# Patient Record
Sex: Male | Born: 1965
Health system: Southern US, Community
[De-identification: ages and names within clinical notes are randomized; demographics above are authoritative.]

## PROBLEM LIST (undated history)

## (undated) ENCOUNTER — Inpatient Hospital Stay: Payer: Self-pay | Admitting: *Deleted

## (undated) DIAGNOSIS — G4730 Sleep apnea, unspecified: Secondary | ICD-10-CM

## (undated) DIAGNOSIS — E2740 Unspecified adrenocortical insufficiency: Secondary | ICD-10-CM

## (undated) DIAGNOSIS — I5032 Chronic diastolic (congestive) heart failure: Secondary | ICD-10-CM

## (undated) DIAGNOSIS — Z923 Personal history of irradiation: Secondary | ICD-10-CM

## (undated) DIAGNOSIS — Z7189 Other specified counseling: Secondary | ICD-10-CM

## (undated) DIAGNOSIS — C4371 Malignant melanoma of right lower limb, including hip: Secondary | ICD-10-CM

## (undated) DIAGNOSIS — E785 Hyperlipidemia, unspecified: Secondary | ICD-10-CM

## (undated) DIAGNOSIS — M069 Rheumatoid arthritis, unspecified: Secondary | ICD-10-CM

## (undated) DIAGNOSIS — M109 Gout, unspecified: Secondary | ICD-10-CM

## (undated) DIAGNOSIS — I471 Supraventricular tachycardia: Secondary | ICD-10-CM

## (undated) DIAGNOSIS — F419 Anxiety disorder, unspecified: Secondary | ICD-10-CM

## (undated) DIAGNOSIS — T8859XA Other complications of anesthesia, initial encounter: Secondary | ICD-10-CM

## (undated) DIAGNOSIS — Z87442 Personal history of urinary calculi: Secondary | ICD-10-CM

## (undated) DIAGNOSIS — T4145XA Adverse effect of unspecified anesthetic, initial encounter: Secondary | ICD-10-CM

## (undated) HISTORY — PX: ABLATION: SHX5711

## (undated) HISTORY — DX: Supraventricular tachycardia: I47.1

## (undated) HISTORY — DX: Malignant melanoma of right lower limb, including hip: C43.71

## (undated) HISTORY — DX: Anxiety disorder, unspecified: F41.9

## (undated) HISTORY — PX: OTHER SURGICAL HISTORY: SHX169

## (undated) HISTORY — PX: EXTRACORPOREAL SHOCK WAVE LITHOTRIPSY: SHX1557

## (undated) HISTORY — PX: CHOLECYSTECTOMY: SHX55

## (undated) HISTORY — DX: Rheumatoid arthritis, unspecified: M06.9

---
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
## 2008-06-20 HISTORY — PX: ROTATOR CUFF REPAIR: SHX139

## 2012-03-16 ENCOUNTER — Encounter: Payer: Self-pay | Admitting: Internal Medicine

## 2012-05-24 ENCOUNTER — Encounter: Payer: Self-pay | Admitting: Internal Medicine

## 2012-05-24 ENCOUNTER — Telehealth: Payer: Self-pay | Admitting: *Deleted

## 2012-05-24 ENCOUNTER — Ambulatory Visit (INDEPENDENT_AMBULATORY_CARE_PROVIDER_SITE_OTHER): Payer: BC Managed Care – PPO | Admitting: Internal Medicine

## 2012-05-24 VITALS — BP 116/78 | HR 69 | Resp 18 | Ht 72.0 in | Wt 265.0 lb

## 2012-05-24 DIAGNOSIS — R0789 Other chest pain: Secondary | ICD-10-CM

## 2012-05-24 DIAGNOSIS — F419 Anxiety disorder, unspecified: Secondary | ICD-10-CM

## 2012-05-24 DIAGNOSIS — I471 Supraventricular tachycardia, unspecified: Secondary | ICD-10-CM

## 2012-05-24 DIAGNOSIS — R002 Palpitations: Secondary | ICD-10-CM

## 2012-05-24 DIAGNOSIS — F411 Generalized anxiety disorder: Secondary | ICD-10-CM

## 2012-05-24 HISTORY — DX: Supraventricular tachycardia: I47.1

## 2012-05-24 HISTORY — DX: Anxiety disorder, unspecified: F41.9

## 2012-05-24 HISTORY — DX: Supraventricular tachycardia, unspecified: I47.10

## 2012-05-24 NOTE — Progress Notes (Signed)
ELECTROPHYSIOLOGY CONSULT NOTE  Patient ID: James Byrd, MRN: [id number], DOB/AGE: 1966/06/13 46 y.o. Admit date: (Not on file) Date of Consult: 05/24/2012  Primary [address] Primary Cardiologist: none  Chief Complaint:  Chest pressure   HPI James Byrd is a 46 y.o. male  sees the request of Dr. Lorin Mercy because of ongoing problems with palpitations and pressure.  He has a 10 year history of episodes of chest discomfort which is limited to the middle of his chest associated with a balloon-like sensation lasting minutes and is unrelated to exertion. He has had a series of stress tests, treadmills, which has been unenlightening until September when he had an episode where his heart rate persisted at 190 beats per minute and then resolved.  Initially the patient to be that the sensation that he had at the treadmill, i.e. tachypalpitations, there is a reversible accompaniment of chest pressure. They were sitting in the room, he notes that he has recurrent chest pressure and I take his pulse and his heart is not racing.  There is no associated lightheadedness or diaphoresis with these spells. He has no significant exercise intolerance. He denies peripheral edema. He's had no syncope.  His electrocardiogram demonstrates Q waves V1-V3; no echocardiogram has been done. No past medical history on file.    Surgical History:  Past Surgical History  Procedure Date  . Rotator cuff repair 2010    3 yrs ago     Home Meds: Prior to Admission medications   Medication Sig Start Date End Date Taking? Authorizing Provider  Cholecalciferol (VITAMIN D) 2000 UNITS CAPS Take 2 capsules by mouth 2 (two) times daily.   Yes Historical Provider, MD  Coenzyme Q10 (CO Q 10) 10 MG CAPS Take by mouth.   Yes Historical Provider, MD  Cyanocobalamin (VITAMIN B 12 PO) Take by mouth.   Yes Historical Provider, MD  fish oil-omega-3 fatty acids 1000 MG capsule Take 2 g by mouth daily.   Yes  Historical Provider, MD  metoprolol succinate (TOPROL-XL) 50 MG 24 hr tablet Take 25 mg by mouth daily. Take with or immediately following a meal.   Yes Historical Provider, MD  Potassium (POTASSIMIN) 75 MG TABS Take 1 tablet by mouth daily.   Yes Historical Provider, MD      Allergies: No Known Allergies  History   Social History  . Marital Status: Married    Spouse Name: N/A    Number of Children: N/A  . Years of Education: N/A   Occupational History  . Not on file.   Social History Main Topics  . Smoking status: Former Research scientist (life sciences)  . Smokeless tobacco: Not on file  . Alcohol Use: Not on file  . Drug Use: Not on file  . Sexually Active: Not on file   Other Topics Concern  . Not on file   Social History Narrative  . No narrative on file     No family history on file.   ROS:  Please see the history of present illness.   Negative except stress  All other systems reviewed and negative.    Physical Exam:  Blood pressure 116/78, pulse 69, resp. rate 18, height 6' (1.829 m), weight 265 lb (120.203 kg). General: Well developed, well nourished male in no acute distress. Head: Normocephalic, atraumatic, sclera non-icteric, no xanthomas, nares are without discharge. Lymph Nodes:  none Back: without scoliosis/kyphosis , no CVA tendersness Neck: Negative for carotid bruits. JVD not elevated. Lungs: Clear bilaterally to auscultation without wheezes,  rales, or rhonchi. Breathing is unlabored. Heart: RRR with S1 S2. No murmur , rubs, or gallops appreciated. Abdomen: Soft, non-tender, non-distended with normoactive bowel sounds. No hepatomegaly. No rebound/guarding. No obvious abdominal masses. Msk:  Strength and tone appear normal for age. Extremities: No clubbing or cyanosis. No edema.  Distal pedal pulses are 2+ and equal bilaterally. Skin: Warm and Dry Neuro: Alert and oriented X 3. CN III-XII intact Grossly normal sensory and motor function . Psych:  Responds to questions  appropriately with an emotionally labile affect      L   EKG: Sinus rhythm at 68 Intervals 21/10/39 Axis 70 Q waves V1-V3   Assessment and Plan:   James Byrd

## 2012-05-24 NOTE — Assessment & Plan Note (Signed)
As above.

## 2012-05-24 NOTE — Patient Instructions (Signed)
Your physician has requested that you have an echocardiogram. Echocardiography is a painless test that uses sound waves to create images of your heart. It provides your doctor with information about the size and shape of your heart and how well your heart's chambers and valves are working. This procedure takes approximately one hour. There are no restrictions for this procedure.  Your physician has recommended that you wear a 30 day single event patient activated monitor. Event monitors are medical devices that record the heart's electrical activity. Doctors most often [place] these monitors to diagnose arrhythmias. Arrhythmias are problems with the speed or rhythm of the heartbeat. The monitor is a small, portable device. You can wear one while you do your normal daily activities. This is usually used to diagnose what is causing palpitations/syncope (passing out).  Your physician recommends that you schedule a follow-up appointment in: 6-8 weeks with Dr. Caryl Comes.

## 2012-05-24 NOTE — Telephone Encounter (Signed)
S/W patient, prefers monitor to be mailed to home address.  Pt enrolled 05/24/12.

## 2012-05-24 NOTE — Assessment & Plan Note (Signed)
The patient has stress issues related to work future and his son. I'm not sure how this plays into his symptom complex

## 2012-05-24 NOTE — Assessment & Plan Note (Signed)
This is described in the treadmill report. Initially his history suggested that t symptoms associated with that specific episode were universally associated with this chest pressure. It clearly is not the case. I'm not sure how frequently the SVT occurs or when this mechanism is. We will start by having him get the monitor from [facility] we'll also use a 30 day event recorder to try to clarify the rhythms associated with chest pressure and fluttering

## 2012-05-24 NOTE — Assessment & Plan Note (Signed)
He has an abnormal ECG suggestive of anteroseptal MI. Apparently none of this stress testing has been done with imaging. We'll undertake an echo. It may also be a component of this is GI in origin.

## 2012-06-01 ENCOUNTER — Ambulatory Visit (HOSPITAL_COMMUNITY): Payer: BC Managed Care – PPO | Attending: Internal Medicine | Admitting: Radiology

## 2012-06-01 DIAGNOSIS — I369 Nonrheumatic tricuspid valve disorder, unspecified: Secondary | ICD-10-CM | POA: Insufficient documentation

## 2012-06-01 DIAGNOSIS — I059 Rheumatic mitral valve disease, unspecified: Secondary | ICD-10-CM | POA: Insufficient documentation

## 2012-06-01 DIAGNOSIS — R0609 Other forms of dyspnea: Secondary | ICD-10-CM | POA: Insufficient documentation

## 2012-06-01 DIAGNOSIS — I471 Supraventricular tachycardia, unspecified: Secondary | ICD-10-CM | POA: Insufficient documentation

## 2012-06-01 DIAGNOSIS — R0989 Other specified symptoms and signs involving the circulatory and respiratory systems: Secondary | ICD-10-CM | POA: Insufficient documentation

## 2012-06-01 DIAGNOSIS — R002 Palpitations: Secondary | ICD-10-CM

## 2012-06-01 NOTE — Progress Notes (Signed)
Echocardiogram performed.  

## 2012-06-15 NOTE — Addendum Note (Signed)
Addended by: Lubertha Basque A on: 06/15/2012 04:52 PM   Modules accepted: Orders

## 2012-06-19 ENCOUNTER — Telehealth: Payer: Self-pay | Admitting: *Deleted

## 2012-06-19 NOTE — Telephone Encounter (Signed)
Patient called back. Notified of echo results.  Pt concerned because since taking Metoprolol he has been having problems with increased shortness of breath and his temper has been much shorter than usual.  He was also having problems of fatigue but was able to deal with this by taking 1/2 tablet of Metoprolol twice daily as opposed to 1 tablet daily.  Pt states he has called in strips also via his event monitor.  Advised patient to try to cut Metoprolol back to 1/2 tablet in the evening only to see if that helps with mood and shortness of breath.  Patient is aware that Dr Caryl Comes will be in Thursday to review charts.  Will discuss further with him at that time.  Pt aware and agrees with plan.

## 2012-06-19 NOTE — Telephone Encounter (Signed)
Left message for patient to call [id number] results.  According to [facility], event monitor was ordered 05-24-2012.  Want to verify that pt is wearing that.  Also need to verify pt is aware of follow up appt with Dr Caryl Comes in January.

## 2012-06-19 NOTE — Telephone Encounter (Signed)
Message copied by Patsey Berthold on Tue Jun 19, 2012  3:24 PM ------      Message from: Deboraha Sprang      Created: Fri Jun 08, 2012 10:47 AM       Please Inform Patient Normal echo except mild atral dilitation will follow that

## 2012-06-21 NOTE — Telephone Encounter (Signed)
Left message for patient to call.  D/W Dr Caryl Comes- event monitor shows SR, occasional PVC's.  If pt still not tolerating decreased dose of Metoprolol, okay to change to a different beta blocker.

## 2012-06-21 NOTE — Telephone Encounter (Signed)
Follow-up:    Patient called returning your call.  Please call back.

## 2012-06-22 ENCOUNTER — Telehealth: Payer: Self-pay | Admitting: Internal Medicine

## 2012-06-22 NOTE — Telephone Encounter (Signed)
New Problem:    Called in stating that they need a written request for the entire patient's stress test.  Please call back.

## 2012-06-22 NOTE — Telephone Encounter (Signed)
Spoke with patient.  He is feeling a little better with decreased dose of Metoprolol.  Advised about event results to date.  Advised Dr Caryl Comes is requesting full ECG report from stress test at [facility].  Pt will bring to appt 07-19-12.  Pt will call back with further questions.

## 2012-06-25 NOTE — Telephone Encounter (Signed)
Spoke with medical records.  They will assist patient in requesting records from [facility].

## 2012-06-25 NOTE — Telephone Encounter (Signed)
F/U   Returning call back to nurse [employer]

## 2012-06-27 ENCOUNTER — Telehealth: Payer: Self-pay | Admitting: Internal Medicine

## 2012-06-27 DIAGNOSIS — R002 Palpitations: Secondary | ICD-10-CM

## 2012-06-27 MED ORDER — ATENOLOL 25 MG PO TABS: ORAL_TABLET | ORAL | Status: DC

## 2012-06-27 NOTE — Telephone Encounter (Signed)
Pt not  doing well on decreased dose of metoprolol has no energy, still having constant pvc's, does not want to increase the dose of metoprolol does not like it, pls advise @ [phone]

## 2012-06-27 NOTE — Telephone Encounter (Signed)
Will review with  Dr. Caryl Comes prior to calling the patient to see if he recommends a different beta blocker to try. Records pulled for MD review.

## 2012-06-27 NOTE — Telephone Encounter (Signed)
Per Dr. Caryl Comes, GXT strips reviewed. Ok to switch to atenolol 25 mg once daily. I have explained this to the patient and he is aware and agreeable. I have asked that he give it at least 2 weeks on the atenolol as a trial.

## 2012-07-19 ENCOUNTER — Ambulatory Visit (INDEPENDENT_AMBULATORY_CARE_PROVIDER_SITE_OTHER): Payer: BC Managed Care – PPO | Admitting: Internal Medicine

## 2012-07-19 ENCOUNTER — Encounter: Payer: Self-pay | Admitting: Internal Medicine

## 2012-07-19 VITALS — BP 131/80 | HR 71 | Ht 72.0 in | Wt 269.4 lb

## 2012-07-19 DIAGNOSIS — R002 Palpitations: Secondary | ICD-10-CM

## 2012-07-19 DIAGNOSIS — F411 Generalized anxiety disorder: Secondary | ICD-10-CM

## 2012-07-19 DIAGNOSIS — G4733 Obstructive sleep apnea (adult) (pediatric): Secondary | ICD-10-CM

## 2012-07-19 DIAGNOSIS — F419 Anxiety disorder, unspecified: Secondary | ICD-10-CM

## 2012-07-19 DIAGNOSIS — I471 Supraventricular tachycardia: Secondary | ICD-10-CM

## 2012-07-19 NOTE — Patient Instructions (Signed)
Your physician has recommended you make the following change in your medication:  1) Stop atenolol.  Call Dr. Olin Pia nurse in 2 weeks and let us know how you are doing off atenolol.  Your physician wants you to follow-up in: 6 months with Dr. Caryl Comes. You will receive a reminder letter in the mail two months in advance. If you don't receive a letter, please call our office to schedule the follow-up appointment.

## 2012-07-19 NOTE — Progress Notes (Signed)
Patient has no care team.   HPI  James Byrd is a 47 y.o. male Seen in followup for palpitations last seen in 12/13 . He also had abnormal ECG suggestive of anteroseptal MI. Echocardiogram  12/13 was normal  He reportedly had SVT documented during a treadmill. We've been able to get to strips. The tachycardia is noted in the recovery phase likely related to a concealed accessory pathway.  This was asymptomatic. He was started on metoprolol. This was associated with irritability. We switched at his last visit to atenolol. This  has been much better.  He does complain however that now when he bends over to tie shoes he short of breath. He also notes shortness of breath without warning with a dog.  He also notes that work has been much more stressful. There've been some irritability.  Past Medical History  Diagnosis Date  . Paroxysmal supraventricular tachycardia 05/24/2012    Described in the treadmill report; details are pending   . Anxiety 05/24/2012    Past Surgical History  Procedure Date  . Rotator cuff repair 2010    3 yrs ago    Current Outpatient Prescriptions  Medication Sig Dispense Refill  . atenolol (TENORMIN) 25 MG tablet Take one tablet by mouth daily  180 tablet  3  . Coenzyme Q10 (CO Q 10) 10 MG CAPS Take by mouth.      . Cyanocobalamin (VITAMIN B 12 PO) Take by mouth.      . fish oil-omega-3 fatty acids 1000 MG capsule Take 2 g by mouth daily.      . Potassium (POTASSIMIN) 75 MG TABS Take 1 tablet by mouth daily.        No Known Allergies  Review of Systems negative except from HPI and PMH  Physical Exam BP 131/80  Pulse 71  Ht 6' (1.829 m)  Wt 269 lb 6.4 oz (122.199 kg)  BMI 36.54 kg/m2 Well developed and nourished in no acute distress HENT normal Neck supple with JVP-flat Clear Regular rate and rhythm, no murmurs or gallops Abd-soft with active BS No Clubbing cyanosis edema Skin-warm and dry A & Oriented  Grossly normal sensory and motor  function  Strips were reviewed demonstrating the onset of narrow QRS tachycardia without PR prolongation consistent with a concealed accessory pathway   Assessment and  Plan

## 2012-07-19 NOTE — Assessment & Plan Note (Signed)
As above.

## 2012-07-19 NOTE — Assessment & Plan Note (Signed)
As above I suspect some of his irritability may be related to depression and stress secondary to his new job. We'll see how he does office of his beta blocker

## 2012-07-19 NOTE — Assessment & Plan Note (Signed)
The patient has significant snoring and apnea. He also has daytime somnolence. He needs a sleep study. Of

## 2012-07-19 NOTE — Assessment & Plan Note (Signed)
Patient's tachycardia was asymptomatic. Hence we will discontinue his atenolol as it may be contributing to his shortness of breath and irritability. Event recorder failed to demonstrate any significant other arrhythmia

## 2012-08-01 ENCOUNTER — Other Ambulatory Visit: Payer: Self-pay | Admitting: *Deleted

## 2012-08-01 DIAGNOSIS — R002 Palpitations: Secondary | ICD-10-CM

## 2014-06-20 HISTORY — PX: EXTRACORPOREAL SHOCK WAVE LITHOTRIPSY: SHX1557

## 2014-11-13 ENCOUNTER — Telehealth: Payer: Self-pay | Admitting: Hematology & Oncology

## 2014-11-13 NOTE — Telephone Encounter (Signed)
Left vm w NEW PATIENT today to remind them of their appointment with Dr. Ennever. Also, advised them to bring all medication bottles and insurance card information. ° °

## 2014-11-14 ENCOUNTER — Encounter: Payer: Self-pay | Admitting: Hematology & Oncology

## 2014-11-14 ENCOUNTER — Ambulatory Visit (HOSPITAL_BASED_OUTPATIENT_CLINIC_OR_DEPARTMENT_OTHER): Payer: BLUE CROSS/BLUE SHIELD | Admitting: Hematology & Oncology

## 2014-11-14 ENCOUNTER — Ambulatory Visit: Payer: BLUE CROSS/BLUE SHIELD

## 2014-11-14 ENCOUNTER — Other Ambulatory Visit (HOSPITAL_BASED_OUTPATIENT_CLINIC_OR_DEPARTMENT_OTHER): Payer: BLUE CROSS/BLUE SHIELD

## 2014-11-14 VITALS — BP 119/75 | HR 76 | Temp 97.9°F | Resp 18 | Wt 274.0 lb

## 2014-11-14 DIAGNOSIS — C4371 Malignant melanoma of right lower limb, including hip: Secondary | ICD-10-CM

## 2014-11-14 HISTORY — DX: Malignant melanoma of right lower limb, including hip: C43.71

## 2014-11-14 LAB — CMP (CANCER CENTER ONLY)
ALT(SGPT): 31 U/L (ref 10–47)
AST: 20 U/L (ref 11–38)
Albumin: 4.1 g/dL (ref 3.3–5.5)
Alkaline Phosphatase: 51 U/L (ref 26–84)
BUN: 17 mg/dL (ref 7–22)
CALCIUM: 9.4 mg/dL (ref 8.0–10.3)
CHLORIDE: 106 meq/L (ref 98–108)
CO2: 27 mEq/L (ref 18–33)
CREATININE: 0.5 mg/dL — AB (ref 0.6–1.2)
GLUCOSE: 92 mg/dL (ref 73–118)
Potassium: 3.9 mEq/L (ref 3.3–4.7)
Sodium: 142 mEq/L (ref 128–145)
TOTAL PROTEIN: 7.2 g/dL (ref 6.4–8.1)
Total Bilirubin: 0.9 mg/dl (ref 0.20–1.60)

## 2014-11-14 LAB — CBC WITH DIFFERENTIAL (CANCER CENTER ONLY)
BASO#: 0 10*3/uL (ref 0.0–0.2)
BASO%: 0.3 % (ref 0.0–2.0)
EOS ABS: 0.1 10*3/uL (ref 0.0–0.5)
EOS%: 2 % (ref 0.0–7.0)
HEMATOCRIT: 40.5 % (ref 38.7–49.9)
HGB: 14.4 g/dL (ref 13.0–17.1)
LYMPH#: 1.7 10*3/uL (ref 0.9–3.3)
LYMPH%: 25.8 % (ref 14.0–48.0)
MCH: 32.7 pg (ref 28.0–33.4)
MCHC: 35.6 g/dL (ref 32.0–35.9)
MCV: 92 fL (ref 82–98)
MONO#: 0.7 10*3/uL (ref 0.1–0.9)
MONO%: 10.4 % (ref 0.0–13.0)
NEUT#: 4.1 10*3/uL (ref 1.5–6.5)
NEUT%: 61.5 % (ref 40.0–80.0)
PLATELETS: 211 10*3/uL (ref 145–400)
RBC: 4.41 10*6/uL (ref 4.20–5.70)
RDW: 12.7 % (ref 11.1–15.7)
WBC: 6.7 10*3/uL (ref 4.0–10.0)

## 2014-11-14 LAB — LACTATE DEHYDROGENASE: LDH: 163 U/L (ref 94–250)

## 2014-11-14 NOTE — Progress Notes (Signed)
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 : I had my right toe taken off because of melanoma.  HPI: Mr. Chilcoat is a very nice 49 year old white gentleman. He's been in very good health. He had an episode of paroxysmal SVT. This was back in 2014. He currently is on diltiazem.  He noted a discoloration under the nail bed of his right great toe about a year ago. It was nontender. He had no swelling area and he was treated for a fungal infection. This did not improve the discoloration.  The discoloration worsened. The nail bed began to crack.  He was then in to see a podiatrist. The podiatrist took off the toenail and did a biopsy. Shockingly enough, the biopsy report showed a malignant melanoma. The Breslow depth was 2.65 mm. There was vascular invasion. The margins were involved.  The biopsy was done in March 2018.  He subsequently went to [facility]. He did see a surgeon there. The surgeon went ahead and did a amputation of the right great toe. The pathology report from [facility] showed that he did have a sentinel lymph node that was positive.  He did have CT of the chest abdomen and pelvis prior to surgery. This was negative for any obvious metastatic disease. He did have a left hepatic lobe lesion which was felt to be a cyst.  By his pathology, he is stage IIIB [id number]) melanoma.  He was told by the surgeon that he did not need anything else done.  He did see one of the oncologists at about his. This was before he had the amputation and sentinel node biopsy. He has not gone back to see him yet.  He has seen Dr. Hinton Rao at [facility]. She recommended adjuvant Yervoy.  I take care of one of their friends. They wished to have a second opinion.  He is in good health. He is still working. He does vending machines. He has had no weight loss or weight gain. He's had no fever sweats or chills.  He's had no abdominal pain. He's had no change in bowel or bladder habits.  He currently is out of work from his surgical procedures.  He does not smoke.  There is no history of melanoma in the family.  Overall, his performance status is ECOG 0.       Past Medical History  Diagnosis Date  . Paroxysmal supraventricular tachycardia 05/24/2012    Described in the treadmill report; details are pending   . Anxiety 05/24/2012  :  Past Surgical History  Procedure Laterality Date  . Rotator cuff repair  2010    3 yrs ago  :   Current outpatient prescriptions:  .  aspirin 81 MG tablet, Take 81 mg by mouth daily., Disp: , Rfl:  .  diltiazem (CARDIZEM) 120 MG tablet, Take 120 mg by mouth daily., Disp: , Rfl:  .  Multiple Vitamins-Minerals (MENS ONE DAILY PO), Take by mouth., Disp: , Rfl:  .  pravastatin (PRAVACHOL) 40 MG tablet, Take 40 mg by mouth daily., Disp: , Rfl: :  :  No Known Allergies:  History reviewed. No pertinent family history.:  History   Social History  . Marital Status: Married    Spouse Name: N/A  . Number of Children: N/A  . Years of Education: N/A   Occupational History  . Not on file.   Social History Main Topics  .  Smoking status: Never Smoker   . Smokeless tobacco: Not on file  . Alcohol Use: Not on file  . Drug Use: Not on file  . Sexual Activity: Not on file   Other Topics Concern  . Not on file   Social History Narrative  :  Pertinent items are noted in HPI.  Exam: @IPVITALS @ Well developed and well-nourished white gentleman in no obvious distress. Vital signs show temperature of 97.9. Pulse 76. Blood pressure 119/75. Weight is 274 pounds. Head and neck exam shows no ocular or oral lesions. He has no palpable cervical or supraclavicular lymph nodes. Lungs are clear bilaterally. Cardiac exam regular rate and rhythm with no murmurs, rubs or bruits. Abdomen is soft. He has good bowel sounds. There is no fluid wave. There is no palpable  liver or spleen tip. Back exam shows no tenderness over the spine, ribs or hips. Inguinal exam shows the right sentinel lymph node biopsy. This is healing. Extremities shows the disarticulation of the right hallux. This is healing. No suspicious lesions are noted on his legs. Skin exam shows no hyperpigmented lesions that were suspicious. Neurological exam shows no focal neurological deficits.    Recent Labs  11/14/14 1511  WBC 6.7  HGB 14.4  HCT 40.5  PLT 211    Recent Labs  11/14/14 1512  NA 142  K 3.9  CL 106  CO2 27  GLUCOSE 92  BUN 17  CREATININE 0.5*  CALCIUM 9.4    Blood smear review:  none   Pathology: see above   Assessment and Plan:  Mr. Herndon is a really nice 49 year old gentleman. He has a stage IIIB subungual melanoma of the right great toe. He had this capitated. He had a sentinel lymph node with microscopic involvement.  I spent over an hour with he and his wife. I told him that in general, subungual melanomas tend to have a worse prognosis because they tend to be diagnosed later on in there course. This is exactly how it happened for Mr. Ajello. He said he had this lesion for about a year.  I told Mr. Szeto that he is healthy area and I totally agree with Dr. Hinton Rao about the role of adjuvant therapy.  I don't see that he needs to have a completion lymphadenectomy. I think one of the recent melanoma lymph node trials did not show a benefit for the immediate lymphadenectomy in patients with positive sentinel nodes.  I agree with Dr. Hinton Rao that Curt Bears would be a very good idea and would be tolerable. I told him that they course of therapy would only be 12 weeks. I think this would be much easier for him than a year of interferon.  I told him that even with [facility], that there is still a risk that the melanoma can come back.  I went over the toxicities of treatment with Mr. Berthelsen and his wife. Dr. Hinton Rao has done a great job with this already.  He would like  to have his treatment in our office. He has a very strong faith. We gave him a prayer blanket.  We will try to get treatment started next week.  I will plan to see him back for his second cycle of treatment.  I told him that I would speak with the oncologist that he saw at [facility], Dr. Baltazar Najjar and let him know what we spoke about and see if there is any additional input that he would like to give [place].

## 2014-11-18 ENCOUNTER — Telehealth: Payer: Self-pay | Admitting: Hematology & Oncology

## 2014-11-18 NOTE — Telephone Encounter (Signed)
ipilimumab Curt Bears) [id number] - APPROVED w [employer]  I spoke w Danae Chen @ [phone]  Auth: [id number] Valid: 11/18/2014 - 11/18/2015   P: [id number] F: [id number]   COPY SCANNED

## 2014-11-21 ENCOUNTER — Ambulatory Visit (HOSPITAL_BASED_OUTPATIENT_CLINIC_OR_DEPARTMENT_OTHER): Payer: BLUE CROSS/BLUE SHIELD

## 2014-11-21 ENCOUNTER — Other Ambulatory Visit: Payer: Self-pay

## 2014-11-21 VITALS — BP 122/74 | HR 55 | Temp 97.9°F | Resp 18

## 2014-11-21 DIAGNOSIS — Z5112 Encounter for antineoplastic immunotherapy: Secondary | ICD-10-CM | POA: Diagnosis not present

## 2014-11-21 DIAGNOSIS — C4371 Malignant melanoma of right lower limb, including hip: Secondary | ICD-10-CM

## 2014-11-21 MED ORDER — SODIUM CHLORIDE 0.9 % IV SOLN
3.2000 mg/kg | Freq: Once | INTRAVENOUS | Status: AC
  Administered 2014-11-21: 400 mg via INTRAVENOUS
  Filled 2014-11-21: qty 80

## 2014-11-21 MED ORDER — LIDOCAINE-PRILOCAINE 2.5-2.5 % EX CREA: TOPICAL_CREAM | CUTANEOUS | Status: DC

## 2014-11-21 MED ORDER — PREDNISONE 10 MG PO TABS: ORAL_TABLET | ORAL | Status: DC

## 2014-11-21 MED ORDER — SODIUM CHLORIDE 0.9 % IV SOLN
Freq: Once | INTRAVENOUS | Status: AC
  Administered 2014-11-21: 12:00:00 via INTRAVENOUS

## 2014-11-21 NOTE — Patient Instructions (Addendum)
Ipilimumab injection What is this medicine? IPILIMUMAB (IP i LIM ue mab) is used to treat certain types of melanoma. This medicine may be used for other purposes; ask your health care provider or pharmacist if you have questions. COMMON BRAND NAME(S): YERVOY What should I tell my health care provider before I take this medicine? They need to know if you have any of these conditions: -Addison's disease -blood in your stools (black or tarry stools) or if you have blood in your vomit -eye disease, vision problems -history of pancreatitis -history of stomach bleeding -immune system problems -inflammatory bowel disease -kidney disease -liver disease -lupus -myasthenia gravis -organ transplant -rheumatoid arthritis -sarcoidosis -stomach or intestine problems -thyroid disease -tingling of the fingers or toes, or other nerve disorder -an unusual or allergic reaction to ipilimumab, other medicines, foods, dyes, or preservatives -pregnant or trying to get pregnant -breast-feeding How should I use this medicine? This medicine is for infusion into a vein. It is given by a health care professional in a hospital or clinic setting. A special MedGuide will be given to you before each treatment. Be sure to read this information carefully each time. Talk to your pediatrician regarding the use of this medicine in children. Special care may be needed. Overdosage: If you think you've taken too much of this medicine contact a poison control center or emergency room at once. Overdosage: If you think you have taken too much of this medicine contact a poison control center or emergency room at once. NOTE: This medicine is only for you. Do not share this medicine with others. What if I miss a dose? It is important not to miss your dose. Call your doctor or health care professional if you are unable to keep an appointment. What may interact with this medicine? Interactions are not expected. This list may  not describe all possible interactions. Give your health care provider a list of all the medicines, herbs, non-prescription drugs, or dietary supplements you use. Also tell them if you smoke, drink alcohol, or use illegal drugs. Some items may interact with your medicine. What should I watch for while using this medicine? Tell your doctor or healthcare professional if your symptoms do not start to get better or if they get worse. Your condition will be monitored carefully while you are receiving this medicine. You may need blood work done while you are taking this medicine. What side effects may I notice from receiving this medicine? Side effects that you should report to your doctor or health care professional as soon as possible: -allergic reactions like skin rash, itching or hives, swelling of the face, lips, or tongue -black, tarry stools -bloody or watery diarrhea -changes in vision -dark urine -dizziness -eye pain -fast, irregular heartbeat -feeling anxious -feeling faint or lightheaded, falls -general ill feeling or flu-like symptoms -light-colored stools -loss of appetite -nausea, vomiting -pain, tingling, numbness in the hands or feet -redness, blistering, peeling or loosening of the skin, including inside the mouth -right upper belly pain -unusual bleeding or bruising -unusually weak or tired -yellowing of the eyes or skin Side effects that usually do not require medical attention (Report these to your doctor or health care professional if they continue or are bothersome.): -headache This list may not describe all possible side effects. Call your doctor for medical advice about side effects. You may report side effects to FDA at 1-800-FDA-1088. Where should I keep my medicine? This drug is given in a hospital or clinic and will not be  stored at home. NOTE: This sheet is a summary. It may not cover all possible information. If you have questions about this medicine, talk to  your doctor, pharmacist, or health care provider.  2015, [employer]vier/Gold Standard. (2012-01-30 17:21:29)    For any signs and symptoms of diarrhea: Immediately start 80 mg (8 tablets) by mouth daily for 3 days  Reduce by 10 mg for 3 days thereafter:  70 mg (7 tablets) by mouth daily for 3 days  60 mg (6 tablets) by mouth daily for 3 days  50 mg (5 tablets) by mouth daily for 3 days  40 mg (4 tablets) by mouth daily for 3 days  30 mg (3 tablets) by mouth daily for 3 days  20 mg (2 tablets) by mouth daily for 3 days  10 mg (1 tablet) by mouth daily for 3 days  5 mg (1/2 tablet) by mouth daily for 3 days.

## 2014-11-24 ENCOUNTER — Telehealth: Payer: Self-pay | Admitting: *Deleted

## 2014-11-24 NOTE — Telephone Encounter (Signed)
Spoke with patient regarding his chemotherapy last week. He is doing well. He has no side effects and feels like everything is going well.  He isn't following the directions for his prednisone. He states he doesn't like the side effects. Chemotherapy and the importance of medication compliance reviewed. He states he will talk to Dr Marin Olp next time to see if something else can be prescribed or have smaller doses prescribed.   Patient didn't have any further questions or concerns. He is aware to call the office with any problems or concerns.

## 2014-12-05 ENCOUNTER — Other Ambulatory Visit: Payer: Self-pay | Admitting: Nurse Practitioner

## 2014-12-05 ENCOUNTER — Other Ambulatory Visit (HOSPITAL_BASED_OUTPATIENT_CLINIC_OR_DEPARTMENT_OTHER): Payer: BLUE CROSS/BLUE SHIELD

## 2014-12-05 ENCOUNTER — Ambulatory Visit (HOSPITAL_BASED_OUTPATIENT_CLINIC_OR_DEPARTMENT_OTHER): Payer: BLUE CROSS/BLUE SHIELD

## 2014-12-05 VITALS — BP 118/68 | HR 88 | Temp 100.4°F | Resp 16

## 2014-12-05 DIAGNOSIS — R197 Diarrhea, unspecified: Secondary | ICD-10-CM

## 2014-12-05 DIAGNOSIS — C4371 Malignant melanoma of right lower limb, including hip: Secondary | ICD-10-CM

## 2014-12-05 LAB — CBC WITH DIFFERENTIAL (CANCER CENTER ONLY)
BASO#: 0 10*3/uL (ref 0.0–0.2)
BASO%: 0.1 % (ref 0.0–2.0)
EOS%: 0.9 % (ref 0.0–7.0)
Eosinophils Absolute: 0.1 10*3/uL (ref 0.0–0.5)
HCT: 40.2 % (ref 38.7–49.9)
HGB: 14 g/dL (ref 13.0–17.1)
LYMPH#: 1.1 10*3/uL (ref 0.9–3.3)
LYMPH%: 10.2 % — ABNORMAL LOW (ref 14.0–48.0)
MCH: 32.3 pg (ref 28.0–33.4)
MCHC: 34.8 g/dL (ref 32.0–35.9)
MCV: 93 fL (ref 82–98)
MONO#: 0.9 10*3/uL (ref 0.1–0.9)
MONO%: 8.5 % (ref 0.0–13.0)
NEUT#: 8.5 10*3/uL — ABNORMAL HIGH (ref 1.5–6.5)
NEUT%: 80.3 % — AB (ref 40.0–80.0)
Platelets: 173 10*3/uL (ref 145–400)
RBC: 4.34 10*6/uL (ref 4.20–5.70)
RDW: 13.3 % (ref 11.1–15.7)
WBC: 10.6 10*3/uL — ABNORMAL HIGH (ref 4.0–10.0)

## 2014-12-05 LAB — CMP (CANCER CENTER ONLY)
ALT: 26 U/L (ref 10–47)
AST: 19 U/L (ref 11–38)
Albumin: 4 g/dL (ref 3.3–5.5)
Alkaline Phosphatase: 50 U/L (ref 26–84)
BUN, Bld: 12 mg/dL (ref 7–22)
CALCIUM: 8.6 mg/dL (ref 8.0–10.3)
CO2: 23 mEq/L (ref 18–33)
CREATININE: 0.9 mg/dL (ref 0.6–1.2)
Chloride: 103 mEq/L (ref 98–108)
Glucose, Bld: 112 mg/dL (ref 73–118)
Potassium: 3.6 mEq/L (ref 3.3–4.7)
Sodium: 134 mEq/L (ref 128–145)
TOTAL PROTEIN: 7 g/dL (ref 6.4–8.1)
Total Bilirubin: 1.2 mg/dl (ref 0.20–1.60)

## 2014-12-05 MED ORDER — LOPERAMIDE HCL 2 MG PO CAPS
ORAL_CAPSULE | ORAL | Status: AC
Start: 2014-12-05 — End: 2014-12-05
  Filled 2014-12-05: qty 2

## 2014-12-05 MED ORDER — SODIUM CHLORIDE 0.9 % IV SOLN
INTRAVENOUS | Status: DC
  Administered 2014-12-05: 12:00:00 via INTRAVENOUS

## 2014-12-05 MED ORDER — SODIUM CHLORIDE 0.9 % IV SOLN
40.0000 mg | Freq: Once | INTRAVENOUS | Status: AC
  Administered 2014-12-05: 40 mg via INTRAVENOUS
  Filled 2014-12-05: qty 4

## 2014-12-05 MED ORDER — LOPERAMIDE HCL 2 MG PO CAPS
4.0000 mg | ORAL_CAPSULE | Freq: Once | ORAL | Status: AC
  Administered 2014-12-05: 4 mg via ORAL

## 2014-12-05 NOTE — Patient Instructions (Signed)

## 2014-12-08 ENCOUNTER — Encounter (HOSPITAL_COMMUNITY): Payer: Self-pay

## 2014-12-08 ENCOUNTER — Other Ambulatory Visit: Payer: Self-pay | Admitting: Nurse Practitioner

## 2014-12-08 ENCOUNTER — Telehealth: Payer: Self-pay | Admitting: *Deleted

## 2014-12-08 ENCOUNTER — Emergency Department (HOSPITAL_COMMUNITY)
Admission: EM | Admit: 2014-12-08 | Discharge: 2014-12-08 | Disposition: A | Discharge status: Home / Self Care | Payer: BLUE CROSS/BLUE SHIELD | Attending: Emergency Medicine | Admitting: Emergency Medicine

## 2014-12-08 DIAGNOSIS — Z79899 Other long term (current) drug therapy: Secondary | ICD-10-CM | POA: Diagnosis not present

## 2014-12-08 DIAGNOSIS — H109 Unspecified conjunctivitis: Secondary | ICD-10-CM | POA: Diagnosis not present

## 2014-12-08 DIAGNOSIS — M2550 Pain in unspecified joint: Secondary | ICD-10-CM

## 2014-12-08 DIAGNOSIS — Z8659 Personal history of other mental and behavioral disorders: Secondary | ICD-10-CM | POA: Insufficient documentation

## 2014-12-08 DIAGNOSIS — Z7982 Long term (current) use of aspirin: Secondary | ICD-10-CM | POA: Insufficient documentation

## 2014-12-08 DIAGNOSIS — Z8679 Personal history of other diseases of the circulatory system: Secondary | ICD-10-CM | POA: Diagnosis not present

## 2014-12-08 DIAGNOSIS — C4371 Malignant melanoma of right lower limb, including hip: Secondary | ICD-10-CM | POA: Diagnosis not present

## 2014-12-08 DIAGNOSIS — M25562 Pain in left knee: Secondary | ICD-10-CM | POA: Diagnosis not present

## 2014-12-08 DIAGNOSIS — M7989 Other specified soft tissue disorders: Secondary | ICD-10-CM | POA: Diagnosis present

## 2014-12-08 DIAGNOSIS — R197 Diarrhea, unspecified: Secondary | ICD-10-CM | POA: Insufficient documentation

## 2014-12-08 DIAGNOSIS — M25572 Pain in left ankle and joints of left foot: Secondary | ICD-10-CM | POA: Diagnosis not present

## 2014-12-08 DIAGNOSIS — M25561 Pain in right knee: Secondary | ICD-10-CM | POA: Insufficient documentation

## 2014-12-08 DIAGNOSIS — M25571 Pain in right ankle and joints of right foot: Secondary | ICD-10-CM | POA: Insufficient documentation

## 2014-12-08 LAB — CBC WITH DIFFERENTIAL/PLATELET
Basophils Absolute: 0 10*3/uL (ref 0.0–0.1)
Basophils Relative: 0 % (ref 0–1)
EOS PCT: 0 % (ref 0–5)
Eosinophils Absolute: 0 10*3/uL (ref 0.0–0.7)
HEMATOCRIT: 36.5 % — AB (ref 39.0–52.0)
Hemoglobin: 11.9 g/dL — ABNORMAL LOW (ref 13.0–17.0)
Lymphocytes Relative: 6 % — ABNORMAL LOW (ref 12–46)
Lymphs Abs: 0.5 10*3/uL — ABNORMAL LOW (ref 0.7–4.0)
MCH: 30.2 pg (ref 26.0–34.0)
MCHC: 32.6 g/dL (ref 30.0–36.0)
MCV: 92.6 fL (ref 78.0–100.0)
MONO ABS: 0.4 10*3/uL (ref 0.1–1.0)
Monocytes Relative: 5 % (ref 3–12)
Neutro Abs: 7.8 10*3/uL — ABNORMAL HIGH (ref 1.7–7.7)
Neutrophils Relative %: 89 % — ABNORMAL HIGH (ref 43–77)
Platelets: 183 10*3/uL (ref 150–400)
RBC: 3.94 MIL/uL — ABNORMAL LOW (ref 4.22–5.81)
RDW: 13.3 % (ref 11.5–15.5)
WBC: 8.7 10*3/uL (ref 4.0–10.5)

## 2014-12-08 LAB — URINALYSIS, ROUTINE W REFLEX MICROSCOPIC
Bilirubin Urine: NEGATIVE
GLUCOSE, UA: 100 mg/dL — AB
Hgb urine dipstick: NEGATIVE
Ketones, ur: NEGATIVE mg/dL
LEUKOCYTES UA: NEGATIVE
NITRITE: NEGATIVE
PROTEIN: NEGATIVE mg/dL
Specific Gravity, Urine: 1.03 (ref 1.005–1.030)
Urobilinogen, UA: 0.2 mg/dL (ref 0.0–1.0)
pH: 5.5 (ref 5.0–8.0)

## 2014-12-08 LAB — COMPREHENSIVE METABOLIC PANEL
ALBUMIN: 3.4 g/dL — AB (ref 3.5–5.0)
ALT: 23 U/L (ref 17–63)
ANION GAP: 10 (ref 5–15)
AST: 17 U/L (ref 15–41)
Alkaline Phosphatase: 45 U/L (ref 38–126)
BUN: 22 mg/dL — ABNORMAL HIGH (ref 6–20)
CHLORIDE: 108 mmol/L (ref 101–111)
CO2: 23 mmol/L (ref 22–32)
CREATININE: 0.84 mg/dL (ref 0.61–1.24)
Calcium: 8.4 mg/dL — ABNORMAL LOW (ref 8.9–10.3)
GLUCOSE: 162 mg/dL — AB (ref 65–99)
Potassium: 4 mmol/L (ref 3.5–5.1)
Sodium: 141 mmol/L (ref 135–145)
Total Bilirubin: 0.4 mg/dL (ref 0.3–1.2)
Total Protein: 6.1 g/dL — ABNORMAL LOW (ref 6.5–8.1)

## 2014-12-08 LAB — I-STAT CG4 LACTIC ACID, ED: LACTIC ACID, VENOUS: 2 mmol/L (ref 0.5–2.0)

## 2014-12-08 LAB — SEDIMENTATION RATE: SED RATE: 20 mm/h — AB (ref 0–16)

## 2014-12-08 MED ORDER — PREDNISONE 10 MG PO TABS: ORAL_TABLET | ORAL | Status: DC

## 2014-12-08 MED ORDER — POLYMYXIN B-TRIMETHOPRIM 10000-0.1 UNIT/ML-% OP SOLN
1.0000 [drp] | OPHTHALMIC | Status: DC
  Administered 2014-12-08: 1 [drp] via OPHTHALMIC
  Filled 2014-12-08: qty 10

## 2014-12-08 NOTE — Telephone Encounter (Signed)
Contacted patient to see how he was doing after his symptom management visit this past Friday. His diarrhea has resolved. Overall he is feeling better than Friday. He is still having swelling in his knees and ankles. This is also much better than on Friday. He is using tylenol as pain management. Dr Marin Olp notified.   He is aware to call the office with any other problems or worsening symptoms.

## 2014-12-08 NOTE — Discharge Instructions (Signed)
Arthralgia °Your caregiver has diagnosed you as suffering from an arthralgia. Arthralgia means there is pain in a joint. This can come from many reasons including: °· Bruising the joint which causes soreness (inflammation) in the joint. °· Wear and tear on the joints which occur as we grow older (osteoarthritis). °· Overusing the joint. °· Various forms of arthritis. °· Infections of the joint. °Regardless of the cause of pain in your joint, most of these different pains respond to anti-inflammatory drugs and rest. The exception to this is when a joint is infected, and these cases are treated with antibiotics, if it is a bacterial infection. °HOME CARE INSTRUCTIONS  °· Rest the injured area for as long as directed by your caregiver. Then slowly start using the joint as directed by your caregiver and as the pain allows. Crutches as directed may be useful if the ankles, knees or hips are involved. If the knee was splinted or casted, continue use and care as directed. If an stretchy or elastic wrapping bandage has been applied today, it should be removed and re-applied every 3 to 4 hours. It should not be applied tightly, but firmly enough to keep swelling down. Watch toes and feet for swelling, bluish discoloration, coldness, numbness or excessive pain. If any of these problems (symptoms) occur, remove the ace bandage and re-apply more loosely. If these symptoms persist, contact your caregiver or return to this location. °· For the first 24 hours, keep the injured extremity elevated on pillows while lying down. °· Apply ice for 15-20 minutes to the sore joint every couple hours while awake for the first half day. Then 03-04 times per day for the first 48 hours. Put the ice in a plastic bag and place a towel between the bag of ice and your skin. °· Wear any splinting, casting, elastic bandage applications, or slings as instructed. °· Only take over-the-counter or prescription medicines for pain, discomfort, or fever as  directed by your caregiver. Do not use aspirin immediately after the injury unless instructed by your physician. Aspirin can cause increased bleeding and bruising of the tissues. °· If you were given crutches, continue to use them as instructed and do not resume weight bearing on the sore joint until instructed. °Persistent pain and inability to use the sore joint as directed for more than 2 to 3 days are warning signs indicating that you should see a caregiver for a follow-up visit as soon as possible. Initially, a hairline fracture (break in bone) may not be evident on X-rays. Persistent pain and swelling indicate that further evaluation, non-weight bearing or use of the joint (use of crutches or slings as instructed), or further X-rays are indicated. X-rays may sometimes not show a small fracture until a week or 10 days later. Make a follow-up appointment with your own caregiver or one to whom we have referred you. A radiologist (specialist in reading X-rays) may read your X-rays. Make sure you know how you are to obtain your X-ray results. Do not assume everything is normal if you do not hear from us. °SEEK MEDICAL CARE IF: °Bruising, swelling, or pain increases. °SEEK IMMEDIATE MEDICAL CARE IF:  °· Your fingers or toes are numb or blue. °· The pain is not responding to medications and continues to stay the same or get worse. °· The pain in your joint becomes severe. °· You develop a fever over 102° F (38.9° C). °· It becomes impossible to move or use the joint. °MAKE SURE YOU:  °·   Understand these instructions.  Will watch your condition.  Will get help right away if you are not doing well or get worse. Document Released: 06/06/2005 Document Revised: 08/29/2011 Document Reviewed: 01/23/2008 [facility] Patient Information 2015 [place], [place]. This information is not intended to replace advice given to you by your health care provider. Make sure you discuss any questions you have with your health care  provider.  Conjunctivitis Conjunctivitis is commonly called "pink eye." Conjunctivitis can be caused by bacterial or viral infection, allergies, or injuries. There is usually redness of the lining of the eye, itching, discomfort, and sometimes discharge. There may be deposits of matter along the eyelids. A viral infection usually causes a watery discharge, while a bacterial infection causes a yellowish, thick discharge. Pink eye is very contagious and spreads by direct contact. You may be given antibiotic eyedrops as part of your treatment. Before using your eye medicine, remove all drainage from the eye by washing gently with warm water and cotton balls. Continue to use the medication until you have awakened 2 mornings in a row without discharge from the eye. Do not rub your eye. This increases the irritation and helps spread infection. Use separate towels from other household members. Wash your hands with soap and water before and after touching your eyes. Use cold compresses to reduce pain and sunglasses to relieve irritation from light. Do not wear contact lenses or wear eye makeup until the infection is gone. SEEK MEDICAL CARE IF:   Your symptoms are not better after 3 days of treatment.  You have increased pain or trouble seeing.  The outer eyelids become very red or swollen. Document Released: 07/14/2004 Document Revised: 08/29/2011 Document Reviewed: 06/06/2005 [facility] Patient Information 2015 [place], [place]. This information is not intended to replace advice given to you by your health care provider. Make sure you discuss any questions you have with your health care provider.

## 2014-12-08 NOTE — ED Provider Notes (Signed)
CSN: [id number]     Arrival date & time 12/08/14  1946 History   First MD Initiated Contact with Patient 12/08/14 2031     Chief Complaint  Patient presents with  . Leg Swelling     (Consider location/radiation/quality/duration/timing/severity/associated sxs/prior Treatment) HPI Comments: Patient presents to the ER for evaluation of pain in his knees and ankles. Patient reports that he has noticed swelling in the joints today. Patient was recently diagnosed with malignant melanoma of his right toe, is currently receiving chemotherapy. His last dose was 3 weeks ago. Patient was seen at the cancer center 3 days ago for low-grade fever and diarrhea, was given IV fluids and a dose of Decadron. He has not had any further fever. Reports that the diarrhea has improved.   Past Medical History  Diagnosis Date  . Paroxysmal supraventricular tachycardia 05/24/2012    Described in the treadmill report; details are pending   . Anxiety 05/24/2012  . Malignant melanoma of right great toe 11/14/2014   Past Surgical History  Procedure Laterality Date  . Rotator cuff repair  2010    3 yrs ago   No family history on file. History  Substance Use Topics  . Smoking status: Never Smoker   . Smokeless tobacco: Not on file  . Alcohol Use: Not on file    Review of Systems  Gastrointestinal: Positive for diarrhea.  Musculoskeletal: Positive for joint swelling and arthralgias.  All other systems reviewed and are negative.     Allergies  Review of patient's allergies indicates no known allergies.  Home Medications   Prior to Admission medications   Medication Sig Start Date End Date Taking? Authorizing Provider  aspirin 81 MG tablet Take 81 mg by mouth daily.   Yes Historical Provider, MD  diltiazem (CARDIZEM) 120 MG tablet Take 120 mg by mouth daily.   Yes Historical Provider, MD  lidocaine-prilocaine (EMLA) cream Apply to affected area once 11/21/14  Yes [name], MD  Multiple  Vitamins-Minerals (MENS ONE DAILY PO) Take 1 tablet by mouth daily.    Yes Historical Provider, MD  pravastatin (PRAVACHOL) 40 MG tablet Take 40 mg by mouth at bedtime.    Yes Historical Provider, MD  predniSONE (DELTASONE) 10 MG tablet Take as directed per [facility] Steroid Taper provided by Dr Marin Olp. Patient taking differently: Take as directed per [facility] Steroid Taper provided by Dr Marin Olp. Remaining Course: Take 3 tablets daily for 3 days, Take 2 tablets daily for 3 days and then Take 1 tablet daily for 3 days. 12/08/14  Yes [name], MD   BP 128/70 mmHg  Pulse 74  Temp(Src) 97.7 F (36.5 C) (Oral)  Resp 16  SpO2 93% Physical Exam  Constitutional: He is oriented to person, place, and time. He appears well-developed and well-nourished. No distress.  HENT:  Head: Normocephalic and atraumatic.  Right Ear: Hearing normal.  Left Ear: Hearing normal.  Nose: Nose normal.  Mouth/Throat: Oropharynx is clear and moist and mucous membranes are normal.  Eyes: EOM are normal. Pupils are equal, round, and reactive to light. Right conjunctiva is injected. Left conjunctiva is injected.  Neck: Normal range of motion. Neck supple.  Cardiovascular: Regular rhythm, S1 normal and S2 normal.  Exam reveals no gallop and no friction rub.   No murmur heard. Pulmonary/Chest: Effort normal and breath sounds normal. No respiratory distress. He exhibits no tenderness.  Abdominal: Soft. Normal appearance and bowel sounds are normal. There is no hepatosplenomegaly. There is no tenderness. There  is no rebound, no guarding, no tenderness at McBurney's point and negative Murphy's sign. No hernia.  Musculoskeletal: Normal range of motion.       Right knee: He exhibits normal range of motion and no effusion. Tenderness found.       Left knee: He exhibits normal range of motion and no effusion. Tenderness found.       Right ankle: He exhibits normal range of motion and no swelling. Tenderness.       Left ankle: He  exhibits normal range of motion and no swelling. Tenderness.  Neurological: He is alert and oriented to person, place, and time. He has normal strength. No cranial nerve deficit or sensory deficit. Coordination normal. GCS eye subscore is 4. GCS verbal subscore is 5. GCS motor subscore is 6.  Skin: Skin is warm, dry and intact. No rash noted. No cyanosis.  Psychiatric: He has a normal mood and affect. His speech is normal and behavior is normal. Thought content normal.  Nursing note and vitals reviewed.   ED Course  Procedures (including critical care time) Labs Review Labs Reviewed  CBC WITH DIFFERENTIAL/PLATELET - Abnormal; Notable for the following:    RBC 3.94 (*)    Hemoglobin 11.9 (*)    HCT 36.5 (*)    Neutrophils Relative % 89 (*)    Neutro Abs 7.8 (*)    Lymphocytes Relative 6 (*)    Lymphs Abs 0.5 (*)    All other components within normal limits  COMPREHENSIVE METABOLIC PANEL - Abnormal; Notable for the following:    Glucose, Bld 162 (*)    BUN 22 (*)    Calcium 8.4 (*)    Total Protein 6.1 (*)    Albumin 3.4 (*)    All other components within normal limits  URINALYSIS, ROUTINE W REFLEX MICROSCOPIC (NOT AT [facility]) - Abnormal; Notable for the following:    Glucose, UA 100 (*)    All other components within normal limits  SEDIMENTATION RATE - Abnormal; Notable for the following:    Sed Rate 20 (*)    All other components within normal limits  I-STAT CG4 LACTIC ACID, ED    Imaging Review No results found.   EKG Interpretation None      MDM   Final diagnoses:  Arthralgia  Bilateral conjunctivitis    Patient presents to the emergency department for evaluation of swelling of his legs. Patient feels like his knees and ankles are swollen. Examination, however, does not show any significant swelling. He does not have any joint effusions. There is no warmth, erythema or swelling of the overlying skin. There is nothing to indicate septic joints.  Was treated in  oncology 3 days ago for low-grade fever and diarrhea, given IV fluids. He also has been on stairs which is likely causing the swelling. He is not febrile today. Blood work is entirely normal.  Patient complaining of irritation of both eyes. He has redness and matting of his eyes. Examination did reveal conjunctivitis. This might be viral in nature, will treat with Polytrim. Patient is encouraged to follow-up with his oncologist primary care doctor for follow-up.    Orpah Greek, MD 12/08/14 [phone]

## 2014-12-08 NOTE — ED Notes (Signed)
Pt made aware needs to give urine sample, states once he drinks the water he will attempt.

## 2014-12-08 NOTE — ED Notes (Addendum)
Pt presents with c/o bilateral knee swelling and right ankle swelling. Pt reports he had chemo three weeks ago. Pt was running a fever last Friday and was given some IV fluids and steroids at the [facility] because pt was also experiencing some diarrhea. Pt reports that the swelling started last night, ambulatory to triage. Pt also c/o redness to both eyes.

## 2014-12-12 ENCOUNTER — Encounter: Payer: Self-pay | Admitting: Family

## 2014-12-12 ENCOUNTER — Ambulatory Visit (HOSPITAL_BASED_OUTPATIENT_CLINIC_OR_DEPARTMENT_OTHER): Payer: BLUE CROSS/BLUE SHIELD | Admitting: Family

## 2014-12-12 ENCOUNTER — Other Ambulatory Visit (HOSPITAL_BASED_OUTPATIENT_CLINIC_OR_DEPARTMENT_OTHER): Payer: BLUE CROSS/BLUE SHIELD

## 2014-12-12 ENCOUNTER — Ambulatory Visit (HOSPITAL_BASED_OUTPATIENT_CLINIC_OR_DEPARTMENT_OTHER): Payer: BLUE CROSS/BLUE SHIELD

## 2014-12-12 VITALS — BP 125/72 | HR 71 | Temp 97.6°F | Resp 16 | Ht 72.0 in | Wt 270.0 lb

## 2014-12-12 DIAGNOSIS — H109 Unspecified conjunctivitis: Secondary | ICD-10-CM | POA: Diagnosis not present

## 2014-12-12 DIAGNOSIS — C44702 Unspecified malignant neoplasm of skin of right lower limb, including hip: Secondary | ICD-10-CM

## 2014-12-12 DIAGNOSIS — C4371 Malignant melanoma of right lower limb, including hip: Secondary | ICD-10-CM

## 2014-12-12 LAB — CMP (CANCER CENTER ONLY)
ALBUMIN: 3.3 g/dL (ref 3.3–5.5)
ALT: 24 U/L (ref 10–47)
AST: 17 U/L (ref 11–38)
Alkaline Phosphatase: 52 U/L (ref 26–84)
BILIRUBIN TOTAL: 0.8 mg/dL (ref 0.20–1.60)
BUN, Bld: 23 mg/dL — ABNORMAL HIGH (ref 7–22)
CHLORIDE: 104 meq/L (ref 98–108)
CO2: 27 meq/L (ref 18–33)
Calcium: 8.6 mg/dL (ref 8.0–10.3)
Creat: 1 mg/dl (ref 0.6–1.2)
GLUCOSE: 120 mg/dL — AB (ref 73–118)
POTASSIUM: 3.6 meq/L (ref 3.3–4.7)
SODIUM: 142 meq/L (ref 128–145)
TOTAL PROTEIN: 6.2 g/dL — AB (ref 6.4–8.1)

## 2014-12-12 LAB — CBC WITH DIFFERENTIAL (CANCER CENTER ONLY)
BASO#: 0 10*3/uL (ref 0.0–0.2)
BASO%: 0.2 % (ref 0.0–2.0)
EOS%: 1 % (ref 0.0–7.0)
Eosinophils Absolute: 0.1 10*3/uL (ref 0.0–0.5)
HCT: 38.4 % — ABNORMAL LOW (ref 38.7–49.9)
HEMOGLOBIN: 13 g/dL (ref 13.0–17.1)
LYMPH#: 0.7 10*3/uL — AB (ref 0.9–3.3)
LYMPH%: 7.2 % — ABNORMAL LOW (ref 14.0–48.0)
MCH: 32 pg (ref 28.0–33.4)
MCHC: 33.9 g/dL (ref 32.0–35.9)
MCV: 95 fL (ref 82–98)
MONO#: 0.5 10*3/uL (ref 0.1–0.9)
MONO%: 4.4 % (ref 0.0–13.0)
NEUT%: 87.2 % — ABNORMAL HIGH (ref 40.0–80.0)
NEUTROS ABS: 8.8 10*3/uL — AB (ref 1.5–6.5)
Platelets: 234 10*3/uL (ref 145–400)
RBC: 4.06 10*6/uL — ABNORMAL LOW (ref 4.20–5.70)
RDW: 13.2 % (ref 11.1–15.7)
WBC: 10.1 10*3/uL — ABNORMAL HIGH (ref 4.0–10.0)

## 2014-12-12 LAB — TSH CHCC: TSH: 0.537 m(IU)/L (ref 0.320–4.118)

## 2014-12-12 LAB — LACTATE DEHYDROGENASE: LDH: 204 U/L (ref 94–250)

## 2014-12-12 MED ORDER — SULFACETAMIDE-PREDNISOLONE 10-0.23 % OP SOLN: 2.0000 [drp] | OPHTHALMIC | Status: DC

## 2014-12-12 MED ORDER — SODIUM CHLORIDE 0.9 % IV SOLN
Freq: Once | INTRAVENOUS | Status: AC
  Administered 2014-12-12: 11:00:00 via INTRAVENOUS

## 2014-12-12 MED ORDER — SODIUM CHLORIDE 0.9 % IV SOLN: 3.0000 mg/kg | Freq: Once | INTRAVENOUS | Status: DC

## 2014-12-12 NOTE — Progress Notes (Signed)
Hematology and Oncology Follow Up Visit  Andrue Guignard [id number] 03-09-1966 49 y.o. 12/12/2014   Principle Diagnosis:  Stage IIIB ([id number]) subungual melanoma of the right hallux  Current Therapy:   YERVOY q 21 days s/p cycle 1    Interim History:  Mr. Croswell is here today with his wife for follow-up and cycle 2 of YERVOY. He has conjunctivitis in both eyes. They are red and "watery." He had eye drop prescribed while in the ED but accidentally washed them in his pant and now can use them. He is still having diarrhea several times each day. He says it is getting a little more "formed" and let "watery." He has had some swelling in his ankles with the right ankle feeling a little painful at times. He has cut back his walking from 3 miles a day to 1. I told him to take a break and elevate his legs to see if this helps. There is no redness or edema in the ankles.  He has no numbness or tingling in his extremities.  No fever, chills, n/v, cough, rash, dizziness, SOB, chest pain, palpitations (no episodes of SVT), abdominal pain, constipation, diarrhea, blood in urine or stool.  He has a good appetite and is staying hydrated. His weight is stable.   Medications:    Medication List       This list is accurate as of: 12/12/14  1:42 PM.  Always use your most recent med list.               aspirin 81 MG tablet  Take 81 mg by mouth daily.     diltiazem 120 MG tablet  Commonly known as:  CARDIZEM  Take 120 mg by mouth daily.     lidocaine-prilocaine cream  Commonly known as:  EMLA  Apply to affected area once     MENS ONE DAILY PO  Take 1 tablet by mouth daily.     pravastatin 40 MG tablet  Commonly known as:  PRAVACHOL  Take 40 mg by mouth at bedtime.     predniSONE 10 MG tablet  Commonly known as:  DELTASONE  Take as directed per [facility] Steroid Taper provided by Dr Marin Olp.     sulfacetamide-prednisoLONE 10-0.23 % ophthalmic solution  Commonly known as:  VASOCIDIN  Place  2 drops into both eyes every 2 (two) hours while awake.        Allergies: No Known Allergies  Past Medical History, Surgical history, Social history, and Family History were reviewed and updated.  Review of Systems: All other 10 point review of systems is negative.   Physical Exam:  height is 6' (1.829 m) and weight is 270 lb (122.471 kg). His oral temperature is 97.6 F (36.4 C). His blood pressure is 125/72 and his pulse is 71. His respiration is 16.   Wt Readings from Last 3 Encounters:  12/12/14 270 lb (122.471 kg)  11/14/14 274 lb (124.286 kg)  07/19/12 269 lb 6.4 oz (122.199 kg)    Ocular: Sclerae unicteric, pupils equal, round and reactive to light Ear-nose-throat: Oropharynx clear, dentition fair Lymphatic: No cervical or supraclavicular adenopathy Lungs no rales or rhonchi, good excursion bilaterally Heart regular rate and rhythm, no murmur appreciated Abd soft, nontender, positive bowel sounds MSK no focal spinal tenderness, no joint edema Neuro: non-focal, well-oriented, appropriate affect Breasts: Deferred  Lab Results  Component Value Date   WBC 10.1* 12/12/2014   HGB 13.0 12/12/2014   HCT 38.4* 12/12/2014   MCV 95  12/12/2014   PLT 234 12/12/2014   No results found for: FERRITIN, IRON, TIBC, UIBC, IRONPCTSAT Lab Results  Component Value Date   RBC 4.06* 12/12/2014   No results found for: KPAFRELGTCHN, LAMBDASER, KAPLAMBRATIO No results found for: IGGSERUM, IGA, IGMSERUM No results found for: Odetta Pink, SPEI   Chemistry      Component Value Date/Time   NA 142 12/12/2014 1009   NA 141 12/08/2014 2105   K 3.6 12/12/2014 1009   K 4.0 12/08/2014 2105   CL 104 12/12/2014 1009   CL 108 12/08/2014 2105   CO2 27 12/12/2014 1009   CO2 23 12/08/2014 2105   BUN 23* 12/12/2014 1009   BUN 22* 12/08/2014 2105   CREATININE 1.0 12/12/2014 1009   CREATININE 0.84 12/08/2014 2105      Component Value  Date/Time   CALCIUM 8.6 12/12/2014 1009   CALCIUM 8.4* 12/08/2014 2105   ALKPHOS 52 12/12/2014 1009   ALKPHOS 45 12/08/2014 2105   AST 17 12/12/2014 1009   AST 17 12/08/2014 2105   ALT 24 12/12/2014 1009   ALT 23 12/08/2014 2105   BILITOT 0.80 12/12/2014 1009   BILITOT 0.4 12/08/2014 2105     Impression and Plan: Mr. Burghardt is a really nice 49 year old gentleman. He has a stage IIIB subungual melanoma of the right great toe. He had this portion of his toe removed and had a sentinel lymph node with microscopic involvement. He is now s/p cycle 1 of YERVOY. He has developed conjunctivitis in both eyes and is still having diarrhea several times a day.   After speaking with Jaclyn Shaggy our pharmacist, and going over up to date recommendations, it was agreed that we would hold his treatment today and treat his conjunctivitis. We will also give him fluids today.  He will put 2 drops of Vasocidin in each eye every 2 hours while awake.  We will plan to see him next Friday for labs, follow-up and resume treatment that day if his conjunctivitis and diarrhea have improved.   Eliezer Bottom, NP 6/24/20161:42 PM

## 2014-12-12 NOTE — Patient Instructions (Signed)

## 2014-12-17 ENCOUNTER — Ambulatory Visit (HOSPITAL_BASED_OUTPATIENT_CLINIC_OR_DEPARTMENT_OTHER): Payer: BLUE CROSS/BLUE SHIELD | Admitting: Family

## 2014-12-17 ENCOUNTER — Ambulatory Visit (HOSPITAL_BASED_OUTPATIENT_CLINIC_OR_DEPARTMENT_OTHER): Payer: BLUE CROSS/BLUE SHIELD | Admitting: Nurse Practitioner

## 2014-12-17 ENCOUNTER — Other Ambulatory Visit: Payer: Self-pay | Admitting: *Deleted

## 2014-12-17 VITALS — BP 107/63 | HR 86 | Temp 99.2°F | Resp 18 | Wt 265.0 lb

## 2014-12-17 DIAGNOSIS — M25579 Pain in unspecified ankle and joints of unspecified foot: Secondary | ICD-10-CM

## 2014-12-17 DIAGNOSIS — C4371 Malignant melanoma of right lower limb, including hip: Secondary | ICD-10-CM | POA: Diagnosis not present

## 2014-12-17 DIAGNOSIS — H109 Unspecified conjunctivitis: Secondary | ICD-10-CM | POA: Diagnosis not present

## 2014-12-17 DIAGNOSIS — C44702 Unspecified malignant neoplasm of skin of right lower limb, including hip: Secondary | ICD-10-CM | POA: Diagnosis not present

## 2014-12-17 LAB — CBC WITH DIFFERENTIAL (CANCER CENTER ONLY)
BASO#: 0 10*3/uL (ref 0.0–0.2)
BASO%: 0.1 % (ref 0.0–2.0)
EOS%: 1.4 % (ref 0.0–7.0)
Eosinophils Absolute: 0.1 10*3/uL (ref 0.0–0.5)
HCT: 38.8 % (ref 38.7–49.9)
HGB: 13 g/dL (ref 13.0–17.1)
LYMPH#: 0.9 10*3/uL (ref 0.9–3.3)
LYMPH%: 9.6 % — AB (ref 14.0–48.0)
MCH: 31.7 pg (ref 28.0–33.4)
MCHC: 33.5 g/dL (ref 32.0–35.9)
MCV: 95 fL (ref 82–98)
MONO#: 0.8 10*3/uL (ref 0.1–0.9)
MONO%: 8.1 % (ref 0.0–13.0)
NEUT%: 80.8 % — ABNORMAL HIGH (ref 40.0–80.0)
NEUTROS ABS: 7.9 10*3/uL — AB (ref 1.5–6.5)
Platelets: 220 10*3/uL (ref 145–400)
RBC: 4.1 10*6/uL — AB (ref 4.20–5.70)
RDW: 13.2 % (ref 11.1–15.7)
WBC: 9.8 10*3/uL (ref 4.0–10.0)

## 2014-12-17 LAB — COMPREHENSIVE METABOLIC PANEL (CC13)
ALBUMIN: 3.2 g/dL — AB (ref 3.5–5.0)
ALK PHOS: 56 U/L (ref 40–150)
ALT: 23 U/L (ref 0–55)
AST: 13 U/L (ref 5–34)
Anion Gap: 10 mEq/L (ref 3–11)
BUN: 14.9 mg/dL (ref 7.0–26.0)
CHLORIDE: 104 meq/L (ref 98–109)
CO2: 24 mEq/L (ref 22–29)
Calcium: 9.3 mg/dL (ref 8.4–10.4)
Creatinine: 0.8 mg/dL (ref 0.7–1.3)
Glucose: 90 mg/dl (ref 70–140)
POTASSIUM: 4.3 meq/L (ref 3.5–5.1)
SODIUM: 139 meq/L (ref 136–145)
Total Bilirubin: 0.59 mg/dL (ref 0.20–1.20)
Total Protein: 6.9 g/dL (ref 6.4–8.3)

## 2014-12-17 LAB — LACTATE DEHYDROGENASE: LDH: 204 U/L (ref 94–250)

## 2014-12-17 LAB — URIC ACID (CC13): Uric Acid, Serum: 4.3 mg/dl (ref 2.6–7.4)

## 2014-12-17 MED ORDER — FEBUXOSTAT 40 MG PO TABS: 40.0000 mg | ORAL_TABLET | Freq: Every day | ORAL | Status: DC

## 2014-12-17 MED ORDER — ESCITALOPRAM OXALATE 10 MG PO TABS: 10.0000 mg | ORAL_TABLET | Freq: Every day | ORAL | Status: DC

## 2014-12-17 MED ORDER — DEXAMETHASONE 4 MG PO TABS: ORAL_TABLET | ORAL | Status: DC

## 2014-12-17 NOTE — Progress Notes (Signed)
Hematology and Oncology Follow Up Visit  James Byrd [id number] Apr 10, 1966 49 y.o. 12/17/2014   Principle Diagnosis:  Stage IIIB (T3bN1aM0) subungual melanoma of the right hallux  Current Therapy:   YERVOY q 21 days s/p cycle 1    Interim History:  James Byrd is here today with his wife. He is not doing well. He is still experiencing frequent diarrhea. His conjunctivitis is no better despite being on the eye drops we gave him Friday. He is extremely depressed and discouraged.  His right elbow is now swollen and painful to the touch. His right ankle is also swollen and tender. He states that his right knee also becomes swollen at times but this appears to be down at this time.  He also had a fever this morning of 100.8. This has since broken. His temp at this time is 99.2.  He has no numbness or tingling in his extremities.  No chills, n/v, cough, rash, dizziness, SOB, chest pain, palpitations, abdominal pain, constipation, diarrhea, blood in urine or stool.  He has a good appetite but this is immediately followed by diarrhea. He is trying to stay hydrated. His weight is stable.   Medications:    Medication List       This list is accurate as of: 12/17/14 10:05 PM.  Always use your most recent med list.               aspirin 81 MG tablet  Take 81 mg by mouth daily.     dexamethasone 4 MG tablet  Commonly known as:  DECADRON  Take 5 pills a day for 4 days, then  Take 4 pills a day for 4 days, then Take 3 pills a day for 3 days, then Take 2 pills a day for 3 days, then Take 1 pill a day for 2 days     diltiazem 120 MG tablet  Commonly known as:  CARDIZEM  Take 120 mg by mouth daily.     escitalopram 10 MG tablet  Commonly known as:  LEXAPRO  Take 1 tablet (10 mg total) by mouth daily.     febuxostat 40 MG tablet  Commonly known as:  ULORIC  Take 1 tablet (40 mg total) by mouth daily.     lidocaine-prilocaine cream  Commonly known as:  EMLA  Apply to affected area  once     MENS ONE DAILY PO  Take 1 tablet by mouth daily.     pravastatin 40 MG tablet  Commonly known as:  PRAVACHOL  Take 40 mg by mouth at bedtime.     predniSONE 10 MG tablet  Commonly known as:  DELTASONE  Take as directed per [facility] Steroid Taper provided by Dr Marin Olp.     sulfacetamide-prednisoLONE 10-0.23 % ophthalmic solution  Commonly known as:  VASOCIDIN  Place 2 drops into both eyes every 2 (two) hours while awake.        Allergies: No Known Allergies  Past Medical History, Surgical history, Social history, and Family History were reviewed and updated.  Review of Systems: All other 10 point review of systems is negative.   Physical Exam:  weight is 265 lb (120.203 kg). His oral temperature is 99.2 F (37.3 C). His blood pressure is 107/63 and his pulse is 86. His respiration is 18.   Wt Readings from Last 3 Encounters:  12/17/14 265 lb (120.203 kg)  12/12/14 270 lb (122.471 kg)  11/14/14 274 lb (124.286 kg)    Ocular: Sclerae unicteric, pupils  equal, round and reactive to light Ear-nose-throat: Oropharynx clear, dentition fair Lymphatic: No cervical or supraclavicular adenopathy Lungs no rales or rhonchi, good excursion bilaterally Heart regular rate and rhythm, no murmur appreciated Abd soft, nontender, positive bowel sounds MSK no focal spinal tenderness, no joint edema Neuro: non-focal, well-oriented, appropriate affect Breasts: Deferred  Lab Results  Component Value Date   WBC 9.8 12/17/2014   HGB 13.0 12/17/2014   HCT 38.8 12/17/2014   MCV 95 12/17/2014   PLT 220 12/17/2014   No results found for: FERRITIN, IRON, TIBC, UIBC, IRONPCTSAT Lab Results  Component Value Date   RBC 4.10* 12/17/2014   No results found for: KPAFRELGTCHN, LAMBDASER, KAPLAMBRATIO No results found for: IGGSERUM, IGA, IGMSERUM No results found for: Kathrynn Ducking, MSPIKE, SPEI   Chemistry      Component Value Date/Time     NA 139 12/17/2014 1328   NA 142 12/12/2014 1009   NA 141 12/08/2014 2105   K 4.3 12/17/2014 1328   K 3.6 12/12/2014 1009   K 4.0 12/08/2014 2105   CL 104 12/12/2014 1009   CL 108 12/08/2014 2105   CO2 24 12/17/2014 1328   CO2 27 12/12/2014 1009   CO2 23 12/08/2014 2105   BUN 14.9 12/17/2014 1328   BUN 23* 12/12/2014 1009   BUN 22* 12/08/2014 2105   CREATININE 0.8 12/17/2014 1328   CREATININE 1.0 12/12/2014 1009   CREATININE 0.84 12/08/2014 2105      Component Value Date/Time   CALCIUM 9.3 12/17/2014 1328   CALCIUM 8.6 12/12/2014 1009   CALCIUM 8.4* 12/08/2014 2105   ALKPHOS 56 12/17/2014 1328   ALKPHOS 52 12/12/2014 1009   ALKPHOS 45 12/08/2014 2105   AST 13 12/17/2014 1328   AST 17 12/12/2014 1009   AST 17 12/08/2014 2105   ALT 23 12/17/2014 1328   ALT 24 12/12/2014 1009   ALT 23 12/08/2014 2105   BILITOT 0.59 12/17/2014 1328   BILITOT 0.80 12/12/2014 1009   BILITOT 0.4 12/08/2014 2105     Impression and Plan: James Byrd is a really nice 49 year old gentleman. He has a stage IIIB subungual melanoma of the right great toe. He had this portion of his toe removed and had a sentinel lymph node with microscopic involvement. With all the complications he has experienced, he as elected to stop treatment at this time.  We will have him start on Decadron today for his bilateral conjunctivitis. He has some Creon samples to try and see if this helps with his diarrhea.  We will also start him on Lexapro today for his depression and anxiety.  We added an uric acid to his lab work. For now we will go ahead and have him start taking Uloric for his swollen, painful joints. We will plan to see him back in 2 weeks for follow-up and labs. Both he and his wife know to call with any questions or concerns. We can certainly see him sooner if need be.  This was a share visit with Dr. Marin Olp.   James Bottom, NP 6/29/201610:05 PM   Addendum:   I had a very long talk with   James Byrd and his wife.Even though arthralgias are certainly not one of the immune mediated  Side effects to see with Yervoy,  I have to believe that this is somehow  Related  To his Yervoy. I think he has  Conjunctivitis from the [facility]. I just don't think that he will be able to  be treated again with [facility]. He is incredibly upset over the side effects that he has had. I really feel bad for him.he really has very little quality of life right now. He cannot work.  I don't think he has gout but I don't see any harm in treating him for this right now.  I will get him on some Decadron. I think that if the conjunctivitis clears up, then his arthralgias will heal up and he will feel much better.  At this point, I just don't see that any form of adjuvant therapy would be tolerated by him. He does have the microscopic positive sentinel nodes. I just wonder if radiation therapy might be indicated. I would hate to put her through a complete lymph node dissection for fear of lymphedema in his right leg.  I spent about 45 minutes with he and his wife.   I will pray hard for him.  I think that he has appointment to come back next week to see [place]. Hopefully he will start to feel better.  Lum Keas

## 2014-12-19 ENCOUNTER — Ambulatory Visit: Payer: BLUE CROSS/BLUE SHIELD | Admitting: Family

## 2014-12-19 ENCOUNTER — Ambulatory Visit: Payer: BLUE CROSS/BLUE SHIELD

## 2014-12-19 ENCOUNTER — Other Ambulatory Visit: Payer: BLUE CROSS/BLUE SHIELD

## 2014-12-19 ENCOUNTER — Telehealth: Payer: Self-pay | Admitting: Hematology & Oncology

## 2014-12-19 NOTE — Telephone Encounter (Signed)
Mailed calendar. °

## 2014-12-23 ENCOUNTER — Telehealth: Payer: Self-pay | Admitting: Hematology & Oncology

## 2014-12-23 NOTE — Telephone Encounter (Signed)
Contacted pt regarding 7/15 appt at 10:15. Pt confirmed appt

## 2014-12-26 ENCOUNTER — Telehealth: Payer: Self-pay | Admitting: *Deleted

## 2014-12-26 NOTE — Telephone Encounter (Signed)
Patient is concerned about weakness. He doesn't feel badly but his energy level is down. He is currently coming down on a steroid taper. After speaking to Dr Marin Olp, it's thought that the decreasing steroid dosing is causing his increased weakness. He understands and feels good about response. He will call the office if he has any further problems.

## 2014-12-29 ENCOUNTER — Encounter: Payer: Self-pay | Admitting: Nurse Practitioner

## 2014-12-29 ENCOUNTER — Ambulatory Visit: Payer: BLUE CROSS/BLUE SHIELD | Admitting: Hematology & Oncology

## 2014-12-29 ENCOUNTER — Other Ambulatory Visit: Payer: BLUE CROSS/BLUE SHIELD

## 2014-12-29 NOTE — Progress Notes (Signed)
Note confirming treatment regiment sent to [facility] as requested. Confirmation received [phone].

## 2014-12-30 ENCOUNTER — Other Ambulatory Visit: Payer: Self-pay | Admitting: Hematology & Oncology

## 2015-01-02 ENCOUNTER — Ambulatory Visit (HOSPITAL_BASED_OUTPATIENT_CLINIC_OR_DEPARTMENT_OTHER): Payer: BLUE CROSS/BLUE SHIELD

## 2015-01-02 ENCOUNTER — Encounter: Payer: Self-pay | Admitting: Hematology & Oncology

## 2015-01-02 ENCOUNTER — Other Ambulatory Visit (HOSPITAL_BASED_OUTPATIENT_CLINIC_OR_DEPARTMENT_OTHER): Payer: BLUE CROSS/BLUE SHIELD

## 2015-01-02 ENCOUNTER — Ambulatory Visit (HOSPITAL_BASED_OUTPATIENT_CLINIC_OR_DEPARTMENT_OTHER): Payer: BLUE CROSS/BLUE SHIELD | Admitting: Hematology & Oncology

## 2015-01-02 VITALS — BP 142/91 | HR 79 | Temp 98.0°F | Resp 18 | Ht 72.0 in | Wt 273.0 lb

## 2015-01-02 DIAGNOSIS — Z5112 Encounter for antineoplastic immunotherapy: Secondary | ICD-10-CM | POA: Diagnosis not present

## 2015-01-02 DIAGNOSIS — C4371 Malignant melanoma of right lower limb, including hip: Secondary | ICD-10-CM | POA: Diagnosis not present

## 2015-01-02 LAB — CBC WITH DIFFERENTIAL (CANCER CENTER ONLY)
BASO#: 0 10*3/uL (ref 0.0–0.2)
BASO%: 0.2 % (ref 0.0–2.0)
EOS%: 1.8 % (ref 0.0–7.0)
Eosinophils Absolute: 0.2 10*3/uL (ref 0.0–0.5)
HCT: 40.8 % (ref 38.7–49.9)
HEMOGLOBIN: 13.8 g/dL (ref 13.0–17.1)
LYMPH#: 1.5 10*3/uL (ref 0.9–3.3)
LYMPH%: 16.9 % (ref 14.0–48.0)
MCH: 31.6 pg (ref 28.0–33.4)
MCHC: 33.8 g/dL (ref 32.0–35.9)
MCV: 93 fL (ref 82–98)
MONO#: 0.9 10*3/uL (ref 0.1–0.9)
MONO%: 10.5 % (ref 0.0–13.0)
NEUT#: 6.1 10*3/uL (ref 1.5–6.5)
NEUT%: 70.6 % (ref 40.0–80.0)
Platelets: 119 10*3/uL — ABNORMAL LOW (ref 145–400)
RBC: 4.37 10*6/uL (ref 4.20–5.70)
RDW: 14.7 % (ref 11.1–15.7)
WBC: 8.7 10*3/uL (ref 4.0–10.0)

## 2015-01-02 LAB — LACTATE DEHYDROGENASE: LDH: 187 U/L (ref 94–250)

## 2015-01-02 LAB — CMP (CANCER CENTER ONLY)
ALBUMIN: 3.5 g/dL (ref 3.3–5.5)
ALT(SGPT): 30 U/L (ref 10–47)
AST: 21 U/L (ref 11–38)
Alkaline Phosphatase: 47 U/L (ref 26–84)
BILIRUBIN TOTAL: 0.8 mg/dL (ref 0.20–1.60)
BUN, Bld: 13 mg/dL (ref 7–22)
CALCIUM: 9.5 mg/dL (ref 8.0–10.3)
CO2: 30 meq/L (ref 18–33)
Chloride: 100 mEq/L (ref 98–108)
Creat: 1 mg/dl (ref 0.6–1.2)
Glucose, Bld: 90 mg/dL (ref 73–118)
Potassium: 3.6 mEq/L (ref 3.3–4.7)
Sodium: 138 mEq/L (ref 128–145)
Total Protein: 6.4 g/dL (ref 6.4–8.1)

## 2015-01-02 MED ORDER — SODIUM CHLORIDE 0.9 % IV SOLN
2.4000 mg/kg | Freq: Once | INTRAVENOUS | Status: AC
  Administered 2015-01-02: 300 mg via INTRAVENOUS
  Filled 2015-01-02: qty 60

## 2015-01-02 MED ORDER — DEXAMETHASONE SODIUM PHOSPHATE 100 MG/10ML IJ SOLN
20.0000 mg | Freq: Once | INTRAMUSCULAR | Status: AC
  Administered 2015-01-02: 20 mg via INTRAVENOUS
  Filled 2015-01-02: qty 2

## 2015-01-02 MED ORDER — PREDNISONE 10 MG PO TABS: ORAL_TABLET | ORAL | Status: DC

## 2015-01-02 MED ORDER — SODIUM CHLORIDE 0.9 % IV SOLN
Freq: Once | INTRAVENOUS | Status: AC
  Administered 2015-01-02: 12:00:00 via INTRAVENOUS

## 2015-01-02 MED ORDER — DEXAMETHASONE 4 MG PO TABS: ORAL_TABLET | ORAL | Status: DC

## 2015-01-02 NOTE — Patient Instructions (Addendum)
Ipilimumab injection What is this medicine? IPILIMUMAB (IP i LIM ue mab) is used to treat certain types of melanoma. This medicine may be used for other purposes; ask your health care provider or pharmacist if you have questions. COMMON BRAND NAME(S): YERVOY What should I tell my health care provider before I take this medicine? They need to know if you have any of these conditions: -Addison's disease -blood in your stools (black or tarry stools) or if you have blood in your vomit -eye disease, vision problems -history of pancreatitis -history of stomach bleeding -immune system problems -inflammatory bowel disease -kidney disease -liver disease -lupus -myasthenia gravis -organ transplant -rheumatoid arthritis -sarcoidosis -stomach or intestine problems -thyroid disease -tingling of the fingers or toes, or other nerve disorder -an unusual or allergic reaction to ipilimumab, other medicines, foods, dyes, or preservatives -pregnant or trying to get pregnant -breast-feeding How should I use this medicine? This medicine is for infusion into a vein. It is given by a health care professional in a hospital or clinic setting. A special MedGuide will be given to you before each treatment. Be sure to read this information carefully each time. Talk to your pediatrician regarding the use of this medicine in children. Special care may be needed. Overdosage: If you think you've taken too much of this medicine contact a poison control center or emergency room at once. Overdosage: If you think you have taken too much of this medicine contact a poison control center or emergency room at once. NOTE: This medicine is only for you. Do not share this medicine with others. What if I miss a dose? It is important not to miss your dose. Call your doctor or health care professional if you are unable to keep an appointment. What may interact with this medicine? Interactions are not expected. This list may  not describe all possible interactions. Give your health care provider a list of all the medicines, herbs, non-prescription drugs, or dietary supplements you use. Also tell them if you smoke, drink alcohol, or use illegal drugs. Some items may interact with your medicine. What should I watch for while using this medicine? Tell your doctor or healthcare professional if your symptoms do not start to get better or if they get worse. Your condition will be monitored carefully while you are receiving this medicine. You may need blood work done while you are taking this medicine. What side effects may I notice from receiving this medicine? Side effects that you should report to your doctor or health care professional as soon as possible: -allergic reactions like skin rash, itching or hives, swelling of the face, lips, or tongue -black, tarry stools -bloody or watery diarrhea -changes in vision -dark urine -dizziness -eye pain -fast, irregular heartbeat -feeling anxious -feeling faint or lightheaded, falls -general ill feeling or flu-like symptoms -light-colored stools -loss of appetite -nausea, vomiting -pain, tingling, numbness in the hands or feet -redness, blistering, peeling or loosening of the skin, including inside the mouth -right upper belly pain -unusual bleeding or bruising -unusually weak or tired -yellowing of the eyes or skin Side effects that usually do not require medical attention (Report these to your doctor or health care professional if they continue or are bothersome.): -headache This list may not describe all possible side effects. Call your doctor for medical advice about side effects. You may report side effects to FDA at 1-800-FDA-1088. Where should I keep my medicine? This drug is given in a hospital or clinic and will not be  stored at home. NOTE: This sheet is a summary. It may not cover all possible information. If you have questions about this medicine, talk to  your doctor, pharmacist, or health care provider.  2015, Elsevier/Gold Standard. (2012-01-30 17:21:29) [facility] Discharge Instructions for Patients Receiving Chemotherapy  Today you received the following chemotherapy agents:  Yervoy    BELOW ARE SYMPTOMS THAT SHOULD BE REPORTED IMMEDIATELY:  *FEVER GREATER THAN 100.5 F  *CHILLS WITH OR WITHOUT FEVER  NAUSEA AND VOMITING THAT IS NOT CONTROLLED WITH YOUR NAUSEA MEDICATION  *UNUSUAL SHORTNESS OF BREATH  *UNUSUAL BRUISING OR BLEEDING  TENDERNESS IN MOUTH AND THROAT WITH OR WITHOUT PRESENCE OF ULCERS  *URINARY PROBLEMS  *BOWEL PROBLEMS  UNUSUAL RASH Items with * indicate a potential emergency and should be followed up as soon as possible.  Feel free to call the clinic you have any questions or concerns. The clinic phone number is [phone].  Please show the [place] at check-in to the Emergency Department and triage nurse.

## 2015-01-02 NOTE — Progress Notes (Signed)
Hematology and Oncology Follow Up Visit  James Byrd [id number] 09-Nov-1965 49 y.o. 01/02/2015   Principle Diagnosis:   Stage IIIB (T3bN1aM0) subungual melanoma of the right hallux  Current Therapy:    Yervoy-status post cycle 1     Interim History:  James Byrd is back for follow-up. He really had a hard time with the first cycle treatment. We basically had to delay him for 3 weeks. He does have this very unusual reaction in which his joints were so swollen and painful. He finally responded to high-dose Decadron.  He also had incredible conjunctivitis. Again the high-dose Decadron to get a cure this.  We did put him on some Lexapro. He has not taken this. I told him that this was very important for him to take.  He had no diarrhea. He had no rashes. He really did not have the typical side effects that one sees with immunotherapy.  Again, he just was debilitated with arthralgias. Again, the Decadron helped quite a bit.  I'm going to go ahead and put him on some Decadron after this cycle of treatment. I run have to do this to try to get him through.  He had some low-grade temperatures.  He is not able to work right now.  Overall, his performance status is ECOG 1.  His appetite is doing better. He's had no cough or shortness of breath. He's had no bowel sores.  Medications:  Current outpatient prescriptions:  .  aspirin 81 MG tablet, Take 81 mg by mouth daily., Disp: , Rfl:  .  diltiazem (CARDIZEM) 120 MG tablet, Take 120 mg by mouth daily., Disp: , Rfl:  .  escitalopram (LEXAPRO) 10 MG tablet, Take 1 tablet (10 mg total) by mouth daily., Disp: 90 tablet, Rfl: 90 .  febuxostat (ULORIC) 40 MG tablet, Take 1 tablet (40 mg total) by mouth daily., Disp: 30 tablet, Rfl: 3 .  lidocaine-prilocaine (EMLA) cream, Apply to affected area once, Disp: 30 g, Rfl: 3 .  Multiple Vitamins-Minerals (MENS ONE DAILY PO), Take 1 tablet by mouth daily. , Disp: , Rfl:  .  pravastatin (PRAVACHOL) 40  MG tablet, Take 40 mg by mouth at bedtime. , Disp: , Rfl:  .  predniSONE (DELTASONE) 10 MG tablet, Take as directed per [facility] Steroid Taper provided by Dr Marin Olp. (Patient taking differently: Take as directed per [facility] Steroid Taper provided by Dr Marin Olp. Remaining Course: Take 3 tablets daily for 3 days, Take 2 tablets daily for 3 days and then Take 1 tablet daily for 3 days.), Disp: 120 tablet, Rfl: 1 .  sulfacetamide-prednisoLONE (VASOCIDIN) 10-0.23 % ophthalmic solution, Place 2 drops into both eyes every 2 (two) hours while awake., Disp: 10 mL, Rfl: 0  Allergies: No Known Allergies  Past Medical History, Surgical history, Social history, and Family History were reviewed and updated.  Review of Systems: As above  Physical Exam:  height is 6' (1.829 m) and weight is 273 lb (123.832 kg). His oral temperature is 98 F (36.7 C). His blood pressure is 142/91 and his pulse is 79. His respiration is 18.   Wt Readings from Last 3 Encounters:  01/02/15 273 lb (123.832 kg)  12/17/14 265 lb (120.203 kg)  12/12/14 270 lb (122.471 kg)     Well developed and well nourished white [name]. Head and neck exam shows no ocular or oral lesions. There might be some slight injectable inflammation, more so on the left eye. He has no adenopathy in the neck. Lungs are clear.  He has no rales, wheezes or rhonchi. Cardiac exam regular rate and rhythm with no murmurs, rubs or bruits. Abdomen is soft. He has good bowel sounds. There is no fluid wave. There is no palpable liver or spleen tip. Back exam shows no tenderness over the spine, ribs or hips. Extremities shows the amputated big toe of the right foot. There is no swelling in the right leg. Skin exam shows no rashes or ecchymosis or petechia. Neurological exam is nonfocal.  Lab Results  Component Value Date   WBC 8.7 01/02/2015   HGB 13.8 01/02/2015   HCT 40.8 01/02/2015   MCV 93 01/02/2015   PLT 119* 01/02/2015     Chemistry      Component Value  Date/Time   NA 138 01/02/2015 0947   NA 139 12/17/2014 1328   NA 141 12/08/2014 2105   K 3.6 01/02/2015 0947   K 4.3 12/17/2014 1328   K 4.0 12/08/2014 2105   CL 100 01/02/2015 0947   CL 108 12/08/2014 2105   CO2 30 01/02/2015 0947   CO2 24 12/17/2014 1328   CO2 23 12/08/2014 2105   BUN 13 01/02/2015 0947   BUN 14.9 12/17/2014 1328   BUN 22* 12/08/2014 2105   CREATININE 1.0 01/02/2015 0947   CREATININE 0.8 12/17/2014 1328   CREATININE 0.84 12/08/2014 2105      Component Value Date/Time   CALCIUM 9.5 01/02/2015 0947   CALCIUM 9.3 12/17/2014 1328   CALCIUM 8.4* 12/08/2014 2105   ALKPHOS 47 01/02/2015 0947   ALKPHOS 56 12/17/2014 1328   ALKPHOS 45 12/08/2014 2105   AST 21 01/02/2015 0947   AST 13 12/17/2014 1328   AST 17 12/08/2014 2105   ALT 30 01/02/2015 0947   ALT 23 12/17/2014 1328   ALT 23 12/08/2014 2105   BILITOT 0.80 01/02/2015 0947   BILITOT 0.59 12/17/2014 1328   BILITOT 0.4 12/08/2014 2105         Impression and Plan: James Byrd is 49 year old gentleman with a stage IIIB melanoma of the right hallux that was subungual. He had a microscopic positive inguinal lymph node.  Despite the delay with Yervoy, I still think that we should move ahead and try for his second cycle. I will reduce the dose of treatment a little bit.  I will make sure that he has Decadron on board so that he will not get these reactions or side effects. Again he was just debilitated by the arthralgias.  Hopefully he will take the Lexapro. I told how important it was that he take this help with the chemical balance of the natural brain chemicals.  Hopefully, he will tolerate this cycle better.  I spent about 45 minutes with he and his wife. This has been incredibly frustrating and difficult and I must say with the other patients to have taking Yervoy, they have just not had this type of reaction.  We will try to get him back in 3 weeks for his third cycle of treatment. [name],  MD 7/15/201611:31 AM

## 2015-01-05 ENCOUNTER — Encounter: Payer: Self-pay | Admitting: *Deleted

## 2015-01-06 ENCOUNTER — Telehealth: Payer: Self-pay | Admitting: Hematology & Oncology

## 2015-01-06 NOTE — Telephone Encounter (Signed)
[facility] Ph[facility] [phone] Fx: [phone]  Case: [id number]  Medical Records requested from 09/2013 to present     [place] SCANNED

## 2015-01-08 ENCOUNTER — Telehealth: Payer: Self-pay | Admitting: *Deleted

## 2015-01-08 NOTE — Telephone Encounter (Signed)
Checked on patient to see how he is doing 7 days post his second cycle of chemo. He says he is doing well. No problems with side effects. He states this cycle has been "smooth sailing" and he has been happy with the recovery this cycle. He knows to call the office if he has any problems or questions.

## 2015-01-16 ENCOUNTER — Telehealth: Payer: Self-pay

## 2015-01-16 NOTE — Telephone Encounter (Signed)
Received call from pt's spouse, Hoyle Sauer reporting that pt's "depressive state" has not improved since beginning Lexapro on 01/02/15 and may have slightly worsened. Hoyle Sauer reports that Kari "just sits and stares at the 4 walls." Aside from weakness, pt is not having physical symptoms at this time, including diarrhea. Hoyle Sauer is also certain that patient is correctly taking steroids with this cycle.   Per Dr Marin Olp, pt to increase Lexapro from 10mg  to 20mg . This information given to [facility] who repeats directions and verbalizes understanding. dph

## 2015-01-23 ENCOUNTER — Other Ambulatory Visit (HOSPITAL_BASED_OUTPATIENT_CLINIC_OR_DEPARTMENT_OTHER): Payer: BLUE CROSS/BLUE SHIELD

## 2015-01-23 ENCOUNTER — Ambulatory Visit (HOSPITAL_BASED_OUTPATIENT_CLINIC_OR_DEPARTMENT_OTHER): Payer: BLUE CROSS/BLUE SHIELD

## 2015-01-23 ENCOUNTER — Ambulatory Visit (HOSPITAL_BASED_OUTPATIENT_CLINIC_OR_DEPARTMENT_OTHER): Payer: BLUE CROSS/BLUE SHIELD | Admitting: Hematology & Oncology

## 2015-01-23 ENCOUNTER — Encounter: Payer: Self-pay | Admitting: Hematology & Oncology

## 2015-01-23 VITALS — BP 132/77 | HR 96 | Temp 97.6°F | Resp 16 | Ht 72.0 in | Wt 259.0 lb

## 2015-01-23 DIAGNOSIS — C4371 Malignant melanoma of right lower limb, including hip: Secondary | ICD-10-CM

## 2015-01-23 DIAGNOSIS — F32A Depression, unspecified: Secondary | ICD-10-CM

## 2015-01-23 DIAGNOSIS — R5383 Other fatigue: Secondary | ICD-10-CM

## 2015-01-23 DIAGNOSIS — D500 Iron deficiency anemia secondary to blood loss (chronic): Secondary | ICD-10-CM

## 2015-01-23 DIAGNOSIS — F329 Major depressive disorder, single episode, unspecified: Secondary | ICD-10-CM

## 2015-01-23 DIAGNOSIS — Z5112 Encounter for antineoplastic immunotherapy: Secondary | ICD-10-CM

## 2015-01-23 DIAGNOSIS — Z79899 Other long term (current) drug therapy: Secondary | ICD-10-CM

## 2015-01-23 LAB — CMP (CANCER CENTER ONLY)
ALT(SGPT): 40 U/L (ref 10–47)
AST: 18 U/L (ref 11–38)
Albumin: 3.6 g/dL (ref 3.3–5.5)
Alkaline Phosphatase: 36 U/L (ref 26–84)
BILIRUBIN TOTAL: 1.1 mg/dL (ref 0.20–1.60)
BUN: 26 mg/dL — AB (ref 7–22)
CO2: 26 meq/L (ref 18–33)
CREATININE: 0.6 mg/dL (ref 0.6–1.2)
Calcium: 9.5 mg/dL (ref 8.0–10.3)
Chloride: 100 mEq/L (ref 98–108)
GLUCOSE: 134 mg/dL — AB (ref 73–118)
Potassium: 3.7 mEq/L (ref 3.3–4.7)
Sodium: 135 mEq/L (ref 128–145)
TOTAL PROTEIN: 6.4 g/dL (ref 6.4–8.1)

## 2015-01-23 LAB — LACTATE DEHYDROGENASE: LDH: 308 U/L — ABNORMAL HIGH (ref 94–250)

## 2015-01-23 LAB — CBC WITH DIFFERENTIAL (CANCER CENTER ONLY)
BASO#: 0.1 10*3/uL (ref 0.0–0.2)
BASO%: 0.5 % (ref 0.0–2.0)
EOS ABS: 0 10*3/uL (ref 0.0–0.5)
EOS%: 0 % (ref 0.0–7.0)
HEMATOCRIT: 41.1 % (ref 38.7–49.9)
HGB: 14.6 g/dL (ref 13.0–17.1)
LYMPH#: 0.8 10*3/uL — ABNORMAL LOW (ref 0.9–3.3)
LYMPH%: 4.6 % — AB (ref 14.0–48.0)
MCH: 32.2 pg (ref 28.0–33.4)
MCHC: 35.5 g/dL (ref 32.0–35.9)
MCV: 91 fL (ref 82–98)
MONO#: 0.3 10*3/uL (ref 0.1–0.9)
MONO%: 1.7 % (ref 0.0–13.0)
NEUT#: 15.3 10*3/uL — ABNORMAL HIGH (ref 1.5–6.5)
NEUT%: 93.2 % — ABNORMAL HIGH (ref 40.0–80.0)
Platelets: 141 10*3/uL — ABNORMAL LOW (ref 145–400)
RBC: 4.53 10*6/uL (ref 4.20–5.70)
RDW: 15.1 % (ref 11.1–15.7)
WBC: 16.4 10*3/uL — ABNORMAL HIGH (ref 4.0–10.0)

## 2015-01-23 LAB — CHCC SATELLITE - SMEAR

## 2015-01-23 LAB — TSH CHCC: TSH: 0.277 m(IU)/L — ABNORMAL LOW (ref 0.320–4.118)

## 2015-01-23 MED ORDER — DEXAMETHASONE 4 MG PO TABS: ORAL_TABLET | ORAL | Status: DC

## 2015-01-23 MED ORDER — IPILIMUMAB CHEMO INJECTION 200 MG/40ML
2.4000 mg/kg | Freq: Once | INTRAVENOUS | Status: AC
  Administered 2015-01-23: 300 mg via INTRAVENOUS
  Filled 2015-01-23: qty 20

## 2015-01-23 MED ORDER — HEPARIN SOD (PORK) LOCK FLUSH 100 UNIT/ML IV SOLN
500.0000 [IU] | Freq: Once | INTRAVENOUS | Status: DC | PRN
  Filled 2015-01-23: qty 5

## 2015-01-23 MED ORDER — SODIUM CHLORIDE 0.9 % IV SOLN
Freq: Once | INTRAVENOUS | Status: AC
  Administered 2015-01-23: 11:00:00 via INTRAVENOUS

## 2015-01-23 MED ORDER — SODIUM CHLORIDE 0.9 % IJ SOLN
10.0000 mL | INTRAMUSCULAR | Status: DC | PRN
  Filled 2015-01-23: qty 10

## 2015-01-23 NOTE — Progress Notes (Signed)
Hematology and Oncology Follow Up Visit  James Byrd [id number] [birth year] 49 y.o. 01/23/2015   Principle Diagnosis:   Stage IIIB ([id number]) subungual melanoma of the right hallux  Current Therapy:    Yervoy-status post cycle 2     Interim History:  James Byrd is back for follow-up. He did much better with his second cycle of treatment. He really had very little with side effects. There is some element of depression. We increased his dose of Lexapro.  He's had no arthritis or arthralgias. There's been no ocular issues. He's had no diarrhea. The over did have him on some prophylactic steroids. I think we had him on some Decadron.  His appetite has been good. He's had no nausea or vomiting. He's had no cough. He's had no rashes. He's had no leg swelling.  Overall, his performance status is ECOG 1.  He is out of work. I still want him out of work. I still think he will be able to work for another couple months at least.   Medications:  Current outpatient prescriptions:  .  aspirin 81 MG tablet, Take 81 mg by mouth daily., Disp: , Rfl:  .  dexamethasone (DECADRON) 4 MG tablet, Starting on 7/18, take 5 pills a day for 5 days, then take 4 pills daily for 5 days, then 3 pills daily for 5 days, then 2 pills a day, Disp: 100 tablet, Rfl: 2 .  diltiazem (CARDIZEM) 120 MG tablet, Take 120 mg by mouth daily., Disp: , Rfl:  .  escitalopram (LEXAPRO) 10 MG tablet, Take 1 tablet (10 mg total) by mouth daily., Disp: 90 tablet, Rfl: 90 .  febuxostat (ULORIC) 40 MG tablet, Take 1 tablet (40 mg total) by mouth daily., Disp: 30 tablet, Rfl: 3 .  lidocaine-prilocaine (EMLA) cream, Apply to affected area once, Disp: 30 g, Rfl: 3 .  Multiple Vitamins-Minerals (MENS ONE DAILY PO), Take 1 tablet by mouth daily. , Disp: , Rfl:  .  pravastatin (PRAVACHOL) 40 MG tablet, Take 40 mg by mouth at bedtime. , Disp: , Rfl:  .  predniSONE (DELTASONE) 10 MG tablet, , Disp: , Rfl:  .  sulfacetamide-prednisoLONE  (VASOCIDIN) 10-0.23 % ophthalmic solution, Place 2 drops into both eyes every 2 (two) hours while awake., Disp: 10 mL, Rfl: 0 No current facility-administered medications for this visit.  Facility-Administered Medications Ordered in Other Visits:  .  heparin lock flush 100 unit/mL, 500 Units, Intracatheter, Once PRN, [name], MD .  sodium chloride 0.9 % injection 10 mL, 10 mL, Intracatheter, PRN, [name], MD  Allergies: No Known Allergies  Past Medical History, Surgical history, Social history, and Family History were reviewed and updated.  Review of Systems: As above  Physical Exam:  height is 6' (1.829 m) and weight is 259 lb (117.482 kg). His oral temperature is 97.6 F (36.4 C). His blood pressure is 132/77 and his pulse is 96. His respiration is 16.   Wt Readings from Last 3 Encounters:  01/23/15 259 lb (117.482 kg)  01/02/15 273 lb (123.832 kg)  12/17/14 265 lb (120.203 kg)     Well developed and well nourished white James Byrd. Head and neck exam shows no ocular or oral lesions. There might be some slight injectable inflammation, more so on the left eye. He has no adenopathy in the neck. Lungs are clear. He has no rales, wheezes or rhonchi. Cardiac exam regular rate and rhythm with no murmurs, rubs or bruits. Abdomen is soft. He has good  bowel sounds. There is no fluid wave. There is no palpable liver or spleen tip. Back exam shows no tenderness over the spine, ribs or hips. Extremities shows the amputated big toe of the right foot. There is no swelling in the right leg. Skin exam shows no rashes or ecchymosis or petechia. Neurological exam is nonfocal.  Lab Results  Component Value Date   WBC 16.4* 01/23/2015   HGB 14.6 01/23/2015   HCT 41.1 01/23/2015   MCV 91 01/23/2015   PLT 141* 01/23/2015     Chemistry      Component Value Date/Time   NA 135 01/23/2015 0931   NA 139 12/17/2014 1328   NA 141 12/08/2014 2105   K 3.7 01/23/2015 0931   K 4.3 12/17/2014  1328   K 4.0 12/08/2014 2105   CL 100 01/23/2015 0931   CL 108 12/08/2014 2105   CO2 26 01/23/2015 0931   CO2 24 12/17/2014 1328   CO2 23 12/08/2014 2105   BUN 26* 01/23/2015 0931   BUN 14.9 12/17/2014 1328   BUN 22* 12/08/2014 2105   CREATININE 0.6 01/23/2015 0931   CREATININE 0.8 12/17/2014 1328   CREATININE 0.84 12/08/2014 2105      Component Value Date/Time   CALCIUM 9.5 01/23/2015 0931   CALCIUM 9.3 12/17/2014 1328   CALCIUM 8.4* 12/08/2014 2105   ALKPHOS 36 01/23/2015 0931   ALKPHOS 56 12/17/2014 1328   ALKPHOS 45 12/08/2014 2105   AST 18 01/23/2015 0931   AST 13 12/17/2014 1328   AST 17 12/08/2014 2105   ALT 40 01/23/2015 0931   ALT 23 12/17/2014 1328   ALT 23 12/08/2014 2105   BILITOT 1.10 01/23/2015 0931   BILITOT 0.59 12/17/2014 1328   BILITOT 0.4 12/08/2014 2105         Impression and Plan: James Byrd is 49 year old gentleman with a stage IIIB melanoma of the right hallux that was subungual. He had a microscopic positive inguinal lymph node.  I'm glad that he tolerated his second cycle well. He is in a much better frame of mind.  I told them we just have one more after this.  We will continue him on the steroids. I will go ahead and refill this for him.  We will plan to see him back for his fourth and final cycle of treatment.  Hopefully, he will not recur. The inguinal lymph node that was positive was just microscopically positive.   [name], MD 8/5/20163:45 PM

## 2015-01-23 NOTE — Patient Instructions (Signed)
[facility] Discharge Instructions for Patients Receiving Chemotherapy  Today you received the following chemotherapy agents Yervoy To help prevent nausea and vomiting after your treatment, we encourage you to take your nausea medication as prescribed.   If you develop nausea and vomiting that is not controlled by your nausea medication, call the clinic.   BELOW ARE SYMPTOMS THAT SHOULD BE REPORTED IMMEDIATELY:  *FEVER GREATER THAN 100.5 F  *CHILLS WITH OR WITHOUT FEVER  NAUSEA AND VOMITING THAT IS NOT CONTROLLED WITH YOUR NAUSEA MEDICATION  *UNUSUAL SHORTNESS OF BREATH  *UNUSUAL BRUISING OR BLEEDING  TENDERNESS IN MOUTH AND THROAT WITH OR WITHOUT PRESENCE OF ULCERS  *URINARY PROBLEMS  *BOWEL PROBLEMS  UNUSUAL RASH Items with * indicate a potential emergency and should be followed up as soon as possible.  Feel free to call the clinic you have any questions or concerns. The clinic phone number is [phone].  Please show the [facility] at check-in to the Emergency Department and triage nurse.

## 2015-01-27 ENCOUNTER — Telehealth: Payer: Self-pay | Admitting: Hematology & Oncology

## 2015-01-27 NOTE — Telephone Encounter (Signed)
Pt brought disabiliy papers to be comepledte from [place]. They both were faxed on 01/23/2015.  [place] sent a fax back saying, "Incomplete or missing information on the form."   However, [employer] sent a fax form today saying. "send all office notes." Not sure if they are part of the same division, but I am sending medical notes to:  [facility]: [id number] F: [phone] P: [phone]  [facility] F: [phone]  [place]: [id number] P: [id number] F: [phone]  Pt did take the orig copies with him.    COPY SCANNED

## 2015-02-03 ENCOUNTER — Other Ambulatory Visit: Payer: Self-pay | Admitting: Nurse Practitioner

## 2015-02-03 DIAGNOSIS — C4371 Malignant melanoma of right lower limb, including hip: Secondary | ICD-10-CM

## 2015-02-05 ENCOUNTER — Other Ambulatory Visit (HOSPITAL_BASED_OUTPATIENT_CLINIC_OR_DEPARTMENT_OTHER): Payer: BLUE CROSS/BLUE SHIELD

## 2015-02-05 ENCOUNTER — Telehealth: Payer: Self-pay | Admitting: Hematology & Oncology

## 2015-02-05 ENCOUNTER — Ambulatory Visit (HOSPITAL_BASED_OUTPATIENT_CLINIC_OR_DEPARTMENT_OTHER): Payer: BLUE CROSS/BLUE SHIELD | Admitting: Family

## 2015-02-05 ENCOUNTER — Encounter: Payer: Self-pay | Admitting: Family

## 2015-02-05 ENCOUNTER — Ambulatory Visit (HOSPITAL_BASED_OUTPATIENT_CLINIC_OR_DEPARTMENT_OTHER): Payer: BLUE CROSS/BLUE SHIELD

## 2015-02-05 VITALS — BP 125/79 | HR 83 | Temp 97.9°F | Resp 16 | Ht 72.0 in

## 2015-02-05 DIAGNOSIS — C4371 Malignant melanoma of right lower limb, including hip: Secondary | ICD-10-CM

## 2015-02-05 DIAGNOSIS — E860 Dehydration: Secondary | ICD-10-CM

## 2015-02-05 LAB — CBC WITH DIFFERENTIAL (CANCER CENTER ONLY)
BASO#: 0 10*3/uL (ref 0.0–0.2)
BASO%: 0.1 % (ref 0.0–2.0)
EOS%: 0.3 % (ref 0.0–7.0)
Eosinophils Absolute: 0 10*3/uL (ref 0.0–0.5)
HCT: 37.6 % — ABNORMAL LOW (ref 38.7–49.9)
HGB: 13.2 g/dL (ref 13.0–17.1)
LYMPH#: 0.8 10*3/uL — ABNORMAL LOW (ref 0.9–3.3)
LYMPH%: 9.1 % — ABNORMAL LOW (ref 14.0–48.0)
MCH: 32.4 pg (ref 28.0–33.4)
MCHC: 35.1 g/dL (ref 32.0–35.9)
MCV: 92 fL (ref 82–98)
MONO#: 0.5 10*3/uL (ref 0.1–0.9)
MONO%: 5.5 % (ref 0.0–13.0)
NEUT#: 7.3 10*3/uL — ABNORMAL HIGH (ref 1.5–6.5)
NEUT%: 85 % — AB (ref 40.0–80.0)
Platelets: 94 10*3/uL — ABNORMAL LOW (ref 145–400)
RBC: 4.08 10*6/uL — ABNORMAL LOW (ref 4.20–5.70)
RDW: 15.9 % — AB (ref 11.1–15.7)
WBC: 8.6 10*3/uL (ref 4.0–10.0)

## 2015-02-05 LAB — CMP (CANCER CENTER ONLY)
ALT: 45 U/L (ref 10–47)
AST: 21 U/L (ref 11–38)
Albumin: 3.3 g/dL (ref 3.3–5.5)
Alkaline Phosphatase: 37 U/L (ref 26–84)
BILIRUBIN TOTAL: 0.8 mg/dL (ref 0.20–1.60)
BUN: 20 mg/dL (ref 7–22)
CHLORIDE: 101 meq/L (ref 98–108)
CO2: 25 mEq/L (ref 18–33)
CREATININE: 0.8 mg/dL (ref 0.6–1.2)
Calcium: 8.8 mg/dL (ref 8.0–10.3)
Glucose, Bld: 83 mg/dL (ref 73–118)
Potassium: 3.7 mEq/L (ref 3.3–4.7)
SODIUM: 136 meq/L (ref 128–145)
TOTAL PROTEIN: 5.9 g/dL — AB (ref 6.4–8.1)

## 2015-02-05 LAB — TECHNOLOGIST REVIEW CHCC SATELLITE

## 2015-02-05 LAB — LACTATE DEHYDROGENASE: LDH: 351 U/L — AB (ref 94–250)

## 2015-02-05 MED ORDER — SODIUM CHLORIDE 0.9 % IV SOLN
INTRAVENOUS | Status: AC
  Administered 2015-02-05: 15:00:00 via INTRAVENOUS

## 2015-02-05 NOTE — Telephone Encounter (Signed)
na

## 2015-02-05 NOTE — Progress Notes (Signed)
Hematology and Oncology Follow Up Visit  Yonas Hovde [id number] 1966-02-25 49 y.o. 02/05/2015   Principle Diagnosis:  Stage IIIB (T3bN1aM0) subungual melanoma of the right hallux  Current Therapy:   YERVOY q 21 days s/p cycle 3    Interim History:  Mr. Ory is here today with his wife. He is doing fairly well. He has not completed 3 of 4 cycles of treatment. He is feeling weak and has not been eating well.  He will start drinking 3 boost a day and eating 5-6 small meals a day. His taste is dull with the chemo and he has no appetite. He does feel that he is not drinking enough fluids and needs IV fluids today.  His legs are weak and he has trouble standing and keeping his balance. He has to hold on to things to maneuver around the room. I think he will definitely benefit from PT once he has finished treatment.  He denies having any pain at this time. He has no swelling or tenderness in his extremities. He does have the "moon face" look from the Decadron. He does have some mild numbness and tingling in his hands and feet.  No fever, chills, n/v, cough, rash, dizziness, SOB, chest pain, palpitations, abdominal pain, constipation, diarrhea, blood in urine. He has hemorrhoids and has a small amount blood in his stool if he strains at all. No vision changes. He is currently on Decadron 2 tables daily.    Medications:    Medication List       This list is accurate as of: 02/05/15  1:53 PM.  Always use your most recent med list.               aspirin 81 MG tablet  Take 81 mg by mouth daily.     dexamethasone 4 MG tablet  Commonly known as:  DECADRON  Starting on 8/6, take 4 pills a day for 3 days, then take 3 pills daily for 3 days, then 2 pills daily for 3 days, then 1 pill a day     diltiazem 120 MG tablet  Commonly known as:  CARDIZEM  Take 120 mg by mouth daily.     escitalopram 10 MG tablet  Commonly known as:  LEXAPRO  Take 1 tablet (10 mg total) by mouth daily.     febuxostat 40 MG tablet  Commonly known as:  ULORIC  Take 1 tablet (40 mg total) by mouth daily.     lidocaine-prilocaine cream  Commonly known as:  EMLA  Apply to affected area once     MENS ONE DAILY PO  Take 1 tablet by mouth daily.     pravastatin 40 MG tablet  Commonly known as:  PRAVACHOL  Take 40 mg by mouth at bedtime.     predniSONE 10 MG tablet  Commonly known as:  DELTASONE     sulfacetamide-prednisoLONE 10-0.23 % ophthalmic solution  Commonly known as:  VASOCIDIN  Place 2 drops into both eyes every 2 (two) hours while awake.        Allergies: No Known Allergies  Past Medical History, Surgical history, Social history, and Family History were reviewed and updated.  Review of Systems: All other 10 point review of systems is negative.   Physical Exam:  vitals were not taken for this visit.  Wt Readings from Last 3 Encounters:  01/23/15 259 lb (117.482 kg)  01/02/15 273 lb (123.832 kg)  12/17/14 265 lb (120.203 kg)    Ocular: Sclerae  unicteric, pupils equal, round and reactive to light Ear-nose-throat: Oropharynx clear, dentition fair Lymphatic: No cervical or supraclavicular adenopathy Lungs no rales or rhonchi, good excursion bilaterally Heart regular rate and rhythm, no murmur appreciated Abd soft, nontender, positive bowel sounds MSK no focal spinal tenderness, no joint edema Neuro: non-focal, well-oriented, appropriate affect Breasts: Deferred  Lab Results  Component Value Date   WBC 16.4* 01/23/2015   HGB 14.6 01/23/2015   HCT 41.1 01/23/2015   MCV 91 01/23/2015   PLT 141* 01/23/2015   No results found for: FERRITIN, IRON, TIBC, UIBC, IRONPCTSAT Lab Results  Component Value Date   RBC 4.53 01/23/2015   No results found for: KPAFRELGTCHN, LAMBDASER, KAPLAMBRATIO No results found for: IGGSERUM, IGA, IGMSERUM No results found for: Kathrynn Ducking, MSPIKE, SPEI   Chemistry      Component Value  Date/Time   NA 135 01/23/2015 0931   NA 139 12/17/2014 1328   NA 141 12/08/2014 2105   K 3.7 01/23/2015 0931   K 4.3 12/17/2014 1328   K 4.0 12/08/2014 2105   CL 100 01/23/2015 0931   CL 108 12/08/2014 2105   CO2 26 01/23/2015 0931   CO2 24 12/17/2014 1328   CO2 23 12/08/2014 2105   BUN 26* 01/23/2015 0931   BUN 14.9 12/17/2014 1328   BUN 22* 12/08/2014 2105   CREATININE 0.6 01/23/2015 0931   CREATININE 0.8 12/17/2014 1328   CREATININE 0.84 12/08/2014 2105      Component Value Date/Time   CALCIUM 9.5 01/23/2015 0931   CALCIUM 9.3 12/17/2014 1328   CALCIUM 8.4* 12/08/2014 2105   ALKPHOS 36 01/23/2015 0931   ALKPHOS 56 12/17/2014 1328   ALKPHOS 45 12/08/2014 2105   AST 18 01/23/2015 0931   AST 13 12/17/2014 1328   AST 17 12/08/2014 2105   ALT 40 01/23/2015 0931   ALT 23 12/17/2014 1328   ALT 23 12/08/2014 2105   BILITOT 1.10 01/23/2015 0931   BILITOT 0.59 12/17/2014 1328   BILITOT 0.4 12/08/2014 2105     Impression and Plan: Mr. Lastrapes is a really nice 49 year old gentleman. He has a stage IIIB subungual melanoma of the right great toe. He had this portion of his toe removed and had a sentinel lymph node with microscopic involvement. He has now completed 3 of 4 cycles of Yervoy. He is feeling weak and dehydrated today.  We will give him fluids today. He can also cut his Decadron to 1 table a day for 2 days and then stop.  We will plan to see him back in 2 weeks for follow-up, labs and for his fourth and final treatment. Both he and his wife know to contact us with any questions or concerns. We can certainly see him sooner if need be.   Eliezer Bottom, NP 8/18/20161:53 PM

## 2015-02-05 NOTE — Patient Instructions (Signed)
Dehydration, Adult Dehydration is when you lose more fluids from the body than you take in. Vital organs like the kidneys, brain, and heart cannot function without a proper amount of fluids and salt. Any loss of fluids from the body can cause dehydration.  CAUSES   Vomiting.  Diarrhea.  Excessive sweating.  Excessive urine output.  Fever. SYMPTOMS  Mild dehydration  Thirst.  Dry lips.  Slightly dry mouth. Moderate dehydration  Very dry mouth.  Sunken eyes.  Skin does not bounce back quickly when lightly pinched and released.  Dark urine and decreased urine production.  Decreased tear production.  Headache. Severe dehydration  Very dry mouth.  Extreme thirst.  Rapid, weak pulse (more than 100 beats per minute at rest).  Cold hands and feet.  Not able to sweat in spite of heat and temperature.  Rapid breathing.  Blue lips.  Confusion and lethargy.  Difficulty being awakened.  Minimal urine production.  No tears. DIAGNOSIS  Your caregiver will diagnose dehydration based on your symptoms and your exam. Blood and urine tests will help confirm the diagnosis. The diagnostic evaluation should also identify the cause of dehydration. TREATMENT  Treatment of mild or moderate dehydration can often be done at home by increasing the amount of fluids that you drink. It is best to drink small amounts of fluid more often. Drinking too much at one time can make vomiting worse. Refer to the home care instructions below. Severe dehydration needs to be treated at the hospital where you will probably be given intravenous (IV) fluids that contain water and electrolytes. HOME CARE INSTRUCTIONS   Ask your caregiver about specific rehydration instructions.  Drink enough fluids to keep your urine clear or pale yellow.  Drink small amounts frequently if you have nausea and vomiting.  Eat as you normally do.  Avoid:  Foods or drinks high in sugar.  Carbonated  drinks.  Juice.  Extremely hot or cold fluids.  Drinks with caffeine.  Fatty, greasy foods.  Alcohol.  Tobacco.  Overeating.  Gelatin desserts.  Wash your hands well to avoid spreading bacteria and viruses.  Only take over-the-counter or prescription medicines for pain, discomfort, or fever as directed by your caregiver.  Ask your caregiver if you should continue all prescribed and over-the-counter medicines.  Keep all follow-up appointments with your caregiver. SEEK MEDICAL CARE IF:  You have abdominal pain and it increases or stays in one area (localizes).  You have a rash, stiff neck, or severe headache.  You are irritable, sleepy, or difficult to awaken.  You are weak, dizzy, or extremely thirsty. SEEK IMMEDIATE MEDICAL CARE IF:   You are unable to keep fluids down or you get worse despite treatment.  You have frequent episodes of vomiting or diarrhea.  You have blood or green matter (bile) in your vomit.  You have blood in your stool or your stool looks black and tarry.  You have not urinated in 6 to 8 hours, or you have only urinated a small amount of very dark urine.  You have a fever.  You faint. MAKE SURE YOU:   Understand these instructions.  Will watch your condition.  Will get help right away if you are not doing well or get worse. Document Released: 06/06/2005 Document Revised: 08/29/2011 Document Reviewed: 01/24/2011 ExitCare Patient Information 2015 [employer]. This information is not intended to replace advice given to you by your health care provider. Make sure you discuss any questions you have with your health care   provider.  

## 2015-02-06 ENCOUNTER — Other Ambulatory Visit: Payer: BLUE CROSS/BLUE SHIELD

## 2015-02-06 ENCOUNTER — Ambulatory Visit: Payer: BLUE CROSS/BLUE SHIELD

## 2015-02-06 ENCOUNTER — Ambulatory Visit: Payer: BLUE CROSS/BLUE SHIELD | Admitting: Family

## 2015-02-09 ENCOUNTER — Emergency Department (HOSPITAL_BASED_OUTPATIENT_CLINIC_OR_DEPARTMENT_OTHER): Payer: BLUE CROSS/BLUE SHIELD

## 2015-02-09 ENCOUNTER — Encounter (HOSPITAL_BASED_OUTPATIENT_CLINIC_OR_DEPARTMENT_OTHER): Payer: Self-pay

## 2015-02-09 ENCOUNTER — Inpatient Hospital Stay (HOSPITAL_BASED_OUTPATIENT_CLINIC_OR_DEPARTMENT_OTHER)
Admission: EM | Admit: 2015-02-09 | Discharge: 2015-02-16 | DRG: 193 | Disposition: A | Discharge status: Home / Self Care | Payer: BLUE CROSS/BLUE SHIELD | Attending: Internal Medicine | Admitting: Internal Medicine

## 2015-02-09 DIAGNOSIS — R1310 Dysphagia, unspecified: Secondary | ICD-10-CM | POA: Diagnosis present

## 2015-02-09 DIAGNOSIS — E785 Hyperlipidemia, unspecified: Secondary | ICD-10-CM | POA: Diagnosis present

## 2015-02-09 DIAGNOSIS — T451X5A Adverse effect of antineoplastic and immunosuppressive drugs, initial encounter: Secondary | ICD-10-CM | POA: Diagnosis present

## 2015-02-09 DIAGNOSIS — T50905A Adverse effect of unspecified drugs, medicaments and biological substances, initial encounter: Secondary | ICD-10-CM

## 2015-02-09 DIAGNOSIS — E871 Hypo-osmolality and hyponatremia: Secondary | ICD-10-CM | POA: Diagnosis present

## 2015-02-09 DIAGNOSIS — N179 Acute kidney failure, unspecified: Secondary | ICD-10-CM | POA: Diagnosis present

## 2015-02-09 DIAGNOSIS — F419 Anxiety disorder, unspecified: Secondary | ICD-10-CM | POA: Diagnosis present

## 2015-02-09 DIAGNOSIS — D630 Anemia in neoplastic disease: Secondary | ICD-10-CM | POA: Diagnosis present

## 2015-02-09 DIAGNOSIS — Z79899 Other long term (current) drug therapy: Secondary | ICD-10-CM | POA: Diagnosis not present

## 2015-02-09 DIAGNOSIS — D696 Thrombocytopenia, unspecified: Secondary | ICD-10-CM | POA: Diagnosis present

## 2015-02-09 DIAGNOSIS — Z89411 Acquired absence of right great toe: Secondary | ICD-10-CM | POA: Diagnosis not present

## 2015-02-09 DIAGNOSIS — G4733 Obstructive sleep apnea (adult) (pediatric): Secondary | ICD-10-CM | POA: Diagnosis present

## 2015-02-09 DIAGNOSIS — I5032 Chronic diastolic (congestive) heart failure: Secondary | ICD-10-CM | POA: Diagnosis present

## 2015-02-09 DIAGNOSIS — J984 Other disorders of lung: Secondary | ICD-10-CM | POA: Diagnosis present

## 2015-02-09 DIAGNOSIS — R918 Other nonspecific abnormal finding of lung field: Secondary | ICD-10-CM | POA: Diagnosis not present

## 2015-02-09 DIAGNOSIS — J9601 Acute respiratory failure with hypoxia: Secondary | ICD-10-CM | POA: Insufficient documentation

## 2015-02-09 DIAGNOSIS — Z9221 Personal history of antineoplastic chemotherapy: Secondary | ICD-10-CM | POA: Diagnosis not present

## 2015-02-09 DIAGNOSIS — C439 Malignant melanoma of skin, unspecified: Secondary | ICD-10-CM | POA: Diagnosis present

## 2015-02-09 DIAGNOSIS — C4371 Malignant melanoma of right lower limb, including hip: Secondary | ICD-10-CM | POA: Diagnosis not present

## 2015-02-09 DIAGNOSIS — J189 Pneumonia, unspecified organism: Principal | ICD-10-CM | POA: Diagnosis present

## 2015-02-09 DIAGNOSIS — E876 Hypokalemia: Secondary | ICD-10-CM | POA: Diagnosis present

## 2015-02-09 DIAGNOSIS — Z7982 Long term (current) use of aspirin: Secondary | ICD-10-CM

## 2015-02-09 DIAGNOSIS — J9600 Acute respiratory failure, unspecified whether with hypoxia or hypercapnia: Secondary | ICD-10-CM | POA: Diagnosis not present

## 2015-02-09 DIAGNOSIS — Z66 Do not resuscitate: Secondary | ICD-10-CM | POA: Diagnosis present

## 2015-02-09 DIAGNOSIS — I471 Supraventricular tachycardia, unspecified: Secondary | ICD-10-CM | POA: Diagnosis present

## 2015-02-09 DIAGNOSIS — D6481 Anemia due to antineoplastic chemotherapy: Secondary | ICD-10-CM | POA: Diagnosis not present

## 2015-02-09 DIAGNOSIS — Y95 Nosocomial condition: Secondary | ICD-10-CM | POA: Diagnosis present

## 2015-02-09 HISTORY — DX: Hyperlipidemia, unspecified: E78.5

## 2015-02-09 HISTORY — DX: Chronic diastolic (congestive) heart failure: I50.32

## 2015-02-09 LAB — CBC WITH DIFFERENTIAL/PLATELET
BAND NEUTROPHILS: 21 % — AB (ref 0–10)
BASOS ABS: 0 10*3/uL (ref 0.0–0.1)
BASOS PCT: 0 % (ref 0–1)
Blasts: 0 %
EOS ABS: 0 10*3/uL (ref 0.0–0.7)
Eosinophils Relative: 0 % (ref 0–5)
HCT: 35.2 % — ABNORMAL LOW (ref 39.0–52.0)
Hemoglobin: 12 g/dL — ABNORMAL LOW (ref 13.0–17.0)
LYMPHS ABS: 0.8 10*3/uL (ref 0.7–4.0)
LYMPHS PCT: 15 % (ref 12–46)
MCH: 31.6 pg (ref 26.0–34.0)
MCHC: 34.1 g/dL (ref 30.0–36.0)
MCV: 92.6 fL (ref 78.0–100.0)
METAMYELOCYTES PCT: 0 %
MONOS PCT: 4 % (ref 3–12)
Monocytes Absolute: 0.2 10*3/uL (ref 0.1–1.0)
Myelocytes: 0 %
NEUTROS ABS: 4.3 10*3/uL (ref 1.7–7.7)
Neutrophils Relative %: 60 % (ref 43–77)
PLATELETS: 90 10*3/uL — AB (ref 150–400)
PROMYELOCYTES ABS: 0 %
RBC: 3.8 MIL/uL — ABNORMAL LOW (ref 4.22–5.81)
RDW: 16.7 % — AB (ref 11.5–15.5)
WBC Morphology: INCREASED
WBC: 5.3 10*3/uL (ref 4.0–10.5)
nRBC: 0 /100 WBC

## 2015-02-09 LAB — STREP PNEUMONIAE URINARY ANTIGEN: Strep Pneumo Urinary Antigen: NEGATIVE

## 2015-02-09 LAB — I-STAT ARTERIAL BLOOD GAS, ED
ACID-BASE EXCESS: 4 mmol/L — AB (ref 0.0–2.0)
Bicarbonate: 27 mEq/L — ABNORMAL HIGH (ref 20.0–24.0)
O2 SAT: 97 %
PCO2 ART: 36.2 mmHg (ref 35.0–45.0)
TCO2: 28 mmol/L (ref 0–100)
pH, Arterial: 7.482 — ABNORMAL HIGH (ref 7.350–7.450)
pO2, Arterial: 83 mmHg (ref 80.0–100.0)

## 2015-02-09 LAB — URINALYSIS, ROUTINE W REFLEX MICROSCOPIC
BILIRUBIN URINE: NEGATIVE
Glucose, UA: NEGATIVE mg/dL
HGB URINE DIPSTICK: NEGATIVE
KETONES UR: NEGATIVE mg/dL
Leukocytes, UA: NEGATIVE
Nitrite: NEGATIVE
PROTEIN: NEGATIVE mg/dL
Specific Gravity, Urine: 1.015 (ref 1.005–1.030)
UROBILINOGEN UA: 1 mg/dL (ref 0.0–1.0)
pH: 6 (ref 5.0–8.0)

## 2015-02-09 LAB — PROCALCITONIN: PROCALCITONIN: 0.14 ng/mL

## 2015-02-09 LAB — COMPREHENSIVE METABOLIC PANEL
ALBUMIN: 3.2 g/dL — AB (ref 3.5–5.0)
ALT: 41 U/L (ref 17–63)
AST: 16 U/L (ref 15–41)
Alkaline Phosphatase: 38 U/L (ref 38–126)
Anion gap: 12 (ref 5–15)
BILIRUBIN TOTAL: 1.2 mg/dL (ref 0.3–1.2)
BUN: 10 mg/dL (ref 6–20)
CO2: 25 mmol/L (ref 22–32)
CREATININE: 0.6 mg/dL — AB (ref 0.61–1.24)
Calcium: 8.5 mg/dL — ABNORMAL LOW (ref 8.9–10.3)
Chloride: 95 mmol/L — ABNORMAL LOW (ref 101–111)
GFR calc Af Amer: >60 mL/min (ref >60)
GLUCOSE: 89 mg/dL (ref 65–99)
POTASSIUM: 3.2 mmol/L — AB (ref 3.5–5.1)
Sodium: 132 mmol/L — ABNORMAL LOW (ref 135–145)
TOTAL PROTEIN: 6.1 g/dL — AB (ref 6.5–8.1)

## 2015-02-09 LAB — LACTIC ACID, PLASMA: Lactic Acid, Venous: 0.7 mmol/L (ref 0.5–2.0)

## 2015-02-09 LAB — APTT: aPTT: 33 seconds (ref 24–37)

## 2015-02-09 LAB — CBG MONITORING, ED: Glucose-Capillary: 83 mg/dL (ref 65–99)

## 2015-02-09 LAB — RAPID URINE DRUG SCREEN, HOSP PERFORMED
AMPHETAMINES: NOT DETECTED
BENZODIAZEPINES: NOT DETECTED
Barbiturates: NOT DETECTED
Cocaine: NOT DETECTED
OPIATES: NOT DETECTED
TETRAHYDROCANNABINOL: NOT DETECTED

## 2015-02-09 LAB — I-STAT CG4 LACTIC ACID, ED: LACTIC ACID, VENOUS: 0.93 mmol/L (ref 0.5–2.0)

## 2015-02-09 LAB — BRAIN NATRIURETIC PEPTIDE: B Natriuretic Peptide: 17 pg/mL (ref 0.0–100.0)

## 2015-02-09 LAB — PROTIME-INR
INR: 1.07 (ref 0.00–1.49)
PROTHROMBIN TIME: 14.1 s (ref 11.6–15.2)

## 2015-02-09 MED ORDER — ONDANSETRON HCL 4 MG PO TABS
4.0000 mg | ORAL_TABLET | Freq: Four times a day (QID) | ORAL | Status: DC | PRN
  Administered 2015-02-09: 4 mg via ORAL
  Filled 2015-02-09: qty 1

## 2015-02-09 MED ORDER — POTASSIUM CHLORIDE CRYS ER 20 MEQ PO TBCR: 40.0000 meq | EXTENDED_RELEASE_TABLET | ORAL | Status: DC

## 2015-02-09 MED ORDER — ONDANSETRON HCL 4 MG/2ML IJ SOLN: 4.0000 mg | Freq: Four times a day (QID) | INTRAMUSCULAR | Status: DC | PRN

## 2015-02-09 MED ORDER — SODIUM CHLORIDE 0.9 % IV BOLUS (SEPSIS)
1000.0000 mL | Freq: Once | INTRAVENOUS | Status: AC
  Administered 2015-02-09: 1000 mL via INTRAVENOUS

## 2015-02-09 MED ORDER — CEFTAZIDIME 1 G IJ SOLR
INTRAMUSCULAR | Status: AC
  Administered 2015-02-09: 2000 mg
  Filled 2015-02-09: qty 1

## 2015-02-09 MED ORDER — DILTIAZEM HCL 60 MG PO TABS
120.0000 mg | ORAL_TABLET | Freq: Every day | ORAL | Status: DC
  Administered 2015-02-10 – 2015-02-16 (×7): 120 mg via ORAL
  Filled 2015-02-09 (×7): qty 2

## 2015-02-09 MED ORDER — ACETAMINOPHEN 325 MG PO TABS
650.0000 mg | ORAL_TABLET | Freq: Four times a day (QID) | ORAL | Status: DC | PRN
  Administered 2015-02-10 – 2015-02-11 (×2): 650 mg via ORAL
  Filled 2015-02-09 (×2): qty 2

## 2015-02-09 MED ORDER — IOHEXOL 350 MG/ML SOLN
100.0000 mL | Freq: Once | INTRAVENOUS | Status: AC | PRN
  Administered 2015-02-09: 100 mL via INTRAVENOUS

## 2015-02-09 MED ORDER — ACETAMINOPHEN 650 MG RE SUPP: 650.0000 mg | Freq: Four times a day (QID) | RECTAL | Status: DC | PRN

## 2015-02-09 MED ORDER — DEXTROSE 5 % IV SOLN
2.0000 g | Freq: Three times a day (TID) | INTRAVENOUS | Status: DC
  Filled 2015-02-09 (×2): qty 2

## 2015-02-09 MED ORDER — VANCOMYCIN HCL IN DEXTROSE 1-5 GM/200ML-% IV SOLN
1000.0000 mg | Freq: Once | INTRAVENOUS | Status: AC
  Administered 2015-02-09: 1000 mg via INTRAVENOUS
  Filled 2015-02-09: qty 200

## 2015-02-09 MED ORDER — SODIUM CHLORIDE 0.9 % IV SOLN
INTRAVENOUS | Status: DC
  Administered 2015-02-09: 21:00:00 via INTRAVENOUS

## 2015-02-09 MED ORDER — DM-GUAIFENESIN ER 30-600 MG PO TB12
1.0000 | ORAL_TABLET | Freq: Two times a day (BID) | ORAL | Status: DC
  Administered 2015-02-09 – 2015-02-10 (×2): 1 via ORAL
  Filled 2015-02-09 (×3): qty 1

## 2015-02-09 MED ORDER — PRAVASTATIN SODIUM 40 MG PO TABS
40.0000 mg | ORAL_TABLET | Freq: Every day | ORAL | Status: DC
  Administered 2015-02-09 – 2015-02-15 (×7): 40 mg via ORAL
  Filled 2015-02-09 (×7): qty 1

## 2015-02-09 MED ORDER — VANCOMYCIN HCL IN DEXTROSE 1-5 GM/200ML-% IV SOLN: 1000.0000 mg | Freq: Once | INTRAVENOUS | Status: DC

## 2015-02-09 MED ORDER — DEXTROSE 5 % IV SOLN: 1.0000 g | Freq: Once | INTRAVENOUS | Status: DC

## 2015-02-09 MED ORDER — POTASSIUM CHLORIDE CRYS ER 20 MEQ PO TBCR
20.0000 meq | EXTENDED_RELEASE_TABLET | Freq: Once | ORAL | Status: AC
  Administered 2015-02-09: 20 meq via ORAL
  Filled 2015-02-09: qty 1

## 2015-02-09 MED ORDER — ENOXAPARIN SODIUM 40 MG/0.4ML ~~LOC~~ SOLN: 40.0000 mg | SUBCUTANEOUS | Status: DC

## 2015-02-09 MED ORDER — DEXTROSE 5 % IV SOLN: 2.0000 g | Freq: Three times a day (TID) | INTRAVENOUS | Status: DC

## 2015-02-09 MED ORDER — SODIUM CHLORIDE 0.9 % IV SOLN
INTRAVENOUS | Status: DC
  Administered 2015-02-09: 1000 mL via INTRAVENOUS

## 2015-02-09 MED ORDER — POTASSIUM CHLORIDE IN NACL 40-0.9 MEQ/L-% IV SOLN
INTRAVENOUS | Status: DC
  Filled 2015-02-09: qty 1000

## 2015-02-09 MED ORDER — ESCITALOPRAM OXALATE 10 MG PO TABS
10.0000 mg | ORAL_TABLET | Freq: Every day | ORAL | Status: DC
  Administered 2015-02-10 – 2015-02-16 (×7): 10 mg via ORAL
  Filled 2015-02-09 (×7): qty 1

## 2015-02-09 MED ORDER — ALUM & MAG HYDROXIDE-SIMETH 200-200-20 MG/5ML PO SUSP: 30.0000 mL | Freq: Four times a day (QID) | ORAL | Status: DC | PRN

## 2015-02-09 MED ORDER — DEXTROSE 5 % IV SOLN: 500.0000 mg | INTRAVENOUS | Status: DC

## 2015-02-09 MED ORDER — LIDOCAINE-PRILOCAINE 2.5-2.5 % EX CREA: TOPICAL_CREAM | CUTANEOUS | Status: DC | PRN

## 2015-02-09 MED ORDER — PIPERACILLIN-TAZOBACTAM 3.375 G IVPB
3.3750 g | Freq: Three times a day (TID) | INTRAVENOUS | Status: DC
  Administered 2015-02-09 – 2015-02-13 (×11): 3.375 g via INTRAVENOUS
  Filled 2015-02-09 (×13): qty 50

## 2015-02-09 MED ORDER — VANCOMYCIN HCL IN DEXTROSE 1-5 GM/200ML-% IV SOLN
1000.0000 mg | Freq: Three times a day (TID) | INTRAVENOUS | Status: DC
  Filled 2015-02-09 (×2): qty 200

## 2015-02-09 MED ORDER — VANCOMYCIN HCL IN DEXTROSE 1-5 GM/200ML-% IV SOLN
1000.0000 mg | Freq: Three times a day (TID) | INTRAVENOUS | Status: DC
  Administered 2015-02-09 – 2015-02-13 (×12): 1000 mg via INTRAVENOUS
  Filled 2015-02-09 (×14): qty 200

## 2015-02-09 MED ORDER — SODIUM CHLORIDE 0.9 % IJ SOLN
3.0000 mL | Freq: Two times a day (BID) | INTRAMUSCULAR | Status: DC
  Administered 2015-02-10: 10 mL via INTRAVENOUS
  Administered 2015-02-13 – 2015-02-15 (×5): 3 mL via INTRAVENOUS

## 2015-02-09 MED ORDER — ASPIRIN 81 MG PO CHEW
81.0000 mg | CHEWABLE_TABLET | Freq: Every day | ORAL | Status: DC
Start: 2015-02-10 — End: 2015-02-16
  Administered 2015-02-10 – 2015-02-16 (×7): 81 mg via ORAL
  Filled 2015-02-09 (×14): qty 1

## 2015-02-09 MED ORDER — CEFTRIAXONE SODIUM 1 G IJ SOLR
INTRAMUSCULAR | Status: AC
  Filled 2015-02-09: qty 10

## 2015-02-09 MED ORDER — DEXTROSE 5 % IV SOLN: 500.0000 mg | Freq: Once | INTRAVENOUS | Status: DC

## 2015-02-09 MED ORDER — HEPARIN SODIUM (PORCINE) 5000 UNIT/ML IJ SOLN: 5000.0000 [IU] | Freq: Three times a day (TID) | INTRAMUSCULAR | Status: DC

## 2015-02-09 MED ORDER — ALBUTEROL SULFATE (2.5 MG/3ML) 0.083% IN NEBU: 2.5000 mg | INHALATION_SOLUTION | RESPIRATORY_TRACT | Status: DC | PRN

## 2015-02-09 MED ORDER — AZITHROMYCIN 500 MG IV SOLR
INTRAVENOUS | Status: AC
  Filled 2015-02-09: qty 500

## 2015-02-09 NOTE — ED Notes (Signed)
MD at bedside to discuss results of testing. 

## 2015-02-09 NOTE — Progress Notes (Signed)
Report received From [facility]

## 2015-02-09 NOTE — ED Notes (Signed)
Patient transported to X-ray via Biomedical scientist per tech.  Pt pulling mask off intermittently b/c uncomfortable, at this time maintaining O2 sats >95%.  Placed on 6 liters nasal cannula for relief from mask.  Instructed to take deep breaths intermittently to oxygenate properly.

## 2015-02-09 NOTE — ED Provider Notes (Signed)
CSN: [id number]     Arrival date & time 02/09/15  1055 History   First MD Initiated Contact with Patient 02/09/15 1058     Chief Complaint  Patient presents with  . Nausea     (Consider location/radiation/quality/duration/timing/severity/associated sxs/prior Treatment) HPI Patient presents with concern of nausea, weakness. Symptoms began about 2 days ago, though prior to that time the patient has had mild ongoing weakness. Since onset symptoms of been progressive, with no vomiting, no diarrhea. There has been subjective fever as well. Patient is now 3 weeks status post most recent infusion of chemotherapy. Patient was diagnosed with subungual hematoma of the right great toe, and has had amputation of the digit, 2 months ago. He denies any dyspnea, chest pain. Since onset of weakness, no clear alleviating or exacerbating factors.  Past Medical History  Diagnosis Date  . Paroxysmal supraventricular tachycardia 05/24/2012    Described in the treadmill report; details are pending   . Anxiety 05/24/2012  . Malignant melanoma of right great toe 11/14/2014   Past Surgical History  Procedure Laterality Date  . Rotator cuff repair  2010    3 yrs ago   No family history on file. Social History  Substance Use Topics  . Smoking status: Never Smoker   . Smokeless tobacco: None  . Alcohol Use: No    Review of Systems  Constitutional:       Per HPI, otherwise negative  HENT:       Per HPI, otherwise negative  Respiratory:       Per HPI, otherwise negative  Cardiovascular:       Per HPI, otherwise negative  Gastrointestinal: Positive for nausea. Negative for vomiting.  Endocrine:       Negative aside from HPI  Genitourinary:       Neg aside from HPI   Musculoskeletal:       Per HPI, otherwise negative  Skin: Negative.   Neurological: Positive for weakness. Negative for syncope.      Allergies  Review of patient's allergies indicates no known allergies.  Home Medications    Prior to Admission medications   Medication Sig Start Date End Date Taking? Authorizing Provider  NON FORMULARY    Yes Historical Provider, MD  aspirin 81 MG tablet Take 81 mg by mouth daily.    Historical Provider, MD  dexamethasone (DECADRON) 4 MG tablet Starting on 8/6, take 4 pills a day for 3 days, then take 3 pills daily for 3 days, then 2 pills daily for 3 days, then 1 pill a day 01/23/15   [name], MD  diltiazem (CARDIZEM) 120 MG tablet Take 120 mg by mouth daily.    Historical Provider, MD  escitalopram (LEXAPRO) 10 MG tablet Take 1 tablet (10 mg total) by mouth daily. 12/17/14   Eliezer Bottom, NP  lidocaine-prilocaine (EMLA) cream Apply to affected area once 11/21/14   [name], MD  Multiple Vitamins-Minerals (MENS ONE DAILY PO) Take 1 tablet by mouth daily.     Historical Provider, MD  pravastatin (PRAVACHOL) 40 MG tablet Take 40 mg by mouth at bedtime.     Historical Provider, MD   BP 122/64 mmHg  Pulse 83  Temp(Src) 98.6 F (37 C) (Oral)  Resp 28  SpO2 89% Physical Exam  Constitutional: He is oriented to person, place, and time. He appears well-developed. He appears listless. He has a sickly appearance.  HENT:  Head: Normocephalic and atraumatic.  Eyes: Conjunctivae and EOM are normal.  Cardiovascular: Normal rate and regular rhythm.   Pulmonary/Chest: No stridor. Bradypnea noted. He has decreased breath sounds. He has no wheezes. He has no rhonchi.  Abdominal: He exhibits no distension.  Musculoskeletal: He exhibits no edema.  Neurological: He is oriented to person, place, and time. He appears listless.  Skin: Skin is warm and dry.  Psychiatric: He has a normal mood and affect.  Nursing note and vitals reviewed.  Initial exam notable for hypoxia, with room air 75% saturation. With nonrebreather mask, the patient increases to 95% oxygen saturation Cardiac 90 sinus normal  ED Course  Procedures (including critical care time) Labs Review Labs  Reviewed  COMPREHENSIVE METABOLIC PANEL - Abnormal; Notable for the following:    Sodium 132 (*)    Potassium 3.2 (*)    Chloride 95 (*)    Creatinine, Ser 0.60 (*)    Calcium 8.5 (*)    Total Protein 6.1 (*)    Albumin 3.2 (*)    All other components within normal limits  CBC WITH DIFFERENTIAL/PLATELET - Abnormal; Notable for the following:    RBC 3.80 (*)    Hemoglobin 12.0 (*)    HCT 35.2 (*)    RDW 16.7 (*)    Platelets 90 (*)    Band Neutrophils 21 (*)    All other components within normal limits  I-STAT ARTERIAL BLOOD GAS, ED - Abnormal; Notable for the following:    pH, Arterial 7.482 (*)    Bicarbonate 27.0 (*)    Acid-Base Excess 4.0 (*)    All other components within normal limits  BRAIN NATRIURETIC PEPTIDE  URINALYSIS, ROUTINE W REFLEX MICROSCOPIC (NOT AT [facility])  BLOOD GAS, ARTERIAL  I-STAT CG4 LACTIC ACID, ED  CBG MONITORING, ED   Chart review demonstrates the patient has recent diagnosis of subungual melanoma with stage III B, microscopic biopsy-positive in regional lymph node.  Imaging Review Dg Chest 2 View  02/09/2015   CLINICAL DATA:  Fever, weakness for 1 week  EXAM: CHEST  2 VIEW  COMPARISON:  02/09/2015  FINDINGS: Cardiomediastinal silhouette is stable. Persistent streaky infiltrate/ pneumonia in right lower lobe. No pulmonary edema. Bony thorax is unremarkable.  IMPRESSION: Persistent streaky infiltrate/ pneumonia right lower lobe. No pulmonary edema.   Electronically Signed   By: Lahoma Crocker M.D.   On: 02/09/2015 13:06   Ct Angio Chest Pe W/cm &/or Wo Cm  02/09/2015   CLINICAL DATA:  Hypoxia. Nausea, weakness, shortness of breath since chemotherapy 2 weeks ago.  EXAM: CT ANGIOGRAPHY CHEST WITH CONTRAST  TECHNIQUE: Multidetector CT imaging of the chest was performed using the standard protocol during bolus administration of intravenous contrast. Multiplanar CT image reconstructions and MIPs were obtained to evaluate the vascular anatomy.  CONTRAST:  173mL  OMNIPAQUE IOHEXOL 350 MG/ML SOLN  COMPARISON:  Chest x-ray earlier today.  FINDINGS: Heart is normal size. Aorta is normal caliber. No mediastinal, hilar, or axillary adenopathy. No filling defects in the pulmonary arteries to suggest pulmonary emboli. There are bilateral airspace opacities, most confluent in the lower lobes, right greater than left, but also present in the upper lobes with ground-glass opacities present. Findings most compatible with multifocal pneumonia. No pleural effusions.  Chest wall soft tissues are unremarkable. Imaging into the upper abdomen shows no acute findings.  No acute bony abnormality or focal bone lesion.  Review of the MIP images confirms the above findings.  IMPRESSION: No evidence of pulmonary embolus.  Bilateral airspace opacities, most confluent in the lower lobes but  also noted in the upper lobes concerning for multifocal pneumonia.   Electronically Signed   By: Rolm Baptise M.D.   On: 02/09/2015 14:05   Dg Chest Port 1 View  02/09/2015   CLINICAL DATA:  Shortness of breath, nausea and weakness  EXAM: PORTABLE CHEST - 1 VIEW  COMPARISON:  None.  FINDINGS: The heart size and mediastinal contours are within normal limits. Lungs are hypo aerated with crowding of the bronchovascular markings. Superimposed ill-defined right basilar airspace opacity is identified. The visualized skeletal structures are unremarkable.  IMPRESSION: Low volume exam with diffuse prominence of the interstitial markings and more focal ill-defined right lower lobe airspace opacity which could indicate atelectasis although early pneumonia could appear similar. If symptoms persist, consider PA and lateral chest radiographs obtained at full inspiration when the patient is clinically able.   Electronically Signed   By: Conchita Paris M.D.   On: 02/09/2015 11:40   I have personally reviewed and evaluated these images and lab results as part of my medical decision-making.   EKG  Interpretation   Date/Time:  Monday February 09 2015 11:34:23 EDT Ventricular Rate:  88 PR Interval:  170 QRS Duration: 90 QT Interval:  342 QTC Calculation: 413 R Axis:   24 Text Interpretation:  Normal sinus rhythm Normal ECG Sinus rhythm Artifact  Borderline ECG Confirmed by Carmin Muskrat  MD [phone]) on 02/09/2015  1:12:52 PM     On repeat exam patient appears better after fluid resuscitation. He remains hypoxic, requiring supplement oxygen, but now is saturating in the low 90s with 6 L nasal cannula. With his malignancy, concern for new hypoxia, CT scan will be performed.   On exam, CT scan results discussed with the patient and family members. No evidence for pulmonary embolism. Given concern for multifocal pneumonia, hypoxia, he will be admitted. MDM  Patient with ongoing therapy for melanoma presents with fatigue, nausea is found to have multifocal pneumonia with new oxygen requirement. No evidence for pulmonary embolism, bacteremia, sepsis. However, the patient required transfer for admission, ongoing evaluation, management of his new hypoxia, pneumonia.  Carmin Muskrat, MD 02/09/15 [phone]

## 2015-02-09 NOTE — ED Notes (Signed)
Called to pt room. Sp02 76% on RA. Placed pt on 6L [place] 83% was the highest Sp02. 100% NRB placed. 95% SPO2. MD at bedside. ABG pending.

## 2015-02-09 NOTE — ED Notes (Signed)
Right Radial Artery x1 attempt. No complications noted. Bleeding stopped. No complications noted.

## 2015-02-09 NOTE — ED Notes (Signed)
Urinal offered and emptied.  Pt informed on current results of testing, denies additional needs, appears more awake than previously, still with noted tremors in hands.

## 2015-02-09 NOTE — ED Notes (Signed)
MD at bedside. 

## 2015-02-09 NOTE — ED Notes (Signed)
Pt placed on bp, pulse ox and cardiac monitoring.

## 2015-02-09 NOTE — Progress Notes (Signed)
RT spoke with patient and family member about wearing CPAP. Pt stated that he has never had a sleep study and has never had to wear it. Pt is currently on a NRB and O2 sats were 91 when RT walked into the room. RT advised RN of the conversation.  RT will continue to monitor.

## 2015-02-09 NOTE — Progress Notes (Addendum)
ANTIBIOTIC CONSULT NOTE - INITIAL  Pharmacy Consult for Vancomycin and Zosyn Indication: rule out pneumonia  No Known Allergies  Patient Measurements:   Adjusted Body Weight:   Vital Signs: Temp: 98.6 F (37 C) (08/22 1235) Temp Source: Oral (08/22 1100) BP: 122/64 mmHg (08/22 1416) Pulse Rate: 83 (08/22 1416) Intake/Output from previous day:   Intake/Output from this shift: Total I/O In: -  Out: 300 [Urine:300]  Labs:  Recent Labs  02/09/15 1118  WBC 5.3  HGB 12.0*  PLT 90*  CREATININE 0.60*   CrCl cannot be calculated (Unknown ideal weight.). No results for input(s): VANCOTROUGH, VANCOPEAK, VANCORANDOM, GENTTROUGH, GENTPEAK, GENTRANDOM, TOBRATROUGH, TOBRAPEAK, TOBRARND, AMIKACINPEAK, AMIKACINTROU, AMIKACIN in the last 72 hours.   Microbiology: No results found for this or any previous visit (from the past 720 hour(s)).  Medical History: Past Medical History  Diagnosis Date  . Paroxysmal supraventricular tachycardia 05/24/2012    Described in the treadmill report; details are pending   . Anxiety 05/24/2012  . Malignant melanoma of right great toe 11/14/2014    Medications:   (Not in a hospital admission) Scheduled:  . azithromycin      . cefTRIAXone       Infusions:  . 0.9 % NaCl with KCl 40 mEq / L    . cefTAZidime (FORTAZ)  IV    . vancomycin     Followed by  . vancomycin     PRN:   Assessment: 49yo male presents to [facility] with nausea. Pharmacy is consulted to dose vancomycin for pneumonia and renally adjust other antibiotics. Pt is afebrile, WBC wnl, sCr 0.6.  Goal of Therapy:  Vancomycin trough level 15-20 mcg/ml  Plan:  Vancomycin 2g IV load followed by 1g q8h Ceftazidime 2g IV q8h Expected duration 7 days with resolution of temperature and/or normalization of WBC Measure antibiotic drug levels at steady state Follow up culture results, renal function and clinical course  Andrey Cota. Diona Foley, PharmD Clinical Pharmacist Pager  [phone] 02/09/2015,3:17 PM _______________________________________________________ ADDENDUM: Pharmacy now consulted to dose Zosyn (ceftazidime discontinued)  8/22 BCx2 >> pending  Ceftazidime 8/22 x1 Vanc 8/22 >> (full loading dose not completed) Zosyn 8/22 >>   Plan: -Zosyn 3.375g IV Q8h (4hr infusion) -Continue Vanc 1g IV Q8h (adjusted to start now, full loading dose not given) -Monitor C&S, renal fxn, vanc levels as appropriate  Drucie Opitz, PharmD Clinical Pharmacist Pager: [phone] 02/09/2015 8:13 PM

## 2015-02-09 NOTE — H&P (Signed)
[facility] History and Physical  James Byrd [id number] DOB: May 13, 1966 DOA: 02/09/2015  Referring physician: ED physician PCP: Ann Held, MD  Specialists:   Chief Complaint: Generalized weakness, fever, nausea, cough and shortness of breath  HPI: James Byrd is a 49 y.o. male with PMH of SVT, hyperlipidemia, anxiety, right great toe melanoma (post status of amputation, on chemotherapy), diastolic congestive heart failure (EF 60-65% with grade 2 diastolic distortion), hyperlipidemia, thrombocytopenia, who presents with generalized weakness, fever, nausea, cough and shortness breath.  Patient reports that in the past 2 days, he has been feeling very weak, and nauseated. He also has shortness of breath, and mild cough without sputum production, no chest pain. He does not have vomiting, diarrhea or abdominal pain. He has subjective fever and chills. Patient does not have symptoms of UTI, rashes, unilateral weakness. Patient states that sometimes he chokes when he eats solid foods.  Of note, he is receiving chemotherapy for melanoma, and completed 3 cycles of chemotherapy on 01/26/15. He is supposed to do 4 cycles of chemotherapy.  In ED, patient was found to have WBC 5.3, lactate 0.93, BNP 17, negative urinalysis, temperature 99.2, oxygen saturation 82 on room air, tachypnea, no tachycardia, potassium is 3.2. X-ray showed right lower lobe infiltration. CT angiogram of chest is negative for PE, but showed multiple nutrition.   Where does patient live?   At home    Can patient participate in ADLs?  Yes    Review of Systems: has fevers, chills, no changes in body weight, has poor appetite, has fatigue HEENT: no blurry vision, hearing changes or sore throat Pulm: has dyspnea, coughing, no wheezing CV: no chest pain, palpitations Abd: has nausea, no vomiting, abdominal pain, diarrhea, constipation GU: no dysuria, burning on urination, increased urinary frequency, hematuria  Ext:  no leg edema Neuro: no unilateral weakness, numbness, or tingling, no vision change or hearing loss Skin: no rash MSK: No muscle spasm, no deformity, no limitation of range of movement in spin Heme: No easy bruising.  Travel history: No recent long distant travel.  Allergy: No Known Allergies  Past Medical History  Diagnosis Date  . Paroxysmal supraventricular tachycardia 05/24/2012    Described in the treadmill report; details are pending   . Anxiety 05/24/2012  . Malignant melanoma of right great toe 11/14/2014  . HLD (hyperlipidemia)   . Chronic diastolic congestive heart failure     Past Surgical History  Procedure Laterality Date  . Rotator cuff repair  2010    3 yrs ago    Social History:  reports that he has never smoked. He does not have any smokeless tobacco history on file. He reports that he does not drink alcohol or use illicit drugs.  Family History:  Family History  Problem Relation Age of Onset  . Stroke Mother   . COPD Father   . Diabetes Father   . Diabetes Sister   . Diabetes Brother      Prior to Admission medications   Medication Sig Start Date End Date Taking? Authorizing Provider  NON FORMULARY    Yes Historical Provider, MD  aspirin 81 MG tablet Take 81 mg by mouth daily.    Historical Provider, MD  dexamethasone (DECADRON) 4 MG tablet Starting on 8/6, take 4 pills a day for 3 days, then take 3 pills daily for 3 days, then 2 pills daily for 3 days, then 1 pill a day 01/23/15   [name], MD  diltiazem (CARDIZEM) 120 MG tablet  Take 120 mg by mouth daily.    Historical Provider, MD  escitalopram (LEXAPRO) 10 MG tablet Take 1 tablet (10 mg total) by mouth daily. 12/17/14   Eliezer Bottom, NP  lidocaine-prilocaine (EMLA) cream Apply to affected area once 11/21/14   [name], MD  Multiple Vitamins-Minerals (MENS ONE DAILY PO) Take 1 tablet by mouth daily.     Historical Provider, MD  pravastatin (PRAVACHOL) 40 MG tablet Take 40 mg by mouth at  bedtime.     Historical Provider, MD    Physical Exam: Filed Vitals:   02/09/15 1707 02/09/15 1730 02/09/15 1800 02/09/15 1915  BP:  138/71 129/75 118/74  Pulse:  87 88 89  Temp:    100.2 F (37.9 C)  TempSrc:    Oral  Resp:  35 20 24  SpO2: 95% 96% 95% 96%   General: Not in acute distress. Looks tired. HEENT:       Eyes: PERRL, EOMI, no scleral icterus.       ENT: No discharge from the ears and nose, no pharynx injection, no tonsillar enlargement.        Neck: No JVD, no bruit, no mass felt. Heme: No neck lymph node enlargement. Cardiac: S1/S2, RRR, No murmurs, No gallops or rubs. Pulm:  No rales, wheezing, rhonchi or rubs. Abd: Soft, nondistended, nontender, no rebound pain, no organomegaly, BS present. Ext: No pitting leg edema bilaterally. 2+DP/PT pulse bilaterally. S/p of R great toe. Musculoskeletal: No joint deformities, No joint redness or warmth, no limitation of ROM in spin. Skin: No rashes.  Neuro: Alert, oriented X3, cranial nerves II-XII grossly intact, muscle strength 5/5 in all extremities, sensation to light touch intact.  Psych: Patient is not psychotic, no suicidal or hemocidal ideation.  Labs on Admission:  Basic Metabolic Panel:  Recent Labs Lab 02/05/15 1339 02/09/15 1118  NA 136 132*  K 3.7 3.2*  CL 101 95*  CO2 25 25  GLUCOSE 83 89  BUN 20 10  CREATININE 0.8 0.60*  CALCIUM 8.8 8.5*   Liver Function Tests:  Recent Labs Lab 02/05/15 1339 02/09/15 1118  AST 21 16  ALT 45 41  ALKPHOS 37 38  BILITOT 0.80 1.2  PROT 5.9* 6.1*  ALBUMIN  --  3.2*   No results for input(s): LIPASE, AMYLASE in the last 168 hours. No results for input(s): AMMONIA in the last 168 hours. CBC:  Recent Labs Lab 02/05/15 1337 02/09/15 1118  WBC 8.6 5.3  NEUTROABS 7.3* 4.3  HGB 13.2 12.0*  HCT 37.6* 35.2*  MCV 92 92.6  PLT 94* 90*   Cardiac Enzymes: No results for input(s): CKTOTAL, CKMB, CKMBINDEX, TROPONINI in the last 168 hours.  BNP (last 3  results)  Recent Labs  02/09/15 1118  BNP 17.0    ProBNP (last 3 results) No results for input(s): PROBNP in the last 8760 hours.  CBG:  Recent Labs Lab 02/09/15 1146  GLUCAP 83    Radiological Exams on Admission: Dg Chest 2 View  02/09/2015   CLINICAL DATA:  Fever, weakness for 1 week  EXAM: CHEST  2 VIEW  COMPARISON:  02/09/2015  FINDINGS: Cardiomediastinal silhouette is stable. Persistent streaky infiltrate/ pneumonia in right lower lobe. No pulmonary edema. Bony thorax is unremarkable.  IMPRESSION: Persistent streaky infiltrate/ pneumonia right lower lobe. No pulmonary edema.   Electronically Signed   By: Lahoma Crocker M.D.   On: 02/09/2015 13:06   Ct Angio Chest Pe W/cm &/or Wo Cm  02/09/2015  CLINICAL DATA:  Hypoxia. Nausea, weakness, shortness of breath since chemotherapy 2 weeks ago.  EXAM: CT ANGIOGRAPHY CHEST WITH CONTRAST  TECHNIQUE: Multidetector CT imaging of the chest was performed using the standard protocol during bolus administration of intravenous contrast. Multiplanar CT image reconstructions and MIPs were obtained to evaluate the vascular anatomy.  CONTRAST:  152mL OMNIPAQUE IOHEXOL 350 MG/ML SOLN  COMPARISON:  Chest x-ray earlier today.  FINDINGS: Heart is normal size. Aorta is normal caliber. No mediastinal, hilar, or axillary adenopathy. No filling defects in the pulmonary arteries to suggest pulmonary emboli. There are bilateral airspace opacities, most confluent in the lower lobes, right greater than left, but also present in the upper lobes with ground-glass opacities present. Findings most compatible with multifocal pneumonia. No pleural effusions.  Chest wall soft tissues are unremarkable. Imaging into the upper abdomen shows no acute findings.  No acute bony abnormality or focal bone lesion.  Review of the MIP images confirms the above findings.  IMPRESSION: No evidence of pulmonary embolus.  Bilateral airspace opacities, most confluent in the lower lobes but also  noted in the upper lobes concerning for multifocal pneumonia.   Electronically Signed   By: Rolm Baptise M.D.   On: 02/09/2015 14:05   Dg Chest Port 1 View  02/09/2015   CLINICAL DATA:  Shortness of breath, nausea and weakness  EXAM: PORTABLE CHEST - 1 VIEW  COMPARISON:  None.  FINDINGS: The heart size and mediastinal contours are within normal limits. Lungs are hypo aerated with crowding of the bronchovascular markings. Superimposed ill-defined right basilar airspace opacity is identified. The visualized skeletal structures are unremarkable.  IMPRESSION: Low volume exam with diffuse prominence of the interstitial markings and more focal ill-defined right lower lobe airspace opacity which could indicate atelectasis although early pneumonia could appear similar. If symptoms persist, consider PA and lateral chest radiographs obtained at full inspiration when the patient is clinically able.   Electronically Signed   By: Conchita Paris M.D.   On: 02/09/2015 11:40    EKG: Independently reviewed. No ischemic change  Assessment/Plan Principal Problem:   HCAP (healthcare-associated pneumonia) Active Problems:   Paroxysmal supraventricular tachycardia   Anxiety   Obstructive sleep apnea   Malignant melanoma of right great toe   Hypokalemia   HLD (hyperlipidemia)   Chronic diastolic congestive heart failure   Thrombocytopenia  HCAP (healthcare-associated pneumonia): As evidenced by CT angiogram and chest x-ray. Patient states that he chokes when he eats solid food sometimes, indicating possible aspiration pneumonia. Patient is not septic on admission, hemodynamically stable.  - Will admit to Telemetry Bed - IV Vancomycin and Zosyn (patient received 1 dose of Fortaz in emergency room, will switch to Zosyn given possible aspiration pneumonia). - Mucinex for cough  -albuterol Neb prn for SOB - Urine legionella and S. pneumococcal antigen - Follow up blood culture x2, sputum culture, plus Flu pcr -  will get Procalcitonin and trend lactic acid  - IVF: 1L of NS bolus in ED, followed by 100 mL per hour of NS (patient has a diastolic congestive heart failure, limiting aggressive IV fluids treatment) -NPO until SLP done  Paroxysmal supraventricular tachycardia: Heart rate is well controlled. -Continue Cardizem  Anxiety: Stable, no suicidal or homicidal ideations. -Continue home medications: Lexapro  Hypokalemia: K= 3.2 on admission. - Repleted - Check Mg level  HLD: Last LDL was not on record -Continue home medications: Pravastatin -Check FLP  Chronic diastolic congestive heart failure: 2-D echo on 06/01/12 showed EF 60-65% with  grade 2 diastolic dysfunction. Patient is not on diuretics at home, CHF is compensated, no leg edema, BNP is 17.0. -Watch volume status carefully, while patient is on IV fluid   Thrombocytopenia: Platelet 90, most likely due to chemotherapy. No bleeding tendency. -Follow-up CBC  Malignant melanoma of right great toe:  Stage IIIB [id number]) subungual melanoma of the right hallux. s/p of amputation. Being treated by Dr. Marin Olp and last seen was 01/23/15. Completed the third cycle of chemotherapy on 02/05/15. -Follow-up with Dr. Marin Olp  OSA: -CPAP   DVT ppx: SCD Code Status: Full code Family Communication: None at bed side.   Disposition Plan: Admit to inpatient   Date of Service 02/09/2015    Ivor Costa [facility] Pager [phone]  If 7PM-7AM, please contact night-coverage [email or web address] Password [facility] 02/09/2015, 8:49 PM

## 2015-02-09 NOTE — ED Notes (Signed)
Pt reports 2-3 days of nausea no vomiting or diarrhea, subjective fevers.  Reports feels weak and dehydrated.  Currently on chemo med x 12 weeks for malignant melanoma.

## 2015-02-09 NOTE — ED Notes (Signed)
Portable cxr to bedside

## 2015-02-09 NOTE — ED Notes (Signed)
Placed on nrb per rt.

## 2015-02-09 NOTE — Progress Notes (Signed)
Patient arrived to nursing on 100% non-rebrather. Notified rapid response to place patient on radar. Patient is not in any distress

## 2015-02-10 ENCOUNTER — Ambulatory Visit: Payer: BLUE CROSS/BLUE SHIELD

## 2015-02-10 DIAGNOSIS — J189 Pneumonia, unspecified organism: Principal | ICD-10-CM

## 2015-02-10 DIAGNOSIS — R918 Other nonspecific abnormal finding of lung field: Secondary | ICD-10-CM

## 2015-02-10 LAB — INFLUENZA PANEL BY PCR (TYPE A & B)
H1N1FLUPCR: NOT DETECTED
Influenza A By PCR: NEGATIVE
Influenza B By PCR: NEGATIVE

## 2015-02-10 LAB — BASIC METABOLIC PANEL
ANION GAP: 9 (ref 5–15)
BUN: <5 mg/dL — ABNORMAL LOW (ref 6–20)
CHLORIDE: 96 mmol/L — AB (ref 101–111)
CO2: 25 mmol/L (ref 22–32)
Calcium: 8.2 mg/dL — ABNORMAL LOW (ref 8.9–10.3)
Creatinine, Ser: 0.65 mg/dL (ref 0.61–1.24)
GFR calc Af Amer: >60 mL/min (ref >60)
GLUCOSE: 98 mg/dL (ref 65–99)
POTASSIUM: 3.2 mmol/L — AB (ref 3.5–5.1)
Sodium: 130 mmol/L — ABNORMAL LOW (ref 135–145)

## 2015-02-10 LAB — LIPID PANEL
Cholesterol: 189 mg/dL (ref 0–200)
HDL: 41 mg/dL (ref >40)
LDL CALC: 103 mg/dL — AB (ref 0–99)
Total CHOL/HDL Ratio: 4.6 RATIO
Triglycerides: 227 mg/dL — ABNORMAL HIGH (ref <150)
VLDL: 45 mg/dL — AB (ref 0–40)

## 2015-02-10 LAB — LACTIC ACID, PLASMA: Lactic Acid, Venous: 0.6 mmol/L (ref 0.5–2.0)

## 2015-02-10 LAB — PROCALCITONIN: Procalcitonin: 0.22 ng/mL

## 2015-02-10 LAB — CBC
HEMATOCRIT: 32.5 % — AB (ref 39.0–52.0)
HEMOGLOBIN: 11.3 g/dL — AB (ref 13.0–17.0)
MCH: 31.5 pg (ref 26.0–34.0)
MCHC: 34.8 g/dL (ref 30.0–36.0)
MCV: 90.5 fL (ref 78.0–100.0)
PLATELETS: 79 10*3/uL — AB (ref 150–400)
RBC: 3.59 MIL/uL — AB (ref 4.22–5.81)
RDW: 16.5 % — ABNORMAL HIGH (ref 11.5–15.5)
WBC: 4.6 10*3/uL (ref 4.0–10.5)

## 2015-02-10 LAB — GLUCOSE, CAPILLARY: GLUCOSE-CAPILLARY: 84 mg/dL (ref 65–99)

## 2015-02-10 LAB — MAGNESIUM: Magnesium: 1.7 mg/dL (ref 1.7–2.4)

## 2015-02-10 LAB — SEDIMENTATION RATE: Sed Rate: 82 mm/hr — ABNORMAL HIGH (ref 0–16)

## 2015-02-10 MED ORDER — POTASSIUM CHLORIDE CRYS ER 20 MEQ PO TBCR
40.0000 meq | EXTENDED_RELEASE_TABLET | Freq: Once | ORAL | Status: AC
  Administered 2015-02-10: 40 meq via ORAL
  Filled 2015-02-10: qty 2

## 2015-02-10 MED ORDER — GUAIFENESIN ER 600 MG PO TB12
1200.0000 mg | ORAL_TABLET | Freq: Two times a day (BID) | ORAL | Status: DC
  Administered 2015-02-10 – 2015-02-16 (×12): 1200 mg via ORAL
  Filled 2015-02-10 (×13): qty 2

## 2015-02-10 NOTE — Consult Note (Addendum)
Name: James Byrd MRN: [id number] DOB: 1965/06/25    ADMISSION DATE:  02/09/2015 CONSULTATION DATE:  8/23  REFERRING MD :  Elgergawy   CHIEF COMPLAINT:  Hypoxic respiratory failure   BRIEF PATIENT DESCRIPTION:  49 y.o. male with PMH of SVT, hyperlipidemia, anxiety, right great toe melanoma (post status of amputation, on chemotherapy [last round 3/5]), diastolic congestive heart failure (EF 60-65% with grade 2 diastolic distortion), hyperlipidemia, thrombocytopenia, who was  Admitted 8/22 with working dx of acute hypoxic resp failure in the setting of presumed CAP. PCCM asked to see 8/23 given high FIo2 requirements.   SIGNIFICANT EVENTS    STUDIES:  8/22 CT chest: Bilateral airspace opacities, most confluent in the lower lobes but also noted in the upper lobes concerning for multifocal pneumonia.  HISTORY OF PRESENT ILLNESS:    49 y.o. male with PMH of SVT, hyperlipidemia, anxiety, right great toe melanoma (post status of amputation, on chemotherapy), diastolic congestive heart failure (EF 60-65% with grade 2 diastolic distortion), hyperlipidemia, thrombocytopenia, who presented 8/22 with generalized weakness, fever, nausea, cough and shortness breath. Patient reported that in the past 2 days prior to admit, he has been feeling very weak, and nauseated. He also has shortness of breath, and mild cough without sputum production, no chest pain. Did vomit. He has subjective fever and chills. Patient does not have symptoms of UTI, rashes, unilateral weakness. Patient states that sometimes he chokes when he eats solid foods. He has completed 3 cycles of chemotherapy on 01/26/15. He is supposed to do 4 cycles of chemotherapy.  In ED, patient was found to have WBC 5.3, lactate 0.93, BNP 17, negative urinalysis, temperature 99.2, oxygen saturation 82 on room air, tachypnea, no tachycardia, potassium is 3.2. X-ray showed right lower lobe infiltration. CT angiogram of chest is negative for PE, but  showed multiple infiltrates. He was admitted to the medical service. Blood cultures have been sent. Influenza panel was negative, U strep was negative and U legionella sent and remains pending. He was started on Vanc and zosyn, placed on oxygen and admitted to the floor. As of 8/23 he feels better but still continues to rapidly desaturate when moved off from 100% NRB. PCCM was asked to see re: his hypoxic resp failure.    PAST MEDICAL HISTORY :   has a past medical history of Paroxysmal supraventricular tachycardia (05/24/2012); Anxiety (05/24/2012); Malignant melanoma of right great toe (11/14/2014); HLD (hyperlipidemia); and Chronic diastolic congestive heart failure.  has past surgical history that includes Rotator cuff repair (2010); Rotator cuff repair; Ablation; and r toe partial ambutation. Prior to Admission medications   Medication Sig Start Date End Date Taking? Authorizing Provider  acetaminophen (TYLENOL) 500 MG tablet Take 500 mg by mouth every 6 (six) hours as needed for mild pain.   Yes Historical Provider, MD  aspirin 81 MG tablet Take 81 mg by mouth daily.   Yes Historical Provider, MD  diltiazem (CARDIZEM) 120 MG tablet Take 120 mg by mouth daily.   Yes Historical Provider, MD  escitalopram (LEXAPRO) 10 MG tablet Take 1 tablet (10 mg total) by mouth daily. 12/17/14  Yes Eliezer Bottom, NP  Multiple Vitamins-Minerals (MENS ONE DAILY PO) Take 1 tablet by mouth daily.    Yes Historical Provider, MD  pravastatin (PRAVACHOL) 40 MG tablet Take 40 mg by mouth at bedtime.    Yes Historical Provider, MD  NON FORMULARY     Historical Provider, MD   No Known Allergies  FAMILY HISTORY:  family  history includes COPD in his father; Diabetes in his brother, father, and sister; Stroke in his mother. SOCIAL HISTORY:  reports that he has never smoked. He does not have any smokeless tobacco history on file. He reports that he does not drink alcohol or use illicit drugs.  REVIEW OF SYSTEMS  (bolds are positive):   Constitutional: Negative for fever, chills, weight loss, malaise/fatigue and diaphoresis.  HENT: Negative for hearing loss, ear pain, nosebleeds, congestion, sore throat, neck pain, tinnitus and ear discharge.   Eyes: Negative for blurred vision, double vision, photophobia, pain, discharge and redness.  Respiratory:cough, hemoptysis, sputum production, shortness of breath, wheezing and stridor.   Cardiovascular: Negative for chest pain, palpitations, orthopnea, claudication, leg swelling and PND.  Gastrointestinal: Negative for heartburn, nausea, vomiting, abdominal pain, diarrhea, constipation, blood in stool and melena.  Genitourinary: Negative for dysuria, urgency, frequency, hematuria and flank pain.  Musculoskeletal: Negative for myalgias, back pain, joint pain and falls.  Skin: Negative for itching and rash.  Neurological: Negative for dizziness, tingling, tremors, sensory change, speech change, focal weakness, seizures, loss of consciousness, weakness and headaches.  Endo/Heme/Allergies: Negative for environmental allergies and polydipsia. Does not bruise/bleed easily.  SUBJECTIVE:  Feels better  VITAL SIGNS: Temp:  [98.6 F (37 C)-100.2 F (37.9 C)] 98.6 F (37 C) (08/23 1252) Pulse Rate:  [83-97] 92 (08/23 1356) Resp:  [18-35] 20 (08/23 1252) BP: (109-138)/(70-75) 109/70 mmHg (08/23 1252) SpO2:  [86 %-97 %] 86 % (08/23 1356) FiO2 (%):  [70 %-100 %] 70 % (08/23 1213) Weight:  [116.121 kg (256 lb)-116.393 kg (256 lb 9.6 oz)] 116.393 kg (256 lb 9.6 oz) (08/23 0536)  PHYSICAL EXAMINATION: General:  Obese 49 year old male, resting in chair and in no acute distress.  Neuro:  Awake, alert, no focal def  HEENT:  NCAT, neck large  Cardiovascular:  rrr Lungs: posterior rales bilaterally, no accessory muscle use  Abdomen:  Soft, non-tender + bowel sounds  Musculoskeletal:  Intact  Skin:  No edema   CBC Recent Labs     02/09/15  1118  02/10/15  0630  WBC   5.3  4.6  HGB  12.0*  11.3*  HCT  35.2*  32.5*  PLT  90*  79*    Coag's Recent Labs     02/09/15  2127  APTT  33  INR  1.07    BMET Recent Labs     02/09/15  1118  02/10/15  0630  NA  132*  130*  K  3.2*  3.2*  CL  95*  96*  CO2  25  25  BUN  10  <5*  CREATININE  0.60*  0.65  GLUCOSE  89  98    Electrolytes Recent Labs     02/09/15  1118  02/10/15  0630  CALCIUM  8.5*  8.2*  MG   --   1.7    Sepsis Markers Recent Labs     02/09/15  2127  PROCALCITON  0.14    ABG Recent Labs     02/09/15  1129  PHART  7.482*  PCO2ART  36.2  PO2ART  83.0    Liver Enzymes Recent Labs     02/09/15  1118  AST  16  ALT  41  ALKPHOS  38  BILITOT  1.2  ALBUMIN  3.2*    Cardiac Enzymes No results for input(s): TROPONINI, PROBNP in the last 72 hours.  Glucose Recent Labs     02/09/15  1146  02/10/15  0814  GLUCAP  83  84    Imaging Dg Chest 2 View  02/09/2015   CLINICAL DATA:  Fever, weakness for 1 week  EXAM: CHEST  2 VIEW  COMPARISON:  02/09/2015  FINDINGS: Cardiomediastinal silhouette is stable. Persistent streaky infiltrate/ pneumonia in right lower lobe. No pulmonary edema. Bony thorax is unremarkable.  IMPRESSION: Persistent streaky infiltrate/ pneumonia right lower lobe. No pulmonary edema.   Electronically Signed   By: Lahoma Crocker M.D.   On: 02/09/2015 13:06   Ct Angio Chest Pe W/cm &/or Wo Cm  02/09/2015   CLINICAL DATA:  Hypoxia. Nausea, weakness, shortness of breath since chemotherapy 2 weeks ago.  EXAM: CT ANGIOGRAPHY CHEST WITH CONTRAST  TECHNIQUE: Multidetector CT imaging of the chest was performed using the standard protocol during bolus administration of intravenous contrast. Multiplanar CT image reconstructions and MIPs were obtained to evaluate the vascular anatomy.  CONTRAST:  131m OMNIPAQUE IOHEXOL 350 MG/ML SOLN  COMPARISON:  Chest x-ray earlier today.  FINDINGS: Heart is normal size. Aorta is normal caliber. No mediastinal, hilar, or  axillary adenopathy. No filling defects in the pulmonary arteries to suggest pulmonary emboli. There are bilateral airspace opacities, most confluent in the lower lobes, right greater than left, but also present in the upper lobes with ground-glass opacities present. Findings most compatible with multifocal pneumonia. No pleural effusions.  Chest wall soft tissues are unremarkable. Imaging into the upper abdomen shows no acute findings.  No acute bony abnormality or focal bone lesion.  Review of the MIP images confirms the above findings.  IMPRESSION: No evidence of pulmonary embolus.  Bilateral airspace opacities, most confluent in the lower lobes but also noted in the upper lobes concerning for multifocal pneumonia.   Electronically Signed   By: KRolm BaptiseM.D.   On: 02/09/2015 14:05   Dg Chest Port 1 View  02/09/2015   CLINICAL DATA:  Shortness of breath, nausea and weakness  EXAM: PORTABLE CHEST - 1 VIEW  COMPARISON:  None.  FINDINGS: The heart size and mediastinal contours are within normal limits. Lungs are hypo aerated with crowding of the bronchovascular markings. Superimposed ill-defined right basilar airspace opacity is identified. The visualized skeletal structures are unremarkable.  IMPRESSION: Low volume exam with diffuse prominence of the interstitial markings and more focal ill-defined right lower lobe airspace opacity which could indicate atelectasis although early pneumonia could appear similar. If symptoms persist, consider PA and lateral chest radiographs obtained at full inspiration when the patient is clinically able.   Electronically Signed   By: GConchita ParisM.D.   On: 02/09/2015 11:40   Reviewed + bilateral LE infiltrates     ASSESSMENT / PLAN:  Acute Hypoxic respiratory failure in the setting of bilateral pulmonary infiltrates in an potentially immunocompromised host. ddx- most likely CAP vs aspiration, additionally consider viral pneumonitis vs drug related pneumonitis  given h/o chemo, but doubt given timing and clinical improvement since admission. He has h/o diastolic HF..Marland KitchenHis BNP was only 17 and he appears euvolemic at this point so doubt pulmonary edema   Plan Continue current abx Trend PCT KVO IVF F/u resp viral panel Change oxygen to high flow nasal canula and wean as able.   Melenoma, anemia, hyponatremia, hypokalemia  Plan Per IM services   PErick ColaceACNP-BC LHawk SpringsPager # [phone] # [phone] no answer  Attending Note:  48year old male with multiple myeloma on chemo who presents with SOB and hypoxemic respiratory  failure.  I reviewed chest CT myself with R>L pulmonary infiltrate.  Discussed with TRH-MD and PCCM-NP.  Bibasilar crackles noted on exam.  Feels better compared to admission.  Pulmonary Infiltrate: likely PNA.  - Continue abx.  - F/U on culture.  - F/U procalcitonin.  - Repeat imaging in AM.  PNA:  - Vanc/zosyn as ordered.  - F/u on culture.  - If no improvement then will need bronch for opportunistic infections.  Hypoxemic respiratory failure: not a PE.  - Treat infection.  - Titrate O2 for sat of 88-92%.  - Will likely need ambulatory desaturation study prior to discharge, will likely need home O2.  Possibility of opportunistic infection given chemo.  - Treat with broad spectrum anti-bacterial for now.  - Will likely bronchoscope if no improvement.  OSA by history.  - CPAP while asleep.  Patient seen and examined, agree with above note.  I dictated the care and orders written for this patient under my direction.  Rush Farmer, MD [phone]  02/10/2015, 3:10 PM

## 2015-02-10 NOTE — Progress Notes (Signed)
RT Note: Patient was placed on HFNC per MD order. We started with 70% O2 per MD order with a flow of 30L but patient complained that the flow was too high and he could not tolerate it. On those settings his Spo2 would not come up above 88% so titration was required to reach goal SpO2. Flow was lowered to 25L and patient felt more comfortable on that flow and he stated that he could tolerate that better. Because the flow had to be lowered his Fio2 had to be increased. It was done gradually to use the lowest Fio2 possible but he could not maintain a goal Spo2 of 92% without being on 100% Fio2. He is tolerating those settings now and his Spo2 is maintaining at 92%. RT stayed in the room with the patient for approx. 30 minutes to ensure that he would maintain on that and was comfortable. His settings are: HFNC, Flow- 25L, Fio2- 100%. BBS-equally diminished and clear. No apparent distress is noted and RT will continue to monitor.

## 2015-02-10 NOTE — Evaluation (Signed)
Occupational Therapy Evaluation Patient Details Name: James Byrd MRN: [id number] DOB: 1966-06-03 Today's Date: 02/10/2015    History of Present Illness 49 yo male admitted with weakness nausea and SOB PMH CA melonoma  receiving chemothereapy   Clinical Impression   PT admitted with SOB and weakness. Pt currently with functional limitiations due to the deficits listed below (see OT problem list). PTA living at home working full time with recent chemotherapy treatments Pt will benefit from skilled OT to increase their independence and safety with adls and balance to allow discharge Senath.     Follow Up Recommendations  Home health OT    Equipment Recommendations  Tub/shower seat    Recommendations for Other Services       Precautions / Restrictions Precautions Precautions: Fall Precaution Comments: requires oxygen      Mobility Bed Mobility               General bed mobility comments: in chair on arrival s/p PT session  Transfers Overall transfer level: Needs assistance                    Balance                                            ADL Overall ADL's : Needs assistance/impaired     Grooming: Min guard;Sitting       Lower Body Bathing: Bed level;Moderate assistance (sitting in chAIR CROSSING LEGS) Lower Body Bathing Details (indicate cue type and reason): pt can cross  Upper Body Dressing : Min guard;Sitting                     General ADL Comments: educated on energy conservation . pt educated on chair level strengthing exercise     Vision Vision Assessment?: No apparent visual deficits   Perception     Praxis      Pertinent Vitals/Pain Pain Assessment: No/denies pain     Hand Dominance Right   Extremity/Trunk Assessment Upper Extremity Assessment Upper Extremity Assessment: Generalized weakness   Lower Extremity Assessment Lower Extremity Assessment: Defer to PT evaluation   Cervical / Trunk  Assessment Cervical / Trunk Assessment: Normal   Communication Communication Communication: No difficulties   Cognition Arousal/Alertness: Awake/alert Behavior During Therapy: WFL for tasks assessed/performed Overall Cognitive Status: Within Functional Limits for tasks assessed                     General Comments       Exercises       Shoulder Instructions      Home Living Family/patient expects to be discharged to:: Private residence Living Arrangements: Spouse/significant other (works) Available Help at Discharge: Family;Available PRN/intermittently Type of Home: House Home Access: Stairs to enter [employer] of Steps: 2 Entrance Stairs-Rails: None Home Layout: One level     Bathroom Shower/Tub: Teacher, early years/pre: Standard     Home Equipment: Crutches          Prior Functioning/Environment Level of Independence: Independent        Comments: drives, 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, research (medical))    OT Diagnosis: Generalized weakness   OT Problem List: Decreased strength;Decreased activity tolerance;Impaired balance (sitting and/or standing);Decreased knowledge of use of DME or AE;Decreased safety awareness;Decreased knowledge of precautions;Cardiopulmonary status limiting activity   OT Treatment/Interventions: Self-care/ADL  training;Therapeutic exercise;DME and/or AE instruction;Therapeutic activities;Patient/family education;Balance training    OT Goals(Current goals can be found in the care plan section) Acute Rehab OT Goals Patient Stated Goal: to return to working OT Goal Formulation: With patient Time For Goal Achievement: 02/24/15 Potential to Achieve Goals: Good ADL Goals Pt Will Perform Upper Body Bathing: with modified independence;sitting (ec technique) Pt Will Perform Lower Body Bathing: with modified independence;sit to/from stand (ec technique) Pt Will Transfer to Toilet: with modified  independence;ambulating;regular height toilet Pt Will Perform Toileting - Clothing Manipulation and hygiene: with modified independence;sit to/from stand Pt Will Perform Tub/Shower Transfer: Tub transfer;ambulating;tub bench  OT Frequency: Min 2X/week   Barriers to D/C:            Co-evaluation              End of Session Equipment Utilized During Treatment: Gait belt Nurse Communication: Mobility status;Precautions  Activity Tolerance: Other (comment) (decr oxygen ) Patient left: in chair;with call bell/phone within reach   Time: 1435-1500 OT Time Calculation (min): 25 min Charges:  OT General Charges $OT Visit: 1 Procedure OT Evaluation $Initial OT Evaluation Tier I: 1 Procedure G-Codes:    Peri Maris 03-08-2015, 3:06 PM   Jeri Modena   OTR/L PagerOH:[phone] Office: [phone] .

## 2015-02-10 NOTE — Progress Notes (Addendum)
Patient Demographics  James Byrd, is a 49 y.o. male, DOB - 1965/12/30, [id number]  Admit date - 02/09/2015   Admitting Physician Ivor Costa, MD  Outpatient Primary MD for the patient is Ann Held, MD  LOS - 1   Chief Complaint  Patient presents with  . Nausea         Subjective:   [name] today has, No headache, No chest pain, No abdominal pain - No Nausea, No new weakness tingling or numbness, complaints of shortness of breath , mild cough .  Assessment & Plan    Principal Problem:   HCAP (healthcare-associated pneumonia) Active Problems:   Paroxysmal supraventricular tachycardia   Anxiety   Obstructive sleep apnea   Malignant melanoma of right great toe   Hypokalemia   HLD (hyperlipidemia)   Chronic diastolic congestive heart failure   Thrombocytopenia  Acute hypoxic respiratory failure - Secondary to pneumonia - Continue with oxygen, pulmonary will try high flow nasal cannula nystatin under breather - CTA chest negative for PE  HCAP (healthcare-associated pneumonia):  - IV Vancomycin and Zosyn (patient received 1 dose of Fortaz in emergency room) - Continue with Mucinex, chest PT, pulmonary toilette - Follow on Urine legionella and S. pneumococcal antigen ,blood culture x2, sputum culture, plus Flu pcr - Nothing by mouth as SLP recommendation - Pulmonary consult greatly appreciated  Paroxysmal supraventricular tachycardia:  - Continue Cardizem  Anxiety - Continue  Lexapro  Hypokalemia: - Repleted  HLD:  -Continue with Pravastatin  Chronic diastolic congestive heart failure - 2-D echo on 06/01/12 showed EF 60-65% with grade 2 diastolic dysfunction. Patient is not on diuretics at home,  - CHF is compensated,  BNP is 17.0.  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  - Follow by Dr. Marin Olp and last seen was 01/23/15. Completed the third cycle of chemotherapy on 02/05/15.  OSA: -CPAP at bedtime  Hypokalemia - Repleted, recheck in a.m.  Code Status: Full code  Family Communication: None at bedside  Disposition Plan: Pending clinical progression   Procedures None   Consults   Pulmonary   Medications  Scheduled Meds: . aspirin  81 mg Oral Daily  . dextromethorphan-guaiFENesin  1 tablet Oral BID  . diltiazem  120 mg Oral Daily  . escitalopram  10 mg Oral Daily  . guaiFENesin  1,200 mg Oral BID  . piperacillin-tazobactam (ZOSYN)  IV  3.375 g Intravenous 3 times per day  . pravastatin  40 mg Oral QHS  . sodium chloride  3 mL Intravenous Q12H  . vancomycin  1,000 mg Intravenous Q8H   Continuous Infusions: . sodium chloride 100 mL/hr at 02/10/15 0657   PRN Meds:.acetaminophen **OR** acetaminophen, albuterol, alum & mag hydroxide-simeth, lidocaine-prilocaine, ondansetron **OR** ondansetron (ZOFRAN) IV  DVT Prophylaxis SCDs,  Lab Results  Component Value Date   PLT 79* 02/10/2015    Antibiotics    Anti-infectives    Start     Dose/Rate Route Frequency Ordered Stop   02/10/15 0000  azithromycin (ZITHROMAX) 500 mg in dextrose 5 % 250 mL IVPB  Status:  Discontinued  500 mg 250 mL/hr over 60 Minutes Intravenous Every 24 hours 02/09/15 1452 02/09/15 1513   02/10/15 0000  vancomycin (VANCOCIN) IVPB 1000 mg/200 mL premix  Status:  Discontinued     1,000 mg 200 mL/hr over 60 Minutes Intravenous Every 8 hours 02/09/15 1529 02/09/15 2010   02/09/15 2030  vancomycin (VANCOCIN) IVPB 1000 mg/200 mL premix     1,000 mg 200 mL/hr over 60 Minutes Intravenous Every 8 hours 02/09/15 2010     02/09/15 2030  piperacillin-tazobactam (ZOSYN) IVPB 3.375 g     3.375 g 12.5 mL/hr over 240 Minutes Intravenous 3 times per day 02/09/15 2015     02/09/15 1630   vancomycin (VANCOCIN) IVPB 1000 mg/200 mL premix  Status:  Discontinued     1,000 mg 200 mL/hr over 60 Minutes Intravenous  Once 02/09/15 1516 02/09/15 2010   02/09/15 1530  vancomycin (VANCOCIN) IVPB 1000 mg/200 mL premix     1,000 mg 200 mL/hr over 60 Minutes Intravenous  Once 02/09/15 1516 02/09/15 1653   02/09/15 1530  cefTAZidime (FORTAZ) 2 g in dextrose 5 % 50 mL IVPB  Status:  Discontinued     2 g 100 mL/hr over 30 Minutes Intravenous Every 8 hours 02/09/15 1524 02/09/15 1959   02/09/15 1520  cefTAZidime (FORTAZ) 1 G injection    Comments:  Collene Gobble   : cabinet override      02/09/15 1520 02/09/15 1651   02/09/15 1500  cefTAZidime (FORTAZ) 2 g in dextrose 5 % 50 mL IVPB  Status:  Discontinued     2 g 100 mL/hr over 30 Minutes Intravenous 3 times per day 02/09/15 1452 02/09/15 1513   02/09/15 1500  cefTAZidime (FORTAZ) 2 g in dextrose 5 % 50 mL IVPB  Status:  Discontinued     2 g 100 mL/hr over 30 Minutes Intravenous 3 times per day 02/09/15 1457 02/09/15 1524   02/09/15 1448  cefTRIAXone (ROCEPHIN) 1 G injection  Status:  Discontinued    Comments:  Collene Gobble   : cabinet override      02/09/15 1448 02/09/15 1528   02/09/15 1445  azithromycin (ZITHROMAX) 500 MG injection  Status:  Discontinued    Comments:  Collene Gobble   : cabinet override      02/09/15 1445 02/09/15 1528   02/09/15 1430  cefTRIAXone (ROCEPHIN) 1 g in dextrose 5 % 50 mL IVPB  Status:  Discontinued     1 g 100 mL/hr over 30 Minutes Intravenous  Once 02/09/15 1424 02/09/15 1513   02/09/15 1430  azithromycin (ZITHROMAX) 500 mg in dextrose 5 % 250 mL IVPB  Status:  Discontinued     500 mg 250 mL/hr over 60 Minutes Intravenous  Once 02/09/15 1424 02/09/15 1513          Objective:   Filed Vitals:   02/10/15 0536 02/10/15 1213 02/10/15 1252 02/10/15 1356  BP:   109/70   Pulse:  90 89 92  Temp:   98.6 F (37 C)   TempSrc:   Oral   Resp:  18 20   Height:      Weight: 116.393 kg (256 lb 9.6 oz)      SpO2:  93% 97% 86%    Wt Readings from Last 3 Encounters:  02/10/15 116.393 kg (256 lb 9.6 oz)  01/23/15 117.482 kg (259 lb)  01/02/15 123.832 kg (273 lb)     Intake/Output Summary (Last 24 hours) at 02/10/15 1640 Last data filed at 02/10/15 1300  Gross per 24 hour  Intake    965 ml  Output   1900 ml  Net   -935 ml     Physical Exam  Awake Alert, Oriented X 3,  [place].AT,PERRAL Supple Neck,No JVD, No cervical lymphadenopathy appriciated. Scattered rails  Symmetrical Chest wall movement,fair entry bilaterally , no use of accessory muscle  RRR,No Gallops,Rubs or new Murmurs, No Parasternal Heave +ve B.Sounds, Abd Soft, No tenderness, No organomegaly appriciated, No rebound - guarding or rigidity. No Cyanosis, Clubbing or edema, No new Rash or bruise    Data Review   Micro Results Recent Results (from the past 240 hour(s))  Culture, blood (routine x 2)     Status: None (Preliminary result)   Collection Time: 02/09/15 11:11 AM  Result Value Ref Range Status   Specimen Description BLOOD RIGHT FOREARM  Final   Special Requests BOTTLES DRAWN AEROBIC AND ANAEROBIC 5CC  Final   Culture   Final    NO GROWTH < 24 HOURS Performed at [facility]    Report Status PENDING  Incomplete  Culture, blood (routine x 2)     Status: None (Preliminary result)   Collection Time: 02/09/15 11:23 AM  Result Value Ref Range Status   Specimen Description BLOOD RIGHT HAND  Final   Special Requests BOTTLES DRAWN AEROBIC AND ANAEROBIC 4.5CC EACH  Final   Culture   Final    NO GROWTH < 24 HOURS Performed at [facility]    Report Status PENDING  Incomplete    Radiology Reports Dg Chest 2 View  02/09/2015   CLINICAL DATA:  Fever, weakness for 1 week  EXAM: CHEST  2 VIEW  COMPARISON:  02/09/2015  FINDINGS: Cardiomediastinal silhouette is stable. Persistent streaky infiltrate/ pneumonia in right lower lobe. No pulmonary edema. Bony thorax is unremarkable.  IMPRESSION: Persistent  streaky infiltrate/ pneumonia right lower lobe. No pulmonary edema.   Electronically Signed   By: Lahoma Crocker M.D.   On: 02/09/2015 13:06   Ct Angio Chest Pe W/cm &/or Wo Cm  02/09/2015   CLINICAL DATA:  Hypoxia. Nausea, weakness, shortness of breath since chemotherapy 2 weeks ago.  EXAM: CT ANGIOGRAPHY CHEST WITH CONTRAST  TECHNIQUE: Multidetector CT imaging of the chest was performed using the standard protocol during bolus administration of intravenous contrast. Multiplanar CT image reconstructions and MIPs were obtained to evaluate the vascular anatomy.  CONTRAST:  152mL OMNIPAQUE IOHEXOL 350 MG/ML SOLN  COMPARISON:  Chest x-ray earlier today.  FINDINGS: Heart is normal size. Aorta is normal caliber. No mediastinal, hilar, or axillary adenopathy. No filling defects in the pulmonary arteries to suggest pulmonary emboli. There are bilateral airspace opacities, most confluent in the lower lobes, right greater than left, but also present in the upper lobes with ground-glass opacities present. Findings most compatible with multifocal pneumonia. No pleural effusions.  Chest wall soft tissues are unremarkable. Imaging into the upper abdomen shows no acute findings.  No acute bony abnormality or focal bone lesion.  Review of the MIP images confirms the above findings.  IMPRESSION: No evidence of pulmonary embolus.  Bilateral airspace opacities, most confluent in the lower lobes but also noted in the upper lobes concerning for multifocal pneumonia.   Electronically Signed   By: Rolm Baptise M.D.   On: 02/09/2015 14:05   Dg Chest Port 1 View  02/09/2015   CLINICAL DATA:  Shortness of breath, nausea and weakness  EXAM: PORTABLE CHEST - 1 VIEW  COMPARISON:  None.  FINDINGS: The  heart size and mediastinal contours are within normal limits. Lungs are hypo aerated with crowding of the bronchovascular markings. Superimposed ill-defined right basilar airspace opacity is identified. The visualized skeletal structures are  unremarkable.  IMPRESSION: Low volume exam with diffuse prominence of the interstitial markings and more focal ill-defined right lower lobe airspace opacity which could indicate atelectasis although early pneumonia could appear similar. If symptoms persist, consider PA and lateral chest radiographs obtained at full inspiration when the patient is clinically able.   Electronically Signed   By: Conchita Paris M.D.   On: 02/09/2015 11:40     CBC  Recent Labs Lab 02/05/15 1337 02/09/15 1118 02/10/15 0630  WBC 8.6 5.3 4.6  HGB 13.2 12.0* 11.3*  HCT 37.6* 35.2* 32.5*  PLT 94* 90* 79*  MCV 92 92.6 90.5  MCH 32.4 31.6 31.5  MCHC 35.1 34.1 34.8  RDW 15.9* 16.7* 16.5*  LYMPHSABS 0.8* 0.8  --   MONOABS  --  0.2  --   EOSABS 0.0 0.0  --   BASOSABS 0.0 0.0  --     Chemistries   Recent Labs Lab 02/05/15 1339 02/09/15 1118 02/10/15 0630  NA 136 132* 130*  K 3.7 3.2* 3.2*  CL 101 95* 96*  CO2 25 25 25   GLUCOSE 83 89 98  BUN 20 10 <5*  CREATININE 0.8 0.60* 0.65  CALCIUM 8.8 8.5* 8.2*  MG  --   --  1.7  AST 21 16  --   ALT 45 41  --   ALKPHOS 37 38  --   BILITOT 0.80 1.2  --    ------------------------------------------------------------------------------------------------------------------ estimated creatinine clearance is 148.7 mL/min (by C-G formula based on Cr of 0.65). ------------------------------------------------------------------------------------------------------------------ No results for input(s): HGBA1C in the last 72 hours. ------------------------------------------------------------------------------------------------------------------  Recent Labs  02/10/15 0630  CHOL 189  HDL 41  LDLCALC 103*  TRIG 227*  CHOLHDL 4.6   ------------------------------------------------------------------------------------------------------------------ No results for input(s): TSH, T4TOTAL, T3FREE, THYROIDAB in the last 72 hours.  Invalid input(s):  FREET3 ------------------------------------------------------------------------------------------------------------------ No results for input(s): VITAMINB12, FOLATE, FERRITIN, TIBC, IRON, RETICCTPCT in the last 72 hours.  Coagulation profile  Recent Labs Lab 02/09/15 2127  INR 1.07    No results for input(s): DDIMER in the last 72 hours.  Cardiac Enzymes No results for input(s): CKMB, TROPONINI, MYOGLOBIN in the last 168 hours.  Invalid input(s): CK ------------------------------------------------------------------------------------------------------------------ Invalid input(s): POCBNP     Time Spent in minutes   30 minutes   Patriciaann Rabanal M.D on 02/10/2015 at 4:40 PM  Between 7am to 7pm - Pager - [phone]  After 7pm go to [email or web address] - password [facility]  [facility]   Office  [phone]

## 2015-02-10 NOTE — Evaluation (Signed)
Clinical/Bedside Swallow Evaluation Patient Details  Name: James Byrd MRN: [id number] Date of Birth: 08/08/1965  Today's Date: 02/10/2015 Time: SLP Start Time (ACUTE ONLY): 11 SLP Stop Time (ACUTE ONLY): 1434 SLP Time Calculation (min) (ACUTE ONLY): 25 min  Past Medical History:  Past Medical History  Diagnosis Date  . Paroxysmal supraventricular tachycardia 05/24/2012    Described in the treadmill report; details are pending   . Anxiety 05/24/2012  . Malignant melanoma of right great toe 11/14/2014  . HLD (hyperlipidemia)   . Chronic diastolic congestive heart failure    Past Surgical History:  Past Surgical History  Procedure Laterality Date  . Rotator cuff repair  2010    3 yrs ago  . Rotator cuff repair    . Ablation    . R toe partial ambutation     HPI:  James Byrd is a 49 y.o. male with PMH of SVT, hyperlipidemia, anxiety, right great toe melanoma (post status of amputation, on chemotherapy), diastolic congestive heart failure (EF 60-65% with grade 2 diastolic distortion), hyperlipidemia, thrombocytopenia, who presents with generalized weakness, fever, nausea, cough and shortness breath. CT Chest shows multifocal PNA.   Assessment / Plan / Recommendation Clinical Impression  Pt's respiratory status is not appropriate for initaition of PO diet at this time. He remains on NRB mask this afternoon, wtih SpO2 hovering around 90% at rest. With conversation (with mask in place) and brief removal of mask to take in boluses, SpO2 quickly drops as low as 81% with Min-Mod cues provided for pt to take deep breaths for recovery. Oropharyngeal swallow does appear functional, and would anticipate good prognosis for a return to PO diet. Recommend to remain NPO until respirations are improved - will reassess on next date.     Aspiration Risk  Moderate    Diet Recommendation NPO   Medication Administration: Via alternative means    Other  Recommendations Oral Care Recommendations:  Oral care QID   Follow Up Recommendations       Frequency and Duration min 3x week  1 week   Pertinent Vitals/Pain See clinical impressions    SLP Swallow Goals     Swallow Study Prior Functional Status  Type of Home: House Available Help at Discharge: Family;Available PRN/intermittently    General Other Pertinent Information: James Byrd is a 49 y.o. male with PMH of SVT, hyperlipidemia, anxiety, right great toe melanoma (post status of amputation, on chemotherapy), diastolic congestive heart failure (EF 60-65% with grade 2 diastolic distortion), hyperlipidemia, thrombocytopenia, who presents with generalized weakness, fever, nausea, cough and shortness breath. CT Chest shows multifocal PNA. Type of Study: Bedside swallow evaluation Previous Swallow Assessment: none in chart Diet Prior to this Study: NPO Temperature Spikes Noted: Yes (100.2) Respiratory Status: Supplemental O2 delivered via (comment) (NRB) History of Recent Intubation: No Behavior/Cognition: Alert;Cooperative;Pleasant mood Oral Cavity - Dentition: Adequate natural dentition/normal for age Self-Feeding Abilities: Able to feed self Patient Positioning: Upright in chair/Tumbleform Baseline Vocal Quality: Normal Volitional Cough: Strong Volitional Swallow: Able to elicit    Oral/Motor/Sensory Function Overall Oral Motor/Sensory Function: Appears within functional limits for tasks assessed   Ice Chips Ice chips: Not tested   Thin Liquid Thin Liquid: Impaired Presentation: Self Fed;Straw Pharyngeal  Phase Impairments: Change in Vital Signs    Nectar Thick Nectar Thick Liquid: Not tested   Honey Thick Honey Thick Liquid: Not tested   Puree Puree: Impaired Presentation: Self Fed;Spoon Pharyngeal Phase Impairments: Change in Vital Signs   Solid    Solid: Not  tested      Germain Osgood, M.A. CCC-SLP [phone]  Germain Osgood 02/10/2015,2:53 PM

## 2015-02-10 NOTE — Evaluation (Signed)
Physical Therapy Evaluation Patient Details Name: James Byrd MRN: [id number] DOB: 1965-07-11 Today's Date: 02/10/2015   History of Present Illness  49 yo male admitted with weakness nausea and SOB PMH CA melonoma  receiving chemothereapy.Dx with HCAP, paroxysmal, SVT, and hypokalemia.  Clinical Impression  Limited evaluation preformed today.  Assisted pt OOB to chair with supervision.  Limited by decrease in O2 sats with mobility and talking.  Pt on NRB.  Will progress mobility as pulmonary status continues to improve.  Because he was independent PTA, I anticipate that he will be able to return to his physical baseline as his pulmonary status improves.  With this limited evaluation it is hard to tell at this time.   PT to follow acutely for deficits listed below and to update d/c recommendations as needed.     Follow Up Recommendations No PT follow up;Other (comment) (tentatively)    Equipment Recommendations  Other (comment) (possibly home O2)    Recommendations for Other Services   NA    Precautions / Restrictions Precautions Precautions: Fall Precaution Comments: requires oxygen      Mobility  Bed Mobility Overal bed mobility: Modified Independent             General bed mobility comments: used bed rails, took a little extra time to get to sitting  Transfers Overall transfer level: Needs assistance Equipment used: None Transfers: Sit to/from [facility] Sit to Stand: Supervision Stand pivot transfers: Supervision       General transfer comment: supervision for safety due to lines, pt was able with assist of his arms for balance to transfer bed to chair.  Chair was lower and he had uncontrolled descent to sit.   Ambulation/Gait             General Gait Details: deferred ambulation due to decreased O2 sats and pt on NRB mask         Balance Overall balance assessment: Needs assistance Sitting-balance support: Feet supported;Bilateral  upper extremity supported Sitting balance-Leahy Scale: Good     Standing balance support: Single extremity supported Standing balance-Leahy Scale: Fair                               Pertinent Vitals/Pain Pain Assessment: No/denies pain    Home Living Family/patient expects to be discharged to:: Private residence Living Arrangements: Spouse/significant other (works) Available Help at Discharge: Family;Available PRN/intermittently Type of Home: House Home Access: Stairs to enter Entrance Stairs-Rails: None Entrance Stairs-Number of Steps: 2 Home Layout: One level Home Equipment: Crutches      Prior Function Level of Independence: Independent         Comments: drives, not currently working due to on leave for medical reasons     Hand Dominance   Dominant Hand: Right    Extremity/Trunk Assessment   Upper Extremity Assessment: Defer to OT evaluation           Lower Extremity Assessment: Generalized weakness      Cervical / Trunk Assessment: Normal  Communication   Communication: No difficulties  Cognition Arousal/Alertness: Awake/alert Behavior During Therapy: WFL for tasks assessed/performed Overall Cognitive Status: Within Functional Limits for tasks assessed (friend reports he is "really out of it" yesterday)                               Assessment/Plan    PT Assessment Patient  needs continued PT services  PT Diagnosis Difficulty walking;Abnormality of gait;Generalized weakness   PT Problem List Decreased strength;Decreased activity tolerance;Decreased balance;Decreased mobility;Decreased knowledge of use of DME;Cardiopulmonary status limiting activity  PT Treatment Interventions DME instruction;Gait training;Stair training;Functional mobility training;Therapeutic activities;Therapeutic exercise;Balance training;Neuromuscular re-education;Patient/family education   PT Goals (Current goals can be found in the Care Plan section)  Acute Rehab PT Goals Patient Stated Goal: to return to his "normal" PT Goal Formulation: With patient Time For Goal Achievement: 02/24/15 Potential to Achieve Goals: Good    Frequency Min 3X/week    End of Session Equipment Utilized During Treatment: Oxygen (NRB mask kept on entire session) Activity Tolerance: Treatment limited secondary to medical complications (Comment) (limited by decreased O2 sats) Patient left: in chair;with call bell/phone within reach;with family/visitor present Nurse Communication: Mobility status         Time: [id number] PT Time Calculation (min) (ACUTE ONLY): 16 min   Charges:   PT Evaluation $Initial PT Evaluation Tier I: 1 Procedure          Tylasia Fletchall B. [place], [place], DPT [phone]   02/10/2015, 3:41 PM

## 2015-02-10 NOTE — Clinical Documentation Improvement (Signed)
Internal Medicine  Can the diagnosis of Respiratory Failure be further specified?   Document Acuity - Acute, Chronic, Acute on Chronic  Other  Clinically Undetermined  Document any associated diagnoses/conditions.   Supporting Information: ED note: Initial exam notable for hypoxia, with room air 75% saturation. With nonrebreather mask, the patient increases to 95% oxygen saturation I-STAT ARTERIAL BLOOD GAS, ED - Abnormal; Notable for the following:    pH, Arterial 7.482 (*)    Bicarbonate 27.0 (*)    Acid-Base Excess 4.0 (*)        remains hypoxic, requiring supplement oxygen, but now is saturating in the low 90s with 6 L nasal cannula. new oxygen requirement H&P: "shortness breath. oxygen saturation 82 on room air, tachypnea,  Please exercise your independent, professional judgment when responding. A specific answer is not anticipated or expected.   Thank You,  Philippa Chester, RN, BSN, [place]

## 2015-02-10 NOTE — Care Management Note (Signed)
Case Management Note  Patient Details  Name: Samit Sisti MRN: [id number] Date of Birth: 1966/04/14  Subjective/Objective:                 Patient from home with wife. Patient denies any barriers to medications, preforming ADLs, or transportation to MD. Patient is currently on chemo for melanoma admitted with HCAP on non re breather mask at this time. Patient enjoying visiting and talking with pastor during assessment.   Action/Plan:  Will continue to follow and offer resources and [facility] as needed.  Expected Discharge Date:                  Expected Discharge Plan:  [place]  In-House Referral:     Discharge planning Services  CM Consult  Post Acute Care Choice:    Choice offered to:  Patient  DME Arranged:    DME Agency:     HH Arranged:    [place] Agency:     Status of Service:  In process, will continue to follow  Medicare Important Message Given:    Date Medicare IM Given:    Medicare IM give by:    Date Additional Medicare IM Given:    Additional Medicare Important Message give by:     If discussed at [place] of Stay Meetings, dates discussed:    Additional Comments:  Carles Collet, RN 02/10/2015, 12:01 PM

## 2015-02-11 ENCOUNTER — Inpatient Hospital Stay (HOSPITAL_COMMUNITY): Payer: BLUE CROSS/BLUE SHIELD

## 2015-02-11 DIAGNOSIS — I5032 Chronic diastolic (congestive) heart failure: Secondary | ICD-10-CM

## 2015-02-11 DIAGNOSIS — J9601 Acute respiratory failure with hypoxia: Secondary | ICD-10-CM | POA: Insufficient documentation

## 2015-02-11 DIAGNOSIS — E785 Hyperlipidemia, unspecified: Secondary | ICD-10-CM

## 2015-02-11 DIAGNOSIS — J9600 Acute respiratory failure, unspecified whether with hypoxia or hypercapnia: Secondary | ICD-10-CM

## 2015-02-11 DIAGNOSIS — R1310 Dysphagia, unspecified: Secondary | ICD-10-CM

## 2015-02-11 LAB — BASIC METABOLIC PANEL
Anion gap: 13 (ref 5–15)
BUN: 7 mg/dL (ref 6–20)
CHLORIDE: 92 mmol/L — AB (ref 101–111)
CO2: 25 mmol/L (ref 22–32)
Calcium: 8.6 mg/dL — ABNORMAL LOW (ref 8.9–10.3)
Creatinine, Ser: 0.89 mg/dL (ref 0.61–1.24)
GFR calc non Af Amer: >60 mL/min (ref >60)
Glucose, Bld: 91 mg/dL (ref 65–99)
POTASSIUM: 3.4 mmol/L — AB (ref 3.5–5.1)
SODIUM: 130 mmol/L — AB (ref 135–145)

## 2015-02-11 LAB — LEGIONELLA ANTIGEN, URINE

## 2015-02-11 LAB — CBC
HEMATOCRIT: 33 % — AB (ref 39.0–52.0)
HEMOGLOBIN: 11.7 g/dL — AB (ref 13.0–17.0)
MCH: 31.5 pg (ref 26.0–34.0)
MCHC: 35.5 g/dL (ref 30.0–36.0)
MCV: 88.9 fL (ref 78.0–100.0)
Platelets: 97 10*3/uL — ABNORMAL LOW (ref 150–400)
RBC: 3.71 MIL/uL — AB (ref 4.22–5.81)
RDW: 16.1 % — ABNORMAL HIGH (ref 11.5–15.5)
WBC: 4.7 10*3/uL (ref 4.0–10.5)

## 2015-02-11 LAB — VANCOMYCIN, TROUGH: Vancomycin Tr: 15 ug/mL (ref 10.0–20.0)

## 2015-02-11 LAB — GLUCOSE, CAPILLARY: GLUCOSE-CAPILLARY: 79 mg/dL (ref 65–99)

## 2015-02-11 LAB — BRAIN NATRIURETIC PEPTIDE: B NATRIURETIC PEPTIDE 5: 9.3 pg/mL (ref 0.0–100.0)

## 2015-02-11 MED ORDER — DEXTROSE 5 % IV SOLN
500.0000 mg | INTRAVENOUS | Status: DC
  Administered 2015-02-11 – 2015-02-12 (×2): 500 mg via INTRAVENOUS
  Filled 2015-02-11 (×2): qty 500

## 2015-02-11 MED ORDER — POTASSIUM CHLORIDE CRYS ER 20 MEQ PO TBCR
40.0000 meq | EXTENDED_RELEASE_TABLET | Freq: Once | ORAL | Status: AC
  Administered 2015-02-11: 40 meq via ORAL
  Filled 2015-02-11: qty 2

## 2015-02-11 NOTE — Progress Notes (Signed)
PCCM PROGRESS NOTE  ADMISSION DATE: 02/09/2015 CONSULT DATE: 02/10/2015 REFERRING PROVIDER: [facility]  CC: Short of breath  SUBJECTIVE: Feels hungry.  Winded with activity.  Denies chest pain.  Sleepy.  OBJECTIVE: Temp:  [98.6 F (37 C)-99.2 F (37.3 C)] 98.8 F (37.1 C) (08/24 0621) Pulse Rate:  [87-139] 93 (08/24 0621) Resp:  [16-20] 20 (08/24 0621) BP: (104-109)/(66-76) 104/66 mmHg (08/24 0621) SpO2:  [86 %-97 %] 94 % (08/24 0621) FiO2 (%):  [70 %-100 %] 100 % (08/24 0859)   General: wearing high flow oxygen HEENT: no sinus tenderness Cardiac: regular Chest: b/l crackles more at bases Abd: soft, non tender Ext: no edema Neuro: normal strength Skin: no rashes   CMP Latest Ref Rng 02/11/2015 02/10/2015 02/09/2015  Glucose 65 - 99 mg/dL 91 98 89  BUN 6 - 20 mg/dL 7 <5(L) 10  Creatinine 0.61 - 1.24 mg/dL 0.89 0.65 0.60(L)  Sodium 135 - 145 mmol/L 130(L) 130(L) 132(L)  Potassium 3.5 - 5.1 mmol/L 3.4(L) 3.2(L) 3.2(L)  Chloride 101 - 111 mmol/L 92(L) 96(L) 95(L)  CO2 22 - 32 mmol/L 25 25 25   Calcium 8.9 - 10.3 mg/dL 8.6(L) 8.2(L) 8.5(L)  Total Protein 6.5 - 8.1 g/dL - - 6.1(L)  Albumin 3.3 - 5.5 g/dL - - -  Total Bilirubin 0.3 - 1.2 mg/dL - - 1.2  Alkaline Phos 38 - 126 U/L - - 38  AST 15 - 41 U/L - - 16  ALT 17 - 63 U/L - - 41     CBC Latest Ref Rng 02/11/2015 02/10/2015 02/09/2015  WBC 4.0 - 10.5 K/uL 4.7 4.6 5.3  Hemoglobin 13.0 - 17.0 g/dL 11.7(L) 11.3(L) 12.0(L)  Hematocrit 39.0 - 52.0 % 33.0(L) 32.5(L) 35.2(L)  Platelets 150 - 400 K/uL 97(L) 79(L) 90(L)    Lab Results  Component Value Date   ESRSEDRATE 82* 02/10/2015     Dg Chest 2 View  02/09/2015   CLINICAL DATA:  Fever, weakness for 1 week  EXAM: CHEST  2 VIEW  COMPARISON:  02/09/2015  FINDINGS: Cardiomediastinal silhouette is stable. Persistent streaky infiltrate/ pneumonia in right lower lobe. No pulmonary edema. Bony thorax is unremarkable.  IMPRESSION: Persistent streaky infiltrate/ pneumonia right lower  lobe. No pulmonary edema.   Electronically Signed   By: Lahoma Crocker M.D.   On: 02/09/2015 13:06   Ct Angio Chest Pe W/cm &/or Wo Cm  02/09/2015   CLINICAL DATA:  Hypoxia. Nausea, weakness, shortness of breath since chemotherapy 2 weeks ago.  EXAM: CT ANGIOGRAPHY CHEST WITH CONTRAST  TECHNIQUE: Multidetector CT imaging of the chest was performed using the standard protocol during bolus administration of intravenous contrast. Multiplanar CT image reconstructions and MIPs were obtained to evaluate the vascular anatomy.  CONTRAST:  11mL OMNIPAQUE IOHEXOL 350 MG/ML SOLN  COMPARISON:  Chest x-ray earlier today.  FINDINGS: Heart is normal size. Aorta is normal caliber. No mediastinal, hilar, or axillary adenopathy. No filling defects in the pulmonary arteries to suggest pulmonary emboli. There are bilateral airspace opacities, most confluent in the lower lobes, right greater than left, but also present in the upper lobes with ground-glass opacities present. Findings most compatible with multifocal pneumonia. No pleural effusions.  Chest wall soft tissues are unremarkable. Imaging into the upper abdomen shows no acute findings.  No acute bony abnormality or focal bone lesion.  Review of the MIP images confirms the above findings.  IMPRESSION: No evidence of pulmonary embolus.  Bilateral airspace opacities, most confluent in the lower lobes but also noted in  the upper lobes concerning for multifocal pneumonia.   Electronically Signed   By: Rolm Baptise M.D.   On: 02/09/2015 14:05   Dg Chest Port 1 View  02/11/2015   CLINICAL DATA:  Acute respiratory failure with hypoxia, shortness of breath, CHF.  EXAM: PORTABLE CHEST - 1 VIEW  COMPARISON:  Chest x-ray and chest CT scan of February 09, 2015.  FINDINGS: The lungs are mildly hypoinflated. Persistent airspace opacities are present bilaterally greatest on the right in the upper lobe. The left hemidiaphragm is less well demonstrated today. The cardiac silhouette is enlarged.  The pulmonary vascularity is indistinct. There is small left pleural effusion. There is chronic widening of the right AC joint.  IMPRESSION: Bilateral pneumonia which has increased in conspicuity since yesterday's study. There is a small left pleural effusion.   Electronically Signed   By: David  Martinique M.D.   On: 02/11/2015 07:19   Dg Chest Port 1 View  02/09/2015   CLINICAL DATA:  Shortness of breath, nausea and weakness  EXAM: PORTABLE CHEST - 1 VIEW  COMPARISON:  None.  FINDINGS: The heart size and mediastinal contours are within normal limits. Lungs are hypo aerated with crowding of the bronchovascular markings. Superimposed ill-defined right basilar airspace opacity is identified. The visualized skeletal structures are unremarkable.  IMPRESSION: Low volume exam with diffuse prominence of the interstitial markings and more focal ill-defined right lower lobe airspace opacity which could indicate atelectasis although early pneumonia could appear similar. If symptoms persist, consider PA and lateral chest radiographs obtained at full inspiration when the patient is clinically able.   Electronically Signed   By: Conchita Paris M.D.   On: 02/09/2015 11:40     CULTURES: 8/22 Blood >> 8/22 Legionella Ag >> 8/22 Pneumococcal Ag >> negative 8/23 Influenza PCR >> negative  STUDIES: 8/22 CT chest >> b/l ASD Rt > Lt 8/24 Echo >>  EVENTS: 8/22 Admit 8/24 Oncology consulted  DISCUSSION: 49 yo male presented with weakness, fever, nausea, cough, dyspnea, hypoxia from b/l pneumonia.  He has hx of Stage IIIB [id number]) melanoma of Rt toe s/p chemotherapy with YERVOY >> last on 01/26/15.  ASSESSMENT/PLAN:  Acute hypoxic respiratory failure 2nd to Sepsis from HCAP (?viral) with small Lt pleural effusion in setting of melanoma s/p chemotherapy.  Less likely pulmonary infiltrates are related to chemotherapy induced pneumonitis. Plan: - oxygen to keep SpO2 > 92% - using high flow oxygen - f/u CXR 8/25 -  day 3 of Abx, currently on vancomycin, zosyn - defer steroids for now >> if not improvement over next 24 to 48 hrs, then consider adding solumedrol  Dysphagia related to respiratory distress. Plan: - NPO - f/u with speech therapy >> hopefully he will be able to resume diet 8/24  Updated pts wife at bedside.  Chesley Mires, MD [facility] Pulmonary/Critical Care 02/11/2015, 9:33 AM Pager:  [phone] After 3pm call: [phone]

## 2015-02-11 NOTE — Progress Notes (Signed)
Spoke with Larene Beach from speech therapy and requested for speech therapy to follow up with patient today per MD Ghimire.

## 2015-02-11 NOTE — Progress Notes (Signed)
Echocardiogram 2D Echocardiogram has been performed.  Tresa Res 02/11/2015, 2:26 PM

## 2015-02-11 NOTE — Progress Notes (Addendum)
PATIENT DETAILS Name: James Byrd Age: 49 y.o. Sex: male Date of Birth: Nov 13, 1965 Admit Date: 02/09/2015 Admitting Physician Ivor Costa, MD [id number], Herbie Baltimore, MD  Subjective: Still hypoxic-requiring high flow oxygen.  Assessment/Plan: Principal Problem: Acute hypoxic respiratory failure: Secondary to presumed HCAP. Continue antibiotics, oxygen as needed.  Active Problems: HCAP: Continue empiric vancomycin and Zosyn, will add Zithromax for atypical coverage. On Ipilimumab-for metastatic melanoma-which apparently can cause pneumonitis. For now would continue with antibiotics, and add steroids if no improvement. PCCM following  Dysphagia: Suspect more from respiratory distress, await SLP evaluation.  History of paroxysmal SVT: Continue Cardizem.  History of anxiety: Continue Lexapro  History of dyslipidemia: Continue statin  History of chronic diastolic heart failure: Clinically compensated. Follow volume status closely.  Hypokalemia: Replete and recheck  Thrombocytopenia: Seems to be a chronic issue-likely secondary to chemotherapy. Follow.  History of metastatic melanoma: Spoke with primary oncologist Dr. Chales Salmon that resp failure is from  Ipilimumab-he will see formally in a.m.  Disposition: Remain inpatient  Antimicrobial agents  See below  Anti-infectives    Start     Dose/Rate Route Frequency Ordered Stop   02/10/15 0000  azithromycin (ZITHROMAX) 500 mg in dextrose 5 % 250 mL IVPB  Status:  Discontinued     500 mg 250 mL/hr over 60 Minutes Intravenous Every 24 hours 02/09/15 1452 02/09/15 1513   02/10/15 0000  vancomycin (VANCOCIN) IVPB 1000 mg/200 mL premix  Status:  Discontinued     1,000 mg 200 mL/hr over 60 Minutes Intravenous Every 8 hours 02/09/15 1529 02/09/15 2010   02/09/15 2030  vancomycin (VANCOCIN) IVPB 1000 mg/200 mL premix     1,000 mg 200 mL/hr over 60 Minutes Intravenous Every 8 hours 02/09/15 2010     02/09/15 2030   piperacillin-tazobactam (ZOSYN) IVPB 3.375 g     3.375 g 12.5 mL/hr over 240 Minutes Intravenous 3 times per day 02/09/15 2015     02/09/15 1630  vancomycin (VANCOCIN) IVPB 1000 mg/200 mL premix  Status:  Discontinued     1,000 mg 200 mL/hr over 60 Minutes Intravenous  Once 02/09/15 1516 02/09/15 2010   02/09/15 1530  vancomycin (VANCOCIN) IVPB 1000 mg/200 mL premix     1,000 mg 200 mL/hr over 60 Minutes Intravenous  Once 02/09/15 1516 02/09/15 1653   02/09/15 1530  cefTAZidime (FORTAZ) 2 g in dextrose 5 % 50 mL IVPB  Status:  Discontinued     2 g 100 mL/hr over 30 Minutes Intravenous Every 8 hours 02/09/15 1524 02/09/15 1959   02/09/15 1520  cefTAZidime (FORTAZ) 1 G injection    Comments:  Collene Gobble   : cabinet override      02/09/15 1520 02/09/15 1651   02/09/15 1500  cefTAZidime (FORTAZ) 2 g in dextrose 5 % 50 mL IVPB  Status:  Discontinued     2 g 100 mL/hr over 30 Minutes Intravenous 3 times per day 02/09/15 1452 02/09/15 1513   02/09/15 1500  cefTAZidime (FORTAZ) 2 g in dextrose 5 % 50 mL IVPB  Status:  Discontinued     2 g 100 mL/hr over 30 Minutes Intravenous 3 times per day 02/09/15 1457 02/09/15 1524   02/09/15 1448  cefTRIAXone (ROCEPHIN) 1 G injection  Status:  Discontinued    Comments:  Collene Gobble   : cabinet override      02/09/15 1448 02/09/15 1528   02/09/15 1445  azithromycin (ZITHROMAX) 500  MG injection  Status:  Discontinued    Comments:  Collene Gobble   : cabinet override      02/09/15 1445 02/09/15 1528   02/09/15 1430  cefTRIAXone (ROCEPHIN) 1 g in dextrose 5 % 50 mL IVPB  Status:  Discontinued     1 g 100 mL/hr over 30 Minutes Intravenous  Once 02/09/15 1424 02/09/15 1513   02/09/15 1430  azithromycin (ZITHROMAX) 500 mg in dextrose 5 % 250 mL IVPB  Status:  Discontinued     500 mg 250 mL/hr over 60 Minutes Intravenous  Once 02/09/15 1424 02/09/15 1513      DVT Prophylaxis: SCD's-Has thrombocytopenia  Code Status:  DNR  Family  Communication Spouse at bedside  Procedures: None  CONSULTS:  pulmonary/intensive care and hematology/oncology  Time spent 30 minutes-Greater than 50% of this time was spent in counseling, explanation of diagnosis, planning of further management, and coordination of care.  MEDICATIONS: Scheduled Meds: . aspirin  81 mg Oral Daily  . diltiazem  120 mg Oral Daily  . escitalopram  10 mg Oral Daily  . guaiFENesin  1,200 mg Oral BID  . piperacillin-tazobactam (ZOSYN)  IV  3.375 g Intravenous 3 times per day  . pravastatin  40 mg Oral QHS  . sodium chloride  3 mL Intravenous Q12H  . vancomycin  1,000 mg Intravenous Q8H   Continuous Infusions: . sodium chloride 10 mL/hr (02/10/15 1531)   PRN Meds:.acetaminophen **OR** acetaminophen, albuterol, alum & mag hydroxide-simeth, lidocaine-prilocaine, ondansetron **OR** ondansetron (ZOFRAN) IV    PHYSICAL EXAM: Vital signs in last 24 hours: Filed Vitals:   02/11/15 0946 02/11/15 1055 02/11/15 1223 02/11/15 1400  BP: 107/75     Pulse:      Temp:  98.5 F (36.9 C) 99.4 F (37.4 C)   TempSrc:  Oral Rectal   Resp:      Height:      Weight:      SpO2:    90%    Weight change:  Filed Weights   02/09/15 2100 02/10/15 0536  Weight: 116.121 kg (256 lb) 116.393 kg (256 lb 9.6 oz)   Body mass index is 34.79 kg/(m^2).   Gen Exam: Awake and alert with clear speech.   Neck: Supple, No JVD.   Chest: Bibasilar rales CVS: S1 S2 Regular, no murmurs.  Abdomen: soft, BS +, non tender, non distended.  Extremities: no edema, lower extremities warm to touch Neurologic: Non Focal.   Skin: No Rash.   Wounds: N/A.    Intake/Output from previous day:  Intake/Output Summary (Last 24 hours) at 02/11/15 1416 Last data filed at 02/11/15 1100  Gross per 24 hour  Intake 1448.33 ml  Output   1570 ml  Net -121.67 ml     LAB RESULTS: CBC  Recent Labs Lab 02/05/15 1337 02/09/15 1118 02/10/15 0630 02/11/15 0518  WBC 8.6 5.3 4.6 4.7  HGB  13.2 12.0* 11.3* 11.7*  HCT 37.6* 35.2* 32.5* 33.0*  PLT 94* 90* 79* 97*  MCV 92 92.6 90.5 88.9  MCH 32.4 31.6 31.5 31.5  MCHC 35.1 34.1 34.8 35.5  RDW 15.9* 16.7* 16.5* 16.1*  LYMPHSABS 0.8* 0.8  --   --   MONOABS  --  0.2  --   --   EOSABS 0.0 0.0  --   --   BASOSABS 0.0 0.0  --   --     Chemistries   Recent Labs Lab 02/05/15 1339 02/09/15 1118 02/10/15 0630 02/11/15 0518  NA 136  132* 130* 130*  K 3.7 3.2* 3.2* 3.4*  CL 101 95* 96* 92*  CO2 25 25 25 25   GLUCOSE 83 89 98 91  BUN 20 10 <5* 7  CREATININE 0.8 0.60* 0.65 0.89  CALCIUM 8.8 8.5* 8.2* 8.6*  MG  --   --  1.7  --     CBG:  Recent Labs Lab 02/09/15 1146 02/10/15 0814 02/11/15 0823  GLUCAP 83 84 79    GFR Estimated Creatinine Clearance: 133.7 mL/min (by C-G formula based on Cr of 0.89).  Coagulation profile  Recent Labs Lab 02/09/15 2127  INR 1.07    Cardiac Enzymes No results for input(s): CKMB, TROPONINI, MYOGLOBIN in the last 168 hours.  Invalid input(s): CK  Invalid input(s): POCBNP No results for input(s): DDIMER in the last 72 hours. No results for input(s): HGBA1C in the last 72 hours.  Recent Labs  02/10/15 0630  CHOL 189  HDL 41  LDLCALC 103*  TRIG 227*  CHOLHDL 4.6   No results for input(s): TSH, T4TOTAL, T3FREE, THYROIDAB in the last 72 hours.  Invalid input(s): FREET3 No results for input(s): VITAMINB12, FOLATE, FERRITIN, TIBC, IRON, RETICCTPCT in the last 72 hours. No results for input(s): LIPASE, AMYLASE in the last 72 hours.  Urine Studies No results for input(s): UHGB, CRYS in the last 72 hours.  Invalid input(s): UACOL, UAPR, USPG, UPH, UTP, UGL, UKET, UBIL, UNIT, UROB, ULEU, UEPI, UWBC, URBC, UBAC, CAST, UCOM, BILUA  MICROBIOLOGY: Recent Results (from the past 240 hour(s))  Culture, blood (routine x 2)     Status: None (Preliminary result)   Collection Time: 02/09/15 11:11 AM  Result Value Ref Range Status   Specimen Description BLOOD RIGHT FOREARM  Final    Special Requests BOTTLES DRAWN AEROBIC AND ANAEROBIC 5CC  Final   Culture   Final    NO GROWTH < 24 HOURS Performed at [facility]    Report Status PENDING  Incomplete  Culture, blood (routine x 2)     Status: None (Preliminary result)   Collection Time: 02/09/15 11:23 AM  Result Value Ref Range Status   Specimen Description BLOOD RIGHT HAND  Final   Special Requests BOTTLES DRAWN AEROBIC AND ANAEROBIC 4.5CC EACH  Final   Culture   Final    NO GROWTH < 24 HOURS Performed at [facility]    Report Status PENDING  Incomplete    RADIOLOGY STUDIES/RESULTS: Dg Chest 2 View  02/09/2015   CLINICAL DATA:  Fever, weakness for 1 week  EXAM: CHEST  2 VIEW  COMPARISON:  02/09/2015  FINDINGS: Cardiomediastinal silhouette is stable. Persistent streaky infiltrate/ pneumonia in right lower lobe. No pulmonary edema. Bony thorax is unremarkable.  IMPRESSION: Persistent streaky infiltrate/ pneumonia right lower lobe. No pulmonary edema.   Electronically Signed   By: Lahoma Crocker M.D.   On: 02/09/2015 13:06   Ct Angio Chest Pe W/cm &/or Wo Cm  02/09/2015   CLINICAL DATA:  Hypoxia. Nausea, weakness, shortness of breath since chemotherapy 2 weeks ago.  EXAM: CT ANGIOGRAPHY CHEST WITH CONTRAST  TECHNIQUE: Multidetector CT imaging of the chest was performed using the standard protocol during bolus administration of intravenous contrast. Multiplanar CT image reconstructions and MIPs were obtained to evaluate the vascular anatomy.  CONTRAST:  112mL OMNIPAQUE IOHEXOL 350 MG/ML SOLN  COMPARISON:  Chest x-ray earlier today.  FINDINGS: Heart is normal size. Aorta is normal caliber. No mediastinal, hilar, or axillary adenopathy. No filling defects in the pulmonary arteries to  suggest pulmonary emboli. There are bilateral airspace opacities, most confluent in the lower lobes, right greater than left, but also present in the upper lobes with ground-glass opacities present. Findings most compatible with  multifocal pneumonia. No pleural effusions.  Chest wall soft tissues are unremarkable. Imaging into the upper abdomen shows no acute findings.  No acute bony abnormality or focal bone lesion.  Review of the MIP images confirms the above findings.  IMPRESSION: No evidence of pulmonary embolus.  Bilateral airspace opacities, most confluent in the lower lobes but also noted in the upper lobes concerning for multifocal pneumonia.   Electronically Signed   By: Rolm Baptise M.D.   On: 02/09/2015 14:05   Dg Chest Port 1 View  02/11/2015   CLINICAL DATA:  Acute respiratory failure with hypoxia, shortness of breath, CHF.  EXAM: PORTABLE CHEST - 1 VIEW  COMPARISON:  Chest x-ray and chest CT scan of February 09, 2015.  FINDINGS: The lungs are mildly hypoinflated. Persistent airspace opacities are present bilaterally greatest on the right in the upper lobe. The left hemidiaphragm is less well demonstrated today. The cardiac silhouette is enlarged. The pulmonary vascularity is indistinct. There is small left pleural effusion. There is chronic widening of the right AC joint.  IMPRESSION: Bilateral pneumonia which has increased in conspicuity since yesterday's study. There is a small left pleural effusion.   Electronically Signed   By: David  Martinique M.D.   On: 02/11/2015 07:19   Dg Chest Port 1 View  02/09/2015   CLINICAL DATA:  Shortness of breath, nausea and weakness  EXAM: PORTABLE CHEST - 1 VIEW  COMPARISON:  None.  FINDINGS: The heart size and mediastinal contours are within normal limits. Lungs are hypo aerated with crowding of the bronchovascular markings. Superimposed ill-defined right basilar airspace opacity is identified. The visualized skeletal structures are unremarkable.  IMPRESSION: Low volume exam with diffuse prominence of the interstitial markings and more focal ill-defined right lower lobe airspace opacity which could indicate atelectasis although early pneumonia could appear similar. If symptoms persist,  consider PA and lateral chest radiographs obtained at full inspiration when the patient is clinically able.   Electronically Signed   By: Conchita Paris M.D.   On: 02/09/2015 11:40    Oren Binet, MD  [facility] Pager:[phone]  If 7PM-7AM, please contact night-coverage [email or web address] Password [email or web address] 02/11/2015, 2:16 PM   LOS: 2 days

## 2015-02-11 NOTE — Progress Notes (Signed)
ANTIBIOTIC CONSULT NOTE - FOLLOW UP  Pharmacy Consult for Vancomycin Indication: rule out pneumonia  No Known Allergies  Patient Measurements: Height: 6' (182.9 cm) Weight: 256 lb 9.6 oz (116.393 kg) IBW/kg (Calculated) : 77.6  Vital Signs: Temp: 99.4 F (37.4 C) (08/24 1223) Temp Source: Rectal (08/24 1223) BP: 107/75 mmHg (08/24 0946) Pulse Rate: 93 (08/24 0621) Intake/Output from previous day: 08/23 0701 - 08/24 0700 In: 1448.3 [P.O.:60; I.V.:888.3; IV Piggyback:500] Out: 1270 [Urine:1270] Intake/Output from this shift: Total I/O In: 0  Out: 600 [Urine:600]  Labs:  Recent Labs  02/09/15 1118 02/10/15 0630 02/11/15 0518  WBC 5.3 4.6 4.7  HGB 12.0* 11.3* 11.7*  PLT 90* 79* 97*  CREATININE 0.60* 0.65 0.89   Estimated Creatinine Clearance: 133.7 mL/min (by C-G formula based on Cr of 0.89).  Recent Labs  02/11/15 1200  [place] [facility]     Microbiology: Recent Results (from the past 720 hour(s))  Culture, blood (routine x 2)     Status: None (Preliminary result)   Collection Time: 02/09/15 11:11 AM  Result Value Ref Range Status   Specimen Description BLOOD RIGHT FOREARM  Final   Special Requests BOTTLES DRAWN AEROBIC AND ANAEROBIC 5CC  Final   Culture   Final    NO GROWTH < 24 HOURS Performed at [facility]    Report Status PENDING  Incomplete  Culture, blood (routine x 2)     Status: None (Preliminary result)   Collection Time: 02/09/15 11:23 AM  Result Value Ref Range Status   Specimen Description BLOOD RIGHT HAND  Final   Special Requests BOTTLES DRAWN AEROBIC AND ANAEROBIC 4.5CC EACH  Final   Culture   Final    NO GROWTH < 24 HOURS Performed at [facility]    Report Status PENDING  Incomplete    Anti-infectives    Start     Dose/Rate Route Frequency Ordered Stop   02/10/15 0000  azithromycin (ZITHROMAX) 500 mg in dextrose 5 % 250 mL IVPB  Status:  Discontinued     500 mg 250 mL/hr over 60 Minutes Intravenous Every 24 hours  02/09/15 1452 02/09/15 1513   02/10/15 0000  vancomycin (VANCOCIN) IVPB 1000 mg/200 mL premix  Status:  Discontinued     1,000 mg 200 mL/hr over 60 Minutes Intravenous Every 8 hours 02/09/15 1529 02/09/15 2010   02/09/15 2030  vancomycin (VANCOCIN) IVPB 1000 mg/200 mL premix     1,000 mg 200 mL/hr over 60 Minutes Intravenous Every 8 hours 02/09/15 2010     02/09/15 2030  piperacillin-tazobactam (ZOSYN) IVPB 3.375 g     3.375 g 12.5 mL/hr over 240 Minutes Intravenous 3 times per day 02/09/15 2015     02/09/15 1630  vancomycin (VANCOCIN) IVPB 1000 mg/200 mL premix  Status:  Discontinued     1,000 mg 200 mL/hr over 60 Minutes Intravenous  Once 02/09/15 1516 02/09/15 2010   02/09/15 1530  vancomycin (VANCOCIN) IVPB 1000 mg/200 mL premix     1,000 mg 200 mL/hr over 60 Minutes Intravenous  Once 02/09/15 1516 02/09/15 1653   02/09/15 1530  cefTAZidime (FORTAZ) 2 g in dextrose 5 % 50 mL IVPB  Status:  Discontinued     2 g 100 mL/hr over 30 Minutes Intravenous Every 8 hours 02/09/15 1524 02/09/15 1959   02/09/15 1520  cefTAZidime (FORTAZ) 1 G injection    Comments:  Collene Gobble   : cabinet override      02/09/15 1520 02/09/15 1651  02/09/15 1500  cefTAZidime (FORTAZ) 2 g in dextrose 5 % 50 mL IVPB  Status:  Discontinued     2 g 100 mL/hr over 30 Minutes Intravenous 3 times per day 02/09/15 1452 02/09/15 1513   02/09/15 1500  cefTAZidime (FORTAZ) 2 g in dextrose 5 % 50 mL IVPB  Status:  Discontinued     2 g 100 mL/hr over 30 Minutes Intravenous 3 times per day 02/09/15 1457 02/09/15 1524   02/09/15 1448  cefTRIAXone (ROCEPHIN) 1 G injection  Status:  Discontinued    Comments:  Collene Gobble   : cabinet override      02/09/15 1448 02/09/15 1528   02/09/15 1445  azithromycin (ZITHROMAX) 500 MG injection  Status:  Discontinued    Comments:  Collene Gobble   : cabinet override      02/09/15 1445 02/09/15 1528   02/09/15 1430  cefTRIAXone (ROCEPHIN) 1 g in dextrose 5 % 50 mL IVPB  Status:   Discontinued     1 g 100 mL/hr over 30 Minutes Intravenous  Once 02/09/15 1424 02/09/15 1513   02/09/15 1430  azithromycin (ZITHROMAX) 500 mg in dextrose 5 % 250 mL IVPB  Status:  Discontinued     500 mg 250 mL/hr over 60 Minutes Intravenous  Once 02/09/15 1424 02/09/15 1513      Assessment: 49 yo M on Vancomycin and Zosyn for empiric HCAP coverage.  Pt is immunocompromised with ongoing chemotherapy for melanoma.    Vancomycin level = 15 (goal 15-20)  SCr stable  0.89  Goal of Therapy:  Vancomycin trough level 15-20 mcg/ml  Plan:  Continue Vancomycin 1gm IV q8h Continue Zosyn 3.375 gm IV q8h (4 hour infusion).  [employer], Pharm.D., BCPS Clinical Pharmacist Pager [phone] 02/11/2015 1:55 PM

## 2015-02-11 NOTE — Progress Notes (Signed)
Patient unable to wear CPAP tonight due to being on HFNC and requiring high amounts of oxygen. Patient is on HFNC 25 L and 100%. Will continue to monitor patient.

## 2015-02-11 NOTE — Consult Note (Signed)
[place]  [phone]   Patient Care Team: Myer Peer, MD as PCP - General (Family Medicine)  HOSPITAL CONSULT  NOTE  HPI: 49 year old man with a history of Stage IIIB ([id number]) subungual melanoma of the right hallux on Yervoy, admitted on 8/22 with  2 day history of generalized weakness, fever, nausea, shortness of breath and cough. Patient is somnolent.Denies vision changes, or mucositis. Denies any chest pain or palpitations. Denies lower extremity swelling. Appetite is decreased. Denies any dysuria. Denies abnormal skin rashes, or neuropathy. Denies any bleeding issues such as epistaxis, hematemesis, hematuria or hematochezia. He was desaturated, requiring CCM involvement.CT angio chest on 8/22 was remarkable for Bilateral airspace opacities, most confluent in the lower lobes but also noted in the upper lobes concerning for multifocal pneumonia. No PE was noted. Cultures were obtained, negative to date. His symptoms are now better controlled but still remains short of breath with exertion. We were informed of the patient's admission  Principle Diagnosis:  Stage IIIB [id number]) subungual melanoma of the right hallux  Current Therapy:  YERVOY q 21 days s/p cycle 3    MEDICATIONS: Scheduled Meds: . aspirin  81 mg Oral Daily  . diltiazem  120 mg Oral Daily  . escitalopram  10 mg Oral Daily  . guaiFENesin  1,200 mg Oral BID  . piperacillin-tazobactam (ZOSYN)  IV  3.375 g Intravenous 3 times per day  . pravastatin  40 mg Oral QHS  . sodium chloride  3 mL Intravenous Q12H  . vancomycin  1,000 mg Intravenous Q8H   Continuous Infusions: . sodium chloride 10 mL/hr (02/10/15 1531)   PRN Meds:.acetaminophen **OR** acetaminophen, albuterol, alum & mag hydroxide-simeth, lidocaine-prilocaine, ondansetron **OR** ondansetron (ZOFRAN) IV   ALLERGIES:  No Known Allergies   PHYSICAL EXAMINATION:  Filed Vitals:   02/11/15 0621  BP: 104/66  Pulse:  93  Temp: 98.8 F (37.1 C)  Resp: 20   Filed Weights   02/09/15 2100 02/10/15 0536  Weight: 256 lb (116.121 kg) 256 lb 9.6 oz (116.393 kg)    GENERAL:alert, no distress and comfortable SKIN: skin color, texture, turgor are normal, no rashes or significant lesions EYES: normal, conjunctiva are pink and non-injected, sclera clear OROPHARYNX:no exudate, no erythema and lips, buccal mucosa, and tongue normal  NECK: supple, thyroid normal size, non-tender, without nodularity LYMPH:  no palpable lymphadenopathy in the cervical, axillary or inguinal LUNGS: bibasilar rales noted HEART: regular rate & rhythm and no murmurs and no lower extremity edema ABDOMEN: soft, non-tender and normal bowel sounds Musculoskeletal:no cyanosis of digits and no clubbing. S/p right great toe amputation PSYCH: alert & oriented x 3 with fluent speech NEURO: no focal motor/sensory deficits   LABORATORY/RADIOLOGY DATA:   Recent Labs Lab 02/05/15 1337 02/09/15 1118 02/10/15 0630 02/11/15 0518  WBC 8.6 5.3 4.6 4.7  HGB 13.2 12.0* 11.3* 11.7*  HCT 37.6* 35.2* 32.5* 33.0*  PLT 94* 90* 79* 97*  MCV 92 92.6 90.5 88.9  MCH 32.4 31.6 31.5 31.5  MCHC 35.1 34.1 34.8 35.5  RDW 15.9* 16.7* 16.5* 16.1*  LYMPHSABS 0.8* 0.8  --   --   MONOABS  --  0.2  --   --   EOSABS 0.0 0.0  --   --   BASOSABS 0.0 0.0  --   --     CMP    Recent Labs Lab 02/05/15 1339 02/09/15 1118 02/10/15 0630 02/11/15 0518  NA 136 132* 130* 130*  K 3.7 3.2*  3.2* 3.4*  CL 101 95* 96* 92*  CO2 25 25 25 25   GLUCOSE 83 89 98 91  BUN 20 10 <5* 7  CREATININE 0.8 0.60* 0.65 0.89  CALCIUM 8.8 8.5* 8.2* 8.6*  MG  --   --  1.7  --   AST 21 16  --   --   ALT 45 41  --   --   ALKPHOS 37 38  --   --   BILITOT 0.80 1.2  --   --         Component Value Date/Time   BILITOT 1.2 02/09/2015 1118   BILITOT 0.80 02/05/2015 1339   BILITOT 0.59 12/17/2014 1328        Component Value Date/Time   ESRSEDRATE 82* 02/10/2015 1610      Recent Labs Lab 02/09/15 2127  INR 1.07      Urinalysis    Component Value Date/Time   COLORURINE YELLOW 02/09/2015 1220   APPEARANCEUR CLEAR 02/09/2015 1220   LABSPEC 1.015 02/09/2015 1220   PHURINE 6.0 02/09/2015 1220   GLUCOSEU NEGATIVE 02/09/2015 1220   HGBUR NEGATIVE 02/09/2015 1220   BILIRUBINUR NEGATIVE 02/09/2015 1220   KETONESUR NEGATIVE 02/09/2015 1220   PROTEINUR NEGATIVE 02/09/2015 1220   UROBILINOGEN 1.0 02/09/2015 1220   NITRITE NEGATIVE 02/09/2015 1220   LEUKOCYTESUR NEGATIVE 02/09/2015 1220    Drugs of Abuse     Component Value Date/Time   LABOPIA NONE DETECTED 02/09/2015 2128   COCAINSCRNUR NONE DETECTED 02/09/2015 2128   LABBENZ NONE DETECTED 02/09/2015 2128   AMPHETMU NONE DETECTED 02/09/2015 2128   THCU NONE DETECTED 02/09/2015 2128   LABBARB NONE DETECTED 02/09/2015 2128     Liver Function Tests:  Recent Labs Lab 02/05/15 1339 02/09/15 1118  AST 21 16  ALT 45 41  ALKPHOS 37 38  BILITOT 0.80 1.2  PROT 5.9* 6.1*  ALBUMIN  --  3.2*   CBG:  Recent Labs Lab 02/09/15 1146 02/10/15 [place] 83 84    Radiology Studies:  Dg Chest 2 View  02/09/2015   CLINICAL DATA:  Fever, weakness for 1 week  EXAM: CHEST  2 VIEW  COMPARISON:  02/09/2015  FINDINGS: Cardiomediastinal silhouette is stable. Persistent streaky infiltrate/ pneumonia in right lower lobe. No pulmonary edema. Bony thorax is unremarkable.  IMPRESSION: Persistent streaky infiltrate/ pneumonia right lower lobe. No pulmonary edema.   Electronically Signed   By: Lahoma Crocker M.D.   On: 02/09/2015 13:06   Ct Angio Chest Pe W/cm &/or Wo Cm  02/09/2015   CLINICAL DATA:  Hypoxia. Nausea, weakness, shortness of breath since chemotherapy 2 weeks ago.  EXAM: CT ANGIOGRAPHY CHEST WITH CONTRAST  TECHNIQUE: Multidetector CT imaging of the chest was performed using the standard protocol during bolus administration of intravenous contrast. Multiplanar CT image reconstructions and MIPs were  obtained to evaluate the vascular anatomy.  CONTRAST:  139mL OMNIPAQUE IOHEXOL 350 MG/ML SOLN  COMPARISON:  Chest x-ray earlier today.  FINDINGS: Heart is normal size. Aorta is normal caliber. No mediastinal, hilar, or axillary adenopathy. No filling defects in the pulmonary arteries to suggest pulmonary emboli. There are bilateral airspace opacities, most confluent in the lower lobes, right greater than left, but also present in the upper lobes with ground-glass opacities present. Findings most compatible with multifocal pneumonia. No pleural effusions.  Chest wall soft tissues are unremarkable. Imaging into the upper abdomen shows no acute findings.  No acute bony abnormality or focal bone lesion.  Review of the MIP  images confirms the above findings.  IMPRESSION: No evidence of pulmonary embolus.  Bilateral airspace opacities, most confluent in the lower lobes but also noted in the upper lobes concerning for multifocal pneumonia.   Electronically Signed   By: Rolm Baptise M.D.   On: 02/09/2015 14:05   Dg Chest Port 1 View  02/11/2015   CLINICAL DATA:  Acute respiratory failure with hypoxia, shortness of breath, CHF.  EXAM: PORTABLE CHEST - 1 VIEW  COMPARISON:  Chest x-ray and chest CT scan of February 09, 2015.  FINDINGS: The lungs are mildly hypoinflated. Persistent airspace opacities are present bilaterally greatest on the right in the upper lobe. The left hemidiaphragm is less well demonstrated today. The cardiac silhouette is enlarged. The pulmonary vascularity is indistinct. There is small left pleural effusion. There is chronic widening of the right AC joint.  IMPRESSION: Bilateral pneumonia which has increased in conspicuity since yesterday's study. There is a small left pleural effusion.   Electronically Signed   By: David  Martinique M.D.   On: 02/11/2015 07:19   Dg Chest Port 1 View  02/09/2015   CLINICAL DATA:  Shortness of breath, nausea and weakness  EXAM: PORTABLE CHEST - 1 VIEW  COMPARISON:  None.   FINDINGS: The heart size and mediastinal contours are within normal limits. Lungs are hypo aerated with crowding of the bronchovascular markings. Superimposed ill-defined right basilar airspace opacity is identified. The visualized skeletal structures are unremarkable.  IMPRESSION: Low volume exam with diffuse prominence of the interstitial markings and more focal ill-defined right lower lobe airspace opacity which could indicate atelectasis although early pneumonia could appear similar. If symptoms persist, consider PA and lateral chest radiographs obtained at full inspiration when the patient is clinically able.   Electronically Signed   By: Conchita Paris M.D.   On: 02/09/2015 11:40       ASSESSMENT AND PLAN:  Stage IIIB [id number]) subungual melanoma of the right hallux  on Yervoy, s/p cycle 3 Will keep his appointment for cycle 4 for now, and will reschedule if patient remains in hospital.  Acute hypoxic respiratory failure Likely due to CAP Continue current antibiotics and supportive therapy  OSA On CPAP at bedtime  Anemia in neoplastic disease Due to recent chemotherapy and acute on chronic illness No bleeding issues are reported No transfusion is indicated at this time Monitor counts closely Transfuse blood to maintain a Hb of 8 g or if the patient is acutely bleeding  Thrombocytopenia This is due to malignancy, dilution, infection, antibiotics and recent chemo No bleeding issues are noted Monitor counts closely No transfusion is indicated at this time  Malnutrition Consider Nutrition evaluation  DVT prophylaxis On SCDs  Deconditioning Appreciate OT/PT follow up   Full Code   Other medical issues as per admitting team   [facility] E, PA-C 02/11/2015, 7:56 AM  ADDENDUM:  I saw and examined the patient this morning. He had a chest x-ray done.  I do worry about this being a pneumonitis from [facility]. This happens in less than 5% of patients. However, he has had  unusual reactions so far.  As such, I think that it would be a good idea to get him on high-dose Solu-Medrol. I don't see that this would be dangerous for him.  His cultures have been negative to date.   I would probably try to narrow down his antibiotics a little bit.  I do not think that he should get his last cycle of Yervoy. I just have  sense that this pneumonia/pneumonitis is another autoimmune type process that he is prone to.  I would follow his chest x-ray daily. I do not think that he needs a bronchoscopy right now.  I do not think that he needs infliximab. Sometimes, we use this in addition to steroids for patients who have severe autoimmune reactions.  I will  Be out of town through the weekend. I will make sure one of the partners I work with  Will see him.  I very much appreciate the outstanding care that he is getting from everybody up on [address].  Lum Keas  Proverbs 8:17   I

## 2015-02-11 NOTE — Progress Notes (Signed)
Speech Language Pathology Treatment: Dysphagia  Patient Details Name: James Byrd MRN: [id number] DOB: [birth year] Today's Date: 02/11/2015 Time: 1430-1450 SLP Time Calculation (min) (ACUTE ONLY): 20 min  Assessment / Plan / Recommendation Clinical Impression  F/u after yesterday's initial swallow assessment.  Pt today is on high flow nasal cannula, 25 liter flow and 100% FiO2.  Sitting in recliner having just finished OT session.  Sp02 maintaining >92 % throughout reassessment.  Pt appears to be eating/drinking safely -able to consume sips of water, boluses of puree with no overt s/s of aspiration, no difficulty coordinating breathing with swallowing cycles.  Discussed aerodigestive tract and its shared functions; the importance of prioritizing breathing over eating; taking frequent rest breaks, small sips/bites, and generally consuming POs cautiously, allowing time for breathing.  Pt is mildly confused, but he and his wife express understanding.  Recommend initiating a full liquid diet for now per pt's preferences; advance as he wishes.  No further SLP intervention is needed - will sign off.    HPI Other Pertinent Information: James Byrd is a 49 y.o. male with PMH of SVT, hyperlipidemia, anxiety, right great toe melanoma (post status of amputation, on chemotherapy), diastolic congestive heart failure (EF 60-65% with grade 2 diastolic distortion), hyperlipidemia, thrombocytopenia, who presents with generalized weakness, fever, nausea, cough and shortness breath. CT Chest shows multifocal PNA.   Pertinent Vitals Pain Assessment: No/denies pain  SLP Plan  All goals met    Recommendations Diet recommendations:  (full liquid diet) Liquids provided via: Straw Medication Administration: Whole meds with liquid Supervision: Staff to assist with self feeding Compensations: Slow rate;Small sips/bites (take rest breaks) Postural Changes and/or Swallow Maneuvers: Seated upright 90 degrees             Oral Care Recommendations: Oral care BID Follow up Recommendations: None Plan: All goals met   Kendrick Haapala L. Tivis Ringer, [place] CCC/SLP Pager [phone]      Juan Quam Laurice 02/11/2015, 3:00 PM

## 2015-02-11 NOTE — Progress Notes (Signed)
Occupational Therapy Treatment Patient Details Name: James Byrd MRN: [id number] DOB: 04-10-1966 Today's Date: 02/11/2015    History of present illness 49 yo male admitted with weakness nausea and SOB PMH CA melonoma  receiving chemothereapy.Dx with HCAP, paroxysmal, SVT, and hypokalemia.   OT comments  Pt still with limited endurance as well as decreased selective attention and decreased initiation.  Oxygen sats remained 92% or greater during treatment on 25% HFNC.  Min guard assist for sit to stand and standing with pt only tolerating being up on his feet for 1.5 mins during LB selfcare.  Will continue to follow for tx to increase endurance, independence and initiation.    Follow Up Recommendations  Home health OT    Equipment Recommendations  Tub/shower seat       Precautions / Restrictions Precautions Precautions: Fall Precaution Comments: oxygen dependent Restrictions Weight Bearing Restrictions: No       Mobility Bed Mobility                  Transfers Overall transfer level: Needs assistance   Transfers: Sit to/from Stand Sit to Stand: Min guard Stand pivot transfers: Min guard       General transfer comment: Pt with increased swaying with static standing.  Only able to tolerate standing for 1.5 mins before needing to sit and rest.      Balance Overall balance assessment: Needs assistance   Sitting balance-Leahy Scale: Good       Standing balance-Leahy Scale: Fair                     ADL Overall ADL's : Needs assistance/impaired             Lower Body Bathing: Maximal assistance;Sit to/from stand           Toilet Transfer: Programmer, multimedia Details (indicate cue type and reason): stand pivot Toileting- Clothing Manipulation and Hygiene: Moderate assistance;Sit to/from stand         General ADL Comments: Pt very slow to initiate to command.  Upon therapist entering room his wife was assisting  with his bath.  He was not initiating any assistance for bath until wife and therapist had him stand to wash peri area.  Pt min assist level for sit to stand and standing balance.  He demonstrates increased swaying and also closed his eyes.  He completed peri anal bathing but wife assisted with pulling under shorts over his hips.  Gave pt gripper socks and instructed him to put on.  Pt did not attempt donning after several cues and over 5 mins of time.  Educated pt's spouse on energy conservation for selfcare tasks and provided handout as well.  SLP in room to take over session with pt sittting in the bedside chair.                Cognition   Behavior During Therapy: Flat affect Overall Cognitive Status: Impaired/Different from baseline (Pt with delayed initiation of all tasks this session.)                                    Pertinent Vitals/ Pain       Pain Assessment: No/denies pain         Frequency Min 2X/week     Progress Toward Goals  OT Goals(current goals can now be found in the care plan section)  Progress towards  OT goals: Progressing toward goals     Plan Discharge plan remains appropriate       End of Session Equipment Utilized During Treatment: Oxygen   Activity Tolerance Patient limited by fatigue;Patient limited by lethargy   Patient Left in chair;with call bell/phone within reach;with family/visitor present;Other (comment) (with SLP to perform swallowing tx)           Time: [id number] OT Time Calculation (min): 32 min  Charges: OT General Charges $OT Visit: 1 Procedure OT Treatments $Self Care/Home Management : 23-37 mins  Suprina Mandeville OTR/L 02/11/2015, 2:51 PM

## 2015-02-12 ENCOUNTER — Inpatient Hospital Stay (HOSPITAL_COMMUNITY): Payer: BLUE CROSS/BLUE SHIELD

## 2015-02-12 DIAGNOSIS — D630 Anemia in neoplastic disease: Secondary | ICD-10-CM

## 2015-02-12 DIAGNOSIS — E46 Unspecified protein-calorie malnutrition: Secondary | ICD-10-CM

## 2015-02-12 DIAGNOSIS — D6481 Anemia due to antineoplastic chemotherapy: Secondary | ICD-10-CM

## 2015-02-12 DIAGNOSIS — D638 Anemia in other chronic diseases classified elsewhere: Secondary | ICD-10-CM

## 2015-02-12 LAB — EXPECTORATED SPUTUM ASSESSMENT W REFEX TO RESP CULTURE

## 2015-02-12 LAB — PROCALCITONIN: Procalcitonin: 0.24 ng/mL

## 2015-02-12 LAB — HIV ANTIBODY (ROUTINE TESTING W REFLEX): HIV Screen 4th Generation wRfx: NONREACTIVE

## 2015-02-12 LAB — CBC
HEMATOCRIT: 33.8 % — AB (ref 39.0–52.0)
HEMOGLOBIN: 11.9 g/dL — AB (ref 13.0–17.0)
MCH: 31.5 pg (ref 26.0–34.0)
MCHC: 35.2 g/dL (ref 30.0–36.0)
MCV: 89.4 fL (ref 78.0–100.0)
Platelets: 116 10*3/uL — ABNORMAL LOW (ref 150–400)
RBC: 3.78 MIL/uL — AB (ref 4.22–5.81)
RDW: 15.9 % — ABNORMAL HIGH (ref 11.5–15.5)
WBC: 4.3 10*3/uL (ref 4.0–10.5)

## 2015-02-12 LAB — BASIC METABOLIC PANEL
ANION GAP: 9 (ref 5–15)
BUN: 7 mg/dL (ref 6–20)
CHLORIDE: 95 mmol/L — AB (ref 101–111)
CO2: 28 mmol/L (ref 22–32)
Calcium: 8.8 mg/dL — ABNORMAL LOW (ref 8.9–10.3)
Creatinine, Ser: 1.14 mg/dL (ref 0.61–1.24)
GFR calc Af Amer: >60 mL/min (ref >60)
GLUCOSE: 100 mg/dL — AB (ref 65–99)
POTASSIUM: 3.6 mmol/L (ref 3.5–5.1)
Sodium: 132 mmol/L — ABNORMAL LOW (ref 135–145)

## 2015-02-12 LAB — GLUCOSE, CAPILLARY
Glucose-Capillary: 92 mg/dL (ref 65–99)
Glucose-Capillary: 160 mg/dL — ABNORMAL HIGH (ref 65–99)

## 2015-02-12 LAB — EXPECTORATED SPUTUM ASSESSMENT W GRAM STAIN, RFLX TO RESP C

## 2015-02-12 MED ORDER — INSULIN ASPART 100 UNIT/ML ~~LOC~~ SOLN
0.0000 [IU] | Freq: Three times a day (TID) | SUBCUTANEOUS | Status: DC
  Administered 2015-02-12 – 2015-02-13 (×2): 2 [IU] via SUBCUTANEOUS
  Administered 2015-02-13: 5 [IU] via SUBCUTANEOUS
  Administered 2015-02-13: 2 [IU] via SUBCUTANEOUS
  Administered 2015-02-14: 5 [IU] via SUBCUTANEOUS
  Administered 2015-02-14 (×2): 2 [IU] via SUBCUTANEOUS
  Administered 2015-02-15: 5 [IU] via SUBCUTANEOUS
  Administered 2015-02-16 (×2): 2 [IU] via SUBCUTANEOUS

## 2015-02-12 MED ORDER — METHYLPREDNISOLONE SODIUM SUCC 1000 MG IJ SOLR
200.0000 mg | Freq: Every day | INTRAMUSCULAR | Status: AC
  Administered 2015-02-12 – 2015-02-15 (×4): 200 mg via INTRAVENOUS
  Filled 2015-02-12 (×4): qty 1.6

## 2015-02-12 NOTE — Progress Notes (Signed)
PCCM PROGRESS NOTE  ADMISSION DATE: 02/09/2015 CONSULT DATE: 02/10/2015 REFERRING PROVIDER: [facility]  CC: Short of breath  SUBJECTIVE: Not as much cough.  Still winded with activity.  OBJECTIVE: Temp:  [98.3 F (36.8 C)-99.4 F (37.4 C)] 98.3 F (36.8 C) (08/25 0629) Pulse Rate:  [94-105] 105 (08/25 0629) Resp:  [18-20] 18 (08/25 0211) BP: (93-110)/(63-70) 110/70 mmHg (08/25 0629) SpO2:  [90 %-95 %] 93 % (08/25 0755) FiO2 (%):  [100 %] 100 % (08/25 0755) Weight:  [243 lb (110.224 kg)] 243 lb (110.224 kg) (08/24 1900)   General: wearing high flow oxygen HEENT: no sinus tenderness Cardiac: regular Chest: better air movement, faint basilar crackles Rt > Lt, no wheeze Abd: soft, non tender Ext: no edema Neuro: normal strength Skin: no rashes   CMP Latest Ref Rng 02/12/2015 02/11/2015 02/10/2015  Glucose 65 - 99 mg/dL 100(H) 91 98  BUN 6 - 20 mg/dL 7 7 <5(L)  Creatinine 0.61 - 1.24 mg/dL 1.14 0.89 0.65  Sodium 135 - 145 mmol/L 132(L) 130(L) 130(L)  Potassium 3.5 - 5.1 mmol/L 3.6 3.4(L) 3.2(L)  Chloride 101 - 111 mmol/L 95(L) 92(L) 96(L)  CO2 22 - 32 mmol/L 28 25 25   Calcium 8.9 - 10.3 mg/dL 8.8(L) 8.6(L) 8.2(L)  Total Protein 6.5 - 8.1 g/dL - - -  Albumin 3.3 - 5.5 g/dL - - -  Total Bilirubin 0.3 - 1.2 mg/dL - - -  Alkaline Phos 38 - 126 U/L - - -  AST 15 - 41 U/L - - -  ALT 17 - 63 U/L - - -    CBC Latest Ref Rng 02/12/2015 02/11/2015 02/10/2015  WBC 4.0 - 10.5 K/uL 4.3 4.7 4.6  Hemoglobin 13.0 - 17.0 g/dL 11.9(L) 11.7(L) 11.3(L)  Hematocrit 39.0 - 52.0 % 33.8(L) 33.0(L) 32.5(L)  Platelets 150 - 400 K/uL 116(L) 97(L) 79(L)    Lab Results  Component Value Date   ESRSEDRATE 82* 02/10/2015    BNP    Component Value Date/Time   BNP 9.3 02/11/2015 0830    Dg Chest Port 1 View  02/12/2015   CLINICAL DATA:  Health care associated pneumonia, CHF, acute respiratory failure with hypoxia.  EXAM: PORTABLE CHEST - 1 VIEW  COMPARISON:  Portable chest x-ray of February 11, 2015   FINDINGS: The lungs are mildly hypoinflated. The interstitial markings have improved and the confluent alveolar opacities bilaterally are less conspicuous. The left hemidiaphragm is better demonstrated. There is no significant pleural effusion. The cardiac silhouette is normal in size. The bony thorax exhibits no acute abnormality.  IMPRESSION: Further interval improvement in bilateral pneumonia. The small left pleural effusion is less conspicuous today. When the patient can tolerate the procedure, a PA and lateral chest x-ray would be useful.   Electronically Signed   By: David  Martinique M.D.   On: 02/12/2015 07:28   Dg Chest Port 1 View  02/11/2015   CLINICAL DATA:  Acute respiratory failure with hypoxia, shortness of breath, CHF.  EXAM: PORTABLE CHEST - 1 VIEW  COMPARISON:  Chest x-ray and chest CT scan of February 09, 2015.  FINDINGS: The lungs are mildly hypoinflated. Persistent airspace opacities are present bilaterally greatest on the right in the upper lobe. The left hemidiaphragm is less well demonstrated today. The cardiac silhouette is enlarged. The pulmonary vascularity is indistinct. There is small left pleural effusion. There is chronic widening of the right AC joint.  IMPRESSION: Bilateral pneumonia which has increased in conspicuity since yesterday's study. There is a small left  pleural effusion.   Electronically Signed   By: David  Martinique M.D.   On: 02/11/2015 07:19     CULTURES: 8/22 Blood >> 8/22 Legionella Ag >> negative 8/22 Pneumococcal Ag >> negative 8/23 Influenza PCR >> negative  STUDIES: 8/22 CT chest >> b/l ASD Rt > Lt 8/24 Echo >> mod LVH, EF 65 to [id number], grade 1 diastolic dysfx  EVENTS: [id number] Admit 8/24 Oncology consulted  DISCUSSION: 49 yo male presented with weakness, fever, nausea, cough, dyspnea, hypoxia from b/l pneumonia.  He has hx of Stage IIIB [id number]) melanoma of Rt toe s/p chemotherapy with Yervoy >> last on 01/26/15.  CXR shows some improvement on  8/25.  ASSESSMENT/PLAN:  Acute hypoxic respiratory failure 2nd to Sepsis from HCAP (?viral) with small Lt pleural effusion in setting of melanoma s/p chemotherapy.  Less likely pulmonary infiltrates are related to chemotherapy induced pneumonitis. Plan: - oxygen to keep SpO2 90 to 95% - using high flow oxygen - f/u CXR intermittently - day 4 of Abx, currently on vancomycin, zosyn, zithromax - solumedrol ordered 8/25 by oncology  Dysphagia related to respiratory distress. Plan: - full liquid diet - f/u with speech therapy   Chesley Mires, MD [place] 02/12/2015, 9:46 AM Pager:  [phone] After 3pm call: [phone]

## 2015-02-12 NOTE — Progress Notes (Signed)
Physical Therapy Treatment Patient Details Name: James Byrd MRN: [id number] DOB: Jun 03, 1966 Today's Date: 02-16-15    History of Present Illness 49 yo male admitted with weakness nausea and SOB PMH CA melonoma  receiving chemothereapy.Dx with HCAP, paroxysmal, SVT, and hypokalemia.    PT Comments    Pt making steady progress.  Follow Up Recommendations  No PT follow up;Other (comment) (tentatively)     Equipment Recommendations  Other (comment) ( home O2)    Recommendations for Other Services       Precautions / Restrictions Precautions Precautions: Fall Precaution Comments: on high flow nasal cannula    Mobility  Bed Mobility                  Transfers Overall transfer level: Needs assistance Equipment used: None Transfers: Sit to/from Stand Sit to Stand: Supervision         General transfer comment: for safety and lines  Ambulation/Gait Ambulation/Gait assistance: Supervision Ambulation Distance (Feet): 20 Feet Assistive device: None Gait Pattern/deviations: Step-through pattern;Decreased stride length   Gait velocity interpretation: Below normal speed for age/gender General Gait Details: Pt amb back and forth several feet several times due to short lines. No loss of balance and SaO2 98% after amb. Pt on high flow nasal cannula   Stairs            Wheelchair Mobility    Modified Rankin (Stroke Patients Only)       Balance   Sitting-balance support: No upper extremity supported;Feet supported Sitting balance-Leahy Scale: Good     Standing balance support: No upper extremity supported Standing balance-Leahy Scale: Good                      Cognition Arousal/Alertness: Awake/alert Behavior During Therapy: WFL for tasks assessed/performed Overall Cognitive Status: Within Functional Limits for tasks assessed                      Exercises      General Comments        Pertinent Vitals/Pain Pain  Assessment: No/denies pain    Home Living                      Prior Function            PT Goals (current goals can now be found in the care plan section) Acute Rehab PT Goals Patient Stated Goal: to return to his "normal" PT Goal Formulation: With patient Time For Goal Achievement: 02/24/15 Potential to Achieve Goals: Good Progress towards PT goals: Progressing toward goals    Frequency  Min 3X/week    PT Plan Current plan remains appropriate    Co-evaluation             End of Session Equipment Utilized During Treatment: Oxygen (pt on high flow nasal cannula) Activity Tolerance: Patient tolerated treatment well (limited by length of high flow nasal cannula lines) Patient left: in chair;with call bell/phone within reach;with family/visitor present     Time: [id number] PT Time Calculation (min) (ACUTE ONLY): 13 min  Charges:  $Gait Training: 8-22 mins                    G Codes:      Beckham Capistran 2015/02/16, 5:43 PM  [employer] PT [phone]

## 2015-02-12 NOTE — Progress Notes (Addendum)
PATIENT DETAILS Name: James Byrd Age: 49 y.o. Sex: male Date of Birth: Jan 17, 1966 Admit Date: 02/09/2015 Admitting Physician Ivor Costa, MD [id number], Herbie Baltimore, MD  Subjective: Still hypoxic-requiring high flow oxygen.Also gets SOB with minimal activity  Assessment/Plan: Principal Problem: Acute hypoxic respiratory failure: Secondary to presumed HCAP. Continue antibiotics, oxygen as needed.  Active Problems: HCAP: Continue empiric vancomycin, Zosyn and Zithromax.On Ipilimumab-for metastatic melanoma-which apparently can cause pneumonitis.Seen by Oncology-started on IV Solumedrol for 4 doses-starting 8/25. Influenza PCR neg, HIV pending. Follow clincal course. PCCM following. Will check CBG/place on SSI while on high dose steroids  Dysphagia: Suspect more from respiratory distress, SLP evaluation completed-on full liquids.  History of paroxysmal SVT: Continue Cardizem.  History of anxiety: Continue Lexapro  History of dyslipidemia: Continue statin  History of chronic diastolic heart failure: Clinically compensated. Follow volume status closely.  Hypokalemia: Replete and recheck  Thrombocytopenia: Seems to be a chronic issue-likely secondary to chemotherapy. Follow.  History of metastatic melanoma: Primary oncologist Dr. Syliva Overman  Disposition: Remain inpatient  Antimicrobial agents  See below  Anti-infectives    Start     Dose/Rate Route Frequency Ordered Stop   02/11/15 1800  azithromycin (ZITHROMAX) 500 mg in dextrose 5 % 250 mL IVPB     500 mg 250 mL/hr over 60 Minutes Intravenous Every 24 hours 02/11/15 1745     02/10/15 0000  azithromycin (ZITHROMAX) 500 mg in dextrose 5 % 250 mL IVPB  Status:  Discontinued     500 mg 250 mL/hr over 60 Minutes Intravenous Every 24 hours 02/09/15 1452 02/09/15 1513   02/10/15 0000  vancomycin (VANCOCIN) IVPB 1000 mg/200 mL premix  Status:  Discontinued     1,000 mg 200 mL/hr over 60 Minutes Intravenous  Every 8 hours 02/09/15 1529 02/09/15 2010   02/09/15 2030  vancomycin (VANCOCIN) IVPB 1000 mg/200 mL premix     1,000 mg 200 mL/hr over 60 Minutes Intravenous Every 8 hours 02/09/15 2010     02/09/15 2030  piperacillin-tazobactam (ZOSYN) IVPB 3.375 g     3.375 g 12.5 mL/hr over 240 Minutes Intravenous 3 times per day 02/09/15 2015     02/09/15 1630  vancomycin (VANCOCIN) IVPB 1000 mg/200 mL premix  Status:  Discontinued     1,000 mg 200 mL/hr over 60 Minutes Intravenous  Once 02/09/15 1516 02/09/15 2010   02/09/15 1530  vancomycin (VANCOCIN) IVPB 1000 mg/200 mL premix     1,000 mg 200 mL/hr over 60 Minutes Intravenous  Once 02/09/15 1516 02/09/15 1653   02/09/15 1530  cefTAZidime (FORTAZ) 2 g in dextrose 5 % 50 mL IVPB  Status:  Discontinued     2 g 100 mL/hr over 30 Minutes Intravenous Every 8 hours 02/09/15 1524 02/09/15 1959   02/09/15 1520  cefTAZidime (FORTAZ) 1 G injection    Comments:  Collene Gobble   : cabinet override      02/09/15 1520 02/09/15 1651   02/09/15 1500  cefTAZidime (FORTAZ) 2 g in dextrose 5 % 50 mL IVPB  Status:  Discontinued     2 g 100 mL/hr over 30 Minutes Intravenous 3 times per day 02/09/15 1452 02/09/15 1513   02/09/15 1500  cefTAZidime (FORTAZ) 2 g in dextrose 5 % 50 mL IVPB  Status:  Discontinued     2 g 100 mL/hr over 30 Minutes Intravenous 3 times per day 02/09/15 1457 02/09/15 1524   02/09/15 1448  cefTRIAXone (  ROCEPHIN) 1 G injection  Status:  Discontinued    Comments:  Collene Gobble   : cabinet override      02/09/15 1448 02/09/15 1528   02/09/15 1445  azithromycin (ZITHROMAX) 500 MG injection  Status:  Discontinued    Comments:  Collene Gobble   : cabinet override      02/09/15 1445 02/09/15 1528   02/09/15 1430  cefTRIAXone (ROCEPHIN) 1 g in dextrose 5 % 50 mL IVPB  Status:  Discontinued     1 g 100 mL/hr over 30 Minutes Intravenous  Once 02/09/15 1424 02/09/15 1513   02/09/15 1430  azithromycin (ZITHROMAX) 500 mg in dextrose 5 % 250 mL IVPB   Status:  Discontinued     500 mg 250 mL/hr over 60 Minutes Intravenous  Once 02/09/15 1424 02/09/15 1513      DVT Prophylaxis: SCD's-Has thrombocytopenia  Code Status:  DNR  Family Communication None at bedside  Procedures: None  CONSULTS:  pulmonary/intensive care and hematology/oncology  Time spent 30 minutes-Greater than 50% of this time was spent in counseling, explanation of diagnosis, planning of further management, and coordination of care.  MEDICATIONS: Scheduled Meds: . aspirin  81 mg Oral Daily  . azithromycin  500 mg Intravenous Q24H  . diltiazem  120 mg Oral Daily  . escitalopram  10 mg Oral Daily  . guaiFENesin  1,200 mg Oral BID  . methylPREDNISolone (SOLU-MEDROL) injection  200 mg Intravenous Daily  . piperacillin-tazobactam (ZOSYN)  IV  3.375 g Intravenous 3 times per day  . pravastatin  40 mg Oral QHS  . sodium chloride  3 mL Intravenous Q12H  . vancomycin  1,000 mg Intravenous Q8H   Continuous Infusions: . sodium chloride 10 mL/hr (02/10/15 1531)   PRN Meds:.acetaminophen **OR** acetaminophen, albuterol, alum & mag hydroxide-simeth, lidocaine-prilocaine, ondansetron **OR** ondansetron (ZOFRAN) IV    PHYSICAL EXAM: Vital signs in last 24 hours: Filed Vitals:   02/11/15 2131 02/12/15 0211 02/12/15 0629 02/12/15 0755  BP: 93/63  110/70   Pulse: 97 95 105   Temp: 99.3 F (37.4 C)  98.3 F (36.8 C)   TempSrc: Oral  Oral   Resp: 20 18    Height:      Weight:      SpO2: 91% 94% 95% 93%    Weight change:  Filed Weights   02/09/15 2100 02/10/15 0536 02/11/15 1900  Weight: 116.121 kg (256 lb) 116.393 kg (256 lb 9.6 oz) 110.224 kg (243 lb)   Body mass index is 32.95 kg/(m^2).   Gen Exam: Awake and alert with clear speech.   Neck: Supple, No JVD.   Chest: Bibasilar rales CVS: S1 S2 Regular, no murmurs.  Abdomen: soft, BS +, non tender, non distended.  Extremities: no edema, lower extremities warm to touch Neurologic: Non Focal.   Skin:  No Rash.   Wounds: N/A.    Intake/Output from previous day:  Intake/Output Summary (Last 24 hours) at 02/12/15 1101 Last data filed at 02/12/15 0511  Gross per 24 hour  Intake    750 ml  Output    675 ml  Net     75 ml     LAB RESULTS: CBC  Recent Labs Lab 02/05/15 1337 02/09/15 1118 02/10/15 0630 02/11/15 0518 02/12/15 0529  WBC 8.6 5.3 4.6 4.7 4.3  HGB 13.2 12.0* 11.3* 11.7* 11.9*  HCT 37.6* 35.2* 32.5* 33.0* 33.8*  PLT 94* 90* 79* 97* 116*  MCV 92 92.6 90.5 88.9 89.4  MCH 32.4 31.6  31.5 31.5 31.5  MCHC 35.1 34.1 34.8 35.5 35.2  RDW 15.9* 16.7* 16.5* 16.1* 15.9*  LYMPHSABS 0.8* 0.8  --   --   --   MONOABS  --  0.2  --   --   --   EOSABS 0.0 0.0  --   --   --   BASOSABS 0.0 0.0  --   --   --     Chemistries   Recent Labs Lab 02/05/15 1339 02/09/15 1118 02/10/15 0630 02/11/15 0518 02/12/15 0529  NA 136 132* 130* 130* 132*  K 3.7 3.2* 3.2* 3.4* 3.6  CL 101 95* 96* 92* 95*  CO2 25 25 25 25 28   GLUCOSE 83 89 98 91 100*  BUN 20 10 <5* 7 7  CREATININE 0.8 0.60* 0.65 0.89 1.14  CALCIUM 8.8 8.5* 8.2* 8.6* 8.8*  MG  --   --  1.7  --   --     CBG:  Recent Labs Lab 02/09/15 1146 02/10/15 0814 02/11/15 0823  GLUCAP 83 84 79    GFR Estimated Creatinine Clearance: 101.5 mL/min (by C-G formula based on Cr of 1.14).  Coagulation profile  Recent Labs Lab 02/09/15 2127  INR 1.07    Cardiac Enzymes No results for input(s): CKMB, TROPONINI, MYOGLOBIN in the last 168 hours.  Invalid input(s): CK  Invalid input(s): POCBNP No results for input(s): DDIMER in the last 72 hours. No results for input(s): HGBA1C in the last 72 hours.  Recent Labs  02/10/15 0630  CHOL 189  HDL 41  LDLCALC 103*  TRIG 227*  CHOLHDL 4.6   No results for input(s): TSH, T4TOTAL, T3FREE, THYROIDAB in the last 72 hours.  Invalid input(s): FREET3 No results for input(s): VITAMINB12, FOLATE, FERRITIN, TIBC, IRON, RETICCTPCT in the last 72 hours. No results for input(s):  LIPASE, AMYLASE in the last 72 hours.  Urine Studies No results for input(s): UHGB, CRYS in the last 72 hours.  Invalid input(s): UACOL, UAPR, USPG, UPH, UTP, UGL, UKET, UBIL, UNIT, UROB, ULEU, UEPI, UWBC, URBC, UBAC, CAST, UCOM, BILUA  MICROBIOLOGY: Recent Results (from the past 240 hour(s))  Culture, blood (routine x 2)     Status: None (Preliminary result)   Collection Time: 02/09/15 11:11 AM  Result Value Ref Range Status   Specimen Description BLOOD RIGHT FOREARM  Final   Special Requests BOTTLES DRAWN AEROBIC AND ANAEROBIC 5CC  Final   Culture   Final    NO GROWTH 2 DAYS Performed at [facility]    Report Status PENDING  Incomplete  Culture, blood (routine x 2)     Status: None (Preliminary result)   Collection Time: 02/09/15 11:23 AM  Result Value Ref Range Status   Specimen Description BLOOD RIGHT HAND  Final   Special Requests BOTTLES DRAWN AEROBIC AND ANAEROBIC 4.5CC EACH  Final   Culture   Final    NO GROWTH 2 DAYS Performed at [facility]    Report Status PENDING  Incomplete  Culture, expectorated sputum-assessment     Status: None   Collection Time: 02/12/15  7:36 AM  Result Value Ref Range Status   Specimen Description EXPECTORATED SPUTUM  Final   Special Requests NONE  Final   Sputum evaluation   Final    MICROSCOPIC FINDINGS SUGGEST THAT THIS SPECIMEN IS NOT REPRESENTATIVE OF LOWER RESPIRATORY SECRETIONS. PLEASE RECOLLECT. Results Called to: Olin Pia AT [id number] 02/12/15 BY L BENFIELD    Report Status 02/12/2015 FINAL  Final  RADIOLOGY STUDIES/RESULTS: Dg Chest 2 View  02/09/2015   CLINICAL DATA:  Fever, weakness for 1 week  EXAM: CHEST  2 VIEW  COMPARISON:  02/09/2015  FINDINGS: Cardiomediastinal silhouette is stable. Persistent streaky infiltrate/ pneumonia in right lower lobe. No pulmonary edema. Bony thorax is unremarkable.  IMPRESSION: Persistent streaky infiltrate/ pneumonia right lower lobe. No pulmonary edema.   Electronically  Signed   By: Lahoma Crocker M.D.   On: 02/09/2015 13:06   Ct Angio Chest Pe W/cm &/or Wo Cm  02/09/2015   CLINICAL DATA:  Hypoxia. Nausea, weakness, shortness of breath since chemotherapy 2 weeks ago.  EXAM: CT ANGIOGRAPHY CHEST WITH CONTRAST  TECHNIQUE: Multidetector CT imaging of the chest was performed using the standard protocol during bolus administration of intravenous contrast. Multiplanar CT image reconstructions and MIPs were obtained to evaluate the vascular anatomy.  CONTRAST:  146mL OMNIPAQUE IOHEXOL 350 MG/ML SOLN  COMPARISON:  Chest x-ray earlier today.  FINDINGS: Heart is normal size. Aorta is normal caliber. No mediastinal, hilar, or axillary adenopathy. No filling defects in the pulmonary arteries to suggest pulmonary emboli. There are bilateral airspace opacities, most confluent in the lower lobes, right greater than left, but also present in the upper lobes with ground-glass opacities present. Findings most compatible with multifocal pneumonia. No pleural effusions.  Chest wall soft tissues are unremarkable. Imaging into the upper abdomen shows no acute findings.  No acute bony abnormality or focal bone lesion.  Review of the MIP images confirms the above findings.  IMPRESSION: No evidence of pulmonary embolus.  Bilateral airspace opacities, most confluent in the lower lobes but also noted in the upper lobes concerning for multifocal pneumonia.   Electronically Signed   By: Rolm Baptise M.D.   On: 02/09/2015 14:05   Dg Chest Port 1 View  02/12/2015   CLINICAL DATA:  Health care associated pneumonia, CHF, acute respiratory failure with hypoxia.  EXAM: PORTABLE CHEST - 1 VIEW  COMPARISON:  Portable chest x-ray of February 11, 2015  FINDINGS: The lungs are mildly hypoinflated. The interstitial markings have improved and the confluent alveolar opacities bilaterally are less conspicuous. The left hemidiaphragm is better demonstrated. There is no significant pleural effusion. The cardiac silhouette is  normal in size. The bony thorax exhibits no acute abnormality.  IMPRESSION: Further interval improvement in bilateral pneumonia. The small left pleural effusion is less conspicuous today. When the patient can tolerate the procedure, a PA and lateral chest x-ray would be useful.   Electronically Signed   By: David  Martinique M.D.   On: 02/12/2015 07:28   Dg Chest Port 1 View  02/11/2015   CLINICAL DATA:  Acute respiratory failure with hypoxia, shortness of breath, CHF.  EXAM: PORTABLE CHEST - 1 VIEW  COMPARISON:  Chest x-ray and chest CT scan of February 09, 2015.  FINDINGS: The lungs are mildly hypoinflated. Persistent airspace opacities are present bilaterally greatest on the right in the upper lobe. The left hemidiaphragm is less well demonstrated today. The cardiac silhouette is enlarged. The pulmonary vascularity is indistinct. There is small left pleural effusion. There is chronic widening of the right AC joint.  IMPRESSION: Bilateral pneumonia which has increased in conspicuity since yesterday's study. There is a small left pleural effusion.   Electronically Signed   By: David  Martinique M.D.   On: 02/11/2015 07:19   Dg Chest Port 1 View  02/09/2015   CLINICAL DATA:  Shortness of breath, nausea and weakness  EXAM: PORTABLE CHEST - 1 VIEW  COMPARISON:  None.  FINDINGS: The heart size and mediastinal contours are within normal limits. Lungs are hypo aerated with crowding of the bronchovascular markings. Superimposed ill-defined right basilar airspace opacity is identified. The visualized skeletal structures are unremarkable.  IMPRESSION: Low volume exam with diffuse prominence of the interstitial markings and more focal ill-defined right lower lobe airspace opacity which could indicate atelectasis although early pneumonia could appear similar. If symptoms persist, consider PA and lateral chest radiographs obtained at full inspiration when the patient is clinically able.   Electronically Signed   By: Conchita Paris M.D.   On: 02/09/2015 11:40    Oren Binet, MD  [facility] Pager:[phone]  If 7PM-7AM, please contact night-coverage [email or web address] Password [email or web address] 02/12/2015, 11:01 AM   LOS: 3 days

## 2015-02-13 ENCOUNTER — Other Ambulatory Visit: Payer: BLUE CROSS/BLUE SHIELD

## 2015-02-13 ENCOUNTER — Ambulatory Visit: Payer: BLUE CROSS/BLUE SHIELD | Admitting: Hematology & Oncology

## 2015-02-13 ENCOUNTER — Ambulatory Visit: Payer: BLUE CROSS/BLUE SHIELD

## 2015-02-13 DIAGNOSIS — J9600 Acute respiratory failure, unspecified whether with hypoxia or hypercapnia: Secondary | ICD-10-CM

## 2015-02-13 DIAGNOSIS — C4371 Malignant melanoma of right lower limb, including hip: Secondary | ICD-10-CM

## 2015-02-13 DIAGNOSIS — D696 Thrombocytopenia, unspecified: Secondary | ICD-10-CM

## 2015-02-13 LAB — BASIC METABOLIC PANEL
Anion gap: 12 (ref 5–15)
BUN: 15 mg/dL (ref 6–20)
CHLORIDE: 98 mmol/L — AB (ref 101–111)
CO2: 23 mmol/L (ref 22–32)
CREATININE: 1.91 mg/dL — AB (ref 0.61–1.24)
Calcium: 9.2 mg/dL (ref 8.9–10.3)
GFR, EST AFRICAN AMERICAN: 46 mL/min — AB (ref >60)
GFR, EST NON AFRICAN AMERICAN: 40 mL/min — AB (ref >60)
Glucose, Bld: 169 mg/dL — ABNORMAL HIGH (ref 65–99)
POTASSIUM: 4.4 mmol/L (ref 3.5–5.1)
SODIUM: 133 mmol/L — AB (ref 135–145)

## 2015-02-13 LAB — GLUCOSE, CAPILLARY
GLUCOSE-CAPILLARY: 236 mg/dL — AB (ref 65–99)
GLUCOSE-CAPILLARY: 203 mg/dL — AB (ref 65–99)
Glucose-Capillary: 181 mg/dL — ABNORMAL HIGH (ref 65–99)

## 2015-02-13 MED ORDER — LEVOFLOXACIN IN D5W 750 MG/150ML IV SOLN
750.0000 mg | INTRAVENOUS | Status: DC
  Administered 2015-02-13 – 2015-02-15 (×3): 750 mg via INTRAVENOUS
  Filled 2015-02-13 (×4): qty 150

## 2015-02-13 MED ORDER — ENOXAPARIN SODIUM 40 MG/0.4ML ~~LOC~~ SOLN
40.0000 mg | SUBCUTANEOUS | Status: DC
  Administered 2015-02-13 – 2015-02-15 (×3): 40 mg via SUBCUTANEOUS
  Filled 2015-02-13 (×3): qty 0.4

## 2015-02-13 NOTE — Progress Notes (Signed)
Utilization Review completed. Skilar Marcou RN BSN CM 

## 2015-02-13 NOTE — Progress Notes (Signed)
PATIENT DETAILS Name: James Byrd Age: 49 y.o. Sex: male Date of Birth: Jan 16, 1966 Admit Date: 02/09/2015 Admitting Physician Ivor Costa, MD [id number], Herbie Baltimore, MD  Subjective: Still hypoxic-requiring high flow oxygen.Also gets SOB with minimal activity  Assessment/Plan: Principal Problem: Acute hypoxic respiratory failure: Secondary to presumed HCAP. Continue antibiotics (see below), FIO2 being titrated down.  Active Problems: HCAP: Was on empiric vancomycin, Zosyn and Zithromax-will narrow to Levaquin.On Ipilimumab-for metastatic melanoma-which apparently can cause pneumonitis.Seen by Oncology-started on IV Solumedrol for 4 doses-starting 8/25. Influenza PCR neg, HIV pending. Follow clincal course. PCCM following.   Dysphagia: Suspect more from respiratory distress, SLP evaluation completed-on full liquids.  History of paroxysmal SVT: Continue Cardizem.  History of anxiety: Continue Lexapro  History of dyslipidemia: Continue statin  History of chronic diastolic heart failure: Clinically compensated. Follow volume status closely.  Hypokalemia: Repleted  Thrombocytopenia: Seems to be a chronic issue-likely secondary to chemotherapy. Follow.  History of metastatic melanoma: Primary oncologist Dr. Syliva Overman  Disposition: Remain inpatient  Antimicrobial agents  See below  Anti-infectives    Start     Dose/Rate Route Frequency Ordered Stop   02/11/15 1800  azithromycin (ZITHROMAX) 500 mg in dextrose 5 % 250 mL IVPB     500 mg 250 mL/hr over 60 Minutes Intravenous Every 24 hours 02/11/15 1745     02/10/15 0000  azithromycin (ZITHROMAX) 500 mg in dextrose 5 % 250 mL IVPB  Status:  Discontinued     500 mg 250 mL/hr over 60 Minutes Intravenous Every 24 hours 02/09/15 1452 02/09/15 1513   02/10/15 0000  vancomycin (VANCOCIN) IVPB 1000 mg/200 mL premix  Status:  Discontinued     1,000 mg 200 mL/hr over 60 Minutes Intravenous Every 8 hours 02/09/15  1529 02/09/15 2010   02/09/15 2030  vancomycin (VANCOCIN) IVPB 1000 mg/200 mL premix     1,000 mg 200 mL/hr over 60 Minutes Intravenous Every 8 hours 02/09/15 2010     02/09/15 2030  piperacillin-tazobactam (ZOSYN) IVPB 3.375 g     3.375 g 12.5 mL/hr over 240 Minutes Intravenous 3 times per day 02/09/15 2015     02/09/15 1630  vancomycin (VANCOCIN) IVPB 1000 mg/200 mL premix  Status:  Discontinued     1,000 mg 200 mL/hr over 60 Minutes Intravenous  Once 02/09/15 1516 02/09/15 2010   02/09/15 1530  vancomycin (VANCOCIN) IVPB 1000 mg/200 mL premix     1,000 mg 200 mL/hr over 60 Minutes Intravenous  Once 02/09/15 1516 02/09/15 1653   02/09/15 1530  cefTAZidime (FORTAZ) 2 g in dextrose 5 % 50 mL IVPB  Status:  Discontinued     2 g 100 mL/hr over 30 Minutes Intravenous Every 8 hours 02/09/15 1524 02/09/15 1959   02/09/15 1520  cefTAZidime (FORTAZ) 1 G injection    Comments:  Collene Gobble   : cabinet override      02/09/15 1520 02/09/15 1651   02/09/15 1500  cefTAZidime (FORTAZ) 2 g in dextrose 5 % 50 mL IVPB  Status:  Discontinued     2 g 100 mL/hr over 30 Minutes Intravenous 3 times per day 02/09/15 1452 02/09/15 1513   02/09/15 1500  cefTAZidime (FORTAZ) 2 g in dextrose 5 % 50 mL IVPB  Status:  Discontinued     2 g 100 mL/hr over 30 Minutes Intravenous 3 times per day 02/09/15 1457 02/09/15 1524   02/09/15 1448  cefTRIAXone (ROCEPHIN) 1 G injection  Status:  Discontinued    Comments:  Collene Gobble   : cabinet override      02/09/15 1448 02/09/15 1528   02/09/15 1445  azithromycin (ZITHROMAX) 500 MG injection  Status:  Discontinued    Comments:  Collene Gobble   : cabinet override      02/09/15 1445 02/09/15 1528   02/09/15 1430  cefTRIAXone (ROCEPHIN) 1 g in dextrose 5 % 50 mL IVPB  Status:  Discontinued     1 g 100 mL/hr over 30 Minutes Intravenous  Once 02/09/15 1424 02/09/15 1513   02/09/15 1430  azithromycin (ZITHROMAX) 500 mg in dextrose 5 % 250 mL IVPB  Status:  Discontinued      500 mg 250 mL/hr over 60 Minutes Intravenous  Once 02/09/15 1424 02/09/15 1513      DVT Prophylaxis: Start Lovenox today-but watch platelets  Code Status:  DNR  Family Communication None at bedside  Procedures: None  CONSULTS:  pulmonary/intensive care and hematology/oncology  Time spent 25 minutes-Greater than 50% of this time was spent in counseling, explanation of diagnosis, planning of further management, and coordination of care.  MEDICATIONS: Scheduled Meds: . aspirin  81 mg Oral Daily  . azithromycin  500 mg Intravenous Q24H  . diltiazem  120 mg Oral Daily  . escitalopram  10 mg Oral Daily  . guaiFENesin  1,200 mg Oral BID  . insulin aspart  0-15 Units Subcutaneous TID WC  . methylPREDNISolone (SOLU-MEDROL) injection  200 mg Intravenous Daily  . piperacillin-tazobactam (ZOSYN)  IV  3.375 g Intravenous 3 times per day  . pravastatin  40 mg Oral QHS  . sodium chloride  3 mL Intravenous Q12H  . vancomycin  1,000 mg Intravenous Q8H   Continuous Infusions: . sodium chloride 10 mL/hr (02/10/15 1531)   PRN Meds:.acetaminophen **OR** acetaminophen, albuterol, alum & mag hydroxide-simeth, lidocaine-prilocaine, ondansetron **OR** ondansetron (ZOFRAN) IV    PHYSICAL EXAM: Vital signs in last 24 hours: Filed Vitals:   02/13/15 0544 02/13/15 0805 02/13/15 0816 02/13/15 1151  BP: 115/81     Pulse: 76   78  Temp: 97.5 F (36.4 C)     TempSrc: Oral     Resp: 18   18  Height:      Weight: 115.939 kg (255 lb 9.6 oz)     SpO2: 100% 94% 93% 94%    Weight change: 0.976 kg (2 lb 2.4 oz) Filed Weights   02/11/15 1900 02/12/15 1927 02/13/15 0544  Weight: 110.224 kg (243 lb) 111.2 kg (245 lb 2.4 oz) 115.939 kg (255 lb 9.6 oz)   Body mass index is 34.66 kg/(m^2).   Gen Exam: Awake and alert with clear speech.   Neck: Supple, No JVD.   Chest: Bibasilar rales CVS: S1 S2 Regular, no murmurs.  Abdomen: soft, BS +, non tender, non distended.  Extremities: no edema,  lower extremities warm to touch Neurologic: Non Focal.   Skin: No Rash.   Wounds: N/A.    Intake/Output from previous day:  Intake/Output Summary (Last 24 hours) at 02/13/15 1254 Last data filed at 02/13/15 1002  Gross per 24 hour  Intake    500 ml  Output    200 ml  Net    300 ml     LAB RESULTS: CBC  Recent Labs Lab 02/09/15 1118 02/10/15 0630 02/11/15 0518 02/12/15 0529  WBC 5.3 4.6 4.7 4.3  HGB 12.0* 11.3* 11.7* 11.9*  HCT 35.2* 32.5* 33.0* 33.8*  PLT 90* 79* 97* 116*  MCV 92.6 90.5 88.9 89.4  MCH 31.6 31.5 31.5 31.5  MCHC 34.1 34.8 35.5 35.2  RDW 16.7* 16.5* 16.1* 15.9*  LYMPHSABS 0.8  --   --   --   MONOABS 0.2  --   --   --   EOSABS 0.0  --   --   --   BASOSABS 0.0  --   --   --     Chemistries   Recent Labs Lab 02/09/15 1118 02/10/15 0630 02/11/15 0518 02/12/15 0529 02/13/15 0958  NA 132* 130* 130* 132* 133*  K 3.2* 3.2* 3.4* 3.6 4.4  CL 95* 96* 92* 95* 98*  CO2 25 25 25 28 23   GLUCOSE 89 98 91 100* 169*  BUN 10 <5* 7 7 15   CREATININE 0.60* 0.65 0.89 1.14 1.91*  CALCIUM 8.5* 8.2* 8.6* 8.8* 9.2  MG  --  1.7  --   --   --     CBG:  Recent Labs Lab 02/12/15 0858 02/12/15 1716 02/12/15 2129 02/13/15 0805 02/13/15 1148  GLUCAP 92 160* 236* 181* 203*    GFR Estimated Creatinine Clearance: 62.1 mL/min (by C-G formula based on Cr of 1.91).  Coagulation profile  Recent Labs Lab 02/09/15 2127  INR 1.07    Cardiac Enzymes No results for input(s): CKMB, TROPONINI, MYOGLOBIN in the last 168 hours.  Invalid input(s): CK  Invalid input(s): POCBNP No results for input(s): DDIMER in the last 72 hours. No results for input(s): HGBA1C in the last 72 hours. No results for input(s): CHOL, HDL, LDLCALC, TRIG, CHOLHDL, LDLDIRECT in the last 72 hours. No results for input(s): TSH, T4TOTAL, T3FREE, THYROIDAB in the last 72 hours.  Invalid input(s): FREET3 No results for input(s): VITAMINB12, FOLATE, FERRITIN, TIBC, IRON, RETICCTPCT in the  last 72 hours. No results for input(s): LIPASE, AMYLASE in the last 72 hours.  Urine Studies No results for input(s): UHGB, CRYS in the last 72 hours.  Invalid input(s): UACOL, UAPR, USPG, UPH, UTP, UGL, UKET, UBIL, UNIT, UROB, ULEU, UEPI, UWBC, URBC, UBAC, CAST, UCOM, BILUA  MICROBIOLOGY: Recent Results (from the past 240 hour(s))  Culture, blood (routine x 2)     Status: None (Preliminary result)   Collection Time: 02/09/15 11:11 AM  Result Value Ref Range Status   Specimen Description BLOOD RIGHT FOREARM  Final   Special Requests BOTTLES DRAWN AEROBIC AND ANAEROBIC 5CC  Final   Culture   Final    NO GROWTH 3 DAYS Performed at [facility]    Report Status PENDING  Incomplete  Culture, blood (routine x 2)     Status: None (Preliminary result)   Collection Time: 02/09/15 11:23 AM  Result Value Ref Range Status   Specimen Description BLOOD RIGHT HAND  Final   Special Requests BOTTLES DRAWN AEROBIC AND ANAEROBIC 4.5CC EACH  Final   Culture   Final    NO GROWTH 3 DAYS Performed at [facility]    Report Status PENDING  Incomplete  Culture, expectorated sputum-assessment     Status: None   Collection Time: 02/12/15  7:36 AM  Result Value Ref Range Status   Specimen Description EXPECTORATED SPUTUM  Final   Special Requests NONE  Final   Sputum evaluation   Final    MICROSCOPIC FINDINGS SUGGEST THAT THIS SPECIMEN IS NOT REPRESENTATIVE OF LOWER RESPIRATORY SECRETIONS. PLEASE RECOLLECT. Results Called to: Olin Pia AT [id number] 02/12/15 BY L BENFIELD    Report Status 02/12/2015 FINAL  Final    RADIOLOGY  STUDIES/RESULTS: Dg Chest 2 View  02/09/2015   CLINICAL DATA:  Fever, weakness for 1 week  EXAM: CHEST  2 VIEW  COMPARISON:  02/09/2015  FINDINGS: Cardiomediastinal silhouette is stable. Persistent streaky infiltrate/ pneumonia in right lower lobe. No pulmonary edema. Bony thorax is unremarkable.  IMPRESSION: Persistent streaky infiltrate/ pneumonia right lower lobe.  No pulmonary edema.   Electronically Signed   By: Lahoma Crocker M.D.   On: 02/09/2015 13:06   Ct Angio Chest Pe W/cm &/or Wo Cm  02/09/2015   CLINICAL DATA:  Hypoxia. Nausea, weakness, shortness of breath since chemotherapy 2 weeks ago.  EXAM: CT ANGIOGRAPHY CHEST WITH CONTRAST  TECHNIQUE: Multidetector CT imaging of the chest was performed using the standard protocol during bolus administration of intravenous contrast. Multiplanar CT image reconstructions and MIPs were obtained to evaluate the vascular anatomy.  CONTRAST:  182mL OMNIPAQUE IOHEXOL 350 MG/ML SOLN  COMPARISON:  Chest x-ray earlier today.  FINDINGS: Heart is normal size. Aorta is normal caliber. No mediastinal, hilar, or axillary adenopathy. No filling defects in the pulmonary arteries to suggest pulmonary emboli. There are bilateral airspace opacities, most confluent in the lower lobes, right greater than left, but also present in the upper lobes with ground-glass opacities present. Findings most compatible with multifocal pneumonia. No pleural effusions.  Chest wall soft tissues are unremarkable. Imaging into the upper abdomen shows no acute findings.  No acute bony abnormality or focal bone lesion.  Review of the MIP images confirms the above findings.  IMPRESSION: No evidence of pulmonary embolus.  Bilateral airspace opacities, most confluent in the lower lobes but also noted in the upper lobes concerning for multifocal pneumonia.   Electronically Signed   By: Rolm Baptise M.D.   On: 02/09/2015 14:05   Dg Chest Port 1 View  02/12/2015   CLINICAL DATA:  Health care associated pneumonia, CHF, acute respiratory failure with hypoxia.  EXAM: PORTABLE CHEST - 1 VIEW  COMPARISON:  Portable chest x-ray of February 11, 2015  FINDINGS: The lungs are mildly hypoinflated. The interstitial markings have improved and the confluent alveolar opacities bilaterally are less conspicuous. The left hemidiaphragm is better demonstrated. There is no significant pleural  effusion. The cardiac silhouette is normal in size. The bony thorax exhibits no acute abnormality.  IMPRESSION: Further interval improvement in bilateral pneumonia. The small left pleural effusion is less conspicuous today. When the patient can tolerate the procedure, a PA and lateral chest x-ray would be useful.   Electronically Signed   By: David  Martinique M.D.   On: 02/12/2015 07:28   Dg Chest Port 1 View  02/11/2015   CLINICAL DATA:  Acute respiratory failure with hypoxia, shortness of breath, CHF.  EXAM: PORTABLE CHEST - 1 VIEW  COMPARISON:  Chest x-ray and chest CT scan of February 09, 2015.  FINDINGS: The lungs are mildly hypoinflated. Persistent airspace opacities are present bilaterally greatest on the right in the upper lobe. The left hemidiaphragm is less well demonstrated today. The cardiac silhouette is enlarged. The pulmonary vascularity is indistinct. There is small left pleural effusion. There is chronic widening of the right AC joint.  IMPRESSION: Bilateral pneumonia which has increased in conspicuity since yesterday's study. There is a small left pleural effusion.   Electronically Signed   By: David  Martinique M.D.   On: 02/11/2015 07:19   Dg Chest Port 1 View  02/09/2015   CLINICAL DATA:  Shortness of breath, nausea and weakness  EXAM: PORTABLE CHEST - 1 VIEW  COMPARISON:  None.  FINDINGS: The heart size and mediastinal contours are within normal limits. Lungs are hypo aerated with crowding of the bronchovascular markings. Superimposed ill-defined right basilar airspace opacity is identified. The visualized skeletal structures are unremarkable.  IMPRESSION: Low volume exam with diffuse prominence of the interstitial markings and more focal ill-defined right lower lobe airspace opacity which could indicate atelectasis although early pneumonia could appear similar. If symptoms persist, consider PA and lateral chest radiographs obtained at full inspiration when the patient is clinically able.    Electronically Signed   By: Conchita Paris M.D.   On: 02/09/2015 11:40    Oren Binet, MD  [facility] Pager:[phone]  If 7PM-7AM, please contact night-coverage [email or web address] Password [email or web address] 02/13/2015, 12:54 PM   LOS: 4 days

## 2015-02-13 NOTE — Clinical Documentation Improvement (Signed)
Pulmonology  Can the diagnosis of systemic infection be further specified?   Sepsis - specify causative organism if known  Determine if there is Severe Sepsis (Sepsis with organ dysfunction - specify), Septic Shock if present}  Other  Clinically Undetermined  Please clarify if sepsis and if POA.  Document any associated diagnoses/conditions. HCAP, acute resp failure   Supporting Information:"ASSESSMENT/PLAN:  Acute hypoxic respiratory failure 2nd to Sepsis from HCAP (?viral) with small Lt pleural effusion in setting of melanoma s/p chemotherapy" per documentation  Temp 99.2 Blood cultures, Vanc and zosyn In ED, patient was found to have WBC 5.3, lactate 0.93, BNP 17, negative urinalysis, temperature 99.2, oxygen saturation 82 on room air, tachypnea, no tachycardia, potassium is 3.2. X-ray showed right lower lobe infiltration. CT angiogram of chest is negative for PE Please exercise your independent, professional judgment when responding. A specific answer is not anticipated or expected.   Thank You,  Philippa Chester, RN, BSN, [email or web address]

## 2015-02-13 NOTE — Progress Notes (Signed)
PCCM PROGRESS NOTE  ADMISSION DATE: 02/09/2015 CONSULT DATE: 02/10/2015 REFERRING PROVIDER: [facility]  CC: Short of breath  SUBJECTIVE: Feels like he is getting air into his lungs better.  Still on high flow oxygen.  Denies chest pain, rash, abdominal pain.  Not much cough and not much sputum.  OBJECTIVE: Temp:  [97.5 F (36.4 C)-98.6 F (37 C)] 97.5 F (36.4 C) (08/26 0544) Pulse Rate:  [76-93] 76 (08/26 0544) Resp:  [18-20] 18 (08/26 0544) BP: (101-115)/(64-82) 115/81 mmHg (08/26 0544) SpO2:  [91 %-100 %] 93 % (08/26 0816) FiO2 (%):  [75 %-80 %] 75 % (08/26 0816) Weight:  [245 lb 2.4 oz (111.2 kg)-255 lb 9.6 oz (115.939 kg)] 255 lb 9.6 oz (115.939 kg) (08/26 0544)   General: wearing high flow oxygen, sitting in chair HEENT: no sinus tenderness Cardiac: regular Chest: no wheeze Abd: soft, non tender Ext: no edema Neuro: normal strength Skin: no rashes   CMP Latest Ref Rng 02/12/2015 02/11/2015 02/10/2015  Glucose 65 - 99 mg/dL 100(H) 91 98  BUN 6 - 20 mg/dL 7 7 <5(L)  Creatinine 0.61 - 1.24 mg/dL 1.14 0.89 0.65  Sodium 135 - 145 mmol/L 132(L) 130(L) 130(L)  Potassium 3.5 - 5.1 mmol/L 3.6 3.4(L) 3.2(L)  Chloride 101 - 111 mmol/L 95(L) 92(L) 96(L)  CO2 22 - 32 mmol/L 28 25 25   Calcium 8.9 - 10.3 mg/dL 8.8(L) 8.6(L) 8.2(L)  Total Protein 6.5 - 8.1 g/dL - - -  Albumin 3.3 - 5.5 g/dL - - -  Total Bilirubin 0.3 - 1.2 mg/dL - - -  Alkaline Phos 38 - 126 U/L - - -  AST 15 - 41 U/L - - -  ALT 17 - 63 U/L - - -    CBC Latest Ref Rng 02/12/2015 02/11/2015 02/10/2015  WBC 4.0 - 10.5 K/uL 4.3 4.7 4.6  Hemoglobin 13.0 - 17.0 g/dL 11.9(L) 11.7(L) 11.3(L)  Hematocrit 39.0 - 52.0 % 33.8(L) 33.0(L) 32.5(L)  Platelets 150 - 400 K/uL 116(L) 97(L) 79(L)    Lab Results  Component Value Date   ESRSEDRATE 82* 02/10/2015    BNP    Component Value Date/Time   BNP 9.3 02/11/2015 0830    Dg Chest Port 1 View  02/12/2015   CLINICAL DATA:  Health care associated pneumonia, CHF, acute  respiratory failure with hypoxia.  EXAM: PORTABLE CHEST - 1 VIEW  COMPARISON:  Portable chest x-ray of February 11, 2015  FINDINGS: The lungs are mildly hypoinflated. The interstitial markings have improved and the confluent alveolar opacities bilaterally are less conspicuous. The left hemidiaphragm is better demonstrated. There is no significant pleural effusion. The cardiac silhouette is normal in size. The bony thorax exhibits no acute abnormality.  IMPRESSION: Further interval improvement in bilateral pneumonia. The small left pleural effusion is less conspicuous today. When the patient can tolerate the procedure, a PA and lateral chest x-ray would be useful.   Electronically Signed   By: David  Martinique M.D.   On: 02/12/2015 07:28     CULTURES: 8/22 Blood >> 8/22 Legionella Ag >> negative 8/22 Pneumococcal Ag >> negative 8/23 Influenza PCR >> negative  ANTIBIOTICS: 8/22 Fortaz >> 8/22 8/22 Zosyn >> 8/26 8/22 Vancomycin >> 8/26 8/24 Zithromax >> 8/26 8/26 Levaquin >>  STUDIES: 8/22 CT chest >> b/l ASD Rt > Lt 8/24 Echo >> mod LVH, EF 65 to [id number], grade 1 diastolic dysfx  EVENTS: [id number] Admit 8/24 Oncology consulted 8/25 Start high dose solumedrol per oncology  DISCUSSION: 49 yo male presented  with weakness, fever, nausea, cough, dyspnea, hypoxia from b/l pneumonia.  He has hx of Stage IIIB [id number]) melanoma of Rt toe s/p chemotherapy with Curt Bears (has 5% reported incidence of pneumonitis) >> last on 01/26/15.  CXR shows some improvement on 8/25, and he has clinical improvement 8/26.  ASSESSMENT/PLAN:  Acute hypoxic respiratory failure 2nd to Sepsis from HCAP (?viral) with small Lt pleural effusion in setting of melanoma s/p chemotherapy.  Less likely pulmonary infiltrates are related to chemotherapy induced pneumonitis. Plan: - oxygen to keep SpO2 90 to 95% - using high flow oxygen - f/u CXR 8/27 - day 5 of Abx >> agree with plan to narrow abx to levaquin on 8/26 - solumedrol ordered  8/25 by oncology >> plan stop date is 8/28  Dysphagia related to respiratory distress. Plan: - full liquid diet - f/u with speech therapy   D/w Dr. Sloan Leiter.   Chesley Mires, MD [facility] Pulmonary/Critical Care 02/13/2015, 10:04 AM Pager:  [phone] After 3pm call: [phone]

## 2015-02-13 NOTE — Progress Notes (Addendum)
ANTIBIOTIC CONSULT NOTE  Pharmacy Consult for Levofloxacin Indication: HCAP  No Known Allergies  Patient Measurements: Height: 6' (182.9 cm) Weight: 255 lb 9.6 oz (115.939 kg) IBW/kg (Calculated) : 77.6  Vital Signs: Temp: 97.5 F (36.4 C) (08/26 0544) Temp Source: Oral (08/26 0544) BP: 115/81 mmHg (08/26 0544) Pulse Rate: 78 (08/26 1151) Intake/Output from previous day: 08/25 0701 - 08/26 0700 In: 120 [P.O.:120] Out: 200 [Urine:200] Intake/Output from this shift: Total I/O In: 380 [P.O.:380] Out: -   Labs:  Recent Labs  02/11/15 0518 02/12/15 0529 02/13/15 0958  WBC 4.7 4.3  --   HGB 11.7* 11.9*  --   PLT 97* 116*  --   CREATININE 0.89 1.14 1.91*   Estimated Creatinine Clearance: 62.1 mL/min (by C-G formula based on Cr of 1.91).  Recent Labs  02/11/15 1200  [place] 15     Microbiology: Recent Results (from the past 720 hour(s))  Culture, blood (routine x 2)     Status: None (Preliminary result)   Collection Time: 02/09/15 11:11 AM  Result Value Ref Range Status   Specimen Description BLOOD RIGHT FOREARM  Final   Special Requests BOTTLES DRAWN AEROBIC AND ANAEROBIC 5CC  Final   Culture   Final    NO GROWTH 3 DAYS Performed at [facility]    Report Status PENDING  Incomplete  Culture, blood (routine x 2)     Status: None (Preliminary result)   Collection Time: 02/09/15 11:23 AM  Result Value Ref Range Status   Specimen Description BLOOD RIGHT HAND  Final   Special Requests BOTTLES DRAWN AEROBIC AND ANAEROBIC 4.5CC EACH  Final   Culture   Final    NO GROWTH 3 DAYS Performed at [facility]    Report Status PENDING  Incomplete  Culture, expectorated sputum-assessment     Status: None   Collection Time: 02/12/15  7:36 AM  Result Value Ref Range Status   Specimen Description EXPECTORATED SPUTUM  Final   Special Requests NONE  Final   Sputum evaluation   Final    MICROSCOPIC FINDINGS SUGGEST THAT THIS SPECIMEN IS NOT  REPRESENTATIVE OF LOWER RESPIRATORY SECRETIONS. PLEASE RECOLLECT. Results Called to: Olin Pia AT [id number] 02/12/15 BY L BENFIELD    Report Status 02/12/2015 FINAL  Final    Anti-infectives    Start     Dose/Rate Route Frequency Ordered Stop   02/11/15 1800  azithromycin (ZITHROMAX) 500 mg in dextrose 5 % 250 mL IVPB  Status:  Discontinued     500 mg 250 mL/hr over 60 Minutes Intravenous Every 24 hours 02/11/15 1745 02/13/15 1257   02/10/15 0000  azithromycin (ZITHROMAX) 500 mg in dextrose 5 % 250 mL IVPB  Status:  Discontinued     500 mg 250 mL/hr over 60 Minutes Intravenous Every 24 hours 02/09/15 1452 02/09/15 1513   02/10/15 0000  vancomycin (VANCOCIN) IVPB 1000 mg/200 mL premix  Status:  Discontinued     1,000 mg 200 mL/hr over 60 Minutes Intravenous Every 8 hours 02/09/15 1529 02/09/15 2010   02/09/15 2030  vancomycin (VANCOCIN) IVPB 1000 mg/200 mL premix  Status:  Discontinued     1,000 mg 200 mL/hr over 60 Minutes Intravenous Every 8 hours 02/09/15 2010 02/13/15 1257   02/09/15 2030  piperacillin-tazobactam (ZOSYN) IVPB 3.375 g  Status:  Discontinued     3.375 g 12.5 mL/hr over 240 Minutes Intravenous 3 times per day 02/09/15 2015 02/13/15 1257   02/09/15 1630  vancomycin (VANCOCIN) IVPB 1000  mg/200 mL premix  Status:  Discontinued     1,000 mg 200 mL/hr over 60 Minutes Intravenous  Once 02/09/15 1516 02/09/15 2010   02/09/15 1530  vancomycin (VANCOCIN) IVPB 1000 mg/200 mL premix     1,000 mg 200 mL/hr over 60 Minutes Intravenous  Once 02/09/15 1516 02/09/15 1653   02/09/15 1530  cefTAZidime (FORTAZ) 2 g in dextrose 5 % 50 mL IVPB  Status:  Discontinued     2 g 100 mL/hr over 30 Minutes Intravenous Every 8 hours 02/09/15 1524 02/09/15 1959   02/09/15 1520  cefTAZidime (FORTAZ) 1 G injection    Comments:  Collene Gobble   : cabinet override      02/09/15 1520 02/09/15 1651   02/09/15 1500  cefTAZidime (FORTAZ) 2 g in dextrose 5 % 50 mL IVPB  Status:  Discontinued     2  g 100 mL/hr over 30 Minutes Intravenous 3 times per day 02/09/15 1452 02/09/15 1513   02/09/15 1500  cefTAZidime (FORTAZ) 2 g in dextrose 5 % 50 mL IVPB  Status:  Discontinued     2 g 100 mL/hr over 30 Minutes Intravenous 3 times per day 02/09/15 1457 02/09/15 1524   02/09/15 1448  cefTRIAXone (ROCEPHIN) 1 G injection  Status:  Discontinued    Comments:  Collene Gobble   : cabinet override      02/09/15 1448 02/09/15 1528   02/09/15 1445  azithromycin (ZITHROMAX) 500 MG injection  Status:  Discontinued    Comments:  Collene Gobble   : cabinet override      02/09/15 1445 02/09/15 1528   02/09/15 1430  cefTRIAXone (ROCEPHIN) 1 g in dextrose 5 % 50 mL IVPB  Status:  Discontinued     1 g 100 mL/hr over 30 Minutes Intravenous  Once 02/09/15 1424 02/09/15 1513   02/09/15 1430  azithromycin (ZITHROMAX) 500 mg in dextrose 5 % 250 mL IVPB  Status:  Discontinued     500 mg 250 mL/hr over 60 Minutes Intravenous  Once 02/09/15 1424 02/09/15 1513      Assessment: 49 yo M S/p empiric Vanc/Zosyn/azithro D#4 (narrowed to levo) for r/o HCAP. Afeb; WBC wnl. CPT 0.14>>0.22>0.24  8/22 BCx2 >> ngtd 8/22 pneumococcal Ag neg 8/22 legionella Ag neg 8/23 Flu neg  8/22 Ceftaz x1 8/22 Vanc >> 8/26 8/22 Zosyn >> 8/26 8/24 Azithro >> 8/26 8/26 levo>>  8/24 VT = 15 , no change  Pt is immunocompromised with ongoing chemotherapy for melanoma.    Vancomycin level = 15 (goal 15-20)  SCr 0.89 on admit, now increased to 1.91. Normalized CrCl~50  Goal of Therapy:  Vancomycin trough level 15-20 mcg/ml  Plan:  Levo 750mg  IV 24h Mon clinical progress, c/s, renal function, abx LOT Keep IV for now per MD consult   Elicia Lamp, PharmD Clinical Pharmacist Pager [phone] 02/13/2015 1:05 PM

## 2015-02-13 NOTE — Progress Notes (Signed)
IP PROGRESS NOTE  Subjective:   He reports improved dyspnea compared to hospital admission. No new complaint.  Objective: Vital signs in last 24 hours: Blood pressure 115/77, pulse 88, temperature 98.5 F (36.9 C), temperature source Oral, resp. rate 20, height 6' (1.829 m), weight 255 lb 9.6 oz (115.939 kg), SpO2 96 %.  Intake/Output from previous day: 08/25 0701 - 08/26 0700 In: 120 [P.O.:120] Out: 200 [Urine:200]  Physical Exam:  HEENT: No thrush Lungs: Decreased breath sounds at the lower chest with scattered inspiratory rhonchi, no respiratory distress Cardiac: Regular rate and rhythm Abdomen: No hepatosplenomegaly Extremities: No leg edema Lymph nodes: No right inguinal nodes    Lab Results:  Recent Labs  02/11/15 0518 02/12/15 0529  WBC 4.7 4.3  HGB 11.7* 11.9*  HCT 33.0* 33.8*  PLT 97* 116*    BMET  Recent Labs  02/12/15 0529 02/13/15 0958  NA 132* 133*  K 3.6 4.4  CL 95* 98*  CO2 28 23  GLUCOSE 100* 169*  BUN 7 15  CREATININE 1.14 1.91*  CALCIUM 8.8* 9.2    Studies/Results: Dg Chest Port 1 View  02/12/2015   CLINICAL DATA:  Health care associated pneumonia, CHF, acute respiratory failure with hypoxia.  EXAM: PORTABLE CHEST - 1 VIEW  COMPARISON:  Portable chest x-ray of February 11, 2015  FINDINGS: The lungs are mildly hypoinflated. The interstitial markings have improved and the confluent alveolar opacities bilaterally are less conspicuous. The left hemidiaphragm is better demonstrated. There is no significant pleural effusion. The cardiac silhouette is normal in size. The bony thorax exhibits no acute abnormality.  IMPRESSION: Further interval improvement in bilateral pneumonia. The small left pleural effusion is less conspicuous today. When the patient can tolerate the procedure, a PA and lateral chest x-ray would be useful.   Electronically Signed   By: David  Martinique M.D.   On: 02/12/2015 07:28    Medications: I have reviewed the patient's current  medications.  Assessment/Plan:  1. Melanoma, stage IIIB, subungual melanoma of the right hallux-status post ipilumumab for 3 cycles  2. Respiratory failure-pneumonia versus drug-induced pneumonitis, clinical improvement with steroids and antibiotics  3. Mild thrombocytopenia -potentially related to infection   Recommendations: Continue management of respiratory failure per internal medicine and pulmonary medicine. If respiratory status does not continue to improve consider atypical pneumonia such as PCP.  Please call oncology as needed. Dr. Marin Olp will see him 02/16/2015.  LOS: 4 days   James Byrd  02/13/2015, 2:55 PM

## 2015-02-14 ENCOUNTER — Inpatient Hospital Stay (HOSPITAL_COMMUNITY): Payer: BLUE CROSS/BLUE SHIELD

## 2015-02-14 DIAGNOSIS — N179 Acute kidney failure, unspecified: Secondary | ICD-10-CM

## 2015-02-14 DIAGNOSIS — J189 Pneumonia, unspecified organism: Secondary | ICD-10-CM | POA: Diagnosis present

## 2015-02-14 DIAGNOSIS — T50905A Adverse effect of unspecified drugs, medicaments and biological substances, initial encounter: Secondary | ICD-10-CM

## 2015-02-14 DIAGNOSIS — J9601 Acute respiratory failure with hypoxia: Secondary | ICD-10-CM

## 2015-02-14 LAB — GLUCOSE, CAPILLARY
GLUCOSE-CAPILLARY: 134 mg/dL — AB (ref 65–99)
GLUCOSE-CAPILLARY: 187 mg/dL — AB (ref 65–99)
GLUCOSE-CAPILLARY: 126 mg/dL — AB (ref 65–99)
Glucose-Capillary: 160 mg/dL — ABNORMAL HIGH (ref 65–99)
Glucose-Capillary: 143 mg/dL — ABNORMAL HIGH (ref 65–99)
Glucose-Capillary: 143 mg/dL — ABNORMAL HIGH (ref 65–99)

## 2015-02-14 LAB — URINALYSIS, ROUTINE W REFLEX MICROSCOPIC
BILIRUBIN URINE: NEGATIVE
Glucose, UA: NEGATIVE mg/dL
Hgb urine dipstick: NEGATIVE
KETONES UR: NEGATIVE mg/dL
Leukocytes, UA: NEGATIVE
NITRITE: NEGATIVE
Protein, ur: 30 mg/dL — AB
Specific Gravity, Urine: 1.016 (ref 1.005–1.030)
UROBILINOGEN UA: 0.2 mg/dL (ref 0.0–1.0)
pH: 5 (ref 5.0–8.0)

## 2015-02-14 LAB — BASIC METABOLIC PANEL
ANION GAP: 13 (ref 5–15)
BUN: 20 mg/dL (ref 6–20)
CALCIUM: 9.4 mg/dL (ref 8.9–10.3)
CO2: 21 mmol/L — ABNORMAL LOW (ref 22–32)
Chloride: 100 mmol/L — ABNORMAL LOW (ref 101–111)
Creatinine, Ser: 2.04 mg/dL — ABNORMAL HIGH (ref 0.61–1.24)
GFR, EST AFRICAN AMERICAN: 43 mL/min — AB (ref >60)
GFR, EST NON AFRICAN AMERICAN: 37 mL/min — AB (ref >60)
Glucose, Bld: 135 mg/dL — ABNORMAL HIGH (ref 65–99)
Potassium: 4.6 mmol/L (ref 3.5–5.1)
Sodium: 134 mmol/L — ABNORMAL LOW (ref 135–145)

## 2015-02-14 LAB — CBC
HEMATOCRIT: 32.6 % — AB (ref 39.0–52.0)
Hemoglobin: 11.6 g/dL — ABNORMAL LOW (ref 13.0–17.0)
MCH: 31.4 pg (ref 26.0–34.0)
MCHC: 35.6 g/dL (ref 30.0–36.0)
MCV: 88.1 fL (ref 78.0–100.0)
PLATELETS: 171 10*3/uL (ref 150–400)
RBC: 3.7 MIL/uL — ABNORMAL LOW (ref 4.22–5.81)
RDW: 15.6 % — AB (ref 11.5–15.5)
WBC: 9.2 10*3/uL (ref 4.0–10.5)

## 2015-02-14 LAB — CULTURE, BLOOD (ROUTINE X 2)
Culture: NO GROWTH
Culture: NO GROWTH

## 2015-02-14 LAB — URINE MICROSCOPIC-ADD ON

## 2015-02-14 LAB — PROCALCITONIN

## 2015-02-14 MED ORDER — SODIUM CHLORIDE 0.9 % IV SOLN
INTRAVENOUS | Status: DC
  Administered 2015-02-14 – 2015-02-15 (×3): via INTRAVENOUS

## 2015-02-14 NOTE — Progress Notes (Signed)
Patient currently on 5L nasal cannula and tolerating well.  Will continue to monitor.

## 2015-02-14 NOTE — Progress Notes (Signed)
Pt on nasal cannula and tolerating well at this time, high flow on standby in room if pt needs it

## 2015-02-14 NOTE — Progress Notes (Signed)
PATIENT DETAILS Name: James Byrd Age: 49 y.o. Sex: male Date of Birth: 10-29-1965 Admit Date: 02/09/2015 Admitting Physician Ivor Costa, MD [id number], Herbie Baltimore, MD  Subjective: Improving-now off high flow oxygen and on 6 L of O2 via nasal cannula.  Assessment/Plan: Principal Problem: Acute hypoxic respiratory failure: Secondary to presumed HCAP. Much improved. Continue antibiotics (see below), now off high flow oxygen and on 6 L of O2 via nasal cannula.FIO2 being titrated down.  Active Problems: HCAP: Much improved, requiring significantly less oxygen. Was on empiric vancomycin, Zosyn and Zithromax-nowl narrowed to Levaquin.On Ipilimumab-for metastatic melanoma-which apparently can cause pneumonitis.Seen by Oncology-started on IV Solumedrol for 4 doses-starting 8/25. Influenza PCR neg, HIV neg. Follow clincal course. PCCM following.   Dysphagia: Suspect more from respiratory distress, SLP evaluation completed-on full liquids.  Mild ARF: Euvolemic on exam, await repeat electrolytes today. Follow for now  History of paroxysmal SVT: Continue Cardizem.  History of anxiety: Continue Lexapro  History of dyslipidemia: Continue statin  History of chronic diastolic heart failure: Clinically compensated. Follow volume status closely.  Hypokalemia: Repleted  Thrombocytopenia: Resolved, could be secondary to chemotherapy. Follow.  History of metastatic melanoma: Primary oncologist Dr. Syliva Overman  Disposition: Remain inpatient-home in the next 2-3 days if clinical improvement continues  Antimicrobial agents  See below  Anti-infectives    Start     Dose/Rate Route Frequency Ordered Stop   02/13/15 1800  levofloxacin (LEVAQUIN) IVPB 750 mg     750 mg 100 mL/hr over 90 Minutes Intravenous Every 24 hours 02/13/15 1407     02/11/15 1800  azithromycin (ZITHROMAX) 500 mg in dextrose 5 % 250 mL IVPB  Status:  Discontinued     500 mg 250 mL/hr over 60 Minutes  Intravenous Every 24 hours 02/11/15 1745 02/13/15 1257   02/10/15 0000  azithromycin (ZITHROMAX) 500 mg in dextrose 5 % 250 mL IVPB  Status:  Discontinued     500 mg 250 mL/hr over 60 Minutes Intravenous Every 24 hours 02/09/15 1452 02/09/15 1513   02/10/15 0000  vancomycin (VANCOCIN) IVPB 1000 mg/200 mL premix  Status:  Discontinued     1,000 mg 200 mL/hr over 60 Minutes Intravenous Every 8 hours 02/09/15 1529 02/09/15 2010   02/09/15 2030  vancomycin (VANCOCIN) IVPB 1000 mg/200 mL premix  Status:  Discontinued     1,000 mg 200 mL/hr over 60 Minutes Intravenous Every 8 hours 02/09/15 2010 02/13/15 1257   02/09/15 2030  piperacillin-tazobactam (ZOSYN) IVPB 3.375 g  Status:  Discontinued     3.375 g 12.5 mL/hr over 240 Minutes Intravenous 3 times per day 02/09/15 2015 02/13/15 1257   02/09/15 1630  vancomycin (VANCOCIN) IVPB 1000 mg/200 mL premix  Status:  Discontinued     1,000 mg 200 mL/hr over 60 Minutes Intravenous  Once 02/09/15 1516 02/09/15 2010   02/09/15 1530  vancomycin (VANCOCIN) IVPB 1000 mg/200 mL premix     1,000 mg 200 mL/hr over 60 Minutes Intravenous  Once 02/09/15 1516 02/09/15 1653   02/09/15 1530  cefTAZidime (FORTAZ) 2 g in dextrose 5 % 50 mL IVPB  Status:  Discontinued     2 g 100 mL/hr over 30 Minutes Intravenous Every 8 hours 02/09/15 1524 02/09/15 1959   02/09/15 1520  cefTAZidime (FORTAZ) 1 G injection    Comments:  Collene Gobble   : cabinet override      02/09/15 1520 02/09/15 1651   02/09/15 1500  cefTAZidime (FORTAZ) 2 g in dextrose 5 % 50 mL IVPB  Status:  Discontinued     2 g 100 mL/hr over 30 Minutes Intravenous 3 times per day 02/09/15 1452 02/09/15 1513   02/09/15 1500  cefTAZidime (FORTAZ) 2 g in dextrose 5 % 50 mL IVPB  Status:  Discontinued     2 g 100 mL/hr over 30 Minutes Intravenous 3 times per day 02/09/15 1457 02/09/15 1524   02/09/15 1448  cefTRIAXone (ROCEPHIN) 1 G injection  Status:  Discontinued    Comments:  Collene Gobble   : cabinet  override      02/09/15 1448 02/09/15 1528   02/09/15 1445  azithromycin (ZITHROMAX) 500 MG injection  Status:  Discontinued    Comments:  Collene Gobble   : cabinet override      02/09/15 1445 02/09/15 1528   02/09/15 1430  cefTRIAXone (ROCEPHIN) 1 g in dextrose 5 % 50 mL IVPB  Status:  Discontinued     1 g 100 mL/hr over 30 Minutes Intravenous  Once 02/09/15 1424 02/09/15 1513   02/09/15 1430  azithromycin (ZITHROMAX) 500 mg in dextrose 5 % 250 mL IVPB  Status:  Discontinued     500 mg 250 mL/hr over 60 Minutes Intravenous  Once 02/09/15 1424 02/09/15 1513      DVT Prophylaxis: Prophylactic Lovenox -but watch platelets  Code Status: Full code  Family Communication Spouse at bedside  Procedures: None  CONSULTS:  pulmonary/intensive care and hematology/oncology  Time spent 25 minutes-Greater than 50% of this time was spent in counseling, explanation of diagnosis, planning of further management, and coordination of care.  MEDICATIONS: Scheduled Meds: . aspirin  81 mg Oral Daily  . diltiazem  120 mg Oral Daily  . enoxaparin (LOVENOX) injection  40 mg Subcutaneous Q24H  . escitalopram  10 mg Oral Daily  . guaiFENesin  1,200 mg Oral BID  . insulin aspart  0-15 Units Subcutaneous TID WC  . levofloxacin (LEVAQUIN) IV  750 mg Intravenous Q24H  . methylPREDNISolone (SOLU-MEDROL) injection  200 mg Intravenous Daily  . pravastatin  40 mg Oral QHS  . sodium chloride  3 mL Intravenous Q12H   Continuous Infusions: . sodium chloride 10 mL/hr (02/10/15 1531)   PRN Meds:.acetaminophen **OR** acetaminophen, albuterol, alum & mag hydroxide-simeth, lidocaine-prilocaine, ondansetron **OR** ondansetron (ZOFRAN) IV    PHYSICAL EXAM: Vital signs in last 24 hours: Filed Vitals:   02/14/15 0142 02/14/15 0604 02/14/15 0613 02/14/15 0822  BP:   121/74   Pulse:   79 83  Temp:   97.6 F (36.4 C)   TempSrc:   Oral   Resp:   20 20  Height:      Weight:  109.7 kg (241 lb 13.5 oz)      SpO2: 96%  96% 94%    Weight change: -1.5 kg (-3 lb 4.9 oz) Filed Weights   02/12/15 1927 02/13/15 0544 02/14/15 0604  Weight: 111.2 kg (245 lb 2.4 oz) 115.939 kg (255 lb 9.6 oz) 109.7 kg (241 lb 13.5 oz)   Body mass index is 32.79 kg/(m^2).   Gen Exam: Awake and alert with clear speech.   Neck: Supple, No JVD.   Chest: Few bibasilar rales but mostly clear today CVS: S1 S2 Regular, no murmurs.  Abdomen: soft, BS +, non tender, non distended.  Extremities: no edema, lower extremities warm to touch Neurologic: Non Focal.   Skin: No Rash.   Wounds: N/A.    Intake/Output from previous day:  Intake/Output Summary (Last 24 hours) at 02/14/15 0955 Last data filed at 02/14/15 [id number]  Gross per 24 hour  Intake 1241.6 ml  Output   2700 ml  Net -1458.4 ml     LAB RESULTS: CBC  Recent Labs Lab 02/09/15 1118 02/10/15 0630 02/11/15 0518 02/12/15 0529 02/14/15 0502  WBC 5.3 4.6 4.7 4.3 9.2  HGB 12.0* 11.3* 11.7* 11.9* 11.6*  HCT 35.2* 32.5* 33.0* 33.8* 32.6*  PLT 90* 79* 97* 116* 171  MCV 92.6 90.5 88.9 89.4 88.1  MCH 31.6 31.5 31.5 31.5 31.4  MCHC 34.1 34.8 35.5 35.2 35.6  RDW 16.7* 16.5* 16.1* 15.9* 15.6*  LYMPHSABS 0.8  --   --   --   --   MONOABS 0.2  --   --   --   --   EOSABS 0.0  --   --   --   --   BASOSABS 0.0  --   --   --   --     Chemistries   Recent Labs Lab 02/09/15 1118 02/10/15 0630 02/11/15 0518 02/12/15 0529 02/13/15 0958  NA 132* 130* 130* 132* 133*  K 3.2* 3.2* 3.4* 3.6 4.4  CL 95* 96* 92* 95* 98*  CO2 25 25 25 28 23   GLUCOSE 89 98 91 100* 169*  BUN 10 <5* 7 7 15   CREATININE 0.60* 0.65 0.89 1.14 1.91*  CALCIUM 8.5* 8.2* 8.6* 8.8* 9.2  MG  --  1.7  --   --   --     CBG:  Recent Labs Lab 02/12/15 0858 02/12/15 1716 02/12/15 2129 02/13/15 0805 02/13/15 1148  GLUCAP 92 160* 236* 181* 203*    GFR Estimated Creatinine Clearance: 60.5 mL/min (by C-G formula based on Cr of 1.91).  Coagulation profile  Recent Labs Lab  02/09/15 2127  INR 1.07    Cardiac Enzymes No results for input(s): CKMB, TROPONINI, MYOGLOBIN in the last 168 hours.  Invalid input(s): CK  Invalid input(s): POCBNP No results for input(s): DDIMER in the last 72 hours. No results for input(s): HGBA1C in the last 72 hours. No results for input(s): CHOL, HDL, LDLCALC, TRIG, CHOLHDL, LDLDIRECT in the last 72 hours. No results for input(s): TSH, T4TOTAL, T3FREE, THYROIDAB in the last 72 hours.  Invalid input(s): FREET3 No results for input(s): VITAMINB12, FOLATE, FERRITIN, TIBC, IRON, RETICCTPCT in the last 72 hours. No results for input(s): LIPASE, AMYLASE in the last 72 hours.  Urine Studies No results for input(s): UHGB, CRYS in the last 72 hours.  Invalid input(s): UACOL, UAPR, USPG, UPH, UTP, UGL, UKET, UBIL, UNIT, UROB, ULEU, UEPI, UWBC, URBC, UBAC, CAST, UCOM, BILUA  MICROBIOLOGY: Recent Results (from the past 240 hour(s))  Culture, blood (routine x 2)     Status: None (Preliminary result)   Collection Time: 02/09/15 11:11 AM  Result Value Ref Range Status   Specimen Description BLOOD RIGHT FOREARM  Final   Special Requests BOTTLES DRAWN AEROBIC AND ANAEROBIC 5CC  Final   Culture   Final    NO GROWTH 4 DAYS Performed at [facility]    Report Status PENDING  Incomplete  Culture, blood (routine x 2)     Status: None (Preliminary result)   Collection Time: 02/09/15 11:23 AM  Result Value Ref Range Status   Specimen Description BLOOD RIGHT HAND  Final   Special Requests BOTTLES DRAWN AEROBIC AND ANAEROBIC 4.5CC EACH  Final   Culture   Final    NO GROWTH 4 DAYS Performed at  [facility]    Report Status PENDING  Incomplete  Culture, expectorated sputum-assessment     Status: None   Collection Time: 02/12/15  7:36 AM  Result Value Ref Range Status   Specimen Description EXPECTORATED SPUTUM  Final   Special Requests NONE  Final   Sputum evaluation   Final    MICROSCOPIC FINDINGS SUGGEST THAT THIS  SPECIMEN IS NOT REPRESENTATIVE OF LOWER RESPIRATORY SECRETIONS. PLEASE RECOLLECT. Results Called to: Olin Pia AT [id number] 02/12/15 BY L BENFIELD    Report Status 02/12/2015 FINAL  Final    RADIOLOGY STUDIES/RESULTS: Dg Chest 2 View  02/09/2015   CLINICAL DATA:  Fever, weakness for 1 week  EXAM: CHEST  2 VIEW  COMPARISON:  02/09/2015  FINDINGS: Cardiomediastinal silhouette is stable. Persistent streaky infiltrate/ pneumonia in right lower lobe. No pulmonary edema. Bony thorax is unremarkable.  IMPRESSION: Persistent streaky infiltrate/ pneumonia right lower lobe. No pulmonary edema.   Electronically Signed   By: Lahoma Crocker M.D.   On: 02/09/2015 13:06   Ct Angio Chest Pe W/cm &/or Wo Cm  02/09/2015   CLINICAL DATA:  Hypoxia. Nausea, weakness, shortness of breath since chemotherapy 2 weeks ago.  EXAM: CT ANGIOGRAPHY CHEST WITH CONTRAST  TECHNIQUE: Multidetector CT imaging of the chest was performed using the standard protocol during bolus administration of intravenous contrast. Multiplanar CT image reconstructions and MIPs were obtained to evaluate the vascular anatomy.  CONTRAST:  155mL OMNIPAQUE IOHEXOL 350 MG/ML SOLN  COMPARISON:  Chest x-ray earlier today.  FINDINGS: Heart is normal size. Aorta is normal caliber. No mediastinal, hilar, or axillary adenopathy. No filling defects in the pulmonary arteries to suggest pulmonary emboli. There are bilateral airspace opacities, most confluent in the lower lobes, right greater than left, but also present in the upper lobes with ground-glass opacities present. Findings most compatible with multifocal pneumonia. No pleural effusions.  Chest wall soft tissues are unremarkable. Imaging into the upper abdomen shows no acute findings.  No acute bony abnormality or focal bone lesion.  Review of the MIP images confirms the above findings.  IMPRESSION: No evidence of pulmonary embolus.  Bilateral airspace opacities, most confluent in the lower lobes but also noted in the  upper lobes concerning for multifocal pneumonia.   Electronically Signed   By: Rolm Baptise M.D.   On: 02/09/2015 14:05   Dg Chest Port 1 View  02/14/2015   CLINICAL DATA:  49 year old male with history of healthcare associated pneumonia.  EXAM: PORTABLE CHEST - 1 VIEW  COMPARISON:  Chest x-ray 11/07/2014.  FINDINGS: Low lung volumes. Improving aeration throughout the lungs bilaterally with resolving multifocal airspace disease. Worsening linear opacity in the right mid lung, and in the left lung base, likely reflective of areas of increasing subsegmental atelectasis. No pleural effusions. No evidence of pulmonary edema. Heart size is normal. Mediastinal contours are unremarkable.  IMPRESSION: 1. Improving multifocal airspace disease compatible with resolving multilobar pneumonia. New areas of subsegmental atelectasis are noted throughout the lungs bilaterally, as above, in the setting of low lung volumes.   Electronically Signed   By: Vinnie Langton M.D.   On: 02/14/2015 09:17   Dg Chest Port 1 View  02/12/2015   CLINICAL DATA:  Health care associated pneumonia, CHF, acute respiratory failure with hypoxia.  EXAM: PORTABLE CHEST - 1 VIEW  COMPARISON:  Portable chest x-ray of February 11, 2015  FINDINGS: The lungs are mildly hypoinflated. The interstitial markings have improved and the confluent alveolar opacities bilaterally are less  conspicuous. The left hemidiaphragm is better demonstrated. There is no significant pleural effusion. The cardiac silhouette is normal in size. The bony thorax exhibits no acute abnormality.  IMPRESSION: Further interval improvement in bilateral pneumonia. The small left pleural effusion is less conspicuous today. When the patient can tolerate the procedure, a PA and lateral chest x-ray would be useful.   Electronically Signed   By: David  Martinique M.D.   On: 02/12/2015 07:28   Dg Chest Port 1 View  02/11/2015   CLINICAL DATA:  Acute respiratory failure with hypoxia, shortness of  breath, CHF.  EXAM: PORTABLE CHEST - 1 VIEW  COMPARISON:  Chest x-ray and chest CT scan of February 09, 2015.  FINDINGS: The lungs are mildly hypoinflated. Persistent airspace opacities are present bilaterally greatest on the right in the upper lobe. The left hemidiaphragm is less well demonstrated today. The cardiac silhouette is enlarged. The pulmonary vascularity is indistinct. There is small left pleural effusion. There is chronic widening of the right AC joint.  IMPRESSION: Bilateral pneumonia which has increased in conspicuity since yesterday's study. There is a small left pleural effusion.   Electronically Signed   By: David  Martinique M.D.   On: 02/11/2015 07:19   Dg Chest Port 1 View  02/09/2015   CLINICAL DATA:  Shortness of breath, nausea and weakness  EXAM: PORTABLE CHEST - 1 VIEW  COMPARISON:  None.  FINDINGS: The heart size and mediastinal contours are within normal limits. Lungs are hypo aerated with crowding of the bronchovascular markings. Superimposed ill-defined right basilar airspace opacity is identified. The visualized skeletal structures are unremarkable.  IMPRESSION: Low volume exam with diffuse prominence of the interstitial markings and more focal ill-defined right lower lobe airspace opacity which could indicate atelectasis although early pneumonia could appear similar. If symptoms persist, consider PA and lateral chest radiographs obtained at full inspiration when the patient is clinically able.   Electronically Signed   By: Conchita Paris M.D.   On: 02/09/2015 11:40    Oren Binet, MD  [facility] Pager:[phone]  If 7PM-7AM, please contact night-coverage [email or web address] Password [facility] 02/14/2015, 9:55 AM   LOS: 5 days

## 2015-02-14 NOTE — Progress Notes (Signed)
Patient taken off of high flow nasal cannula and placed on 6L nasal cannula.  Currently tolerating well.  Will continue to monitor.

## 2015-02-14 NOTE — Progress Notes (Signed)
James Byrd   DOB:September 18, 1965   [id number]   [id number]  Subjective: Patient feels better today, dyspnea has improved, he is currently on 5 L nasal cannula and tolerating well. Afebrile.   Objective:  Filed Vitals:   02/14/15 1440  BP:   Pulse: 71  Temp:   Resp: 19    Body mass index is 32.79 kg/(m^2).  Intake/Output Summary (Last 24 hours) at 02/14/15 1736 Last data filed at 02/14/15 1530  Gross per 24 hour  Intake    540 ml  Output   2850 ml  Net  -2310 ml     Sclerae unicteric  Oropharynx clear  No peripheral adenopathy  Lungs clear -- no rales or rhonchi  Heart regular rate and rhythm  Abdomen benign  MSK no focal spinal tenderness, no peripheral edema  Neuro nonfocal  CBG (last 3)   Recent Labs  02/13/15 1714 02/13/15 2207 02/14/15 0745  GLUCAP 160* 134* 143*     Labs:  Lab Results  Component Value Date   WBC 9.2 02/14/2015   HGB 11.6* 02/14/2015   HCT 32.6* 02/14/2015   MCV 88.1 02/14/2015   PLT 171 02/14/2015   NEUTROABS 4.3 02/09/2015    @LASTCHEMISTRY @  Urine Studies No results for input(s): UHGB, CRYS in the last 72 hours.  Invalid input(s): UACOL, UAPR, USPG, UPH, UTP, UGL, UKET, UBIL, UNIT, UROB, [place], UEPI, UWBC, Junie Panning [place], [place], [place]  Basic Metabolic Panel:  Recent Labs Lab 02/10/15 0630 02/11/15 0518 02/12/15 0529 02/13/15 0958 02/14/15 0835  NA 130* 130* 132* 133* 134*  K 3.2* 3.4* 3.6 4.4 4.6  CL 96* 92* 95* 98* 100*  CO2 25 25 28 23  21*  GLUCOSE 98 91 100* 169* 135*  BUN <5* 7 7 15 20   CREATININE 0.65 0.89 1.14 1.91* 2.04*  CALCIUM 8.2* 8.6* 8.8* 9.2 9.4  MG 1.7  --   --   --   --    GFR Estimated Creatinine Clearance: 56.6 mL/min (by C-G formula based on Cr of 2.04). Liver Function Tests:  Recent Labs Lab 02/09/15 1118  AST 16  ALT 41  ALKPHOS 38  BILITOT 1.2  PROT 6.1*  ALBUMIN 3.2*   No results for input(s): LIPASE, AMYLASE in the last 168 hours. No results for input(s): AMMONIA in the  last 168 hours. Coagulation profile  Recent Labs Lab 02/09/15 2127  INR 1.07    CBC:  Recent Labs Lab 02/09/15 1118 02/10/15 0630 02/11/15 0518 02/12/15 0529 02/14/15 0502  WBC 5.3 4.6 4.7 4.3 9.2  NEUTROABS 4.3  --   --   --   --   HGB 12.0* 11.3* 11.7* 11.9* 11.6*  HCT 35.2* 32.5* 33.0* 33.8* 32.6*  MCV 92.6 90.5 88.9 89.4 88.1  PLT 90* 79* 97* 116* 171   Cardiac Enzymes: No results for input(s): CKTOTAL, CKMB, CKMBINDEX, TROPONINI in the last 168 hours. BNP: Invalid input(s): POCBNP CBG:  Recent Labs Lab 02/13/15 0805 02/13/15 1148 02/13/15 1714 02/13/15 2207 02/14/15 0745  GLUCAP 181* 203* 160* 134* 143*   D-Dimer No results for input(s): DDIMER in the last 72 hours. Hgb A1c No results for input(s): HGBA1C in the last 72 hours. Lipid Profile No results for input(s): CHOL, HDL, LDLCALC, TRIG, CHOLHDL, LDLDIRECT in the last 72 hours. Thyroid function studies No results for input(s): TSH, T4TOTAL, T3FREE, THYROIDAB in the last 72 hours.  Invalid input(s): FREET3 Anemia work up No results for input(s): VITAMINB12, FOLATE, FERRITIN, TIBC, IRON, RETICCTPCT in the last  72 hours. Microbiology Recent Results (from the past 240 hour(s))  Culture, blood (routine x 2)     Status: None   Collection Time: 02/09/15 11:11 AM  Result Value Ref Range Status   Specimen Description BLOOD RIGHT FOREARM  Final   Special Requests BOTTLES DRAWN AEROBIC AND ANAEROBIC 5CC  Final   Culture   Final    NO GROWTH 5 DAYS Performed at [facility]    Report Status 02/14/2015 FINAL  Final  Culture, blood (routine x 2)     Status: None   Collection Time: 02/09/15 11:23 AM  Result Value Ref Range Status   Specimen Description BLOOD RIGHT HAND  Final   Special Requests BOTTLES DRAWN AEROBIC AND ANAEROBIC 4.5CC EACH  Final   Culture   Final    NO GROWTH 5 DAYS Performed at [facility]    Report Status 02/14/2015 FINAL  Final  Culture, expectorated  sputum-assessment     Status: None   Collection Time: 02/12/15  7:36 AM  Result Value Ref Range Status   Specimen Description EXPECTORATED SPUTUM  Final   Special Requests NONE  Final   Sputum evaluation   Final    MICROSCOPIC FINDINGS SUGGEST THAT THIS SPECIMEN IS NOT REPRESENTATIVE OF LOWER RESPIRATORY SECRETIONS. PLEASE RECOLLECT. Results Called to: Olin Pia AT [id number] 02/12/15 BY L BENFIELD    Report Status 02/12/2015 FINAL  Final      Studies:  Dg Chest Port 1 View  02/14/2015   CLINICAL DATA:  49 year old male with history of healthcare associated pneumonia.  EXAM: PORTABLE CHEST - 1 VIEW  COMPARISON:  Chest x-ray 11/07/2014.  FINDINGS: Low lung volumes. Improving aeration throughout the lungs bilaterally with resolving multifocal airspace disease. Worsening linear opacity in the right mid lung, and in the left lung base, likely reflective of areas of increasing subsegmental atelectasis. No pleural effusions. No evidence of pulmonary edema. Heart size is normal. Mediastinal contours are unremarkable.  IMPRESSION: 1. Improving multifocal airspace disease compatible with resolving multilobar pneumonia. New areas of subsegmental atelectasis are noted throughout the lungs bilaterally, as above, in the setting of low lung volumes.   Electronically Signed   By: Vinnie Langton M.D.   On: 02/14/2015 09:17    Assessment: 49 y.o.   1. HCAP vs ipilimumab induced pneumonitis 2. Acute hypoxic respiratory failure secondary to #1 3. Mild acute renal failure 4. History of CHF 5. History of stage III melanoma 6. Thrombocytopenia, resolved today, likely related to infection   Plan:  -Continue high-dose solu-medro today and tomorrow, likely tapering down, Dr. Marin Olp will return on Monday and follow up  -Agree with antibiotics, oxygen and supportive care   Truitt Merle, MD 02/14/2015  5:36 PM

## 2015-02-14 NOTE — Progress Notes (Addendum)
PCCM PROGRESS NOTE  ADMISSION DATE: 02/09/2015 CONSULT DATE: 02/10/2015 REFERRING PROVIDER: [facility]  CC: Short of breath  SUBJECTIVE: Much improved. Less cough and dyspnea, off high flow oxygen   OBJECTIVE: Temp:  [97.6 F (36.4 C)-98.5 F (36.9 C)] 97.6 F (36.4 C) (08/27 [id number]) Pulse Rate:  [74-88] 83 (08/27 0822) Resp:  [17-22] 20 (08/27 0822) BP: (109-121)/(74-77) 121/74 mmHg (08/27 0613) SpO2:  [93 %-96 %] 94 % (08/27 0822) FiO2 (%):  [50 %-75 %] 50 % (08/27 0730) Weight:  [109.7 kg (241 lb 13.5 oz)] 109.7 kg (241 lb 13.5 oz) (08/27 0604)   General: wearing 6L [place]  sitting in chair HEENT: no sinus tenderness Cardiac: regular Chest: no wheeze, clearer Abd: soft, non tender Ext: no edema Neuro: normal strength Skin: no rashes   CMP Latest Ref Rng 02/14/2015 02/13/2015 02/12/2015  Glucose 65 - 99 mg/dL 135(H) 169(H) 100(H)  BUN 6 - 20 mg/dL 20 15 7   Creatinine 0.61 - 1.24 mg/dL 2.04(H) 1.91(H) 1.14  Sodium 135 - 145 mmol/L 134(L) 133(L) 132(L)  Potassium 3.5 - 5.1 mmol/L 4.6 4.4 3.6  Chloride 101 - 111 mmol/L 100(L) 98(L) 95(L)  CO2 22 - 32 mmol/L 21(L) 23 28  Calcium 8.9 - 10.3 mg/dL 9.4 9.2 8.8(L)  Total Protein 6.5 - 8.1 g/dL - - -  Albumin 3.3 - 5.5 g/dL - - -  Total Bilirubin 0.3 - 1.2 mg/dL - - -  Alkaline Phos 38 - 126 U/L - - -  AST 15 - 41 U/L - - -  ALT 17 - 63 U/L - - -    CBC Latest Ref Rng 02/14/2015 02/12/2015 02/11/2015  WBC 4.0 - 10.5 K/uL 9.2 4.3 4.7  Hemoglobin 13.0 - 17.0 g/dL 11.6(L) 11.9(L) 11.7(L)  Hematocrit 39.0 - 52.0 % 32.6(L) 33.8(L) 33.0(L)  Platelets 150 - 400 K/uL 171 116(L) 97(L)    Lab Results  Component Value Date   ESRSEDRATE 82* 02/10/2015    BNP    Component Value Date/Time   BNP 9.3 02/11/2015 0830    Dg Chest Port 1 View  02/14/2015   CLINICAL DATA:  49 year old male with history of healthcare associated pneumonia.  EXAM: PORTABLE CHEST - 1 VIEW  COMPARISON:  Chest x-ray 11/07/2014.  FINDINGS: Low lung volumes. Improving  aeration throughout the lungs bilaterally with resolving multifocal airspace disease. Worsening linear opacity in the right mid lung, and in the left lung base, likely reflective of areas of increasing subsegmental atelectasis. No pleural effusions. No evidence of pulmonary edema. Heart size is normal. Mediastinal contours are unremarkable.  IMPRESSION: 1. Improving multifocal airspace disease compatible with resolving multilobar pneumonia. New areas of subsegmental atelectasis are noted throughout the lungs bilaterally, as above, in the setting of low lung volumes.   Electronically Signed   By: Vinnie Langton M.D.   On: 02/14/2015 09:17     CULTURES: 8/22 Blood >>NO growth 8/22 Legionella Ag >> negative 8/22 Pneumococcal Ag >> negative 8/23 Influenza PCR >> negative  ANTIBIOTICS: 8/22 Fortaz >> 8/22 8/22 Zosyn >> 8/26 8/22 Vancomycin >> 8/26 8/24 Zithromax >> 8/26 8/26 Levaquin >>  STUDIES: 8/22 CT chest >> b/l ASD Rt > Lt 8/24 Echo >> mod LVH, EF 65 to [id number], grade 1 diastolic dysfx  EVENTS: [id number] Admit 8/24 Oncology consulted 8/25  high dose solumedrol per oncology started   DISCUSSION: 49 yo male presented with weakness, fever, nausea, cough, dyspnea, hypoxia from b/l pneumonia.  He has hx of Stage IIIB ([id number]) melanoma of Rt  toe s/p chemotherapy with Curt Bears (has 5% reported incidence of pneumonitis) >> last on 01/26/15. Markedly better AM 8/27  ASSESSMENT/PLAN: Principal Problem:   Drug-induced pneumonitis - VERVOY Active Problems:   Paroxysmal supraventricular tachycardia   Anxiety   Obstructive sleep apnea   Malignant melanoma of right great toe Stage IIIB [id number]   Hypokalemia   HLD (hyperlipidemia)   Chronic diastolic congestive heart failure   Thrombocytopenia   Acute respiratory failure with hypoxia   Acute hypoxic respiratory failure 2nd to Sepsis from HCAP (?viral) with small Lt pleural effusion in setting of melanoma s/p chemotherapy.  Less likely  pulmonary infiltrates are related to chemotherapy induced pneumonitis. Plan: - oxygen to keep SpO2 90 to 95% - titrated to 6L [place] AM 8/27 - day 6 of Abx >> levaquin - solumedrol ordered 8/25 by oncology >> plan stop date is 8/28 -change oxygen spo2 checks to prn /per VS  Dysphagia related to respiratory distress. Plan: - diet advanced - f/u with speech therapy   D/w Dr. Sloan Leiter.   Mariel Sleet [place]  [phone]  Cell  [phone]  If no response or cell goes to voicemail, call beeper [phone]  02/14/2015, 11:38 AM After 3pm call: [phone]

## 2015-02-15 LAB — CBC WITH DIFFERENTIAL/PLATELET
BASOS ABS: 0 10*3/uL (ref 0.0–0.1)
BASOS PCT: 0 % (ref 0–1)
EOS PCT: 0 % (ref 0–5)
Eosinophils Absolute: 0 10*3/uL (ref 0.0–0.7)
HCT: 29.1 % — ABNORMAL LOW (ref 39.0–52.0)
Hemoglobin: 10.2 g/dL — ABNORMAL LOW (ref 13.0–17.0)
LYMPHS PCT: 11 % — AB (ref 12–46)
Lymphs Abs: 1.1 10*3/uL (ref 0.7–4.0)
MCH: 31.7 pg (ref 26.0–34.0)
MCHC: 35.1 g/dL (ref 30.0–36.0)
MCV: 90.4 fL (ref 78.0–100.0)
Monocytes Absolute: 0.6 10*3/uL (ref 0.1–1.0)
Monocytes Relative: 7 % (ref 3–12)
NEUTROS ABS: 7.8 10*3/uL — AB (ref 1.7–7.7)
Neutrophils Relative %: 82 % — ABNORMAL HIGH (ref 43–77)
PLATELETS: 150 10*3/uL (ref 150–400)
RBC: 3.22 MIL/uL — AB (ref 4.22–5.81)
RDW: 15.6 % — AB (ref 11.5–15.5)
WBC: 9.5 10*3/uL (ref 4.0–10.5)

## 2015-02-15 LAB — BASIC METABOLIC PANEL
ANION GAP: 11 (ref 5–15)
BUN: 25 mg/dL — AB (ref 6–20)
CALCIUM: 9.1 mg/dL (ref 8.9–10.3)
CO2: 23 mmol/L (ref 22–32)
Chloride: 103 mmol/L (ref 101–111)
Creatinine, Ser: 2.02 mg/dL — ABNORMAL HIGH (ref 0.61–1.24)
GFR calc Af Amer: 43 mL/min — ABNORMAL LOW (ref >60)
GFR, EST NON AFRICAN AMERICAN: 37 mL/min — AB (ref >60)
Glucose, Bld: 104 mg/dL — ABNORMAL HIGH (ref 65–99)
POTASSIUM: 4.6 mmol/L (ref 3.5–5.1)
SODIUM: 137 mmol/L (ref 135–145)

## 2015-02-15 LAB — GLUCOSE, CAPILLARY
GLUCOSE-CAPILLARY: 115 mg/dL — AB (ref 65–99)
GLUCOSE-CAPILLARY: 221 mg/dL — AB (ref 65–99)
GLUCOSE-CAPILLARY: 190 mg/dL — AB (ref 65–99)
Glucose-Capillary: 94 mg/dL (ref 65–99)

## 2015-02-15 NOTE — Progress Notes (Signed)
PCCM PROGRESS NOTE  ADMISSION DATE: 02/09/2015 CONSULT DATE: 02/10/2015 REFERRING PROVIDER: [facility]  CC: Short of breath  SUBJECTIVE: Much better, on less oxygen  OBJECTIVE: Temp:  [97.4 F (36.3 C)-98.4 F (36.9 C)] 97.5 F (36.4 C) (08/28 0534) Pulse Rate:  [71-80] 80 (08/28 0534) Resp:  [19-20] 20 (08/28 0534) BP: (109-125)/(76-85) 125/79 mmHg (08/28 0534) SpO2:  [95 %-97 %] 97 % (08/28 0534) Weight:  [109.72 kg (241 lb 14.2 oz)] 109.72 kg (241 lb 14.2 oz) (08/28 0534)   General: wearing 5 L [place]   HEENT: no sinus tenderness Cardiac: regular Chest: no wheeze, clearer Abd: soft, non tender Ext: no edema Neuro: normal strength Skin: no rashes   CMP Latest Ref Rng 02/15/2015 02/14/2015 02/13/2015  Glucose 65 - 99 mg/dL 104(H) 135(H) 169(H)  BUN 6 - 20 mg/dL 25(H) 20 15  Creatinine 0.61 - 1.24 mg/dL 2.02(H) 2.04(H) 1.91(H)  Sodium 135 - 145 mmol/L 137 134(L) 133(L)  Potassium 3.5 - 5.1 mmol/L 4.6 4.6 4.4  Chloride 101 - 111 mmol/L 103 100(L) 98(L)  CO2 22 - 32 mmol/L 23 21(L) 23  Calcium 8.9 - 10.3 mg/dL 9.1 9.4 9.2  Total Protein 6.5 - 8.1 g/dL - - -  Albumin 3.3 - 5.5 g/dL - - -  Total Bilirubin 0.3 - 1.2 mg/dL - - -  Alkaline Phos 38 - 126 U/L - - -  AST 15 - 41 U/L - - -  ALT 17 - 63 U/L - - -    CBC Latest Ref Rng 02/15/2015 02/14/2015 02/12/2015  WBC 4.0 - 10.5 K/uL 9.5 9.2 4.3  Hemoglobin 13.0 - 17.0 g/dL 10.2(L) 11.6(L) 11.9(L)  Hematocrit 39.0 - 52.0 % 29.1(L) 32.6(L) 33.8(L)  Platelets 150 - 400 K/uL 150 171 116(L)    Lab Results  Component Value Date   ESRSEDRATE 82* 02/10/2015    BNP    Component Value Date/Time   BNP 9.3 02/11/2015 0830    Dg Chest Port 1 View  02/14/2015   CLINICAL DATA:  49 year old male with history of healthcare associated pneumonia.  EXAM: PORTABLE CHEST - 1 VIEW  COMPARISON:  Chest x-ray 11/07/2014.  FINDINGS: Low lung volumes. Improving aeration throughout the lungs bilaterally with resolving multifocal airspace disease.  Worsening linear opacity in the right mid lung, and in the left lung base, likely reflective of areas of increasing subsegmental atelectasis. No pleural effusions. No evidence of pulmonary edema. Heart size is normal. Mediastinal contours are unremarkable.  IMPRESSION: 1. Improving multifocal airspace disease compatible with resolving multilobar pneumonia. New areas of subsegmental atelectasis are noted throughout the lungs bilaterally, as above, in the setting of low lung volumes.   Electronically Signed   By: Vinnie Langton M.D.   On: 02/14/2015 09:17     CULTURES: 8/22 Blood >>NO growth 8/22 Legionella Ag >> negative 8/22 Pneumococcal Ag >> negative 8/23 Influenza PCR >> negative  ANTIBIOTICS: 8/22 Fortaz >> 8/22 8/22 Zosyn >> 8/26 8/22 Vancomycin >> 8/26 8/24 Zithromax >> 8/26 8/26 Levaquin >>  STUDIES: 8/22 CT chest >> b/l ASD Rt > Lt 8/24 Echo >> mod LVH, EF 65 to [id number], grade 1 diastolic dysfx  EVENTS: [id number] Admit 8/24 Oncology consulted 8/25  high dose solumedrol per oncology started   DISCUSSION: 49 yo male presented with weakness, fever, nausea, cough, dyspnea, hypoxia from b/l pneumonia.  He has hx of Stage IIIB [id number]) melanoma of Rt toe s/p chemotherapy with Curt Bears (has 5% reported incidence of pneumonitis) >> last on 01/26/15. Markedly better  AM 8/28  ASSESSMENT/PLAN: Principal Problem:   Drug-induced pneumonitis - VERVOY Active Problems:   Paroxysmal supraventricular tachycardia   Anxiety   Obstructive sleep apnea   Malignant melanoma of right great toe Stage IIIB [id number])   Hypokalemia   HLD (hyperlipidemia)   Chronic diastolic congestive heart failure   Thrombocytopenia   Acute respiratory failure with hypoxia   Acute hypoxic respiratory failure 2nd to Sepsis from HCAP (?viral) with small Lt pleural effusion in setting of melanoma s/p chemotherapy.  Less likely pulmonary infiltrates are related to chemotherapy induced pneumonitis. Plan: - oxygen to  keep SpO2 90 to 95% - titrated to 5L [place] AM 8/28 - day 7 of Abx >> levaquin - solumedrol ordered 8/25 by oncology >> plan stop date is 8/28 -likely will need home oxygen  Dysphagia related to respiratory distress. Plan: - diet advanced - f/u with speech therapy   [place]  [phone]  Cell  [phone]  If no response or cell goes to voicemail, call beeper [phone]  02/15/2015, 11:13 AM After 3pm call: [phone]

## 2015-02-15 NOTE — Progress Notes (Signed)
PATIENT DETAILS Name: James Byrd Age: 49 y.o. Sex: male Date of Birth: 13-Feb-1966 Admit Date: 02/09/2015 Admitting Physician Ivor Costa, MD [id number], Herbie [place], MD  Subjective: Much improved.  Assessment/Plan: Principal Problem: Acute hypoxic respiratory failure: Secondary to presumed HCAP vs pneumonitis from Ipilimumab. Much improved. Continue antibiotics (see below), now off high flow oxygen and on 5 L of O2 via nasal cannula.FIO2 being titrated down.  Active Problems: HCAP: Much improved, requiring significantly less oxygen. Was on empiric vancomycin, Zosyn and Zithromax-now narrowed to Levaquin-continue for 1-2 more days.On Ipilimumab-for metastatic melanoma-which apparently can cause pneumonitis.Seen by Oncology-started on IV Solumedrol for 4 doses-starting 8/25. Influenza PCR neg, HIV neg. Follow clincal course. PCCM following.   Dysphagia: Suspect more from respiratory distress, SLP evaluation completed-advanced to regular diet  Mild ARF: Euvolemic on exam, ?etiology-UA negative, no indication of post obstructive uroppathy-creatinine seems to be levelling off-follow.  History of paroxysmal SVT: Continue Cardizem.  History of anxiety: Continue Lexapro  History of dyslipidemia: Continue statin  History of chronic diastolic heart failure: Clinically compensated. Follow volume status closely.  Hypokalemia: Repleted  Thrombocytopenia: Resolved, could be secondary to chemotherapy. Follow.  History of metastatic melanoma: Primary oncologist Dr. Syliva Overman  Disposition: Remain inpatient-home in the next 2-3 days if clinical improvement continues  Antimicrobial agents  See below  Anti-infectives    Start     Dose/Rate Route Frequency Ordered Stop   02/13/15 1800  levofloxacin (LEVAQUIN) IVPB 750 mg     750 mg 100 mL/hr over 90 Minutes Intravenous Every 24 hours 02/13/15 1407     02/11/15 1800  azithromycin (ZITHROMAX) 500 mg in dextrose 5 % 250  mL IVPB  Status:  Discontinued     500 mg 250 mL/hr over 60 Minutes Intravenous Every 24 hours 02/11/15 1745 02/13/15 1257   02/10/15 0000  azithromycin (ZITHROMAX) 500 mg in dextrose 5 % 250 mL IVPB  Status:  Discontinued     500 mg 250 mL/hr over 60 Minutes Intravenous Every 24 hours 02/09/15 1452 02/09/15 1513   02/10/15 0000  vancomycin (VANCOCIN) IVPB 1000 mg/200 mL premix  Status:  Discontinued     1,000 mg 200 mL/hr over 60 Minutes Intravenous Every 8 hours 02/09/15 1529 02/09/15 2010   02/09/15 2030  vancomycin (VANCOCIN) IVPB 1000 mg/200 mL premix  Status:  Discontinued     1,000 mg 200 mL/hr over 60 Minutes Intravenous Every 8 hours 02/09/15 2010 02/13/15 1257   02/09/15 2030  piperacillin-tazobactam (ZOSYN) IVPB 3.375 g  Status:  Discontinued     3.375 g 12.5 mL/hr over 240 Minutes Intravenous 3 times per day 02/09/15 2015 02/13/15 1257   02/09/15 1630  vancomycin (VANCOCIN) IVPB 1000 mg/200 mL premix  Status:  Discontinued     1,000 mg 200 mL/hr over 60 Minutes Intravenous  Once 02/09/15 1516 02/09/15 2010   02/09/15 1530  vancomycin (VANCOCIN) IVPB 1000 mg/200 mL premix     1,000 mg 200 mL/hr over 60 Minutes Intravenous  Once 02/09/15 1516 02/09/15 1653   02/09/15 1530  cefTAZidime (FORTAZ) 2 g in dextrose 5 % 50 mL IVPB  Status:  Discontinued     2 g 100 mL/hr over 30 Minutes Intravenous Every 8 hours 02/09/15 1524 02/09/15 1959   02/09/15 1520  cefTAZidime (FORTAZ) 1 G injection    Comments:  Collene Gobble   : cabinet override      02/09/15 1520 02/09/15 1651  02/09/15 1500  cefTAZidime (FORTAZ) 2 g in dextrose 5 % 50 mL IVPB  Status:  Discontinued     2 g 100 mL/hr over 30 Minutes Intravenous 3 times per day 02/09/15 1452 02/09/15 1513   02/09/15 1500  cefTAZidime (FORTAZ) 2 g in dextrose 5 % 50 mL IVPB  Status:  Discontinued     2 g 100 mL/hr over 30 Minutes Intravenous 3 times per day 02/09/15 1457 02/09/15 1524   02/09/15 1448  cefTRIAXone (ROCEPHIN) 1 G  injection  Status:  Discontinued    Comments:  Collene Gobble   : cabinet override      02/09/15 1448 02/09/15 1528   02/09/15 1445  azithromycin (ZITHROMAX) 500 MG injection  Status:  Discontinued    Comments:  Collene Gobble   : cabinet override      02/09/15 1445 02/09/15 1528   02/09/15 1430  cefTRIAXone (ROCEPHIN) 1 g in dextrose 5 % 50 mL IVPB  Status:  Discontinued     1 g 100 mL/hr over 30 Minutes Intravenous  Once 02/09/15 1424 02/09/15 1513   02/09/15 1430  azithromycin (ZITHROMAX) 500 mg in dextrose 5 % 250 mL IVPB  Status:  Discontinued     500 mg 250 mL/hr over 60 Minutes Intravenous  Once 02/09/15 1424 02/09/15 1513      DVT Prophylaxis: Prophylactic Lovenox -but watch platelets  Code Status: Full code  Family Communication Spouse at bedside  Procedures: None  CONSULTS:  pulmonary/intensive care and hematology/oncology  Time spent 25 minutes-Greater than 50% of this time was spent in counseling, explanation of diagnosis, planning of further management, and coordination of care.  MEDICATIONS: Scheduled Meds: . aspirin  81 mg Oral Daily  . diltiazem  120 mg Oral Daily  . enoxaparin (LOVENOX) injection  40 mg Subcutaneous Q24H  . escitalopram  10 mg Oral Daily  . guaiFENesin  1,200 mg Oral BID  . insulin aspart  0-15 Units Subcutaneous TID WC  . levofloxacin (LEVAQUIN) IV  750 mg Intravenous Q24H  . methylPREDNISolone (SOLU-MEDROL) injection  200 mg Intravenous Daily  . pravastatin  40 mg Oral QHS  . sodium chloride  3 mL Intravenous Q12H   Continuous Infusions: . sodium chloride 10 mL/hr (02/10/15 1531)  . sodium chloride 75 mL/hr at 02/15/15 0551   PRN Meds:.acetaminophen **OR** acetaminophen, albuterol, alum & mag hydroxide-simeth, lidocaine-prilocaine, ondansetron **OR** ondansetron (ZOFRAN) IV    PHYSICAL EXAM: Vital signs in last 24 hours: Filed Vitals:   02/14/15 1436 02/14/15 1440 02/14/15 2055 02/15/15 0534  BP: 109/76  123/85 125/79    Pulse: 71 71 78 80  Temp: 98.4 F (36.9 C)  97.4 F (36.3 C) 97.5 F (36.4 C)  TempSrc: Oral  Oral Oral  Resp: 20 19 20 20   Height:      Weight:    109.72 kg (241 lb 14.2 oz)  SpO2: 96% 95% 96% 97%    Weight change: 0.02 kg (0.7 oz) Filed Weights   02/13/15 0544 02/14/15 0604 02/15/15 0534  Weight: 115.939 kg (255 lb 9.6 oz) 109.7 kg (241 lb 13.5 oz) 109.72 kg (241 lb 14.2 oz)   Body mass index is 32.8 kg/(m^2).   Gen Exam: Awake and alert with clear speech.   Neck: Supple, No JVD.   Chest: Few bibasilar rales but mostly clear today CVS: S1 S2 Regular, no murmurs.  Abdomen: soft, BS +, non tender, non distended.  Extremities: no edema, lower extremities warm to touch Neurologic: Non Focal.  Skin: No Rash.   Wounds: N/A.    Intake/Output from previous day:  Intake/Output Summary (Last 24 hours) at 02/15/15 0951 Last data filed at 02/15/15 [id number]  Gross per 24 hour  Intake   1040 ml  Output   3450 ml  Net  -2410 ml     LAB RESULTS: CBC  Recent Labs Lab 02/09/15 1118 02/10/15 0630 02/11/15 0518 02/12/15 0529 02/14/15 0502 02/15/15 0521  WBC 5.3 4.6 4.7 4.3 9.2 9.5  HGB 12.0* 11.3* 11.7* 11.9* 11.6* 10.2*  HCT 35.2* 32.5* 33.0* 33.8* 32.6* 29.1*  PLT 90* 79* 97* 116* 171 150  MCV 92.6 90.5 88.9 89.4 88.1 90.4  MCH 31.6 31.5 31.5 31.5 31.4 31.7  MCHC 34.1 34.8 35.5 35.2 35.6 35.1  RDW 16.7* 16.5* 16.1* 15.9* 15.6* 15.6*  LYMPHSABS 0.8  --   --   --   --  1.1  MONOABS 0.2  --   --   --   --  0.6  EOSABS 0.0  --   --   --   --  0.0  BASOSABS 0.0  --   --   --   --  0.0    Chemistries   Recent Labs Lab 02/10/15 0630 02/11/15 0518 02/12/15 0529 02/13/15 0958 02/14/15 0835 02/15/15 0521  NA 130* 130* 132* 133* 134* 137  K 3.2* 3.4* 3.6 4.4 4.6 4.6  CL 96* 92* 95* 98* 100* 103  CO2 25 25 28 23  21* 23  GLUCOSE 98 91 100* 169* 135* 104*  BUN <5* 7 7 15 20  25*  CREATININE 0.65 0.89 1.14 1.91* 2.04* 2.02*  CALCIUM 8.2* 8.6* 8.8* 9.2 9.4 9.1  MG 1.7   --   --   --   --   --     CBG:  Recent Labs Lab 02/13/15 2207 02/14/15 0745 02/14/15 1142 02/14/15 1627 02/14/15 2205  GLUCAP 134* 143* 187* 143* 126*    GFR Estimated Creatinine Clearance: 57.2 mL/min (by C-G formula based on Cr of 2.02).  Coagulation profile  Recent Labs Lab 02/09/15 2127  INR 1.07    Cardiac Enzymes No results for input(s): CKMB, TROPONINI, MYOGLOBIN in the last 168 hours.  Invalid input(s): CK  Invalid input(s): POCBNP No results for input(s): DDIMER in the last 72 hours. No results for input(s): HGBA1C in the last 72 hours. No results for input(s): CHOL, HDL, LDLCALC, TRIG, CHOLHDL, LDLDIRECT in the last 72 hours. No results for input(s): TSH, T4TOTAL, T3FREE, THYROIDAB in the last 72 hours.  Invalid input(s): FREET3 No results for input(s): VITAMINB12, FOLATE, FERRITIN, TIBC, IRON, RETICCTPCT in the last 72 hours. No results for input(s): LIPASE, AMYLASE in the last 72 hours.  Urine Studies No results for input(s): UHGB, CRYS in the last 72 hours.  Invalid input(s): UACOL, UAPR, USPG, UPH, UTP, UGL, UKET, UBIL, UNIT, UROB, ULEU, UEPI, UWBC, URBC, UBAC, CAST, UCOM, BILUA  MICROBIOLOGY: Recent Results (from the past 240 hour(s))  Culture, blood (routine x 2)     Status: None   Collection Time: 02/09/15 11:11 AM  Result Value Ref Range Status   Specimen Description BLOOD RIGHT FOREARM  Final   Special Requests BOTTLES DRAWN AEROBIC AND ANAEROBIC 5CC  Final   Culture   Final    NO GROWTH 5 DAYS Performed at [facility]    Report Status 02/14/2015 FINAL  Final  Culture, blood (routine x 2)     Status: None   Collection Time: 02/09/15 11:23 AM  Result Value Ref Range Status   Specimen Description BLOOD RIGHT HAND  Final   Special Requests BOTTLES DRAWN AEROBIC AND ANAEROBIC 4.5CC EACH  Final   Culture   Final    NO GROWTH 5 DAYS Performed at [facility]    Report Status 02/14/2015 FINAL  Final  Culture,  expectorated sputum-assessment     Status: None   Collection Time: 02/12/15  7:36 AM  Result Value Ref Range Status   Specimen Description EXPECTORATED SPUTUM  Final   Special Requests NONE  Final   Sputum evaluation   Final    MICROSCOPIC FINDINGS SUGGEST THAT THIS SPECIMEN IS NOT REPRESENTATIVE OF LOWER RESPIRATORY SECRETIONS. PLEASE RECOLLECT. Results Called to: Olin Pia AT [id number] 02/12/15 BY L BENFIELD    Report Status 02/12/2015 FINAL  Final    RADIOLOGY STUDIES/RESULTS: Dg Chest 2 View  02/09/2015   CLINICAL DATA:  Fever, weakness for 1 week  EXAM: CHEST  2 VIEW  COMPARISON:  02/09/2015  FINDINGS: Cardiomediastinal silhouette is stable. Persistent streaky infiltrate/ pneumonia in right lower lobe. No pulmonary edema. Bony thorax is unremarkable.  IMPRESSION: Persistent streaky infiltrate/ pneumonia right lower lobe. No pulmonary edema.   Electronically Signed   By: Lahoma Crocker M.D.   On: 02/09/2015 13:06   Ct Angio Chest Pe W/cm &/or Wo Cm  02/09/2015   CLINICAL DATA:  Hypoxia. Nausea, weakness, shortness of breath since chemotherapy 2 weeks ago.  EXAM: CT ANGIOGRAPHY CHEST WITH CONTRAST  TECHNIQUE: Multidetector CT imaging of the chest was performed using the standard protocol during bolus administration of intravenous contrast. Multiplanar CT image reconstructions and MIPs were obtained to evaluate the vascular anatomy.  CONTRAST:  167mL OMNIPAQUE IOHEXOL 350 MG/ML SOLN  COMPARISON:  Chest x-ray earlier today.  FINDINGS: Heart is normal size. Aorta is normal caliber. No mediastinal, hilar, or axillary adenopathy. No filling defects in the pulmonary arteries to suggest pulmonary emboli. There are bilateral airspace opacities, most confluent in the lower lobes, right greater than left, but also present in the upper lobes with ground-glass opacities present. Findings most compatible with multifocal pneumonia. No pleural effusions.  Chest wall soft tissues are unremarkable. Imaging into the  upper abdomen shows no acute findings.  No acute bony abnormality or focal bone lesion.  Review of the MIP images confirms the above findings.  IMPRESSION: No evidence of pulmonary embolus.  Bilateral airspace opacities, most confluent in the lower lobes but also noted in the upper lobes concerning for multifocal pneumonia.   Electronically Signed   By: Rolm Baptise M.D.   On: 02/09/2015 14:05   Dg Chest Port 1 View  02/14/2015   CLINICAL DATA:  49 year old male with history of healthcare associated pneumonia.  EXAM: PORTABLE CHEST - 1 VIEW  COMPARISON:  Chest x-ray 11/07/2014.  FINDINGS: Low lung volumes. Improving aeration throughout the lungs bilaterally with resolving multifocal airspace disease. Worsening linear opacity in the right mid lung, and in the left lung base, likely reflective of areas of increasing subsegmental atelectasis. No pleural effusions. No evidence of pulmonary edema. Heart size is normal. Mediastinal contours are unremarkable.  IMPRESSION: 1. Improving multifocal airspace disease compatible with resolving multilobar pneumonia. New areas of subsegmental atelectasis are noted throughout the lungs bilaterally, as above, in the setting of low lung volumes.   Electronically Signed   By: Vinnie Langton M.D.   On: 02/14/2015 09:17   Dg Chest Port 1 View  02/12/2015   CLINICAL DATA:  Health care associated pneumonia,  CHF, acute respiratory failure with hypoxia.  EXAM: PORTABLE CHEST - 1 VIEW  COMPARISON:  Portable chest x-ray of February 11, 2015  FINDINGS: The lungs are mildly hypoinflated. The interstitial markings have improved and the confluent alveolar opacities bilaterally are less conspicuous. The left hemidiaphragm is better demonstrated. There is no significant pleural effusion. The cardiac silhouette is normal in size. The bony thorax exhibits no acute abnormality.  IMPRESSION: Further interval improvement in bilateral pneumonia. The small left pleural effusion is less conspicuous  today. When the patient can tolerate the procedure, a PA and lateral chest x-ray would be useful.   Electronically Signed   By: David  Martinique M.D.   On: 02/12/2015 07:28   Dg Chest Port 1 View  02/11/2015   CLINICAL DATA:  Acute respiratory failure with hypoxia, shortness of breath, CHF.  EXAM: PORTABLE CHEST - 1 VIEW  COMPARISON:  Chest x-ray and chest CT scan of February 09, 2015.  FINDINGS: The lungs are mildly hypoinflated. Persistent airspace opacities are present bilaterally greatest on the right in the upper lobe. The left hemidiaphragm is less well demonstrated today. The cardiac silhouette is enlarged. The pulmonary vascularity is indistinct. There is small left pleural effusion. There is chronic widening of the right AC joint.  IMPRESSION: Bilateral pneumonia which has increased in conspicuity since yesterday's study. There is a small left pleural effusion.   Electronically Signed   By: David  Martinique M.D.   On: 02/11/2015 07:19   Dg Chest Port 1 View  02/09/2015   CLINICAL DATA:  Shortness of breath, nausea and weakness  EXAM: PORTABLE CHEST - 1 VIEW  COMPARISON:  None.  FINDINGS: The heart size and mediastinal contours are within normal limits. Lungs are hypo aerated with crowding of the bronchovascular markings. Superimposed ill-defined right basilar airspace opacity is identified. The visualized skeletal structures are unremarkable.  IMPRESSION: Low volume exam with diffuse prominence of the interstitial markings and more focal ill-defined right lower lobe airspace opacity which could indicate atelectasis although early pneumonia could appear similar. If symptoms persist, consider PA and lateral chest radiographs obtained at full inspiration when the patient is clinically able.   Electronically Signed   By: Conchita Paris M.D.   On: 02/09/2015 11:40    Oren Binet, MD  [facility] Pager:[phone]  If 7PM-7AM, please contact night-coverage [email or web address] Password  [facility] 02/15/2015, 9:51 AM   LOS: 6 days

## 2015-02-16 ENCOUNTER — Ambulatory Visit: Payer: BLUE CROSS/BLUE SHIELD | Admitting: Hematology & Oncology

## 2015-02-16 ENCOUNTER — Other Ambulatory Visit: Payer: BLUE CROSS/BLUE SHIELD

## 2015-02-16 ENCOUNTER — Ambulatory Visit: Payer: BLUE CROSS/BLUE SHIELD

## 2015-02-16 DIAGNOSIS — E876 Hypokalemia: Secondary | ICD-10-CM

## 2015-02-16 LAB — BASIC METABOLIC PANEL
ANION GAP: 8 (ref 5–15)
BUN: 29 mg/dL — AB (ref 6–20)
CALCIUM: 9.3 mg/dL (ref 8.9–10.3)
CO2: 23 mmol/L (ref 22–32)
Chloride: 106 mmol/L (ref 101–111)
Creatinine, Ser: 2.03 mg/dL — ABNORMAL HIGH (ref 0.61–1.24)
GFR calc Af Amer: 43 mL/min — ABNORMAL LOW (ref >60)
GFR calc non Af Amer: 37 mL/min — ABNORMAL LOW (ref >60)
GLUCOSE: 137 mg/dL — AB (ref 65–99)
Potassium: 4.5 mmol/L (ref 3.5–5.1)
Sodium: 137 mmol/L (ref 135–145)

## 2015-02-16 LAB — GLUCOSE, CAPILLARY
Glucose-Capillary: 132 mg/dL — ABNORMAL HIGH (ref 65–99)
Glucose-Capillary: 142 mg/dL — ABNORMAL HIGH (ref 65–99)

## 2015-02-16 MED ORDER — PREDNISONE 20 MG PO TABS: 60.0000 mg | ORAL_TABLET | Freq: Every day | ORAL | Status: DC

## 2015-02-16 MED ORDER — PREDNISONE 10 MG PO TABS
60.0000 mg | ORAL_TABLET | Freq: Every day | ORAL | Status: DC
  Administered 2015-02-16: 60 mg via ORAL
  Filled 2015-02-16 (×2): qty 1

## 2015-02-16 MED ORDER — LEVOFLOXACIN 750 MG PO TABS: 750.0000 mg | ORAL_TABLET | Freq: Every day | ORAL | Status: DC

## 2015-02-16 NOTE — Care Management Note (Signed)
Case Management Note  Patient Details  Name: Keir Traughber MRN: [id number] Date of Birth: August 18, 1965  Subjective/Objective:         Patient lives with spouse, per pt eval , no follow up needed. Patient states he has transportation at dc. Also has insurance. Patient chose [facility] for home oxygen, Per RN patient does not need home oxygen per ambulation sats.           Action/Plan:   Expected Discharge Date:                  Expected Discharge Plan:  Home/Self Care  In-House Referral:     Discharge planning Services  CM Consult  Post Acute Care Choice:    Choice offered to:  Patient  DME Arranged:    DME Agency:     HH Arranged:    [place] Agency:     Status of Service:  Completed, signed off  Medicare Important Message Given:    Date Medicare IM Given:    Medicare IM give by:    Date Additional Medicare IM Given:    Additional Medicare Important Message give by:     If discussed at [place] of Stay Meetings, dates discussed:    Additional Comments:  Zenon Mayo, RN 02/16/2015, 10:37 AM

## 2015-02-16 NOTE — Care Management Note (Addendum)
Case Management Note  Patient Details  Name: James Byrd MRN: [id number] Date of Birth: [birth year]  Subjective/Objective:      Patient lives with spouse, per pt eval , no follow up needed.  Patient states he has transportation at dc. Also has insurance.  Patient chose [facility] for home oxygen, Per RN patient does not need home oxygen per ambulation sats.              Action/Plan:   Expected Discharge Date:                  Expected Discharge Plan:  Home/Self Care  In-House Referral:     Discharge planning Services  CM Consult  Post Acute Care Choice:  Durable Medical Equipment Choice offered to:  Patient  DME Arranged:  Oxygen DME Agency:  [place]:    [place] Agency:     Status of Service:  Completed, signed off  Medicare Important Message Given:    Date Medicare IM Given:    Medicare IM give by:    Date Additional Medicare IM Given:    Additional Medicare Important Message give by:     If discussed at [place] of Stay Meetings, dates discussed:    Additional Comments:  Zenon Mayo, RN 02/16/2015, 10:31 AM

## 2015-02-16 NOTE — Progress Notes (Signed)
i appreciate everybody's help with Mr. Molski  In my absence. He is improving. I still have to feel that this pneumonitis is from the [place].  He is improving. His oxygen saturations are improving on less oxygen.  His chest x-ray seems to be getting better.  He completed IV Solu-Medrol. I will not put him on some oral prednisone and taper this down.  He is on IV antibiotics. I agree with switching over to oral antibiotics  If felt appropriate by the primary care team.  He is quite cushingoid.  I think all this will improve once we get him down off steroids.  He is not going to have his fourth and final cycle of adjuvant Yervoy. I just think that with all the problems he's had with the [facility], one more cycle would be very difficult for him to tolerate.  E has some chronic renal insufficiency. His creatinine is 2.03. His BUN is 29. Some of this elevation is from steroid.  He is having no problems urinating. There is no diarrhea.  His appetite is doing okay.   hhis vital signs are all stable. Temperature 97.6. Pulse 68. Blood pressure 100/76. His lungs are clear bilaterally. Cardiac exam regular rate and rhythm with no murmurs, rubs or bruits. Abdomen is soft. He is mildly obese. Bowel sounds are present. There is no palpable liver or spleen tip. Extremities shows no clubbing, cyanosis or edema. 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 Neurological exam is non focal.  His pneumonitis, in my mind, is from the [place]. I don't think we have any other way to prove that it is not.I think going to second be done would be a bronchoscopy with transbronchial biopsies. However, I would not go  that far.  For now,  We will get him on oral prednisone. I will taper him down.   Hopefully, he will be able to go home real soon.  Mikey Kirschner 3:18

## 2015-02-16 NOTE — Discharge Instructions (Signed)
Follow with Primary MD  Ann Held, MD  and other consultant as instructed your Hospitalist MD  Please get a complete blood count and chemistry panel checked by your Primary MD at your next visit, and again as instructed by your Primary MD.   If you had Pneumonia  at the Hospital: Please get a 2 view Chest X ray done in 4 weeks after hospital discharge or sooner if instructed your Hospilatist MD at the hospital.   You still have mild kidney failure at the time of discharge, please ask your primary care M.D. or your oncologist to repeat a electrolyte panel at next visit  Get Medicines reviewed and adjusted. Please take all your medications with you for your next visit with your Primary MD  Please request your Primary MD to go over all hospital tests and procedure/radiological results at the follow up, please ask your Primary MD to get all Hospital records sent to his/her office.  If you experience worsening of your admission symptoms, develop shortness of breath, life threatening emergency, suicidal or homicidal thoughts you must seek medical attention immediately by calling 911 or calling your MD immediately  if symptoms less severe.  You must read complete instructions/literature along with all the possible adverse reactions/side effects for all the Medicines you take and that have been prescribed to you. Take any new Medicines after you have completely understood and accpet all the possible adverse reactions/side effects.   Do not drive when taking Pain medications.   Do not take more than prescribed Pain, Sleep and Anxiety Medications  Special Instructions: If you have smoked or chewed Tobacco  in the last 2 yrs please stop smoking, stop any regular Alcohol  and or any Recreational drug use.  Wear Seat belts while driving.  Please note  You were cared for by a hospitalist during your hospital stay. Once you are discharged, your primary care physician will handle any further medical  issues. Please note that NO REFILLS for any discharge medications will be authorized once you are discharged, as it is imperative that you return to your primary care physician (or establish a relationship with a primary care physician if you do not have one) for your aftercare needs so that they can reassess your need for medications and monitor your lab values.

## 2015-02-16 NOTE — Progress Notes (Signed)
Physical Therapy Treatment Patient Details Name: James Byrd MRN: [id number] DOB: [birth year] Today's Date: 02/16/2015    History of Present Illness 49 yo male admitted with weakness nausea and SOB PMH CA melonoma  receiving chemothereapy.Dx with HCAP, paroxysmal, SVT, and hypokalemia.    PT Comments    Significant improvement with mobility, pt walked 200' with RW independently without LOB, SaO2 93% on RA, no dyspnea. From PT standpoint he is ready to DC home.   Follow Up Recommendations  No PT follow up     Equipment Recommendations  Rolling walker with 5" wheels    Recommendations for Other Services       Precautions / Restrictions Precautions Precautions: Fall Restrictions Weight Bearing Restrictions: No    Mobility  Bed Mobility               General bed mobility comments: NT-up in chair  Transfers Overall transfer level: Modified independent Equipment used: None   Sit to Stand: Modified independent (Device/Increase time)         General transfer comment: used armrests to push up  Ambulation/Gait Ambulation/Gait assistance: Modified independent (Device/Increase time) Ambulation Distance (Feet): 400 Feet (200' x 2) Assistive device: Rolling walker (2 wheeled) Gait Pattern/deviations: WFL(Within Functional Limits)     General Gait Details: 200' with 4L O2, Sats 96%; 200' on RA with sats at 93%, HR 107 at rest, 120 with walking, no dyspnea noted, no LOB   Stairs            Wheelchair Mobility    Modified Rankin (Stroke Patients Only)       Balance     Sitting balance-Leahy Scale: Good       Standing balance-Leahy Scale: Good                      Cognition Arousal/Alertness: Awake/alert Behavior During Therapy: WFL for tasks assessed/performed Overall Cognitive Status: Within Functional Limits for tasks assessed                      Exercises      General Comments        Pertinent Vitals/Pain Pain  Assessment: No/denies pain    Home Living                      Prior Function            PT Goals (current goals can now be found in the care plan section) Acute Rehab PT Goals Patient Stated Goal: to return to his "normal", to sit on his back deck PT Goal Formulation: With patient Time For Goal Achievement: 02/24/15 Potential to Achieve Goals: Good Progress towards PT goals: Progressing toward goals    Frequency  Min 3X/week    PT Plan Current plan remains appropriate    Co-evaluation             End of Session Equipment Utilized During Treatment:  (pt on high flow nasal cannula) Activity Tolerance: Patient tolerated treatment well (limited by length of high flow nasal cannula lines) Patient left: in chair;with call bell/phone within reach     Time: 0934-1001 PT Time Calculation (min) (ACUTE ONLY): 27 min  Charges:  $Gait Training: 23-37 mins                    G Codes:      Philomena Doheny 02/16/2015, 10:07 AM [phone]

## 2015-02-16 NOTE — Progress Notes (Signed)
SATURATION QUALIFICATIONS: (This note is used to comply with regulatory documentation for home oxygen)  Patient Saturations on Room Air at Rest = 95%  Patient Saturations on Room Air while Ambulating = 93%  Patient Saturations on NA Liters of oxygen while Ambulating = NA%  Please briefly explain why patient needs home oxygen:

## 2015-02-16 NOTE — Progress Notes (Signed)
NURSING PROGRESS NOTE  Orren Timblin [id number] Discharge Data: 02/16/2015 3:40 PM Attending Provider: Jonetta Osgood, MD [id number], Herbie Baltimore, MD   [name] to be D/C'd Home per MD order.    All IV's will be discontinued and monitored for bleeding.  All belongings will be returned to patient for patient to take home.  Last Documented Vital Signs:  Blood pressure 121/72, pulse 91, temperature 98 F (36.7 C), temperature source Oral, resp. rate 20, height 6' (1.829 m), weight 115.758 kg (255 lb 3.2 oz), SpO2 94 %.  Joslyn Hy, MSN, RN, [employer]

## 2015-02-16 NOTE — Discharge Summary (Signed)
PATIENT DETAILS Name: James Byrd Age: 49 y.o. Sex: male Date of Birth: 05-29-1966 MRN: [id number]. Admitting Physician: Ivor Costa, MD [id number], Herbie Baltimore, MD  Admit Date: 02/09/2015 Discharge date: 02/16/2015  Recommendations for Outpatient Follow-up:  1. Continue to stay on prednisone 60 mg 2 seen by oncology -Dr Marin Olp 2. Reason repeat chest x-ray in 4-6 weeks to make sure infiltrates have completely resolved 3. Has developed mild renal failure-creatinine stable around 2-please recheck electrolytes at next visit  PRIMARY DISCHARGE DIAGNOSIS:  Principal Problem:   Drug-induced pneumonitis - VERVOY Active Problems:   Paroxysmal supraventricular tachycardia   Anxiety   Obstructive sleep apnea   Malignant melanoma of right great toe Stage IIIB [id number])   Hypokalemia   HLD (hyperlipidemia)   Chronic diastolic congestive heart failure   Thrombocytopenia   Acute respiratory failure with hypoxia      PAST MEDICAL HISTORY: Past Medical History  Diagnosis Date  . Paroxysmal supraventricular tachycardia 05/24/2012    Described in the treadmill report; details are pending   . Anxiety 05/24/2012  . Malignant melanoma of right great toe 11/14/2014  . HLD (hyperlipidemia)   . Chronic diastolic congestive heart failure     DISCHARGE MEDICATIONS: Current Discharge Medication List    START taking these medications   Details  predniSONE (DELTASONE) 20 MG tablet Take 3 tablets (60 mg total) by mouth daily with breakfast. Qty: 40 tablet, Refills: 0   Associated Diagnoses: Malignant melanoma of right great toe      CONTINUE these medications which have NOT CHANGED   Details  acetaminophen (TYLENOL) 500 MG tablet Take 500 mg by mouth every 6 (six) hours as needed for mild pain.    aspirin 81 MG tablet Take 81 mg by mouth daily.   Associated Diagnoses: Malignant melanoma of great toe, right; Malignant melanoma of right great toe    diltiazem (CARDIZEM) 120 MG tablet Take 120  mg by mouth daily.   Associated Diagnoses: Malignant melanoma of great toe, right; Malignant melanoma of right great toe    escitalopram (LEXAPRO) 10 MG tablet Take 1 tablet (10 mg total) by mouth daily. Qty: 90 tablet, Refills: 90   Associated Diagnoses: Malignant melanoma of right great toe    Multiple Vitamins-Minerals (MENS ONE DAILY PO) Take 1 tablet by mouth daily.    Associated Diagnoses: Malignant melanoma of great toe, right; Malignant melanoma of right great toe    pravastatin (PRAVACHOL) 40 MG tablet Take 40 mg by mouth at bedtime.    Associated Diagnoses: Malignant melanoma of great toe, right; Malignant melanoma of right great toe    NON FORMULARY       STOP taking these medications     lidocaine-prilocaine (EMLA) cream         ALLERGIES:  No Known Allergies  BRIEF HPI:  See H&P, Labs, Consult and Test reports for all details in brief, patient is a 49 y.o. male with PMH of SVT, hyperlipidemia, anxiety, right great toe melanoma (post status of amputation, on chemotherapy), diastolic congestive heart failure (EF 60-65% with grade 2 diastolic distortion), hyperlipidemia, thrombocytopenia, who presents with generalized weakness, fever, nausea, cough and shortness breath.  CONSULTATIONS:   pulmonary/intensive care and hematology/oncology  PERTINENT RADIOLOGIC STUDIES: Dg Chest 2 View  02/09/2015   CLINICAL DATA:  Fever, weakness for 1 week  EXAM: CHEST  2 VIEW  COMPARISON:  02/09/2015  FINDINGS: Cardiomediastinal silhouette is stable. Persistent streaky infiltrate/ pneumonia in right lower lobe. No pulmonary edema. Bony thorax is unremarkable.  IMPRESSION: Persistent streaky infiltrate/ pneumonia right lower lobe. No pulmonary edema.   Electronically Signed   By: Lahoma Crocker M.D.   On: 02/09/2015 13:06   Ct Angio Chest Pe W/cm &/or Wo Cm  02/09/2015   CLINICAL DATA:  Hypoxia. Nausea, weakness, shortness of breath since chemotherapy 2 weeks ago.  EXAM: CT ANGIOGRAPHY CHEST  WITH CONTRAST  TECHNIQUE: Multidetector CT imaging of the chest was performed using the standard protocol during bolus administration of intravenous contrast. Multiplanar CT image reconstructions and MIPs were obtained to evaluate the vascular anatomy.  CONTRAST:  123mL OMNIPAQUE IOHEXOL 350 MG/ML SOLN  COMPARISON:  Chest x-ray earlier today.  FINDINGS: Heart is normal size. Aorta is normal caliber. No mediastinal, hilar, or axillary adenopathy. No filling defects in the pulmonary arteries to suggest pulmonary emboli. There are bilateral airspace opacities, most confluent in the lower lobes, right greater than left, but also present in the upper lobes with ground-glass opacities present. Findings most compatible with multifocal pneumonia. No pleural effusions.  Chest wall soft tissues are unremarkable. Imaging into the upper abdomen shows no acute findings.  No acute bony abnormality or focal bone lesion.  Review of the MIP images confirms the above findings.  IMPRESSION: No evidence of pulmonary embolus.  Bilateral airspace opacities, most confluent in the lower lobes but also noted in the upper lobes concerning for multifocal pneumonia.   Electronically Signed   By: Rolm Baptise M.D.   On: 02/09/2015 14:05   Dg Chest Port 1 View  02/14/2015   CLINICAL DATA:  49 year old male with history of healthcare associated pneumonia.  EXAM: PORTABLE CHEST - 1 VIEW  COMPARISON:  Chest x-ray 11/07/2014.  FINDINGS: Low lung volumes. Improving aeration throughout the lungs bilaterally with resolving multifocal airspace disease. Worsening linear opacity in the right mid lung, and in the left lung base, likely reflective of areas of increasing subsegmental atelectasis. No pleural effusions. No evidence of pulmonary edema. Heart size is normal. Mediastinal contours are unremarkable.  IMPRESSION: 1. Improving multifocal airspace disease compatible with resolving multilobar pneumonia. New areas of subsegmental atelectasis are  noted throughout the lungs bilaterally, as above, in the setting of low lung volumes.   Electronically Signed   By: Vinnie Langton M.D.   On: 02/14/2015 09:17   Dg Chest Port 1 View  02/12/2015   CLINICAL DATA:  Health care associated pneumonia, CHF, acute respiratory failure with hypoxia.  EXAM: PORTABLE CHEST - 1 VIEW  COMPARISON:  Portable chest x-ray of February 11, 2015  FINDINGS: The lungs are mildly hypoinflated. The interstitial markings have improved and the confluent alveolar opacities bilaterally are less conspicuous. The left hemidiaphragm is better demonstrated. There is no significant pleural effusion. The cardiac silhouette is normal in size. The bony thorax exhibits no acute abnormality.  IMPRESSION: Further interval improvement in bilateral pneumonia. The small left pleural effusion is less conspicuous today. When the patient can tolerate the procedure, a PA and lateral chest x-ray would be useful.   Electronically Signed   By: David  Martinique M.D.   On: 02/12/2015 07:28   Dg Chest Port 1 View  02/11/2015   CLINICAL DATA:  Acute respiratory failure with hypoxia, shortness of breath, CHF.  EXAM: PORTABLE CHEST - 1 VIEW  COMPARISON:  Chest x-ray and chest CT scan of February 09, 2015.  FINDINGS: The lungs are mildly hypoinflated. Persistent airspace opacities are present bilaterally greatest on the right in the upper lobe. The left hemidiaphragm is less well demonstrated today.  The cardiac silhouette is enlarged. The pulmonary vascularity is indistinct. There is small left pleural effusion. There is chronic widening of the right AC joint.  IMPRESSION: Bilateral pneumonia which has increased in conspicuity since yesterday's study. There is a small left pleural effusion.   Electronically Signed   By: David  Martinique M.D.   On: 02/11/2015 07:19   Dg Chest Port 1 View  02/09/2015   CLINICAL DATA:  Shortness of breath, nausea and weakness  EXAM: PORTABLE CHEST - 1 VIEW  COMPARISON:  None.  FINDINGS: The  heart size and mediastinal contours are within normal limits. Lungs are hypo aerated with crowding of the bronchovascular markings. Superimposed ill-defined right basilar airspace opacity is identified. The visualized skeletal structures are unremarkable.  IMPRESSION: Low volume exam with diffuse prominence of the interstitial markings and more focal ill-defined right lower lobe airspace opacity which could indicate atelectasis although early pneumonia could appear similar. If symptoms persist, consider PA and lateral chest radiographs obtained at full inspiration when the patient is clinically able.   Electronically Signed   By: Conchita Paris M.D.   On: 02/09/2015 11:40     PERTINENT LAB RESULTS: CBC:  Recent Labs  02/14/15 0502 02/15/15 0521  WBC 9.2 9.5  HGB 11.6* 10.2*  HCT 32.6* 29.1*  PLT 171 150   CMET CMP     Component Value Date/Time   NA 137 02/16/2015 0338   NA 136 02/05/2015 1339   NA 139 12/17/2014 1328   K 4.5 02/16/2015 0338   K 3.7 02/05/2015 1339   K 4.3 12/17/2014 1328   CL 106 02/16/2015 0338   CL 101 02/05/2015 1339   CO2 23 02/16/2015 0338   CO2 25 02/05/2015 1339   CO2 24 12/17/2014 1328   GLUCOSE 137* 02/16/2015 0338   GLUCOSE 83 02/05/2015 1339   GLUCOSE 90 12/17/2014 1328   BUN 29* 02/16/2015 0338   BUN 20 02/05/2015 1339   BUN 14.9 12/17/2014 1328   CREATININE 2.03* 02/16/2015 0338   CREATININE 0.8 02/05/2015 1339   CREATININE 0.8 12/17/2014 1328   CALCIUM 9.3 02/16/2015 0338   CALCIUM 8.8 02/05/2015 1339   CALCIUM 9.3 12/17/2014 1328   PROT 6.1* 02/09/2015 1118   PROT 5.9* 02/05/2015 1339   PROT 6.9 12/17/2014 1328   ALBUMIN 3.2* 02/09/2015 1118   ALBUMIN 3.2* 12/17/2014 1328   AST 16 02/09/2015 1118   AST 21 02/05/2015 1339   AST 13 12/17/2014 1328   ALT 41 02/09/2015 1118   ALT 45 02/05/2015 1339   ALT 23 12/17/2014 1328   ALKPHOS 38 02/09/2015 1118   ALKPHOS 37 02/05/2015 1339   ALKPHOS 56 12/17/2014 1328   BILITOT 1.2  02/09/2015 1118   BILITOT 0.80 02/05/2015 1339   BILITOT 0.59 12/17/2014 1328   GFRNONAA 37* 02/16/2015 0338   GFRAA 43* 02/16/2015 0338    GFR Estimated Creatinine Clearance: 58.5 mL/min (by C-G formula based on Cr of 2.03). No results for input(s): LIPASE, AMYLASE in the last 72 hours. No results for input(s): CKTOTAL, CKMB, CKMBINDEX, TROPONINI in the last 72 hours. Invalid input(s): POCBNP No results for input(s): DDIMER in the last 72 hours. No results for input(s): HGBA1C in the last 72 hours. No results for input(s): CHOL, HDL, LDLCALC, TRIG, CHOLHDL, LDLDIRECT in the last 72 hours. No results for input(s): TSH, T4TOTAL, T3FREE, THYROIDAB in the last 72 hours.  Invalid input(s): FREET3 No results for input(s): VITAMINB12, FOLATE, FERRITIN, TIBC, IRON, RETICCTPCT in the last 72  hours. Coags: No results for input(s): INR in the last 72 hours.  Invalid input(s): PT Microbiology: Recent Results (from the past 240 hour(s))  Culture, blood (routine x 2)     Status: None   Collection Time: 02/09/15 11:11 AM  Result Value Ref Range Status   Specimen Description BLOOD RIGHT FOREARM  Final   Special Requests BOTTLES DRAWN AEROBIC AND ANAEROBIC 5CC  Final   Culture   Final    NO GROWTH 5 DAYS Performed at [facility]    Report Status 02/14/2015 FINAL  Final  Culture, blood (routine x 2)     Status: None   Collection Time: 02/09/15 11:23 AM  Result Value Ref Range Status   Specimen Description BLOOD RIGHT HAND  Final   Special Requests BOTTLES DRAWN AEROBIC AND ANAEROBIC 4.5CC EACH  Final   Culture   Final    NO GROWTH 5 DAYS Performed at [facility]    Report Status 02/14/2015 FINAL  Final  Culture, expectorated sputum-assessment     Status: None   Collection Time: 02/12/15  7:36 AM  Result Value Ref Range Status   Specimen Description EXPECTORATED SPUTUM  Final   Special Requests NONE  Final   Sputum evaluation   Final    MICROSCOPIC FINDINGS SUGGEST  THAT THIS SPECIMEN IS NOT REPRESENTATIVE OF LOWER RESPIRATORY SECRETIONS. PLEASE RECOLLECT. Results Called to: Olin Pia AT [id number] 02/12/15 BY L BENFIELD    Report Status 02/12/2015 FINAL  Final    BRIEF HOSPITAL COURSE:  Acute hypoxic respiratory failure: Secondary to presumed HCAP vs pneumonitis from Ipilimumab. Much improved-now on room air, initially required 100% FiO2, slowly taper down to high flow oxygen-now tapered down to room air. Has completed a course of empiric broad-spectrum antibiotics, will not be discharged on any antibiotics, he will be on steroids until seen by oncology.  Active Problems: HCAP vs Pneumonitis from Ipilimumab: Managed with broad-spectrum intravenous antibiotics and IV Solu-Medrol.Has completed a course of antibiotics and will not be discharged on any further antibiotics. Also was on IV Solu-Medrol, now transitioned to prednisone. Significantly improved, no longer requiring oxygen at the time of discharge. All cultures negative. HIV also negative. Discussed with oncology-Dr. Marin Olp over the phone, okay to discharge from his point of view, he will see patient next week and will taper off prednisone. We will discharge on current dosing of 60 mg of prednisone. Patient appointment with the CCM already made, patient will require a repeat chest x-ray in the next 4 weeks or so.   Dysphagia: Suspect more from respiratory distress, SLP evaluation completed-advanced to regular diet  Mild ARF: Euvolemic on exam, ?etiology-UA negative, no indication of post obstructive uroppathy-creatinine seems to be levelling off-follow closely in the outpatient setting. Suspect will improve. Spoke with Dr. Delford Field will recheck electrolytes at next visit  History of paroxysmal SVT: Continue Cardizem.  History of anxiety: Continue Lexapro  History of dyslipidemia: Continue statin  History of chronic diastolic heart failure: Clinically compensated.   Hypokalemia:  Repleted  Thrombocytopenia: Resolved, could be secondary to chemotherapy. Follow.  History of metastatic melanoma: Primary oncologist Dr. Larence Penning during this hospital stay-he has an appointment with oncology on 9/2.  TODAY-DAY OF DISCHARGE:  Subjective:   James Byrd today has no headache,no chest abdominal pain,no new weakness tingling or numbness, feels much better wants to go home today.  Objective:   Blood pressure 100/76, pulse 120, temperature 97.6 F (36.4 C), temperature source Oral, resp. rate 20, height 6' (1.829  m), weight 115.758 kg (255 lb 3.2 oz), SpO2 93 %.  Intake/Output Summary (Last 24 hours) at 02/16/15 1024 Last data filed at 02/16/15 0900  Gross per 24 hour  Intake   1030 ml  Output   3300 ml  Net  -2270 ml   Filed Weights   02/14/15 0604 02/15/15 0534 02/16/15 0512  Weight: 109.7 kg (241 lb 13.5 oz) 109.72 kg (241 lb 14.2 oz) 115.758 kg (255 lb 3.2 oz)    Exam Awake Alert, Oriented *3, No new F.N deficits, Normal affect [place].AT,PERRAL Supple Neck,No JVD, No cervical lymphadenopathy appriciated.  Symmetrical Chest wall movement, Good air movement bilaterally, CTAB RRR,No Gallops,Rubs or new Murmurs, No Parasternal Heave +ve B.Sounds, Abd Soft, Non tender, No organomegaly appriciated, No rebound -guarding or rigidity. No Cyanosis, Clubbing or edema, No new Rash or bruise  DISCHARGE CONDITION: Stable  DISPOSITION: Home  DISCHARGE INSTRUCTIONS:    Activity:  As tolerated   Follow with Primary MD  Ann Held, MD  and other consultant as instructed your Hospitalist MD  Please get a complete blood count and chemistry panel checked by your Primary MD at your next visit, and again as instructed by your Primary MD.   If you had Pneumonia  at the Hospital: Please get a 2 view Chest X ray done in 4 weeks after hospital discharge or sooner if instructed your Hospilatist MD at the hospital.   You still have mild kidney failure at the time of  discharge, please ask your primary care M.D. or your oncologist to repeat a electrolyte panel at next visit  Get Medicines reviewed and adjusted. Please take all your medications with you for your next visit with your Primary MD  Please request your Primary MD to go over all hospital tests and procedure/radiological results at the follow up, please ask your Primary MD to get all Hospital records sent to his/her office.  If you experience worsening of your admission symptoms, develop shortness of breath, life threatening emergency, suicidal or homicidal thoughts you must seek medical attention immediately by calling 911 or calling your MD immediately  if symptoms less severe.  You must read complete instructions/literature along with all the possible adverse reactions/side effects for all the Medicines you take and that have been prescribed to you. Take any new Medicines after you have completely understood and accpet all the possible adverse reactions/side effects.   Do not drive when taking Pain medications.   Do not take more than prescribed Pain, Sleep and Anxiety Medications  Special Instructions: If you have smoked or chewed Tobacco  in the last 2 yrs please stop smoking, stop any regular Alcohol  and or any Recreational drug use.  Wear Seat belts while driving.  Please note  You were cared for by a hospitalist during your hospital stay. Once you are discharged, your primary care physician will handle any further medical issues. Please note that NO REFILLS for any discharge medications will be authorized once you are discharged, as it is imperative that you return to your primary care physician (or establish a relationship with a primary care physician if you do not have one) for your aftercare needs so that they can reassess your need for medications and monitor your lab values.  Diet recommendation: Heart Healthy diet   Discharge Instructions    Call MD for:  difficulty breathing,  headache or visual disturbances    Complete by:  As directed      Diet - low sodium heart  healthy    Complete by:  As directed      Increase activity slowly    Complete by:  As directed            Follow-up Information    Follow up with Ann Held, MD. Schedule an appointment as soon as possible for a visit in 1 week.   Specialty:  Family Medicine   Contact information:   [place] [id number] [phone]       Follow up with [name], MD On 02/20/2015.   Specialty:  Oncology   Why:  appt at 1:15 pm   Contact information:   [address], [address] [place] [address] [phone]       Follow up with PARRETT,TAMMY, NP On 03/12/2015.   Specialty:  Nurse Practitioner   Contact information:   [address] [address] [phone]       Please follow up.   Why:  appt at 9:30 am      Total Time spent on discharge equal 45 minutes.  SignedOren Binet 02/16/2015 10:24 AM

## 2015-02-16 NOTE — Progress Notes (Signed)
PCCM PROGRESS NOTE  ADMISSION DATE: 02/09/2015 CONSULT DATE: 02/10/2015 REFERRING PROVIDER: [facility]  CC: Short of breath     CULTURES: 8/22 Blood >>NO growth 8/22 Legionella Ag >> negative 8/22 Pneumococcal Ag >> negative 8/23 Influenza PCR >> negative  ANTIBIOTICS: 8/22 Fortaz >> 8/22 8/22 Zosyn >> 8/26 8/22 Vancomycin >> 8/26 8/24 Zithromax >> 8/26 8/26 Levaquin >> 02/16/15 (7d total abx)   STUDIES: 8/22 CT chest >> b/l ASD Rt > Lt 8/24 Echo >> mod LVH, EF 65 to [id number], grade 1 diastolic dysfx  EVENTS: [id number] Admit 8/24 Oncology consulted 8/25  high dose solumedrol per oncology started  02/15/15: Much better, on less oxygen  SUBJECTIVE/OVERNIGHT/INTERVAL HX 02/16/2015  - per patient - feeling better. Improved dyspnea. No complaints. On 5L [place]   OBJECTIVE: Temp:  [97.5 F (36.4 C)-97.8 F (36.6 C)] 97.6 F (36.4 C) (08/29 0512) Pulse Rate:  [67-72] 68 (08/29 0512) Resp:  [18-20] 20 (08/29 0512) BP: (100-116)/(70-76) 100/76 mmHg (08/29 0512) SpO2:  [95 %-99 %] 99 % (08/29 0512) Weight:  [115.758 kg (255 lb 3.2 oz)] 115.758 kg (255 lb 3.2 oz) (08/29 0512)   General: wearing 5 L [place]  , obese, sitting in chair. HEENT: no sinus tenderness. Mild cushingoing Cardiac: regular Chest: no wheeze, clearer Abd: soft, non tender Ext: no edema Neuro: normal strength. TREMORS + - says i sbaseline Skin: no rashes   PULMONARY  Recent Labs Lab 02/09/15 1129  PHART 7.482*  PCO2ART 36.2  PO2ART 83.0  HCO3 27.0*  TCO2 28  O2SAT 97.0    CBC  Recent Labs Lab 02/12/15 0529 02/14/15 0502 02/15/15 0521  HGB 11.9* 11.6* 10.2*  HCT 33.8* 32.6* 29.1*  WBC 4.3 9.2 9.5  PLT 116* 171 150    COAGULATION  Recent Labs Lab 02/09/15 2127  INR 1.07    CARDIAC  No results for input(s): TROPONINI in the last 168 hours. No results for input(s): PROBNP in the last 168 hours.   CHEMISTRY  Recent Labs Lab 02/10/15 0630  02/12/15 0529 02/13/15 0958 02/14/15 0835  02/15/15 0521 02/16/15 0338  NA 130*  < > 132* 133* 134* 137 137  K 3.2*  < > 3.6 4.4 4.6 4.6 4.5  CL 96*  < > 95* 98* 100* 103 106  CO2 25  < > 28 23 21* 23 23  GLUCOSE 98  < > 100* 169* 135* 104* 137*  BUN <5*  < > 7 15 20  25* 29*  CREATININE 0.65  < > 1.14 1.91* 2.04* 2.02* 2.03*  CALCIUM 8.2*  < > 8.8* 9.2 9.4 9.1 9.3  MG 1.7  --   --   --   --   --   --   < > = values in this interval not displayed. Estimated Creatinine Clearance: 58.5 mL/min (by C-G formula based on Cr of 2.03).   LIVER  Recent Labs Lab 02/09/15 1118 02/09/15 2127  AST 16  --   ALT 41  --   ALKPHOS 38  --   BILITOT 1.2  --   PROT 6.1*  --   ALBUMIN 3.2*  --   INR  --  1.07     INFECTIOUS  Recent Labs Lab 02/09/15 1130  02/09/15 2127 02/09/15 2304 02/10/15 1610 02/12/15 0529 02/14/15 0502  LATICACIDVEN 0.93  --  0.7 0.6  --   --   --   PROCALCITON  --   < > 0.14  --  0.22 0.24 <0.10  < > =  values in this interval not displayed.   ENDOCRINE CBG (last 3)   Recent Labs  02/15/15 1648 02/15/15 2151 02/16/15 0818  GLUCAP 221* 190* 132*         IMAGING x48h  - image(s) personally visualized CXR 02/09/15 -> 02/14/15 - marked improvement in infiltrates   DISCUSSION: 49 yo male presented with weakness, fever, nausea, cough, dyspnea, hypoxia from b/l pneumonia.  He has hx of Stage IIIB [id number]) melanoma of Rt toe s/p chemotherapy with [place] Bears (has 5% reported incidence of pneumonitis) >> last on 01/26/15. Markedly better AM 8/28  ASSESSMENT/PLAN: Principal Problem:   Drug-induced pneumonitis - VERVOY Active Problems:   Paroxysmal supraventricular tachycardia   Anxiety   Obstructive sleep apnea   Malignant melanoma of right great toe Stage IIIB [id number])   Hypokalemia   HLD (hyperlipidemia)   Chronic diastolic congestive heart failure   Thrombocytopenia   Acute respiratory failure with hypoxia   Acute hypoxic respiratory failure 2nd pulmonary infiltrates. DDdx of HCAP v  ILD due to [place]   - 02/16/15 pulmonary opinion - agree with onc this is more liklely due to [place].   - stays on 5L [place]  Plan: - oxygen to keep SpO2 > 88%   - dc abx -  day 7 of Abx  On 02/16/15  - prednisone per onc - ? Needs extended taper -likely will need home oxygen  - [facility] [facility] fu at [place] - he is from [place] but sees DR Marin Olp at [place] - wants consolidated fu there -03/12/15 with Rexene Edison NP   - if he nees someothin\g sooner he needs to come to [place] on [phone] please call     Future Appointments Date Time Provider [place]  02/20/2015 1:00 PM [facility] LAB [facility] None  02/20/2015 1:15 PM [name], MD [facility] None  02/20/2015 2:00 PM [facility] BED 1 [facility] None  03/12/2015 9:30 AM Tammy Jeralene Huff, NP [facility] None    Dr. Brand Males, M.D., [facility].C.P Pulmonary and Critical Care Medicine Staff Physician [place] [facility] Pager: [phone], If no answer or between  15:00h - 7:00h: call [phone]  02/16/2015 9:28 AM

## 2015-02-18 ENCOUNTER — Telehealth: Payer: Self-pay | Admitting: *Deleted

## 2015-02-18 NOTE — Telephone Encounter (Signed)
Wife stating that patient is having frequent, high volume urination. He is using the urinal at home due to frequency. He is urinating approximately 3060mls per day. Patient is trying to increase his fluid intake with water and Gatorade to compensate for output. Dr Marin Olp notified and he will assess patient during his regularly scheduled appointment this Friday. When call was returned to wife, she was unsatisfied with plan. Stated patient was dizzy and had lost his balance. Spoke once again to Dr Marin Olp who states that patient can move appointment up one day and see Sarah NP tomorrow. Wife aware of appointment switch. She would also like an order for a renal US. Will follow up with Dr Marin Olp.

## 2015-02-19 ENCOUNTER — Ambulatory Visit: Payer: BLUE CROSS/BLUE SHIELD

## 2015-02-19 ENCOUNTER — Ambulatory Visit: Payer: BLUE CROSS/BLUE SHIELD | Admitting: Family

## 2015-02-19 ENCOUNTER — Other Ambulatory Visit: Payer: BLUE CROSS/BLUE SHIELD

## 2015-02-20 ENCOUNTER — Ambulatory Visit: Payer: BLUE CROSS/BLUE SHIELD | Admitting: Hematology & Oncology

## 2015-02-20 ENCOUNTER — Ambulatory Visit: Payer: BLUE CROSS/BLUE SHIELD

## 2015-02-20 ENCOUNTER — Other Ambulatory Visit: Payer: BLUE CROSS/BLUE SHIELD

## 2015-02-27 ENCOUNTER — Telehealth: Payer: Self-pay | Admitting: Hematology & Oncology

## 2015-02-27 NOTE — Telephone Encounter (Signed)
Faxed [employer] [place] [employer] Medical Certification form for Americans with Disability Act (ADA) for today to:  F: [phone]    CV Number:  [id number]     COPY SCANNED

## 2015-03-03 ENCOUNTER — Telehealth: Payer: Self-pay | Admitting: Hematology & Oncology

## 2015-03-03 ENCOUNTER — Other Ambulatory Visit: Payer: Self-pay | Admitting: *Deleted

## 2015-03-03 DIAGNOSIS — C4371 Malignant melanoma of right lower limb, including hip: Secondary | ICD-10-CM

## 2015-03-03 MED ORDER — ESCITALOPRAM OXALATE 10 MG PO TABS: 10.0000 mg | ORAL_TABLET | Freq: Every day | ORAL | Status: DC

## 2015-03-03 NOTE — Telephone Encounter (Signed)
Faxed medical records via fax todayto: [facility] [place], [place]  [id number]  Ph: [phone] Fx: [phone]    [place] SCANNED

## 2015-03-06 ENCOUNTER — Encounter (HOSPITAL_COMMUNITY): Payer: Self-pay | Admitting: *Deleted

## 2015-03-06 ENCOUNTER — Emergency Department (HOSPITAL_COMMUNITY): Payer: BLUE CROSS/BLUE SHIELD

## 2015-03-06 ENCOUNTER — Other Ambulatory Visit: Payer: BLUE CROSS/BLUE SHIELD

## 2015-03-06 ENCOUNTER — Ambulatory Visit: Payer: BLUE CROSS/BLUE SHIELD | Admitting: Family

## 2015-03-06 ENCOUNTER — Inpatient Hospital Stay (HOSPITAL_COMMUNITY): Payer: BLUE CROSS/BLUE SHIELD

## 2015-03-06 ENCOUNTER — Inpatient Hospital Stay (HOSPITAL_COMMUNITY)
Admission: EM | Admit: 2015-03-06 | Discharge: 2015-03-16 | DRG: 871 | Disposition: A | Discharge status: Home / Self Care | Payer: BLUE CROSS/BLUE SHIELD | Attending: Internal Medicine | Admitting: Internal Medicine

## 2015-03-06 DIAGNOSIS — F329 Major depressive disorder, single episode, unspecified: Secondary | ICD-10-CM | POA: Diagnosis present

## 2015-03-06 DIAGNOSIS — C4371 Malignant melanoma of right lower limb, including hip: Secondary | ICD-10-CM

## 2015-03-06 DIAGNOSIS — R410 Disorientation, unspecified: Secondary | ICD-10-CM | POA: Diagnosis present

## 2015-03-06 DIAGNOSIS — D649 Anemia, unspecified: Secondary | ICD-10-CM | POA: Diagnosis present

## 2015-03-06 DIAGNOSIS — T451X5A Adverse effect of antineoplastic and immunosuppressive drugs, initial encounter: Secondary | ICD-10-CM | POA: Diagnosis present

## 2015-03-06 DIAGNOSIS — R112 Nausea with vomiting, unspecified: Secondary | ICD-10-CM | POA: Diagnosis present

## 2015-03-06 DIAGNOSIS — R4182 Altered mental status, unspecified: Secondary | ICD-10-CM

## 2015-03-06 DIAGNOSIS — G4733 Obstructive sleep apnea (adult) (pediatric): Secondary | ICD-10-CM | POA: Diagnosis present

## 2015-03-06 DIAGNOSIS — R52 Pain, unspecified: Secondary | ICD-10-CM

## 2015-03-06 DIAGNOSIS — H7093 Unspecified mastoiditis, bilateral: Secondary | ICD-10-CM | POA: Diagnosis present

## 2015-03-06 DIAGNOSIS — E872 Acidosis, unspecified: Secondary | ICD-10-CM | POA: Diagnosis present

## 2015-03-06 DIAGNOSIS — Y95 Nosocomial condition: Secondary | ICD-10-CM | POA: Diagnosis present

## 2015-03-06 DIAGNOSIS — D849 Immunodeficiency, unspecified: Secondary | ICD-10-CM

## 2015-03-06 DIAGNOSIS — E860 Dehydration: Secondary | ICD-10-CM | POA: Diagnosis present

## 2015-03-06 DIAGNOSIS — A419 Sepsis, unspecified organism: Secondary | ICD-10-CM | POA: Diagnosis not present

## 2015-03-06 DIAGNOSIS — K567 Ileus, unspecified: Secondary | ICD-10-CM

## 2015-03-06 DIAGNOSIS — Z7952 Long term (current) use of systemic steroids: Secondary | ICD-10-CM

## 2015-03-06 DIAGNOSIS — IMO0001 Reserved for inherently not codable concepts without codable children: Secondary | ICD-10-CM | POA: Diagnosis present

## 2015-03-06 DIAGNOSIS — J9621 Acute and chronic respiratory failure with hypoxia: Secondary | ICD-10-CM | POA: Diagnosis present

## 2015-03-06 DIAGNOSIS — C439 Malignant melanoma of skin, unspecified: Secondary | ICD-10-CM | POA: Diagnosis present

## 2015-03-06 DIAGNOSIS — E876 Hypokalemia: Secondary | ICD-10-CM | POA: Diagnosis present

## 2015-03-06 DIAGNOSIS — D696 Thrombocytopenia, unspecified: Secondary | ICD-10-CM | POA: Diagnosis present

## 2015-03-06 DIAGNOSIS — N179 Acute kidney failure, unspecified: Secondary | ICD-10-CM | POA: Diagnosis present

## 2015-03-06 DIAGNOSIS — Z7982 Long term (current) use of aspirin: Secondary | ICD-10-CM

## 2015-03-06 DIAGNOSIS — J349 Unspecified disorder of nose and nasal sinuses: Secondary | ICD-10-CM | POA: Diagnosis present

## 2015-03-06 DIAGNOSIS — D899 Disorder involving the immune mechanism, unspecified: Secondary | ICD-10-CM | POA: Diagnosis present

## 2015-03-06 DIAGNOSIS — I5032 Chronic diastolic (congestive) heart failure: Secondary | ICD-10-CM | POA: Diagnosis present

## 2015-03-06 DIAGNOSIS — R0602 Shortness of breath: Secondary | ICD-10-CM

## 2015-03-06 DIAGNOSIS — Z9989 Dependence on other enabling machines and devices: Secondary | ICD-10-CM | POA: Diagnosis present

## 2015-03-06 DIAGNOSIS — J189 Pneumonia, unspecified organism: Secondary | ICD-10-CM | POA: Diagnosis present

## 2015-03-06 DIAGNOSIS — R6889 Other general symptoms and signs: Secondary | ICD-10-CM

## 2015-03-06 DIAGNOSIS — J9811 Atelectasis: Secondary | ICD-10-CM | POA: Diagnosis present

## 2015-03-06 DIAGNOSIS — N390 Urinary tract infection, site not specified: Secondary | ICD-10-CM | POA: Diagnosis present

## 2015-03-06 DIAGNOSIS — R0600 Dyspnea, unspecified: Secondary | ICD-10-CM

## 2015-03-06 DIAGNOSIS — N289 Disorder of kidney and ureter, unspecified: Secondary | ICD-10-CM | POA: Diagnosis not present

## 2015-03-06 DIAGNOSIS — J019 Acute sinusitis, unspecified: Secondary | ICD-10-CM | POA: Diagnosis not present

## 2015-03-06 DIAGNOSIS — E785 Hyperlipidemia, unspecified: Secondary | ICD-10-CM | POA: Diagnosis present

## 2015-03-06 DIAGNOSIS — E2740 Unspecified adrenocortical insufficiency: Secondary | ICD-10-CM | POA: Diagnosis present

## 2015-03-06 DIAGNOSIS — I959 Hypotension, unspecified: Secondary | ICD-10-CM | POA: Diagnosis present

## 2015-03-06 DIAGNOSIS — J9601 Acute respiratory failure with hypoxia: Secondary | ICD-10-CM | POA: Diagnosis not present

## 2015-03-06 DIAGNOSIS — F419 Anxiety disorder, unspecified: Secondary | ICD-10-CM | POA: Diagnosis present

## 2015-03-06 DIAGNOSIS — H7090 Unspecified mastoiditis, unspecified ear: Secondary | ICD-10-CM | POA: Diagnosis present

## 2015-03-06 DIAGNOSIS — N171 Acute kidney failure with acute cortical necrosis: Secondary | ICD-10-CM | POA: Diagnosis not present

## 2015-03-06 DIAGNOSIS — R109 Unspecified abdominal pain: Secondary | ICD-10-CM

## 2015-03-06 DIAGNOSIS — D72829 Elevated white blood cell count, unspecified: Secondary | ICD-10-CM | POA: Diagnosis present

## 2015-03-06 LAB — COMPREHENSIVE METABOLIC PANEL
ALBUMIN: 2.8 g/dL — AB (ref 3.5–5.0)
ALK PHOS: 48 U/L (ref 38–126)
ALK PHOS: 45 U/L (ref 38–126)
ALT: 19 U/L (ref 17–63)
ALT: 21 U/L (ref 17–63)
ANION GAP: 10 (ref 5–15)
AST: 12 U/L — ABNORMAL LOW (ref 15–41)
AST: 25 U/L (ref 15–41)
Albumin: 3.2 g/dL — ABNORMAL LOW (ref 3.5–5.0)
Anion gap: 9 (ref 5–15)
BILIRUBIN TOTAL: 0.8 mg/dL (ref 0.3–1.2)
BILIRUBIN TOTAL: 1.3 mg/dL — AB (ref 0.3–1.2)
BUN: 14 mg/dL (ref 6–20)
BUN: 14 mg/dL (ref 6–20)
CALCIUM: 8.9 mg/dL (ref 8.9–10.3)
CALCIUM: 8.1 mg/dL — AB (ref 8.9–10.3)
CO2: 27 mmol/L (ref 22–32)
CO2: 29 mmol/L (ref 22–32)
CREATININE: 2.24 mg/dL — AB (ref 0.61–1.24)
Chloride: 104 mmol/L (ref 101–111)
Chloride: 104 mmol/L (ref 101–111)
Creatinine, Ser: 1.49 mg/dL — ABNORMAL HIGH (ref 0.61–1.24)
GFR calc Af Amer: 38 mL/min — ABNORMAL LOW (ref >60)
GFR calc non Af Amer: 33 mL/min — ABNORMAL LOW (ref >60)
GFR, EST NON AFRICAN AMERICAN: 54 mL/min — AB (ref >60)
GLUCOSE: 87 mg/dL (ref 65–99)
GLUCOSE: 99 mg/dL (ref 65–99)
Potassium: 3.1 mmol/L — ABNORMAL LOW (ref 3.5–5.1)
Potassium: 3.5 mmol/L (ref 3.5–5.1)
SODIUM: 142 mmol/L (ref 135–145)
Sodium: 141 mmol/L (ref 135–145)
TOTAL PROTEIN: 6.1 g/dL — AB (ref 6.5–8.1)
TOTAL PROTEIN: 5.4 g/dL — AB (ref 6.5–8.1)

## 2015-03-06 LAB — CBC WITH DIFFERENTIAL/PLATELET
BASOS PCT: 0 %
Basophils Absolute: 0 10*3/uL (ref 0.0–0.1)
Basophils Absolute: 0 10*3/uL (ref 0.0–0.1)
Basophils Relative: 0 %
EOS ABS: 0.3 10*3/uL (ref 0.0–0.7)
EOS ABS: 0.1 10*3/uL (ref 0.0–0.7)
EOS PCT: 3 %
EOS PCT: 1 %
HCT: 28.7 % — ABNORMAL LOW (ref 39.0–52.0)
HEMATOCRIT: 29.8 % — AB (ref 39.0–52.0)
Hemoglobin: 9.4 g/dL — ABNORMAL LOW (ref 13.0–17.0)
Hemoglobin: 9.7 g/dL — ABNORMAL LOW (ref 13.0–17.0)
LYMPHS ABS: 1 10*3/uL (ref 0.7–4.0)
Lymphocytes Relative: 10 %
Lymphocytes Relative: 3 %
Lymphs Abs: 0.3 10*3/uL — ABNORMAL LOW (ref 0.7–4.0)
MCH: 30.6 pg (ref 26.0–34.0)
MCH: 31.2 pg (ref 26.0–34.0)
MCHC: 32.8 g/dL (ref 30.0–36.0)
MCHC: 32.6 g/dL (ref 30.0–36.0)
MCV: 93.5 fL (ref 78.0–100.0)
MCV: 95.8 fL (ref 78.0–100.0)
MONO ABS: 0.2 10*3/uL (ref 0.1–1.0)
MONOS PCT: 6 %
MONOS PCT: 2 %
Monocytes Absolute: 0.7 10*3/uL (ref 0.1–1.0)
Neutro Abs: 8.5 10*3/uL — ABNORMAL HIGH (ref 1.7–7.7)
Neutro Abs: 7.2 10*3/uL (ref 1.7–7.7)
Neutrophils Relative %: 81 %
Neutrophils Relative %: 94 %
PLATELETS: 133 10*3/uL — AB (ref 150–400)
PLATELETS: 108 10*3/uL — AB (ref 150–400)
RBC: 3.07 MIL/uL — AB (ref 4.22–5.81)
RBC: 3.11 MIL/uL — ABNORMAL LOW (ref 4.22–5.81)
RDW: 16.9 % — ABNORMAL HIGH (ref 11.5–15.5)
RDW: 17 % — AB (ref 11.5–15.5)
WBC: 10.4 10*3/uL (ref 4.0–10.5)
WBC: 7.6 10*3/uL (ref 4.0–10.5)

## 2015-03-06 LAB — PHOSPHORUS: Phosphorus: 2.9 mg/dL (ref 2.5–4.6)

## 2015-03-06 LAB — LIPASE, BLOOD
LIPASE: 22 U/L (ref 22–51)
LIPASE: 25 U/L (ref 22–51)

## 2015-03-06 LAB — POCT I-STAT 3, ART BLOOD GAS (G3+)
Acid-Base Excess: 1 mmol/L (ref 0.0–2.0)
Bicarbonate: 25.6 mEq/L — ABNORMAL HIGH (ref 20.0–24.0)
O2 Saturation: 93 %
PCO2 ART: 45.6 mmHg — AB (ref 35.0–45.0)
PH ART: 7.368 (ref 7.350–7.450)
PO2 ART: 81 mmHg (ref 80.0–100.0)
Patient temperature: 103
TCO2: 27 mmol/L (ref 0–100)

## 2015-03-06 LAB — I-STAT ARTERIAL BLOOD GAS, ED
ACID-BASE EXCESS: 4 mmol/L — AB (ref 0.0–2.0)
Bicarbonate: 29.3 mEq/L — ABNORMAL HIGH (ref 20.0–24.0)
O2 Saturation: 94 %
PH ART: 7.408 (ref 7.350–7.450)
PO2 ART: 72 mmHg — AB (ref 80.0–100.0)
TCO2: 31 mmol/L (ref 0–100)
pCO2 arterial: 46.4 mmHg — ABNORMAL HIGH (ref 35.0–45.0)

## 2015-03-06 LAB — URINALYSIS, ROUTINE W REFLEX MICROSCOPIC
BILIRUBIN URINE: NEGATIVE
Glucose, UA: NEGATIVE mg/dL
KETONES UR: NEGATIVE mg/dL
Leukocytes, UA: NEGATIVE
NITRITE: NEGATIVE
PH: 6 (ref 5.0–8.0)
PROTEIN: 30 mg/dL — AB
Specific Gravity, Urine: 1.007 (ref 1.005–1.030)
Urobilinogen, UA: 0.2 mg/dL (ref 0.0–1.0)

## 2015-03-06 LAB — I-STAT CG4 LACTIC ACID, ED
Lactic Acid, Venous: 0.7 mmol/L (ref 0.5–2.0)
Lactic Acid, Venous: 0.41 mmol/L — ABNORMAL LOW (ref 0.5–2.0)

## 2015-03-06 LAB — TSH: TSH: 3.898 u[IU]/mL (ref 0.350–4.500)

## 2015-03-06 LAB — URINE MICROSCOPIC-ADD ON

## 2015-03-06 LAB — AMYLASE: AMYLASE: 43 U/L (ref 28–100)

## 2015-03-06 LAB — TROPONIN I: TROPONIN I: 0.05 ng/mL — AB (ref <0.031)

## 2015-03-06 LAB — MAGNESIUM: MAGNESIUM: 1.3 mg/dL — AB (ref 1.7–2.4)

## 2015-03-06 LAB — PROTIME-INR
INR: 1.25 (ref 0.00–1.49)
Prothrombin Time: 15.9 seconds — ABNORMAL HIGH (ref 11.6–15.2)

## 2015-03-06 LAB — PROCALCITONIN: Procalcitonin: 18.85 ng/mL

## 2015-03-06 LAB — LACTIC ACID, PLASMA: LACTIC ACID, VENOUS: 2.6 mmol/L — AB (ref 0.5–2.0)

## 2015-03-06 LAB — GLUCOSE, CAPILLARY: Glucose-Capillary: 90 mg/dL (ref 65–99)

## 2015-03-06 LAB — APTT: aPTT: 30 seconds (ref 24–37)

## 2015-03-06 LAB — STREP PNEUMONIAE URINARY ANTIGEN: STREP PNEUMO URINARY ANTIGEN: NEGATIVE

## 2015-03-06 MED ORDER — ESCITALOPRAM OXALATE 10 MG PO TABS
10.0000 mg | ORAL_TABLET | Freq: Every day | ORAL | Status: DC
  Administered 2015-03-07 – 2015-03-16 (×10): 10 mg via ORAL
  Filled 2015-03-06 (×11): qty 1

## 2015-03-06 MED ORDER — SODIUM CHLORIDE 0.9 % IV BOLUS (SEPSIS)
1000.0000 mL | Freq: Once | INTRAVENOUS | Status: AC
  Administered 2015-03-06: 1000 mL via INTRAVENOUS

## 2015-03-06 MED ORDER — VANCOMYCIN HCL 10 G IV SOLR
1250.0000 mg | INTRAVENOUS | Status: DC
  Administered 2015-03-07 – 2015-03-09 (×3): 1250 mg via INTRAVENOUS
  Filled 2015-03-06 (×4): qty 1250

## 2015-03-06 MED ORDER — TRAMADOL HCL 50 MG PO TABS
50.0000 mg | ORAL_TABLET | Freq: Four times a day (QID) | ORAL | Status: DC | PRN
  Administered 2015-03-10 – 2015-03-11 (×2): 50 mg via ORAL
  Filled 2015-03-06 (×2): qty 1

## 2015-03-06 MED ORDER — PIPERACILLIN-TAZOBACTAM 3.375 G IVPB 30 MIN
3.3750 g | Freq: Once | INTRAVENOUS | Status: AC
  Administered 2015-03-06: 3.375 g via INTRAVENOUS
  Filled 2015-03-06: qty 50

## 2015-03-06 MED ORDER — SODIUM CHLORIDE 0.9 % IV SOLN
INTRAVENOUS | Status: DC
  Administered 2015-03-06 – 2015-03-12 (×3): via INTRAVENOUS

## 2015-03-06 MED ORDER — PROMETHAZINE HCL 25 MG/ML IJ SOLN
12.5000 mg | Freq: Four times a day (QID) | INTRAMUSCULAR | Status: DC | PRN
  Administered 2015-03-06 – 2015-03-12 (×4): 12.5 mg via INTRAVENOUS
  Filled 2015-03-06 (×4): qty 1

## 2015-03-06 MED ORDER — SODIUM CHLORIDE 0.9 % IV BOLUS (SEPSIS)
1000.0000 mL | Freq: Once | INTRAVENOUS | Status: AC
  Administered 2015-03-07: 1000 mL via INTRAVENOUS

## 2015-03-06 MED ORDER — ACETAMINOPHEN 325 MG PO TABS
650.0000 mg | ORAL_TABLET | ORAL | Status: DC | PRN
  Administered 2015-03-07 – 2015-03-14 (×6): 650 mg via ORAL
  Filled 2015-03-06 (×6): qty 2

## 2015-03-06 MED ORDER — ACETAMINOPHEN 650 MG RE SUPP
650.0000 mg | RECTAL | Status: DC | PRN
  Administered 2015-03-06: 650 mg via RECTAL
  Filled 2015-03-06: qty 1

## 2015-03-06 MED ORDER — CETYLPYRIDINIUM CHLORIDE 0.05 % MT LIQD
7.0000 mL | Freq: Two times a day (BID) | OROMUCOSAL | Status: DC
Start: 2015-03-06 — End: 2015-03-16
  Administered 2015-03-07 – 2015-03-15 (×16): 7 mL via OROMUCOSAL

## 2015-03-06 MED ORDER — METOPROLOL TARTRATE 1 MG/ML IV SOLN
INTRAVENOUS | Status: AC
  Administered 2015-03-06: 2.5 mg via INTRAVENOUS
  Filled 2015-03-06: qty 5

## 2015-03-06 MED ORDER — HALOPERIDOL LACTATE 5 MG/ML IJ SOLN
INTRAMUSCULAR | Status: AC
  Administered 2015-03-06: 5 mg via INTRAVENOUS
  Filled 2015-03-06: qty 1

## 2015-03-06 MED ORDER — POTASSIUM CHLORIDE CRYS ER 20 MEQ PO TBCR
40.0000 meq | EXTENDED_RELEASE_TABLET | Freq: Two times a day (BID) | ORAL | Status: DC
  Filled 2015-03-06: qty 2

## 2015-03-06 MED ORDER — PIPERACILLIN-TAZOBACTAM 3.375 G IVPB 30 MIN: 3.3750 g | Freq: Three times a day (TID) | INTRAVENOUS | Status: DC

## 2015-03-06 MED ORDER — ACETAMINOPHEN 325 MG PO TABS
650.0000 mg | ORAL_TABLET | Freq: Once | ORAL | Status: AC
  Administered 2015-03-06: 650 mg via ORAL
  Filled 2015-03-06: qty 2

## 2015-03-06 MED ORDER — MORPHINE SULFATE (PF) 2 MG/ML IV SOLN
1.0000 mg | INTRAVENOUS | Status: DC | PRN
  Administered 2015-03-06: 2 mg via INTRAVENOUS
  Filled 2015-03-06: qty 1

## 2015-03-06 MED ORDER — PIPERACILLIN-TAZOBACTAM 3.375 G IVPB
3.3750 g | Freq: Three times a day (TID) | INTRAVENOUS | Status: DC
Start: 2015-03-06 — End: 2015-03-11
  Administered 2015-03-06 – 2015-03-11 (×15): 3.375 g via INTRAVENOUS
  Filled 2015-03-06 (×18): qty 50

## 2015-03-06 MED ORDER — ASPIRIN 81 MG PO TABS: 81.0000 mg | ORAL_TABLET | Freq: Every day | ORAL | Status: DC

## 2015-03-06 MED ORDER — ONDANSETRON HCL 4 MG/2ML IJ SOLN
4.0000 mg | Freq: Once | INTRAMUSCULAR | Status: AC
  Administered 2015-03-06: 4 mg via INTRAVENOUS
  Filled 2015-03-06: qty 2

## 2015-03-06 MED ORDER — ONDANSETRON HCL 4 MG/2ML IJ SOLN
4.0000 mg | Freq: Four times a day (QID) | INTRAMUSCULAR | Status: DC | PRN
  Administered 2015-03-09 – 2015-03-11 (×4): 4 mg via INTRAVENOUS
  Filled 2015-03-06 (×4): qty 2

## 2015-03-06 MED ORDER — ENOXAPARIN SODIUM 40 MG/0.4ML ~~LOC~~ SOLN
40.0000 mg | SUBCUTANEOUS | Status: DC
Start: 2015-03-06 — End: 2015-03-16
  Administered 2015-03-06 – 2015-03-15 (×10): 40 mg via SUBCUTANEOUS
  Filled 2015-03-06 (×11): qty 0.4

## 2015-03-06 MED ORDER — DILTIAZEM HCL 60 MG PO TABS: 120.0000 mg | ORAL_TABLET | Freq: Every day | ORAL | Status: DC

## 2015-03-06 MED ORDER — ASPIRIN EC 81 MG PO TBEC
81.0000 mg | DELAYED_RELEASE_TABLET | Freq: Every day | ORAL | Status: DC
  Administered 2015-03-07 – 2015-03-16 (×10): 81 mg via ORAL
  Filled 2015-03-06 (×11): qty 1

## 2015-03-06 MED ORDER — DILTIAZEM HCL ER COATED BEADS 120 MG PO CP24
120.0000 mg | ORAL_CAPSULE | Freq: Every day | ORAL | Status: DC
  Administered 2015-03-07 – 2015-03-16 (×10): 120 mg via ORAL
  Filled 2015-03-06 (×12): qty 1

## 2015-03-06 MED ORDER — METHYLPREDNISOLONE SODIUM SUCC 125 MG IJ SOLR
125.0000 mg | Freq: Once | INTRAMUSCULAR | Status: AC
  Administered 2015-03-06: 125 mg via INTRAVENOUS
  Filled 2015-03-06: qty 2

## 2015-03-06 MED ORDER — METOPROLOL TARTRATE 1 MG/ML IV SOLN
2.5000 mg | Freq: Once | INTRAVENOUS | Status: AC
  Administered 2015-03-06: 2.5 mg via INTRAVENOUS

## 2015-03-06 MED ORDER — IOHEXOL 350 MG/ML SOLN
100.0000 mL | Freq: Once | INTRAVENOUS | Status: AC | PRN
  Administered 2015-03-06: 100 mL via INTRAVENOUS

## 2015-03-06 MED ORDER — HALOPERIDOL LACTATE 5 MG/ML IJ SOLN
5.0000 mg | Freq: Once | INTRAMUSCULAR | Status: AC
  Administered 2015-03-06: 5 mg via INTRAVENOUS

## 2015-03-06 MED ORDER — VANCOMYCIN HCL 10 G IV SOLR
2000.0000 mg | Freq: Once | INTRAVENOUS | Status: AC
  Administered 2015-03-06: 2000 mg via INTRAVENOUS
  Filled 2015-03-06 (×2): qty 2000

## 2015-03-06 NOTE — ED Notes (Signed)
Called dietary concerning pt lunch order.  Dietary instructed to deliver foot to inpatient room.

## 2015-03-06 NOTE — H&P (Signed)
[facility] History and Physical  Srinivas Deroos [id number] DOB: Jan 28, 1966 DOA: 03/06/2015  Referring physician: EDP PCP: Ann Held, MD   Chief Complaint: sob since one week.   HPI: James Byrd is a 49 y.o. male  With h/o SVT , hyperlipidemia, right great toe melanoma , s/p amputation on chemotherapy, diastolic heart failure, hyperlipidemia, thrombocytopenia, recently discharged after being treated for pneumonia on 8/29, presents again today for worsening sob, productive cough and nausea, vomiting. He reports after [facility], he was [facility] at [facility] for similar complaints, admitted there and was given antibiotics and discharged on 2 lit [place] oxygen and po antibiotics. ona rrival to ED, he was found to be tachypnic, tachycardic, hypoxic on 2 lit [place] oxygen. CT chest revealed persistent bilateral air space disease partially improved when compared to previous exam but worse in the posterior right upper lobe. His oxygen requirement increased and he is on 4 lit [place] oxygen. ABG done, showed normal pH but low p02. He was referred to medical service for admission for evaluation and management of  Recurrent pneumonia, in immunocompromised state.   Review of Systems:  Constitutional:  fatigue HEENT:  No headaches, Difficulty swallowing,Tooth/dental problems,Sore throat,  No sneezing, itching, ear ache, nasal congestion, post nasal drip,  Cardio-vascular:  No chest pain, Orthopnea, PND, swelling in lower extremities, anasarca, dizziness, palpitations  GI:  Nausea, vomiting and loss of apettite.  Resp:  Sob, and productive cough since one week.  Skin:  no rash or lesions.  GU:  no dysuria, change in color of urine, no urgency or frequency. No flank pain.  Musculoskeletal:  generalized body aches.  Psych:  No change in mood or affect. No depression or anxiety. No memory loss.   Past Medical History  Diagnosis Date  . Paroxysmal supraventricular  tachycardia 05/24/2012    Described in the treadmill report; details are pending   . Anxiety 05/24/2012  . Malignant melanoma of right great toe 11/14/2014  . HLD (hyperlipidemia)   . Chronic diastolic congestive heart failure    Past Surgical History  Procedure Laterality Date  . Rotator cuff repair  2010    3 yrs ago  . Rotator cuff repair    . Ablation    . R toe partial ambutation     Social History:  reports that he has never smoked. He does not have any smokeless tobacco history on file. He reports that he does not drink alcohol or use illicit drugs.  No Known Allergies  Family History  Problem Relation Age of Onset  . Stroke Mother   . COPD Father   . Diabetes Father   . Diabetes Sister   . Diabetes Brother     Prior to Admission medications   Medication Sig Start Date End Date Taking? Authorizing Provider  acetaminophen (TYLENOL) 500 MG tablet Take 500 mg by mouth every 6 (six) hours as needed for mild pain.   Yes Historical Provider, MD  aspirin 81 MG tablet Take 81 mg by mouth daily.   Yes Historical Provider, MD  diltiazem (CARDIZEM) 120 MG tablet Take 120 mg by mouth daily.   Yes Historical Provider, MD  escitalopram (LEXAPRO) 10 MG tablet Take 1 tablet (10 mg total) by mouth daily. 03/03/15  Yes [name], MD  Multiple Vitamins-Minerals (MENS ONE DAILY PO) Take 1 tablet by mouth daily.    Yes Historical Provider, MD  pravastatin (PRAVACHOL) 40 MG tablet Take 40 mg by mouth at bedtime.  Yes Historical Provider, MD  predniSONE (DELTASONE) 10 MG tablet Take 5 mg by mouth daily with breakfast.   Yes Historical Provider, MD  NON FORMULARY     Historical Provider, MD  predniSONE (DELTASONE) 20 MG tablet Take 3 tablets (60 mg total) by mouth daily with breakfast. Patient not taking: Reported on 03/06/2015 02/16/15   Jonetta Osgood, MD   Physical Exam: Filed Vitals:   03/06/15 1300 03/06/15 1315 03/06/15 1415 03/06/15 1453  BP: 109/71 111/62 124/80 130/83    Pulse: 85 83 80 86  Temp:    97.5 F (36.4 C)  TempSrc:    Oral  Resp: 12 10 20 17   Height:      Weight:      SpO2: 97% 95% 95% 95%    Wt Readings from Last 3 Encounters:  03/06/15 116.121 kg (256 lb)  02/16/15 115.758 kg (255 lb 3.2 oz)  01/23/15 117.482 kg (259 lb)    General:  Appears anxious and restless.  Eyes: PERRL, normal lids, irises & conjunctiva Neck: no thyromegaly. Cardiovascular: RRR, no m/r/g. No LE edema. Respiratory: no wheezing , or rhonchi, diminished at bases.  Abdomen: soft, ntnd, obese.  Skin: no rash or induration seen on limited exam Musculoskeletal: grossly normal tone BUE/BLE Neurologic: grossly non-focal.          Labs on Admission:  Basic Metabolic Panel:  Recent Labs Lab 03/06/15 0820  NA 141  K 3.1*  CL 104  CO2 27  GLUCOSE 87  BUN 14  CREATININE 1.49*  CALCIUM 8.9   Liver Function Tests:  Recent Labs Lab 03/06/15 0820  AST 12*  ALT 19  ALKPHOS 48  BILITOT 0.8  PROT 6.1*  ALBUMIN 3.2*    Recent Labs Lab 03/06/15 0820  LIPASE 22   No results for input(s): AMMONIA in the last 168 hours. CBC:  Recent Labs Lab 03/06/15 0820  WBC 10.4  NEUTROABS 8.5*  HGB 9.4*  HCT 28.7*  MCV 93.5  PLT 133*   Cardiac Enzymes: No results for input(s): CKTOTAL, CKMB, CKMBINDEX, TROPONINI in the last 168 hours.  BNP (last 3 results)  Recent Labs  02/09/15 1118 02/11/15 0830  BNP 17.0 9.3    ProBNP (last 3 results) No results for input(s): PROBNP in the last 8760 hours.  CBG: No results for input(s): GLUCAP in the last 168 hours.  Radiological Exams on Admission: Dg Chest 2 View  03/06/2015   CLINICAL DATA:  Fever and shortness of breath for 1 day  EXAM: CHEST  2 VIEW  COMPARISON:  February 14, 2015  FINDINGS: There has been interval clearing of patchy infiltrate from the lung bases. There is mild bibasilar atelectasis currently. No new opacity. Heart is upper normal in size with pulmonary vascularity within normal  limits. No adenopathy. No bone lesions.  IMPRESSION: Mild bibasilar atelectasis. Interval significant clearing for the bases compared to prior study. No new opacity. No change in cardiac silhouette.   Electronically Signed   By: Lowella Grip III M.D.   On: 03/06/2015 09:00   Ct Angio Chest Pe W/cm &/or Wo Cm  03/06/2015   CLINICAL DATA:  Shortness of breath x4 weeks, fever, recent pneumonia diagnosis  EXAM: CT ANGIOGRAPHY CHEST WITH CONTRAST  TECHNIQUE: Multidetector CT imaging of the chest was performed using the standard protocol during bolus administration of intravenous contrast. Multiplanar CT image reconstructions and MIPs were obtained to evaluate the vascular anatomy.  CONTRAST:  130mL OMNIPAQUE IOHEXOL 350 MG/ML SOLN  COMPARISON:  02/09/2015  FINDINGS: Right arm IV contrast injection. SVC patent. RV is nondilated. Dilated central pulmonary arteries suggesting pulmonary hypertension. Satisfactory opacification of pulmonary arteries noted, and there is no evidence of pulmonary emboli. Motion during the acquisition degrades some of the images through the bases. Patent pulmonary veins bilaterally. Adequate contrast opacification of the thoracic aorta with no dissection, aneurysm, or stenosis. There is classic 3-vessel brachiocephalic arch anatomy without proximal stenosis.  No pleural or pericardial effusion. No pneumothorax. No hilar or mediastinal adenopathy. Patchy airspace consolidation in the posterior segments of both upper lobes, increased on the right since previous exam. Partial improvement in the airspace consolidation in the lower lobes since the earlier study, with some residual consolidation/ scarring/ atelectasis posteriorly right greater than left. Thoracic spine and sternum intact. 2 cm probable cyst in hepatic segment 4. Other smaller nonspecific low-attenuation liver lesions. Remainder visualized upper abdomen unremarkable.  Review of the MIP images confirms the above findings.   IMPRESSION: 1. Negative for acute PE. 2. Persistent bilateral patchy airspace disease, partially improved in the lung bases since previous exam, but worse in the posterior right upper lobe.   Electronically Signed   By: Lucrezia Europe M.D.   On: 03/06/2015 12:20    EKG: independently reviewed showed sinus rhythm, 87/min.   Assessment/Plan Active Problems:   Anxiety   Obstructive sleep apnea   Malignant melanoma of right great toe Stage IIIB [id number])   Hypokalemia   HLD (hyperlipidemia)   Chronic diastolic congestive heart failure   Thrombocytopenia   Acute respiratory failure with hypoxia   HCAP (healthcare-associated pneumonia)   Acute respiratory failure with hypoxia, possibly from recurrent HCAP: - suspect aspiration pneumonia, but he was cleared from SLP on last admission.  - suspect immunocompromised state for recurrent pneumonia, apparently his pneumonia never completely resolved.  - CT chest showed persistent pneumonia with new areas of consolidation.  - nasal oxygen to keep sats greater than 90%.  - vancomycin and IV zosyn.  - might need pulmonary consultation.  - would not escalate the dose of steroids, resume same dose of steroids.    Malignant melanoma of the right great toe: Completed 3 cycles of chemotherapy.  Follow up with Dr Marin Olp as outpatient. Currently not on Ipilimumab.   Hypokalemia: Replete as needed.    Chronic diastolic heart failure; - appears to be compensated.    Nausea, vomiting: Symptomatic management.    Code Status: full code.  DVT Prophylaxis: Family Communication: family at bedside Disposition Plan: admit to telemetry, might need atleast 2 to 3 day stay, .   Time spent: 65 minutes  [facility] Pager [phone]

## 2015-03-06 NOTE — ED Notes (Signed)
Pt recently tx for pnx to ED c/o emesis and fever since yesterday.  Wife was told to bring pt to hospital if he had fever.  Last given tylenol at 4 am.  Placed on home O2 "until the pneumonia gets better", per wife.

## 2015-03-06 NOTE — Progress Notes (Signed)
[facility] hospitalist progress note. Chief complaint. Desats, abdominal pain, confusion and agitation. This 49 year old male history of SVT, hyperlipidemia, great toe melanoma status post amputation chemotherapy, congestive heart failure, etc. Patient was admitted recently for pneumonia following a one-week history of patient shortness of breath. Patient had a change in condition late this afternoon with complaints of severe abdominal pain, desats on nasal cannula oxygen, increased confusion and agitation. I was notified of the patient's condition and found that he had been transferred to ICU. I came see the patient at bedside there and found him alert but confused and somewhat agitated. A blood gas obtained prior to transport pH 7.408, PCO2 46.4, PO2 72, bicarbonate 29.3. A abdominal x-ray and a chest x-ray are pending. Critical care as initiated blood work and will be seeing the patient in consult to help manage. Repeat blood gas obtained on Venturi mask pH 7.368, PCO2 45.6, PO2 81, bicarbonate 25.6. Physical exam. Vital signs. Temperature 103.3, pulse 136, respiration 36, blood pressure 97/60. Blood pressure has since improved to the 130s over 70s. O2 sats 100% on ensure he mask. Patient remains arousable to stimulation but now somnolent status post administration of IV Haldol. Impression/plan. Problem #1. Fever, desats, abdominal pain, increased confusion and agitation. Patient now resting quietly status post IV Haldol. X-ray of abdomen and chest are pending. 12-lead EKG notes sinus tach without any indication of acute ischemia. Critical care as ordered a rainbow lab set and these results are pending. Critical care will be seeing in consult and we'll follow for any recommendations.

## 2015-03-06 NOTE — Progress Notes (Signed)
ANTIBIOTIC CONSULT NOTE - INITIAL  Pharmacy Consult for vancomycin Indication: pneumonia  No Known Allergies  Patient Measurements: Height: 6' (182.9 cm) Weight: 256 lb (116.121 kg) IBW/kg (Calculated) : 77.6  Vital Signs: Temp: 98.3 F (36.8 C) (09/16 1007) Temp Source: Oral (09/16 1007) BP: 116/69 mmHg (09/16 1030) Pulse Rate: 89 (09/16 1030) Intake/Output from previous day:   Intake/Output from this shift: Total I/O In: -  Out: 250 [Urine:250]  Labs:  Recent Labs  03/06/15 0820  WBC 10.4  HGB 9.4*  PLT 133*  CREATININE 1.49*   Estimated Creatinine Clearance: 79.8 mL/min (by C-G formula based on Cr of 1.49). No results for input(s): VANCOTROUGH, VANCOPEAK, VANCORANDOM, GENTTROUGH, GENTPEAK, GENTRANDOM, TOBRATROUGH, TOBRAPEAK, TOBRARND, AMIKACINPEAK, AMIKACINTROU, AMIKACIN in the last 72 hours.   Microbiology: Recent Results (from the past 720 hour(s))  Culture, blood (routine x 2)     Status: None   Collection Time: 02/09/15 11:11 AM  Result Value Ref Range Status   Specimen Description BLOOD RIGHT FOREARM  Final   Special Requests BOTTLES DRAWN AEROBIC AND ANAEROBIC 5CC  Final   Culture   Final    NO GROWTH 5 DAYS Performed at [facility]    Report Status 02/14/2015 FINAL  Final  Culture, blood (routine x 2)     Status: None   Collection Time: 02/09/15 11:23 AM  Result Value Ref Range Status   Specimen Description BLOOD RIGHT HAND  Final   Special Requests BOTTLES DRAWN AEROBIC AND ANAEROBIC 4.5CC EACH  Final   Culture   Final    NO GROWTH 5 DAYS Performed at [facility]    Report Status 02/14/2015 FINAL  Final  Culture, expectorated sputum-assessment     Status: None   Collection Time: 02/12/15  7:36 AM  Result Value Ref Range Status   Specimen Description EXPECTORATED SPUTUM  Final   Special Requests NONE  Final   Sputum evaluation   Final    MICROSCOPIC FINDINGS SUGGEST THAT THIS SPECIMEN IS NOT REPRESENTATIVE OF LOWER  RESPIRATORY SECRETIONS. PLEASE RECOLLECT. Results Called to: Olin Pia AT [id number] 02/12/15 BY L BENFIELD    Report Status 02/12/2015 FINAL  Final    Medical History: Past Medical History  Diagnosis Date  . Paroxysmal supraventricular tachycardia 05/24/2012    Described in the treadmill report; details are pending   . Anxiety 05/24/2012  . Malignant melanoma of right great toe 11/14/2014  . HLD (hyperlipidemia)   . Chronic diastolic congestive heart failure     Assessment: 47 yom presenting with fever, emesis, SOB, cough. PMH of malignant melanoma s/p [place]. Recent admission to [facility] for suspected pna, diagnosed with medication-induced pneumonitis secondary to [facility]. Not on abx at home. Also admitted to [facility] in between for 3 days. Pharmacy consulted to dose vancomycin for HCAP. Also on Zosyn per MD. WBC 10.4, afebrile. SCr 1.49 on admit, normalized CrCl~61  9/16 vanc>> 9/16 zosyn>>  9/16 UC>>   Goal of Therapy:  Vancomycin trough level 15-20 mcg/ml  Plan:  Vanc 2g IV x 1 dose; then Vanc 1250mg  IV q24h Zosyn 3.375g IV (28min inf) x 1; then Zosyn 3.375g IV q8h Mon clinical progress, c/s, abx plan VT@SS  as indicated Mon renal function closely - may need to adjust vanc  Elicia Lamp, PharmD Clinical Pharmacist Pager [phone] 03/06/2015 1:15 PM

## 2015-03-06 NOTE — ED Notes (Signed)
[facility] has been Re-paged for Dr. Claretta Fraise

## 2015-03-06 NOTE — Consult Note (Signed)
Referral MD  Reason for Referral: Stage III melanoma of the right toe-lethargy and pneumonia   Chief Complaint  Patient presents with  . Emesis  . Fever    hx of pneumonia  : No history  HPI: James Byrd is well-known to me. He is a 49 year old white gentleman. He had melanoma of the right toenail. This was of the first toe. This was a subungual melanoma. He had a 1 amputation of the first toe. He was found to have microscopic lymph nodes positive in the right inguinal region.  He was given adjuvant chemotherapy with Yervoy. He has tolerated this incredibly poorly. He's had numerous palpitations. We've only given him 3 out of 4 cycles. He cannot tolerate a fourth cycle.  He was hospitalized about 3 weeks ago. He had pulmonary issues. It isn't clear whether or not this was from the [place].  He was improving. He then began to decline once again. He was so finally readmitted.  His labs this admission are decent. His was a creatinine is 1.49. His hemoglobin will 9.4. His BUN is 14 . Liver function tests are okay. His albumin is 3.2. His lactic acid is normal.  He did have a CT angiogram done. There was no pulmonary embolism. In no obvious metastatic disease. He had some patchy air space disease at the bases which looked a little bit better.  He is quite lethargic. He really does not answer questions. He is on a facemask oxygen set up.  He's had no rashes.  He's had no diarrhea.  He had one episode of fever.  His wife has been incredibly supportive. She really has done her best try to help him.  We were notified of his admission.   Past Medical History  Diagnosis Date  . Paroxysmal supraventricular tachycardia 05/24/2012    Described in the treadmill report; details are pending   . Anxiety 05/24/2012  . Malignant melanoma of right great toe 11/14/2014  . HLD (hyperlipidemia)   . Chronic diastolic congestive heart failure   :  Past Surgical History  Procedure Laterality Date  .  Rotator cuff repair  2010    3 yrs ago  . Rotator cuff repair    . Ablation    . R toe partial ambutation    :   Current facility-administered medications:  .  acetaminophen (TYLENOL) tablet 650 mg, 650 mg, Oral, Q4H PRN, Hosie Poisson, MD .  antiseptic oral rinse (CPC / CETYLPYRIDINIUM CHLORIDE 0.05%) solution 7 mL, 7 mL, Mouth Rinse, BID, Hosie Poisson, MD .  aspirin EC tablet 81 mg, 81 mg, Oral, Daily, Hosie Poisson, MD .  diltiazem (CARDIZEM CD) 24 hr capsule 120 mg, 120 mg, Oral, Daily, Hosie Poisson, MD .  enoxaparin (LOVENOX) injection 40 mg, 40 mg, Subcutaneous, Q24H, Hosie Poisson, MD .  escitalopram (LEXAPRO) tablet 10 mg, 10 mg, Oral, Daily, Hosie Poisson, MD .  morphine 2 MG/ML injection 1-2 mg, 1-2 mg, Intravenous, Q4H PRN, Hosie Poisson, MD, 2 mg at 03/06/15 1612 .  ondansetron (ZOFRAN) injection 4 mg, 4 mg, Intravenous, Q6H PRN, Hosie Poisson, MD .  piperacillin-tazobactam (ZOSYN) IVPB 3.375 g, 3.375 g, Intravenous, 3 times per day, Romona Curls, [facility] .  potassium chloride SA (K-DUR,KLOR-CON) CR tablet 40 mEq, 40 mEq, Oral, BID, Hosie Poisson, MD .  promethazine (PHENERGAN) injection 12.5 mg, 12.5 mg, Intravenous, Q6H PRN, Hosie Poisson, MD, 12.5 mg at 03/06/15 1605 .  traMADol (ULTRAM) tablet 50 mg, 50 mg, Oral, Q6H PRN, Hosie Poisson,  MD .  Derrill Memo ON 03/07/2015] vancomycin (VANCOCIN) 1,250 mg in sodium chloride 0.9 % 250 mL IVPB, 1,250 mg, Intravenous, Q24H, Romona Curls, RPH  Facility-Administered Medications Ordered in Other Encounters:  .  0.9 %  sodium chloride infusion, , Intravenous, Continuous, [name], MD, Last Rate: 500 mL/hr at 02/05/15 1510:  . antiseptic oral rinse  7 mL Mouth Rinse BID  . aspirin EC  81 mg Oral Daily  . diltiazem  120 mg Oral Daily  . enoxaparin (LOVENOX) injection  40 mg Subcutaneous Q24H  . escitalopram  10 mg Oral Daily  . piperacillin-tazobactam (ZOSYN)  IV  3.375 g Intravenous 3 times per day  . potassium chloride  40 mEq Oral BID  .  [START ON 03/07/2015] vancomycin  1,250 mg Intravenous Q24H  :  No Known Allergies:  Family History  Problem Relation Age of Onset  . Stroke Mother   . COPD Father   . Diabetes Father   . Diabetes Sister   . Diabetes Brother   :  Social History   Social History  . Marital Status: Married    Spouse Name: N/A  . Number of Children: N/A  . Years of Education: N/A   Occupational History  . Not on file.   Social History Main Topics  . Smoking status: Never Smoker   . Smokeless tobacco: Not on file  . Alcohol Use: No  . Drug Use: No  . Sexual Activity: Yes   Other Topics Concern  . Not on file   Social History Narrative  :  Pertinent items are noted in HPI.  Exam: Patient Vitals for the past 24 hrs:  BP Temp Temp src Pulse Resp SpO2 Height Weight  03/06/15 1844 97/60 mmHg (!) 103.3 F (39.6 C) Rectal (!) 136 - - - -  03/06/15 1826 (!) 107/55 mmHg - - (!) 132 - 94 % - -  03/06/15 1806 - - - - - 95 % - -  03/06/15 1453 130/83 mmHg 97.5 F (36.4 C) Oral 86 17 95 % - -  03/06/15 1415 124/80 mmHg - - 80 20 95 % - -  03/06/15 1315 111/62 mmHg - - 83 10 95 % - -  03/06/15 1300 109/71 mmHg - - 85 12 97 % - -  03/06/15 1245 110/69 mmHg - - 88 15 95 % - -  03/06/15 1030 116/69 mmHg - - 89 11 93 % - -  03/06/15 1007 - 98.3 F (36.8 C) Oral - - - - -  03/06/15 0945 131/91 mmHg - - 87 24 92 % - -  03/06/15 0900 128/95 mmHg - - 87 23 92 % - -  03/06/15 0830 107/71 mmHg - - 94 15 93 % - -  03/06/15 0810 114/69 mmHg 98.7 F (37.1 C) Oral 94 12 92 % 6' (1.829 m) 256 lb (116.121 kg)  03/06/15 0809 114/69 mmHg 98.7 F (37.1 C) Oral 93 14 93 % - -    has above    Recent Labs  03/06/15 0820  WBC 10.4  HGB 9.4*  HCT 28.7*  PLT 133*    Recent Labs  03/06/15 0820  NA 141  K 3.1*  CL 104  CO2 27  GLUCOSE 87  BUN 14  CREATININE 1.49*  CALCIUM 8.9    Blood smear review: None  Pathology: None     Assessment and Plan: James Byrd is a 49 year old gentleman  with a stage III subungual melanoma of the  right great toe. He had the toe amputated.Marland Kitchen He had a positive inguinal lymph node in the right groin. He has had adjuvant immunotherapy with Yervoy. He's had only 3 cycles out of 4. He will not give the fourth cycle because of toxicity. He's had numerous bouts of toxicity from the [facility].  The one thing I do not want to miss his hypo-thyroidism. This is a well-known complication from your avoid. I will check a TSH on him.  I really don't have any other ideas. I would think that the year Voit should be out of his system. He has not had a dose now for at least 6 weeks.  He is somewhat anemic. I don't think he is bleeding.  I will follow along and try to help out anyway that I can.  Lum Keas

## 2015-03-06 NOTE — Significant Event (Signed)
Rapid Response Event Note  Overview: Time Called: [id number] Arrival Time: 1823 Event Type: Neurologic, Respiratory, Cardiac  Initial Focused Assessment: Patient with increased WOB,  Decreased LOC.  Able to arouse and answer questions.  Initially patient was very calm. BP 107/55  ST 136  RR 36  O2 sat 96 on NRB  Rectal temp 103.3   Lung sounds coarse and Expiratory wheezing. Heart tones regular. Patient pale and diaphoretic  Interventions: Attempted to wean O2 to [place] desat to 78% placed back on 100% NRB Patient becoming very restless and complaining of sever abdominal pain and back pain. Bp 97/60  ST 130s  RR 36  O2 sat 99% on NRB NS bolus started via PIV. Urgently transferred to 2M11 via bed with O2 and heart monitor. RN at bedside to receive patient and report.  ELink notified of patient admission, Dr Milinda Hirschfeld managing patient care. Wife at bedside during entire event.  Event Summary: Name of Physician Notified: Dr Karleen Hampshire at [phone]  Name of Consulting Physician Notified: Dr Milinda Hirschfeld at [place]: Transferred (Comment) [phone])  Event End Time: 1954  Raliegh Ip

## 2015-03-06 NOTE — Progress Notes (Addendum)
Admission note:  Arrival Method: ED stretcher Mental Orientation: alert & oriented x 4  Telemetry: box #25 applied and CCMD notified Assessment: completed  Skin: healed amputation of right great toe  IV: right forearm with vancomycin infusing  Pain: pt states abdominal pain above 10 on number scale; Dr. Karleen Hampshire notified  Tubes: N/A Safety Measures: Patient Handbook has been given, and discussed the Fall Prevention worksheet. Left at bedside  Admission: Completed and admission orders have been written  6E Orientation: Patient has been oriented to the unit, staff and to the room.  Family: At the [place], RN [facility] Phone [phone]

## 2015-03-06 NOTE — Progress Notes (Signed)
[place]  Critical value received:  Lactic acid-2.6  Date of notification:  03/06/2015  Time of notification:  8:57 PM  Critical value read back:Yes.    Nurse who received alert:  Dillard Essex  MD notified (1st page):  Dr. Ancil Linsey  Time of first page:  8:57 PM  MD notified (2nd page):  Time of second page:  Responding MD:  Dr. Ancil Linsey  Time MD responded:  8:57 PM

## 2015-03-06 NOTE — ED Provider Notes (Signed)
CSN: [id number]     Arrival date & time 03/06/15  0800 History   First MD Initiated Contact with Patient 03/06/15 [phone]     Chief Complaint  Patient presents with  . Emesis  . Fever    hx of pneumonia   Patient is a 49 y.o. male presenting with general illness. The history is provided by the patient. No language interpreter was used.  Illness Location:  Generalized Quality:  SOB, cough, N/V Severity:  Moderate Onset quality:  Gradual Timing:  Constant Progression:  Worsening Chronicity:  Recurrent Context:  PMHx of malignant melanoma of right great toe s/p steroids, monoclonal antibody, and adjuvant therapy with Yervoy presenting with SOB, cough, and N/V. Admitted to our facility from 02/09/15 to 02/16/15 for suspected pneumonia and was diagnosed with medication induced pneumonitis secondary to his adjuvant Yervoy therapy. Had received IV antibiotics inpatient but not discharged home on about therapy. Admitted a second time to [facility] for 3 days during which time he received a second round of IV abx Associated symptoms: cough, fever, nausea, shortness of breath and vomiting   Associated symptoms: no abdominal pain and no chest pain     Past Medical History  Diagnosis Date  . Paroxysmal supraventricular tachycardia 05/24/2012    Described in the treadmill report; details are pending   . Anxiety 05/24/2012  . Malignant melanoma of right great toe 11/14/2014  . HLD (hyperlipidemia)   . Chronic diastolic congestive heart failure    Past Surgical History  Procedure Laterality Date  . Rotator cuff repair  2010    3 yrs ago  . Rotator cuff repair    . Ablation    . R toe partial ambutation     Family History  Problem Relation Age of Onset  . Stroke Mother   . COPD Father   . Diabetes Father   . Diabetes Sister   . Diabetes Brother    Social History  Substance Use Topics  . Smoking status: Never Smoker   . Smokeless tobacco: None  . Alcohol Use: No    Review of  Systems  Constitutional: Positive for fever. Negative for chills.  Respiratory: Positive for cough and shortness of breath.   Cardiovascular: Negative for chest pain.  Gastrointestinal: Positive for nausea and vomiting. Negative for abdominal pain.  All other systems reviewed and are negative.   Allergies  Review of patient's allergies indicates no known allergies.  Home Medications   Prior to Admission medications   Medication Sig Start Date End Date Taking? Authorizing Provider  acetaminophen (TYLENOL) 500 MG tablet Take 500 mg by mouth every 6 (six) hours as needed for mild pain.   Yes Historical Provider, MD  aspirin 81 MG tablet Take 81 mg by mouth daily.   Yes Historical Provider, MD  diltiazem (CARDIZEM) 120 MG tablet Take 120 mg by mouth daily.   Yes Historical Provider, MD  escitalopram (LEXAPRO) 10 MG tablet Take 1 tablet (10 mg total) by mouth daily. 03/03/15  Yes [name], MD  Multiple Vitamins-Minerals (MENS ONE DAILY PO) Take 1 tablet by mouth daily.    Yes Historical Provider, MD  pravastatin (PRAVACHOL) 40 MG tablet Take 40 mg by mouth at bedtime.    Yes Historical Provider, MD  predniSONE (DELTASONE) 10 MG tablet Take 5 mg by mouth daily with breakfast.   Yes Historical Provider, MD  NON FORMULARY     Historical Provider, MD  predniSONE (DELTASONE) 20 MG tablet Take 3 tablets (60  mg total) by mouth daily with breakfast. Patient not taking: Reported on 03/06/2015 02/16/15   Jonetta Osgood, MD   BP 116/69 mmHg  Pulse 89  Temp(Src) 98.3 F (36.8 C) (Oral)  Resp 11  Ht 6' (1.829 m)  Wt 256 lb (116.121 kg)  BMI 34.71 kg/m2  SpO2 93%   Physical Exam  Constitutional: He is oriented to person, place, and time. No distress.  Overweight middle-aged male lying in stretcher in mild/moderate distress secondary to nausea  HENT:  Head: Normocephalic and atraumatic.  Eyes: Conjunctivae are normal. Pupils are equal, round, and reactive to light.  Neck: Normal range of  motion. Neck supple.  Cardiovascular: Normal rate, regular rhythm and intact distal pulses.   Pulmonary/Chest: He exhibits no tenderness.  Mildly tachypneic to mid 20s. Maintaining oxygen saturations in the low to mid 90s on 2 L nasal cannula  Abdominal: Soft. Bowel sounds are normal. He exhibits no distension. There is no tenderness. There is no rebound.  Musculoskeletal: Normal range of motion.  Neurological: He is alert and oriented to person, place, and time.  Skin: Skin is warm and dry. He is not diaphoretic.  Nursing note and vitals reviewed.   ED Course  Procedures   Labs Review Labs Reviewed  COMPREHENSIVE METABOLIC PANEL - Abnormal; Notable for the following:    Potassium 3.1 (*)    Creatinine, Ser 1.49 (*)    Total Protein 6.1 (*)    Albumin 3.2 (*)    AST 12 (*)    GFR calc non Af Amer 54 (*)    All other components within normal limits  URINALYSIS, ROUTINE W REFLEX MICROSCOPIC (NOT AT [facility]) - Abnormal; Notable for the following:    Hgb urine dipstick SMALL (*)    Protein, ur 30 (*)    All other components within normal limits  CBC WITH DIFFERENTIAL/PLATELET - Abnormal; Notable for the following:    RBC 3.07 (*)    Hemoglobin 9.4 (*)    HCT 28.7 (*)    RDW 16.9 (*)    Platelets 133 (*)    Neutro Abs 8.5 (*)    All other components within normal limits  I-STAT CG4 LACTIC ACID, ED - Abnormal; Notable for the following:    Lactic Acid, Venous 0.41 (*)    All other components within normal limits  URINE CULTURE  LIPASE, BLOOD  URINE MICROSCOPIC-ADD ON  I-STAT CG4 LACTIC ACID, ED    Imaging Review Dg Chest 2 View  03/06/2015   CLINICAL DATA:  Fever and shortness of breath for 1 day  EXAM: CHEST  2 VIEW  COMPARISON:  February 14, 2015  FINDINGS: There has been interval clearing of patchy infiltrate from the lung bases. There is mild bibasilar atelectasis currently. No new opacity. Heart is upper normal in size with pulmonary vascularity within normal limits. No  adenopathy. No bone lesions.  IMPRESSION: Mild bibasilar atelectasis. Interval significant clearing for the bases compared to prior study. No new opacity. No change in cardiac silhouette.   Electronically Signed   By: Lowella Grip III M.D.   On: 03/06/2015 09:00   Ct Angio Chest Pe W/cm &/or Wo Cm  03/06/2015   CLINICAL DATA:  Shortness of breath x4 weeks, fever, recent pneumonia diagnosis  EXAM: CT ANGIOGRAPHY CHEST WITH CONTRAST  TECHNIQUE: Multidetector CT imaging of the chest was performed using the standard protocol during bolus administration of intravenous contrast. Multiplanar CT image reconstructions and MIPs were obtained to evaluate the vascular  anatomy.  CONTRAST:  182mL OMNIPAQUE IOHEXOL 350 MG/ML SOLN  COMPARISON:  02/09/2015  FINDINGS: Right arm IV contrast injection. SVC patent. RV is nondilated. Dilated central pulmonary arteries suggesting pulmonary hypertension. Satisfactory opacification of pulmonary arteries noted, and there is no evidence of pulmonary emboli. Motion during the acquisition degrades some of the images through the bases. Patent pulmonary veins bilaterally. Adequate contrast opacification of the thoracic aorta with no dissection, aneurysm, or stenosis. There is classic 3-vessel brachiocephalic arch anatomy without proximal stenosis.  No pleural or pericardial effusion. No pneumothorax. No hilar or mediastinal adenopathy. Patchy airspace consolidation in the posterior segments of both upper lobes, increased on the right since previous exam. Partial improvement in the airspace consolidation in the lower lobes since the earlier study, with some residual consolidation/ scarring/ atelectasis posteriorly right greater than left. Thoracic spine and sternum intact. 2 cm probable cyst in hepatic segment 4. Other smaller nonspecific low-attenuation liver lesions. Remainder visualized upper abdomen unremarkable.  Review of the MIP images confirms the above findings.  IMPRESSION: 1.  Negative for acute PE. 2. Persistent bilateral patchy airspace disease, partially improved in the lung bases since previous exam, but worse in the posterior right upper lobe.   Electronically Signed   By: Lucrezia Europe M.D.   On: 03/06/2015 12:20   I have personally reviewed and evaluated these images and lab results as part of my medical decision-making.   EKG Interpretation   Date/Time:  Friday March 06 2015 09:01:16 EDT Ventricular Rate:  87 PR Interval:  182 QRS Duration: 89 QT Interval:  357 QTC Calculation: 429 R Axis:   61 Text Interpretation:  Sinus rhythm Borderline repolarization abnormality  since last tracing no significant change Confirmed by BELFI  MD, MELANIE  ([id number]) on 03/06/2015 9:04:04 AM      MDM  Mr. Ouyang is a 49 yo male w/ PMHx of malignant melanoma of right great toe s/p steroids, monoclonal antibody, and adjuvant therapy with Yervoy presenting with SOB, cough, and N/V. Admitted to our facility from 02/09/15 to 02/16/15 for suspected pneumonia and was diagnosed with medication induced pneumonitis secondary to his adjuvant Yervoy therapy. Had received IV antibiotics inpatient but not discharged home on about therapy. Admitted a second time to [facility] for 3 days during which time he received a second round of IV antibiotics and was discharged home on PO antibiotics. Each time patient improved with inpatient therapy. Worsening of symptoms over past 4-5 days.  Overweight middle-aged male lying in stretcher in mild/moderate distress secondary to nausea. Afebrile. Heart rate 80s to 90s. Normotensive. Mildly tachypneic to mid 20s. Maintaining oxygen saturations in the low to mid 90s on 2L nasal cannula.  UA negative for infection or severe dehydration. WBC 10.4. Hemoglobin 9.4 (history of chronic anemia, slightly decreased from baseline). CMP remarkable for creatinine of 1.49 (history of CKD, baseline creatinine appears to be 2.0). Lipase 22. Chest x-ray showing mild  bibasilar atelectasis with interval significant clearing at bases compared to prior study and noting no new opacities.  Given active cancer with ongoing treatment, persistent shortness of breath and cough, relatively unremarkable chest x-ray, and persistent increased oxygen requirement there is a concern for PE. CTA chest showing no evidence of pulmonary emboli and improving opacities at bases but worsening opacity in right upper lobe concerning for pneumonia. Patient also with increased oxygen demand from previous now on 4.5L maintaining saturations in low to mid 90's. Reviewed previous sputum cultures but inadequate to provide culture. Patient started on  broad-spectrum antibiotics with coverage including vancomycin and Zosyn for HCAP.  Patient admitted to hospitalist service for further evaluation and management of HCAP in the setting of increased oxygen demand and immunocompromised state. Patient understands and agrees with the plan and has no further questions or concerns this time.  Pt care discussed with and followed by my attending, Dr. Malvin Johns  Mayer Camel, MD Pager [phone]  Final diagnoses:  SOB (shortness of breath)  HCAP (healthcare-associated pneumonia)  Increased oxygen demand  Immunocompromised state    Mayer Camel, MD 03/06/15 1258  Malvin Johns, MD 03/06/15 1328

## 2015-03-06 NOTE — Progress Notes (Signed)
Went to pt's room d/t wife calling desk saying pt was having "really bad stomach pain." Pt states pain feels as though someone is punching him in his lower abdomen. Gave pt 2mg  of IV morphine. While in the room pt seemed to become very SOB and restless; O2 saturation 68%. Increased pt O2 to 4L; O2 saturation increase to 80 but dropped again to 76%. Applied non rebreather mask and O2 at 93%. Pt's heart rate sustaining in 130s; hypotensive. Pt responding to voice but not as coherent as earlier. Called Rapid Response to come evaluated pt. Called Dr. Karleen Hampshire to inform her of occurences; pt ultimately transferred to 2M12 per MD request.

## 2015-03-06 NOTE — ED Notes (Signed)
Pt placed in gown and in bed. Pt monitored by pulse ox, bp cuff, and 5-lead. 

## 2015-03-06 NOTE — ED Notes (Signed)
Meal tray ordered for pt  

## 2015-03-07 ENCOUNTER — Inpatient Hospital Stay (HOSPITAL_COMMUNITY): Payer: BLUE CROSS/BLUE SHIELD

## 2015-03-07 LAB — PROTEIN AND GLUCOSE, CSF
Glucose, CSF: 57 mg/dL (ref 40–70)
Total  Protein, CSF: 44 mg/dL (ref 15–45)

## 2015-03-07 LAB — CSF CELL COUNT WITH DIFFERENTIAL
RBC Count, CSF: 1758 /mm3 — ABNORMAL HIGH
RBC Count, CSF: 673 /mm3 — ABNORMAL HIGH
TUBE #: 3
Tube #: 1
WBC CSF: 1 /mm3 (ref 0–5)
WBC, CSF: 3 /mm3 (ref 0–5)

## 2015-03-07 LAB — HIV ANTIBODY (ROUTINE TESTING W REFLEX): HIV SCREEN 4TH GENERATION: NONREACTIVE

## 2015-03-07 LAB — BASIC METABOLIC PANEL
ANION GAP: 11 (ref 5–15)
BUN: 18 mg/dL (ref 6–20)
CALCIUM: 8.1 mg/dL — AB (ref 8.9–10.3)
CO2: 25 mmol/L (ref 22–32)
CREATININE: 2.88 mg/dL — AB (ref 0.61–1.24)
Chloride: 105 mmol/L (ref 101–111)
GFR calc Af Amer: 28 mL/min — ABNORMAL LOW (ref >60)
GFR calc non Af Amer: 24 mL/min — ABNORMAL LOW (ref >60)
GLUCOSE: 129 mg/dL — AB (ref 65–99)
Potassium: 3.9 mmol/L (ref 3.5–5.1)
Sodium: 141 mmol/L (ref 135–145)

## 2015-03-07 LAB — CBC
HEMATOCRIT: 28.9 % — AB (ref 39.0–52.0)
Hemoglobin: 9.4 g/dL — ABNORMAL LOW (ref 13.0–17.0)
MCH: 30.9 pg (ref 26.0–34.0)
MCHC: 32.5 g/dL (ref 30.0–36.0)
MCV: 95.1 fL (ref 78.0–100.0)
Platelets: 149 10*3/uL — ABNORMAL LOW (ref 150–400)
RBC: 3.04 MIL/uL — ABNORMAL LOW (ref 4.22–5.81)
RDW: 16.9 % — AB (ref 11.5–15.5)
WBC: 17.9 10*3/uL — ABNORMAL HIGH (ref 4.0–10.5)

## 2015-03-07 LAB — PROCALCITONIN: PROCALCITONIN: 80.25 ng/mL

## 2015-03-07 LAB — MRSA PCR SCREENING: MRSA BY PCR: NEGATIVE

## 2015-03-07 LAB — FERRITIN: FERRITIN: 670 ng/mL — AB (ref 24–336)

## 2015-03-07 LAB — INFLUENZA PANEL BY PCR (TYPE A & B)
H1N1FLUPCR: NOT DETECTED
INFLBPCR: NEGATIVE
Influenza A By PCR: NEGATIVE

## 2015-03-07 LAB — TROPONIN I
Troponin I: 0.04 ng/mL — ABNORMAL HIGH (ref <0.031)
Troponin I: <0.03 ng/mL (ref <0.031)

## 2015-03-07 LAB — LACTIC ACID, PLASMA: LACTIC ACID, VENOUS: 1.1 mmol/L (ref 0.5–2.0)

## 2015-03-07 NOTE — Procedures (Signed)
Lumbar Puncture Procedure Note  Pre-operative Diagnosis: Fever, AMS, sepsis of unclear etiology  Post-operative Diagnosis: Fever, AMS, sepsis of unclear etiology  Indications: Diagnostic  Procedure Details   Consent: Informed consent was obtained. Risks of the procedure were discussed including: infection, bleeding, pain and headache.  The patient was positioned under sterile conditions. Betadine solution and sterile drapes were utilized. A spinal needle was inserted at the L4 - L5 interspace.  Spinal fluid was obtained and sent to the laboratory.  Findings 59mL of clear spinal fluid was obtained. Opening Pressure: 23 cm H2O pressure. Closing Pressure: 21 cm H2O pressure.  Complications:  None; patient tolerated the procedure well.        Condition: stable  Plan Bed rest for 3 hours. Tylenol 650 mg for pain. Call office if you develop a severe headache, nausea, vomiting, or fever greater than 100.5 F.

## 2015-03-07 NOTE — Progress Notes (Signed)
[place] Progress Note Patient Name: James Byrd DOB: 08-Jan-1966 MRN: [id number]   Date of Service  03/07/2015  HPI/Events of Note  Camera check on pt - Resting in bed comfortably. Oriented to year, president, & season. Denies any pain.  eICU Interventions  Heart healthy diet. OOB with assistance.     Intervention Category Minor Interventions: Clinical assessment - ordering diagnostic tests;Routine modifications to care plan (e.g. PRN medications for pain, fever)  Tera Partridge 03/07/2015, 7:56 PM

## 2015-03-07 NOTE — H&P (Signed)
PULMONARY / CRITICAL CARE MEDICINE HISTORY AND PHYSICAL EXAMINATION   Name: James Byrd MRN: [id number] DOB: March 28, 1966    ADMISSION DATE:  03/06/2015  PRIMARY SERVICE: PCCM  CHIEF COMPLAINT:  Fevers, dyspnea  BRIEF PATIENT DESCRIPTION: 49 y/o man with melanoma who is transferred to the ICU with worsening sepsis, AMS  SIGNIFICANT EVENTS / STUDIES:  Brought to ED with sudden tachycardia, hypoxia, severe fevers. Unclear what triggered event.  LINES / TUBES: PIVs Foley  CULTURES: Blood Urine Sputum (not collected) CSF  ANTIBIOTICS: Vanc 9/16>> Pip-tazo 9/16>>  HISTORY OF PRESENT ILLNESS:   See H&P for details, but breifly this is a 49 y/o man with hx or ungual melanoma s/p R great toe amputation, chemo for positive inguinal nodes with ipilimumab, which he has tolerated poorly. He seems to have had some pulmonary toxicity and has been treated for "peumonia" twice. He was strated on home O2 (2 LPM) about as week ago when we was seen at OSH.  He was admitted to the general floor with concerns for HCAP, when we developed acute respiratory failure, and a rapid response was called. He was transferred to the ICU for further management.  PAST MEDICAL HISTORY :  Past Medical History  Diagnosis Date  . Paroxysmal supraventricular tachycardia 05/24/2012    Described in the treadmill report; details are pending   . Anxiety 05/24/2012  . Malignant melanoma of right great toe 11/14/2014  . HLD (hyperlipidemia)   . Chronic diastolic congestive heart failure    Past Surgical History  Procedure Laterality Date  . Rotator cuff repair  2010    3 yrs ago  . Rotator cuff repair    . Ablation    . R toe partial ambutation     Prior to Admission medications   Medication Sig Start Date End Date Taking? Authorizing Provider  acetaminophen (TYLENOL) 500 MG tablet Take 500 mg by mouth every 6 (six) hours as needed for mild pain.   Yes Historical Provider, MD  aspirin 81 MG tablet Take 81 mg  by mouth daily.   Yes Historical Provider, MD  diltiazem (TIAZAC) 120 MG 24 hr capsule Take 120 mg by mouth daily.   Yes Historical Provider, MD  escitalopram (LEXAPRO) 10 MG tablet Take 1 tablet (10 mg total) by mouth daily. 03/03/15  Yes [name], MD  Multiple Vitamins-Minerals (MENS ONE DAILY PO) Take 1 tablet by mouth daily.    Yes Historical Provider, MD  pravastatin (PRAVACHOL) 40 MG tablet Take 40 mg by mouth at bedtime.    Yes Historical Provider, MD  predniSONE (DELTASONE) 10 MG tablet Take 5 mg by mouth daily with breakfast.   Yes Historical Provider, MD  NON FORMULARY     Historical Provider, MD  predniSONE (DELTASONE) 20 MG tablet Take 3 tablets (60 mg total) by mouth daily with breakfast. Patient not taking: Reported on 03/06/2015 02/16/15   Shanker Kristeen Mans, MD   No Known Allergies  FAMILY HISTORY:  Family History  Problem Relation Age of Onset  . Stroke Mother   . COPD Father   . Diabetes Father   . Diabetes Sister   . Diabetes Brother    SOCIAL HISTORY:  reports that he has never smoked. He does not have any smokeless tobacco history on file. He reports that he does not drink alcohol or use illicit drugs.  REVIEW OF SYSTEMS:  Cannot assess due to AMS  SUBJECTIVE:   VITAL SIGNS: Temp:  [97.5 F (36.4 C)-104.5 F (40.3  C)] 97.9 F (36.6 C) (09/17 0300) Pulse Rate:  [80-136] 89 (09/17 0300) Resp:  [10-36] 26 (09/17 0300) BP: (94-131)/(55-95) 94/60 mmHg (09/17 0300) SpO2:  [78 %-100 %] 100 % (09/17 0300) FiO2 (%):  [35 %-55 %] 35 % (09/17 0100) Weight:  [256 lb (116.121 kg)] 256 lb (116.121 kg) (09/16 0810) HEMODYNAMICS:   VENTILATOR SETTINGS: Vent Mode:  [-]  FiO2 (%):  [35 %-55 %] 35 % INTAKE / OUTPUT: Intake/Output      09/16 0701 - 09/17 0700   P.O. 0   Total Intake(mL/kg) 0 (0)   Urine (mL/kg/hr) 800   Stool 0   Total Output 800   Net -800       Stool Occurrence 1 x     PHYSICAL EXAMINATION: General:  Cushingoing man lying in bed with  moderate respiratory distress Neuro:  Somnolent, difficult to understand, but generally able to answer questions appropriately. HEENT:  MMM Neck: Great deal of secondary tissues (fat) Cardiovascular:  Heart sounds are dual and normal. Lungs:  Diffuse expiratory wheezing. Abdomen:  Obese, soft. Musculoskeletal:  No joint swelling. Skin:  Mottled rash noted.  LABS:  CBC  Recent Labs Lab 03/06/15 0820 03/06/15 1935 03/07/15 0142  WBC 10.4 7.6 17.9*  HGB 9.4* 9.7* 9.4*  HCT 28.7* 29.8* 28.9*  PLT 133* 108* 149*   Coag's  Recent Labs Lab 03/06/15 1935  APTT 30  INR 1.25   BMET  Recent Labs Lab 03/06/15 0820 03/06/15 1935 03/07/15 0142  NA 141 142 141  K 3.1* 3.5 3.9  CL 104 104 105  CO2 27 29 25   BUN 14 14 18   CREATININE 1.49* 2.24* 2.88*  GLUCOSE 87 99 129*   Electrolytes  Recent Labs Lab 03/06/15 0820 03/06/15 1935 03/07/15 0142  CALCIUM 8.9 8.1* 8.1*  MG  --  1.3*  --   PHOS  --  2.9  --    Sepsis Markers  Recent Labs Lab 03/06/15 1153 03/06/15 1935 03/06/15 2315  LATICACIDVEN 0.41* 2.6* 1.1  PROCALCITON  --  18.85  --    ABG  Recent Labs Lab 03/06/15 1431 03/06/15 1926  PHART 7.408 7.368  PCO2ART 46.4* 45.6*  PO2ART 72.0* 81.0   Liver Enzymes  Recent Labs Lab 03/06/15 0820 03/06/15 1935  AST 12* 25  ALT 19 21  ALKPHOS 48 45  BILITOT 0.8 1.3*  ALBUMIN 3.2* 2.8*   Cardiac Enzymes  Recent Labs Lab 03/06/15 1935 03/07/15 0142  TROPONINI 0.05* 0.04*   Glucose  Recent Labs Lab 03/06/15 2025  GLUCAP 90    Imaging Dg Chest 2 View  03/06/2015   CLINICAL DATA:  Fever and shortness of breath for 1 day  EXAM: CHEST  2 VIEW  COMPARISON:  February 14, 2015  FINDINGS: There has been interval clearing of patchy infiltrate from the lung bases. There is mild bibasilar atelectasis currently. No new opacity. Heart is upper normal in size with pulmonary vascularity within normal limits. No adenopathy. No bone lesions.  IMPRESSION:  Mild bibasilar atelectasis. Interval significant clearing for the bases compared to prior study. No new opacity. No change in cardiac silhouette.   Electronically Signed   By: Lowella Grip III M.D.   On: 03/06/2015 09:00   Ct Angio Chest Pe W/cm &/or Wo Cm  03/06/2015   CLINICAL DATA:  Shortness of breath x4 weeks, fever, recent pneumonia diagnosis  EXAM: CT ANGIOGRAPHY CHEST WITH CONTRAST  TECHNIQUE: Multidetector CT imaging of the chest was performed using the  standard protocol during bolus administration of intravenous contrast. Multiplanar CT image reconstructions and MIPs were obtained to evaluate the vascular anatomy.  CONTRAST:  119mL OMNIPAQUE IOHEXOL 350 MG/ML SOLN  COMPARISON:  02/09/2015  FINDINGS: Right arm IV contrast injection. SVC patent. RV is nondilated. Dilated central pulmonary arteries suggesting pulmonary hypertension. Satisfactory opacification of pulmonary arteries noted, and there is no evidence of pulmonary emboli. Motion during the acquisition degrades some of the images through the bases. Patent pulmonary veins bilaterally. Adequate contrast opacification of the thoracic aorta with no dissection, aneurysm, or stenosis. There is classic 3-vessel brachiocephalic arch anatomy without proximal stenosis.  No pleural or pericardial effusion. No pneumothorax. No hilar or mediastinal adenopathy. Patchy airspace consolidation in the posterior segments of both upper lobes, increased on the right since previous exam. Partial improvement in the airspace consolidation in the lower lobes since the earlier study, with some residual consolidation/ scarring/ atelectasis posteriorly right greater than left. Thoracic spine and sternum intact. 2 cm probable cyst in hepatic segment 4. Other smaller nonspecific low-attenuation liver lesions. Remainder visualized upper abdomen unremarkable.  Review of the MIP images confirms the above findings.  IMPRESSION: 1. Negative for acute PE. 2. Persistent  bilateral patchy airspace disease, partially improved in the lung bases since previous exam, but worse in the posterior right upper lobe.   Electronically Signed   By: Lucrezia Europe M.D.   On: 03/06/2015 12:20   Dg Chest Port 1 View  03/06/2015   CLINICAL DATA:  Difficulty breathing and chest pain as well as body aches today. History of paroxysmal supraventricular tachycardia.  EXAM: PORTABLE CHEST - 1 VIEW  COMPARISON:  03/06/2015 at 8:39 a.m.  FINDINGS: Lung volumes are low accentuating the bronchovascular structures. There is mild hazy opacity that projects the right upper lobe which may reflect atelectasis. This could potentially reflect a small pneumonia. Remainder of the lungs is clear.  No pleural effusion or pneumothorax.  Cardiac silhouette is normal in size and configuration. No mediastinal or hilar masses or convincing adenopathy.  IMPRESSION: 1. Possible small pneumonia versus atelectasis in the right upper lobe. No other evidence of acute cardiopulmonary disease.   Electronically Signed   By: Lajean Manes M.D.   On: 03/06/2015 20:12   Dg Abd Portable 1v  03/06/2015   CLINICAL DATA:  Abdominal distention and lower abdomen pains  EXAM: PORTABLE ABDOMEN - 1 VIEW  COMPARISON:  None.  FINDINGS: The bowel gas pattern is normal. No radio-opaque calculi or other significant radiographic abnormality are seen.  IMPRESSION: Negative.   Electronically Signed   By: Skipper Cliche M.D.   On: 03/06/2015 20:11    EKG: Normal CXR: Normal parenchyma.  ASSESSMENT / PLAN:  Active Problems:   Anxiety   Obstructive sleep apnea   Malignant melanoma of right great toe Stage IIIB [id number])   Hypokalemia   HLD (hyperlipidemia)   Chronic diastolic congestive heart failure   Thrombocytopenia   Acute respiratory failure with hypoxia   HCAP (healthcare-associated pneumonia)   PULMONARY A: Hypoxic respiratory failure P:   Appears to have subacute process, possibly related to chemo, but imaging does not  appear to be due to opportunistic infection. Had sudden event on floor, may be PE. Unfortunately had CT-PA earlier in the day with tenouous renal function. Appears to be improved spontaneously.  CARDIOVASCULAR A: Tachycardia, improved P:   May be driven by fever. Will monitor.  RENAL A: AKI Acute lactic acidosis P:   Will treat with IVF, trend  GASTROINTESTINAL A: No active issues P:   none  HEMATOLOGIC A: Acute lekocytosis Hx of Melanoma P:   Likely due to stress of acute event, possible aspiration. Etc. Discuss with oncology tomorrow.  INFECTIOUS A: Sepsis of unclear etiology P:   Cultures pending. LP in process this evening given AMS and immunocompromised state.  ENDOCRINE A: No active issues P:   N/A  NEUROLOGIC A: Altered Mental Status P:   Due to sepsis, likely. May consider MRI brain to assess for brain involvement of malignancy. Discuss with onc  BEST PRACTICE / DISPOSITION Level of Care:  ICU Primary Service:  PCCM Consultants:  None Code Status:  Full Diet:  NPO DVT Px:  Heparin GI Px:  None Skin Integrity:  Intact Social / Family:  Updated  TODAY'S SUMMARY: 49 y/o man with melanoma with sepsis of unclear etiology.  I have personally obtained a history, examined the patient, evaluated laboratory and imaging results, formulated the assessment and plan and placed orders.  CRITICAL CARE: The patient is critically ill with multiple organ systems failure and requires high complexity decision making for assessment and support, frequent evaluation and titration of therapies, application of advanced monitoring technologies and extensive interpretation of multiple databases. Critical Care Time devoted to patient care services described in this note is 95 minutes.   Luz Brazen, MD [facility] Pager: [phone]   03/07/2015, 3:48 AM

## 2015-03-08 LAB — CBC
HCT: 25.2 % — ABNORMAL LOW (ref 39.0–52.0)
HEMOGLOBIN: 8.2 g/dL — AB (ref 13.0–17.0)
MCH: 30.4 pg (ref 26.0–34.0)
MCHC: 32.5 g/dL (ref 30.0–36.0)
MCV: 93.3 fL (ref 78.0–100.0)
Platelets: 168 10*3/uL (ref 150–400)
RBC: 2.7 MIL/uL — AB (ref 4.22–5.81)
RDW: 17 % — ABNORMAL HIGH (ref 11.5–15.5)
WBC: 11.9 10*3/uL — ABNORMAL HIGH (ref 4.0–10.5)

## 2015-03-08 LAB — EXPECTORATED SPUTUM ASSESSMENT W REFEX TO RESP CULTURE

## 2015-03-08 LAB — BASIC METABOLIC PANEL
Anion gap: 9 (ref 5–15)
BUN: 32 mg/dL — AB (ref 6–20)
CHLORIDE: 108 mmol/L (ref 101–111)
CO2: 26 mmol/L (ref 22–32)
CREATININE: 2.82 mg/dL — AB (ref 0.61–1.24)
Calcium: 8.1 mg/dL — ABNORMAL LOW (ref 8.9–10.3)
GFR calc Af Amer: 29 mL/min — ABNORMAL LOW (ref >60)
GFR calc non Af Amer: 25 mL/min — ABNORMAL LOW (ref >60)
GLUCOSE: 110 mg/dL — AB (ref 65–99)
POTASSIUM: 3.4 mmol/L — AB (ref 3.5–5.1)
SODIUM: 143 mmol/L (ref 135–145)

## 2015-03-08 LAB — URINE CULTURE: Culture: 30000

## 2015-03-08 LAB — PROCALCITONIN: Procalcitonin: 40.68 ng/mL

## 2015-03-08 LAB — EXPECTORATED SPUTUM ASSESSMENT W GRAM STAIN, RFLX TO RESP C

## 2015-03-08 NOTE — Progress Notes (Signed)
PULMONARY / CRITICAL CARE MEDICINE   Name: James Byrd MRN: [id number] DOB: [birth year]    ADMISSION DATE:  03/06/2015 CONSULTATION DATE:  9/17  REFERRING MD :  Karleen Hampshire ([facility])   CHIEF COMPLAINT:  sepsis  INITIAL PRESENTATION: 49 y/o male with hx melanoma on current chemo and ?pulmonary toxicity recently started on home O2.  Presented 9/16 with fevers and admitted with ?HCAP and sepsis.  On 9/17 rapid response called r/t worsening respiratory failure and AMS.    STUDIES:  CTA chest 9/16>>> NEG PE, persistent bilat patchy asd, worse RUL abd u/s 9/17>>> 1. No cholelithiasis or sonographic evidence of acute cholecystitis. 2. Mild increased renal cortical echogenicity as can be seen with medical renal disease versus acute kidney injury. LP 9/17>>>  SIGNIFICANT EVENTS: 9/17 tx ICU   SUBJECTIVE:   Feeling much better.  Sitting OOB in chair.  Denies SOB at rest.   VITAL SIGNS: Temp:  [98.4 F (36.9 C)-100.3 F (37.9 C)] 98.4 F (36.9 C) (09/18 0906) Pulse Rate:  [74-101] 98 (09/18 0355) Resp:  [15-26] 21 (09/18 0355) BP: (87-123)/(56-80) 123/69 mmHg (09/18 0355) SpO2:  [93 %-99 %] 99 % (09/18 0355) HEMODYNAMICS:   VENTILATOR SETTINGS:   INTAKE / OUTPUT:  Intake/Output Summary (Last 24 hours) at 03/08/15 1131 Last data filed at 03/08/15 0540  Gross per 24 hour  Intake   2050 ml  Output   1825 ml  Net    225 ml    PHYSICAL EXAMINATION: General:  Pleasant, chronically ill appearing male, NAD  Neuro:  Awake, alert, appropriate, MAE  HEENT:  Mm moist, no JVD  Cardiovascular:  s1s2 rrr, mild tachy 105  Lungs:  resps even non labored on 4L [place], diminished bases, few scattered rhonchi  Abdomen:  Round, soft, non tender, +bs  Musculoskeletal:  Warm and dry, no edema   LABS:  CBC  Recent Labs Lab 03/06/15 0820 03/06/15 1935 03/07/15 0142  WBC 10.4 7.6 17.9*  HGB 9.4* 9.7* 9.4*  HCT 28.7* 29.8* 28.9*  PLT 133* 108* 149*   Coag's  Recent Labs Lab 03/06/15 1935   APTT 30  INR 1.25   BMET  Recent Labs Lab 03/06/15 0820 03/06/15 1935 03/07/15 0142  NA 141 142 141  K 3.1* 3.5 3.9  CL 104 104 105  CO2 27 29 25   BUN 14 14 18   CREATININE 1.49* 2.24* 2.88*  GLUCOSE 87 99 129*   Electrolytes  Recent Labs Lab 03/06/15 0820 03/06/15 1935 03/07/15 0142  CALCIUM 8.9 8.1* 8.1*  MG  --  1.3*  --   PHOS  --  2.9  --    Sepsis Markers  Recent Labs Lab 03/06/15 1153 03/06/15 1935 03/06/15 2315 03/07/15 1044 03/08/15 0310  LATICACIDVEN 0.41* 2.6* 1.1  --   --   PROCALCITON  --  18.85  --  80.25 40.68   ABG  Recent Labs Lab 03/06/15 1431 03/06/15 1926  PHART 7.408 7.368  PCO2ART 46.4* 45.6*  PO2ART 72.0* 81.0   Liver Enzymes  Recent Labs Lab 03/06/15 0820 03/06/15 1935  AST 12* 25  ALT 19 21  ALKPHOS 48 45  BILITOT 0.8 1.3*  ALBUMIN 3.2* 2.8*   Cardiac Enzymes  Recent Labs Lab 03/06/15 1935 03/07/15 0142 03/07/15 1044  TROPONINI 0.05* 0.04* <0.03   Glucose  Recent Labs Lab 03/06/15 2025  GLUCAP 90    Imaging No results found.   ASSESSMENT / PLAN:  PULMONARY Acute hypoxic respiratory failure  Hx OSA  ?HCAP +/-  pneumonitis; clinical picture and cause of his infiltrates and fever unclear. Has been treated for both PNA and inflammatory pneumonitis.  P:   Continue supplemental O2 - titrate as able to keep sats >92%  F/u CXR  Steroid taper completed   CARDIOVASCULAR Tachycardia - improved  P:  Cont home diltiazem   RENAL AKI - r/t hypotension and contrast  Acute lactic acidosis - improved  P:   Gentle volume  F/u chem now to ensure Scr trending down Pharmacy dosing vanc   GASTROINTESTINAL No active issue  P:   Po diet  Some previous concern for aspiration but cleared by SLP last admit   HEMATOLOGIC Acute lekocytosis Hx of Melanoma - completed 3 cycles chemo  P:  Will let oncology know he is admitted (Ennever)  Hold chemo   INFECTIOUS ?HCAP  UTI (?contaminant, asymptomatic)   Sepsis - unclear etiology.  Doubt from UTI.  LP done given AMS and immunocompromised.   P:   BCx2 9/17>>> UC 9/17>>> coag neg staph>> RES cipro  CSF 9/17>>>  Vanc 9/16>> Pip-tazo 9/16>>  Follow cultures and wean as able   ENDOCRINE No active issue    P:     NEUROLOGIC AMS - Resolved.  Suspect r/t fever, hypoxia P:   Consider MRI brain if further AMS to r/o involvement of malignancy - d/w oncology    FAMILY  - Updates:  Pt and wife updated 9/18 at bedside.    Will tx back to tele, back to [facility] as of 9/19   Nickolas Madrid, NP 03/08/2015  11:31 AM Pager: [phone] or [phone]   Attending Note:  I have examined patient, reviewed labs, studies and notes. I have discussed the case with Shon Millet, and I agree with the data and plans as amended above.   Baltazar Apo, MD, PhD 03/08/2015, 2:49 PM [facility] [phone] or if no answer [phone]

## 2015-03-09 DIAGNOSIS — E876 Hypokalemia: Secondary | ICD-10-CM

## 2015-03-09 DIAGNOSIS — J0120 Acute ethmoidal sinusitis, unspecified: Secondary | ICD-10-CM

## 2015-03-09 DIAGNOSIS — J9621 Acute and chronic respiratory failure with hypoxia: Secondary | ICD-10-CM

## 2015-03-09 DIAGNOSIS — N179 Acute kidney failure, unspecified: Secondary | ICD-10-CM

## 2015-03-09 LAB — BASIC METABOLIC PANEL
Anion gap: 9 (ref 5–15)
BUN: 27 mg/dL — AB (ref 6–20)
CALCIUM: 8.3 mg/dL — AB (ref 8.9–10.3)
CHLORIDE: 110 mmol/L (ref 101–111)
CO2: 24 mmol/L (ref 22–32)
CREATININE: 2.76 mg/dL — AB (ref 0.61–1.24)
GFR calc Af Amer: 30 mL/min — ABNORMAL LOW (ref >60)
GFR calc non Af Amer: 26 mL/min — ABNORMAL LOW (ref >60)
GLUCOSE: 81 mg/dL (ref 65–99)
Potassium: 3.3 mmol/L — ABNORMAL LOW (ref 3.5–5.1)
Sodium: 143 mmol/L (ref 135–145)

## 2015-03-09 LAB — LEGIONELLA ANTIGEN, URINE

## 2015-03-09 MED ORDER — POTASSIUM CHLORIDE CRYS ER 20 MEQ PO TBCR
40.0000 meq | EXTENDED_RELEASE_TABLET | Freq: Once | ORAL | Status: AC
  Administered 2015-03-09: 40 meq via ORAL
  Filled 2015-03-09: qty 2

## 2015-03-09 NOTE — Progress Notes (Signed)
Patient transferred to 3S room 14. Sent with 1 bag of clothing and a cell phone.

## 2015-03-09 NOTE — Progress Notes (Signed)
James Byrd was moved to the ICU over the weekend. I suppose he was having more in the way of mental status issues.  He had a lumbar puncture done. Cultures and cytology is pending.  He looks much better today.  He is more alert. He is quite conversant. To me, he is close to his baseline mental status.  I did check his TSH. It is a little on the higher side. It is still normal but it is higher than what it used to be. As such, I really think that his thyroid and had to be watched closely.  He continues on IV antibiotics.  He had an ultrasound of his abdomen. This showed a hepatic cyst.  On his physical exam, he is afebrile. Blood pressure 126/75. Oxygen saturation room air is 92%. His lungs sounded pretty clear. No wheezes were noted. Cardiac exam regular rate and rhythm with no murmurs, rubs or bruits. Abdomen is soft. Extremity shows no clubbing, cyanosis or edema.  Aagain, he appears to be improving. I don't suspect any issues with the melanoma that he had. I suppose that he still might be having some toxicity from the [facility]. Again he has not had this now for about 6-7 weeks.  I do worry about his thyroid. I will have him make sure his TSH is checked in about a month..  It sounds like he will be moved out of the ICU today.  Harriette Ohara 17:14

## 2015-03-09 NOTE — Progress Notes (Signed)
ANTIBIOTIC CONSULT NOTE - INITIAL  Pharmacy Consult for vancomycin Indication: pneumonia  No Known Allergies  Patient Measurements: Height: 6' (182.9 cm) Weight: 256 lb (116.121 kg) IBW/kg (Calculated) : 77.6  Vital Signs: Temp: 98.9 F (37.2 C) (09/19 0715) Temp Source: Oral (09/19 0715) BP: 126/75 mmHg (09/19 0645) Pulse Rate: 99 (09/19 0645) Intake/Output from previous day: 09/18 0701 - 09/19 0700 In: 1862.5 [P.O.:880; I.V.:605; IV Piggyback:337.5] Out: 2075 [Urine:2075] Intake/Output from this shift: Total I/O In: 360 [P.O.:360] Out: 400 [Urine:400]  Labs:  Recent Labs  03/06/15 1935 03/07/15 0142 03/08/15 1320 03/09/15 0224  WBC 7.6 17.9* 11.9*  --   HGB 9.7* 9.4* 8.2*  --   PLT 108* 149* 168  --   CREATININE 2.24* 2.88* 2.82* 2.76*   Estimated Creatinine Clearance: 43.1 mL/min (by C-G formula based on Cr of 2.76). No results for input(s): VANCOTROUGH, VANCOPEAK, VANCORANDOM, GENTTROUGH, GENTPEAK, GENTRANDOM, TOBRATROUGH, TOBRAPEAK, TOBRARND, AMIKACINPEAK, AMIKACINTROU, AMIKACIN in the last 72 hours.   Microbiology: Recent Results (from the past 720 hour(s))  Culture, blood (routine x 2)     Status: None   Collection Time: 02/09/15 11:11 AM  Result Value Ref Range Status   Specimen Description BLOOD RIGHT FOREARM  Final   Special Requests BOTTLES DRAWN AEROBIC AND ANAEROBIC 5CC  Final   Culture   Final    NO GROWTH 5 DAYS Performed at [facility]    Report Status 02/14/2015 FINAL  Final  Culture, blood (routine x 2)     Status: None   Collection Time: 02/09/15 11:23 AM  Result Value Ref Range Status   Specimen Description BLOOD RIGHT HAND  Final   Special Requests BOTTLES DRAWN AEROBIC AND ANAEROBIC 4.5CC EACH  Final   Culture   Final    NO GROWTH 5 DAYS Performed at [facility]    Report Status 02/14/2015 FINAL  Final  Culture, expectorated sputum-assessment     Status: None   Collection Time: 02/12/15  7:36 AM  Result Value Ref  Range Status   Specimen Description EXPECTORATED SPUTUM  Final   Special Requests NONE  Final   Sputum evaluation   Final    MICROSCOPIC FINDINGS SUGGEST THAT THIS SPECIMEN IS NOT REPRESENTATIVE OF LOWER RESPIRATORY SECRETIONS. PLEASE RECOLLECT. Results Called to: Olin Pia AT [id number] 02/12/15 BY L BENFIELD    Report Status 02/12/2015 FINAL  Final  Urine culture     Status: None   Collection Time: 03/06/15  9:28 AM  Result Value Ref Range Status   Specimen Description URINE, CLEAN CATCH  Final   Special Requests NONE  Final   Culture   Final    30,000 COLONIES/mL STAPHYLOCOCCUS SPECIES (COAGULASE NEGATIVE)   Report Status 03/08/2015 FINAL  Final   Organism ID, Bacteria STAPHYLOCOCCUS SPECIES (COAGULASE NEGATIVE)  Final      Susceptibility   Staphylococcus species (coagulase negative) - MIC*    CIPROFLOXACIN >=8 RESISTANT Resistant     GENTAMICIN <=0.5 SENSITIVE Sensitive     NITROFURANTOIN <=16 SENSITIVE Sensitive     OXACILLIN >=4 RESISTANT Resistant     TETRACYCLINE <=1 SENSITIVE Sensitive     VANCOMYCIN <=0.5 SENSITIVE Sensitive     TRIMETH/SULFA <=10 SENSITIVE Sensitive     CLINDAMYCIN <=0.25 SENSITIVE Sensitive     RIFAMPIN <=0.5 SENSITIVE Sensitive     Inducible Clindamycin NEGATIVE Sensitive     * 30,000 COLONIES/mL STAPHYLOCOCCUS SPECIES (COAGULASE NEGATIVE)  Culture, blood (routine x 2)     Status: None (Preliminary result)  Collection Time: 03/06/15  7:35 PM  Result Value Ref Range Status   Specimen Description BLOOD LEFT ANTECUBITAL  Final   Special Requests BOTTLES DRAWN AEROBIC AND ANAEROBIC 5CC   Final   Culture NO GROWTH 2 DAYS  Final   Report Status PENDING  Incomplete  Culture, blood (routine x 2)     Status: None (Preliminary result)   Collection Time: 03/06/15  7:53 PM  Result Value Ref Range Status   Specimen Description BLOOD LEFT ANTECUBITAL  Final   Special Requests BOTTLES DRAWN AEROBIC ONLY 5CC  Final   Culture NO GROWTH 2 DAYS  Final   Report  Status PENDING  Incomplete  MRSA PCR Screening     Status: None   Collection Time: 03/06/15 10:33 PM  Result Value Ref Range Status   MRSA by PCR NEGATIVE NEGATIVE Final    Comment:        The GeneXpert MRSA Assay (FDA approved for NASAL specimens only), is one component of a comprehensive MRSA colonization surveillance program. It is not intended to diagnose MRSA infection nor to guide or monitor treatment for MRSA infections.   CSF culture     Status: None (Preliminary result)   Collection Time: 03/07/15  1:20 AM  Result Value Ref Range Status   Specimen Description CSF  Final   Special Requests Immunocompromised  Final   Gram Stain   Final    CYTOSPIN SMEAR WBC PRESENT, PREDOMINANTLY PMN NO ORGANISMS SEEN    Culture NO GROWTH 2 DAYS  Final   Report Status PENDING  Incomplete  Fungus Culture with Smear     Status: None (Preliminary result)   Collection Time: 03/07/15  1:20 AM  Result Value Ref Range Status   Specimen Description CSF  Final   Special Requests NONE  Final   Fungal Smear   Final    NO YEAST OR FUNGAL ELEMENTS SEEN Performed at [facility]    Culture   Final    CULTURE IN PROGRESS FOR FOUR WEEKS Performed at [facility]    Report Status PENDING  Incomplete  Culture, expectorated sputum-assessment     Status: None   Collection Time: 03/08/15  8:10 PM  Result Value Ref Range Status   Specimen Description SPUTUM  Final   Special Requests NONE  Final   Sputum evaluation   Final    THIS SPECIMEN IS ACCEPTABLE. RESPIRATORY CULTURE REPORT TO FOLLOW.   Report Status 03/08/2015 FINAL  Final    Medical History: Past Medical History  Diagnosis Date  . Paroxysmal supraventricular tachycardia 05/24/2012    Described in the treadmill report; details are pending   . Anxiety 05/24/2012  . Malignant melanoma of right great toe 11/14/2014  . HLD (hyperlipidemia)   . Chronic diastolic congestive heart failure     Assessment: 33 yom presenting  with fever, emesis, SOB, cough. PMH of malignant melanoma s/p Yervoy. Recent admission to [facility] for suspected pna, diagnosed with medication-induced pneumonitis secondary to [facility]. Not on abx at home. Also admitted to [facility] in between for 3 days. Pharmacy consulted to dose vancomycin for HCAP. Also on Zosyn per MD. WBC 11.9, afebrile. SCr 1.49 on admit, now 2.76.  9/16 vanc>> 9/16 zosyn>>  9/16 UC>> Coag-negative staph 9/16 MRSA PCR>> negative 9/16 blood x 2>> NGTD 9/17 CSF >> NGTD    Goal of Therapy:  Vancomycin trough level 15-20 mcg/ml  Plan:  - Continue Vanc 1250mg  IV q24h - Continue Zosyn 3.375g IV q8h -  Monitor clinical progress, c/s, abx plan - VT@SS  as indicated, likely tomorrow if vanc is to be continued - Monitor renal function closely - may need to adjust vanc   Nicoletta Ba, PharmD, BCPS Clinical Pharmacist [phone]

## 2015-03-09 NOTE — Progress Notes (Addendum)
eLink Physician-Brief Progress Note Patient Name: James Byrd DOB: 1965-11-27 MRN: [id number]   Date of Service  03/09/2015  HPI/Events of Note   Recent Labs Lab 03/06/15 0820 03/06/15 1935 03/07/15 0142 03/08/15 1320 03/09/15 0224  K 3.1* 3.5 3.9 3.4* 3.3*   Estimated Creatinine Clearance: 43.1 mL/min (by C-G formula based on Cr of 2.76).    Recent Labs Lab 03/06/15 0820 03/06/15 1935 03/07/15 0142 03/08/15 1320 03/09/15 0224  CREATININE 1.49* 2.24* 2.88* 2.82* 2.76*     I/O last 3 completed shifts: In: 3802.5 [P.O.:880; I.V.:2235; IV Piggyback:687.5] Out: 2800 [Urine:2800]   eICU Interventions  Po kcl 63meq x 1     Intervention Category Intermediate Interventions: Electrolyte abnormality - evaluation and management  [name] 03/09/2015, 5:34 AM

## 2015-03-09 NOTE — Progress Notes (Addendum)
[place] TEAM 1 - Stepdown/ICU TEAM PROGRESS NOTE  James Byrd [id number] DOB: 22-Aug-1965 DOA: 03/06/2015 PCP: Ann Held, MD  Admit HPI / Brief Narrative: 49 y/o male with hx melanoma on current chemo and ?pulmonary toxicity recently started on home O2. Presented 9/16 with fevers and admitted with ?HCAP and sepsis. On 9/17 rapid response called r/t worsening respiratory failure and AMS.   Significant Events: 9/16 CTA chest > NEG PE, persistent bilat patchy asd, worse RUL 9/17 tx ICU  9/17 abd u/s > No cholelithiasis or sonographic evidence of acute cholecystitis. Mild increased renal cortical echogenicity as can be seen with medical renal disease versus acute kidney injury. 9/17 LP 9/17 >  HPI/Subjective: The patient is alert and conversant but is very slow to answer some very simple orientation questions.  There clearly is a lag between his comprehension and response.  He denies specific complaints but I'm not convinced his history is reliable at this time.  Assessment/Plan:  Acute hypoxic respiratory failure  Appears essentially resolved at this time  Sepsis unclear etiology - doubt from UTI - LP done given AMS and immunocompromised but findings thus far unremarkable  ?HCAP +/- pneumonitis clinical picture and cause of his infiltrates and fever unclear - treated for both PNA and inflammatory pneumonitis per PCCM  AMS Though by history the patient appears to have improved it seems to me that he is clearly not back to his baseline - given his history of melanoma I found this a bit concerning - his oncologist evaluated him today and feels that he is improving - I will hold on brain imaging for now but if his mental status does not continue to improve we will consider MRI brain  Tachycardia resolved   Acute lactic acidosis Resolved   AKI r/t hypotension and contrast - Creatinine is very slowly improving  Hx of Melanoma - completed 3 cycles chemo  Evaluated by  his oncologist today  Acute lekocytosis Appears to be slowly normalizing  Hx OSA  Pt has not had a formal sleep study per his wife, and therefore has not been able to get CPAP for use at home - will ask CM to help arrange outpt sleep study appointment prior to d/c   UTI (?contaminant, asymptomatic)  Given low colony count doubt coag negative staph identified in urine is a pathogen   Code Status: FULL Family Communication: no family present at time of exam Disposition Plan: SDU  Consultants: Oncology  PCCM   Antibiotics: Zosyn 9/16 > Vancomycin 9/16 >  DVT prophylaxis: Lovenox  Objective: Blood pressure 126/75, pulse 99, temperature 98.9 F (37.2 C), temperature source Oral, resp. rate 20, height 6' (1.829 m), weight 116.121 kg (256 lb), SpO2 92 %.  Intake/Output Summary (Last 24 hours) at 03/09/15 1149 Last data filed at 03/09/15 0935  Gross per 24 hour  Intake 1682.5 ml  Output   2175 ml  Net -492.5 ml   Exam: General: No acute respiratory distress - alert but lethargic  Lungs: Clear to auscultation bilaterally without wheezes or crackles Cardiovascular: Regular rate and rhythm without murmur gallop or rub normal S1 and S2 Abdomen: Nontender, nondistended, soft, bowel sounds positive, no rebound, no ascites, no appreciable mass Extremities: No significant cyanosis, clubbing, or edema bilateral lower extremities  Data Reviewed: Basic Metabolic Panel:  Recent Labs Lab 03/06/15 0820 03/06/15 1935 03/07/15 0142 03/08/15 1320 03/09/15 0224  NA 141 142 141 143 143  K 3.1* 3.5 3.9 3.4* 3.3*  CL 104 104 105  108 110  CO2 27 29 25 26 24   GLUCOSE 87 99 129* 110* 81  BUN 14 14 18  32* 27*  CREATININE 1.49* 2.24* 2.88* 2.82* 2.76*  CALCIUM 8.9 8.1* 8.1* 8.1* 8.3*  MG  --  1.3*  --   --   --   PHOS  --  2.9  --   --   --     CBC:  Recent Labs Lab 03/06/15 0820 03/06/15 1935 03/07/15 0142 03/08/15 1320  WBC 10.4 7.6 17.9* 11.9*  NEUTROABS 8.5* 7.2  --    --   HGB 9.4* 9.7* 9.4* 8.2*  HCT 28.7* 29.8* 28.9* 25.2*  MCV 93.5 95.8 95.1 93.3  PLT 133* 108* 149* 168    Liver Function Tests:  Recent Labs Lab 03/06/15 0820 03/06/15 1935  AST 12* 25  ALT 19 21  ALKPHOS 48 45  BILITOT 0.8 1.3*  PROT 6.1* 5.4*  ALBUMIN 3.2* 2.8*    Recent Labs Lab 03/06/15 0820 03/06/15 1935  LIPASE 22 25  AMYLASE  --  43   Coags:  Recent Labs Lab 03/06/15 1935  INR 1.25    Recent Labs Lab 03/06/15 1935  APTT 30    Cardiac Enzymes:  Recent Labs Lab 03/06/15 1935 03/07/15 0142 03/07/15 1044  TROPONINI 0.05* 0.04* <0.03    CBG:  Recent Labs Lab 03/06/15 2025  GLUCAP 90    Recent Results (from the past 240 hour(s))  Urine culture     Status: None   Collection Time: 03/06/15  9:28 AM  Result Value Ref Range Status   Specimen Description URINE, CLEAN CATCH  Final   Special Requests NONE  Final   Culture   Final    30,000 COLONIES/mL STAPHYLOCOCCUS SPECIES (COAGULASE NEGATIVE)   Report Status 03/08/2015 FINAL  Final   Organism ID, Bacteria STAPHYLOCOCCUS SPECIES (COAGULASE NEGATIVE)  Final      Susceptibility   Staphylococcus species (coagulase negative) - MIC*    CIPROFLOXACIN >=8 RESISTANT Resistant     GENTAMICIN <=0.5 SENSITIVE Sensitive     NITROFURANTOIN <=16 SENSITIVE Sensitive     OXACILLIN >=4 RESISTANT Resistant     TETRACYCLINE <=1 SENSITIVE Sensitive     VANCOMYCIN <=0.5 SENSITIVE Sensitive     TRIMETH/SULFA <=10 SENSITIVE Sensitive     CLINDAMYCIN <=0.25 SENSITIVE Sensitive     RIFAMPIN <=0.5 SENSITIVE Sensitive     Inducible Clindamycin NEGATIVE Sensitive     * 30,000 COLONIES/mL STAPHYLOCOCCUS SPECIES (COAGULASE NEGATIVE)  Culture, blood (routine x 2)     Status: None (Preliminary result)   Collection Time: 03/06/15  7:35 PM  Result Value Ref Range Status   Specimen Description BLOOD LEFT ANTECUBITAL  Final   Special Requests BOTTLES DRAWN AEROBIC AND ANAEROBIC 5CC   Final   Culture NO GROWTH 2 DAYS   Final   Report Status PENDING  Incomplete  Culture, blood (routine x 2)     Status: None (Preliminary result)   Collection Time: 03/06/15  7:53 PM  Result Value Ref Range Status   Specimen Description BLOOD LEFT ANTECUBITAL  Final   Special Requests BOTTLES DRAWN AEROBIC ONLY 5CC  Final   Culture NO GROWTH 2 DAYS  Final   Report Status PENDING  Incomplete  MRSA PCR Screening     Status: None   Collection Time: 03/06/15 10:33 PM  Result Value Ref Range Status   MRSA by PCR NEGATIVE NEGATIVE Final    Comment:        The GeneXpert  MRSA Assay (FDA approved for NASAL specimens only), is one component of a comprehensive MRSA colonization surveillance program. It is not intended to diagnose MRSA infection nor to guide or monitor treatment for MRSA infections.   CSF culture     Status: None (Preliminary result)   Collection Time: 03/07/15  1:20 AM  Result Value Ref Range Status   Specimen Description CSF  Final   Special Requests Immunocompromised  Final   Gram Stain   Final    CYTOSPIN SMEAR WBC PRESENT, PREDOMINANTLY PMN NO ORGANISMS SEEN    Culture NO GROWTH 2 DAYS  Final   Report Status PENDING  Incomplete  Fungus Culture with Smear     Status: None (Preliminary result)   Collection Time: 03/07/15  1:20 AM  Result Value Ref Range Status   Specimen Description CSF  Final   Special Requests NONE  Final   Fungal Smear   Final    NO YEAST OR FUNGAL ELEMENTS SEEN Performed at [facility]    Culture   Final    CULTURE IN PROGRESS FOR FOUR WEEKS Performed at [facility]    Report Status PENDING  Incomplete  Culture, expectorated sputum-assessment     Status: None   Collection Time: 03/08/15  8:10 PM  Result Value Ref Range Status   Specimen Description SPUTUM  Final   Special Requests NONE  Final   Sputum evaluation   Final    THIS SPECIMEN IS ACCEPTABLE. RESPIRATORY CULTURE REPORT TO FOLLOW.   Report Status 03/08/2015 FINAL  Final     Studies:     Recent x-ray studies have been reviewed in detail by the Attending Physician  Scheduled Meds:  Scheduled Meds: . antiseptic oral rinse  7 mL Mouth Rinse BID  . aspirin EC  81 mg Oral Daily  . diltiazem  120 mg Oral Daily  . enoxaparin (LOVENOX) injection  40 mg Subcutaneous Q24H  . escitalopram  10 mg Oral Daily  . piperacillin-tazobactam (ZOSYN)  IV  3.375 g Intravenous 3 times per day  . vancomycin  1,250 mg Intravenous Q24H    Time spent on care of this patient: 35 mins   MCCLUNG,JEFFREY T , MD   [facility] Office  [phone] Pager - Text Page per Shea Evans as per below:  On-Call/Text Page:      [email or web address]      password [email or web address]  If 7PM-7AM, please contact night-coverage [email or web address] Password [email or web address] 03/09/2015, 11:49 AM   LOS: 3 days

## 2015-03-10 ENCOUNTER — Inpatient Hospital Stay (HOSPITAL_COMMUNITY): Payer: BLUE CROSS/BLUE SHIELD

## 2015-03-10 DIAGNOSIS — IMO0001 Reserved for inherently not codable concepts without codable children: Secondary | ICD-10-CM | POA: Diagnosis present

## 2015-03-10 DIAGNOSIS — N179 Acute kidney failure, unspecified: Secondary | ICD-10-CM | POA: Diagnosis present

## 2015-03-10 DIAGNOSIS — E872 Acidosis, unspecified: Secondary | ICD-10-CM | POA: Diagnosis present

## 2015-03-10 DIAGNOSIS — J9621 Acute and chronic respiratory failure with hypoxia: Secondary | ICD-10-CM | POA: Diagnosis present

## 2015-03-10 DIAGNOSIS — C439 Malignant melanoma of skin, unspecified: Secondary | ICD-10-CM

## 2015-03-10 DIAGNOSIS — D72829 Elevated white blood cell count, unspecified: Secondary | ICD-10-CM

## 2015-03-10 DIAGNOSIS — Z9989 Dependence on other enabling machines and devices: Secondary | ICD-10-CM

## 2015-03-10 DIAGNOSIS — G4733 Obstructive sleep apnea (adult) (pediatric): Secondary | ICD-10-CM | POA: Diagnosis present

## 2015-03-10 DIAGNOSIS — I5032 Chronic diastolic (congestive) heart failure: Secondary | ICD-10-CM | POA: Diagnosis present

## 2015-03-10 DIAGNOSIS — J189 Pneumonia, unspecified organism: Secondary | ICD-10-CM | POA: Diagnosis present

## 2015-03-10 LAB — CBC
HEMATOCRIT: 25.7 % — AB (ref 39.0–52.0)
HEMOGLOBIN: 8.2 g/dL — AB (ref 13.0–17.0)
MCH: 29.8 pg (ref 26.0–34.0)
MCHC: 31.9 g/dL (ref 30.0–36.0)
MCV: 93.5 fL (ref 78.0–100.0)
Platelets: 176 10*3/uL (ref 150–400)
RBC: 2.75 MIL/uL — ABNORMAL LOW (ref 4.22–5.81)
RDW: 16.6 % — AB (ref 11.5–15.5)
WBC: 6.8 10*3/uL (ref 4.0–10.5)

## 2015-03-10 LAB — CSF CULTURE: CULTURE: NO GROWTH

## 2015-03-10 LAB — COMPREHENSIVE METABOLIC PANEL
ALBUMIN: 2.8 g/dL — AB (ref 3.5–5.0)
ALK PHOS: 48 U/L (ref 38–126)
ALT: 18 U/L (ref 17–63)
AST: 12 U/L — AB (ref 15–41)
Anion gap: 7 (ref 5–15)
BILIRUBIN TOTAL: 0.7 mg/dL (ref 0.3–1.2)
BUN: 20 mg/dL (ref 6–20)
CO2: 29 mmol/L (ref 22–32)
Calcium: 9 mg/dL (ref 8.9–10.3)
Chloride: 108 mmol/L (ref 101–111)
Creatinine, Ser: 2.85 mg/dL — ABNORMAL HIGH (ref 0.61–1.24)
GFR calc Af Amer: 29 mL/min — ABNORMAL LOW (ref >60)
GFR calc non Af Amer: 25 mL/min — ABNORMAL LOW (ref >60)
GLUCOSE: 82 mg/dL (ref 65–99)
POTASSIUM: 3.9 mmol/L (ref 3.5–5.1)
SODIUM: 144 mmol/L (ref 135–145)
TOTAL PROTEIN: 6 g/dL — AB (ref 6.5–8.1)

## 2015-03-10 LAB — CSF CULTURE W GRAM STAIN

## 2015-03-10 LAB — VANCOMYCIN, TROUGH: VANCOMYCIN TR: 24 ug/mL — AB (ref 10.0–20.0)

## 2015-03-10 MED ORDER — VANCOMYCIN HCL IN DEXTROSE 1-5 GM/200ML-% IV SOLN
1000.0000 mg | INTRAVENOUS | Status: DC
  Administered 2015-03-10: 1000 mg via INTRAVENOUS
  Filled 2015-03-10 (×2): qty 200

## 2015-03-10 NOTE — Progress Notes (Signed)
ANTIBIOTIC CONSULT NOTE  Pharmacy Consult for vancomycin/zosyn Indication: pneumonia/sepsis  No Known Allergies  Patient Measurements: Height: 6' (182.9 cm) Weight: 256 lb (116.121 kg) IBW/kg (Calculated) : 77.6  Vital Signs: Temp: 99.2 F (37.3 C) (09/20 1100) Temp Source: Oral (09/20 1100) BP: 134/86 mmHg (09/20 0755) Pulse Rate: 83 (09/20 0755) Intake/Output from previous day: 09/19 0701 - 09/20 0700 In: 1550 [P.O.:1200; IV Piggyback:350] Out: [id number] [Urine:1750] Intake/Output from this shift: Total I/O In: 240 [P.O.:240] Out: 750 [Urine:750]  Labs:  Recent Labs  03/08/15 1320 03/09/15 0224 03/10/15 0300  WBC 11.9*  --  6.8  HGB 8.2*  --  8.2*  PLT 168  --  176  CREATININE 2.82* 2.76* 2.85*   Estimated Creatinine Clearance: 41.7 mL/min (by C-G formula based on Cr of 2.85).  Recent Labs  03/10/15 1250  VANCOTROUGH 24*     Microbiology: Recent Results (from the past 720 hour(s))  Culture, blood (routine x 2)     Status: None   Collection Time: 02/09/15 11:11 AM  Result Value Ref Range Status   Specimen Description BLOOD RIGHT FOREARM  Final   Special Requests BOTTLES DRAWN AEROBIC AND ANAEROBIC 5CC  Final   Culture   Final    NO GROWTH 5 DAYS Performed at [facility]    Report Status 02/14/2015 FINAL  Final  Culture, blood (routine x 2)     Status: None   Collection Time: 02/09/15 11:23 AM  Result Value Ref Range Status   Specimen Description BLOOD RIGHT HAND  Final   Special Requests BOTTLES DRAWN AEROBIC AND ANAEROBIC 4.5CC EACH  Final   Culture   Final    NO GROWTH 5 DAYS Performed at [facility]    Report Status 02/14/2015 FINAL  Final  Culture, expectorated sputum-assessment     Status: None   Collection Time: 02/12/15  7:36 AM  Result Value Ref Range Status   Specimen Description EXPECTORATED SPUTUM  Final   Special Requests NONE  Final   Sputum evaluation   Final    MICROSCOPIC FINDINGS SUGGEST THAT THIS SPECIMEN IS NOT  REPRESENTATIVE OF LOWER RESPIRATORY SECRETIONS. PLEASE RECOLLECT. Results Called to: Olin Pia AT [id number] 02/12/15 BY L BENFIELD    Report Status 02/12/2015 FINAL  Final  Urine culture     Status: None   Collection Time: 03/06/15  9:28 AM  Result Value Ref Range Status   Specimen Description URINE, CLEAN CATCH  Final   Special Requests NONE  Final   Culture   Final    30,000 COLONIES/mL STAPHYLOCOCCUS SPECIES (COAGULASE NEGATIVE)   Report Status 03/08/2015 FINAL  Final   Organism ID, Bacteria STAPHYLOCOCCUS SPECIES (COAGULASE NEGATIVE)  Final      Susceptibility   Staphylococcus species (coagulase negative) - MIC*    CIPROFLOXACIN >=8 RESISTANT Resistant     GENTAMICIN <=0.5 SENSITIVE Sensitive     NITROFURANTOIN <=16 SENSITIVE Sensitive     OXACILLIN >=4 RESISTANT Resistant     TETRACYCLINE <=1 SENSITIVE Sensitive     VANCOMYCIN <=0.5 SENSITIVE Sensitive     TRIMETH/SULFA <=10 SENSITIVE Sensitive     CLINDAMYCIN <=0.25 SENSITIVE Sensitive     RIFAMPIN <=0.5 SENSITIVE Sensitive     Inducible Clindamycin NEGATIVE Sensitive     * 30,000 COLONIES/mL STAPHYLOCOCCUS SPECIES (COAGULASE NEGATIVE)  Culture, blood (routine x 2)     Status: None (Preliminary result)   Collection Time: 03/06/15  7:35 PM  Result Value Ref Range Status   Specimen Description BLOOD  LEFT ANTECUBITAL  Final   Special Requests BOTTLES DRAWN AEROBIC AND ANAEROBIC 5CC   Final   Culture NO GROWTH 4 DAYS  Final   Report Status PENDING  Incomplete  Culture, blood (routine x 2)     Status: None (Preliminary result)   Collection Time: 03/06/15  7:53 PM  Result Value Ref Range Status   Specimen Description BLOOD LEFT ANTECUBITAL  Final   Special Requests BOTTLES DRAWN AEROBIC ONLY 5CC  Final   Culture NO GROWTH 4 DAYS  Final   Report Status PENDING  Incomplete  MRSA PCR Screening     Status: None   Collection Time: 03/06/15 10:33 PM  Result Value Ref Range Status   MRSA by PCR NEGATIVE NEGATIVE Final    Comment:         The GeneXpert MRSA Assay (FDA approved for NASAL specimens only), is one component of a comprehensive MRSA colonization surveillance program. It is not intended to diagnose MRSA infection nor to guide or monitor treatment for MRSA infections.   CSF culture     Status: None   Collection Time: 03/07/15  1:20 AM  Result Value Ref Range Status   Specimen Description CSF  Final   Special Requests Immunocompromised  Final   Gram Stain   Final    CYTOSPIN SMEAR WBC PRESENT, PREDOMINANTLY PMN NO ORGANISMS SEEN    Culture NO GROWTH 3 DAYS  Final   Report Status 03/10/2015 FINAL  Final  Fungus Culture with Smear     Status: None (Preliminary result)   Collection Time: 03/07/15  1:20 AM  Result Value Ref Range Status   Specimen Description CSF  Final   Special Requests NONE  Final   Fungal Smear   Final    NO YEAST OR FUNGAL ELEMENTS SEEN Performed at [facility]    Culture   Final    CULTURE IN PROGRESS FOR FOUR WEEKS Performed at [facility]    Report Status PENDING  Incomplete  Culture, expectorated sputum-assessment     Status: None   Collection Time: 03/08/15  8:10 PM  Result Value Ref Range Status   Specimen Description SPUTUM  Final   Special Requests NONE  Final   Sputum evaluation   Final    THIS SPECIMEN IS ACCEPTABLE. RESPIRATORY CULTURE REPORT TO FOLLOW.   Report Status 03/08/2015 FINAL  Final  Culture, respiratory (NON-Expectorated)     Status: None (Preliminary result)   Collection Time: 03/08/15  8:10 PM  Result Value Ref Range Status   Specimen Description SPUTUM  Final   Special Requests NONE  Final   Gram Stain   Final    MODERATE WBC PRESENT, PREDOMINANTLY PMN NO SQUAMOUS EPITHELIAL CELLS SEEN NO ORGANISMS SEEN Performed at [facility]    Culture   Final    Culture reincubated for better growth Performed at [facility]    Report Status PENDING  Incomplete    Medical History: Past Medical History   Diagnosis Date  . Paroxysmal supraventricular tachycardia 05/24/2012    Described in the treadmill report; details are pending   . Anxiety 05/24/2012  . Malignant melanoma of right great toe 11/14/2014  . HLD (hyperlipidemia)   . Chronic diastolic congestive heart failure     Assessment: 65 yom with PMH of malignant melanoma s/p Yervoy. Recent admission to [facility] for suspected pna, diagnosed with medication-induced pneumonitis secondary to [facility]. Also admitted to [facility] in between for 3 days. Pharmacy consulted to dose  vancomycin/zosyn for HCAP/sepsis (D#5). WBC wnl, afebrile. SCr 1.49 on admit, now 2.85. VT today came back slightly supratherapeutic at 24 - will decrease dose.  9/16 vanc>> 9/16 zosyn>> VT 9/20: 24 on 1250mg  IV q24h  9/16 UC>> Coag-negative staph (R-cipro, oxacillin) 9/16 MRSA PCR>> negative 9/16 blood x 2>> NGTD 9/17 CSF >> NGTD    Goal of Therapy:  Vancomycin trough level 15-20 mcg/ml  Plan:  - Decrease vancomycin to 1g IV q24h - start at 2100 tonight - Continue Zosyn 3.375g IV q8h - Monitor clinical progress, c/s, abx plan, renal function - VT@SS  as indicated   Elicia Lamp, PharmD Clinical Pharmacist Pager [phone] 03/10/2015 2:03 PM

## 2015-03-10 NOTE — Progress Notes (Signed)
Mr. Zingsheim still seems to be somewhat somnolent. He was a little more alert yesterday.  I'm not sure as to why he has the renal insufficiency area and it might be secondary to IV dye use with the CT angiogram. It might be from antibiotics.  So far, all cultures have been negative.  He says he has no appetite. He just does not want to eat much.  He's had no bleeding.  I'm sure if he is really out of bed much. He probably would benefit from physical therapy.  There's not been any count of rashes.  His labs today show his BUN to be 20 creatinine 2.85. His albumin is 2.8. His CBC shows a white cell count 6.8. Hemoglobin 8.2. Platelet count is 176.  On his physical exam, his temperature 99.8. Pulse is 93. Blood pressure 116/67. Head and neck exam shows no ocular or oral lesions. He has no palpable cervical or supraclavicular lymph nodes. Lungs are clear. There might be some slight decrease in the bases. Cardiac exam regular rate and rhythm with no murmurs, rubs or bruits. Abdomen is obese but soft. He has decent bowel sounds. He has no fluid wave. Extremities shows no clubbing, cyanosis or edema. The might be some slight nonpitting edema of the right leg. He has amputation of the right first toe.  Is still not clear as to what is going on with him. He has the renal insufficiency. He's quite anemic. His mental status still is a little bit below baseline.  I don't have any great suggestions right now. I appreciate all the great care that he is getting for everybody in the [facility].  His faith remains strong.  I will continue pray hard for him!!!  Marcina Millard 3:17

## 2015-03-10 NOTE — Care Management Note (Addendum)
Case Management Note  Patient Details  Name: Trajen Sowle MRN: [id number] Date of Birth: [birth year]  Subjective/Objective:                 Admitted with fevers, ? HCAP, hx of  melanoma on current chemo and ?pulmonary toxicity recently started on home O2.    Action/Plan: Return to home when medically stable. CM to f/u with disposition needs  Expected Discharge Date:                  Expected Discharge Plan:  [place]  In-House Referral:     Discharge planning Services  CM Consult  Post Acute Care Choice:    Choice offered to:    DME Arranged:  [facility] DME Agency:  [facility]  HH Arranged:    [place] Agency:     Status of Service:  In process, will continue to follow  Medicare Important Message Given:    Date Medicare IM Given:    Medicare IM give by:    Date Additional Medicare IM Given:    Additional Medicare Important Message give by:     If discussed at [employer] of Stay Meetings, dates discussed:    Additional Comments: Aemon Faller (Spouse)  [phone]   Whitman Hero [place], [place] [phone] 03/10/2015, 9:13 PM

## 2015-03-10 NOTE — Progress Notes (Signed)
[place] TEAM 1 - Stepdown/ICU TEAM Progress Note  James Byrd [id number] DOB: [birth year] DOA: 03/06/2015 PCP: Ann Held, MD  Admit HPI / Brief Narrative: 49 y/o WM PMHx Anxiety, PSVT, HLD, Chronic Diastolic CHF, Thrombocytopenia, Hx Rt Great Toe Melanoma ( Stage IIIB [id number]) S/P amputation on current chemo,recently discharged after being treated for pneumonia/pneumonitis on 8/29 and ?pulmonary toxicity recently started on home 2 L O2. Presented 9/16 with fevers and admitted with ?HCAP and sepsis. On 9/17 rapid response called r/t worsening respiratory failure and AMS.    HPI/Subjective: 9/20 A/O 4, states continues to have headache centered mostly in his for head with productive cough yellow sputum. States has had headaches in the past but never sustained such as now., Negative photophobia. States on 2 L O2 at home on his last hospitalization. Was supposed to have a sleep study at some point but was never scheduled. States has multiple hospitalizations in the past month.    Assessment/Plan: Acute on chronic hypoxic respiratory failure  -Patient discharged on 8/29 4 pneumonia/pneumonitis secondary to ipilimumab [facility]) -Appears essentially resolved at this time  ?HCAP +/- pneumonitis -clinical picture and cause of his infiltrates and fever unclear - treated for both PNA and inflammatory pneumonitis per PCCM - OSA  -CPAPper respiratory  Sepsis unspecified organism unclear etiology - doubt from UTI - LP done given AMS and immunocompromised but findings thus far unremarkable  AMS -Though by history the patient appears to have improved it seems to me that he is clearly not back to his baseline  -Obtain brain MRI  Tachycardia resolved   Chronic diastolic CHF -Transfuse for hemoglobin<8  Acute lactic acidosis Resolved   Acute renal failure -Most likely multifactorial to include Hypotension, Contrast and patient's chemotherapy ( ipilimumab ([place]). -  Creatinine is volatile and may be trending up .   Hx of Melanoma - completed 3 cycles chemo  -Per patient on a 6-week-hold   Acute lekocytosis -Resolved   UTI (?contaminant, asymptomatic)  -Given low colony count doubt coag negative staph identified in urine is a pathogen  Code Status: FULL Family Communication: no family present at time of exam Disposition Plan: Resolution acute mental status change    Consultants: Oncology  PCCM    Procedure/Significant Events: 8/24 echocardiogram;- Left ventricle: moderate LVH. LVEDF= 65%-to 70%.  - (grade 1 diastolicdysfunction).  9/16 CTA chest > NEG PE, persistent bilat patchy asd, worse RUL 9/17 abd u/s > No cholelithiasis or sonographic evidence of acute cholecystitis. Mild increased renal cortical echogenicity as can be seen with medical renal disease versus acute kidney injury. 9/17 LP 9/17 >  Culture 9/16 urine coag negative  9/16 blood left AC 2 NGTD 9/17 CSF negative yeast/fungal 9/18 sputum negative    Antibiotics: Zosyn 9/16 > Vancomycin 9/16 >  DVT prophylaxis: Lovenox   Devices    LINES / TUBES:      Continuous Infusions: . sodium chloride Stopped (03/08/15 2000)    Objective: VITAL SIGNS: Temp: 99.2 F (37.3 C) (09/20 1100) Temp Source: Oral (09/20 1100) BP: 135/82 mmHg (09/20 1545) Pulse Rate: 88 (09/20 1545) SPO2; FIO2:   Intake/Output Summary (Last 24 hours) at 03/10/15 2238 Last data filed at 03/10/15 0800  Gross per 24 hour  Intake    410 ml  Output   1650 ml  Net  -1240 ml     Exam: General:A/O 4, states continues to have headache , No acute respiratory distress Eyes: Positive headache, negative photophobia, negative double vision,negative scleral hemorrhage ENT: Negative  Runny nose, negative ear pain, negative gingival bleeding, Neck:  Negative scars, masses, torticollis, lymphadenopathy, JVD Lungs: Clear to auscultation bilaterally without wheezes or  crackles Cardiovascular: Regular rate and rhythm without murmur gallop or rub normal S1 and S2 Abdomen:negative abdominal pain, nondistended, positive soft, bowel sounds, no rebound, no ascites, no appreciable mass Extremities: No significant cyanosis, clubbing, or edema bilateral lower extremities Psychiatric:  Negative depression, negative anxiety, negative fatigue, negative mania  Neurologic:  Cranial nerves II through XII intact, tongue/uvula midline, all extremities muscle strength 5/5, sensation intact throughout, negative dysarthria, negative expressive aphasia, negative receptive aphasia.   Data Reviewed: Basic Metabolic Panel:  Recent Labs Lab 03/06/15 1935 03/07/15 0142 03/08/15 1320 03/09/15 0224 03/10/15 0300  NA 142 141 143 143 144  K 3.5 3.9 3.4* 3.3* 3.9  CL 104 105 108 110 108  CO2 29 25 26 24 29   GLUCOSE 99 129* 110* 81 82  BUN 14 18 32* 27* 20  CREATININE 2.24* 2.88* 2.82* 2.76* 2.85*  CALCIUM 8.1* 8.1* 8.1* 8.3* 9.0  MG 1.3*  --   --   --   --   PHOS 2.9  --   --   --   --    Liver Function Tests:  Recent Labs Lab 03/06/15 0820 03/06/15 1935 03/10/15 0300  AST 12* 25 12*  ALT 19 21 18   ALKPHOS 48 45 48  BILITOT 0.8 1.3* 0.7  PROT 6.1* 5.4* 6.0*  ALBUMIN 3.2* 2.8* 2.8*    Recent Labs Lab 03/06/15 0820 03/06/15 1935  LIPASE 22 25  AMYLASE  --  43   No results for input(s): AMMONIA in the last 168 hours. CBC:  Recent Labs Lab 03/06/15 0820 03/06/15 1935 03/07/15 0142 03/08/15 1320 03/10/15 0300  WBC 10.4 7.6 17.9* 11.9* 6.8  NEUTROABS 8.5* 7.2  --   --   --   HGB 9.4* 9.7* 9.4* 8.2* 8.2*  HCT 28.7* 29.8* 28.9* 25.2* 25.7*  MCV 93.5 95.8 95.1 93.3 93.5  PLT 133* 108* 149* 168 176   Cardiac Enzymes:  Recent Labs Lab 03/06/15 1935 03/07/15 0142 03/07/15 1044  TROPONINI 0.05* 0.04* <0.03   BNP (last 3 results)  Recent Labs  02/09/15 1118 02/11/15 0830  BNP 17.0 9.3    ProBNP (last 3 results) No results for input(s):  PROBNP in the last 8760 hours.  CBG:  Recent Labs Lab 03/06/15 2025  GLUCAP 90    Recent Results (from the past 240 hour(s))  Urine culture     Status: None   Collection Time: 03/06/15  9:28 AM  Result Value Ref Range Status   Specimen Description URINE, CLEAN CATCH  Final   Special Requests NONE  Final   Culture   Final    30,000 COLONIES/mL STAPHYLOCOCCUS SPECIES (COAGULASE NEGATIVE)   Report Status 03/08/2015 FINAL  Final   Organism ID, Bacteria STAPHYLOCOCCUS SPECIES (COAGULASE NEGATIVE)  Final      Susceptibility   Staphylococcus species (coagulase negative) - MIC*    CIPROFLOXACIN >=8 RESISTANT Resistant     GENTAMICIN <=0.5 SENSITIVE Sensitive     NITROFURANTOIN <=16 SENSITIVE Sensitive     OXACILLIN >=4 RESISTANT Resistant     TETRACYCLINE <=1 SENSITIVE Sensitive     VANCOMYCIN <=0.5 SENSITIVE Sensitive     TRIMETH/SULFA <=10 SENSITIVE Sensitive     CLINDAMYCIN <=0.25 SENSITIVE Sensitive     RIFAMPIN <=0.5 SENSITIVE Sensitive     Inducible Clindamycin NEGATIVE Sensitive     * 30,000 COLONIES/mL STAPHYLOCOCCUS SPECIES (  COAGULASE NEGATIVE)  Culture, blood (routine x 2)     Status: None (Preliminary result)   Collection Time: 03/06/15  7:35 PM  Result Value Ref Range Status   Specimen Description BLOOD LEFT ANTECUBITAL  Final   Special Requests BOTTLES DRAWN AEROBIC AND ANAEROBIC 5CC   Final   Culture NO GROWTH 4 DAYS  Final   Report Status PENDING  Incomplete  Culture, blood (routine x 2)     Status: None (Preliminary result)   Collection Time: 03/06/15  7:53 PM  Result Value Ref Range Status   Specimen Description BLOOD LEFT ANTECUBITAL  Final   Special Requests BOTTLES DRAWN AEROBIC ONLY 5CC  Final   Culture NO GROWTH 4 DAYS  Final   Report Status PENDING  Incomplete  MRSA PCR Screening     Status: None   Collection Time: 03/06/15 10:33 PM  Result Value Ref Range Status   MRSA by PCR NEGATIVE NEGATIVE Final    Comment:        The GeneXpert MRSA Assay  (FDA approved for NASAL specimens only), is one component of a comprehensive MRSA colonization surveillance program. It is not intended to diagnose MRSA infection nor to guide or monitor treatment for MRSA infections.   CSF culture     Status: None   Collection Time: 03/07/15  1:20 AM  Result Value Ref Range Status   Specimen Description CSF  Final   Special Requests Immunocompromised  Final   Gram Stain   Final    CYTOSPIN SMEAR WBC PRESENT, PREDOMINANTLY PMN NO ORGANISMS SEEN    Culture NO GROWTH 3 DAYS  Final   Report Status 03/10/2015 FINAL  Final  Fungus Culture with Smear     Status: None (Preliminary result)   Collection Time: 03/07/15  1:20 AM  Result Value Ref Range Status   Specimen Description CSF  Final   Special Requests NONE  Final   Fungal Smear   Final    NO YEAST OR FUNGAL ELEMENTS SEEN Performed at [facility]    Culture   Final    CULTURE IN PROGRESS FOR FOUR WEEKS Performed at [facility]    Report Status PENDING  Incomplete  Culture, expectorated sputum-assessment     Status: None   Collection Time: 03/08/15  8:10 PM  Result Value Ref Range Status   Specimen Description SPUTUM  Final   Special Requests NONE  Final   Sputum evaluation   Final    THIS SPECIMEN IS ACCEPTABLE. RESPIRATORY CULTURE REPORT TO FOLLOW.   Report Status 03/08/2015 FINAL  Final  Culture, respiratory (NON-Expectorated)     Status: None (Preliminary result)   Collection Time: 03/08/15  8:10 PM  Result Value Ref Range Status   Specimen Description SPUTUM  Final   Special Requests NONE  Final   Gram Stain   Final    MODERATE WBC PRESENT, PREDOMINANTLY PMN NO SQUAMOUS EPITHELIAL CELLS SEEN NO ORGANISMS SEEN Performed at [facility]    Culture   Final    Culture reincubated for better growth Performed at [facility]    Report Status PENDING  Incomplete     Studies:  Recent x-ray studies have been reviewed in detail by the Attending  Physician  Scheduled Meds:  Scheduled Meds: . antiseptic oral rinse  7 mL Mouth Rinse BID  . aspirin EC  81 mg Oral Daily  . diltiazem  120 mg Oral Daily  . enoxaparin (LOVENOX) injection  40 mg Subcutaneous  Q24H  . escitalopram  10 mg Oral Daily  . piperacillin-tazobactam (ZOSYN)  IV  3.375 g Intravenous 3 times per day  . vancomycin  1,000 mg Intravenous Q24H    Time spent on care of this patient: 40 mins   WOODS, Geraldo Docker , MD  [facility] Office  [phone] Pager [phone]  On-Call/Text Page:      [email or web address]      password [email or web address]  If 7PM-7AM, please contact night-coverage [email or web address] Password [email or web address] 03/10/2015, 10:38 PM   LOS: 4 days   Care during the described time interval was provided by me .  I have reviewed this patient's available data, including medical history, events of note, physical examination, and all test results as part of my evaluation. I have personally reviewed and interpreted all radiology studies.   Dia Crawford, MD [phone] Pager

## 2015-03-11 ENCOUNTER — Encounter (HOSPITAL_COMMUNITY): Payer: Self-pay | Admitting: *Deleted

## 2015-03-11 LAB — CULTURE, BLOOD (ROUTINE X 2)
Culture: NO GROWTH
Culture: NO GROWTH

## 2015-03-11 LAB — COMPREHENSIVE METABOLIC PANEL
ALBUMIN: 2.6 g/dL — AB (ref 3.5–5.0)
ALT: 18 U/L (ref 17–63)
ANION GAP: 8 (ref 5–15)
AST: 11 U/L — ABNORMAL LOW (ref 15–41)
Alkaline Phosphatase: 48 U/L (ref 38–126)
BILIRUBIN TOTAL: 0.9 mg/dL (ref 0.3–1.2)
BUN: 16 mg/dL (ref 6–20)
CO2: 28 mmol/L (ref 22–32)
Calcium: 8.5 mg/dL — ABNORMAL LOW (ref 8.9–10.3)
Chloride: 101 mmol/L (ref 101–111)
Creatinine, Ser: 2.77 mg/dL — ABNORMAL HIGH (ref 0.61–1.24)
GFR calc Af Amer: 30 mL/min — ABNORMAL LOW (ref >60)
GFR calc non Af Amer: 25 mL/min — ABNORMAL LOW (ref >60)
GLUCOSE: 89 mg/dL (ref 65–99)
POTASSIUM: 3.4 mmol/L — AB (ref 3.5–5.1)
Sodium: 137 mmol/L (ref 135–145)
TOTAL PROTEIN: 5.5 g/dL — AB (ref 6.5–8.1)

## 2015-03-11 LAB — CBC
HEMATOCRIT: 24.9 % — AB (ref 39.0–52.0)
HEMOGLOBIN: 8.3 g/dL — AB (ref 13.0–17.0)
MCH: 30.6 pg (ref 26.0–34.0)
MCHC: 33.3 g/dL (ref 30.0–36.0)
MCV: 91.9 fL (ref 78.0–100.0)
Platelets: 183 10*3/uL (ref 150–400)
RBC: 2.71 MIL/uL — AB (ref 4.22–5.81)
RDW: 16.3 % — ABNORMAL HIGH (ref 11.5–15.5)
WBC: 7.3 10*3/uL (ref 4.0–10.5)

## 2015-03-11 LAB — BLOOD GAS, ARTERIAL
ACID-BASE EXCESS: 2.1 mmol/L — AB (ref 0.0–2.0)
BICARBONATE: 25.9 meq/L — AB (ref 20.0–24.0)
O2 CONTENT: 3 L/min
O2 SAT: 95.7 %
PATIENT TEMPERATURE: 98.6
TCO2: 27.1 mmol/L (ref 0–100)
pCO2 arterial: 39 mmHg (ref 35.0–45.0)
pH, Arterial: 7.438 (ref 7.350–7.450)
pO2, Arterial: 78.8 mmHg — ABNORMAL LOW (ref 80.0–100.0)

## 2015-03-11 LAB — CULTURE, RESPIRATORY W GRAM STAIN: Culture: NORMAL

## 2015-03-11 LAB — LACTIC ACID, PLASMA: LACTIC ACID, VENOUS: 0.5 mmol/L (ref 0.5–2.0)

## 2015-03-11 LAB — CULTURE, RESPIRATORY

## 2015-03-11 LAB — MAGNESIUM: Magnesium: 1.6 mg/dL — ABNORMAL LOW (ref 1.7–2.4)

## 2015-03-11 MED ORDER — ONDANSETRON HCL 4 MG/2ML IJ SOLN
4.0000 mg | INTRAMUSCULAR | Status: DC | PRN
  Administered 2015-03-11 – 2015-03-15 (×9): 4 mg via INTRAVENOUS
  Filled 2015-03-11 (×10): qty 2

## 2015-03-11 MED ORDER — LEVOFLOXACIN IN D5W 500 MG/100ML IV SOLN
500.0000 mg | INTRAVENOUS | Status: DC
  Administered 2015-03-11: 500 mg via INTRAVENOUS
  Filled 2015-03-11 (×2): qty 100

## 2015-03-11 NOTE — Progress Notes (Signed)
[place] TEAM 1 - Stepdown/ICU TEAM PROGRESS NOTE  James Byrd [id number] DOB: [birth year] DOA: 03/06/2015 PCP: Ann Held, MD  Admit HPI / Brief Narrative: 49 y/o male with hx melanoma on current chemo and ?pulmonary toxicity recently started on home O2. Presented 9/16 with fevers and admitted with ?HCAP and sepsis. On 9/17 rapid response called r/t worsening respiratory failure and AMS.   Significant Events: 9/16 CTA chest > NEG PE, persistent bilat patchy asd, worse RUL 9/17 tx ICU  9/17 abd u/s > No cholelithiasis or sonographic evidence of acute cholecystitis. Mild increased renal cortical echogenicity as can be seen with medical renal disease versus acute kidney injury. 9/17 LP 9/17   HPI/Subjective: The patient still seems subtly confused but is improved compared to the last time I saw him 48 hours ago.  His primary complaint today has been that of nausea which is unrelenting and is interfering with his ability to eat.  He has moved his bowels daily for the past multiple days per his report.  He denies abdominal pain.  He does report generalized headache but no photophobia, neck stiffness, back pain, or jaw pain.  Assessment/Plan:  Acute hypoxic respiratory failure  Appears essentially resolved at this time  Sepsis - ?due to severe acute sinusitis  unclear etiology - doubt from UTI - LP done given AMS and immunocompromised but findings thus far unremarkable - MRI brain 9/20 suggested extensive paranasal sinus disease - continue antibiotics for this etiology and follow  ?HCAP +/- pneumonitis clinical picture and cause of his infiltrates and fever unclear - treated for both PNA and inflammatory pneumonitis per PCCM  AMS Though by history the patient appears to have improved it seems to me that he is clearly not back to his baseline - MRI brain 9/20 w/o acute brain abnormality noted, but suggested extensive paranasal sinus disease - long-term friend at bedside today  states he is definitely closer to baseline - check 0000000 and folic acid levels  Refractory nausea The patient denies abdominal pain - he is moving his bowels and his abdominal exam is unrevealing - perhaps this is related to his antibiotics - change antibiotic while still adequately covering acute sinusitis and follow  Tachycardia resolved   Acute lactic acidosis Resolved   Acute renal failure  r/t hypotension and contrast +/- chemo - Creatinine is very slowly improving  Hx of Melanoma - completed 3 cycles chemo  Oncology following   Acute lekocytosis Appears to be slowly normalizing  Hx OSA   UTI (?contaminant, asymptomatic)  Given low colony count doubt coag negative staph identified in urine is a pathogen   Code Status: FULL Family Communication: Close family friend at bedside - spoke with wife via telephone with permission from patient Disposition Plan: SDU - probable transfer to medical bed in a.m. if mental status continues to improve  Consultants: Oncology  PCCM   Antibiotics: Zosyn 9/16 > 9/21 Vancomycin 9/16 > 9/21 Levaquin 9/21 >  DVT prophylaxis: Lovenox  Objective: Blood pressure 117/78, pulse 97, temperature 98.1 F (36.7 C), temperature source Oral, resp. rate 19, height 6' (1.829 m), weight 116.121 kg (256 lb), SpO2 97 %.  Intake/Output Summary (Last 24 hours) at 03/11/15 1506 Last data filed at 03/11/15 0845  Gross per 24 hour  Intake    470 ml  Output   1650 ml  Net  -1180 ml   Exam: General: No acute respiratory distress - alert and more conversant - able to tell me where he is  and why much more quickly than 9/19 Lungs: Clear to auscultation bilaterally without wheezes or crackles Cardiovascular: Regular rate and rhythm without murmur gallop or rub Abdomen: Nontender, overweight, soft, bowel sounds positive, no rebound, no ascites, no appreciable mass Extremities: No significant cyanosis, clubbing, edema bilateral lower extremities  Data  Reviewed: Basic Metabolic Panel:  Recent Labs Lab 03/06/15 1935 03/07/15 0142 03/08/15 1320 03/09/15 0224 03/10/15 0300 03/11/15 0214  NA 142 141 143 143 144 137  K 3.5 3.9 3.4* 3.3* 3.9 3.4*  CL 104 105 108 110 108 101  CO2 29 25 26 24 29 28   GLUCOSE 99 129* 110* 81 82 89  BUN 14 18 32* 27* 20 16  CREATININE 2.24* 2.88* 2.82* 2.76* 2.85* 2.77*  CALCIUM 8.1* 8.1* 8.1* 8.3* 9.0 8.5*  MG 1.3*  --   --   --   --  1.6*  PHOS 2.9  --   --   --   --   --     CBC:  Recent Labs Lab 03/06/15 0820 03/06/15 1935 03/07/15 0142 03/08/15 1320 03/10/15 0300 03/11/15 0214  WBC 10.4 7.6 17.9* 11.9* 6.8 7.3  NEUTROABS 8.5* 7.2  --   --   --   --   HGB 9.4* 9.7* 9.4* 8.2* 8.2* 8.3*  HCT 28.7* 29.8* 28.9* 25.2* 25.7* 24.9*  MCV 93.5 95.8 95.1 93.3 93.5 91.9  PLT 133* 108* 149* 168 176 183    Liver Function Tests:  Recent Labs Lab 03/06/15 0820 03/06/15 1935 03/10/15 0300 03/11/15 0214  AST 12* 25 12* 11*  ALT 19 21 18 18   ALKPHOS 48 45 48 48  BILITOT 0.8 1.3* 0.7 0.9  PROT 6.1* 5.4* 6.0* 5.5*  ALBUMIN 3.2* 2.8* 2.8* 2.6*    Recent Labs Lab 03/06/15 0820 03/06/15 1935  LIPASE 22 25  AMYLASE  --  43   Coags:  Recent Labs Lab 03/06/15 1935  INR 1.25    Recent Labs Lab 03/06/15 1935  APTT 30    Cardiac Enzymes:  Recent Labs Lab 03/06/15 1935 03/07/15 0142 03/07/15 1044  TROPONINI 0.05* 0.04* <0.03    CBG:  Recent Labs Lab 03/06/15 2025  GLUCAP 90    Recent Results (from the past 240 hour(s))  Urine culture     Status: None   Collection Time: 03/06/15  9:28 AM  Result Value Ref Range Status   Specimen Description URINE, CLEAN CATCH  Final   Special Requests NONE  Final   Culture   Final    30,000 COLONIES/mL STAPHYLOCOCCUS SPECIES (COAGULASE NEGATIVE)   Report Status 03/08/2015 FINAL  Final   Organism ID, Bacteria STAPHYLOCOCCUS SPECIES (COAGULASE NEGATIVE)  Final      Susceptibility   Staphylococcus species (coagulase negative) - MIC*     CIPROFLOXACIN >=8 RESISTANT Resistant     GENTAMICIN <=0.5 SENSITIVE Sensitive     NITROFURANTOIN <=16 SENSITIVE Sensitive     OXACILLIN >=4 RESISTANT Resistant     TETRACYCLINE <=1 SENSITIVE Sensitive     VANCOMYCIN <=0.5 SENSITIVE Sensitive     TRIMETH/SULFA <=10 SENSITIVE Sensitive     CLINDAMYCIN <=0.25 SENSITIVE Sensitive     RIFAMPIN <=0.5 SENSITIVE Sensitive     Inducible Clindamycin NEGATIVE Sensitive     * 30,000 COLONIES/mL STAPHYLOCOCCUS SPECIES (COAGULASE NEGATIVE)  Culture, blood (routine x 2)     Status: None   Collection Time: 03/06/15  7:35 PM  Result Value Ref Range Status   Specimen Description BLOOD LEFT ANTECUBITAL  Final  Special Requests BOTTLES DRAWN AEROBIC AND ANAEROBIC 5CC   Final   Culture NO GROWTH 5 DAYS  Final   Report Status 03/11/2015 FINAL  Final  Culture, blood (routine x 2)     Status: None   Collection Time: 03/06/15  7:53 PM  Result Value Ref Range Status   Specimen Description BLOOD LEFT ANTECUBITAL  Final   Special Requests BOTTLES DRAWN AEROBIC ONLY 5CC  Final   Culture NO GROWTH 5 DAYS  Final   Report Status 03/11/2015 FINAL  Final  MRSA PCR Screening     Status: None   Collection Time: 03/06/15 10:33 PM  Result Value Ref Range Status   MRSA by PCR NEGATIVE NEGATIVE Final    Comment:        The GeneXpert MRSA Assay (FDA approved for NASAL specimens only), is one component of a comprehensive MRSA colonization surveillance program. It is not intended to diagnose MRSA infection nor to guide or monitor treatment for MRSA infections.   CSF culture     Status: None   Collection Time: 03/07/15  1:20 AM  Result Value Ref Range Status   Specimen Description CSF  Final   Special Requests Immunocompromised  Final   Gram Stain   Final    CYTOSPIN SMEAR WBC PRESENT, PREDOMINANTLY PMN NO ORGANISMS SEEN    Culture NO GROWTH 3 DAYS  Final   Report Status 03/10/2015 FINAL  Final  Fungus Culture with Smear     Status: None (Preliminary  result)   Collection Time: 03/07/15  1:20 AM  Result Value Ref Range Status   Specimen Description CSF  Final   Special Requests NONE  Final   Fungal Smear   Final    NO YEAST OR FUNGAL ELEMENTS SEEN Performed at [facility]    Culture   Final    CULTURE IN PROGRESS FOR FOUR WEEKS Performed at [facility]    Report Status PENDING  Incomplete  Culture, expectorated sputum-assessment     Status: None   Collection Time: 03/08/15  8:10 PM  Result Value Ref Range Status   Specimen Description SPUTUM  Final   Special Requests NONE  Final   Sputum evaluation   Final    THIS SPECIMEN IS ACCEPTABLE. RESPIRATORY CULTURE REPORT TO FOLLOW.   Report Status 03/08/2015 FINAL  Final  Culture, respiratory (NON-Expectorated)     Status: None   Collection Time: 03/08/15  8:10 PM  Result Value Ref Range Status   Specimen Description SPUTUM  Final   Special Requests NONE  Final   Gram Stain   Final    MODERATE WBC PRESENT, PREDOMINANTLY PMN NO SQUAMOUS EPITHELIAL CELLS SEEN NO ORGANISMS SEEN Performed at [facility]    Culture   Final    NORMAL OROPHARYNGEAL FLORA Performed at [facility]    Report Status 03/11/2015 FINAL  Final     Studies:   Recent x-ray studies have been reviewed in detail by the Attending Physician  Scheduled Meds:  Scheduled Meds: . antiseptic oral rinse  7 mL Mouth Rinse BID  . aspirin EC  81 mg Oral Daily  . diltiazem  120 mg Oral Daily  . enoxaparin (LOVENOX) injection  40 mg Subcutaneous Q24H  . escitalopram  10 mg Oral Daily  . piperacillin-tazobactam (ZOSYN)  IV  3.375 g Intravenous 3 times per day  . vancomycin  1,000 mg Intravenous Q24H    Time spent on care of this patient: 35 mins  Cherene Altes , MD   [facility] Office  [phone] Pager - Text Page per Amion as per below:  On-Call/Text Page:      [email or web address]      password [email or web address]  If 7PM-7AM, please contact  night-coverage [email or web address] Password [email or web address] 03/11/2015, 3:06 PM   LOS: 5 days

## 2015-03-11 NOTE — Progress Notes (Signed)
   03/10/15 2350  BiPAP/CPAP/SIPAP  BiPAP/CPAP/SIPAP Pt Type Adult  Mask Type Nasal mask  Mask Size Medium  Set Rate 0 breaths/min  Respiratory Rate 18 breaths/min  IPAP 10 cmH20  EPAP 10 cmH2O  Flow Rate 3 lpm  BiPAP/CPAP/SIPAP CPAP  Patient Home Equipment No  Auto Titrate No  Patient placed on CPAP 10 with 3L O2 bleed in. He tolerates it very well at this time.

## 2015-03-11 NOTE — Progress Notes (Addendum)
UR COMPLETED  

## 2015-03-12 ENCOUNTER — Inpatient Hospital Stay: Payer: BLUE CROSS/BLUE SHIELD | Admitting: Adult Health

## 2015-03-12 ENCOUNTER — Inpatient Hospital Stay (HOSPITAL_COMMUNITY): Payer: BLUE CROSS/BLUE SHIELD

## 2015-03-12 DIAGNOSIS — J349 Unspecified disorder of nose and nasal sinuses: Secondary | ICD-10-CM | POA: Diagnosis present

## 2015-03-12 DIAGNOSIS — R1013 Epigastric pain: Secondary | ICD-10-CM

## 2015-03-12 DIAGNOSIS — R109 Unspecified abdominal pain: Secondary | ICD-10-CM | POA: Diagnosis present

## 2015-03-12 DIAGNOSIS — E785 Hyperlipidemia, unspecified: Secondary | ICD-10-CM

## 2015-03-12 DIAGNOSIS — R410 Disorientation, unspecified: Secondary | ICD-10-CM | POA: Diagnosis present

## 2015-03-12 DIAGNOSIS — H7090 Unspecified mastoiditis, unspecified ear: Secondary | ICD-10-CM

## 2015-03-12 LAB — BASIC METABOLIC PANEL
ANION GAP: 11 (ref 5–15)
Anion gap: 10 (ref 5–15)
BUN: 15 mg/dL (ref 6–20)
BUN: 14 mg/dL (ref 6–20)
CHLORIDE: 100 mmol/L — AB (ref 101–111)
CO2: 29 mmol/L (ref 22–32)
CO2: 27 mmol/L (ref 22–32)
CREATININE: 3.2 mg/dL — AB (ref 0.61–1.24)
Calcium: 8.5 mg/dL — ABNORMAL LOW (ref 8.9–10.3)
Calcium: 8.4 mg/dL — ABNORMAL LOW (ref 8.9–10.3)
Chloride: 100 mmol/L — ABNORMAL LOW (ref 101–111)
Creatinine, Ser: 3.26 mg/dL — ABNORMAL HIGH (ref 0.61–1.24)
GFR calc Af Amer: 25 mL/min — ABNORMAL LOW (ref >60)
GFR calc non Af Amer: 21 mL/min — ABNORMAL LOW (ref >60)
GFR, EST AFRICAN AMERICAN: 24 mL/min — AB (ref >60)
GFR, EST NON AFRICAN AMERICAN: 21 mL/min — AB (ref >60)
GLUCOSE: 77 mg/dL (ref 65–99)
Glucose, Bld: 99 mg/dL (ref 65–99)
POTASSIUM: 3.4 mmol/L — AB (ref 3.5–5.1)
Potassium: 3.1 mmol/L — ABNORMAL LOW (ref 3.5–5.1)
Sodium: 139 mmol/L (ref 135–145)
Sodium: 138 mmol/L (ref 135–145)

## 2015-03-12 LAB — VITAMIN B12: Vitamin B-12: 917 pg/mL — ABNORMAL HIGH (ref 180–914)

## 2015-03-12 LAB — CBC
HEMATOCRIT: 25.1 % — AB (ref 39.0–52.0)
Hemoglobin: 8.2 g/dL — ABNORMAL LOW (ref 13.0–17.0)
MCH: 30 pg (ref 26.0–34.0)
MCHC: 32.7 g/dL (ref 30.0–36.0)
MCV: 91.9 fL (ref 78.0–100.0)
Platelets: 205 10*3/uL (ref 150–400)
RBC: 2.73 MIL/uL — ABNORMAL LOW (ref 4.22–5.81)
RDW: 16.3 % — AB (ref 11.5–15.5)
WBC: 7.3 10*3/uL (ref 4.0–10.5)

## 2015-03-12 LAB — FOLATE: FOLATE: 22.6 ng/mL (ref >5.9)

## 2015-03-12 LAB — MAGNESIUM: Magnesium: 1.6 mg/dL — ABNORMAL LOW (ref 1.7–2.4)

## 2015-03-12 LAB — AMMONIA: Ammonia: 44 umol/L — ABNORMAL HIGH (ref 9–35)

## 2015-03-12 LAB — RPR: RPR Ser Ql: NONREACTIVE

## 2015-03-12 MED ORDER — LEVOFLOXACIN IN D5W 750 MG/150ML IV SOLN: 750.0000 mg | INTRAVENOUS | Status: DC

## 2015-03-12 MED ORDER — LEVOFLOXACIN IN D5W 750 MG/150ML IV SOLN
750.0000 mg | INTRAVENOUS | Status: DC
  Administered 2015-03-12: 750 mg via INTRAVENOUS
  Filled 2015-03-12 (×2): qty 150

## 2015-03-12 MED ORDER — CHLORPROMAZINE HCL 25 MG/ML IJ SOLN
25.0000 mg | Freq: Once | INTRAMUSCULAR | Status: AC
  Administered 2015-03-12: 25 mg via INTRAVENOUS
  Filled 2015-03-12: qty 1

## 2015-03-12 MED ORDER — SCOPOLAMINE 1 MG/3DAYS TD PT72
1.0000 | MEDICATED_PATCH | TRANSDERMAL | Status: DC
  Administered 2015-03-12 – 2015-03-15 (×2): 1.5 mg via TRANSDERMAL
  Filled 2015-03-12 (×2): qty 1

## 2015-03-12 NOTE — Progress Notes (Signed)
Throughout night pt's O2 demand waxed and waned between 3 and 6L. O2 demand was least (3L) and SpO2 the highest (96-97%) post Phenergan administration. Pt dry-heaved several times but only produced clear liquid emesis once.

## 2015-03-12 NOTE — Progress Notes (Signed)
[place] TEAM 1 - Stepdown/ICU TEAM Progress Note  James Byrd [id number] DOB: January 27, 1966 DOA: 03/06/2015 PCP: Ann Held, MD  Admit HPI / Brief Narrative: 49 y/o WM PMHx Anxiety, PSVT, HLD, Chronic Diastolic CHF, Thrombocytopenia, Hx Rt Great Toe Melanoma ( Stage IIIB [id number]) S/P amputation on current chemo,recently discharged after being treated for pneumonia/pneumonitis on 8/29 and ?pulmonary toxicity recently started on home 2 L O2. Presented 9/16 with fevers and admitted with ?HCAP and sepsis. On 9/17 rapid response called r/t worsening respiratory failure and AMS.    HPI/Subjective: 9/22 A/O 4, states previous concern is his nausea. Mostly postprandial. States has had 3 sessions of chemotherapy and the fourth session has been canceled.  States on 2 L O2 at home on his last hospitalization. Was supposed to have a sleep study at some point but was never scheduled. States has multiple hospitalizations in the past month.    Assessment/Plan: Acute on chronic hypoxic respiratory failure  -Patient discharged on 8/29 4 pneumonia/pneumonitis secondary to ipilimumab Curt Bears) -Resolved at this time  ?HCAP +/- pneumonitis -clinical picture and cause of his infiltrates and fever unclear  - treated for both PNA and inflammatory pneumonitis per PCCM. 9/21 last day of antibiotics for PNA - OSA  -CPAPper respiratory  Sepsis unspecified organism/Bilateral Mastoid effusion/Paranasal Sinus disease - LP done given AMS and immunocompromised;unremarkable findings - MRI brain 9/20 suggested extensive paranasal sinus disease - continue antibiotics for this etiology; will switch to PO as soon as patient can tolerate  AMS -Resolved -Brain MRI; sinus disease see results below  Tachycardia -Most likely secondary to his N/V, and dehydration  Chronic diastolic CHF -Transfuse for hemoglobin<8 -Monitor for fluid overload  Acute lactic acidosis -Resolved   Acute renal  failure -Most likely multifactorial to include Hypotension, Contrast and patient's chemotherapy ( ipilimumab ([facility]). - Creatinine is volatile and is trending up.  Patient has had intractable N/V may be secondary to dehydration .  -Normal saline 137ml/hr; if not improved by the A.m. will consult nephrology  Hx of Melanoma - completed 3 cycles chemo  -Per patient on a 6-week-hold   Acute lekocytosis -Resolved   Refractory nausea -Place patient on BRAT-G -Thorazine 25 mg IV 1 -Continue Zofran and Phenergan PRN;  -scopolamine patch  -KUB pending; ileus?   Code Status: FULL Family Communication: no family present at time of exam Disposition Plan: Resolution acute mental status change    Consultants: Oncology  PCCM    Procedure/Significant Events: 8/24 echocardiogram;- Left ventricle: moderate LVH. LVEDF= 65%-to 70%.  - (grade 1 diastolicdysfunction).  9/16 CTA chest > NEG PE, persistent bilat patchy asd, worse RUL 9/17 abd u/s > No cholelithiasis or sonographic evidence of acute cholecystitis. Mild increased renal cortical echogenicity as can be seen with medical renal disease versus acute kidney injury. 9/17 LP 9/17 > 9/20 MRI brain;No acute intracranial abnormality. - Extensive paranasal sinus mucosal disease, - Left >>Rt mastoid effusions   Culture 9/16 urine coag negative  9/16 blood left AC 2 NGTD 9/17 CSF negative yeast/fungal 9/18 sputum negative    Antibiotics: Zosyn 9/16 > stopped 9/21 Vancomycin 9/16 > stopped 9/21 Levofloxacin 9/21>>  DVT prophylaxis: Lovenox   Devices    LINES / TUBES:      Continuous Infusions: . sodium chloride 125 mL/hr at 03/12/15 1750    Objective: VITAL SIGNS: Temp: 98.3 F (36.8 C) (09/22 1551) Temp Source: Oral (09/22 1551) BP: 112/76 mmHg (09/22 1543) Pulse Rate: 101 (09/22 1543) SPO2; FIO2:   Intake/Output Summary (Last  24 hours) at 03/12/15 1949 Last data filed at 03/12/15 1750  Gross per 24  hour  Intake  817.5 ml  Output    525 ml  Net  292.5 ml     Exam: General:A/O 4, states continues to have headache , No acute respiratory distress Eyes: Positive headache, negative photophobia, negative double vision,negative scleral hemorrhage ENT: Negative Runny nose, negative ear pain, negative gingival bleeding, Neck:  Negative scars, masses, torticollis, lymphadenopathy, JVD Lungs: Clear to auscultation bilaterally without wheezes or crackles Cardiovascular: Regular rate and rhythm without murmur gallop or rub normal S1 and S2 Abdomen:negative abdominal pain, mildly distended, positive soft, bowel sounds, no rebound, no ascites, no appreciable mass Extremities: No significant cyanosis, clubbing, or edema bilateral lower extremities Psychiatric:  Negative depression, negative anxiety, negative fatigue, negative mania  Neurologic:  Cranial nerves II through XII intact, tongue/uvula midline, all extremities muscle strength 5/5, sensation intact throughout, negative dysarthria, negative expressive aphasia, negative receptive aphasia.   Data Reviewed: Basic Metabolic Panel:  Recent Labs Lab 03/06/15 1935  03/09/15 0224 03/10/15 0300 03/11/15 0214 03/12/15 0314 03/12/15 1625  NA 142  < > 143 144 137 139 138  K 3.5  < > 3.3* 3.9 3.4* 3.4* 3.1*  CL 104  < > 110 108 101 100* 100*  CO2 29  < > 24 29 28 29 27   GLUCOSE 99  < > 81 82 89 77 99  BUN 14  < > 27* 20 16 15 14   CREATININE 2.24*  < > 2.76* 2.85* 2.77* 3.20* 3.26*  CALCIUM 8.1*  < > 8.3* 9.0 8.5* 8.5* 8.4*  MG 1.3*  --   --   --  1.6*  --  1.6*  PHOS 2.9  --   --   --   --   --   --   < > = values in this interval not displayed. Liver Function Tests:  Recent Labs Lab 03/06/15 0820 03/06/15 1935 03/10/15 0300 03/11/15 0214  AST 12* 25 12* 11*  ALT 19 21 18 18   ALKPHOS 48 45 48 48  BILITOT 0.8 1.3* 0.7 0.9  PROT 6.1* 5.4* 6.0* 5.5*  ALBUMIN 3.2* 2.8* 2.8* 2.6*    Recent Labs Lab 03/06/15 0820 03/06/15 1935   LIPASE 22 25  AMYLASE  --  43    Recent Labs Lab 03/12/15 0314  AMMONIA 44*   CBC:  Recent Labs Lab 03/06/15 0820 03/06/15 1935 03/07/15 0142 03/08/15 1320 03/10/15 0300 03/11/15 0214 03/12/15 0314  WBC 10.4 7.6 17.9* 11.9* 6.8 7.3 7.3  NEUTROABS 8.5* 7.2  --   --   --   --   --   HGB 9.4* 9.7* 9.4* 8.2* 8.2* 8.3* 8.2*  HCT 28.7* 29.8* 28.9* 25.2* 25.7* 24.9* 25.1*  MCV 93.5 95.8 95.1 93.3 93.5 91.9 91.9  PLT 133* 108* 149* 168 176 183 205   Cardiac Enzymes:  Recent Labs Lab 03/06/15 1935 03/07/15 0142 03/07/15 1044  TROPONINI 0.05* 0.04* <0.03   BNP (last 3 results)  Recent Labs  02/09/15 1118 02/11/15 0830  BNP 17.0 9.3    ProBNP (last 3 results) No results for input(s): PROBNP in the last 8760 hours.  CBG:  Recent Labs Lab 03/06/15 2025  GLUCAP 90    Recent Results (from the past 240 hour(s))  Urine culture     Status: None   Collection Time: 03/06/15  9:28 AM  Result Value Ref Range Status   Specimen Description URINE, CLEAN CATCH  Final  Special Requests NONE  Final   Culture   Final    30,000 COLONIES/mL STAPHYLOCOCCUS SPECIES (COAGULASE NEGATIVE)   Report Status 03/08/2015 FINAL  Final   Organism ID, Bacteria STAPHYLOCOCCUS SPECIES (COAGULASE NEGATIVE)  Final      Susceptibility   Staphylococcus species (coagulase negative) - MIC*    CIPROFLOXACIN >=8 RESISTANT Resistant     GENTAMICIN <=0.5 SENSITIVE Sensitive     NITROFURANTOIN <=16 SENSITIVE Sensitive     OXACILLIN >=4 RESISTANT Resistant     TETRACYCLINE <=1 SENSITIVE Sensitive     VANCOMYCIN <=0.5 SENSITIVE Sensitive     TRIMETH/SULFA <=10 SENSITIVE Sensitive     CLINDAMYCIN <=0.25 SENSITIVE Sensitive     RIFAMPIN <=0.5 SENSITIVE Sensitive     Inducible Clindamycin NEGATIVE Sensitive     * 30,000 COLONIES/mL STAPHYLOCOCCUS SPECIES (COAGULASE NEGATIVE)  Culture, blood (routine x 2)     Status: None   Collection Time: 03/06/15  7:35 PM  Result Value Ref Range Status    Specimen Description BLOOD LEFT ANTECUBITAL  Final   Special Requests BOTTLES DRAWN AEROBIC AND ANAEROBIC 5CC   Final   Culture NO GROWTH 5 DAYS  Final   Report Status 03/11/2015 FINAL  Final  Culture, blood (routine x 2)     Status: None   Collection Time: 03/06/15  7:53 PM  Result Value Ref Range Status   Specimen Description BLOOD LEFT ANTECUBITAL  Final   Special Requests BOTTLES DRAWN AEROBIC ONLY 5CC  Final   Culture NO GROWTH 5 DAYS  Final   Report Status 03/11/2015 FINAL  Final  MRSA PCR Screening     Status: None   Collection Time: 03/06/15 10:33 PM  Result Value Ref Range Status   MRSA by PCR NEGATIVE NEGATIVE Final    Comment:        The GeneXpert MRSA Assay (FDA approved for NASAL specimens only), is one component of a comprehensive MRSA colonization surveillance program. It is not intended to diagnose MRSA infection nor to guide or monitor treatment for MRSA infections.   CSF culture     Status: None   Collection Time: 03/07/15  1:20 AM  Result Value Ref Range Status   Specimen Description CSF  Final   Special Requests Immunocompromised  Final   Gram Stain   Final    CYTOSPIN SMEAR WBC PRESENT, PREDOMINANTLY PMN NO ORGANISMS SEEN    Culture NO GROWTH 3 DAYS  Final   Report Status 03/10/2015 FINAL  Final  Fungus Culture with Smear     Status: None (Preliminary result)   Collection Time: 03/07/15  1:20 AM  Result Value Ref Range Status   Specimen Description CSF  Final   Special Requests NONE  Final   Fungal Smear   Final    NO YEAST OR FUNGAL ELEMENTS SEEN Performed at [facility]    Culture   Final    CULTURE IN PROGRESS FOR FOUR WEEKS Performed at [facility]    Report Status PENDING  Incomplete  Culture, expectorated sputum-assessment     Status: None   Collection Time: 03/08/15  8:10 PM  Result Value Ref Range Status   Specimen Description SPUTUM  Final   Special Requests NONE  Final   Sputum evaluation   Final    THIS  SPECIMEN IS ACCEPTABLE. RESPIRATORY CULTURE REPORT TO FOLLOW.   Report Status 03/08/2015 FINAL  Final  Culture, respiratory (NON-Expectorated)     Status: None   Collection Time: 03/08/15  8:10  PM  Result Value Ref Range Status   Specimen Description SPUTUM  Final   Special Requests NONE  Final   Gram Stain   Final    MODERATE WBC PRESENT, PREDOMINANTLY PMN NO SQUAMOUS EPITHELIAL CELLS SEEN NO ORGANISMS SEEN Performed at [facility]    Culture   Final    NORMAL OROPHARYNGEAL FLORA Performed at [facility]    Report Status 03/11/2015 FINAL  Final     Studies:  Recent x-ray studies have been reviewed in detail by the Attending Physician  Scheduled Meds:  Scheduled Meds: . antiseptic oral rinse  7 mL Mouth Rinse BID  . aspirin EC  81 mg Oral Daily  . diltiazem  120 mg Oral Daily  . enoxaparin (LOVENOX) injection  40 mg Subcutaneous Q24H  . escitalopram  10 mg Oral Daily  . levofloxacin (LEVAQUIN) IV  750 mg Intravenous Q48H  . scopolamine  1 patch Transdermal Q72H    Time spent on care of this patient: 40 mins   WOODS, Geraldo Docker , MD  [facility] Office  [phone] Pager - [phone]  On-Call/Text Page:      [email or web address]      password [email or web address]  If 7PM-7AM, please contact night-coverage [email or web address] Password [facility] 03/12/2015, 7:49 PM   LOS: 6 days   Care during the described time interval was provided by me .  I have reviewed this patient's available data, including medical history, events of note, physical examination, and all test results as part of my evaluation. I have personally reviewed and interpreted all radiology studies.   Dia Crawford, MD [phone] Pager

## 2015-03-13 DIAGNOSIS — R4182 Altered mental status, unspecified: Secondary | ICD-10-CM

## 2015-03-13 DIAGNOSIS — H7093 Unspecified mastoiditis, bilateral: Secondary | ICD-10-CM | POA: Diagnosis present

## 2015-03-13 DIAGNOSIS — R112 Nausea with vomiting, unspecified: Secondary | ICD-10-CM | POA: Diagnosis present

## 2015-03-13 LAB — COMPREHENSIVE METABOLIC PANEL
ALBUMIN: 2.3 g/dL — AB (ref 3.5–5.0)
ALT: 14 U/L — AB (ref 17–63)
AST: 11 U/L — AB (ref 15–41)
Alkaline Phosphatase: 43 U/L (ref 38–126)
Anion gap: 8 (ref 5–15)
BUN: 14 mg/dL (ref 6–20)
CHLORIDE: 102 mmol/L (ref 101–111)
CO2: 26 mmol/L (ref 22–32)
CREATININE: 3.68 mg/dL — AB (ref 0.61–1.24)
Calcium: 8.1 mg/dL — ABNORMAL LOW (ref 8.9–10.3)
GFR calc Af Amer: 21 mL/min — ABNORMAL LOW (ref >60)
GFR calc non Af Amer: 18 mL/min — ABNORMAL LOW (ref >60)
GLUCOSE: 83 mg/dL (ref 65–99)
POTASSIUM: 3.4 mmol/L — AB (ref 3.5–5.1)
Sodium: 136 mmol/L (ref 135–145)
Total Bilirubin: 0.5 mg/dL (ref 0.3–1.2)
Total Protein: 5.4 g/dL — ABNORMAL LOW (ref 6.5–8.1)

## 2015-03-13 LAB — PREPARE RBC (CROSSMATCH)

## 2015-03-13 LAB — CBC WITH DIFFERENTIAL/PLATELET
BASOS ABS: 0 10*3/uL (ref 0.0–0.1)
BASOS PCT: 0 %
EOS PCT: 15 %
Eosinophils Absolute: 1 10*3/uL — ABNORMAL HIGH (ref 0.0–0.7)
HEMATOCRIT: 23 % — AB (ref 39.0–52.0)
Hemoglobin: 7.7 g/dL — ABNORMAL LOW (ref 13.0–17.0)
LYMPHS PCT: 19 %
Lymphs Abs: 1.3 10*3/uL (ref 0.7–4.0)
MCH: 31.2 pg (ref 26.0–34.0)
MCHC: 33.5 g/dL (ref 30.0–36.0)
MCV: 93.1 fL (ref 78.0–100.0)
MONO ABS: 0.6 10*3/uL (ref 0.1–1.0)
Monocytes Relative: 9 %
NEUTROS ABS: 3.9 10*3/uL (ref 1.7–7.7)
Neutrophils Relative %: 57 %
PLATELETS: 203 10*3/uL (ref 150–400)
RBC: 2.47 MIL/uL — AB (ref 4.22–5.81)
RDW: 16.3 % — AB (ref 11.5–15.5)
WBC: 6.8 10*3/uL (ref 4.0–10.5)

## 2015-03-13 LAB — MAGNESIUM: Magnesium: 1.7 mg/dL (ref 1.7–2.4)

## 2015-03-13 LAB — ABO/RH: ABO/RH(D): A POS

## 2015-03-13 MED ORDER — LEVOFLOXACIN 500 MG PO TABS
750.0000 mg | ORAL_TABLET | ORAL | Status: DC
Start: 2015-03-14 — End: 2015-03-16
  Administered 2015-03-14: 750 mg via ORAL
  Filled 2015-03-13: qty 1

## 2015-03-13 MED ORDER — PANTOPRAZOLE SODIUM 40 MG IV SOLR
40.0000 mg | Freq: Every day | INTRAVENOUS | Status: DC
  Administered 2015-03-13: 40 mg via INTRAVENOUS
  Filled 2015-03-13 (×2): qty 40

## 2015-03-13 MED ORDER — FUROSEMIDE 10 MG/ML IJ SOLN: 20.0000 mg | Freq: Once | INTRAMUSCULAR | Status: DC

## 2015-03-13 MED ORDER — METOCLOPRAMIDE HCL 5 MG/ML IJ SOLN
5.0000 mg | Freq: Three times a day (TID) | INTRAMUSCULAR | Status: DC
  Administered 2015-03-13 – 2015-03-16 (×9): 5 mg via INTRAVENOUS
  Filled 2015-03-13: qty 1
  Filled 2015-03-13: qty 2
  Filled 2015-03-13 (×2): qty 1
  Filled 2015-03-13: qty 2
  Filled 2015-03-13 (×3): qty 1
  Filled 2015-03-13: qty 2
  Filled 2015-03-13 (×2): qty 1

## 2015-03-13 MED ORDER — SODIUM CHLORIDE 0.9 % IV SOLN
Freq: Once | INTRAVENOUS | Status: AC
  Administered 2015-03-13: 15:00:00 via INTRAVENOUS

## 2015-03-13 MED ORDER — FUROSEMIDE 10 MG/ML IJ SOLN
60.0000 mg | Freq: Once | INTRAMUSCULAR | Status: AC
  Administered 2015-03-13: 60 mg via INTRAVENOUS
  Filled 2015-03-13: qty 6

## 2015-03-13 MED ORDER — ZOLPIDEM TARTRATE 5 MG PO TABS
5.0000 mg | ORAL_TABLET | Freq: Once | ORAL | Status: AC
  Administered 2015-03-13: 5 mg via ORAL
  Filled 2015-03-13: qty 1

## 2015-03-13 MED ORDER — MAGNESIUM SULFATE 2 GM/50ML IV SOLN
2.0000 g | Freq: Once | INTRAVENOUS | Status: AC
  Administered 2015-03-13: 2 g via INTRAVENOUS
  Filled 2015-03-13: qty 50

## 2015-03-13 MED ORDER — POTASSIUM CHLORIDE 10 MEQ/100ML IV SOLN
10.0000 meq | INTRAVENOUS | Status: AC
  Administered 2015-03-13 (×4): 10 meq via INTRAVENOUS
  Filled 2015-03-13 (×4): qty 100

## 2015-03-13 MED ORDER — SODIUM CHLORIDE 0.9 % IV BOLUS (SEPSIS)
500.0000 mL | Freq: Once | INTRAVENOUS | Status: AC
  Administered 2015-03-13: 500 mL via INTRAVENOUS

## 2015-03-13 NOTE — Progress Notes (Signed)
James Byrd is still having a tough time. He is still having some nausea.  His renal function does not seem to be improving. I don't know this might be from IV contrast. His creatinine is now up to 3.68. His potassium is 3.4.  His hemoglobin is now down to 7.7. I really think he is going had to be transfused. I think with his renal function the way it is, he just is not going to have a lot of red blood cell production ability. I think 2 units of blood might be able to get him feeling better. I talked to him about this. I explained nature of blood transfusions. He understands all this. He is warned to have the transfusion.  Is still hard for her out why he is having such a tell time.  He had chest x-ray and abdominal film yesterday. The right upper lobe infiltrate seems to have improved. He has no evidence of obstruction.  There is clearly an element of depression. I understand this completely given all that he is been through. He has had an incredibly difficult time with the [facility]. With all the people we have treated with [facility], he definitely has had the most toxicity. It is still possible that he may have some toxicity.  Pituitary insufficiency is a calm occasion of Yervoy therapy. I don't know if this might be part of was going on. His TSH, although normal, is much higher than about a month ago.  I wonder if he needs to be worked up for pituitary insufficiency.  Hopefully, his renal function will improve. I will like to think that it will.  On his physical exam, although his vital signs are pretty stable. Blood pressure 116/69. Temperature 97.7. His oral exam is negative. Lungs are with good breath sounds bilaterally. Cardiac exam regular rate and rhythm with no murmurs, rubs or bruits. Abdomen is obese but soft. Bowel sounds present. There is no guarding or rebound tenderness. Extremity shows no clubbing, cyanosis or edema. There may be some chronic mild nonpitting edema of the right  leg.  Hopefully, the blood transfusion will help him.  Hopefully, his renal function will improve. He is urinating okay. He is not retaining fluid. Is not hyperkalemic. He is not acidotic.  We had good fellowship this morning. II know that his faith being tested.  Rule Psalm 91:15-16

## 2015-03-13 NOTE — Progress Notes (Addendum)
CM consult : Please assist in arranging an outpt sleep study to qualify pt for home CPAP. Appointment scheduled for Sleep Study arranged for 04/23/15  @  8pm per CM. CM to make pt/wife aware. No other needs identified @ present time. Whitman Hero RN,BSN,CM [phone]

## 2015-03-13 NOTE — Progress Notes (Signed)
[facility] TEAM 1 - Stepdown/ICU TEAM Progress Note  Athen Balagot [id number] DOB: [birth year] DOA: 03/06/2015 PCP: Ann Held, MD  Admit HPI / Brief Narrative: 49 y/o WM PMHx Anxiety, PSVT, HLD, Chronic Diastolic CHF, Thrombocytopenia, Hx Rt Great Toe Melanoma ( Stage IIIB [id number]) S/P amputation on current chemo,recently discharged after being treated for pneumonia/pneumonitis on 8/29 and ?pulmonary toxicity recently started on home 2 L O2. Presented 9/16 with fevers and admitted with ?HCAP and sepsis. On 9/17 rapid response called r/t worsening respiratory failure and AMS.    HPI/Subjective: 9/23 A/O 4, states negative nausea, however postprandial N/V when he laid flat in the bed. States BM this A.m. currently negative nausea. .    Assessment/Plan: Acute on chronic hypoxic respiratory failure  -Patient discharged on 8/29 4 pneumonia/pneumonitis secondary to ipilimumab Curt Bears) -Resolved at this time  ?HCAP +/- pneumonitis -clinical picture and cause of his infiltrates and fever unclear  - treated for both PNA and inflammatory pneumonitis per PCCM. 9/21 last day of antibiotics for PNA -Out of bed ambulate q shift - OSA  -CPAPper respiratory  Sepsis unspecified organism/Bilateral Mastoid effusion/Paranasal Sinus disease - LP done given AMS and immunocompromised;unremarkable findings - MRI brain 9/20 suggested extensive paranasal sinus disease; continue current antibiotics   AMS -Resolved -Brain MRI; sinus disease see results below  Chronic diastolic CHF -Transfuse for hemoglobin<8 -Monitor for fluid overload; strict I&O; -1.2 L -Daily weight  Hypokalemia -Potassium goal>4 -Potassium IV 40 mEq  Hypomagnesemia -Magnesium goal> 2 -Magnesium IV 2 gm  Acute renal failure -Most likely multifactorial to include Hypotension, Contrast and patient's chemotherapy ( ipilimumab ([place]). - Creatinine is volatile and is trending up. Most likely secondary to fluid  overload patient has received Lasix, if no improvement in the A.m. will consult nephrology  Hx of Melanoma - completed 3 cycles chemo  -Per patient on a 6-week-hold   Refractory Nausea -Psychogenic vs side effect chemotherapy vs Adrenal insufficiency. Currently favor psychogenic (depression from his cancer/chemotherapy) -Place patient on BRAT-G -Thorazine 25 mg IV 1 -Continue Zofran and Phenergan PRN;  -Scopolamine patch  -KUB; negative for ileus/SBO  -Reglan 5 mg QAC -TSH WNL, obtain free T4/T3 -Obtain A.m. Cortisol -Obtain ACTH     Code Status: FULL Family Communication: Wife present at time of exam Disposition Plan: Resolution acute mental status change    Consultants: Dr [name]. Oncology  Dr.Murali Ramaswamy PCCM    Procedure/Significant Events: 8/24 echocardiogram;- Left ventricle: moderate LVH. LVEDF= 65%-to 70%.  - (grade 1 diastolicdysfunction).  9/16 CTA chest > NEG PE, persistent bilat patchy asd, worse RUL 9/17 abd u/s > No cholelithiasis or sonographic evidence of acute cholecystitis. Mild increased renal cortical echogenicity as can be seen with medical renal disease versus acute kidney injury. 9/17 LP 9/17 > 9/20 MRI brain;No acute intracranial abnormality. - Extensive paranasal sinus mucosal disease, - Left >>Rt mastoid effusions   Culture 9/16 urine coag negative  9/16 blood left AC 2 NGTD 9/17 CSF negative yeast/fungal 9/18 sputum negative    Antibiotics: Zosyn 9/16 > stopped 9/21 Vancomycin 9/16 > stopped 9/21 Levofloxacin 9/21>>  DVT prophylaxis: Lovenox   Devices    LINES / TUBES:      Continuous Infusions: . sodium chloride Stopped (03/13/15 1430)    Objective: VITAL SIGNS: Temp: 99.4 F (37.4 C) (09/23 1744) Temp Source: Axillary (09/23 1744) BP: 95/52 mmHg (09/23 1744) Pulse Rate: 104 (09/23 1744) SPO2; FIO2:   Intake/Output Summary (Last 24 hours) at 03/13/15 1945 Last data filed at  03/13/15  1900  Gross per 24 hour  Intake 3439.91 ml  Output   3000 ml  Net 439.91 ml     Exam: General:A/O 4, states continues to have headache , No acute respiratory distress Eyes: Positive headache, negative photophobia, negative double vision,negative scleral hemorrhage ENT: Negative Runny nose, negative ear pain, negative gingival bleeding, Neck:  Negative scars, masses, torticollis, lymphadenopathy, JVD Lungs: Clear to auscultation bilaterally without wheezes or crackles Cardiovascular: Regular rate and rhythm without murmur gallop or rub normal S1 and S2 Abdomen:negative abdominal pain, negative distended, positive soft, bowel sounds, no rebound, no ascites, no appreciable mass Extremities: No significant cyanosis, clubbing, or edema bilateral lower extremities Psychiatric:  Negative depression, negative anxiety, negative fatigue, negative mania  Neurologic:  Cranial nerves II through XII intact, tongue/uvula midline, all extremities muscle strength 5/5, sensation intact throughout, negative dysarthria, negative expressive aphasia, negative receptive aphasia.   Data Reviewed: Basic Metabolic Panel:  Recent Labs Lab 03/10/15 0300 03/11/15 0214 03/12/15 0314 03/12/15 1625 03/13/15 0438  NA 144 137 139 138 136  K 3.9 3.4* 3.4* 3.1* 3.4*  CL 108 101 100* 100* 102  CO2 29 28 29 27 26   GLUCOSE 82 89 77 99 83  BUN 20 16 15 14 14   CREATININE 2.85* 2.77* 3.20* 3.26* 3.68*  CALCIUM 9.0 8.5* 8.5* 8.4* 8.1*  MG  --  1.6*  --  1.6* 1.7   Liver Function Tests:  Recent Labs Lab 03/10/15 0300 03/11/15 0214 03/13/15 0438  AST 12* 11* 11*  ALT 18 18 14*  ALKPHOS 48 48 43  BILITOT 0.7 0.9 0.5  PROT 6.0* 5.5* 5.4*  ALBUMIN 2.8* 2.6* 2.3*   No results for input(s): LIPASE, AMYLASE in the last 168 hours.  Recent Labs Lab 03/12/15 0314  AMMONIA 44*   CBC:  Recent Labs Lab 03/08/15 1320 03/10/15 0300 03/11/15 0214 03/12/15 0314 03/13/15 0438  WBC 11.9* 6.8 7.3 7.3 6.8   NEUTROABS  --   --   --   --  3.9  HGB 8.2* 8.2* 8.3* 8.2* 7.7*  HCT 25.2* 25.7* 24.9* 25.1* 23.0*  MCV 93.3 93.5 91.9 91.9 93.1  PLT 168 176 183 205 203   Cardiac Enzymes:  Recent Labs Lab 03/07/15 0142 03/07/15 1044  TROPONINI 0.04* <0.03   BNP (last 3 results)  Recent Labs  02/09/15 1118 02/11/15 0830  BNP 17.0 9.3    ProBNP (last 3 results) No results for input(s): PROBNP in the last 8760 hours.  CBG:  Recent Labs Lab 03/06/15 2025  GLUCAP 90    Recent Results (from the past 240 hour(s))  Urine culture     Status: None   Collection Time: 03/06/15  9:28 AM  Result Value Ref Range Status   Specimen Description URINE, CLEAN CATCH  Final   Special Requests NONE  Final   Culture   Final    30,000 COLONIES/mL STAPHYLOCOCCUS SPECIES (COAGULASE NEGATIVE)   Report Status 03/08/2015 FINAL  Final   Organism ID, Bacteria STAPHYLOCOCCUS SPECIES (COAGULASE NEGATIVE)  Final      Susceptibility   Staphylococcus species (coagulase negative) - MIC*    CIPROFLOXACIN >=8 RESISTANT Resistant     GENTAMICIN <=0.5 SENSITIVE Sensitive     NITROFURANTOIN <=16 SENSITIVE Sensitive     OXACILLIN >=4 RESISTANT Resistant     TETRACYCLINE <=1 SENSITIVE Sensitive     VANCOMYCIN <=0.5 SENSITIVE Sensitive     TRIMETH/SULFA <=10 SENSITIVE Sensitive     CLINDAMYCIN <=0.25 SENSITIVE Sensitive  RIFAMPIN <=0.5 SENSITIVE Sensitive     Inducible Clindamycin NEGATIVE Sensitive     * 30,000 COLONIES/mL STAPHYLOCOCCUS SPECIES (COAGULASE NEGATIVE)  Culture, blood (routine x 2)     Status: None   Collection Time: 03/06/15  7:35 PM  Result Value Ref Range Status   Specimen Description BLOOD LEFT ANTECUBITAL  Final   Special Requests BOTTLES DRAWN AEROBIC AND ANAEROBIC 5CC   Final   Culture NO GROWTH 5 DAYS  Final   Report Status 03/11/2015 FINAL  Final  Culture, blood (routine x 2)     Status: None   Collection Time: 03/06/15  7:53 PM  Result Value Ref Range Status   Specimen Description  BLOOD LEFT ANTECUBITAL  Final   Special Requests BOTTLES DRAWN AEROBIC ONLY 5CC  Final   Culture NO GROWTH 5 DAYS  Final   Report Status 03/11/2015 FINAL  Final  MRSA PCR Screening     Status: None   Collection Time: 03/06/15 10:33 PM  Result Value Ref Range Status   MRSA by PCR NEGATIVE NEGATIVE Final    Comment:        The GeneXpert MRSA Assay (FDA approved for NASAL specimens only), is one component of a comprehensive MRSA colonization surveillance program. It is not intended to diagnose MRSA infection nor to guide or monitor treatment for MRSA infections.   CSF culture     Status: None   Collection Time: 03/07/15  1:20 AM  Result Value Ref Range Status   Specimen Description CSF  Final   Special Requests Immunocompromised  Final   Gram Stain   Final    CYTOSPIN SMEAR WBC PRESENT, PREDOMINANTLY PMN NO ORGANISMS SEEN    Culture NO GROWTH 3 DAYS  Final   Report Status 03/10/2015 FINAL  Final  Fungus Culture with Smear     Status: None (Preliminary result)   Collection Time: 03/07/15  1:20 AM  Result Value Ref Range Status   Specimen Description CSF  Final   Special Requests NONE  Final   Fungal Smear   Final    NO YEAST OR FUNGAL ELEMENTS SEEN Performed at [facility]    Culture   Final    CULTURE IN PROGRESS FOR FOUR WEEKS Performed at [facility]    Report Status PENDING  Incomplete  Culture, expectorated sputum-assessment     Status: None   Collection Time: 03/08/15  8:10 PM  Result Value Ref Range Status   Specimen Description SPUTUM  Final   Special Requests NONE  Final   Sputum evaluation   Final    THIS SPECIMEN IS ACCEPTABLE. RESPIRATORY CULTURE REPORT TO FOLLOW.   Report Status 03/08/2015 FINAL  Final  Culture, respiratory (NON-Expectorated)     Status: None   Collection Time: 03/08/15  8:10 PM  Result Value Ref Range Status   Specimen Description SPUTUM  Final   Special Requests NONE  Final   Gram Stain   Final    MODERATE WBC  PRESENT, PREDOMINANTLY PMN NO SQUAMOUS EPITHELIAL CELLS SEEN NO ORGANISMS SEEN Performed at [facility]    Culture   Final    NORMAL OROPHARYNGEAL FLORA Performed at [facility]    Report Status 03/11/2015 FINAL  Final     Studies:  Recent x-ray studies have been reviewed in detail by the Attending Physician  Scheduled Meds:  Scheduled Meds: . antiseptic oral rinse  7 mL Mouth Rinse BID  . aspirin EC  81 mg Oral Daily  .  diltiazem  120 mg Oral Daily  . enoxaparin (LOVENOX) injection  40 mg Subcutaneous Q24H  . escitalopram  10 mg Oral Daily  . [START ON 03/14/2015] levofloxacin  750 mg Oral Q48H  . metoCLOPramide (REGLAN) injection  5 mg Intravenous TID AC  . pantoprazole (PROTONIX) IV  40 mg Intravenous QHS  . scopolamine  1 patch Transdermal Q72H    Time spent on care of this patient: 40 mins   WOODS, Geraldo Docker , MD  [facility] Office  [phone] Pager [phone]  On-Call/Text Page:      [email or web address]      password [email or web address]  If 7PM-7AM, please contact night-coverage [email or web address] Password [email or web address] 03/13/2015, 7:45 PM   LOS: 7 days   Care during the described time interval was provided by me .  I have reviewed this patient's available data, including medical history, events of note, physical examination, and all test results as part of my evaluation. I have personally reviewed and interpreted all radiology studies.   Dia Crawford, MD [phone] Pager

## 2015-03-13 NOTE — Progress Notes (Signed)
Patient SBP 70's to 80's MD paged orders received will monitor Patient closely.

## 2015-03-13 NOTE — Progress Notes (Signed)
Patient placed on CPAP of 10. O2 bleed in of 4L. Patient tolerating well sat 96%. RT will continue to monitor as needed.

## 2015-03-13 NOTE — Progress Notes (Signed)
Patient found having removed BIPAP saying he "can't breath with it on" refusing to wear. Placed on 5 Liters Nasal Canula will monitor Patient closely

## 2015-03-13 NOTE — Progress Notes (Signed)
Patient refused CPAP tonight. Educated patient on the importance of wearing it and instructed him to let us know if he feels short of air and we would come place it on. RT will continue to monitor as needed.

## 2015-03-14 ENCOUNTER — Inpatient Hospital Stay (HOSPITAL_COMMUNITY): Payer: BLUE CROSS/BLUE SHIELD

## 2015-03-14 DIAGNOSIS — E2740 Unspecified adrenocortical insufficiency: Secondary | ICD-10-CM | POA: Diagnosis present

## 2015-03-14 LAB — COMPREHENSIVE METABOLIC PANEL
ALBUMIN: 2.5 g/dL — AB (ref 3.5–5.0)
ALK PHOS: 46 U/L (ref 38–126)
ALT: 13 U/L — AB (ref 17–63)
ANION GAP: 10 (ref 5–15)
AST: 11 U/L — ABNORMAL LOW (ref 15–41)
BUN: 12 mg/dL (ref 6–20)
CALCIUM: 8.6 mg/dL — AB (ref 8.9–10.3)
CHLORIDE: 100 mmol/L — AB (ref 101–111)
CO2: 27 mmol/L (ref 22–32)
CREATININE: 3.71 mg/dL — AB (ref 0.61–1.24)
GFR calc non Af Amer: 18 mL/min — ABNORMAL LOW (ref >60)
GFR, EST AFRICAN AMERICAN: 21 mL/min — AB (ref >60)
GLUCOSE: 94 mg/dL (ref 65–99)
Potassium: 3.3 mmol/L — ABNORMAL LOW (ref 3.5–5.1)
SODIUM: 137 mmol/L (ref 135–145)
Total Bilirubin: 0.5 mg/dL (ref 0.3–1.2)
Total Protein: 6.1 g/dL — ABNORMAL LOW (ref 6.5–8.1)

## 2015-03-14 LAB — CBC WITH DIFFERENTIAL/PLATELET
BASOS PCT: 1 %
Basophils Absolute: 0 10*3/uL (ref 0.0–0.1)
EOS ABS: 1 10*3/uL — AB (ref 0.0–0.7)
EOS PCT: 17 %
HCT: 28.8 % — ABNORMAL LOW (ref 39.0–52.0)
HEMOGLOBIN: 9.6 g/dL — AB (ref 13.0–17.0)
Lymphocytes Relative: 22 %
Lymphs Abs: 1.4 10*3/uL (ref 0.7–4.0)
MCH: 30.4 pg (ref 26.0–34.0)
MCHC: 33.3 g/dL (ref 30.0–36.0)
MCV: 91.1 fL (ref 78.0–100.0)
Monocytes Absolute: 0.5 10*3/uL (ref 0.1–1.0)
Monocytes Relative: 9 %
NEUTROS PCT: 51 %
Neutro Abs: 3.2 10*3/uL (ref 1.7–7.7)
PLATELETS: 248 10*3/uL (ref 150–400)
RBC: 3.16 MIL/uL — AB (ref 4.22–5.81)
RDW: 15.6 % — ABNORMAL HIGH (ref 11.5–15.5)
WBC: 6.2 10*3/uL (ref 4.0–10.5)

## 2015-03-14 LAB — TYPE AND SCREEN
ABO/RH(D): A POS
ANTIBODY SCREEN: NEGATIVE
UNIT DIVISION: 0
Unit division: 0

## 2015-03-14 LAB — CORTISOL-AM, BLOOD: CORTISOL - AM: 2 ug/dL — AB (ref 6.7–22.6)

## 2015-03-14 LAB — MAGNESIUM: Magnesium: 1.9 mg/dL (ref 1.7–2.4)

## 2015-03-14 LAB — T4, FREE: FREE T4: 0.78 ng/dL (ref 0.61–1.12)

## 2015-03-14 LAB — PARATHYROID HORMONE, INTACT (NO CA): PTH: 48 pg/mL (ref 15–65)

## 2015-03-14 MED ORDER — POTASSIUM CHLORIDE 10 MEQ/100ML IV SOLN
10.0000 meq | INTRAVENOUS | Status: AC
  Administered 2015-03-14 (×6): 10 meq via INTRAVENOUS
  Filled 2015-03-14 (×6): qty 100

## 2015-03-14 MED ORDER — SODIUM CHLORIDE 0.9 % IV BOLUS (SEPSIS)
1000.0000 mL | Freq: Once | INTRAVENOUS | Status: AC
  Administered 2015-03-14: 1000 mL via INTRAVENOUS

## 2015-03-14 MED ORDER — PANTOPRAZOLE SODIUM 40 MG PO TBEC
40.0000 mg | DELAYED_RELEASE_TABLET | Freq: Every day | ORAL | Status: DC
  Administered 2015-03-14 – 2015-03-15 (×2): 40 mg via ORAL
  Filled 2015-03-14 (×2): qty 1

## 2015-03-14 MED ORDER — IOHEXOL 300 MG/ML  SOLN
25.0000 mL | INTRAMUSCULAR | Status: AC
  Administered 2015-03-14 (×2): 25 mL via ORAL

## 2015-03-14 NOTE — Evaluation (Signed)
Physical Therapy Evaluation Patient Details Name: Yacqub Cicotte MRN: [id number] DOB: 07-Jun-1966 Today's Date: 03/14/2015   History of Present Illness  James Byrd is a 49 y.o. male With h/o SVT , hyperlipidemia, right great toe melanoma , s/p amputation on chemotherapy, diastolic heart failure, hyperlipidemia, thrombocytopenia, recently discharged after being treated for pneumonia on 8/29, presents again today for worsening sob, productive cough and nausea, vomiting. He reports after being [facility], he was [facility] at [facility] for similar complaints, admitted there and was given antibiotics and discharged on 2 lit [place] oxygen and po antibiotics. ona rrival to ED, he was found to be tachypnic, tachycardic, hypoxic on 2 lit [place] oxygen. CT chest revealed persistent bilateral air space disease partially improved when compared to previous exam but worse in the posterior right upper lobe.  Clinical Impression  Pt admitted with/for worsening SOB, cough and N/V.  Pt currently limited functionally due to the problems listed below.  (see problems list.)  Pt will benefit from PT to maximize function and safety to be able to get home safely with available assist of family.     Follow Up Recommendations Home health PT    Equipment Recommendations  Rolling walker with 5" wheels    Recommendations for Other Services       Precautions / Restrictions        Mobility  Bed Mobility Overal bed mobility: Modified Independent                Transfers Overall transfer level: Needs assistance Equipment used: Rolling walker (2 wheeled) Transfers: Sit to/from Stand Sit to Stand: Supervision Stand pivot transfers: Supervision       General transfer comment: cues for hand placement, but with UE's easily stood from lower chair  Ambulation/Gait Ambulation/Gait assistance: Supervision Ambulation Distance (Feet): 160 Feet Assistive device: Rolling walker (2 wheeled) Gait  Pattern/deviations: Step-through pattern;Narrow base of support Gait velocity: moderate speed Gait velocity interpretation: Below normal speed for age/gender General Gait Details: 160' with 3L  with sats maintained mid 90's.  Mildly unsteady and moving a little to rapidly for safety.  Stairs            Wheelchair Mobility    Modified Rankin (Stroke Patients Only)       Balance Overall balance assessment: Needs assistance Sitting-balance support: Feet supported Sitting balance-Leahy Scale: Fair     Standing balance support: Single extremity supported;Bilateral upper extremity supported Standing balance-Leahy Scale: Fair                               Pertinent Vitals/Pain Pain Assessment: No/denies pain    Home Living Family/patient expects to be discharged to:: Private residence Living Arrangements: Spouse/significant other Available Help at Discharge: Family;Available PRN/intermittently Type of Home: House Home Access: Stairs to enter Entrance Stairs-Rails: None Entrance Stairs-Number of Steps: 2 Home Layout: One level        Prior Function Level of Independence: Independent         Comments: drives, not currently working due to on leave for medical reasons     Hand Dominance   Dominant Hand: Right    Extremity/Trunk Assessment   Upper Extremity Assessment: Defer to OT evaluation           Lower Extremity Assessment: Generalized weakness (little muscular stamina)         Communication   Communication: No difficulties  Cognition Arousal/Alertness: Awake/alert Behavior During Therapy:  WFL for tasks assessed/performed;Impulsive Overall Cognitive Status: Within Functional Limits for tasks assessed                      General Comments General comments (skin integrity, edema, etc.): VSS    Exercises        Assessment/Plan    PT Assessment Patient needs continued PT services  PT Diagnosis Difficulty  walking;Generalized weakness   PT Problem List Decreased strength;Decreased activity tolerance;Decreased balance;Decreased mobility;Cardiopulmonary status limiting activity  PT Treatment Interventions DME instruction;Gait training;Functional mobility training;Therapeutic activities;Patient/family education   PT Goals (Current goals can be found in the Care Plan section) Acute Rehab PT Goals Patient Stated Goal: to return to his "normal", to sit on his back deck PT Goal Formulation: With patient Time For Goal Achievement: 03/03/15 Potential to Achieve Goals: Good    Frequency Min 3X/week   Barriers to discharge        Co-evaluation               End of Session Equipment Utilized During Treatment: Oxygen Activity Tolerance: Patient tolerated treatment well Patient left: in bed;with call bell/phone within reach;with family/visitor present Nurse Communication: Mobility status         Time: [id number] PT Time Calculation (min) (ACUTE ONLY): 21 min   Charges:   PT Evaluation $Initial PT Evaluation Tier I: 1 Procedure     PT G Codes:        Mottinger, Tessie Fass 03/14/2015, 2:26 PM 03/14/2015  Donnella Sham, PT [phone]  (pager)

## 2015-03-14 NOTE — Progress Notes (Signed)
[facility] TEAM 1 - Stepdown/ICU TEAM Progress Note  James Byrd [id number] DOB: 01/04/1966 DOA: 03/06/2015 PCP: Ann Held, MD  Admit HPI / Brief Narrative: 49 y/o WM PMHx Anxiety, PSVT, HLD, Chronic Diastolic CHF, Thrombocytopenia, Hx Rt Great Toe Melanoma ( Stage IIIB [id number]) S/P amputation on current chemo,recently discharged after being treated for pneumonia/pneumonitis on 8/29 and ?pulmonary toxicity recently started on home 2 L O2. Presented 9/16 with fevers and admitted with ?HCAP and sepsis. On 9/17 rapid response called r/t worsening respiratory failure and AMS.    HPI/Subjective: 9/24 A/O 4, states negative nausea. Ambulated around ward without DOB or CP.     Assessment/Plan: Acute on chronic hypoxic respiratory failure  -Patient discharged on 8/29 4 pneumonia/pneumonitis secondary to ipilimumab Curt Bears) -Resolved at this time  ?HCAP +/- pneumonitis -clinical picture and cause of his infiltrates and fever unclear  - treated for both PNA and inflammatory pneumonitis per PCCM. 9/21 last day of antibiotics for PNA -Out of bed ambulate q shift - OSA  -CPAPper respiratory  Sepsis unspecified organism/Bilateral Mastoid effusion/Paranasal Sinus disease - LP done given AMS and immunocompromised;unremarkable findings - MRI brain 9/20 suggested extensive paranasal sinus disease; continue current antibiotics   AMS -Resolved -Brain MRI; sinus disease see results below  Chronic diastolic CHF -Transfuse for hemoglobin<8 -Monitor for fluid overload; strict I&O; -223 L -Daily weight; on admission= 116 kg     9/24 weight= 114 kg  Hypokalemia -Potassium goal>4 -Potassium IV 60 mEq  Hypomagnesemia -Magnesium goal> 2 -Magnesium IV 2 gm  Acute renal failure -Most likely multifactorial to include Hypotension, Contrast and patient's chemotherapy ( ipilimumab ([place]). - Creatinine is volatile and is trending up. Most likely secondary to fluid overload patient  has received Lasix, if no improvement in the A.m. will consult nephrology  Adrenal insufficiency -Patient's a.m. cortisol indicative of adrenal insufficiency, ACTH pending. -Previous MRI head no discussion of pituitary abnormality -CT abdomen/pelvis; normal adrenal glands area   Hx of Melanoma - completed 3 cycles chemo + high-dose Decadron+ prednisone -Per patient on a 6-week-hold   Refractory Nausea -Psychogenic vs side effect chemotherapy vs Adrenal insufficiency. Currently favor psychogenic (depression from his cancer/chemotherapy) -Place patient on BRAT-G -Thorazine 25 mg IV 1 -Continue Zofran and Phenergan PRN;  -Scopolamine patch  -KUB; negative for ileus/SBO  -Reglan 5 mg QAC -TSH WNL, obtain free T4/T3 - A.m. Cortisol; low 2.0 -ACTH pending -If ACTH not back by the a.m. will start patient on low-dose prednisone 5 mg daily, hopefully patient's adrenal function will wake up.   Code Status: FULL Family Communication: Wife present at time of exam Disposition Plan: Resolution acute mental status change    Consultants: Dr [name]. Oncology  Dr.Murali Ramaswamy PCCM    Procedure/Significant Events: 8/24 echocardiogram;- Left ventricle: moderate LVH. LVEDF= 65%-to 70%.  - (grade 1 diastolicdysfunction).  9/16 CTA chest > NEG PE, persistent bilat patchy asd, worse RUL 9/17 abd u/s > No cholelithiasis or sonographic evidence of acute cholecystitis. Mild increased renal cortical echogenicity as can be seen with medical renal disease versus acute kidney injury. 9/17 LP 9/17 > 9/20 MRI brain;No acute intracranial abnormality. - Extensive paranasal sinus mucosal disease, - Left >>Rt mastoid effusions 9/24 CT abdomen pelvis without contrast;-Kidneys are slightly prominent with mild retroperitoneal stranding. acute kidney injury? Negative for kidney stones or hydronephrosis. -Normal adrenal glands   Culture 9/16 urine coag negative  9/16 blood left AC 2  NGTD 9/17 CSF negative yeast/fungal 9/18 sputum negative    Antibiotics: Zosyn  9/16 > stopped 9/21 Vancomycin 9/16 > stopped 9/21 Levofloxacin 9/21>>  DVT prophylaxis: Lovenox   Devices    LINES / TUBES:      Continuous Infusions: . sodium chloride Stopped (03/13/15 1430)    Objective: VITAL SIGNS: Temp: 99.1 F (37.3 C) (09/24 1510) Temp Source: Oral (09/24 1510) BP: 106/63 mmHg (09/24 1510) Pulse Rate: 97 (09/24 1510) SPO2; FIO2:   Intake/Output Summary (Last 24 hours) at 03/14/15 1823 Last data filed at 03/14/15 1430  Gross per 24 hour  Intake   2775 ml  Output   1400 ml  Net   1375 ml     Exam: General:A/O 4, No acute respiratory distress Eyes: Negative headache, negative photophobia, negative double vision,negative scleral hemorrhage ENT: Negative Runny nose, negative ear pain, negative gingival bleeding, Neck:  Negative scars, masses, torticollis, lymphadenopathy, JVD Lungs: Clear to auscultation bilaterally without wheezes or crackles Cardiovascular: Regular rate and rhythm without murmur gallop or rub normal S1 and S2 Abdomen:negative abdominal pain, negative distended, positive soft, bowel sounds, no rebound, no ascites, no appreciable mass Extremities: No significant cyanosis, clubbing, or edema bilateral lower extremities Psychiatric:  Negative depression, negative anxiety, negative fatigue, negative mania  Neurologic:  Cranial nerves II through XII intact, tongue/uvula midline, all extremities muscle strength 5/5, sensation intact throughout, negative dysarthria, negative expressive aphasia, negative receptive aphasia.   Data Reviewed: Basic Metabolic Panel:  Recent Labs Lab 03/11/15 0214 03/12/15 0314 03/12/15 1625 03/13/15 0438 03/14/15 0227  NA 137 139 138 136 137  K 3.4* 3.4* 3.1* 3.4* 3.3*  CL 101 100* 100* 102 100*  CO2 28 29 27 26 27   GLUCOSE 89 77 99 83 94  BUN 16 15 14 14 12   CREATININE 2.77* 3.20* 3.26* 3.68* 3.71*   CALCIUM 8.5* 8.5* 8.4* 8.1* 8.6*  MG 1.6*  --  1.6* 1.7 1.9   Liver Function Tests:  Recent Labs Lab 03/10/15 0300 03/11/15 0214 03/13/15 0438 03/14/15 0227  AST 12* 11* 11* 11*  ALT 18 18 14* 13*  ALKPHOS 48 48 43 46  BILITOT 0.7 0.9 0.5 0.5  PROT 6.0* 5.5* 5.4* 6.1*  ALBUMIN 2.8* 2.6* 2.3* 2.5*   No results for input(s): LIPASE, AMYLASE in the last 168 hours.  Recent Labs Lab 03/12/15 0314  AMMONIA 44*   CBC:  Recent Labs Lab 03/10/15 0300 03/11/15 0214 03/12/15 0314 03/13/15 0438 03/14/15 0227  WBC 6.8 7.3 7.3 6.8 6.2  NEUTROABS  --   --   --  3.9 3.2  HGB 8.2* 8.3* 8.2* 7.7* 9.6*  HCT 25.7* 24.9* 25.1* 23.0* 28.8*  MCV 93.5 91.9 91.9 93.1 91.1  PLT 176 183 205 203 248   Cardiac Enzymes: No results for input(s): CKTOTAL, CKMB, CKMBINDEX, TROPONINI in the last 168 hours. BNP (last 3 results)  Recent Labs  02/09/15 1118 02/11/15 0830  BNP 17.0 9.3    ProBNP (last 3 results) No results for input(s): PROBNP in the last 8760 hours.  CBG: No results for input(s): GLUCAP in the last 168 hours.  Recent Results (from the past 240 hour(s))  Urine culture     Status: None   Collection Time: 03/06/15  9:28 AM  Result Value Ref Range Status   Specimen Description URINE, CLEAN CATCH  Final   Special Requests NONE  Final   Culture   Final    30,000 COLONIES/mL STAPHYLOCOCCUS SPECIES (COAGULASE NEGATIVE)   Report Status 03/08/2015 FINAL  Final   Organism ID, Bacteria STAPHYLOCOCCUS SPECIES (COAGULASE NEGATIVE)  Final      Susceptibility   Staphylococcus species (coagulase negative) - MIC*    CIPROFLOXACIN >=8 RESISTANT Resistant     GENTAMICIN <=0.5 SENSITIVE Sensitive     NITROFURANTOIN <=16 SENSITIVE Sensitive     OXACILLIN >=4 RESISTANT Resistant     TETRACYCLINE <=1 SENSITIVE Sensitive     VANCOMYCIN <=0.5 SENSITIVE Sensitive     TRIMETH/SULFA <=10 SENSITIVE Sensitive     CLINDAMYCIN <=0.25 SENSITIVE Sensitive     RIFAMPIN <=0.5 SENSITIVE  Sensitive     Inducible Clindamycin NEGATIVE Sensitive     * 30,000 COLONIES/mL STAPHYLOCOCCUS SPECIES (COAGULASE NEGATIVE)  Culture, blood (routine x 2)     Status: None   Collection Time: 03/06/15  7:35 PM  Result Value Ref Range Status   Specimen Description BLOOD LEFT ANTECUBITAL  Final   Special Requests BOTTLES DRAWN AEROBIC AND ANAEROBIC 5CC   Final   Culture NO GROWTH 5 DAYS  Final   Report Status 03/11/2015 FINAL  Final  Culture, blood (routine x 2)     Status: None   Collection Time: 03/06/15  7:53 PM  Result Value Ref Range Status   Specimen Description BLOOD LEFT ANTECUBITAL  Final   Special Requests BOTTLES DRAWN AEROBIC ONLY 5CC  Final   Culture NO GROWTH 5 DAYS  Final   Report Status 03/11/2015 FINAL  Final  MRSA PCR Screening     Status: None   Collection Time: 03/06/15 10:33 PM  Result Value Ref Range Status   MRSA by PCR NEGATIVE NEGATIVE Final    Comment:        The GeneXpert MRSA Assay (FDA approved for NASAL specimens only), is one component of a comprehensive MRSA colonization surveillance program. It is not intended to diagnose MRSA infection nor to guide or monitor treatment for MRSA infections.   CSF culture     Status: None   Collection Time: 03/07/15  1:20 AM  Result Value Ref Range Status   Specimen Description CSF  Final   Special Requests Immunocompromised  Final   Gram Stain   Final    CYTOSPIN SMEAR WBC PRESENT, PREDOMINANTLY PMN NO ORGANISMS SEEN    Culture NO GROWTH 3 DAYS  Final   Report Status 03/10/2015 FINAL  Final  Fungus Culture with Smear     Status: None (Preliminary result)   Collection Time: 03/07/15  1:20 AM  Result Value Ref Range Status   Specimen Description CSF  Final   Special Requests NONE  Final   Fungal Smear   Final    NO YEAST OR FUNGAL ELEMENTS SEEN Performed at [facility]    Culture   Final    CULTURE IN PROGRESS FOR FOUR WEEKS Performed at [facility]    Report Status PENDING   Incomplete  Culture, expectorated sputum-assessment     Status: None   Collection Time: 03/08/15  8:10 PM  Result Value Ref Range Status   Specimen Description SPUTUM  Final   Special Requests NONE  Final   Sputum evaluation   Final    THIS SPECIMEN IS ACCEPTABLE. RESPIRATORY CULTURE REPORT TO FOLLOW.   Report Status 03/08/2015 FINAL  Final  Culture, respiratory (NON-Expectorated)     Status: None   Collection Time: 03/08/15  8:10 PM  Result Value Ref Range Status   Specimen Description SPUTUM  Final   Special Requests NONE  Final   Gram Stain   Final    MODERATE WBC PRESENT, PREDOMINANTLY PMN NO SQUAMOUS  EPITHELIAL CELLS SEEN NO ORGANISMS SEEN Performed at [facility]    Culture   Final    NORMAL OROPHARYNGEAL FLORA Performed at [facility]    Report Status 03/11/2015 FINAL  Final     Studies:  Recent x-ray studies have been reviewed in detail by the Attending Physician  Scheduled Meds:  Scheduled Meds: . antiseptic oral rinse  7 mL Mouth Rinse BID  . aspirin EC  81 mg Oral Daily  . diltiazem  120 mg Oral Daily  . enoxaparin (LOVENOX) injection  40 mg Subcutaneous Q24H  . escitalopram  10 mg Oral Daily  . levofloxacin  750 mg Oral Q48H  . metoCLOPramide (REGLAN) injection  5 mg Intravenous TID AC  . pantoprazole  40 mg Oral QHS  . scopolamine  1 patch Transdermal Q72H    Time spent on care of this patient: 40 mins   WOODS, Geraldo Docker , MD  [facility] Office  [phone] Pager [phone]  On-Call/Text Page:      [email or web address]      password [email or web address]  If 7PM-7AM, please contact night-coverage [email or web address] Password [facility] 03/14/2015, 6:23 PM   LOS: 8 days   Care during the described time interval was provided by me .  I have reviewed this patient's available data, including medical history, events of note, physical examination, and all test results as part of my evaluation. I have personally reviewed and interpreted all radiology  studies.   Dia Crawford, MD [phone] Pager

## 2015-03-14 NOTE — Progress Notes (Signed)
BP was being assessed prior to giving dose of diltizem.  SBP was 66/47, was recheked on opposite arm and was 76/48. Patient was asked if symptomatic. Patient answered feeling light headed and dizzy. Dr. Sherral Hammers contacted. New orders given. Will carry those out and continue to monitor.

## 2015-03-15 DIAGNOSIS — G4733 Obstructive sleep apnea (adult) (pediatric): Secondary | ICD-10-CM | POA: Diagnosis present

## 2015-03-15 LAB — COMPREHENSIVE METABOLIC PANEL
ALT: 14 U/L — AB (ref 17–63)
AST: 13 U/L — ABNORMAL LOW (ref 15–41)
Albumin: 2.6 g/dL — ABNORMAL LOW (ref 3.5–5.0)
Alkaline Phosphatase: 73 U/L (ref 38–126)
Anion gap: 9 (ref 5–15)
BILIRUBIN TOTAL: 0.6 mg/dL (ref 0.3–1.2)
BUN: 9 mg/dL (ref 6–20)
CHLORIDE: 102 mmol/L (ref 101–111)
CO2: 26 mmol/L (ref 22–32)
CREATININE: 3.52 mg/dL — AB (ref 0.61–1.24)
Calcium: 8.7 mg/dL — ABNORMAL LOW (ref 8.9–10.3)
GFR, EST AFRICAN AMERICAN: 22 mL/min — AB (ref >60)
GFR, EST NON AFRICAN AMERICAN: 19 mL/min — AB (ref >60)
Glucose, Bld: 89 mg/dL (ref 65–99)
POTASSIUM: 3.8 mmol/L (ref 3.5–5.1)
Sodium: 137 mmol/L (ref 135–145)
TOTAL PROTEIN: 6.2 g/dL — AB (ref 6.5–8.1)

## 2015-03-15 LAB — MAGNESIUM: MAGNESIUM: 1.7 mg/dL (ref 1.7–2.4)

## 2015-03-15 LAB — CBC WITH DIFFERENTIAL/PLATELET
Basophils Absolute: 0 10*3/uL (ref 0.0–0.1)
Basophils Relative: 0 %
EOS PCT: 14 %
Eosinophils Absolute: 0.9 10*3/uL — ABNORMAL HIGH (ref 0.0–0.7)
HEMATOCRIT: 29.6 % — AB (ref 39.0–52.0)
Hemoglobin: 9.9 g/dL — ABNORMAL LOW (ref 13.0–17.0)
LYMPHS ABS: 1.2 10*3/uL (ref 0.7–4.0)
LYMPHS PCT: 19 %
MCH: 30.6 pg (ref 26.0–34.0)
MCHC: 33.4 g/dL (ref 30.0–36.0)
MCV: 91.4 fL (ref 78.0–100.0)
MONO ABS: 0.7 10*3/uL (ref 0.1–1.0)
Monocytes Relative: 11 %
Neutro Abs: 3.5 10*3/uL (ref 1.7–7.7)
Neutrophils Relative %: 56 %
PLATELETS: 255 10*3/uL (ref 150–400)
RBC: 3.24 MIL/uL — ABNORMAL LOW (ref 4.22–5.81)
RDW: 15.6 % — AB (ref 11.5–15.5)
WBC: 6.2 10*3/uL (ref 4.0–10.5)

## 2015-03-15 MED ORDER — PREDNISONE 5 MG PO TABS: 5.0000 mg | ORAL_TABLET | Freq: Every day | ORAL | Status: DC

## 2015-03-15 MED ORDER — MIDODRINE HCL 5 MG PO TABS
5.0000 mg | ORAL_TABLET | Freq: Three times a day (TID) | ORAL | Status: DC
  Administered 2015-03-15: 5 mg via ORAL
  Filled 2015-03-15 (×5): qty 1

## 2015-03-15 MED ORDER — HYDROCORTISONE NA SUCCINATE PF 100 MG IJ SOLR
100.0000 mg | Freq: Three times a day (TID) | INTRAMUSCULAR | Status: AC
  Administered 2015-03-15 – 2015-03-16 (×3): 100 mg via INTRAVENOUS
  Filled 2015-03-15 (×4): qty 2

## 2015-03-15 NOTE — Progress Notes (Signed)
Patient opened up and talked about his diagnosis with this RN. Appears very dejected when speaking about his treatment with the chemo and all the complications that have come from having the chemo done. Did state at one point that he "wasn't sure if this was worth it." Patient also made comments regarding accepting death and that he is ok with having to meet the lord now if it was his time. Did express some concern regarding his oncologist and feeling like they were not being truthful with the prognosis, or that they didn't tell him about all the side effects that taking chemo would bring to him. Emotional support provided to the patient. Will cont to monitor the patient.

## 2015-03-15 NOTE — Procedures (Signed)
Pt does not wish to wear cpap tonight and will notify RT if anything changes. RT will continue to monitor.

## 2015-03-15 NOTE — Progress Notes (Addendum)
[facility] TEAM 1 - Stepdown/ICU TEAM Progress Note  James Byrd [id number] DOB: 1966/02/09 DOA: 03/06/2015 PCP: Ann Held, MD  Admit HPI / Brief Narrative: 49 y/o WM PMHx Anxiety, PSVT, HLD, Chronic Diastolic CHF, Thrombocytopenia, Hx Rt Great Toe Melanoma ( Stage IIIB [id number]) S/P amputation on current chemo,recently discharged after being treated for pneumonia/pneumonitis on 8/29 and ?pulmonary toxicity recently started on home 2 L O2. Presented 9/16 with fevers and admitted with ?HCAP and sepsis. On 9/17 rapid response called r/t worsening respiratory failure and AMS.    HPI/Subjective: 9/25 A/O 4, states negative nausea. Ambulated around ward without DOB or CP. Negative signs symptoms of orthostatic hypotension even though BP remains soft.    Assessment/Plan:   Acute on chronic hypoxic respiratory failure  -Patient discharged on 8/29 4 pneumonia/pneumonitis secondary to ipilimumab Curt Bears) -Resolved at this time  ?HCAP +/- pneumonitis -clinical picture and cause of his infiltrates and fever unclear  - treated for both PNA and inflammatory pneumonitis per PCCM. 9/21 last day of antibiotics for PNA -Out of bed ambulate q shift - OSA  -CPAP per respiratory -Patient scheduled for [place] ( [place])  appointment scheduled for 04/23/15 at 8PM.  Sepsis unspecified organism/Bilateral Mastoid effusion/Paranasal Sinus disease - LP done given AMS and immunocompromised;unremarkable findings - MRI brain 9/20 suggested extensive paranasal sinus disease; continue current antibiotics  -Discharge patient with an of antibiotics to complete a full three-week course  AMS -Resolved -Brain MRI; sinus disease see results below; no mention of pituitary gland atrophy or infarct.  Chronic diastolic CHF -Transfuse for hemoglobin<8 -Monitor for fluid overload; strict I&O; - 1.7 L -Daily weight; on admission= 116 kg     9/25 weight= 113  kg  Hypokalemia -Potassium goal>4 -Potassium IV 60 mEq  Hypomagnesemia -Magnesium goal> 2 -Magnesium IV 2 gm  Acute renal failure -Most likely multifactorial to include Hypotension, Contrast and patient's chemotherapy ( ipilimumab ([place]). - Creatinine stable patient will need to follow-up with nephrologist as an outpatient to monitor function carefully.   Adrenal insufficiency -Patient's a.m. cortisol indicative of adrenal insufficiency low 2, ACTH pending. -Previous MRI head no discussion of pituitary abnormality -CT abdomen/pelvis; normal adrenal glands area -9/24 start Solu-Cortef 100 mg TID; if patient BP responds favorably would transition to lower PO dose in the a.m. as his insufficiency most likely chronic  -Also started Midodrin 5 mg TID -Discharge on prednisone 5 mg daily. PCP to recheck A.m. cortisol and ACTH in 3-4 weeks  Hx of Melanoma - completed 3 cycles chemo + high-dose Decadron+ prednisone -Per patient on a 6-week-hold  -Follow-up with Dr [name]. Oncology 14 days to discuss if further chemotherapy required   Refractory Nausea -Psychogenic vs side effect chemotherapy vs Adrenal insufficiency. Currently favor psychogenic (depression from his cancer/chemotherapy) -KUB; negative for ileus/SBO  -TSH WNL, obtain free T4/T3 - A.m. Cortisol; low 2.0 -ACTH pending -See adrenal insufficiency   Code Status: FULL Family Communication: Wife present at time of exam Disposition Plan: Discharge in A.m.    Consultants: Dr [name]. Oncology  Dr.Murali Ramaswamy PCCM    Procedure/Significant Events: 8/24 echocardiogram;- Left ventricle: moderate LVH. LVEDF= 65%-to 70%.  - (grade 1 diastolicdysfunction).  9/16 CTA chest > NEG PE, persistent bilat patchy asd, worse RUL 9/17 abd u/s > No cholelithiasis or sonographic evidence of acute cholecystitis. Mild increased renal cortical echogenicity as can be seen with medical renal disease versus acute  kidney injury. 9/17 LP 9/17 > 9/20 MRI brain;No acute  intracranial abnormality. - Extensive paranasal sinus mucosal disease, - Left >>Rt mastoid effusions 9/24 CT abdomen pelvis without contrast;-Kidneys are slightly prominent with mild retroperitoneal stranding. acute kidney injury? Negative for kidney stones or hydronephrosis. -Normal adrenal glands   Culture 9/16 urine coag negative  9/16 blood left AC 2 NGTD 9/17 CSF negative yeast/fungal 9/18 sputum negative    Antibiotics: Zosyn 9/16 > stopped 9/21 Vancomycin 9/16 > stopped 9/21 Levofloxacin 9/21>>  DVT prophylaxis: Lovenox   Devices    LINES / TUBES:      Continuous Infusions: . sodium chloride Stopped (03/13/15 1430)    Objective: VITAL SIGNS: Temp: 97.9 F (36.6 C) (09/25 0757) Temp Source: Oral (09/25 0757) BP: 114/73 mmHg (09/25 1615) Pulse Rate: 84 (09/25 1615) SPO2; FIO2:   Intake/Output Summary (Last 24 hours) at 03/15/15 1846 Last data filed at 03/15/15 1300  Gross per 24 hour  Intake    990 ml  Output   2200 ml  Net  -1210 ml     Exam: General:A/O 4, No acute respiratory distress Eyes: Negative headache, negative photophobia, negative double vision,negative scleral hemorrhage ENT: Negative Runny nose, negative ear pain, negative gingival bleeding, Neck:  Negative scars, masses, torticollis, lymphadenopathy, JVD Lungs: Clear to auscultation bilaterally without wheezes or crackles Cardiovascular: Regular rate and rhythm without murmur gallop or rub normal S1 and S2 Abdomen:negative abdominal pain, negative distended, positive soft, bowel sounds, no rebound, no ascites, no appreciable mass Extremities: No significant cyanosis, clubbing, or edema bilateral lower extremities Psychiatric:  Negative depression, negative anxiety, negative fatigue, negative mania  Neurologic:  Cranial nerves II through XII intact, tongue/uvula midline, all extremities muscle strength 5/5, sensation intact  throughout, negative dysarthria, negative expressive aphasia, negative receptive aphasia.   Data Reviewed: Basic Metabolic Panel:  Recent Labs Lab 03/11/15 0214 03/12/15 0314 03/12/15 1625 03/13/15 0438 03/14/15 0227 03/15/15 0518  NA 137 139 138 136 137 137  K 3.4* 3.4* 3.1* 3.4* 3.3* 3.8  CL 101 100* 100* 102 100* 102  CO2 28 29 27 26 27 26   GLUCOSE 89 77 99 83 94 89  BUN 16 15 14 14 12 9   CREATININE 2.77* 3.20* 3.26* 3.68* 3.71* 3.52*  CALCIUM 8.5* 8.5* 8.4* 8.1* 8.6* 8.7*  MG 1.6*  --  1.6* 1.7 1.9 1.7   Liver Function Tests:  Recent Labs Lab 03/10/15 0300 03/11/15 0214 03/13/15 0438 03/14/15 0227 03/15/15 0518  AST 12* 11* 11* 11* 13*  ALT 18 18 14* 13* 14*  ALKPHOS 48 48 43 46 73  BILITOT 0.7 0.9 0.5 0.5 0.6  PROT 6.0* 5.5* 5.4* 6.1* 6.2*  ALBUMIN 2.8* 2.6* 2.3* 2.5* 2.6*   No results for input(s): LIPASE, AMYLASE in the last 168 hours.  Recent Labs Lab 03/12/15 0314  AMMONIA 44*   CBC:  Recent Labs Lab 03/11/15 0214 03/12/15 0314 03/13/15 0438 03/14/15 0227 03/15/15 0518  WBC 7.3 7.3 6.8 6.2 6.2  NEUTROABS  --   --  3.9 3.2 3.5  HGB 8.3* 8.2* 7.7* 9.6* 9.9*  HCT 24.9* 25.1* 23.0* 28.8* 29.6*  MCV 91.9 91.9 93.1 91.1 91.4  PLT 183 205 203 248 255   Cardiac Enzymes: No results for input(s): CKTOTAL, CKMB, CKMBINDEX, TROPONINI in the last 168 hours. BNP (last 3 results)  Recent Labs  02/09/15 1118 02/11/15 0830  BNP 17.0 9.3    ProBNP (last 3 results) No results for input(s): PROBNP in the last 8760 hours.  CBG: No results for input(s): GLUCAP in the last 168  hours.  Recent Results (from the past 240 hour(s))  Urine culture     Status: None   Collection Time: 03/06/15  9:28 AM  Result Value Ref Range Status   Specimen Description URINE, CLEAN CATCH  Final   Special Requests NONE  Final   Culture   Final    30,000 COLONIES/mL STAPHYLOCOCCUS SPECIES (COAGULASE NEGATIVE)   Report Status 03/08/2015 FINAL  Final   Organism ID,  Bacteria STAPHYLOCOCCUS SPECIES (COAGULASE NEGATIVE)  Final      Susceptibility   Staphylococcus species (coagulase negative) - MIC*    CIPROFLOXACIN >=8 RESISTANT Resistant     GENTAMICIN <=0.5 SENSITIVE Sensitive     NITROFURANTOIN <=16 SENSITIVE Sensitive     OXACILLIN >=4 RESISTANT Resistant     TETRACYCLINE <=1 SENSITIVE Sensitive     VANCOMYCIN <=0.5 SENSITIVE Sensitive     TRIMETH/SULFA <=10 SENSITIVE Sensitive     CLINDAMYCIN <=0.25 SENSITIVE Sensitive     RIFAMPIN <=0.5 SENSITIVE Sensitive     Inducible Clindamycin NEGATIVE Sensitive     * 30,000 COLONIES/mL STAPHYLOCOCCUS SPECIES (COAGULASE NEGATIVE)  Culture, blood (routine x 2)     Status: None   Collection Time: 03/06/15  7:35 PM  Result Value Ref Range Status   Specimen Description BLOOD LEFT ANTECUBITAL  Final   Special Requests BOTTLES DRAWN AEROBIC AND ANAEROBIC 5CC   Final   Culture NO GROWTH 5 DAYS  Final   Report Status 03/11/2015 FINAL  Final  Culture, blood (routine x 2)     Status: None   Collection Time: 03/06/15  7:53 PM  Result Value Ref Range Status   Specimen Description BLOOD LEFT ANTECUBITAL  Final   Special Requests BOTTLES DRAWN AEROBIC ONLY 5CC  Final   Culture NO GROWTH 5 DAYS  Final   Report Status 03/11/2015 FINAL  Final  MRSA PCR Screening     Status: None   Collection Time: 03/06/15 10:33 PM  Result Value Ref Range Status   MRSA by PCR NEGATIVE NEGATIVE Final    Comment:        The GeneXpert MRSA Assay (FDA approved for NASAL specimens only), is one component of a comprehensive MRSA colonization surveillance program. It is not intended to diagnose MRSA infection nor to guide or monitor treatment for MRSA infections.   CSF culture     Status: None   Collection Time: 03/07/15  1:20 AM  Result Value Ref Range Status   Specimen Description CSF  Final   Special Requests Immunocompromised  Final   Gram Stain   Final    CYTOSPIN SMEAR WBC PRESENT, PREDOMINANTLY PMN NO ORGANISMS SEEN     Culture NO GROWTH 3 DAYS  Final   Report Status 03/10/2015 FINAL  Final  Fungus Culture with Smear     Status: None (Preliminary result)   Collection Time: 03/07/15  1:20 AM  Result Value Ref Range Status   Specimen Description CSF  Final   Special Requests NONE  Final   Fungal Smear   Final    NO YEAST OR FUNGAL ELEMENTS SEEN Performed at [facility]    Culture   Final    CULTURE IN PROGRESS FOR FOUR WEEKS Performed at [facility]    Report Status PENDING  Incomplete  Culture, expectorated sputum-assessment     Status: None   Collection Time: 03/08/15  8:10 PM  Result Value Ref Range Status   Specimen Description SPUTUM  Final   Special Requests NONE  Final  Sputum evaluation   Final    THIS SPECIMEN IS ACCEPTABLE. RESPIRATORY CULTURE REPORT TO FOLLOW.   Report Status 03/08/2015 FINAL  Final  Culture, respiratory (NON-Expectorated)     Status: None   Collection Time: 03/08/15  8:10 PM  Result Value Ref Range Status   Specimen Description SPUTUM  Final   Special Requests NONE  Final   Gram Stain   Final    MODERATE WBC PRESENT, PREDOMINANTLY PMN NO SQUAMOUS EPITHELIAL CELLS SEEN NO ORGANISMS SEEN Performed at [facility]    Culture   Final    NORMAL OROPHARYNGEAL FLORA Performed at [facility]    Report Status 03/11/2015 FINAL  Final     Studies:  Recent x-ray studies have been reviewed in detail by the Attending Physician  Scheduled Meds:  Scheduled Meds: . antiseptic oral rinse  7 mL Mouth Rinse BID  . aspirin EC  81 mg Oral Daily  . diltiazem  120 mg Oral Daily  . enoxaparin (LOVENOX) injection  40 mg Subcutaneous Q24H  . escitalopram  10 mg Oral Daily  . hydrocortisone sod succinate (SOLU-CORTEF) inj  100 mg Intravenous Q8H  . levofloxacin  750 mg Oral Q48H  . metoCLOPramide (REGLAN) injection  5 mg Intravenous TID AC  . pantoprazole  40 mg Oral QHS  . [START ON 03/16/2015] predniSONE  5 mg Oral Q breakfast  .  scopolamine  1 patch Transdermal Q72H    Time spent on care of this patient: 40 mins   WOODS, Geraldo Docker , MD  [facility] Office  [phone] Pager - [phone]  On-Call/Text Page:      [email or web address]      password [email or web address]  If 7PM-7AM, please contact night-coverage [email or web address] Password [facility] 03/15/2015, 6:46 PM   LOS: 9 days   Care during the described time interval was provided by me .  I have reviewed this patient's available data, including medical history, events of note, physical examination, and all test results as part of my evaluation. I have personally reviewed and interpreted all radiology studies.   Dia Crawford, MD [phone] Pager

## 2015-03-16 ENCOUNTER — Telehealth: Payer: Self-pay | Admitting: Hematology & Oncology

## 2015-03-16 DIAGNOSIS — J349 Unspecified disorder of nose and nasal sinuses: Secondary | ICD-10-CM

## 2015-03-16 DIAGNOSIS — N171 Acute kidney failure with acute cortical necrosis: Secondary | ICD-10-CM

## 2015-03-16 DIAGNOSIS — G4733 Obstructive sleep apnea (adult) (pediatric): Secondary | ICD-10-CM

## 2015-03-16 DIAGNOSIS — R112 Nausea with vomiting, unspecified: Secondary | ICD-10-CM

## 2015-03-16 DIAGNOSIS — E872 Acidosis: Secondary | ICD-10-CM

## 2015-03-16 DIAGNOSIS — E2740 Unspecified adrenocortical insufficiency: Secondary | ICD-10-CM

## 2015-03-16 DIAGNOSIS — R410 Disorientation, unspecified: Secondary | ICD-10-CM

## 2015-03-16 DIAGNOSIS — H7093 Unspecified mastoiditis, bilateral: Secondary | ICD-10-CM

## 2015-03-16 LAB — CBC WITH DIFFERENTIAL/PLATELET
BASOS ABS: 0 10*3/uL (ref 0.0–0.1)
BASOS PCT: 0 %
Eosinophils Absolute: 0 10*3/uL (ref 0.0–0.7)
Eosinophils Relative: 0 %
HEMATOCRIT: 29 % — AB (ref 39.0–52.0)
HEMOGLOBIN: 9.7 g/dL — AB (ref 13.0–17.0)
Lymphocytes Relative: 14 %
Lymphs Abs: 0.9 10*3/uL (ref 0.7–4.0)
MCH: 29.9 pg (ref 26.0–34.0)
MCHC: 33.4 g/dL (ref 30.0–36.0)
MCV: 89.5 fL (ref 78.0–100.0)
Monocytes Absolute: 0.1 10*3/uL (ref 0.1–1.0)
Monocytes Relative: 2 %
NEUTROS ABS: 5.2 10*3/uL (ref 1.7–7.7)
NEUTROS PCT: 84 %
Platelets: 311 10*3/uL (ref 150–400)
RBC: 3.24 MIL/uL — ABNORMAL LOW (ref 4.22–5.81)
RDW: 15.2 % (ref 11.5–15.5)
WBC: 6.2 10*3/uL (ref 4.0–10.5)

## 2015-03-16 LAB — COMPREHENSIVE METABOLIC PANEL
ALBUMIN: 2.9 g/dL — AB (ref 3.5–5.0)
ALK PHOS: 45 U/L (ref 38–126)
ALT: 16 U/L — ABNORMAL LOW (ref 17–63)
AST: 14 U/L — AB (ref 15–41)
Anion gap: 10 (ref 5–15)
BILIRUBIN TOTAL: 0.5 mg/dL (ref 0.3–1.2)
BUN: 11 mg/dL (ref 6–20)
CO2: 24 mmol/L (ref 22–32)
Calcium: 9.1 mg/dL (ref 8.9–10.3)
Chloride: 103 mmol/L (ref 101–111)
Creatinine, Ser: 3.25 mg/dL — ABNORMAL HIGH (ref 0.61–1.24)
GFR calc Af Amer: 24 mL/min — ABNORMAL LOW (ref >60)
GFR calc non Af Amer: 21 mL/min — ABNORMAL LOW (ref >60)
GLUCOSE: 129 mg/dL — AB (ref 65–99)
POTASSIUM: 4.5 mmol/L (ref 3.5–5.1)
SODIUM: 137 mmol/L (ref 135–145)
TOTAL PROTEIN: 6.6 g/dL (ref 6.5–8.1)

## 2015-03-16 LAB — MAGNESIUM: Magnesium: 1.9 mg/dL (ref 1.7–2.4)

## 2015-03-16 LAB — T3, FREE: T3 FREE: 2.9 pg/mL (ref 2.0–4.4)

## 2015-03-16 LAB — ACTH: C206 ACTH: 2.2 pg/mL — AB (ref 7.2–63.3)

## 2015-03-16 MED ORDER — PREDNISONE 5 MG PO TABS
5.0000 mg | ORAL_TABLET | Freq: Every day | ORAL | Status: DC
  Administered 2015-03-16: 5 mg via ORAL

## 2015-03-16 MED ORDER — PANTOPRAZOLE SODIUM 40 MG PO TBEC: 40.0000 mg | DELAYED_RELEASE_TABLET | Freq: Every day | ORAL | Status: DC

## 2015-03-16 MED ORDER — METOCLOPRAMIDE HCL 5 MG PO TABS: 5.0000 mg | ORAL_TABLET | Freq: Four times a day (QID) | ORAL | Status: DC | PRN

## 2015-03-16 MED ORDER — SCOPOLAMINE 1 MG/3DAYS TD PT72: 1.0000 | MEDICATED_PATCH | TRANSDERMAL | Status: DC

## 2015-03-16 MED ORDER — PREDNISONE 5 MG PO TABS
5.0000 mg | ORAL_TABLET | Freq: Two times a day (BID) | ORAL | Status: DC
  Filled 2015-03-16: qty 1

## 2015-03-16 MED ORDER — PREDNISONE 10 MG PO TABS: 5.0000 mg | ORAL_TABLET | Freq: Every day | ORAL | Status: DC

## 2015-03-16 MED ORDER — LEVOFLOXACIN 750 MG PO TABS: 750.0000 mg | ORAL_TABLET | ORAL | Status: AC

## 2015-03-16 NOTE — Progress Notes (Signed)
Physical Therapy Treatment Patient Details Name: James Byrd MRN: [id number] DOB: 1965-08-12 Today's Date: 03/16/2015    History of Present Illness Dhiren Carchidi is a 49 y.o. male With h/o SVT , hyperlipidemia, right great toe melanoma , s/p amputation on chemotherapy, diastolic heart failure, hyperlipidemia, thrombocytopenia, recently discharged after being treated for pneumonia on 8/29, presents again today for worsening sob, productive cough and nausea, vomiting. He reports [facility], he [facility] at [facility] for similar complaints, admitted there and was given antibiotics and discharged on 2 lit [place] oxygen and po antibiotics. ona rrival to ED, he was found to be tachypnic, tachycardic, hypoxic on 2 lit [place] oxygen. CT chest revealed persistent bilateral air space disease partially improved when compared to previous exam but worse in the posterior right upper lobe.    PT Comments    Pt notes that he is feeling good today, has a better appetite, feels his LE strength is getting better, and he is ready to get home.  Pt was able to ambulate without assistive device with PT supervision on room air, O2 sats remaining stable (seated on room air 95%, seated after walking 125' 90%, while walking 91%).  Pt continues to have unsteadiness with gait though he did not experience any LOB. Pt wife who is supportive and available to help as needed upon discharge home was present during session. Further acute PT is necessary to ensure safety with functional mobility and to improve activity tolerance.   Follow Up Recommendations  Supervision for mobility/OOB     Equipment Recommendations  Rolling walker with 5" wheels    Recommendations for Other Services       Precautions / Restrictions Precautions Precautions: Fall Restrictions Weight Bearing Restrictions: No    Mobility  Bed Mobility Overal bed mobility: Modified Independent                 Transfers Overall transfer level: Needs assistance Equipment used: None Transfers: Sit to/from Stand Sit to Stand: Independent            Ambulation/Gait Ambulation/Gait assistance: Supervision (supervision due to unsteadiness) Ambulation Distance (Feet): 125 Feet (125' + 50') Assistive device: None Gait Pattern/deviations: Step-through pattern (L leaning, waddling gait, decreased bilateral arm swing) Gait velocity: moderate speed   General Gait Details: on room air O2 sats did not drop below 90% with ambulation   Stairs Stairs: Yes Stairs assistance: Min guard Stair Management: No rails Number of Stairs: 2 (2stairs x2) General stair comments: step to pattern  Wheelchair Mobility    Modified Rankin (Stroke Patients Only)       Balance Overall balance assessment: Needs assistance Sitting-balance support: Feet supported;No upper extremity supported Sitting balance-Leahy Scale: Good     Standing balance support: No upper extremity supported;During functional activity Standing balance-Leahy Scale: Fair                 High Level Balance Comments: pt demonstrating some balance deficits with ambulation with tendency to lean L and waddling type gait    Cognition Arousal/Alertness: Awake/alert Behavior During Therapy: WFL for tasks assessed/performed;Impulsive Overall Cognitive Status: Within Functional Limits for tasks assessed                      Exercises      General Comments        Pertinent Vitals/Pain Pain Assessment: No/denies pain    Home Living  Prior Function            PT Goals (current goals can now be found in the care plan section) Acute Rehab PT Goals Patient Stated Goal: to return to his "normal", to sit on his back deck PT Goal Formulation: With patient Time For Goal Achievement: 03/03/15 Potential to Achieve Goals: Good Progress towards PT goals: Progressing toward goals     Frequency  Min 3X/week    PT Plan Current plan remains appropriate    Co-evaluation             End of Session Equipment Utilized During Treatment: Oxygen (started with 2L nasal cannula, progressed to room air) Activity Tolerance: Patient tolerated treatment well Patient left: in chair;with nursing/sitter in room     Time: [id number] PT Time Calculation (min) (ACUTE ONLY): 26 min  Charges:  $Gait Training: 23-37 mins                    G CodesLorita Officer 03/28/15, 12:31 PM  Lorita Officer, SPT  Cassell Clement, [place], [place] Pager [phone] Office [phone]

## 2015-03-16 NOTE — Progress Notes (Signed)
   03/16/15 1401  Clinical Encounter Type  Visited With Patient and family together;Health care provider  Visit Type Initial;Spiritual support  Referral From Nurse   Chaplain met with patient and wife, facilitated life review, and shared in spiritual dialogue/assessment. Chaplain support available as needed.   Jeri Lager, Chaplain 03/16/2015 2:02 PM

## 2015-03-16 NOTE — Care Management Note (Signed)
Case Management Note  Patient Details  Name: James Byrd MRN: [id number] Date of Birth: 02-21-1966  Subjective/Objective:              Admitted with acute hypoxic respiratory failure      Action/Plan: Initially PT recommended HHPT, due to patient's improvement PT no longer recommending HHPT. Spoke with patient and his wife about discharge, will need rolling walker and tub bench, patient already on home O2. Contacted Jermaine with [employer] and requested rolling walker and tub bench be delivered to patient's room. Patient stated that his wife is able to assist after discharge.    Expected Discharge Date:                  Expected Discharge Plan:  [place]  In-House Referral:     Discharge planning Services  CM Consult  Post Acute Care Choice:  Durable Medical Equipment, Home Health Choice offered to:  Patient, Spouse ([place] ? FOR DME)  DME Arranged:  Walker rolling, Tub bench DME Agency:  [place]:   [place] Agency:    Status of Service:  Completed, signed off  Medicare Important Message Given:    Date Medicare IM Given:    Medicare IM give by:    Date Additional Medicare IM Given:    Additional Medicare Important Message give by:     If discussed at [place] of Stay Meetings, dates discussed:    Additional Comments:  Nila Nephew, RN 03/16/2015, 2:00 PM

## 2015-03-16 NOTE — Telephone Encounter (Signed)
Patient's wife called to sch follow up appt for patient.  She sch apt for 04/03/15

## 2015-03-16 NOTE — Discharge Summary (Signed)
DISCHARGE SUMMARY  James Byrd  MR#: [id number]  DOB:Jan 24, 1966  Date of Admission: 03/06/2015 Date of Discharge: 03/16/2015  Attending Physician:MCCLUNG,JEFFREY T  Patient's DM:[id number], Herbie Baltimore, MD  Consults: Oncology  PCCM   Disposition: D/C home   Follow-up Appts:     Follow-up Information    Follow up with [facility] On 04/23/2015.   Specialty:  Sleep Medicine   Why:   [place] [facility])  appointment scheduled for 04/23/15 at [place] information:   [place], [place] [id number] [place] [phone]      Follow up with [name], MD. Schedule an appointment as soon as possible for a visit in 2 weeks.   Specialty:  Oncology   Why:  Follow-up with Dr [name]. Oncology 14 days to discuss if further chemotherapy required    Contact information:   [place], [address] [place] [address] [phone]       Follow up with Ann Held, MD.   Specialty:  Family Medicine   Why:  Schedule appointment with Dr. Ann Held recheck A.m. cortisol and ACTH in 3-4 weeks, adrenal insufficiency   Contact information:   [place] [address] [phone]      Tests Needing Follow-up: -monitoring or renal function is suggested, w/ BMET check in 7 days  -monitoring of Hgb - may require epo if Hgb trends down and renal fxn does not improve   Discharge Diagnoses: Acute hypoxic respiratory failure  Sepsis - ?due to severe acute sinusitis  ?HCAP +/- pneumonitis AMS Refractory nausea Tachycardia Acute lactic acidosis Acute renal failure Hx of Melanoma - completed 3 cycles chemo  Acute lekocytosis Hx OSA  UTI (?contaminant, asymptomatic)   Initial presentation: 49 y/o male with hx melanoma on current chemo and ?pulmonary toxicity recently started on home O2. Presented 9/16 with fevers and admitted with ?HCAP and sepsis. On 9/17  rapid response called r/t worsening respiratory failure and AMS.   Hospital Course:  Acute hypoxic respiratory failure  resolved   Sepsis due to severe acute sinusitis  unclear etiology - doubt from UTI - LP done given AMS and immunocompromised but findings unremarkable - MRI brain 9/20 suggested extensive paranasal sinus disease - continue antibiotics to complete a 3 week course   ?HCAP +/- pneumonitis clinical picture and cause of his infiltrates and fever unclear - treated for both PNA and inflammatory pneumonitis per PCCM  Acute renal failure  Felt to be r/t hypotension and contrast +/- chemo - Creatinine was slowly improving, but has slowed around 3.5 - prior crt in August 0.8 - will need to be followed as outpt - avoid potential nephrotoxins  AMS MRI brain 9/20 w/o acute brain abnormality noted, but suggested extensive paranasal sinus disease - 0000000 and folic acid levels normal - mental status has essentially recovered at time of d/c   Adrenal insufficiency? -Patient's a.m. cortisol low at 2.0, but was taken at 2:30 AM - ACTH pending at time of d/c  -MRI head no discussion of pituitary abnormality -CT abdomen/pelvis; normal adrenal glands  -9/24 started Solu-Cortef 100 mg TID - tapered prior to d/c  -Discharge on prednisone 5 mg daily - PCP to recheck AM cortisol in 3-4 weeks  Refractory nausea The patient denies abdominal pain - he is moving his bowels and his abdominal exam is unrevealing  -KUB negative for ileus/SBO  -TSH WNL  Chronic diastolic CHF well  compensated at time of d/c   Tachycardia resolved   Acute lactic acidosis Resolved   Hx of Melanoma - completed 3 cycles chemo  -was seen by Oncology during admission  -Follow-up with Dr [name]. Oncology 14 days to discuss if further chemotherapy required   Acute lekocytosis Normalized prior to d/c home   OSA  -CPAP per respiratory while inpt -Patient scheduled for [facility]  ( [place]) appointment scheduled for 04/23/15 at 8PM  UTI (?contaminant, asymptomatic)  Given low colony count doubt coag negative staph identified     Medication List    STOP taking these medications        NON FORMULARY     pravastatin 40 MG tablet  Commonly known as:  PRAVACHOL      TAKE these medications        acetaminophen 500 MG tablet  Commonly known as:  TYLENOL  Take 500 mg by mouth every 6 (six) hours as needed for mild pain.     aspirin 81 MG tablet  Take 81 mg by mouth daily.     diltiazem 120 MG 24 hr capsule  Commonly known as:  TIAZAC  Take 120 mg by mouth daily.     escitalopram 10 MG tablet  Commonly known as:  LEXAPRO  Take 1 tablet (10 mg total) by mouth daily.     levofloxacin 750 MG tablet  Commonly known as:  LEVAQUIN  Take 1 tablet (750 mg total) by mouth every other day.     MENS ONE DAILY PO  Take 1 tablet by mouth daily.     metoCLOPramide 5 MG tablet  Commonly known as:  REGLAN  Take 1 tablet (5 mg total) by mouth every 6 (six) hours as needed for refractory nausea / vomiting.     pantoprazole 40 MG tablet  Commonly known as:  PROTONIX  Take 1 tablet (40 mg total) by mouth at bedtime.     predniSONE 10 MG tablet  Commonly known as:  DELTASONE  Take 0.5 tablets (5 mg total) by mouth daily with breakfast.     scopolamine 1 MG/3DAYS  Commonly known as:  TRANSDERM-SCOP  Place 1 patch (1.5 mg total) onto the skin every 3 (three) days.        Day of Discharge BP 128/90 mmHg  Pulse 95  Temp(Src) 98.3 F (36.8 C) (Oral)  Resp 16  Ht 6' (1.829 m)  Wt 115.35 kg (254 lb 4.8 oz)  BMI 34.48 kg/m2  SpO2 92%  Physical Exam: General: No acute respiratory distress Lungs: Clear to auscultation bilaterally without wheezes or crackles Cardiovascular: Regular rate and rhythm without murmur gallop or rub normal S1 and S2 Abdomen: Nontender, nondistended, soft, bowel sounds positive, no rebound, no ascites, no appreciable  mass Extremities: No significant cyanosis, clubbing, or edema bilateral lower extremities  Basic Metabolic Panel:  Recent Labs Lab 03/12/15 1625 03/13/15 0438 03/14/15 0227 03/15/15 0518 03/16/15 0435  NA 138 136 137 137 137  K 3.1* 3.4* 3.3* 3.8 4.5  CL 100* 102 100* 102 103  CO2 27 26 27 26 24   GLUCOSE 99 83 94 89 129*  BUN 14 14 12 9 11   CREATININE 3.26* 3.68* 3.71* 3.52* 3.25*  CALCIUM 8.4* 8.1* 8.6* 8.7* 9.1  MG 1.6* 1.7 1.9 1.7 1.9    Liver Function Tests:  Recent Labs Lab 03/11/15 0214 03/13/15 0438 03/14/15 0227 03/15/15 0518 03/16/15 0435  AST 11* 11* 11* 13* 14*  ALT  18 14* 13* 14* 16*  ALKPHOS 48 43 46 73 45  BILITOT 0.9 0.5 0.5 0.6 0.5  PROT 5.5* 5.4* 6.1* 6.2* 6.6  ALBUMIN 2.6* 2.3* 2.5* 2.6* 2.9*    Recent Labs Lab 03/12/15 0314  AMMONIA 44*   CBC:  Recent Labs Lab 03/12/15 0314 03/13/15 0438 03/14/15 0227 03/15/15 0518 03/16/15 0435  WBC 7.3 6.8 6.2 6.2 6.2  NEUTROABS  --  3.9 3.2 3.5 5.2  HGB 8.2* 7.7* 9.6* 9.9* 9.7*  HCT 25.1* 23.0* 28.8* 29.6* 29.0*  MCV 91.9 93.1 91.1 91.4 89.5  PLT 205 203 248 255 311    Recent Results (from the past 240 hour(s))  Culture, blood (routine x 2)     Status: None   Collection Time: 03/06/15  7:35 PM  Result Value Ref Range Status   Specimen Description BLOOD LEFT ANTECUBITAL  Final   Special Requests BOTTLES DRAWN AEROBIC AND ANAEROBIC 5CC   Final   Culture NO GROWTH 5 DAYS  Final   Report Status 03/11/2015 FINAL  Final  Culture, blood (routine x 2)     Status: None   Collection Time: 03/06/15  7:53 PM  Result Value Ref Range Status   Specimen Description BLOOD LEFT ANTECUBITAL  Final   Special Requests BOTTLES DRAWN AEROBIC ONLY 5CC  Final   Culture NO GROWTH 5 DAYS  Final   Report Status 03/11/2015 FINAL  Final  MRSA PCR Screening     Status: None   Collection Time: 03/06/15 10:33 PM  Result Value Ref Range Status   MRSA by PCR NEGATIVE NEGATIVE Final    Comment:        The GeneXpert  MRSA Assay (FDA approved for NASAL specimens only), is one component of a comprehensive MRSA colonization surveillance program. It is not intended to diagnose MRSA infection nor to guide or monitor treatment for MRSA infections.   CSF culture     Status: None   Collection Time: 03/07/15  1:20 AM  Result Value Ref Range Status   Specimen Description CSF  Final   Special Requests Immunocompromised  Final   Gram Stain   Final    CYTOSPIN SMEAR WBC PRESENT, PREDOMINANTLY PMN NO ORGANISMS SEEN    Culture NO GROWTH 3 DAYS  Final   Report Status 03/10/2015 FINAL  Final  Fungus Culture with Smear     Status: None (Preliminary result)   Collection Time: 03/07/15  1:20 AM  Result Value Ref Range Status   Specimen Description CSF  Final   Special Requests NONE  Final   Fungal Smear   Final    NO YEAST OR FUNGAL ELEMENTS SEEN Performed at [facility]    Culture   Final    CULTURE IN PROGRESS FOR FOUR WEEKS Performed at [facility]    Report Status PENDING  Incomplete  Culture, expectorated sputum-assessment     Status: None   Collection Time: 03/08/15  8:10 PM  Result Value Ref Range Status   Specimen Description SPUTUM  Final   Special Requests NONE  Final   Sputum evaluation   Final    THIS SPECIMEN IS ACCEPTABLE. RESPIRATORY CULTURE REPORT TO FOLLOW.   Report Status 03/08/2015 FINAL  Final  Culture, respiratory (NON-Expectorated)     Status: None   Collection Time: 03/08/15  8:10 PM  Result Value Ref Range Status   Specimen Description SPUTUM  Final   Special Requests NONE  Final   Gram Stain   Final  MODERATE WBC PRESENT, PREDOMINANTLY PMN NO SQUAMOUS EPITHELIAL CELLS SEEN NO ORGANISMS SEEN Performed at [facility]    Culture   Final    NORMAL OROPHARYNGEAL FLORA Performed at [facility]    Report Status 03/11/2015 FINAL  Final     Time spent in discharge (includes decision making & examination of pt): >35  minutes  03/16/2015, 1:46 PM   Cherene Altes, MD [facility] Office  [phone] Pager [phone]  On-Call/Text Page:      [email or web address]      password [facility]

## 2015-03-17 LAB — H.PYLORI ANTIGEN, STOOL

## 2015-03-24 ENCOUNTER — Telehealth: Payer: Self-pay | Admitting: *Deleted

## 2015-03-24 NOTE — Telephone Encounter (Signed)
Patient's wife stating that patient is continuously vomiting. He is not keeping down medicines or food. He doesn't like taking the zofran because it causes headaches. Spoke to Dr Marin Olp. The patient hasn't been treated in the office sine early August. He has had several hospitalizations since this time. Dr Marin Olp doesn't believe these symptoms are related to any of the treatment he received for his cancer. Spoke to his wife who believes that patient needs to see an GI specialist. Patient just saw his PCP yesterday but she didn't request a referral at that time. Advised the patient's wife to call his PCP and ask for a referral to the GI specialist she would like patient to see. She will call the patient's PCP to see if she can get a referral.  Advised her that if patient continued to not keep anything down, he needed to go back to the emergency room for evaluation. She understood.

## 2015-03-31 ENCOUNTER — Telehealth: Payer: Self-pay | Admitting: *Deleted

## 2015-03-31 NOTE — Telephone Encounter (Signed)
Patient states his finger tips are very white. He states his daughter told him that he probably needs a blood transfusion. Patient denies any other symptoms including weakness, fatigue and shortness of breath. He currently has an appointment on Friday with labs. Informed patient that his blood counts will be assessed Friday and that if he is anemic, Dr Marin Olp can decide on treatment then. Patient was agreeable to plan.

## 2015-04-03 ENCOUNTER — Encounter: Payer: Self-pay | Admitting: Hematology & Oncology

## 2015-04-03 ENCOUNTER — Other Ambulatory Visit (HOSPITAL_BASED_OUTPATIENT_CLINIC_OR_DEPARTMENT_OTHER): Payer: BLUE CROSS/BLUE SHIELD

## 2015-04-03 ENCOUNTER — Ambulatory Visit (HOSPITAL_BASED_OUTPATIENT_CLINIC_OR_DEPARTMENT_OTHER): Payer: BLUE CROSS/BLUE SHIELD | Admitting: Hematology & Oncology

## 2015-04-03 VITALS — BP 122/77 | HR 90 | Temp 98.2°F | Resp 14 | Ht 72.0 in | Wt 250.0 lb

## 2015-04-03 DIAGNOSIS — F329 Major depressive disorder, single episode, unspecified: Secondary | ICD-10-CM

## 2015-04-03 DIAGNOSIS — R5383 Other fatigue: Secondary | ICD-10-CM

## 2015-04-03 DIAGNOSIS — C4371 Malignant melanoma of right lower limb, including hip: Secondary | ICD-10-CM

## 2015-04-03 DIAGNOSIS — H109 Unspecified conjunctivitis: Secondary | ICD-10-CM

## 2015-04-03 DIAGNOSIS — F32A Depression, unspecified: Secondary | ICD-10-CM

## 2015-04-03 DIAGNOSIS — D500 Iron deficiency anemia secondary to blood loss (chronic): Secondary | ICD-10-CM

## 2015-04-03 LAB — COMPREHENSIVE METABOLIC PANEL (CC13)
ALBUMIN: 3.9 g/dL (ref 3.5–5.0)
ALK PHOS: 61 U/L (ref 40–150)
ALT: 10 U/L (ref 0–55)
ANION GAP: 9 meq/L (ref 3–11)
AST: 13 U/L (ref 5–34)
BILIRUBIN TOTAL: 0.42 mg/dL (ref 0.20–1.20)
BUN: 16.8 mg/dL (ref 7.0–26.0)
CALCIUM: 10.2 mg/dL (ref 8.4–10.4)
CO2: 24 mEq/L (ref 22–29)
CREATININE: 1.4 mg/dL — AB (ref 0.7–1.3)
Chloride: 107 mEq/L (ref 98–109)
EGFR: 58 mL/min/{1.73_m2} — ABNORMAL LOW (ref >90)
Glucose: 104 mg/dl (ref 70–140)
Potassium: 4.2 mEq/L (ref 3.5–5.1)
Sodium: 140 mEq/L (ref 136–145)
TOTAL PROTEIN: 7.4 g/dL (ref 6.4–8.3)

## 2015-04-03 LAB — CBC WITH DIFFERENTIAL (CANCER CENTER ONLY)
BASO#: 0 10*3/uL (ref 0.0–0.2)
BASO%: 0.4 % (ref 0.0–2.0)
EOS%: 0.9 % (ref 0.0–7.0)
Eosinophils Absolute: 0.1 10*3/uL (ref 0.0–0.5)
HCT: 31.7 % — ABNORMAL LOW (ref 38.7–49.9)
HEMOGLOBIN: 10.4 g/dL — AB (ref 13.0–17.1)
LYMPH#: 1.3 10*3/uL (ref 0.9–3.3)
LYMPH%: 12.8 % — AB (ref 14.0–48.0)
MCH: 30.1 pg (ref 28.0–33.4)
MCHC: 32.8 g/dL (ref 32.0–35.9)
MCV: 92 fL (ref 82–98)
MONO#: 0.6 10*3/uL (ref 0.1–0.9)
MONO%: 5.9 % (ref 0.0–13.0)
NEUT#: 7.8 10*3/uL — ABNORMAL HIGH (ref 1.5–6.5)
NEUT%: 80 % (ref 40.0–80.0)
Platelets: 228 10*3/uL (ref 145–400)
RBC: 3.45 10*6/uL — AB (ref 4.20–5.70)
RDW: 14.6 % (ref 11.1–15.7)
WBC: 9.8 10*3/uL (ref 4.0–10.0)

## 2015-04-03 LAB — FUNGUS CULTURE W SMEAR: Fungal Smear: NONE SEEN

## 2015-04-03 LAB — LACTATE DEHYDROGENASE: LDH: 167 U/L (ref 94–250)

## 2015-04-06 LAB — IRON AND TIBC CHCC
%SAT: 25 % (ref 20–55)
Iron: 58 ug/dL (ref 42–163)
TIBC: 238 ug/dL (ref 202–409)
UIBC: 179 ug/dL (ref 117–376)

## 2015-04-06 LAB — TSH CHCC: TSH: 0.957 m[IU]/L (ref 0.320–4.118)

## 2015-04-06 LAB — FERRITIN CHCC: FERRITIN: 766 ng/mL — AB (ref 22–316)

## 2015-04-06 NOTE — Progress Notes (Signed)
Hematology and Oncology Follow Up Visit  James Byrd [id number] 06-Jun-1966 49 y.o. 04/06/2015   Principle Diagnosis:   Stage IIIB [id number]) subungual melanoma of the right hallux  Current Therapy:    Yervoy-status post 3 cycles. Discontinued secondary to toxicity.     Interim History:  James Byrd is back for follow-up. He really has had a tough time. He's been hospitalized. He finally starting to feel a little better.  When he was last hospitalized, he required a lumbar puncture. Thankfully, there was no evidence of melanoma.  He is still weak. He still is wearing supplemental oxygen. We checked his oxygen saturation today and it was 98%.  There is not been any rashes. He's had no diarrhea. He's had no cough. He's had no fever.  He is not yet back to work. He just is not able to go back to work yet. He has very little stamina. We will have to try to get him set up with physical therapy.  His appetite is starting to improve little bit. He really lost his taste for food. He has had no problems with nausea or vomiting.  Currently, his performance status is ECOG 2-3.   Medications:  Current outpatient prescriptions:  .  acetaminophen (TYLENOL) 500 MG tablet, Take 500 mg by mouth every 6 (six) hours as needed for mild pain., Disp: , Rfl:  .  aspirin 81 MG tablet, Take 81 mg by mouth daily., Disp: , Rfl:  .  diltiazem (TIAZAC) 120 MG 24 hr capsule, Take 120 mg by mouth daily., Disp: , Rfl:  .  escitalopram (LEXAPRO) 10 MG tablet, Take 1 tablet (10 mg total) by mouth daily., Disp: 90 tablet, Rfl: 90 .  meclizine (ANTIVERT) 25 MG tablet, Take 25 mg by mouth 3 (three) times daily as needed for dizziness., Disp: , Rfl:  .  metoCLOPramide (REGLAN) 5 MG tablet, Take 1 tablet (5 mg total) by mouth every 6 (six) hours as needed for refractory nausea / vomiting., Disp: 20 tablet, Rfl: 0 .  Multiple Vitamins-Minerals (MENS ONE DAILY PO), Take 1 tablet by mouth daily. , Disp: , Rfl:  .   ondansetron (ZOFRAN) 8 MG tablet, Take 1 mg by mouth every 8 (eight) hours as needed for nausea or vomiting., Disp: , Rfl:  .  pantoprazole (PROTONIX) 40 MG tablet, Take 1 tablet (40 mg total) by mouth at bedtime., Disp: 30 tablet, Rfl: 0 .  predniSONE (DELTASONE) 10 MG tablet, Take 0.5 tablets (5 mg total) by mouth daily with breakfast., Disp: 30 tablet, Rfl: 0 .  promethazine (PHENERGAN) 25 MG suppository, Place 25 mg rectally every 6 (six) hours as needed for nausea or vomiting., Disp: , Rfl:  No current facility-administered medications for this visit.  Facility-Administered Medications Ordered in Other Visits:  .  0.9 %  sodium chloride infusion, , Intravenous, Continuous, [name], MD, Last Rate: 500 mL/hr at 02/05/15 1510  Allergies: No Known Allergies  Past Medical History, Surgical history, Social history, and Family History were reviewed and updated.  Review of Systems: As above  Physical Exam:  height is 6' (1.829 m) and weight is 250 lb (113.399 kg). His oral temperature is 98.2 F (36.8 C). His blood pressure is 122/77 and his pulse is 90. His respiration is 14.   Wt Readings from Last 3 Encounters:  04/03/15 250 lb (113.399 kg)  03/16/15 254 lb 4.8 oz (115.35 kg)  02/16/15 255 lb 3.2 oz (115.758 kg)     Well developed and  well nourished white [name]. Head and neck exam shows no ocular or oral lesions. There might be some slight injectable inflammation, more so on the left eye. He has no adenopathy in the neck. Lungs are clear. He has no rales, wheezes or rhonchi. Cardiac exam regular rate and rhythm with no murmurs, rubs or bruits. Abdomen is soft. He has good bowel sounds. There is no fluid wave. There is no palpable liver or spleen tip. Back exam shows no tenderness over the spine, ribs or hips. Extremities shows the amputated big toe of the right foot. There is no swelling in the right leg. Skin exam shows no rashes or ecchymosis or petechia. Neurological exam is  nonfocal.  Lab Results  Component Value Date   WBC 9.8 04/03/2015   HGB 10.4* 04/03/2015   HCT 31.7* 04/03/2015   MCV 92 04/03/2015   PLT 228 04/03/2015     Chemistry      Component Value Date/Time   NA 140 04/03/2015 1359   NA 137 03/16/2015 0435   NA 136 02/05/2015 1339   K 4.2 04/03/2015 1359   K 4.5 03/16/2015 0435   K 3.7 02/05/2015 1339   CL 103 03/16/2015 0435   CL 101 02/05/2015 1339   CO2 24 04/03/2015 1359   CO2 24 03/16/2015 0435   CO2 25 02/05/2015 1339   BUN 16.8 04/03/2015 1359   BUN 11 03/16/2015 0435   BUN 20 02/05/2015 1339   CREATININE 1.4* 04/03/2015 1359   CREATININE 3.25* 03/16/2015 0435   CREATININE 0.8 02/05/2015 1339      Component Value Date/Time   CALCIUM 10.2 04/03/2015 1359   CALCIUM 9.1 03/16/2015 0435   CALCIUM 8.8 02/05/2015 1339   ALKPHOS 61 04/03/2015 1359   ALKPHOS 45 03/16/2015 0435   ALKPHOS 37 02/05/2015 1339   AST 13 04/03/2015 1359   AST 14* 03/16/2015 0435   AST 21 02/05/2015 1339   ALT 10 04/03/2015 1359   ALT 16* 03/16/2015 0435   ALT 45 02/05/2015 1339   BILITOT 0.42 04/03/2015 1359   BILITOT 0.5 03/16/2015 0435   BILITOT 0.80 02/05/2015 1339         Impression and Plan: James Byrd is 49 year old gentleman with a stage IIIB melanoma of the right hallux that was subungual. He had a microscopic positive inguinal lymph node.  I must say that he has had some issues with therapy. It's hard to say how much is totally associated with the [facility].  He only got 3 doses. He just was not going to tolerate a final dose. He will he gave him a dose reduction, he had a very difficult time.  I will like to think that the chance of his melanoma coming back will be low.  I'll set him up with scans.  He certainly cannot go back to work. I probably would have him out of work for the rest of the year.  I will plan to see him back in about 6 weeks.  I spent about 40 minutes with he and his wife.   [name],  MD 10/17/20167:50 AM

## 2015-04-08 ENCOUNTER — Other Ambulatory Visit: Payer: Self-pay | Admitting: *Deleted

## 2015-04-08 ENCOUNTER — Telehealth: Payer: Self-pay | Admitting: *Deleted

## 2015-04-08 DIAGNOSIS — C4371 Malignant melanoma of right lower limb, including hip: Secondary | ICD-10-CM

## 2015-04-08 NOTE — Telephone Encounter (Signed)
Referral Order sent to [facility] records and all other requested information faxed to [phone]

## 2015-04-14 ENCOUNTER — Telehealth: Payer: Self-pay | Admitting: Hematology & Oncology

## 2015-04-14 NOTE — Telephone Encounter (Signed)
Called patient's cell phone and left a message stating patient's appt with [facility] on Nov. 1st, 2016 at 2:00pm. [facility] Phn # [phone], Fax # [phone].       [facility].

## 2015-04-17 ENCOUNTER — Ambulatory Visit (HOSPITAL_BASED_OUTPATIENT_CLINIC_OR_DEPARTMENT_OTHER)
Admission: RE | Admit: 2015-04-17 | Discharge: 2015-04-17 | Disposition: A | Discharge status: Home / Self Care | Payer: BLUE CROSS/BLUE SHIELD | Source: Ambulatory Visit | Attending: Hematology & Oncology | Admitting: Hematology & Oncology

## 2015-04-17 DIAGNOSIS — K7689 Other specified diseases of liver: Secondary | ICD-10-CM | POA: Insufficient documentation

## 2015-04-17 DIAGNOSIS — C4371 Malignant melanoma of right lower limb, including hip: Secondary | ICD-10-CM

## 2015-04-17 MED ORDER — IOHEXOL 300 MG/ML  SOLN
100.0000 mL | Freq: Once | INTRAMUSCULAR | Status: AC | PRN
  Administered 2015-04-17: 100 mL via INTRAVENOUS

## 2015-04-20 ENCOUNTER — Telehealth: Payer: Self-pay | Admitting: Hematology & Oncology

## 2015-04-20 ENCOUNTER — Telehealth: Payer: Self-pay | Admitting: Nurse Practitioner

## 2015-04-20 NOTE — Telephone Encounter (Signed)
Called patient's cell phone and L/m requesting patient to call back. Need to move patient's appt to another time/date if possible.       [facility].

## 2015-04-20 NOTE — Telephone Encounter (Addendum)
  LVM on pt's personal machine. ----- Message from [name], MD sent at 04/20/2015  6:58 AM EDT ----- Call - NO evidence of melanoma!!!!  Laurey Arrow

## 2015-04-21 ENCOUNTER — Ambulatory Visit: Payer: BLUE CROSS/BLUE SHIELD | Admitting: Physical Therapy

## 2015-04-23 ENCOUNTER — Ambulatory Visit (HOSPITAL_BASED_OUTPATIENT_CLINIC_OR_DEPARTMENT_OTHER): Payer: BLUE CROSS/BLUE SHIELD | Attending: Internal Medicine | Admitting: Radiology

## 2015-04-23 VITALS — Ht 72.0 in | Wt 245.0 lb

## 2015-04-23 DIAGNOSIS — Z6833 Body mass index (BMI) 33.0-33.9, adult: Secondary | ICD-10-CM | POA: Diagnosis not present

## 2015-04-23 DIAGNOSIS — R0683 Snoring: Secondary | ICD-10-CM | POA: Insufficient documentation

## 2015-04-23 DIAGNOSIS — G4733 Obstructive sleep apnea (adult) (pediatric): Secondary | ICD-10-CM | POA: Diagnosis not present

## 2015-04-23 DIAGNOSIS — R5383 Other fatigue: Secondary | ICD-10-CM | POA: Diagnosis not present

## 2015-04-23 DIAGNOSIS — G4730 Sleep apnea, unspecified: Secondary | ICD-10-CM

## 2015-04-23 DIAGNOSIS — E669 Obesity, unspecified: Secondary | ICD-10-CM | POA: Diagnosis not present

## 2015-04-23 DIAGNOSIS — G4736 Sleep related hypoventilation in conditions classified elsewhere: Secondary | ICD-10-CM | POA: Diagnosis not present

## 2015-04-24 ENCOUNTER — Encounter: Payer: Self-pay | Admitting: Hematology & Oncology

## 2015-04-24 ENCOUNTER — Other Ambulatory Visit (HOSPITAL_BASED_OUTPATIENT_CLINIC_OR_DEPARTMENT_OTHER): Payer: BLUE CROSS/BLUE SHIELD

## 2015-04-24 ENCOUNTER — Ambulatory Visit (HOSPITAL_BASED_OUTPATIENT_CLINIC_OR_DEPARTMENT_OTHER): Payer: BLUE CROSS/BLUE SHIELD | Admitting: Hematology & Oncology

## 2015-04-24 VITALS — BP 111/72 | HR 78 | Temp 97.4°F | Resp 14 | Ht 72.0 in | Wt 249.0 lb

## 2015-04-24 DIAGNOSIS — C4371 Malignant melanoma of right lower limb, including hip: Secondary | ICD-10-CM

## 2015-04-24 LAB — CBC WITH DIFFERENTIAL (CANCER CENTER ONLY)
BASO#: 0 10*3/uL (ref 0.0–0.2)
BASO%: 0.7 % (ref 0.0–2.0)
EOS ABS: 0.2 10*3/uL (ref 0.0–0.5)
EOS%: 4.5 % (ref 0.0–7.0)
HCT: 32.3 % — ABNORMAL LOW (ref 38.7–49.9)
HGB: 10.8 g/dL — ABNORMAL LOW (ref 13.0–17.1)
LYMPH#: 1.4 10*3/uL (ref 0.9–3.3)
LYMPH%: 32.3 % (ref 14.0–48.0)
MCH: 30.3 pg (ref 28.0–33.4)
MCHC: 33.4 g/dL (ref 32.0–35.9)
MCV: 91 fL (ref 82–98)
MONO#: 0.4 10*3/uL (ref 0.1–0.9)
MONO%: 9.8 % (ref 0.0–13.0)
NEUT#: 2.3 10*3/uL (ref 1.5–6.5)
NEUT%: 52.7 % (ref 40.0–80.0)
PLATELETS: 200 10*3/uL (ref 145–400)
RBC: 3.57 10*6/uL — AB (ref 4.20–5.70)
RDW: 13.5 % (ref 11.1–15.7)
WBC: 4.4 10*3/uL (ref 4.0–10.0)

## 2015-04-24 LAB — COMPREHENSIVE METABOLIC PANEL
ALT: 16 U/L (ref 9–46)
AST: 19 U/L (ref 10–40)
Albumin: 4 g/dL (ref 3.6–5.1)
Alkaline Phosphatase: 50 U/L (ref 40–115)
BUN: 9 mg/dL (ref 7–25)
CALCIUM: 9.4 mg/dL (ref 8.6–10.3)
CHLORIDE: 105 mmol/L (ref 98–110)
CO2: 28 mmol/L (ref 20–31)
Creatinine, Ser: 1.18 mg/dL (ref 0.60–1.35)
GLUCOSE: 89 mg/dL (ref 65–99)
POTASSIUM: 3.2 mmol/L — AB (ref 3.5–5.3)
Sodium: 140 mmol/L (ref 135–146)
Total Bilirubin: 0.5 mg/dL (ref 0.2–1.2)
Total Protein: 6.2 g/dL (ref 6.1–8.1)

## 2015-04-24 NOTE — Progress Notes (Signed)
Hematology and Oncology Follow Up Visit  James Byrd [id number] 1965/06/27 49 y.o. 04/24/2015   Principle Diagnosis:   Stage IIIB [id number]) subungual melanoma of the right hallux  Current Therapy:    Yervoy-status post 3 cycles. Discontinued secondary to toxicity.     Interim History:  James Byrd is back for follow-up. He is looking much better. He really is getting back to his old baseline. He still is unable to go back to work. I still think this probably is another 2 months away. There he had a CT scan done yesterday. The CT scan did not show any evidence of recurrent/metastatic melanoma.  He had a sleep study last night. The results will only come back in another couple weeks.  He is getting physical therapy. He is doing this twice a week. He is happy that he is doing this.  His taste still is not that great. His taste buds probably took a big "hit" from all the medication that he had while hospitalized. I told him to try some baking soda and water.  He's had a problem with bowels or bladder. There is no diarrhea.  He's had no cough.  He's had no rashes. He has not noted any swollen lymph nodes.   Currently, his performance status is ECOG 1   Medications:  Current outpatient prescriptions:  .  acetaminophen (TYLENOL) 500 MG tablet, Take 500 mg by mouth every 6 (six) hours as needed for mild pain., Disp: , Rfl:  .  aspirin 81 MG tablet, Take 81 mg by mouth daily., Disp: , Rfl:  .  diltiazem (TIAZAC) 120 MG 24 hr capsule, Take 120 mg by mouth daily., Disp: , Rfl:  .  escitalopram (LEXAPRO) 10 MG tablet, Take 1 tablet (10 mg total) by mouth daily., Disp: 90 tablet, Rfl: 90 .  meclizine (ANTIVERT) 25 MG tablet, Take 25 mg by mouth 3 (three) times daily as needed for dizziness., Disp: , Rfl:  .  metoCLOPramide (REGLAN) 5 MG tablet, Take 1 tablet (5 mg total) by mouth every 6 (six) hours as needed for refractory nausea / vomiting., Disp: 20 tablet, Rfl: 0 .  Multiple  Vitamins-Minerals (MENS ONE DAILY PO), Take 1 tablet by mouth daily. , Disp: , Rfl:  .  ondansetron (ZOFRAN) 8 MG tablet, Take 1 mg by mouth every 8 (eight) hours as needed for nausea or vomiting., Disp: , Rfl:  .  pantoprazole (PROTONIX) 40 MG tablet, Take 1 tablet (40 mg total) by mouth at bedtime., Disp: 30 tablet, Rfl: 0 .  predniSONE (DELTASONE) 10 MG tablet, Take 0.5 tablets (5 mg total) by mouth daily with breakfast., Disp: 30 tablet, Rfl: 0 .  promethazine (PHENERGAN) 25 MG suppository, Place 25 mg rectally every 6 (six) hours as needed for nausea or vomiting., Disp: , Rfl:  .  sucralfate (CARAFATE) 1 G tablet, , Disp: , Rfl:  No current facility-administered medications for this visit.  Facility-Administered Medications Ordered in Other Visits:  .  0.9 %  sodium chloride infusion, , Intravenous, Continuous, [name], MD, Last Rate: 500 mL/hr at 02/05/15 1510  Allergies: No Known Allergies  Past Medical History, Surgical history, Social history, and Family History were reviewed and updated.  Review of Systems: As above  Physical Exam:  height is 6' (1.829 m) and weight is 249 lb (112.946 kg). His oral temperature is 97.4 F (36.3 C). His blood pressure is 111/72 and his pulse is 78. His respiration is 14.   Wt Readings from  Last 3 Encounters:  04/24/15 249 lb (112.946 kg)  04/23/15 245 lb (111.131 kg)  04/03/15 250 lb (113.399 kg)     Well developed and well nourished white male. Head and neck exam shows no ocular or oral lesions. There might be some slight injectable inflammation, more so on the left eye. He has no adenopathy in the neck. Lungs are clear. He has no rales, wheezes or rhonchi. Cardiac exam regular rate and rhythm with no murmurs, rubs or bruits. Abdomen is soft. He has good bowel sounds. There is no fluid wave. There is no palpable liver or spleen tip. Back exam shows no tenderness over the spine, ribs or hips. Extremities shows the amputated big toe of the  right foot. There is no swelling in the right leg. Skin exam shows no rashes or ecchymosis or petechia. Neurological exam is nonfocal.  Lab Results  Component Value Date   WBC 4.4 04/24/2015   HGB 10.8* 04/24/2015   HCT 32.3* 04/24/2015   MCV 91 04/24/2015   PLT 200 04/24/2015     Chemistry      Component Value Date/Time   NA 140 04/03/2015 1359   NA 137 03/16/2015 0435   NA 136 02/05/2015 1339   K 4.2 04/03/2015 1359   K 4.5 03/16/2015 0435   K 3.7 02/05/2015 1339   CL 103 03/16/2015 0435   CL 101 02/05/2015 1339   CO2 24 04/03/2015 1359   CO2 24 03/16/2015 0435   CO2 25 02/05/2015 1339   BUN 16.8 04/03/2015 1359   BUN 11 03/16/2015 0435   BUN 20 02/05/2015 1339   CREATININE 1.4* 04/03/2015 1359   CREATININE 3.25* 03/16/2015 0435   CREATININE 0.8 02/05/2015 1339      Component Value Date/Time   CALCIUM 10.2 04/03/2015 1359   CALCIUM 9.1 03/16/2015 0435   CALCIUM 8.8 02/05/2015 1339   ALKPHOS 61 04/03/2015 1359   ALKPHOS 45 03/16/2015 0435   ALKPHOS 37 02/05/2015 1339   AST 13 04/03/2015 1359   AST 14* 03/16/2015 0435   AST 21 02/05/2015 1339   ALT 10 04/03/2015 1359   ALT 16* 03/16/2015 0435   ALT 45 02/05/2015 1339   BILITOT 0.42 04/03/2015 1359   BILITOT 0.5 03/16/2015 0435   BILITOT 0.80 02/05/2015 1339         Impression and Plan: James Byrd is 49 year old gentleman with a stage IIIB melanoma of the right hallux that was subungual. He had a microscopic positive inguinal lymph node.  I must say that he has recovered quite well. He looks much better.   He did have a sleep study done yesterday. I suspect that he probably has some degree of sleep apnea.  I probably will not do another CT scan on for 3 months. I think given his risk of recurrence, it would be helpful for [place] to get the CT scan.  His physical therapy is also help him out big time.  For now, I will plan to get him back in 4-6 weeks.  I spent about 30 minutes with he and his wife.       [name], MD 11/4/20164:15 PM

## 2015-04-25 DIAGNOSIS — G4730 Sleep apnea, unspecified: Secondary | ICD-10-CM | POA: Diagnosis not present

## 2015-04-25 LAB — LACTATE DEHYDROGENASE: LDH: 134 U/L (ref 94–250)

## 2015-04-25 LAB — VITAMIN D 25 HYDROXY (VIT D DEFICIENCY, FRACTURES): VIT D 25 HYDROXY: 29 ng/mL — AB (ref 30–100)

## 2015-04-25 LAB — TESTOSTERONE: TESTOSTERONE: 594 ng/dL (ref 300–890)

## 2015-04-25 NOTE — Progress Notes (Signed)
  Patient Name: Equan, Eisenzimmer Date: 04/23/2015 Gender: Male D.O.B: Dec 11, 1965 Age (years): 48 Referring Provider: Cherene Altes Height (inches): 48 Interpreting Physician: Baird Lyons MD, ABSM Weight (lbs): 245 RPSGT: Laren Everts BMI: 33 MRN: [id number] Neck Size: 20.00 CLINICAL INFORMATION Sleep Study Type: NPSG Indication for sleep study: Fatigue, Obesity, Snoring, Witnessed Apneas Epworth Sleepiness Score: 3  SLEEP STUDY TECHNIQUE As per the AASM Manual for the Scoring of Sleep and Associated Events v2.3 (April 2016) with a hypopnea requiring 4% desaturations. The channels recorded and monitored were frontal, central and occipital EEG, electrooculogram (EOG), submentalis EMG (chin), nasal and oral airflow, thoracic and abdominal wall motion, anterior tibialis EMG, snore microphone, electrocardiogram, and pulse oximetry. MEDICATIONS Patient's medications include: charted for review. Medications self-administered by patient during sleep study : No sleep medicine administered.  SLEEP ARCHITECTURE The study was initiated at 10:20:04 PM and ended at 4:47:37 AM. Sleep onset time was 17.9 minutes and the sleep efficiency was 76.8%. The total sleep time was 297.5 minutes. Stage REM latency was 156.0 minutes. The patient spent 21.68% of the night in stage N1 sleep, 63.87% in stage N2 sleep, 0.00% in stage N3 and 14.45% in REM. Alpha intrusion was absent. Supine sleep was 19.69%. Wake after sleep onset 72.2 minutes  RESPIRATORY PARAMETERS The overall apnea/hypopnea index (AHI) was 7.1 per hour. There were 6 total apneas, including 6 obstructive, 0 central and 0 mixed apneas. There were 29 hypopneas and 144 RERAs. The AHI during Stage REM sleep was 12.6 per hour. AHI while supine was 26.6 per hour. The mean oxygen saturation was 89.95%. The minimum SpO2 during sleep was 82.00%. Supplemental oxygen was added per protocol at 23:46 PM per protocol, due to sustained  desaturation. Moderate snoring was noted during this study.  CARDIAC DATA The 2 lead EKG demonstrated sinus rhythm. The mean heart rate was 69.39 beats per minute. Other EKG findings include: None.  LEG MOVEMENT DATA The total PLMS were 30 with a resulting PLMS index of 6.05. Associated arousal with leg movement index was 0.8 .  IMPRESSIONS - Mild obstructive sleep apnea occurred during this study (AHI = 7.1/h). - There were not enough early events to meet protocol requirements for split CPAP titration on this study. - No significant central sleep apnea occurred during this study (CAI = 0.0/h). - Oxygen desaturation to a nadir of 82%. Supplemental oxygen was added at 1 L/M at 23:46 PM due to sustained oxygen desaturation below 88% on room air. - The patient snored with Moderate snoring volume. - No cardiac abnormalities were noted during this study. - Mild periodic limb movements of sleep occurred during the study. No significant associated arousals.  DIAGNOSIS - Obstructive Sleep Apnea (327.23 [G47.33 ICD-10]) - Nocturnal Hypoxemia (327.26 [G47.36 ICD-10])  RECOMMENDATIONS - Positional therapy avoiding supine position during sleep. - Very mild obstructive sleep apnea. Return to discuss treatment options. - Assess for sleep hypoxemia potentially requiring home oxygen during sleep. Underlying cardiopulmonary disease is implied. - Avoid alcohol, sedatives and other CNS depressants that may worsen sleep apnea and disrupt normal sleep architecture. - Sleep hygiene should be reviewed to assess factors that may improve sleep quality. - Weight management and regular exercise should be initiated or continued if appropriate.  Deneise Lever Diplomate, American Board of Sleep Medicine  ELECTRONICALLY SIGNED ON:  04/25/2015, 1:20 PM [place] PH: [phone]   FX: [phone] [place]

## 2015-04-27 ENCOUNTER — Telehealth: Payer: Self-pay | Admitting: Nurse Practitioner

## 2015-04-27 NOTE — Telephone Encounter (Signed)
Spoke with wife and she stated she would get Vitamin D OTC and began him taking 2000u as order by Dr. Marin Olp. She also stated he was having some leg pain mostly first thing in the morning. Per Dr. Marin Olp advised him to take ibuprofen with food and see if this helps. Wife verbalized understanding and appreciation.

## 2015-05-01 ENCOUNTER — Telehealth: Payer: Self-pay | Admitting: Hematology & Oncology

## 2015-05-01 NOTE — Telephone Encounter (Signed)
Faxed [place] forms to:  F: [phone] P: [phone]   [id number] Claim: [id number]   Also, mailed to orig to the home, per his wife Ladona Ridgel SCANNED

## 2015-05-08 ENCOUNTER — Other Ambulatory Visit: Payer: BLUE CROSS/BLUE SHIELD

## 2015-05-08 ENCOUNTER — Ambulatory Visit: Payer: BLUE CROSS/BLUE SHIELD | Admitting: Hematology & Oncology

## 2015-05-20 ENCOUNTER — Telehealth: Payer: Self-pay | Admitting: *Deleted

## 2015-05-20 DIAGNOSIS — R64 Cachexia: Secondary | ICD-10-CM

## 2015-05-20 MED ORDER — MEGESTROL ACETATE 625 MG/5ML PO SUSP: 625.0000 mg | Freq: Every day | ORAL | Status: DC

## 2015-05-20 NOTE — Telephone Encounter (Signed)
Patient is c/o not having an appetite. He doesn't want to eat. He continues to lose weight. He is weak and fatigue. He is still participating in physical therapy. He drink Ensure but not often. Reviewed symptoms with Dr Marin Olp and he would like patient to drink Ensure three times a day, increase his po intake and he will prescribe Megace in an attempt to increase appetite. Patient's wife aware.

## 2015-06-05 ENCOUNTER — Ambulatory Visit (HOSPITAL_BASED_OUTPATIENT_CLINIC_OR_DEPARTMENT_OTHER): Payer: BLUE CROSS/BLUE SHIELD | Admitting: Hematology & Oncology

## 2015-06-05 ENCOUNTER — Other Ambulatory Visit (HOSPITAL_BASED_OUTPATIENT_CLINIC_OR_DEPARTMENT_OTHER): Payer: BLUE CROSS/BLUE SHIELD

## 2015-06-05 ENCOUNTER — Encounter: Payer: Self-pay | Admitting: Hematology & Oncology

## 2015-06-05 VITALS — BP 109/71 | HR 125 | Temp 98.2°F | Resp 16 | Ht 72.0 in | Wt 229.0 lb

## 2015-06-05 DIAGNOSIS — C4371 Malignant melanoma of right lower limb, including hip: Secondary | ICD-10-CM | POA: Diagnosis not present

## 2015-06-05 LAB — CBC WITH DIFFERENTIAL (CANCER CENTER ONLY)
BASO#: 0 10*3/uL (ref 0.0–0.2)
BASO%: 0.4 % (ref 0.0–2.0)
EOS ABS: 0.3 10*3/uL (ref 0.0–0.5)
EOS%: 5.6 % (ref 0.0–7.0)
HCT: 35.8 % — ABNORMAL LOW (ref 38.7–49.9)
HEMOGLOBIN: 12 g/dL — AB (ref 13.0–17.1)
LYMPH#: 1.6 10*3/uL (ref 0.9–3.3)
LYMPH%: 29.8 % (ref 14.0–48.0)
MCH: 29.1 pg (ref 28.0–33.4)
MCHC: 33.5 g/dL (ref 32.0–35.9)
MCV: 87 fL (ref 82–98)
MONO#: 0.6 10*3/uL (ref 0.1–0.9)
MONO%: 10.4 % (ref 0.0–13.0)
NEUT%: 53.8 % (ref 40.0–80.0)
NEUTROS ABS: 2.9 10*3/uL (ref 1.5–6.5)
PLATELETS: 213 10*3/uL (ref 145–400)
RBC: 4.13 10*6/uL — ABNORMAL LOW (ref 4.20–5.70)
RDW: 12.9 % (ref 11.1–15.7)
WBC: 5.4 10*3/uL (ref 4.0–10.0)

## 2015-06-05 LAB — COMPREHENSIVE METABOLIC PANEL (CC13)
ALBUMIN: 4.2 g/dL (ref 3.6–5.1)
ALT: 9 U/L (ref 9–46)
AST: 18 U/L (ref 10–40)
Alkaline Phosphatase: 45 U/L (ref 40–115)
BILIRUBIN TOTAL: 0.6 mg/dL (ref 0.2–1.2)
BUN: 18 mg/dL (ref 7–25)
CHLORIDE: 102 mmol/L (ref 98–110)
CO2: 22 mmol/L (ref 20–31)
CREATININE: 1.14 mg/dL (ref 0.60–1.35)
Calcium: 10.2 mg/dL (ref 8.6–10.3)
GLUCOSE: 77 mg/dL (ref 65–99)
Potassium: 3.7 mmol/L (ref 3.5–5.3)
SODIUM: 138 mmol/L (ref 135–146)
Total Protein: 6.5 g/dL (ref 6.1–8.1)

## 2015-06-08 LAB — LACTATE DEHYDROGENASE: LDH: 148 U/L (ref 125–245)

## 2015-06-08 NOTE — Progress Notes (Signed)
Hematology and Oncology Follow Up Visit  Kejon Orsborn [id number] 1965/07/18 49 y.o. 06/08/2015   Principle Diagnosis:   Stage IIIB [id number] subungual melanoma of the right hallux  Current Therapy:    Yervoy-status post 3 cycles. Discontinued secondary to toxicity.     Interim History:  Mr. Gable is back for follow-up. He is looking about the same. In fact, his seems like he may have regressed a little bit. He just has no appetite. He is tired all the time. He sleeps. We have checked for hypothyroidism. Everything came back okay.   He just has not recovered from the [facility]. He really had a incredibly miserable time with the urine void. I had not seeing anybody have as many toxicities as he has had.   He is not even close to being ready for work. He really cannot even emulate more than 15-20 feet before he has to stop. He comes in today in a wheelchair.   He is not hurting as much. There is still some arthralgias.   He's not had diarrhea.   He's had an occasional cough but no shortness of breath. The cough is not productive.   His been no bleeding. He's had no rashes. He's had no bruising. He has not noted any swollen lymph nodes.   Currently, his performance status is ECOG 2   Medications:  Current outpatient prescriptions:  .  acetaminophen (TYLENOL) 500 MG tablet, Take 500 mg by mouth every 6 (six) hours as needed for mild pain., Disp: , Rfl:  .  aspirin 81 MG tablet, Take 81 mg by mouth daily., Disp: , Rfl:  .  diltiazem (TIAZAC) 120 MG 24 hr capsule, Take 120 mg by mouth daily., Disp: , Rfl:  .  escitalopram (LEXAPRO) 10 MG tablet, Take 1 tablet (10 mg total) by mouth daily., Disp: 90 tablet, Rfl: 90 .  metoCLOPramide (REGLAN) 5 MG tablet, Take 1 tablet (5 mg total) by mouth every 6 (six) hours as needed for refractory nausea / vomiting., Disp: 20 tablet, Rfl: 0 .  Multiple Vitamins-Minerals (MENS ONE DAILY PO), Take 1 tablet by mouth daily. , Disp: , Rfl:  .   pantoprazole (PROTONIX) 40 MG tablet, Take 1 tablet (40 mg total) by mouth at bedtime., Disp: 30 tablet, Rfl: 0 .  pravastatin (PRAVACHOL) 40 MG tablet, Take 40 mg by mouth at bedtime., Disp: , Rfl: 1 .  promethazine (PHENERGAN) 25 MG suppository, Place 25 mg rectally every 6 (six) hours as needed for nausea or vomiting., Disp: , Rfl:  No current facility-administered medications for this visit.  Facility-Administered Medications Ordered in Other Visits:  .  0.9 %  sodium chloride infusion, , Intravenous, Continuous, [name], MD, Last Rate: 500 mL/hr at 02/05/15 1510  Allergies: No Known Allergies  Past Medical History, Surgical history, Social history, and Family History were reviewed and updated.  Review of Systems: As above  Physical Exam:  height is 6' (1.829 m) and weight is 229 lb (103.874 kg). His oral temperature is 98.2 F (36.8 C). His blood pressure is 109/71 and his pulse is 125. His respiration is 16.   Wt Readings from Last 3 Encounters:  06/05/15 229 lb (103.874 kg)  04/24/15 249 lb (112.946 kg)  04/23/15 245 lb (111.131 kg)     Well developed and well nourished white male. Head and neck exam shows no ocular or oral lesions. There might be some slight injectable inflammation, more so on the left eye. He has no adenopathy  in the neck. Lungs are clear. He has no rales, wheezes or rhonchi. Cardiac exam regular rate and rhythm with no murmurs, rubs or bruits. Abdomen is soft. He has good bowel sounds. There is no fluid wave. There is no palpable liver or spleen tip. Back exam shows no tenderness over the spine, ribs or hips. Extremities shows the amputated big toe of the right foot. There is no swelling in the right leg. Skin exam shows no rashes or ecchymosis or petechia. Neurological exam is nonfocal.  Lab Results  Component Value Date   WBC 5.4 06/05/2015   HGB 12.0* 06/05/2015   HCT 35.8* 06/05/2015   MCV 87 06/05/2015   PLT 213 06/05/2015     Chemistry       Component Value Date/Time   NA 138 06/05/2015 1437   NA 140 04/03/2015 1359   NA 136 02/05/2015 1339   K 3.7 06/05/2015 1437   K 4.2 04/03/2015 1359   K 3.7 02/05/2015 1339   CL 102 06/05/2015 1437   CL 101 02/05/2015 1339   CO2 22 06/05/2015 1437   CO2 24 04/03/2015 1359   CO2 25 02/05/2015 1339   BUN 18 06/05/2015 1437   BUN 16.8 04/03/2015 1359   BUN 20 02/05/2015 1339   CREATININE 1.14 06/05/2015 1437   CREATININE 1.4* 04/03/2015 1359   CREATININE 0.8 02/05/2015 1339      Component Value Date/Time   CALCIUM 10.2 06/05/2015 1437   CALCIUM 10.2 04/03/2015 1359   CALCIUM 8.8 02/05/2015 1339   ALKPHOS 45 06/05/2015 1437   ALKPHOS 61 04/03/2015 1359   ALKPHOS 37 02/05/2015 1339   AST 18 06/05/2015 1437   AST 13 04/03/2015 1359   AST 21 02/05/2015 1339   ALT 9 06/05/2015 1437   ALT 10 04/03/2015 1359   ALT 45 02/05/2015 1339   BILITOT 0.6 06/05/2015 1437   BILITOT 0.42 04/03/2015 1359   BILITOT 0.80 02/05/2015 1339         Impression and Plan: Mr. Householder is 49 year old gentleman with a stage IIIB melanoma of the right hallux that was subungual. He had a microscopic positive inguinal lymph node.  I have just puzzled as to why he is taking so long to recover. He still is not even close to where he was before we started the [facility].  I will go ahead and get a MRI of the brain just make sure I'm not missing something. I don't think he has hypopituitary-ism. I for this with Yervoy. I would think that I would see other evidence of this.  His scans that he had done recently looked fantastic so there is no evidence of recurrent disease.  He really had a miserable time with the [facility] and maybe it will just take a while before he recovers.  I would like to see him back in another 6 weeks or so.  I spent close to 45 minutes with he and his wife.   [name], MD 12/19/20163:21 PM

## 2015-06-10 ENCOUNTER — Telehealth: Payer: Self-pay | Admitting: Hematology & Oncology

## 2015-06-10 NOTE — Telephone Encounter (Signed)
Per pof to sch mri of brain.  i called [facility] and the patient's wife had called and scheduled mri already.  Mri is sch for 06/16/15.  February apt was mailed to patient's home

## 2015-06-18 ENCOUNTER — Other Ambulatory Visit: Payer: Self-pay | Admitting: Nurse Practitioner

## 2015-07-02 ENCOUNTER — Telehealth: Payer: Self-pay | Admitting: *Deleted

## 2015-07-02 DIAGNOSIS — C4371 Malignant melanoma of right lower limb, including hip: Secondary | ICD-10-CM

## 2015-07-02 MED ORDER — ONDANSETRON HCL 8 MG PO TABS: 8.0000 mg | ORAL_TABLET | Freq: Three times a day (TID) | ORAL | Status: DC | PRN

## 2015-07-02 NOTE — Telephone Encounter (Signed)
Patient is still having problems with nausea/vomitting. He can eat liquids, which stay down, but anything solid is thrown back up. He last saw his GI doc last month. He was taken off some of his medications at that time.  Dr Marin Olp wants patient to have zofran for use prn before meals and for patient to contact his GI doc and inform him of the recent worsening of symptoms. Wife agrees to call them. New prescription sent. Wife aware.

## 2015-07-03 ENCOUNTER — Encounter: Payer: Self-pay | Admitting: Hematology & Oncology

## 2015-07-20 ENCOUNTER — Other Ambulatory Visit: Payer: Self-pay | Admitting: *Deleted

## 2015-07-20 DIAGNOSIS — C4371 Malignant melanoma of right lower limb, including hip: Secondary | ICD-10-CM

## 2015-07-20 MED ORDER — ESCITALOPRAM OXALATE 10 MG PO TABS: 10.0000 mg | ORAL_TABLET | Freq: Every day | ORAL | Status: DC

## 2015-07-22 ENCOUNTER — Ambulatory Visit: Payer: BLUE CROSS/BLUE SHIELD | Admitting: Hematology & Oncology

## 2015-07-22 ENCOUNTER — Other Ambulatory Visit: Payer: BLUE CROSS/BLUE SHIELD

## 2015-07-24 ENCOUNTER — Other Ambulatory Visit (HOSPITAL_BASED_OUTPATIENT_CLINIC_OR_DEPARTMENT_OTHER): Payer: BLUE CROSS/BLUE SHIELD

## 2015-07-24 ENCOUNTER — Encounter: Payer: Self-pay | Admitting: Hematology & Oncology

## 2015-07-24 ENCOUNTER — Ambulatory Visit (HOSPITAL_BASED_OUTPATIENT_CLINIC_OR_DEPARTMENT_OTHER): Payer: BLUE CROSS/BLUE SHIELD | Admitting: Hematology & Oncology

## 2015-07-24 VITALS — BP 102/66 | HR 85 | Temp 98.0°F | Resp 16 | Ht 72.0 in | Wt 206.0 lb

## 2015-07-24 DIAGNOSIS — H109 Unspecified conjunctivitis: Secondary | ICD-10-CM

## 2015-07-24 DIAGNOSIS — C4371 Malignant melanoma of right lower limb, including hip: Secondary | ICD-10-CM

## 2015-07-24 LAB — COMPREHENSIVE METABOLIC PANEL
ALBUMIN: 4 g/dL (ref 3.5–5.0)
ALK PHOS: 60 U/L (ref 40–150)
ALT: 11 U/L (ref 0–55)
ANION GAP: 12 meq/L — AB (ref 3–11)
AST: 17 U/L (ref 5–34)
BILIRUBIN TOTAL: 0.58 mg/dL (ref 0.20–1.20)
BUN: 17.5 mg/dL (ref 7.0–26.0)
CALCIUM: 10.8 mg/dL — AB (ref 8.4–10.4)
CO2: 23 meq/L (ref 22–29)
Chloride: 106 mEq/L (ref 98–109)
Creatinine: 1.2 mg/dL (ref 0.7–1.3)
EGFR: 71 mL/min/{1.73_m2} — AB (ref >90)
Glucose: 89 mg/dl (ref 70–140)
Potassium: 3.7 mEq/L (ref 3.5–5.1)
Sodium: 141 mEq/L (ref 136–145)
TOTAL PROTEIN: 7.5 g/dL (ref 6.4–8.3)

## 2015-07-24 LAB — CBC WITH DIFFERENTIAL (CANCER CENTER ONLY)
BASO#: 0 10*3/uL (ref 0.0–0.2)
BASO%: 0.2 % (ref 0.0–2.0)
EOS%: 3.5 % (ref 0.0–7.0)
Eosinophils Absolute: 0.2 10*3/uL (ref 0.0–0.5)
HEMATOCRIT: 35.1 % — AB (ref 38.7–49.9)
HEMOGLOBIN: 11.7 g/dL — AB (ref 13.0–17.1)
LYMPH#: 1.7 10*3/uL (ref 0.9–3.3)
LYMPH%: 37.1 % (ref 14.0–48.0)
MCH: 28.1 pg (ref 28.0–33.4)
MCHC: 33.3 g/dL (ref 32.0–35.9)
MCV: 84 fL (ref 82–98)
MONO#: 0.4 10*3/uL (ref 0.1–0.9)
MONO%: 9.2 % (ref 0.0–13.0)
NEUT%: 50 % (ref 40.0–80.0)
NEUTROS ABS: 2.3 10*3/uL (ref 1.5–6.5)
Platelets: 248 10*3/uL (ref 145–400)
RBC: 4.16 10*6/uL — AB (ref 4.20–5.70)
RDW: 13.4 % (ref 11.1–15.7)
WBC: 4.6 10*3/uL (ref 4.0–10.0)

## 2015-07-24 LAB — LACTATE DEHYDROGENASE: LDH: 130 U/L (ref 125–245)

## 2015-07-24 MED ORDER — DRONABINOL 5 MG PO CAPS: 5.0000 mg | ORAL_CAPSULE | Freq: Two times a day (BID) | ORAL | Status: DC

## 2015-07-24 NOTE — Progress Notes (Signed)
Hematology and Oncology Follow Up Visit  James Byrd [id number] 1965-08-09 50 y.o. 07/24/2015   Principle Diagnosis:   Stage IIIB [id number]) subungual melanoma of the right hallux  Current Therapy:    Yervoy-status post 3 cycles. Discontinued secondary to toxicity.     Interim History:  James Byrd is back for follow-up. He is looking about the same. He is still this in some weight. He probably has lost 70 pounds since we first started seeing him. He just does not have much of a taste for food.  I will try him on some Marinol. I will try 5 mg twice a day.  So far, this been no evidence of any recurrence of his melanoma. He has not had any rashes. He's had no leg swelling. He's had no diarrhea. He's had no constipation. He's had no cough. He's had no fever. He's had no bleeding. He's had no mouth sores. He's had no headache.  Currently, his performance status is ECOG 2   Medications:  Current outpatient prescriptions:  .  aspirin 81 MG tablet, Take 81 mg by mouth daily., Disp: , Rfl:  .  diltiazem (TIAZAC) 120 MG 24 hr capsule, Take 120 mg by mouth daily., Disp: , Rfl:  .  escitalopram (LEXAPRO) 10 MG tablet, Take 1 tablet (10 mg total) by mouth daily., Disp: 90 tablet, Rfl: 3 .  Multiple Vitamins-Minerals (MENS ONE DAILY PO), Take 1 tablet by mouth daily. , Disp: , Rfl:  .  pantoprazole (PROTONIX) 40 MG tablet, Take 1 tablet (40 mg total) by mouth at bedtime., Disp: 30 tablet, Rfl: 0 .  dronabinol (MARINOL) 5 MG capsule, Take 1 capsule (5 mg total) by mouth 2 (two) times daily before a meal., Disp: 60 capsule, Rfl: 0 No current facility-administered medications for this visit.  Facility-Administered Medications Ordered in Other Visits:  .  0.9 %  sodium chloride infusion, , Intravenous, Continuous, [name], MD, Last Rate: 500 mL/hr at 02/05/15 1510  Allergies: No Known Allergies  Past Medical History, Surgical history, Social history, and Family History were reviewed and  updated.  Review of Systems: As above  Physical Exam:  height is 6' (1.829 m) and weight is 206 lb (93.441 kg). His oral temperature is 98 F (36.7 C). His blood pressure is 102/66 and his pulse is 85. His respiration is 16.   Wt Readings from Last 3 Encounters:  07/24/15 206 lb (93.441 kg)  06/05/15 229 lb (103.874 kg)  04/24/15 249 lb (112.946 kg)     Well developed and well nourished white male. Head and neck exam shows no ocular or oral lesions. There might be some slight injectable inflammation, more so on the left eye. He has no adenopathy in the neck. Lungs are clear. He has no rales, wheezes or rhonchi. Cardiac exam regular rate and rhythm with no murmurs, rubs or bruits. Abdomen is soft. He has good bowel sounds. There is no fluid wave. There is no palpable liver or spleen tip. Back exam shows no tenderness over the spine, ribs or hips. Extremities shows the amputated big toe of the right foot. There is no swelling in the right leg. Skin exam shows no rashes or ecchymosis or petechia. Neurological exam is nonfocal.  Lab Results  Component Value Date   WBC 4.6 07/24/2015   HGB 11.7* 07/24/2015   HCT 35.1* 07/24/2015   MCV 84 07/24/2015   PLT 248 07/24/2015     Chemistry      Component Value Date/Time  NA 138 06/05/2015 1437   NA 140 04/03/2015 1359   NA 136 02/05/2015 1339   K 3.7 06/05/2015 1437   K 4.2 04/03/2015 1359   K 3.7 02/05/2015 1339   CL 102 06/05/2015 1437   CL 101 02/05/2015 1339   CO2 22 06/05/2015 1437   CO2 24 04/03/2015 1359   CO2 25 02/05/2015 1339   BUN 18 06/05/2015 1437   BUN 16.8 04/03/2015 1359   BUN 20 02/05/2015 1339   CREATININE 1.14 06/05/2015 1437   CREATININE 1.4* 04/03/2015 1359   CREATININE 0.8 02/05/2015 1339      Component Value Date/Time   CALCIUM 10.2 06/05/2015 1437   CALCIUM 10.2 04/03/2015 1359   CALCIUM 8.8 02/05/2015 1339   ALKPHOS 45 06/05/2015 1437   ALKPHOS 61 04/03/2015 1359   ALKPHOS 37 02/05/2015 1339   AST  18 06/05/2015 1437   AST 13 04/03/2015 1359   AST 21 02/05/2015 1339   ALT 9 06/05/2015 1437   ALT 10 04/03/2015 1359   ALT 45 02/05/2015 1339   BILITOT 0.6 06/05/2015 1437   BILITOT 0.42 04/03/2015 1359   BILITOT 0.80 02/05/2015 1339         Impression and Plan: James Byrd is 50 year old gentleman with a stage IIIB melanoma of the right hallux that was subungual. He had a microscopic positive inguinal lymph node.  He still is not back to feeling all that well.  I have to believe that his testosterone is given below. He certainly is behaving as if he is in "male menopause". We will see what his level is.  I will give him some Marinol to try to help with his appetite.  We have looked at all other issues that might because his problem. So far with not found anything that we can reverse.  I suppose it still might be the [facility] that he received. It is been probably 6 months since he has last had Yervoy.  As always, I will continue to pray for him.  I will like to see him back in about 4 weeks or so.  [name], MD 2/3/20171:42 PM

## 2015-07-25 LAB — VITAMIN D 25 HYDROXY (VIT D DEFICIENCY, FRACTURES): VIT D 25 HYDROXY: 34.3 ng/mL (ref 30.0–100.0)

## 2015-07-25 LAB — TESTOSTERONE: Testosterone, Serum: 1241 ng/dL — ABNORMAL HIGH (ref 348–1197)

## 2015-07-27 LAB — TSH: TSH: 2.751 m[IU]/L (ref 0.320–4.118)

## 2015-07-28 ENCOUNTER — Telehealth: Payer: Self-pay | Admitting: *Deleted

## 2015-07-28 NOTE — Telephone Encounter (Addendum)
Patient aware of results.   ----- Message from [name], MD sent at 07/27/2015  5:27 PM EST ----- Call - thyroid, vit D and testosterone levels are ok!!  I still cannot find any condition to treat to make you feel better!!  i am praying for you!!  pete

## 2015-08-13 ENCOUNTER — Encounter: Payer: Self-pay | Admitting: Hematology & Oncology

## 2015-09-04 ENCOUNTER — Other Ambulatory Visit (HOSPITAL_BASED_OUTPATIENT_CLINIC_OR_DEPARTMENT_OTHER): Payer: BLUE CROSS/BLUE SHIELD

## 2015-09-04 ENCOUNTER — Ambulatory Visit (HOSPITAL_BASED_OUTPATIENT_CLINIC_OR_DEPARTMENT_OTHER): Payer: BLUE CROSS/BLUE SHIELD | Admitting: Hematology & Oncology

## 2015-09-04 ENCOUNTER — Encounter: Payer: Self-pay | Admitting: Hematology & Oncology

## 2015-09-04 VITALS — BP 107/67 | HR 75 | Temp 97.7°F | Resp 16 | Ht 72.0 in | Wt 198.0 lb

## 2015-09-04 DIAGNOSIS — R5383 Other fatigue: Secondary | ICD-10-CM | POA: Diagnosis not present

## 2015-09-04 DIAGNOSIS — C4371 Malignant melanoma of right lower limb, including hip: Secondary | ICD-10-CM

## 2015-09-04 DIAGNOSIS — R634 Abnormal weight loss: Secondary | ICD-10-CM

## 2015-09-04 DIAGNOSIS — D508 Other iron deficiency anemias: Secondary | ICD-10-CM

## 2015-09-04 DIAGNOSIS — G47429 Narcolepsy in conditions classified elsewhere without cataplexy: Secondary | ICD-10-CM

## 2015-09-04 DIAGNOSIS — M810 Age-related osteoporosis without current pathological fracture: Secondary | ICD-10-CM

## 2015-09-04 DIAGNOSIS — G722 Myopathy due to other toxic agents: Secondary | ICD-10-CM

## 2015-09-04 LAB — COMPREHENSIVE METABOLIC PANEL
ALT: 9 U/L (ref 0–55)
AST: 16 U/L (ref 5–34)
Albumin: 4.1 g/dL (ref 3.5–5.0)
Alkaline Phosphatase: 64 U/L (ref 40–150)
Anion Gap: 8 mEq/L (ref 3–11)
BUN: 23.4 mg/dL (ref 7.0–26.0)
CALCIUM: 10.3 mg/dL (ref 8.4–10.4)
CHLORIDE: 105 meq/L (ref 98–109)
CO2: 24 meq/L (ref 22–29)
CREATININE: 1.2 mg/dL (ref 0.7–1.3)
EGFR: 69 mL/min/{1.73_m2} — ABNORMAL LOW (ref >90)
GLUCOSE: 100 mg/dL (ref 70–140)
Potassium: 3.9 mEq/L (ref 3.5–5.1)
SODIUM: 137 meq/L (ref 136–145)
Total Bilirubin: 0.51 mg/dL (ref 0.20–1.20)
Total Protein: 7.6 g/dL (ref 6.4–8.3)

## 2015-09-04 LAB — CBC WITH DIFFERENTIAL (CANCER CENTER ONLY)
BASO#: 0 10*3/uL (ref 0.0–0.2)
BASO%: 0.2 % (ref 0.0–2.0)
EOS ABS: 0.2 10*3/uL (ref 0.0–0.5)
EOS%: 3.9 % (ref 0.0–7.0)
HCT: 34.3 % — ABNORMAL LOW (ref 38.7–49.9)
HGB: 11.6 g/dL — ABNORMAL LOW (ref 13.0–17.1)
LYMPH#: 2.3 10*3/uL (ref 0.9–3.3)
LYMPH%: 42 % (ref 14.0–48.0)
MCH: 28.4 pg (ref 28.0–33.4)
MCHC: 33.8 g/dL (ref 32.0–35.9)
MCV: 84 fL (ref 82–98)
MONO#: 0.3 10*3/uL (ref 0.1–0.9)
MONO%: 6.3 % (ref 0.0–13.0)
NEUT#: 2.6 10*3/uL (ref 1.5–6.5)
NEUT%: 47.6 % (ref 40.0–80.0)
PLATELETS: 236 10*3/uL (ref 145–400)
RBC: 4.08 10*6/uL — ABNORMAL LOW (ref 4.20–5.70)
RDW: 13.1 % (ref 11.1–15.7)
WBC: 5.4 10*3/uL (ref 4.0–10.0)

## 2015-09-04 LAB — LACTATE DEHYDROGENASE: LDH: 129 U/L (ref 125–245)

## 2015-09-04 MED ORDER — METHYLPHENIDATE HCL 10 MG PO TABS: 10.0000 mg | ORAL_TABLET | Freq: Two times a day (BID) | ORAL | Status: DC

## 2015-09-04 NOTE — Progress Notes (Signed)
Hematology and Oncology Follow Up Visit  James Byrd [id number] [birth year] 50 y.o. 09/04/2015   Principle Diagnosis:   Stage IIIB [id number]) subungual melanoma of the right hallux  Current Therapy:    Yervoy-status post 3 cycles. Discontinued secondary to toxicity.     Interim History:  Mr. James Byrd is back for follow-up. He is looking about the same. He is still lost some weight. He probably has lost 70 pounds since we first started seeing him. He just does not have much of a taste for food.  He is still incredibly fatigued. He has a lot of arthralgias and myalgias. It is hard for him to walk. He just is so stiff.  I will see if some Ritalin might help. I will note this might be able to give him a little bit more energy.  His labs have never look that bad. His testosterone is fantastic. I am checking an iron level on him.  His last scans were done in October. They all looked okay.  So far, this been no evidence of any recurrence of his melanoma. He has not had any rashes. He's had no leg swelling. He's had no diarrhea. He's had no constipation. He's had no cough. He's had no fever. He's had no bleeding. He's had no mouth sores. He's had no headache.  Currently, his performance status is ECOG 2   Medications:  Current outpatient prescriptions:  .  aspirin 81 MG tablet, Take 81 mg by mouth daily., Disp: , Rfl:  .  diltiazem (TIAZAC) 120 MG 24 hr capsule, Take 120 mg by mouth daily., Disp: , Rfl:  .  dronabinol (MARINOL) 5 MG capsule, Take 1 capsule (5 mg total) by mouth 2 (two) times daily before a meal., Disp: 60 capsule, Rfl: 0 .  escitalopram (LEXAPRO) 10 MG tablet, Take 1 tablet (10 mg total) by mouth daily., Disp: 90 tablet, Rfl: 3 .  Multiple Vitamins-Minerals (MENS ONE DAILY PO), Take 1 tablet by mouth daily. , Disp: , Rfl:  .  pravastatin (PRAVACHOL) 40 MG tablet, , Disp: , Rfl:  .  methylphenidate (RITALIN) 10 MG tablet, Take 1 tablet (10 mg total) by mouth 2 (two) times  daily., Disp: 60 tablet, Rfl: 0 .  pantoprazole (PROTONIX) 40 MG tablet, Take 1 tablet (40 mg total) by mouth at bedtime., Disp: 30 tablet, Rfl: 0 No current facility-administered medications for this visit.  Facility-Administered Medications Ordered in Other Visits:  .  0.9 %  sodium chloride infusion, , Intravenous, Continuous, [name], MD, Last Rate: 500 mL/hr at 02/05/15 1510  Allergies: No Known Allergies  Past Medical History, Surgical history, Social history, and Family History were reviewed and updated.  Review of Systems: As above  Physical Exam:  height is 6' (1.829 m) and weight is 198 lb (89.812 kg). His oral temperature is 97.7 F (36.5 C). His blood pressure is 107/67 and his pulse is 75. His respiration is 16.   Wt Readings from Last 3 Encounters:  09/04/15 198 lb (89.812 kg)  07/24/15 206 lb (93.441 kg)  06/05/15 229 lb (103.874 kg)     Well developed and well nourished white male. Head and neck exam shows no ocular or oral lesions. There might be some slight injectable inflammation, more so on the left eye. He has no adenopathy in the neck. Lungs are clear. He has no rales, wheezes or rhonchi. Cardiac exam regular rate and rhythm with no murmurs, rubs or bruits. Abdomen is soft. He has good bowel  sounds. There is no fluid wave. There is no palpable liver or spleen tip. Back exam shows no tenderness over the spine, ribs or hips. Extremities shows the amputated big toe of the right foot. There is no swelling in the right leg. Skin exam shows no rashes or ecchymosis or petechia. Neurological exam is nonfocal.  Lab Results  Component Value Date   WBC 5.4 09/04/2015   HGB 11.6* 09/04/2015   HCT 34.3* 09/04/2015   MCV 84 09/04/2015   PLT 236 09/04/2015     Chemistry      Component Value Date/Time   NA 137 09/04/2015 1340   NA 138 06/05/2015 1437   NA 136 02/05/2015 1339   K 3.9 09/04/2015 1340   K 3.7 06/05/2015 1437   K 3.7 02/05/2015 1339   CL 102  06/05/2015 1437   CL 101 02/05/2015 1339   CO2 24 09/04/2015 1340   CO2 22 06/05/2015 1437   CO2 25 02/05/2015 1339   BUN 23.4 09/04/2015 1340   BUN 18 06/05/2015 1437   BUN 20 02/05/2015 1339   CREATININE 1.2 09/04/2015 1340   CREATININE 1.14 06/05/2015 1437   CREATININE 0.8 02/05/2015 1339      Component Value Date/Time   CALCIUM 10.3 09/04/2015 1340   CALCIUM 10.2 06/05/2015 1437   CALCIUM 8.8 02/05/2015 1339   ALKPHOS 64 09/04/2015 1340   ALKPHOS 45 06/05/2015 1437   ALKPHOS 37 02/05/2015 1339   AST 16 09/04/2015 1340   AST 18 06/05/2015 1437   AST 21 02/05/2015 1339   ALT 9 09/04/2015 1340   ALT 9 06/05/2015 1437   ALT 45 02/05/2015 1339   BILITOT 0.51 09/04/2015 1340   BILITOT 0.6 06/05/2015 1437   BILITOT 0.80 02/05/2015 1339         Impression and Plan: Mr. James Byrd is 50 year old gentleman with a stage IIIB melanoma of the right hallux that was subungual. He had a microscopic positive inguinal lymph node.  He still is not back to feeling all that well.  No matter what would 4, we just cannot seem to find anything that we can try to fix to get him better. I will have to say that this is all from the [facility], it is truly a unique reaction.  I'm checking his iron levels.  We might have to get him to a neurologist to see if there is anything they might be able to. I don't know if he has any kind of neuropathy or any kind of myopathy that might be causing his problems.   I spent about 35 minutes with he and his wife. I just feel so bad for him area and I know he is trying his best.   I will like to see him back in about 6 weeks or so.    [name], MD 3/17/20175:08 PM

## 2015-09-07 ENCOUNTER — Other Ambulatory Visit: Payer: Self-pay | Admitting: Hematology & Oncology

## 2015-09-07 ENCOUNTER — Telehealth: Payer: Self-pay | Admitting: *Deleted

## 2015-09-07 DIAGNOSIS — R29898 Other symptoms and signs involving the musculoskeletal system: Secondary | ICD-10-CM

## 2015-09-07 LAB — IRON AND TIBC
%SAT: 28 % (ref 20–55)
Iron: 55 ug/dL (ref 42–163)
TIBC: 200 ug/dL — ABNORMAL LOW (ref 202–409)
UIBC: 145 ug/dL (ref 117–376)

## 2015-09-07 LAB — FERRITIN: Ferritin: 363 ng/ml — ABNORMAL HIGH (ref 22–316)

## 2015-09-07 NOTE — Telephone Encounter (Addendum)
Patient aware of results, including the iron studies which came back over the weekend and reviewed by Dr Marin Olp.  ----- Message from [name], MD sent at 09/07/2015  7:22 AM EDT ----- Call - so far the labs are normal!!  Other labs still pending!!  pete

## 2015-09-28 ENCOUNTER — Other Ambulatory Visit: Payer: Self-pay | Admitting: Nurse Practitioner

## 2015-09-28 DIAGNOSIS — C4371 Malignant melanoma of right lower limb, including hip: Secondary | ICD-10-CM

## 2015-09-28 DIAGNOSIS — R21 Rash and other nonspecific skin eruption: Secondary | ICD-10-CM

## 2015-09-28 MED ORDER — FAMOTIDINE 20 MG PO TABS: 20.0000 mg | ORAL_TABLET | Freq: Two times a day (BID) | ORAL | Status: DC

## 2015-09-28 MED ORDER — METHYLPREDNISOLONE 4 MG PO TBPK: ORAL_TABLET | Freq: Three times a day (TID) | ORAL | Status: DC

## 2015-09-28 NOTE — Telephone Encounter (Signed)
Patient called s/p Yervoy treatments. Per wife he has rash on both legs, raised, reddened. Denies any itching or discomfort. Per Dr. Marin Olp prescription sent to pharmacy and patient is aware to contact office with any futher complications.

## 2015-10-09 ENCOUNTER — Encounter: Payer: Self-pay | Admitting: Hematology & Oncology

## 2015-10-09 ENCOUNTER — Ambulatory Visit: Payer: BLUE CROSS/BLUE SHIELD | Admitting: Hematology & Oncology

## 2015-10-09 ENCOUNTER — Other Ambulatory Visit: Payer: BLUE CROSS/BLUE SHIELD

## 2015-10-09 ENCOUNTER — Encounter: Payer: Self-pay | Admitting: Nurse Practitioner

## 2015-10-09 ENCOUNTER — Other Ambulatory Visit (INDEPENDENT_AMBULATORY_CARE_PROVIDER_SITE_OTHER): Payer: BLUE CROSS/BLUE SHIELD

## 2015-10-09 ENCOUNTER — Ambulatory Visit (INDEPENDENT_AMBULATORY_CARE_PROVIDER_SITE_OTHER): Payer: BLUE CROSS/BLUE SHIELD | Admitting: Neurology

## 2015-10-09 ENCOUNTER — Ambulatory Visit: Payer: BLUE CROSS/BLUE SHIELD | Admitting: Neurology

## 2015-10-09 ENCOUNTER — Encounter: Payer: Self-pay | Admitting: Neurology

## 2015-10-09 VITALS — BP 100/68 | HR 64 | Ht 72.0 in | Wt 190.0 lb

## 2015-10-09 DIAGNOSIS — Z85820 Personal history of malignant melanoma of skin: Secondary | ICD-10-CM | POA: Diagnosis not present

## 2015-10-09 DIAGNOSIS — M6249 Contracture of muscle, multiple sites: Secondary | ICD-10-CM

## 2015-10-09 DIAGNOSIS — R292 Abnormal reflex: Secondary | ICD-10-CM | POA: Diagnosis not present

## 2015-10-09 DIAGNOSIS — R634 Abnormal weight loss: Secondary | ICD-10-CM

## 2015-10-09 DIAGNOSIS — R5381 Other malaise: Secondary | ICD-10-CM | POA: Diagnosis not present

## 2015-10-09 DIAGNOSIS — M62838 Other muscle spasm: Secondary | ICD-10-CM

## 2015-10-09 LAB — CREATININE, SERUM: CREATININE: 1.1 mg/dL (ref 0.40–1.50)

## 2015-10-09 LAB — VITAMIN B12: VITAMIN B 12: 601 pg/mL (ref 211–911)

## 2015-10-09 MED ORDER — BACLOFEN 10 MG PO TABS: ORAL_TABLET | ORAL | Status: DC

## 2015-10-09 NOTE — Progress Notes (Signed)
[facility] Clinic Note - Initial Visit   Date: 10/09/2015  James Byrd MRN: [id number] DOB: Nov 20, 1965   Dear Dr. Marin Olp:  Thank you for your kind referral of James Byrd for consultation of bilateral leg weakness. Although his history is well known to you, please allow [place] to reiterate it for the purpose of our medical record. The patient was accompanied to the clinic by wife who also provides collateral information.     History of Present Illness: James Byrd is a 50 y.o. right-handed Caucasian male with stage IIIB right great toe melanoma s/p amputation and adjuvant chemotheray, SVT, hyperlipidemia, depression, and GERD presenting for evaluation of progressive generalized weakness.    In 2015, he noticed discoloration of this right great toe nailbed which was later biopsied by podiatry and showed malignant melanoma. He went to [facility] where the surgeon amputated the great toe and pathology results showed sentinel lymph node was positive.  There was no evidence of metastatic disease on CT.  He was recommended to have adjuvant chemotherapy with Curt Bears, which he started in June 2016. Following his first cycle, he developed polyarthralgia with swelling which was managed with decadron.  He had a second cycle in July and soon developed depressive symptoms so started on Lexapro.  After the third dose, he was hospitalized for acute respiratory failure due to drug-induced pneurmonia and again for sepsis in August - September 2016.  Since this time, he has developed generalized weakness of the arms and legs.  He denies any low back pain, bowel/bladder dysfunction.  He also endorses numbness of the hands which started late fall 2016.  He is most bothered by leg stiffness.  He had great difficulty with standing up, especially after sitting for prolonged period of time.  His legs feel stiff, weak, and tired.  Since early 2017, he feels that his symptoms have  plateaued.  He has associated 70lb weight loss and endorses lack of appetite.  He spends all day sitting on the coach.  He does not eat meals unless his wife actively puts the food in front of him and only eats one meal per day.  They have cans of ensure at home, but he does not drink it.  He endorses very low mood and lack of motivation to do anything.  He used to enjoy gardening, but with his leg stiffness and weakness, he does not have energy to do anything.  Out-side paper records, electronic medical record, and images have been reviewed where available and summarized as:  Lab Results  Component Value Date   TROPONINI <0.03 03/07/2015   Lab Results  Component Value Date   TSH 2.751 07/24/2015   Lab Results  Component Value Date   VITAMINB12 917* 03/12/2015   MRI brain wwo contrast 06/16/2015:  Negative  MRI brain wwo contrast 03/10/2015:   1. No acute intracranial abnormality. 2. Extensive paranasal sinus mucosal disease, correlate clinically for acute sinusitis. 3. Left larger than right mastoid effusions.   Past Medical History  Diagnosis Date  . Paroxysmal supraventricular tachycardia ([place]) 05/24/2012    Described in the treadmill report; details are pending   . Anxiety 05/24/2012  . Malignant melanoma of right great toe ([place]) 11/14/2014  . HLD (hyperlipidemia)   . Chronic diastolic congestive heart failure [facility])     Past Surgical History  Procedure Laterality Date  . Rotator cuff repair  2010    3 yrs ago  . Rotator cuff repair    . Ablation    .  R toe partial ambutation       Medications:  Outpatient Encounter Prescriptions as of 10/09/2015  Medication Sig Note  . Ascorbic Acid (VITAMIN C) 1000 MG tablet Take 1,000 mg by mouth daily.   Marland Kitchen aspirin 81 MG tablet Take 81 mg by mouth daily.   Marland Kitchen augmented betamethasone dipropionate (DIPROLENE-AF) 0.05 % cream  10/09/2015: Received from: External Pharmacy  . Cholecalciferol (VITAMIN D) 2000 units tablet Take 2,000 Units  by mouth daily.   Marland Kitchen DILT-XR 120 MG 24 hr capsule  10/09/2015: Received from: External Pharmacy  . diltiazem (TIAZAC) 120 MG 24 hr capsule Take 120 mg by mouth daily.   Marland Kitchen dronabinol (MARINOL) 5 MG capsule Take 1 capsule (5 mg total) by mouth 2 (two) times daily before a meal.   . escitalopram (LEXAPRO) 10 MG tablet Take 1 tablet (10 mg total) by mouth daily. (Patient taking differently: Take 5 mg by mouth daily. )   . famotidine (PEPCID) 20 MG tablet Take 1 tablet (20 mg total) by mouth 2 (two) times daily.   . methylphenidate (RITALIN) 10 MG tablet Take 1 tablet (10 mg total) by mouth 2 (two) times daily.   . Multiple Vitamin (ONE-A-DAY MENS PO) Take by mouth.   . Multiple Vitamins-Minerals (MENS ONE DAILY PO) Take 1 tablet by mouth daily.    . pantoprazole (PROTONIX) 40 MG tablet Take 1 tablet (40 mg total) by mouth at bedtime.   . pravastatin (PRAVACHOL) 40 MG tablet  09/04/2015: Received from: External Pharmacy  . ranitidine (ZANTAC) 150 MG tablet  10/09/2015: Received from: External Pharmacy  . vitamin B-12 (CYANOCOBALAMIN) 1000 MCG tablet Take 1,000 mcg by mouth daily.   . baclofen (LIORESAL) 10 MG tablet Take half-tablet at bedtime for 3 days, then increase to half-tablet twice daily.   . [DISCONTINUED] methylPREDNISolone (MEDROL DOSEPAK) 4 MG TBPK tablet Take by mouth 3 x daily with food. Take as directed.    Facility-Administered Encounter Medications as of 10/09/2015  Medication  . 0.9 %  sodium chloride infusion     Allergies: No Known Allergies  Family History: Family History  Problem Relation Age of Onset  . Stroke Mother   . COPD Father   . Diabetes Father   . Diabetes Sister   . Diabetes Brother     Social History: Social History  Substance Use Topics  . Smoking status: Never Smoker   . Smokeless tobacco: Never Used  . Alcohol Use: No   Social History   Social History Narrative   Lives with wife in a one story home.  Has 2 children.  On disability.  Education:  high school.+    Review of Systems:  CONSTITUTIONAL: No fevers, chills, night sweats + 90lb weight loss.   EYES: No visual changes or eye pain ENT: No hearing changes.  No history of nose bleeds.   RESPIRATORY: No cough, wheezing and shortness of breath.   CARDIOVASCULAR: Negative for chest pain, and palpitations.   GI: Negative for abdominal discomfort, blood in stools or black stools.  No recent change in bowel habits.   GU:  No history of incontinence.   MUSCLOSKELETAL: No history of joint pain or swelling.  No myalgias.   SKIN: Negative for lesions, rash, and itching.   HEMATOLOGY/ONCOLOGY: Negative for prolonged bleeding, bruising easily, and swollen nodes.  +history of cancer.   ENDOCRINE: Negative for cold or heat intolerance, polydipsia or goiter.   PSYCH:  ++depression or anxiety symptoms.   NEURO: As Above.  Vital Signs:  BP 100/68 mmHg  Pulse 64  Ht 6' (1.829 m)  Wt 190 lb (86.183 kg)  BMI 25.76 kg/m2  SpO2 97%   General Medical Exam:   General:  Pale, very depressed appearing.   Eyes/ENT: see cranial nerve examination.   Neck: No masses appreciated.  Full range of motion without tenderness.  No carotid bruits. Respiratory:  Clear to auscultation, good air entry bilaterally.   Cardiac:  Regular rate and rhythm, no murmur.   Extremities:  Right great toe with amputated at DIP.    Skin:  Macular erythematous rash over the knees bilaterally.    Neurological Exam: MENTAL STATUS including orientation to time, place, person, recent and remote memory, attention span and concentration, language, and fund of knowledge is normal.  Speech is not dysarthric.  CRANIAL NERVES: II:  No visual field defects.  Unremarkable fundi.   III-IV-VI: Pupils equal round and reactive to light.  Normal conjugate, extra-ocular eye movements in all directions of gaze.  No nystagmus.  No ptosis at rest or with sustained upward gaze.   V:  Normal facial sensation.     VII:  Normal facial  symmetry and movements. Marland Kitchen  VIII:  Normal hearing and vestibular function.   IX-X:  Normal palatal movement.   XI:  Normal shoulder shrug and head rotation.   XII:  Normal tongue strength and range of motion, no deviation or fasciculation.  MOTOR:  No atrophy, fasciculations or abnormal movements.  No pronator drift.  There is increased tone in the lower extremities with spastic catch.  There is no fatigability of proximal muscles.   Right Upper Extremity:    Left Upper Extremity:    Deltoid  5/5   Deltoid  5/5   Biceps  5/5   Biceps  5/5   Triceps  5/5   Triceps  5/5   Wrist extensors  5/5   Wrist extensors  5/5   Wrist flexors  5/5   Wrist flexors  5/5   Finger extensors  5/5   Finger extensors  5/5   Finger flexors  5/5   Finger flexors  5/5   Dorsal interossei  5/5   Dorsal interossei  5/5   Abductor pollicis  5/5   Abductor pollicis  5/5   Tone (Ashworth scale)  0  Tone (Ashworth scale)  0   Right Lower Extremity:    Left Lower Extremity:    Hip flexors  5/5   Hip flexors  5/5   Hip extensors  5/5   Hip extensors  5/5   Knee flexors  5/5   Knee flexors  5/5   Knee extensors  5/5   Knee extensors  5/5   Dorsiflexors  4/5   Dorsiflexors  4/5   Plantarflexors  5/5   Plantarflexors  5/5   Toe extensors  4/5   Toe extensors  4/5   Toe flexors  4/5   Toe flexors  4/5   Tone (Ashworth scale)  0  Tone (Ashworth scale)  0   MSRs:  Right                                                                 Left brachioradialis 2+  brachioradialis 2+  biceps 2+  biceps 2+  triceps 2+  triceps 2+  patellar 3+  patellar 3+  ankle jerk 3+  ankle jerk 3+  Hoffman no  Hoffman no  plantar response up  plantar response up  1-2 beat ankle clonus bilaterally  SENSORY:  Normal and symmetric perception of light touch, pinprick, vibration, and proprioception.    COORDINATION/GAIT: Normal finger-to- nose-finger and heel-to-shin. Slowed toe tapping bilaterally. Unable to rise from a chair without  using arms.  Gait is slow, spastic appearing and unsteady at times.      IMPRESSION: 1.  Bilateral leg weakness and stiffness due to myelopathy  - MRI lumbar spine wwo contrast will be ordered to look for structural lesion of the spinal cord  - If lumbar spine does not show abnormality, will need to look at MRI thoracic and cervical spine next  - Check myelopathic labs: vitamin B12, B1, copper, zinc, ceruloplasmin, vitamin E  - MRI brain reviewed and does not show abnormalities  - Start baclofen 5mg  twice daily  - Start physical therapy for leg stretching  - Fall precautions discussed  - Recommend using a cane  2.  Generalized weakness and malaise is most likely due to combination of malnutrition, deconditioning, depression, and chemotherapy  - No signs of myopathy on exam  - No signs of fatigability to suggest neuromuscular junction disorder, but since there are case reports of myasthenia, I will screen him for myasthenia gravis antibodies  - He has marked weight loss of > 90lb and I would not be surprised if there was nutrient deficiency, I have encouraged him to add ensure to supplement meals   3.  History of melanoma affecting the right hallux s/p amputation and adjuvant therapy with Curt Bears  - Literature search shows case reports of associated myasthenia, axonal motor neuropathy, and GBS-like syndrome with Curt Bears, but Mr. Wesemann has more upper motor neuron signs concerning for myelopathy  - With this said, neuropathy cannot be excluded especially with his feet weakness. I may consider NCS/EMG going forward, pending his imaging results  3.  Depression on Lexapro 10mg  but complains of increase sedation  - He endorses anhedonia, lack of appetite, sleep disturbance, slowed processing, lack of motivation  - He has severe depression which may also be contributing to his generalized malaise.    - He may benefit from seeing a counselor, but I will defer this to his PCP/oncologist to discuss  further  Return to clinic in 1-2 months.   The duration of this appointment visit was 60 minutes of face-to-face time with the patient.  Greater than 50% of this time was spent in counseling, explanation of diagnosis, planning of further management, and coordination of care.   Thank you for allowing me to participate in patient's care.  If I can answer any additional questions, I would be pleased to do so.    Sincerely,    Fayrene Towner K. Posey Pronto, DO

## 2015-10-09 NOTE — Patient Instructions (Addendum)
1.  Check blood work 2.  MRI lumbar spine wwo contrast 3.  Start baclofen 5mg  (half-tablet) twice daily 4.  Start doing leg stretches to loosen your leg muscles  5.  Start starting physical therapy for leg stretching  Return to clinic in 1-2 months

## 2015-10-12 LAB — ZINC: ZINC: 89 ug/dL (ref 60–130)

## 2015-10-12 LAB — CERULOPLASMIN: CERULOPLASMIN: 25 mg/dL (ref 18–36)

## 2015-10-13 ENCOUNTER — Telehealth: Payer: Self-pay | Admitting: *Deleted

## 2015-10-13 ENCOUNTER — Other Ambulatory Visit: Payer: Self-pay | Admitting: *Deleted

## 2015-10-13 DIAGNOSIS — R292 Abnormal reflex: Secondary | ICD-10-CM

## 2015-10-13 DIAGNOSIS — M62838 Other muscle spasm: Secondary | ICD-10-CM

## 2015-10-13 NOTE — Telephone Encounter (Signed)
Called patient and left message that his MRI is scheduled for April 27 at 3:45.

## 2015-10-14 LAB — VITAMIN E

## 2015-10-14 LAB — VITAMIN B1: VITAMIN B1 (THIAMINE): 11 nmol/L (ref 8–30)

## 2015-10-14 LAB — COPPER, SERUM: COPPER: 98 ug/dL (ref 72–166)

## 2015-10-16 ENCOUNTER — Ambulatory Visit (HOSPITAL_BASED_OUTPATIENT_CLINIC_OR_DEPARTMENT_OTHER): Payer: BLUE CROSS/BLUE SHIELD | Admitting: Hematology & Oncology

## 2015-10-16 ENCOUNTER — Encounter: Payer: Self-pay | Admitting: Hematology & Oncology

## 2015-10-16 ENCOUNTER — Other Ambulatory Visit (HOSPITAL_BASED_OUTPATIENT_CLINIC_OR_DEPARTMENT_OTHER): Payer: BLUE CROSS/BLUE SHIELD

## 2015-10-16 VITALS — BP 95/61 | HR 91 | Temp 98.0°F | Resp 16 | Ht 72.0 in | Wt 190.0 lb

## 2015-10-16 DIAGNOSIS — R634 Abnormal weight loss: Secondary | ICD-10-CM

## 2015-10-16 DIAGNOSIS — C4371 Malignant melanoma of right lower limb, including hip: Secondary | ICD-10-CM

## 2015-10-16 DIAGNOSIS — M810 Age-related osteoporosis without current pathological fracture: Secondary | ICD-10-CM

## 2015-10-16 DIAGNOSIS — G47429 Narcolepsy in conditions classified elsewhere without cataplexy: Secondary | ICD-10-CM

## 2015-10-16 DIAGNOSIS — G722 Myopathy due to other toxic agents: Secondary | ICD-10-CM

## 2015-10-16 DIAGNOSIS — D508 Other iron deficiency anemias: Secondary | ICD-10-CM

## 2015-10-16 DIAGNOSIS — R5383 Other fatigue: Secondary | ICD-10-CM | POA: Diagnosis not present

## 2015-10-16 LAB — CBC WITH DIFFERENTIAL (CANCER CENTER ONLY)
BASO#: 0 10*3/uL (ref 0.0–0.2)
BASO%: 0.2 % (ref 0.0–2.0)
EOS ABS: 0.2 10*3/uL (ref 0.0–0.5)
EOS%: 3.3 % (ref 0.0–7.0)
HEMATOCRIT: 30.9 % — AB (ref 38.7–49.9)
HEMOGLOBIN: 10.8 g/dL — AB (ref 13.0–17.1)
LYMPH#: 2.1 10*3/uL (ref 0.9–3.3)
LYMPH%: 42.1 % (ref 14.0–48.0)
MCH: 29.6 pg (ref 28.0–33.4)
MCHC: 35 g/dL (ref 32.0–35.9)
MCV: 85 fL (ref 82–98)
MONO#: 0.4 10*3/uL (ref 0.1–0.9)
MONO%: 7.1 % (ref 0.0–13.0)
NEUT%: 47.3 % (ref 40.0–80.0)
NEUTROS ABS: 2.3 10*3/uL (ref 1.5–6.5)
Platelets: 179 10*3/uL (ref 145–400)
RBC: 3.65 10*6/uL — ABNORMAL LOW (ref 4.20–5.70)
RDW: 13.7 % (ref 11.1–15.7)
WBC: 4.9 10*3/uL (ref 4.0–10.0)

## 2015-10-16 LAB — COMPREHENSIVE METABOLIC PANEL
ALBUMIN: 3.9 g/dL (ref 3.5–5.0)
ALT: 11 U/L (ref 0–55)
AST: 16 U/L (ref 5–34)
Alkaline Phosphatase: 62 U/L (ref 40–150)
Anion Gap: 8 mEq/L (ref 3–11)
BILIRUBIN TOTAL: 0.49 mg/dL (ref 0.20–1.20)
BUN: 20 mg/dL (ref 7.0–26.0)
CO2: 21 meq/L — AB (ref 22–29)
Calcium: 9.7 mg/dL (ref 8.4–10.4)
Chloride: 111 mEq/L — ABNORMAL HIGH (ref 98–109)
Creatinine: 1 mg/dL (ref 0.7–1.3)
GLUCOSE: 81 mg/dL (ref 70–140)
Potassium: 3.9 mEq/L (ref 3.5–5.1)
SODIUM: 141 meq/L (ref 136–145)
TOTAL PROTEIN: 6.8 g/dL (ref 6.4–8.3)

## 2015-10-16 LAB — MYASTHENIA GRAVIS PANEL 2
Acetylcholine Rec Mod Ab: 12 % binding inhibition
Aceytlcholine Rec Bloc Ab: <15 % of inhibition (ref <15)

## 2015-10-16 LAB — LACTATE DEHYDROGENASE: LDH: 123 U/L — ABNORMAL LOW (ref 125–245)

## 2015-10-16 NOTE — Progress Notes (Signed)
Hematology and Oncology Follow Up Visit  James Byrd [id number] Feb 09, 1966 50 y.o. 10/16/2015   Principle Diagnosis:   Stage IIIB [id number] subungual melanoma of the right hallux  Current Therapy:    Yervoy-status post 3 cycles. Discontinued secondary to toxicity.     Interim History:  James Byrd is back for follow-up. He is looking about the same. He is still lost some weight. He probably has lost 70 pounds since we first started seeing him. He just does not have much of a taste for food.  He is still incredibly fatigued. He has a lot of arthralgias and myalgias. It is hard for him to walk. He just is so stiff.  I will see if some Ritalin might help. I will note this might be able to give him a little bit more energy.  His labs have never look that bad. His testosterone is fantastic. I am checking an iron level on him.  His last scans were done in October. They all looked okay.  So far, this been no evidence of any recurrence of his melanoma. He has not had any rashes. He's had no leg swelling. He's had no diarrhea. He's had no constipation. He's had no cough. He's had no fever. He's had no bleeding. He's had no mouth sores. He's had no headache.  Currently, his performance status is ECOG 2   Medications:  Current outpatient prescriptions:  .  Ascorbic Acid (VITAMIN C) 1000 MG tablet, Take 1,000 mg by mouth daily., Disp: , Rfl:  .  aspirin 81 MG tablet, Take 81 mg by mouth daily., Disp: , Rfl:  .  augmented betamethasone dipropionate (DIPROLENE-AF) 0.05 % cream, , Disp: , Rfl:  .  baclofen (LIORESAL) 10 MG tablet, Take half-tablet at bedtime for 3 days, then increase to half-tablet twice daily., Disp: 30 each, Rfl: 3 .  buPROPion (WELLBUTRIN SR) 100 MG 12 hr tablet, Take 100 mg by mouth 2 (two) times daily., Disp: , Rfl:  .  Cholecalciferol (VITAMIN D) 2000 units tablet, Take 2,000 Units by mouth daily., Disp: , Rfl:  .  DILT-XR 120 MG 24 hr capsule, , Disp: , Rfl:  .   escitalopram (LEXAPRO) 10 MG tablet, Take 1 tablet (10 mg total) by mouth daily. (Patient taking differently: Take 5 mg by mouth daily. ), Disp: 90 tablet, Rfl: 3 .  mirtazapine (REMERON) 15 MG tablet, Take 15 mg by mouth at bedtime., Disp: , Rfl:  .  Multiple Vitamins-Minerals (MENS ONE DAILY PO), Take 1 tablet by mouth daily. , Disp: , Rfl:  .  pantoprazole (PROTONIX) 40 MG tablet, Take 1 tablet (40 mg total) by mouth at bedtime., Disp: 30 tablet, Rfl: 0 .  vitamin B-12 (CYANOCOBALAMIN) 1000 MCG tablet, Take 1,000 mcg by mouth daily., Disp: , Rfl:  No current facility-administered medications for this visit.  Facility-Administered Medications Ordered in Other Visits:  .  0.9 %  sodium chloride infusion, , Intravenous, Continuous, [name], MD, Last Rate: 500 mL/hr at 02/05/15 1510  Allergies: No Known Allergies  Past Medical History, Surgical history, Social history, and Family History were reviewed and updated.  Review of Systems: As above  Physical Exam:  height is 6' (1.829 m) and weight is 190 lb (86.183 kg). His oral temperature is 98 F (36.7 C). His blood pressure is 95/61 and his pulse is 91. His respiration is 16.   Wt Readings from Last 3 Encounters:  10/16/15 190 lb (86.183 kg)  10/09/15 190 lb (86.183 kg)  09/04/15 198 lb (89.812 kg)     Well developed and well nourished white male. Head and neck exam shows no ocular or oral lesions. There might be some slight injectable inflammation, more so on the left eye. He has no adenopathy in the neck. Lungs are clear. He has no rales, wheezes or rhonchi. Cardiac exam regular rate and rhythm with no murmurs, rubs or bruits. Abdomen is soft. He has good bowel sounds. There is no fluid wave. There is no palpable liver or spleen tip. Back exam shows no tenderness over the spine, ribs or hips. Extremities shows the amputated big toe of the right foot. There is no swelling in the right leg. Skin exam shows no rashes or ecchymosis or  petechia. Neurological exam is nonfocal.  Lab Results  Component Value Date   WBC 4.9 10/16/2015   HGB 10.8* 10/16/2015   HCT 30.9* 10/16/2015   MCV 85 10/16/2015   PLT 179 10/16/2015     Chemistry      Component Value Date/Time   NA 141 10/16/2015 1334   NA 138 06/05/2015 1437   NA 136 02/05/2015 1339   K 3.9 10/16/2015 1334   K 3.7 06/05/2015 1437   K 3.7 02/05/2015 1339   CL 102 06/05/2015 1437   CL 101 02/05/2015 1339   CO2 21* 10/16/2015 1334   CO2 22 06/05/2015 1437   CO2 25 02/05/2015 1339   BUN 20.0 10/16/2015 1334   BUN 18 06/05/2015 1437   BUN 20 02/05/2015 1339   CREATININE 1.0 10/16/2015 1334   CREATININE 1.10 10/09/2015 1324   CREATININE 0.8 02/05/2015 1339      Component Value Date/Time   CALCIUM 9.7 10/16/2015 1334   CALCIUM 10.2 06/05/2015 1437   CALCIUM 8.8 02/05/2015 1339   ALKPHOS 62 10/16/2015 1334   ALKPHOS 45 06/05/2015 1437   ALKPHOS 37 02/05/2015 1339   AST 16 10/16/2015 1334   AST 18 06/05/2015 1437   AST 21 02/05/2015 1339   ALT 11 10/16/2015 1334   ALT 9 06/05/2015 1437   ALT 45 02/05/2015 1339   BILITOT 0.49 10/16/2015 1334   BILITOT 0.6 06/05/2015 1437   BILITOT 0.80 02/05/2015 1339         Impression and Plan: James Byrd is 50 year old gentleman with a stage IIIB melanoma of the right hallux that was subungual. He had a microscopic positive inguinal lymph node.  He still is not back to feeling all that well.  Many the neurologist will be able to help [place] out. We will have to see what the MRI shows that he had done.  I would have to think that he might need a nerve or muscle biopsy to see exactly what is going on. I just wonder if he has some type of autoimmune neuropathy from the [facility]..  I would think that if this were the case, a biopsy which show lymphocyte infiltration amongst the neuro muscular bundles.  I just really feel pad that he is just not getting better. I really don't think that his melanoma is going to be a  problem. However, we do have to follow-up in 6 weeks or so for another CT scan to evaluate for recurrence. He is at significant risk for recurrence.   I spent about 30 minutes or so with he and his wife. I know his faith is very strong.   [name], MD 4/28/20175:47 PM

## 2015-10-17 LAB — RETICULOCYTES: Reticulocyte Count: 0.5 % — ABNORMAL LOW (ref 0.6–2.6)

## 2015-10-17 LAB — VITAMIN D 25 HYDROXY (VIT D DEFICIENCY, FRACTURES): Vitamin D, 25-Hydroxy: 37.1 ng/mL (ref 30.0–100.0)

## 2015-10-19 LAB — TSH: TSH: 2.732 m(IU)/L (ref 0.320–4.118)

## 2015-10-20 ENCOUNTER — Telehealth: Payer: Self-pay | Admitting: Neurology

## 2015-10-20 NOTE — Telephone Encounter (Signed)
Left message for patient to call me back. 

## 2015-10-20 NOTE — Telephone Encounter (Signed)
MRI lumbar spine wwo contrast 10/16/2015:  1.  No areas of metastatic disease of the lumbar spine. 2.  At L4-5 there is mild broad-based disc bulge with right lateral disc protrusion abutting the right extraforaminal L4 nerve root.  Bilateral lateral recess stenosis.  Mild bilateral facet arthropathy.   Caryl Pina, please call and inform the patient that there is mild degenerative changes with slight disc herniation in the low back region, but findings are too mild to explain his leg stiffness.  I would like to proceed with MRI cervical and thoracic spine with and without contrast to further evaluation his myelopathy.  Bannon Giammarco K. Posey Pronto, DO

## 2015-10-22 NOTE — Telephone Encounter (Signed)
I called patient's wife and she said that the MRIs cost $500 out of pocket each time.  Anything else we can do?

## 2015-10-22 NOTE — Telephone Encounter (Signed)
This is unfortunate and I can understand how expensive medical tests can be.  I would like to better understand his leg stiffness and imaging is the only way.  CT can be cheaper but will not give enough information of the spinal cord.  We can do stepwise approach with MRI thoracic spine first.  NCS/EMG will look for weakness and neuropathy, but does not assess for myelopathy (stiffness), but we can certainly do this to exclude other possibilities.  Donika K. Posey Pronto, DO

## 2015-10-23 NOTE — Telephone Encounter (Signed)
I spoke with Hoyle Sauer and she said that she will talk to her husband and call me back.

## 2015-10-27 ENCOUNTER — Telehealth: Payer: Self-pay | Admitting: Neurology

## 2015-10-27 NOTE — Telephone Encounter (Signed)
His wife called.  I will schedule him for [facility].

## 2015-10-27 NOTE — Telephone Encounter (Signed)
Noted. Thanks.

## 2015-10-27 NOTE — Telephone Encounter (Signed)
James Byrd 03-10-2066. His wife James Byrd called regarding his MRI. They were wondering if they could go to [facility]? His number is [phone]. Thank you

## 2015-10-28 ENCOUNTER — Other Ambulatory Visit: Payer: Self-pay | Admitting: *Deleted

## 2015-10-28 DIAGNOSIS — R634 Abnormal weight loss: Secondary | ICD-10-CM

## 2015-10-28 DIAGNOSIS — R292 Abnormal reflex: Secondary | ICD-10-CM

## 2015-10-28 DIAGNOSIS — M62838 Other muscle spasm: Secondary | ICD-10-CM

## 2015-10-28 NOTE — Telephone Encounter (Signed)
Appointment scheduled for May 11 at 1:30.  [place] notified.

## 2015-10-28 NOTE — Telephone Encounter (Signed)
MRI is scheduled for tomorrow, May 11 at 1:30.  Patient's wife notified.

## 2015-10-30 ENCOUNTER — Telehealth: Payer: Self-pay | Admitting: Neurology

## 2015-10-30 NOTE — Telephone Encounter (Signed)
I spoke with patient's wife and she said that they do not want to do another MRI because of the cost.  Patient is in PT now but does not think it is helping.  They are going to start him on H2O therapy next which I let her know is really good therapy.  She will let us know if they change their mind about the MRI.

## 2015-10-30 NOTE — Telephone Encounter (Signed)
MRI thoracic spine wwo contrast 10/29/2015:  Normal.  Caryl Pina, please inform patient that his MRI of the mid-back is normal.  I would still recommend MRI cervical spine wwo contrast, if they are willing.  This is the only other imaging study I would recommend.   Donika K. Posey Pronto, DO

## 2015-10-30 NOTE — Telephone Encounter (Signed)
Noted  

## 2015-11-09 ENCOUNTER — Telehealth: Payer: Self-pay | Admitting: *Deleted

## 2015-11-09 ENCOUNTER — Other Ambulatory Visit: Payer: Self-pay | Admitting: Family

## 2015-11-09 ENCOUNTER — Other Ambulatory Visit: Payer: Self-pay | Admitting: *Deleted

## 2015-11-09 ENCOUNTER — Telehealth: Payer: Self-pay | Admitting: Neurology

## 2015-11-09 DIAGNOSIS — R634 Abnormal weight loss: Secondary | ICD-10-CM

## 2015-11-09 DIAGNOSIS — R5381 Other malaise: Secondary | ICD-10-CM

## 2015-11-09 DIAGNOSIS — M62838 Other muscle spasm: Secondary | ICD-10-CM

## 2015-11-09 DIAGNOSIS — C4371 Malignant melanoma of right lower limb, including hip: Secondary | ICD-10-CM

## 2015-11-09 DIAGNOSIS — R292 Abnormal reflex: Secondary | ICD-10-CM

## 2015-11-09 NOTE — Telephone Encounter (Signed)
Patient's wife calling to see if appointment with Dr Marin Olp can be moved up from June 9th. She states he is still doing poorly. He has continued weight loss with nausea and decreased appetite. He's having more loose/slimy stools. His weight is down to 181lbs.  Spoke with Dr Marin Olp. He along with the scheduler will work on getting patient in sooner and move his currently ordered scans to [facility]. Wife is aware. Scheduler will contact her after all appropriate appointments made.

## 2015-11-09 NOTE — Telephone Encounter (Signed)
PT's wife Hoyle Sauer called and needs a call back/Dawn CB# [phone]

## 2015-11-09 NOTE — Telephone Encounter (Signed)
I spoke with James Byrd and she said that she is double covered with insurance and they would like to do the other MRI.  Please advise.

## 2015-11-09 NOTE — Telephone Encounter (Signed)
Patient's wife notified that the MRI is scheduled for Wed. May 24 at 1:30.

## 2015-11-09 NOTE — Telephone Encounter (Signed)
Please order MRI cervical spine wwo contrast.  Thanks.

## 2015-11-10 ENCOUNTER — Telehealth: Payer: Self-pay | Admitting: *Deleted

## 2015-11-10 NOTE — Telephone Encounter (Signed)
PA #: [id number]

## 2015-11-12 ENCOUNTER — Encounter: Payer: Self-pay | Admitting: Hematology & Oncology

## 2015-11-15 ENCOUNTER — Emergency Department (HOSPITAL_COMMUNITY)
Admission: EM | Admit: 2015-11-15 | Discharge: 2015-11-15 | Disposition: A | Discharge status: Home / Self Care | Payer: BLUE CROSS/BLUE SHIELD | Attending: Emergency Medicine | Admitting: Emergency Medicine

## 2015-11-15 ENCOUNTER — Encounter (HOSPITAL_COMMUNITY): Payer: Self-pay

## 2015-11-15 DIAGNOSIS — E785 Hyperlipidemia, unspecified: Secondary | ICD-10-CM | POA: Insufficient documentation

## 2015-11-15 DIAGNOSIS — I5032 Chronic diastolic (congestive) heart failure: Secondary | ICD-10-CM | POA: Diagnosis not present

## 2015-11-15 DIAGNOSIS — R112 Nausea with vomiting, unspecified: Secondary | ICD-10-CM | POA: Insufficient documentation

## 2015-11-15 DIAGNOSIS — R197 Diarrhea, unspecified: Secondary | ICD-10-CM | POA: Diagnosis not present

## 2015-11-15 DIAGNOSIS — Z7982 Long term (current) use of aspirin: Secondary | ICD-10-CM | POA: Insufficient documentation

## 2015-11-15 DIAGNOSIS — Z79899 Other long term (current) drug therapy: Secondary | ICD-10-CM | POA: Insufficient documentation

## 2015-11-15 LAB — COMPREHENSIVE METABOLIC PANEL
ALBUMIN: 3.9 g/dL (ref 3.5–5.0)
ALT: 9 U/L — AB (ref 17–63)
AST: 16 U/L (ref 15–41)
Alkaline Phosphatase: 66 U/L (ref 38–126)
Anion gap: 9 (ref 5–15)
BUN: 14 mg/dL (ref 6–20)
CHLORIDE: 107 mmol/L (ref 101–111)
CO2: 19 mmol/L — AB (ref 22–32)
CREATININE: 1.06 mg/dL (ref 0.61–1.24)
Calcium: 9.9 mg/dL (ref 8.9–10.3)
GFR calc non Af Amer: >60 mL/min (ref >60)
GLUCOSE: 78 mg/dL (ref 65–99)
Potassium: 3.2 mmol/L — ABNORMAL LOW (ref 3.5–5.1)
SODIUM: 135 mmol/L (ref 135–145)
Total Bilirubin: 0.9 mg/dL (ref 0.3–1.2)
Total Protein: 7.1 g/dL (ref 6.5–8.1)

## 2015-11-15 LAB — URINALYSIS, ROUTINE W REFLEX MICROSCOPIC
Bilirubin Urine: NEGATIVE
GLUCOSE, UA: NEGATIVE mg/dL
HGB URINE DIPSTICK: NEGATIVE
Ketones, ur: 15 mg/dL — AB
Leukocytes, UA: NEGATIVE
Nitrite: NEGATIVE
PROTEIN: NEGATIVE mg/dL
SPECIFIC GRAVITY, URINE: 1.027 (ref 1.005–1.030)
pH: 5.5 (ref 5.0–8.0)

## 2015-11-15 LAB — CBC
HCT: 33.4 % — ABNORMAL LOW (ref 39.0–52.0)
Hemoglobin: 11.1 g/dL — ABNORMAL LOW (ref 13.0–17.0)
MCH: 27.9 pg (ref 26.0–34.0)
MCHC: 33.2 g/dL (ref 30.0–36.0)
MCV: 83.9 fL (ref 78.0–100.0)
PLATELETS: 236 10*3/uL (ref 150–400)
RBC: 3.98 MIL/uL — AB (ref 4.22–5.81)
RDW: 13.8 % (ref 11.5–15.5)
WBC: 7 10*3/uL (ref 4.0–10.5)

## 2015-11-15 LAB — LIPASE, BLOOD: LIPASE: 38 U/L (ref 11–51)

## 2015-11-15 MED ORDER — ONDANSETRON 4 MG PO TBDP
ORAL_TABLET | ORAL | Status: AC
  Filled 2015-11-15: qty 1

## 2015-11-15 MED ORDER — ACETAMINOPHEN 325 MG PO TABS: 650.0000 mg | ORAL_TABLET | Freq: Once | ORAL | Status: DC

## 2015-11-15 MED ORDER — ONDANSETRON 4 MG PO TBDP
4.0000 mg | ORAL_TABLET | Freq: Once | ORAL | Status: AC | PRN
  Administered 2015-11-15: 4 mg via ORAL

## 2015-11-15 MED ORDER — ONDANSETRON 4 MG PO TBDP: 4.0000 mg | ORAL_TABLET | Freq: Three times a day (TID) | ORAL | Status: DC | PRN

## 2015-11-15 NOTE — ED Provider Notes (Signed)
CSN: [id number]     Arrival date & time 11/15/15  1532 History   First MD Initiated Contact with Patient 11/15/15 1709     Chief Complaint  Patient presents with  . Emesis     (Consider location/radiation/quality/duration/timing/severity/associated sxs/prior Treatment) HPI Comments: Patient presents today with a chief complaint of nausea and vomiting.  Symptoms have been reoccurring weekly since he completed chemo for his melanoma of his toe in August of 2016.  He states that this episode of vomiting and diarrhea began 3-4 days ago.  He reports that he has been having two episodes of vomiting and two episodes of diarrhea daily.  He has not taken anything for symptoms prior to arrival.  No blood in his emesis or blood in his stool.  He denies any abdominal pain, fever, chills, chest pain, urinary symptoms, or any other symptoms at this time.  He reports that he has seen a GI physician in [place] for these symptoms and was told that his "stomach is more sensitive due to being on chemo in the past."  He reports that he has had both an Endoscopy and Colonoscopy for these symptoms and no cause was identified.  Patient is a 50 y.o. male presenting with vomiting. The history is provided by the patient.  Emesis   Past Medical History  Diagnosis Date  . Paroxysmal supraventricular tachycardia ([place]) 05/24/2012    Described in the treadmill report; details are pending   . Anxiety 05/24/2012  . Malignant melanoma of right great toe ([place]) 11/14/2014  . HLD (hyperlipidemia)   . Chronic diastolic congestive heart failure [facility])    Past Surgical History  Procedure Laterality Date  . Rotator cuff repair  2010    3 yrs ago  . Rotator cuff repair    . Ablation    . R toe partial ambutation     Family History  Problem Relation Age of Onset  . Stroke Mother   . COPD Father   . Diabetes Father   . Diabetes Sister   . Diabetes Brother    Social History  Substance Use Topics  . Smoking status: Never  Smoker   . Smokeless tobacco: Never Used  . Alcohol Use: No    Review of Systems  Gastrointestinal: Positive for vomiting.  All other systems reviewed and are negative.     Allergies  Review of patient's allergies indicates no known allergies.  Home Medications   Prior to Admission medications   Medication Sig Start Date End Date Taking? Authorizing Provider  Ascorbic Acid (VITAMIN C) 1000 MG tablet Take 1,000 mg by mouth daily.    Historical Provider, MD  aspirin 81 MG tablet Take 81 mg by mouth daily.    Historical Provider, MD  augmented betamethasone dipropionate (DIPROLENE-AF) 0.05 % cream  09/29/15   Historical Provider, MD  baclofen (LIORESAL) 10 MG tablet Take half-tablet at bedtime for 3 days, then increase to half-tablet twice daily. 10/09/15   Donika K Patel, DO  buPROPion (WELLBUTRIN SR) 100 MG 12 hr tablet Take 100 mg by mouth 2 (two) times daily.    Historical Provider, MD  Cholecalciferol (VITAMIN D) 2000 units tablet Take 2,000 Units by mouth daily.    Historical Provider, MD  DILT-XR 120 MG 24 hr capsule  09/23/15   Historical Provider, MD  escitalopram (LEXAPRO) 10 MG tablet Take 1 tablet (10 mg total) by mouth daily. Patient taking differently: Take 5 mg by mouth daily.  07/20/15   [name],  MD  mirtazapine (REMERON) 15 MG tablet Take 15 mg by mouth at bedtime.    Historical Provider, MD  Multiple Vitamins-Minerals (MENS ONE DAILY PO) Take 1 tablet by mouth daily.     Historical Provider, MD  pantoprazole (PROTONIX) 40 MG tablet Take 1 tablet (40 mg total) by mouth at bedtime. 03/16/15   Cherene Altes, MD  vitamin B-12 (CYANOCOBALAMIN) 1000 MCG tablet Take 1,000 mcg by mouth daily.    Historical Provider, MD   BP 98/72 mmHg  Pulse 74  Temp(Src) 98.5 F (36.9 C) (Oral)  Resp 16  Ht 6' (1.829 m)  Wt 84.142 kg  BMI 25.15 kg/m2  SpO2 98% Physical Exam  Constitutional: He appears well-developed and well-nourished.  HENT:  Head: Normocephalic and  atraumatic.  Mouth/Throat: Oropharynx is clear and moist.  Neck: Normal range of motion. Neck supple.  Cardiovascular: Normal rate, regular rhythm and normal heart sounds.   Pulmonary/Chest: Effort normal and breath sounds normal.  Abdominal: Soft. Bowel sounds are normal. He exhibits no distension and no mass. There is no tenderness. There is no rebound and no guarding.  Musculoskeletal: Normal range of motion.  Neurological: He is alert.  Skin: Skin is warm and dry.  Psychiatric: He has a normal mood and affect.  Nursing note and vitals reviewed.   ED Course  Procedures (including critical care time) Labs Review Labs Reviewed  COMPREHENSIVE METABOLIC PANEL - Abnormal; Notable for the following:    Potassium 3.2 (*)    CO2 19 (*)    ALT 9 (*)    All other components within normal limits  CBC - Abnormal; Notable for the following:    RBC 3.98 (*)    Hemoglobin 11.1 (*)    HCT 33.4 (*)    All other components within normal limits  URINALYSIS, ROUTINE W REFLEX MICROSCOPIC (NOT AT [facility]) - Abnormal; Notable for the following:    Ketones, ur 15 (*)    All other components within normal limits  LIPASE, BLOOD    Imaging Review No results found. I have personally reviewed and evaluated these images and lab results as part of my medical decision-making.   EKG Interpretation None      MDM   Final diagnoses:  None   Patient presents today with nausea, vomiting, and diarrhea.  Symptoms have been reoccurring since he completed chemo for melanoma of his toe back in August of 2016.  Last dose of chemo was 01-23-15.   He denies any abdominal pain.  Abdominal exam is benign without abdominal tenderness.  Labs today are unremarkable.  He was given Zofran ODT in Triage and reported significant improvement in his nausea.  He was drinking Gingerale and eating crackers prior to discharge.  Feel that the patient is stable for discharge.  Given referral to GI.  Family asking for referral to a  local GI physician for a second opinion.  Return precautions given.     Hyman Bible, PA-C 11/15/15 1901  Gareth Morgan, MD 11/16/15 1301

## 2015-11-15 NOTE — ED Notes (Signed)
Pt given crackers and gingerale.

## 2015-11-15 NOTE — ED Notes (Addendum)
Pt has been having N/V/D for around 6 months, has seen GI doctor for same.  Onset 3 days symptoms worsened.  Vomiting x 2 today,diarrhea x 2-3 loose, unable to tolerate food/fluids.

## 2015-11-18 ENCOUNTER — Other Ambulatory Visit: Payer: BLUE CROSS/BLUE SHIELD

## 2015-11-18 ENCOUNTER — Ambulatory Visit: Payer: BLUE CROSS/BLUE SHIELD | Admitting: Hematology & Oncology

## 2015-11-19 ENCOUNTER — Encounter: Payer: Self-pay | Admitting: Neurology

## 2015-11-19 ENCOUNTER — Ambulatory Visit (INDEPENDENT_AMBULATORY_CARE_PROVIDER_SITE_OTHER): Payer: BLUE CROSS/BLUE SHIELD | Admitting: Neurology

## 2015-11-19 VITALS — BP 114/68 | HR 80

## 2015-11-19 DIAGNOSIS — Z85820 Personal history of malignant melanoma of skin: Secondary | ICD-10-CM

## 2015-11-19 DIAGNOSIS — R634 Abnormal weight loss: Secondary | ICD-10-CM

## 2015-11-19 DIAGNOSIS — M62838 Other muscle spasm: Secondary | ICD-10-CM

## 2015-11-19 DIAGNOSIS — M6249 Contracture of muscle, multiple sites: Secondary | ICD-10-CM | POA: Diagnosis not present

## 2015-11-19 DIAGNOSIS — R292 Abnormal reflex: Secondary | ICD-10-CM | POA: Diagnosis not present

## 2015-11-19 DIAGNOSIS — G5700 Lesion of sciatic nerve, unspecified lower limb: Secondary | ICD-10-CM

## 2015-11-19 MED ORDER — BACLOFEN 10 MG PO TABS: ORAL_TABLET | ORAL | Status: DC

## 2015-11-19 NOTE — Progress Notes (Signed)
Follow-up Visit   Date: 11/19/2015    Cordon Lari MRN: [id number] DOB: Feb 14, 1966   Interim History: James Byrd is a 50 y.o. right-handed Caucasian male with stage IIIB right great toe melanoma s/p amputation and adjuvant chemotheray, SVT, hyperlipidemia, depression, and GERD returning to the clinic for follow-up of bilateral leg weakness and numbness.  The patient was accompanied to the clinic by wife who also provides collateral information.    History of present illness: In 2015, he noticed discoloration of this right great toe nailbed which was later biopsied by podiatry and showed malignant melanoma. He went to [facility] where the surgeon amputated the great toe and pathology results showed sentinel lymph node was positive. There was no evidence of metastatic disease on CT. He was recommended to have adjuvant chemotherapy with Curt Bears, which he started in June 2016. Following his first cycle, he developed polyarthralgia with swelling which was managed with decadron. He had a second cycle in July and soon developed depressive symptoms so started on Lexapro. After the third dose, he was hospitalized for acute respiratory failure due to drug-induced pneurmonia and again for sepsis in August - September 2016. Since this time, he has developed generalized weakness of the arms and legs. He denies any low back pain, bowel/bladder dysfunction. He also endorses numbness of the hands which started late fall 2016. He is most bothered by leg stiffness. He had great difficulty with standing up, especially after sitting for prolonged period of time. His legs feel stiff, weak, and tired. Since early 2017, he feels that his symptoms have plateaued.  He has associated 70lb weight loss and endorses lack of appetite. He spends all day sitting on the coach. He does not eat meals unless his wife actively puts the food in front of him and only eats one meal per day. They have cans of ensure  at home, but he does not drink it. He endorses very low mood and lack of motivation to do anything. He used to enjoy gardening, but with his leg stiffness and weakness, he does not have energy to do anything.  UPDATE 11/19/2015:  Patient arrived to his appointment 3 hours late, thinking it was at 1pm when he was scheduled for 11am.  Since his last visit, he has started water therapy and noticed that his walking is a little better.  He is taking baclofen 5mg  twice daily and has not noticed a huge difference. He denies increased sedation with the medication.  He continues to complain of bilateral leg numbness which is always triggered by sitting for greater than a few minutes.  He feels as if his legs fall asleep and if he does not stand up, it continues to worsen.  When he is standing or laying flat, there is no numbness.  His wife feels that he is pinching a nerve.  His MRI cervical, thoracic, and lumbar spine does not show any evidence of metastatic disease.  He has mild canal stenosis at C5-6, but nothing to explain his myelopathic findings.   Medications:  Current Outpatient Prescriptions on File Prior to Visit  Medication Sig Dispense Refill  . Ascorbic Acid (VITAMIN C) 1000 MG tablet Take 1,000 mg by mouth daily.    Marland Kitchen aspirin 81 MG tablet Take 81 mg by mouth daily.    Marland Kitchen augmented betamethasone dipropionate (DIPROLENE-AF) 0.05 % cream     . buPROPion (WELLBUTRIN SR) 100 MG 12 hr tablet Take 100 mg by mouth 2 (two) times daily.    Marland Kitchen  Cholecalciferol (VITAMIN D) 2000 units tablet Take 2,000 Units by mouth daily.    Marland Kitchen DILT-XR 120 MG 24 hr capsule     . escitalopram (LEXAPRO) 10 MG tablet Take 1 tablet (10 mg total) by mouth daily. (Patient taking differently: Take 5 mg by mouth daily. ) 90 tablet 3  . mirtazapine (REMERON) 15 MG tablet Take 15 mg by mouth at bedtime.    . Multiple Vitamins-Minerals (MENS ONE DAILY PO) Take 1 tablet by mouth daily.     . ondansetron (ZOFRAN ODT) 4 MG disintegrating  tablet Take 1 tablet (4 mg total) by mouth every 8 (eight) hours as needed for nausea or vomiting. 20 tablet 0  . pantoprazole (PROTONIX) 40 MG tablet Take 1 tablet (40 mg total) by mouth at bedtime. 30 tablet 0  . vitamin B-12 (CYANOCOBALAMIN) 1000 MCG tablet Take 1,000 mcg by mouth daily.     Current Facility-Administered Medications on File Prior to Visit  Medication Dose Route Frequency Provider Last Rate Last Dose  . 0.9 %  sodium chloride infusion   Intravenous Continuous [name], MD 500 mL/hr at 02/05/15 1510      Allergies: No Known Allergies  Review of Systems:  CONSTITUTIONAL: No fevers, chills, night sweats, or weight loss.  EYES: No visual changes or eye pain ENT: No hearing changes.  No history of nose bleeds.   RESPIRATORY: No cough, wheezing and shortness of breath.   CARDIOVASCULAR: Negative for chest pain, and palpitations.   GI: Negative for abdominal discomfort, blood in stools or black stools.  No recent change in bowel habits.   GU:  No history of incontinence.   MUSCLOSKELETAL: No history of joint pain or swelling.  No myalgias.   SKIN: Negative for lesions, rash, and itching.   ENDOCRINE: Negative for cold or heat intolerance, polydipsia or goiter.   PSYCH:  + depression or anxiety symptoms.   NEURO: As Above.   Vital Signs:  BP 114/68 mmHg  Pulse 80  SpO2 98%  Neurological Exam: MENTAL STATUS including orientation to time, place, person, recent and remote memory, attention span and concentration, language, and fund of knowledge is normal.  Speech is not dysarthric. Blunted affect, but he did smile a few times today.   CRANIAL NERVES:  Face is symmetric.   MOTOR:  Motor strength is 5/5 in all extremities, except bilateral dorsiflexion and toe extension/flexion is 4/5.  No atrophy, fasciculations or abnormal movements.  No pronator drift.  Tone is increased in the legs 0+  MSRs:  Reflexes are 2+/4 throughout, except 3+ bilateral patella with crossed  adductors.  Extensor plantar responses  SENSORY:  Intact to vibration  COORDINATION/GAIT:  Gait is slow, small steps, less spastic than previously and stable.  Data: MRI brain wwo contrast 06/16/2015: Negative  MRI brain wwo contrast 03/10/2015:  1. No acute intracranial abnormality. 2. Extensive paranasal sinus mucosal disease, correlate clinically for acute sinusitis. 3. Left larger than right mastoid effusions.  MRI thoracic spine wwo contrast 10/29/2015: Normal.  MRI cercical spine wwo contrast 11/11/2015: 1.  No acute findings to explain patient's symptoms.  No evidence of myelopathy or cord compression. 2.  Spondylosis at C5-6 contributes to borderline spinal stenosis and moderate foraminal stenosis bilaterally.  There is potential encroachment of the C6 nerve root. 3.  Mild left foraminal narrowing at C6-7. 4.  Sinusitis  MRI lumbar spine wwo contrast 10/16/2015:   1.  No areas of metastatic disease of the lumbar spine. 2.  At  L4-5 there is a mild broad based disc bulge with a right lateral disc protrusion abutting the right extra foraminal L4 nerve root.  Bilateral lateral recess stenosis.  Mild bilateral facet arthropathy.  Labs 10/09/2015:  Vitamin B12 601, vitamin B1 11, copper 98, zinc 89, ceruloplasmin 25, AChR antibody neg  IMPRESSION/PLAN: 1. Bilateral leg weakness and stiffness due to myelopathy - unclear etiology - Imaging of the entire neuroaxis is essentially normal and does not explain his UMN findings.  Reassured patient there is no evidence of cancer recurrence.  ?if this is due to chemotherapy side effect, but would expect to see signal changes of the cord.    - Myelopathy labs are also normal  - Increase baclofen 10mg  twice daily - Continue physical therapy for leg stretching - Fall precautions discussed - Recommend using a cane  2. Bilateral leg numbness, suspect this is due to loss of normal fat padding  and direct compression of the sciatic nerve against the pelvis  - NCS/EMG of the legs will be performed  - Recommend sitting on soft cushion to minimize direct compression  3. History of melanoma affecting the right hallux s/p amputation and adjuvant therapy with Curt Bears - Literature search shows case reports of associated myasthenia, axonal motor neuropathy, and GBS-like syndrome with Curt Bears, but Mr. Markuson has more upper motor neuron signs concerning for myelopathy - With this said, neuropathy cannot be excluded especially with his feet weakness so will proceed with electrodiagnostic testing  Return to clinic in 2 months  The duration of this appointment visit was 25 minutes of face-to-face time with the patient.  Greater than 50% of this time was spent in counseling, explanation of diagnosis, planning of further management, and coordination of care.   Thank you for allowing me to participate in patient's care.  If I can answer any additional questions, I would be pleased to do so.    Sincerely,    Donika K. Posey Pronto, DO

## 2015-11-19 NOTE — Patient Instructions (Addendum)
1.  Increase baclofen to 10mg  at bedtime for one week, then increase to 1 tablet twice daily 2.  NCS/EMG of the legs 3.  Try to sit on soft cushion or donut to avoid direct compression of the nerve   Return to clinic in 2 months

## 2015-11-27 ENCOUNTER — Other Ambulatory Visit (HOSPITAL_BASED_OUTPATIENT_CLINIC_OR_DEPARTMENT_OTHER): Payer: BLUE CROSS/BLUE SHIELD

## 2015-11-27 ENCOUNTER — Other Ambulatory Visit: Payer: BLUE CROSS/BLUE SHIELD

## 2015-11-27 ENCOUNTER — Ambulatory Visit: Payer: BLUE CROSS/BLUE SHIELD | Admitting: Hematology & Oncology

## 2015-12-08 ENCOUNTER — Ambulatory Visit (INDEPENDENT_AMBULATORY_CARE_PROVIDER_SITE_OTHER): Payer: BLUE CROSS/BLUE SHIELD | Admitting: Neurology

## 2015-12-08 DIAGNOSIS — R292 Abnormal reflex: Secondary | ICD-10-CM | POA: Diagnosis not present

## 2015-12-08 DIAGNOSIS — R634 Abnormal weight loss: Secondary | ICD-10-CM

## 2015-12-08 DIAGNOSIS — T451X5A Adverse effect of antineoplastic and immunosuppressive drugs, initial encounter: Secondary | ICD-10-CM

## 2015-12-08 DIAGNOSIS — Z85820 Personal history of malignant melanoma of skin: Secondary | ICD-10-CM

## 2015-12-08 DIAGNOSIS — M62838 Other muscle spasm: Secondary | ICD-10-CM

## 2015-12-08 DIAGNOSIS — G620 Drug-induced polyneuropathy: Secondary | ICD-10-CM

## 2015-12-08 NOTE — Procedures (Signed)
[facility]  [address]  [place], [place] [address] Tel: [phone] Fax:  [phone] Test Date:  12/08/2015  Patient: James Byrd DOB: [birth year] Physician: Narda Amber, DO  Sex: Male Height: 6' " Ref Phys: Narda Amber, DO  ID#: [id number] Temp: 33.8C Technician: Jerilynn Mages. Dean   Patient Complaints: This is a 50 year old gentleman with history of melanoma status post resection and chemotherapy referred for evaluation of numbness, tingling and pain in legs bilaterally.  NCV & EMG Findings: Extensive electrodiagnostic testing of the right lower extremity and additional studies of the left shows: 1. Bilateral sural and superficial peroneal sensory responses are absent. 2. Bilateral peroneal (EBD) and tibial motor responses are reduced. Bilateral peroneal motor responses recording at the tibialis anterior are within normal limits.  Of note, tibial motor responses show temporal dispersion.   3. Left tibial H reflex is mildly prolonged. Right tibial H reflex is within normal limits. 4. Chronic motor axon loss changes are seen affectingThe muscles of the lower extremity following a gradient pattern and most severe below the knee. There is no evidence of accompanied active denervation.   Impression: The electrophysiologic findings are most consistent with a chronic and symmetric sensorimotor polyneuropathy, axonal loss and demyelinating in type, affecting the lower extremities.   ___________________________ Narda Amber, DO    Nerve Conduction Studies Anti Sensory Summary Table   Site NR Peak (ms) Norm Peak (ms) P-T Amp (V) Norm P-T Amp  Left Sup Peroneal Anti Sensory (Ant Lat Mall)  33.8C  12 cm NR  <4.5  >5  Right Sup Peroneal Anti Sensory (Ant Lat Mall)  12 cm NR  <4.5  >5  Left Sural Anti Sensory (Lat Mall)  33.8C  Calf NR  <4.5  >5  Right Sural Anti Sensory (Lat Mall)  Calf NR  <4.5  >5   Motor Summary Table   Site NR Onset (ms) Norm Onset  (ms) O-P Amp (mV) Norm O-P Amp Site1 Site2 Delta-0 (ms) Dist (cm) Vel (m/s) Norm Vel (m/s)  Left Peroneal Motor (Ext Dig Brev)  33.8C  Ankle    5.2 <5.5 0.5 >3 B Fib Ankle 9.3 37.0 40 >40  B Fib    14.5  0.6  Poplt B Fib 2.3 10.0 43 >40  Poplt    16.8  0.6         Right Peroneal Motor (Ext Dig Brev)  Ankle    4.5 <5.5 0.4 >3 B Fib Ankle 8.1 33.0 41 >40  B Fib    12.6  0.3  Poplt B Fib 2.2 10.0 45 >40  Poplt    14.8  0.3         Left Peroneal TA Motor (Tib Ant)  33.8C  Fib Head    2.9 <4.0 4.5 >4 Poplit Fib Head 1.9 10.0 53 >40  Poplit    4.8  4.2         Right Peroneal TA Motor (Tib Ant)  Fib Head    2.5 <4.0 4.5 >4 Poplit Fib Head 2.3 10.0 43 >40  Poplit    4.8  4.1         Left Tibial Motor (Abd Hall Brev)  33.8C  Ankle    4.4 <6.0 0.4 >8 Knee Ankle 10.5 42.0 40 >40  Knee    14.9  0.3         Right Tibial Motor (Abd Hall Brev)  Ankle    3.7 <6.0 0.9 >8 Knee Ankle 11.2 42.0  38 >40  Knee    14.9  0.6          F Wave Studies  NR F-Lat (ms) Lat Norm (ms) L-R F-Lat (ms)  Right Tibial (Mrkrs) (Abd Hallucis)  33.8C     53.71 <55    H Reflex Studies  NR H-Lat (ms) Lat Norm (ms) L-R H-Lat (ms) M-Lat (ms) HLat-MLat (ms)  Left Tibial (Gastroc)  33.8C     38.10 <35 5.58 5.99 32.11  Right Tibial (Gastroc)     32.52 <35 5.58 5.99 26.53   EMG   Side Muscle Ins Act Fibs Psw Fasc Number Recrt Dur Dur. Amp Amp. Poly Poly. Comment  Right AntTibialis Nml Nml Nml Nml 1- Mod-R Some 1+ Some 1+ Nml Nml N/A  Right Gastroc Nml Nml Nml Nml 1- Mod-R Some 1+ Some 1+ Nml Nml N/A  Right Flex Dig Long Nml Nml Nml Nml 2- Rapid Some 1+ Some 1+ Nml Nml N/A  Right RectFemoris Nml Nml Nml Nml 1- Rapid Few 1+ Few 1+ Nml Nml N/A  Right GluteusMed Nml Nml Nml Nml Nml Nml Nml Nml Nml Nml Nml Nml N/A  Right AdductorLong Nml Nml Nml Nml 1- Rapid Few 1+ Few 1+ Nml Nml N/A  Right Lumbo Parasp Low Nml Nml Nml Nml Nml Nml Nml Nml Nml Nml Nml Nml N/A  Left AntTibialis Nml Nml Nml Nml 1- Rapid Some 1+ Some 1+ Nml  Nml N/A  Left Gastroc Nml Nml Nml Nml 1- Rapid Some 1+ Some 1+ Nml Nml N/A  Left RectFemoris Nml Nml Nml Nml 1- Mod-R Few 1+ Few 1+ Nml Nml N/A     Waveforms:

## 2015-12-09 ENCOUNTER — Telehealth: Payer: Self-pay | Admitting: Neurology

## 2015-12-09 NOTE — Telephone Encounter (Signed)
James Byrd 11/08/2065. His wife Hoyle Sauer was returning Dr. Serita Grit phone call regarding EMG results. She said you can contact her at [phone]. She will be available around 745 or 8:00 AM. Thank you

## 2015-12-09 NOTE — Telephone Encounter (Signed)
I attempted to contact patient via phone today regarding the results of EMG, however there was no answer so a message was left for the patient to return my call.   Tom Macpherson K. Posey Pronto, DO

## 2015-12-10 MED ORDER — GABAPENTIN 300 MG PO CAPS: ORAL_CAPSULE | ORAL | Status: DC

## 2015-12-10 NOTE — Addendum Note (Signed)
Addended by: Alda Berthold on: 12/10/2015 08:26 AM   Modules accepted: Orders

## 2015-12-10 NOTE — Telephone Encounter (Signed)
FYI

## 2015-12-10 NOTE — Telephone Encounter (Signed)
Called patient to discuss EMG results which is most consistent with a chronic sensorimotor polyneuropathy, predominately axon loss in type.  There is no evidence of myopathy.  He has already had a number of labs looking for etiology which has been negative. Because there were very subtle demyelinating changes, I offered CSF testing to look for increased protein and inflammation, but he already had CSF testing in September 2016 when he presented with altered mental status and weakness.  CSF testing was normal.  Because his EMG showed chronic findings, I do not feel that it is worth repeating a lumbar puncture.  Most likely, his neuropathy is due to chemotherapy.  Unfortunately, besides symptom management, there is no therapeutic intervention.  I will start gabapentin 300mg  at bedtime x 3 days and increase to 300mg  BID.  Weight gain is also encouraged.  Amylia Collazos K. Posey Pronto, DO

## 2015-12-10 NOTE — Telephone Encounter (Signed)
See previous note

## 2015-12-11 ENCOUNTER — Ambulatory Visit: Payer: BLUE CROSS/BLUE SHIELD | Admitting: Neurology

## 2015-12-31 ENCOUNTER — Telehealth: Payer: Self-pay | Admitting: Neurology

## 2015-12-31 ENCOUNTER — Other Ambulatory Visit: Payer: Self-pay | Admitting: *Deleted

## 2015-12-31 MED ORDER — GABAPENTIN 600 MG PO TABS: 600.0000 mg | ORAL_TABLET | Freq: Two times a day (BID) | ORAL | Status: DC

## 2015-12-31 MED ORDER — GABAPENTIN 300 MG PO CAPS: ORAL_CAPSULE | ORAL | Status: DC

## 2015-12-31 NOTE — Telephone Encounter (Signed)
Pt wants to talk to someone about possible medication dosage change please call [phone]

## 2015-12-31 NOTE — Telephone Encounter (Signed)
I'm assuming he is referring to gabapentin?  If so, please instruct him to increase to gabapentin 600mg  twice daily - start taking 300mg  in the morning and 600mg  at bedtime x 3 days, then 600mg  twice daily. If it makes him sleepy, stay on lower dose. Rx will be sent.   [place] Lapaglia K. Posey Pronto, DO

## 2015-12-31 NOTE — Telephone Encounter (Signed)
Patient would like to increase med.  He has been taking 1 pills bid and it is helping.  Please advise.  They would like 90 day supply,

## 2015-12-31 NOTE — Telephone Encounter (Signed)
Gave patient's wife instructions and sent Rx in for a 90 day supply.

## 2016-01-15 ENCOUNTER — Telehealth: Payer: Self-pay | Admitting: *Deleted

## 2016-01-15 ENCOUNTER — Other Ambulatory Visit: Payer: Self-pay | Admitting: *Deleted

## 2016-01-15 MED ORDER — GABAPENTIN 300 MG PO CAPS: 900.0000 mg | ORAL_CAPSULE | Freq: Two times a day (BID) | ORAL | 3 refills | Status: DC

## 2016-01-15 NOTE — Telephone Encounter (Signed)
Done.  Rx called in and patient coming in on Monday.

## 2016-01-15 NOTE — Telephone Encounter (Signed)
Is he taking gabapentin 600mg  TID?  If it's not making him sleepy and is helping, we can increase to 900mg  TID.

## 2016-01-18 ENCOUNTER — Encounter: Payer: Self-pay | Admitting: Neurology

## 2016-01-18 ENCOUNTER — Other Ambulatory Visit: Payer: BLUE CROSS/BLUE SHIELD

## 2016-01-18 ENCOUNTER — Ambulatory Visit (INDEPENDENT_AMBULATORY_CARE_PROVIDER_SITE_OTHER): Payer: BLUE CROSS/BLUE SHIELD | Admitting: Neurology

## 2016-01-18 VITALS — BP 100/68 | HR 76 | Ht 72.0 in | Wt 192.0 lb

## 2016-01-18 DIAGNOSIS — R292 Abnormal reflex: Secondary | ICD-10-CM

## 2016-01-18 DIAGNOSIS — M6249 Contracture of muscle, multiple sites: Secondary | ICD-10-CM

## 2016-01-18 DIAGNOSIS — M62838 Other muscle spasm: Secondary | ICD-10-CM

## 2016-01-18 NOTE — Patient Instructions (Addendum)
1.  Increase gabapentin to 900mg  three times daily 2.  Check blood work  Reschedule appointment to October

## 2016-01-18 NOTE — Progress Notes (Signed)
Follow-up Visit   Date: 01/18/16    James Byrd MRN: [id number] DOB: 12-Oct-1965   Interim History: James Byrd is a 50 y.o. right-handed Caucasian male with stage IIIB right great toe melanoma s/p amputation and adjuvant chemotheray, SVT, hyperlipidemia, depression, and GERD returning to the clinic for follow-up of bilateral leg weakness and numbness.  The patient was accompanied to the clinic by wife who also provides collateral information.    History of present illness: In 2015, he noticed discoloration of this right great toe nailbed which was later biopsied by podiatry and showed malignant melanoma. He went to [facility] where the surgeon amputated the great toe and pathology results showed sentinel lymph node was positive. There was no evidence of metastatic disease on CT. He was recommended to have adjuvant chemotherapy with Curt Bears, which he started in June 2016. Following his first cycle, he developed polyarthralgia with swelling which was managed with decadron. He had a second cycle in July and soon developed depressive symptoms so started on Lexapro. After the third dose, he was hospitalized for acute respiratory failure due to drug-induced pneurmonia and again for sepsis in August - September 2016. Since this time, he has developed generalized weakness of the arms and legs. He denies any low back pain, bowel/bladder dysfunction. He also endorses numbness of the hands which started late fall 2016. He is most bothered by leg stiffness. He had great difficulty with standing up, especially after sitting for prolonged period of time. His legs feel stiff, weak, and tired. Since early 2017, he feels that his symptoms have plateaued.  He has associated 70lb weight loss and endorses lack of appetite. He spends all day sitting on the coach. He does not eat meals unless his wife actively puts the food in front of him and only eats one meal per day. They have cans of ensure at  home, but he does not drink it. He endorses very low mood and lack of motivation to do anything. He used to enjoy gardening, but with his leg stiffness and weakness, he does not have energy to do anything.  UPDATE 11/19/2015:  Patient arrived to his appointment 3 hours late, thinking it was at 1pm when he was scheduled for 11am.  Since his last visit, he has started water therapy and noticed that his walking is a little better.  He is taking baclofen 5mg  twice daily and has not noticed a huge difference. He denies increased sedation with the medication.  He continues to complain of bilateral leg numbness which is always triggered by sitting for greater than a few minutes.  He feels as if his legs fall asleep and if he does not stand up, it continues to worsen.  When he is standing or laying flat, there is no numbness.  His wife feels that he is pinching a nerve.  His MRI cervical, thoracic, and lumbar spine does not show any evidence of metastatic disease.  He has mild canal stenosis at C5-6, but nothing to explain his myelopathic findings.   UPDATE 01/18/2016:  Patient requested sooner appointment due to worsening numbness/tingling of the legs.  He is currently taking gabapentin 900mg  BID which he is tolerating.  He continues to have marked difficulty with leg stiffness and nearly always has to flex his legs when resting/sitting.  His weight has been stable.  Mood remains fair.  No new complaints.   Medications:  Current Outpatient Prescriptions on File Prior to Visit  Medication Sig Dispense Refill  .  Ascorbic Acid (VITAMIN C) 1000 MG tablet Take 1,000 mg by mouth daily.    Marland Kitchen aspirin 81 MG tablet Take 81 mg by mouth daily.    Marland Kitchen augmented betamethasone dipropionate (DIPROLENE-AF) 0.05 % cream     . baclofen (LIORESAL) 10 MG tablet Take 1 tablet at bedtime for 1 week, then increase to 1 tablet twice daily. 60 each 3  . buPROPion (WELLBUTRIN SR) 100 MG 12 hr tablet Take 100 mg by mouth 2 (two) times  daily.    . Cholecalciferol (VITAMIN D) 2000 units tablet Take 2,000 Units by mouth daily.    Marland Kitchen escitalopram (LEXAPRO) 10 MG tablet Take 1 tablet (10 mg total) by mouth daily. (Patient taking differently: Take 5 mg by mouth daily. ) 90 tablet 3  . gabapentin (NEURONTIN) 300 MG capsule Take 3 capsules (900 mg total) by mouth 2 (two) times daily. 540 capsule 3  . mirtazapine (REMERON) 15 MG tablet Take 15 mg by mouth at bedtime.    . Multiple Vitamins-Minerals (MENS ONE DAILY PO) Take 1 tablet by mouth daily.     . ondansetron (ZOFRAN ODT) 4 MG disintegrating tablet Take 1 tablet (4 mg total) by mouth every 8 (eight) hours as needed for nausea or vomiting. 20 tablet 0  . pantoprazole (PROTONIX) 40 MG tablet Take 1 tablet (40 mg total) by mouth at bedtime. 30 tablet 0  . vitamin B-12 (CYANOCOBALAMIN) 1000 MCG tablet Take 1,000 mcg by mouth daily.    Marland Kitchen DILT-XR 120 MG 24 hr capsule      Current Facility-Administered Medications on File Prior to Visit  Medication Dose Route Frequency Provider Last Rate Last Dose  . 0.9 %  sodium chloride infusion   Intravenous Continuous [name], MD 500 mL/hr at 02/05/15 1510      Allergies: No Known Allergies  Review of Systems:  CONSTITUTIONAL: No fevers, chills, night sweats, or weight loss.  EYES: No visual changes or eye pain ENT: No hearing changes.  No history of nose bleeds.   RESPIRATORY: No cough, wheezing and shortness of breath.   CARDIOVASCULAR: Negative for chest pain, and palpitations.   GI: Negative for abdominal discomfort, blood in stools or black stools.  No recent change in bowel habits.   GU:  No history of incontinence.   MUSCLOSKELETAL: No history of joint pain or swelling.  No myalgias.   SKIN: Negative for lesions, rash, and itching.   ENDOCRINE: Negative for cold or heat intolerance, polydipsia or goiter.   PSYCH:  + depression or anxiety symptoms.   NEURO: As Above.   Vital Signs:  BP 100/68   Pulse 76   Ht 6' (1.829  m)   Wt 192 lb (87.1 kg)   SpO2 97%   BMI 26.04 kg/m   Neurological Exam: MENTAL STATUS including orientation to time, place, person, recent and remote memory, attention span and concentration, language, and fund of knowledge is normal.  Speech is not dysarthric. Blunted affect, but he did smile a few times today.   CRANIAL NERVES:  Face is symmetric.   MOTOR:  Motor strength is 5/5 in all extremities, except bilateral dorsiflexion and toe extension/flexion is 4/5.  No atrophy, fasciculations or abnormal movements.  No pronator drift.  Tone is increased in the legs 0+  MSRs:  Reflexes are 2+/4 throughout, except 3+ bilateral patella with crossed adductors.  Extensor plantar responses  SENSORY:  Intact to vibration  COORDINATION/GAIT:  Gait is slow, small steps, less spastic than previously  and stable.  Data: MRI brain wwo contrast 06/16/2015: Negative  MRI brain wwo contrast 03/10/2015:  1. No acute intracranial abnormality. 2. Extensive paranasal sinus mucosal disease, correlate clinically for acute sinusitis. 3. Left larger than right mastoid effusions.  MRI thoracic spine wwo contrast 10/29/2015: Normal.  MRI cercical spine wwo contrast 11/11/2015: 1.  No acute findings to explain patient's symptoms.  No evidence of myelopathy or cord compression. 2.  Spondylosis at C5-6 contributes to borderline spinal stenosis and moderate foraminal stenosis bilaterally.  There is potential encroachment of the C6 nerve root. 3.  Mild left foraminal narrowing at C6-7. 4.  Sinusitis  MRI lumbar spine wwo contrast 10/16/2015:   1.  No areas of metastatic disease of the lumbar spine. 2.  At L4-5 there is a mild broad based disc bulge with a right lateral disc protrusion abutting the right extra foraminal L4 nerve root.  Bilateral lateral recess stenosis.  Mild bilateral facet arthropathy.  Labs 10/09/2015:  Vitamin B12 601, vitamin B1 11, copper 98, zinc 89, ceruloplasmin 25, AChR antibody  neg  NCS/EMG of the legs 12/08/2015:  The electrophysiologic findings are most consistent with a chronic and symmetric sensorimotor polyneuropathy, axonal loss and demyelinating in type, affecting the lower extremities.  IMPRESSION/PLAN: 1. Bilateral leg weakness and stiffness due to myelopathy - unclear etiology - Imaging of the entire neuroaxis is essentially normal and does not show structural changes. ?if this is due to chemotherapy side effect, but would expect to see signal changes of the cord.    - Myelopathy labs are also normal  - Check GAD65 antibody   - Continue baclofen 10mg  twice daily - Continue home exercises for leg stretching - Fall precautions discussed - Recommend using a cane  2. Bilateral leg numbness and pain, suspect this is due to loss of normal fat padding and direct compression of the sciatic nerve against the pelvis.  EMG showed sensorimotor polyneuropathy affecting the feet, axonal and demyelinating  - Strategies to minimize nerve compression discussed  - Increase gabapentin to 900mg  TID  3. History of melanoma affecting the right hallux s/p amputation and adjuvant therapy with Yervoy.  Literature search shows case reports of associated myasthenia, axonal motor neuropathy, and GBS-like syndrome with Yervoy, but Mr. Palmese has myelopathy  Return to clinic in 3 months  The duration of this appointment visit was 30 minutes of face-to-face time with the patient.  Greater than 50% of this time was spent in counseling, explanation of diagnosis, planning of further management, and coordination of care.   Thank you for allowing me to participate in patient's care.  If I can answer any additional questions, I would be pleased to do so.    Sincerely,    Clinton Dragone K. Posey Pronto, DO

## 2016-01-19 LAB — GLUTAMIC ACID DECARBOXYLASE AUTO ABS

## 2016-01-20 ENCOUNTER — Other Ambulatory Visit: Payer: BLUE CROSS/BLUE SHIELD

## 2016-01-20 ENCOUNTER — Ambulatory Visit: Payer: BLUE CROSS/BLUE SHIELD | Admitting: Hematology & Oncology

## 2016-02-07 ENCOUNTER — Encounter (HOSPITAL_COMMUNITY): Payer: Self-pay

## 2016-02-07 ENCOUNTER — Emergency Department (HOSPITAL_COMMUNITY): Payer: BLUE CROSS/BLUE SHIELD

## 2016-02-07 ENCOUNTER — Emergency Department (HOSPITAL_COMMUNITY)
Admission: EM | Admit: 2016-02-07 | Discharge: 2016-02-07 | Disposition: A | Discharge status: Home / Self Care | Payer: BLUE CROSS/BLUE SHIELD | Attending: Emergency Medicine | Admitting: Emergency Medicine

## 2016-02-07 DIAGNOSIS — I5032 Chronic diastolic (congestive) heart failure: Secondary | ICD-10-CM | POA: Diagnosis not present

## 2016-02-07 DIAGNOSIS — Z7982 Long term (current) use of aspirin: Secondary | ICD-10-CM | POA: Diagnosis not present

## 2016-02-07 DIAGNOSIS — Z79899 Other long term (current) drug therapy: Secondary | ICD-10-CM | POA: Diagnosis not present

## 2016-02-07 DIAGNOSIS — M10021 Idiopathic gout, right elbow: Secondary | ICD-10-CM | POA: Diagnosis not present

## 2016-02-07 DIAGNOSIS — M25521 Pain in right elbow: Secondary | ICD-10-CM

## 2016-02-07 DIAGNOSIS — Z85820 Personal history of malignant melanoma of skin: Secondary | ICD-10-CM | POA: Insufficient documentation

## 2016-02-07 DIAGNOSIS — M109 Gout, unspecified: Secondary | ICD-10-CM

## 2016-02-07 HISTORY — DX: Gout, unspecified: M10.9

## 2016-02-07 LAB — CBC WITH DIFFERENTIAL/PLATELET
BASOS ABS: 0 10*3/uL (ref 0.0–0.1)
Basophils Relative: 0 %
Eosinophils Absolute: 0.2 10*3/uL (ref 0.0–0.7)
Eosinophils Relative: 4 %
HEMATOCRIT: 34.8 % — AB (ref 39.0–52.0)
Hemoglobin: 11.4 g/dL — ABNORMAL LOW (ref 13.0–17.0)
LYMPHS ABS: 1.8 10*3/uL (ref 0.7–4.0)
LYMPHS PCT: 46 %
MCH: 28.9 pg (ref 26.0–34.0)
MCHC: 32.8 g/dL (ref 30.0–36.0)
MCV: 88.1 fL (ref 78.0–100.0)
Monocytes Absolute: 0.4 10*3/uL (ref 0.1–1.0)
Monocytes Relative: 9 %
NEUTROS ABS: 1.7 10*3/uL (ref 1.7–7.7)
Neutrophils Relative %: 41 %
Platelets: 169 10*3/uL (ref 150–400)
RBC: 3.95 MIL/uL — AB (ref 4.22–5.81)
RDW: 13.5 % (ref 11.5–15.5)
WBC: 4 10*3/uL (ref 4.0–10.5)

## 2016-02-07 LAB — COMPREHENSIVE METABOLIC PANEL
ALK PHOS: 63 U/L (ref 38–126)
ALT: 11 U/L — AB (ref 17–63)
AST: 20 U/L (ref 15–41)
Albumin: 4.2 g/dL (ref 3.5–5.0)
Anion gap: 7 (ref 5–15)
BILIRUBIN TOTAL: 0.7 mg/dL (ref 0.3–1.2)
BUN: 20 mg/dL (ref 6–20)
CALCIUM: 9.5 mg/dL (ref 8.9–10.3)
CO2: 22 mmol/L (ref 22–32)
CREATININE: 0.99 mg/dL (ref 0.61–1.24)
Chloride: 109 mmol/L (ref 101–111)
Glucose, Bld: 81 mg/dL (ref 65–99)
Potassium: 4.1 mmol/L (ref 3.5–5.1)
Sodium: 138 mmol/L (ref 135–145)
TOTAL PROTEIN: 7.2 g/dL (ref 6.5–8.1)

## 2016-02-07 LAB — C-REACTIVE PROTEIN: CRP: 0.6 mg/dL (ref <1.0)

## 2016-02-07 LAB — SEDIMENTATION RATE: Sed Rate: 33 mm/hr — ABNORMAL HIGH (ref 0–16)

## 2016-02-07 LAB — I-STAT CG4 LACTIC ACID, ED: LACTIC ACID, VENOUS: 0.79 mmol/L (ref 0.5–1.9)

## 2016-02-07 MED ORDER — COLCHICINE 0.6 MG PO TABS: 0.6000 mg | ORAL_TABLET | Freq: Every day | ORAL | 0 refills | Status: DC

## 2016-02-07 MED ORDER — SODIUM CHLORIDE 0.9 % IV BOLUS (SEPSIS)
1000.0000 mL | Freq: Once | INTRAVENOUS | Status: AC
  Administered 2016-02-07: 1000 mL via INTRAVENOUS

## 2016-02-07 MED ORDER — KETOROLAC TROMETHAMINE 30 MG/ML IJ SOLN
30.0000 mg | Freq: Once | INTRAMUSCULAR | Status: AC
  Administered 2016-02-07: 30 mg via INTRAVENOUS
  Filled 2016-02-07: qty 1

## 2016-02-07 MED ORDER — INDOMETHACIN 25 MG PO CAPS: 25.0000 mg | ORAL_CAPSULE | Freq: Three times a day (TID) | ORAL | 0 refills | Status: DC | PRN

## 2016-02-07 MED ORDER — LIDOCAINE-EPINEPHRINE (PF) 2 %-1:200000 IJ SOLN
10.0000 mL | Freq: Once | INTRAMUSCULAR | Status: AC
  Administered 2016-02-07: 10 mL via INTRADERMAL
  Filled 2016-02-07: qty 20

## 2016-02-07 MED ORDER — HYDROCODONE-ACETAMINOPHEN 5-325 MG PO TABS
1.0000 | ORAL_TABLET | Freq: Once | ORAL | Status: AC
  Administered 2016-02-07: 1 via ORAL
  Filled 2016-02-07: qty 1

## 2016-02-07 MED ORDER — OXYCODONE-ACETAMINOPHEN 5-325 MG PO TABS: 2.0000 | ORAL_TABLET | ORAL | 0 refills | Status: DC | PRN

## 2016-02-07 MED ORDER — SODIUM CHLORIDE 0.9 % IV BOLUS (SEPSIS): 1000.0000 mL | Freq: Once | INTRAVENOUS | Status: DC

## 2016-02-07 NOTE — ED Notes (Signed)
Patient states he did not take the metoprolol this morning, it was taken last night.

## 2016-02-07 NOTE — ED Triage Notes (Signed)
Patient here with right elbow pain and swelling x 3 days, has hx of gout, denies trauma. Found to be hypotensive at triage, took lopressor this am

## 2016-02-07 NOTE — ED Provider Notes (Signed)
[place] DEPT Provider Note   CSN: [id number] Arrival date & time: 02/07/16  0913     History   Chief Complaint Chief Complaint  Patient presents with  . Elbow Pain    HPI James Byrd is a 50 y.o. male.  HPI   3 days of severe elbow pain, worse with movement. Has been taking 800 mg ibuprofen without relief. Has a history of gout, this feels similar. Denies any fevers, lightheadedness, cough, black or bloody stools, other infectious symptoms. Denies trauma. Pain is severe, 10/10.  Began Lopressor recently instead of diltiazem, given concern that diltiazem was affecting his blood pressures more. On review of outpatient records, patient has a history of systolic blood pressures in the 90s at baseline. He has started no other new medications. He took 900 mg of gabapentin this morning.  Wife notes that he's had decreased by mouth intake over the last couple weeks, as well as depression. Pt reports depression, denies SI.  Past Medical History:  Diagnosis Date  . Anxiety 05/24/2012  . Chronic diastolic congestive heart failure ([place])   . Gout   . HLD (hyperlipidemia)   . Malignant melanoma of right great toe ([place]) 11/14/2014  . Paroxysmal supraventricular tachycardia ([place]) 05/24/2012   Described in the treadmill report; details are pending     Patient Active Problem List   Diagnosis Date Noted  . Adrenal insufficiency ([place])   . Refractory nausea and vomiting   . Paranasal sinus disease   . Acute renal failure syndrome ([place])   . Drug-induced pneumonitis - VERVOY   . Thrombocytopenia ([place]) 02/09/2015  . HLD (hyperlipidemia)   . Chronic diastolic congestive heart failure ([place])   . Malignant melanoma of right great toe Stage IIIB [id number]) 11/14/2014  . Obstructive sleep apnea 07/19/2012  . Paroxysmal supraventricular tachycardia ([place]) 05/24/2012  . Chest pressure 05/24/2012  . Palpitations 05/24/2012  . Anxiety 05/24/2012    Past Surgical History:  Procedure Laterality  Date  . ABLATION    . r toe partial ambutation    . ROTATOR CUFF REPAIR  2010   3 yrs ago  . ROTATOR CUFF REPAIR         Home Medications    Prior to Admission medications   Medication Sig Start Date End Date Taking? Authorizing Provider  Ascorbic Acid (VITAMIN C) 1000 MG tablet Take 1,000 mg by mouth daily.   Yes Historical Provider, MD  aspirin 81 MG tablet Take 81 mg by mouth daily.   Yes Historical Provider, MD  augmented betamethasone dipropionate (DIPROLENE-AF) 0.05 % cream Apply 1 application topically 2 (two) times daily as needed.  09/29/15  Yes Historical Provider, MD  baclofen (LIORESAL) 10 MG tablet Take 1 tablet at bedtime for 1 week, then increase to 1 tablet twice daily. Patient taking differently: Take 10 mg by mouth 2 (two) times daily as needed for muscle spasms.  11/19/15  Yes Donika Keith Rake, DO  Cholecalciferol (VITAMIN D) 2000 units tablet Take 2,000 Units by mouth daily.   Yes Historical Provider, MD  gabapentin (NEURONTIN) 300 MG capsule Take 3 capsules (900 mg total) by mouth 2 (two) times daily. Patient taking differently: Take 900 mg by mouth 3 (three) times daily.  01/15/16  Yes Donika K Patel, DO  ibuprofen (ADVIL,MOTRIN) 200 MG tablet Take 800 mg by mouth every 8 (eight) hours as needed.   Yes Historical Provider, MD  metoprolol succinate (TOPROL-XL) 50 MG 24 hr tablet Take 50 mg by mouth at bedtime. Take  with or immediately following a meal.    Yes Historical Provider, MD  colchicine 0.6 MG tablet Take 1 tablet (0.6 mg total) by mouth daily. Take 2 tablets (1.2mg ) at first sign of flare, followed by 1 tablet (.6mg ) 1 hour later.  Take .6mg  daily for prevention of flares. 02/07/16   Gareth Morgan, MD  indomethacin (INDOCIN) 25 MG capsule Take 1 capsule (25 mg total) by mouth 3 (three) times daily as needed. 02/07/16   Gareth Morgan, MD  oxyCODONE-acetaminophen (PERCOCET/ROXICET) 5-325 MG tablet Take 2 tablets by mouth every 4 (four) hours as needed for severe  pain. 02/07/16   Gareth Morgan, MD    Family History Family History  Problem Relation Age of Onset  . Stroke Mother   . COPD Father   . Diabetes Father   . Diabetes Sister   . Diabetes Brother     Social History Social History  Substance Use Topics  . Smoking status: Never Smoker  . Smokeless tobacco: Never Used  . Alcohol use No     Allergies   Review of patient's allergies indicates no known allergies.   Review of Systems Review of Systems  Constitutional: Positive for fatigue (chronic unchanged). Negative for fever.  HENT: Negative for sore throat.   Eyes: Negative for visual disturbance.  Respiratory: Negative for shortness of breath.   Cardiovascular: Negative for chest pain.  Gastrointestinal: Negative for abdominal pain, nausea and vomiting.  Genitourinary: Negative for difficulty urinating.  Musculoskeletal: Positive for arthralgias. Negative for back pain and neck stiffness.  Skin: Negative for rash.  Neurological: Negative for syncope and headaches.     Physical Exam Updated Vital Signs BP 103/74   Pulse 64   Temp 97.9 F (36.6 C) (Oral)   Resp 11   Ht 6' (1.829 m)   Wt 188 lb 4.8 oz (85.4 kg)   SpO2 99%   BMI 25.54 kg/m   Physical Exam  Constitutional: He is oriented to person, place, and time. He appears well-developed and well-nourished. No distress.  HENT:  Head: Normocephalic and atraumatic.  Eyes: Conjunctivae and EOM are normal.  Neck: Normal range of motion.  Cardiovascular: Normal rate, regular rhythm, normal heart sounds and intact distal pulses.  Exam reveals no gallop and no friction rub.   No murmur heard. Pulmonary/Chest: Effort normal and breath sounds normal. No respiratory distress. He has no wheezes. He has no rales.  Abdominal: Soft. He exhibits no distension. There is no tenderness. There is no guarding.  Musculoskeletal: He exhibits no edema.       Right elbow: He exhibits decreased range of motion, swelling and  effusion. He exhibits no deformity and no laceration. Tenderness found.       Right wrist: He exhibits normal range of motion.  Neurological: He is alert and oriented to person, place, and time.  Skin: Skin is warm and dry. He is not diaphoretic.  Nursing note and vitals reviewed.    ED Treatments / Results  Labs (all labs ordered are listed, but only abnormal results are displayed) Labs Reviewed  CBC WITH DIFFERENTIAL/PLATELET - Abnormal; Notable for the following:       Result Value   RBC 3.95 (*)    Hemoglobin 11.4 (*)    HCT 34.8 (*)    All other components within normal limits  COMPREHENSIVE METABOLIC PANEL - Abnormal; Notable for the following:    ALT 11 (*)    All other components within normal limits  SEDIMENTATION RATE - Abnormal;  Notable for the following:    Sed Rate 33 (*)    All other components within normal limits  BODY FLUID CULTURE  GRAM STAIN  C-REACTIVE PROTEIN  I-STAT CG4 LACTIC ACID, ED    EKG  EKG Interpretation None       Radiology Dg Elbow Complete Right  Result Date: 02/07/2016 CLINICAL DATA:  Pain and swelling for 3 days. No history of recent trauma. Reported history of gout EXAM: RIGHT ELBOW - COMPLETE 3+ VIEW COMPARISON:  None. FINDINGS: Frontal, lateral common bile oblique views were obtained. There is a joint effusion. No fracture or dislocation is evident. There is spurring at multiple sites. There is calcification in the posterior aspect of the joint. There is no erosive change. There is soft tissue swelling, particularly posteriorly and in the olecranon bursa region. IMPRESSION: Areas of arthropathy. Suspect tophus formation posterior to the distal humerus. There is soft tissue swelling posteriorly. There is a joint effusion. This joint effusion may well be secondary to arthropathy given the absence of trauma. No fracture or dislocation evident. No erosive change is apparent. Advise correlation with uric acid given the findings appreciable on  this study. Electronically Signed   By: Lowella Grip III M.D.   On: 02/07/2016 10:24    Procedures Procedures (including critical care time)  Medications Ordered in ED Medications  sodium chloride 0.9 % bolus 1,000 mL (0 mLs Intravenous Stopped 02/07/16 1109)  lidocaine-EPINEPHrine (XYLOCAINE W/EPI) 2 %-1:200000 (PF) injection 10 mL (10 mLs Intradermal Given by Other 02/07/16 1323)  HYDROcodone-acetaminophen (NORCO/VICODIN) 5-325 MG per tablet 1 tablet (1 tablet Oral Given 02/07/16 1322)  ketorolac (TORADOL) 30 MG/ML injection 30 mg (30 mg Intravenous Given 02/07/16 1322)     Initial Impression / Assessment and Plan / ED Course  I have reviewed the triage vital signs and the nursing notes.  Pertinent labs & imaging results that were available during my care of the patient were reviewed by me and considered in my medical decision making (see chart for details).  Clinical Course   50 year old male with a history of malignant melanoma of the great toe, SVT, chronic diastolic heart failure, adrenal insufficiency, gout, presents with concern for right elbow pain. Patient's blood pressure in triage was 83/56, with initial concern of low blood pressure secondary to decreased by mouth intake, versus possible sepsis from a septic elbow infection.  Patient without any other infectious symptoms. Denies black or bloody stools. Family does report he has not been eating or drinking as much recently and has had depression. Pt denies SI.   Initially ordered blood cultures, fluids, and vancomycin. However, on further evaluation after initial triage blood pressure, blood pressures in high 90 systolic, and it appears they have been this way prior emergency department visits as well as outpatient follow-up visits, and it appears this is his baseline.  However, given decreased po intake, given 1L of NS.  Blood pressure at baseline now will hold on further evaluation. Hgb at baseline. No leukocytosis. Lactate  WNL.  XR shows signs of gout.    Given initial difficulty with passive ROM, attempted elbow arthrocentesis to evaluate for septic joint, however unable to obtain fluid. Given difficulty with arthrocentesis, have low suspicion for large purulent effusion, and also no erythema, no WBC, normal CRP, no fever, baseline BP, history of gout with gout findings on XR, will hold off on further attempts for arthrocentesis at this time, and treat patient empirically for gout with strict return precautions.  Patient and  wife feel comfortable with this. Given colchicine, indomethacin and percocet for breakthrough pain. Will provide orthopedic number for follow up if continuing pain/swelling.  Recommend PCP/psychiatry follow up for depression and decreased appetite.  Patient discharged in stable condition with understanding of reasons to return.   Final Clinical Impressions(s) / ED Diagnoses   Final diagnoses:  Right elbow pain  Acute gout of right elbow, unspecified cause    New Prescriptions Discharge Medication List as of 02/07/2016  1:10 PM    START taking these medications   Details  colchicine 0.6 MG tablet Take 1 tablet (0.6 mg total) by mouth daily. Take 2 tablets (1.2mg ) at first sign of flare, followed by 1 tablet (.6mg ) 1 hour later.  Take .6mg  daily for prevention of flares., Starting Sun 02/07/2016, Print    indomethacin (INDOCIN) 25 MG capsule Take 1 capsule (25 mg total) by mouth 3 (three) times daily as needed., Starting Sun 02/07/2016, Print    oxyCODONE-acetaminophen (PERCOCET/ROXICET) 5-325 MG tablet Take 2 tablets by mouth every 4 (four) hours as needed for severe pain., Starting Sun 02/07/2016, Print         Gareth Morgan, MD 02/07/16 2230

## 2016-02-17 ENCOUNTER — Ambulatory Visit: Payer: BLUE CROSS/BLUE SHIELD | Admitting: Neurology

## 2016-02-18 ENCOUNTER — Telehealth: Payer: Self-pay | Admitting: *Deleted

## 2016-02-18 NOTE — Telephone Encounter (Signed)
Received call from Patient wife Hoyle Sauer who asked to be transferred to [facility] because it is closer.  REquested Dow Adolph.  Dr. Marin Olp fine with this.  He will call to set up.

## 2016-03-04 DIAGNOSIS — C4371 Malignant melanoma of right lower limb, including hip: Secondary | ICD-10-CM | POA: Diagnosis not present

## 2016-03-25 ENCOUNTER — Encounter: Payer: Self-pay | Admitting: Neurology

## 2016-03-25 ENCOUNTER — Ambulatory Visit (INDEPENDENT_AMBULATORY_CARE_PROVIDER_SITE_OTHER): Payer: BLUE CROSS/BLUE SHIELD | Admitting: Neurology

## 2016-03-25 VITALS — BP 108/60 | HR 64 | Ht 72.0 in | Wt 193.0 lb

## 2016-03-25 DIAGNOSIS — G620 Drug-induced polyneuropathy: Secondary | ICD-10-CM | POA: Diagnosis not present

## 2016-03-25 DIAGNOSIS — M62838 Other muscle spasm: Secondary | ICD-10-CM

## 2016-03-25 DIAGNOSIS — R292 Abnormal reflex: Secondary | ICD-10-CM

## 2016-03-25 DIAGNOSIS — T451X5A Adverse effect of antineoplastic and immunosuppressive drugs, initial encounter: Secondary | ICD-10-CM

## 2016-03-25 NOTE — Progress Notes (Signed)
Follow-up Visit   Date: 03/25/16    James Byrd MRN: [id number] DOB: [birth year]   Interim History: James Byrd is a 50 y.o. right-handed Caucasian male with stage IIIB right great toe melanoma s/p amputation and adjuvant chemotheray, SVT, hyperlipidemia, depression, and GERD returning to the clinic for follow-up of bilateral leg weakness and numbness.  The patient was accompanied to the clinic by wife who also provides collateral information.    History of present illness: In 2015, he noticed discoloration of this right great toe nailbed which was later biopsied by podiatry and showed malignant melanoma. He went to [facility] where the surgeon amputated the great toe and pathology results showed sentinel lymph node was positive. There was no evidence of metastatic disease on CT. He was recommended to have adjuvant chemotherapy with Curt Bears, which he started in June 2016. Following his first cycle, he developed polyarthralgia with swelling which was managed with decadron. He had a second cycle in July and soon developed depressive symptoms so started on Lexapro. After the third dose, he was hospitalized for acute respiratory failure due to drug-induced pneurmonia and again for sepsis in August - September 2016. Since this time, he has developed generalized weakness of the arms and legs. He denies any low back pain, bowel/bladder dysfunction. He also endorses numbness of the hands which started late fall 2016. He is most bothered by leg stiffness. He had great difficulty with standing up, especially after sitting for prolonged period of time. His legs feel stiff, weak, and tired. Since early 2017, he feels that his symptoms have plateaued.  He has associated 70lb weight loss and endorses lack of appetite. He spends all day sitting on the coach. He does not eat meals unless his wife actively puts the food in front of him and only eats one meal per day. They have cans of ensure at  home, but he does not drink it. He endorses very low mood and lack of motivation to do anything. He used to enjoy gardening, but with his leg stiffness and weakness, he does not have energy to do anything.  UPDATE 11/19/2015:  Patient arrived to his appointment 3 hours late, thinking it was at 1pm when he was scheduled for 11am.  Since his last visit, he has started water therapy and noticed that his walking is a little better.  He is taking baclofen 5mg  twice daily and has not noticed a huge difference. He denies increased sedation with the medication.  He continues to complain of bilateral leg numbness which is always triggered by sitting for greater than a few minutes.  He feels as if his legs fall asleep and if he does not stand up, it continues to worsen.  When he is standing or laying flat, there is no numbness.  His wife feels that he is pinching a nerve.  His MRI cervical, thoracic, and lumbar spine does not show any evidence of metastatic disease.  He has mild canal stenosis at C5-6, but nothing to explain his myelopathic findings.   UPDATE 01/18/2016:  Patient requested sooner appointment due to worsening numbness/tingling of the legs.  He is currently taking gabapentin 900mg  BID which he is tolerating.  He continues to have marked difficulty with leg stiffness and nearly always has to flex his legs when resting/sitting.  His weight has been stable.  Mood remains fair.  No new complaints.   UPDATE 03/25/2016:  Patient is here for 3 month follow-up visit.  He is doing well  and does not have any new neurological complaints.  He started walking about 4 miles per day and noticed that legs were feeling much better when doing this.  He was also able to reduce his gabapentin to 900mg  twice daily, previously taking it TID.  Mood is good.  He had a flare with gout and has been placed on diet restriction and had requested regarding appropriate foods, but I recommended that he talk to his PCP.  Medications:    Current Outpatient Prescriptions on File Prior to Visit  Medication Sig Dispense Refill  . Ascorbic Acid (VITAMIN C) 1000 MG tablet Take 1,000 mg by mouth daily.    Marland Kitchen aspirin 81 MG tablet Take 81 mg by mouth daily.    Marland Kitchen augmented betamethasone dipropionate (DIPROLENE-AF) 0.05 % cream Apply 1 application topically 2 (two) times daily as needed.     . Cholecalciferol (VITAMIN D) 2000 units tablet Take 2,000 Units by mouth daily.    Marland Kitchen gabapentin (NEURONTIN) 300 MG capsule Take 3 capsules (900 mg total) by mouth 2 (two) times daily. (Patient taking differently: Take 600 mg by mouth 2 (two) times daily. ) 540 capsule 3  . ibuprofen (ADVIL,MOTRIN) 200 MG tablet Take 800 mg by mouth every 8 (eight) hours as needed.    . metoprolol succinate (TOPROL-XL) 50 MG 24 hr tablet Take 50 mg by mouth at bedtime. Take with or immediately following a meal.     . colchicine 0.6 MG tablet Take 1 tablet (0.6 mg total) by mouth daily. Take 2 tablets (1.2mg ) at first sign of flare, followed by 1 tablet (.6mg ) 1 hour later.  Take .6mg  daily for prevention of flares. (Patient not taking: Reported on 03/25/2016) 30 tablet 0   Current Facility-Administered Medications on File Prior to Visit  Medication Dose Route Frequency Provider Last Rate Last Dose  . 0.9 %  sodium chloride infusion   Intravenous Continuous [name], MD 500 mL/hr at 02/05/15 1510      Allergies: No Known Allergies  Review of Systems:  CONSTITUTIONAL: No fevers, chills, night sweats, or weight loss.  EYES: No visual changes or eye pain ENT: No hearing changes.  No history of nose bleeds.   RESPIRATORY: No cough, wheezing and shortness of breath.   CARDIOVASCULAR: Negative for chest pain, and palpitations.   GI: Negative for abdominal discomfort, blood in stools or black stools.  No recent change in bowel habits.   GU:  No history of incontinence.   MUSCLOSKELETAL: No history of joint pain or swelling.  No myalgias.   SKIN: Negative for  lesions, rash, and itching.   ENDOCRINE: Negative for cold or heat intolerance, polydipsia or goiter.   PSYCH:  No depression or anxiety symptoms.   NEURO: As Above.   Vital Signs:  BP 108/60   Pulse 64   Ht 6' (1.829 m)   Wt 193 lb (87.5 kg)   BMI 26.18 kg/m   Neurological Exam: MENTAL STATUS including orientation to time, place, person, recent and remote memory, attention span and concentration, language, and fund of knowledge is normal.  Speech is not dysarthric. Blunted affect, but he did smile and laugh appropriately.  He is sitting comfortably in the chair.  CRANIAL NERVES:  Face is symmetric.   MOTOR:  Motor strength is 5/5 in all extremities, except bilateral dorsiflexion and toe extension/flexion is 4/5.  No atrophy, fasciculations or abnormal movements.  No pronator drift.  Tone is only mildly increased in the legs  MSRs:  Reflexes are 2+/4 throughout, except 3+ bilateral patella with crossed adductors.   SENSORY:  Intact to vibration  COORDINATION/GAIT:  Gait appears normal, except slightly stooped posture and poor arm swing on the right.  No spastic nature to his gait.   Data: MRI brain wwo contrast 06/16/2015: Negative  MRI brain wwo contrast 03/10/2015:  1. No acute intracranial abnormality. 2. Extensive paranasal sinus mucosal disease, correlate clinically for acute sinusitis. 3. Left larger than right mastoid effusions.  MRI thoracic spine wwo contrast 10/29/2015: Normal.  MRI cercical spine wwo contrast 11/11/2015: 1.  No acute findings to explain patient's symptoms.  No evidence of myelopathy or cord compression. 2.  Spondylosis at C5-6 contributes to borderline spinal stenosis and moderate foraminal stenosis bilaterally.  There is potential encroachment of the C6 nerve root. 3.  Mild left foraminal narrowing at C6-7. 4.  Sinusitis  MRI lumbar spine wwo contrast 10/16/2015:   1.  No areas of metastatic disease of the lumbar spine. 2.  At L4-5 there is a  mild broad based disc bulge with a right lateral disc protrusion abutting the right extra foraminal L4 nerve root.  Bilateral lateral recess stenosis.  Mild bilateral facet arthropathy.  Labs 10/09/2015:  Vitamin B12 601, vitamin B1 11, copper 98, zinc 89, ceruloplasmin 25, AChR antibody neg Labs 01/18/2016:  GAD neg  NCS/EMG of the legs 12/08/2015:  The electrophysiologic findings are most consistent with a chronic and symmetric sensorimotor polyneuropathy, axonal loss and demyelinating in type, affecting the lower extremities.  IMPRESSION/PLAN: 1. Bilateral leg spasticity - unclear etiology.  Clinically, he is looking the best that I have seen him and seems to be more active at home, too.  Exam shows less spasticity even with him being off baclofen - Imaging of the entire neuroaxis is essentially normal and does not show structural changes. ?if this is due to chemotherapy side effect, but would expect to see signal changes of the cord.    - Myelopathy labs, including GAD antibody are also normal - Continue home exercises for leg stretching  - Praised him for his exercise regimen and encouraged him to continue walking  2. Bilateral leg numbness and pain - improved and well controlled on gabapentin.  EMG showed sensorimotor polyneuropathy affecting the feet, axonal and demyelinating  - Reduce to gabapentin to 900mg  BID since his pain is well controlled.  He may continue to taper by 300mg  each week to a level that controls pain  3. History of melanoma affecting the right hallux s/p amputation and adjuvant therapy with Yervoy.  Literature search shows case reports of associated myasthenia, axonal motor neuropathy, and GBS-like syndrome with Yervoy, but Mr. Mcnab has myelopathy  Return to clinic in 6 months  The duration of this appointment visit was 25 minutes of face-to-face time with the patient.  Greater than 50% of this time was spent in counseling, explanation of  diagnosis, planning of further management, and coordination of care.   Thank you for allowing me to participate in patient's care.  If I can answer any additional questions, I would be pleased to do so.    Sincerely,    Donika K. Posey Pronto, DO

## 2016-03-25 NOTE — Patient Instructions (Addendum)
1.  Continue gabapentin as you are taking.  If you choose to reduce the dose, taper by 300mg  every week 2.  Excellent work with staying active!  Keep it up 3.  Return to clinic in 6 months

## 2016-06-23 ENCOUNTER — Telehealth: Payer: Self-pay | Admitting: Neurology

## 2016-06-23 NOTE — Telephone Encounter (Signed)
PT called and said he is in a lot of pain and has bulging veins and needs a call back/Dawn CB# [phone]

## 2016-06-23 NOTE — Telephone Encounter (Signed)
Please advise 

## 2016-06-24 NOTE — Telephone Encounter (Signed)
Recommend that he is evaluated by his primary care provider for these complaints.  Chidera Thivierge K. Posey Pronto, DO

## 2016-06-24 NOTE — Telephone Encounter (Signed)
Left message with patient's wife advising them to go to PCP for these issues.

## 2016-06-24 NOTE — Telephone Encounter (Signed)
Dr Patel patient.  

## 2016-08-08 ENCOUNTER — Other Ambulatory Visit (HOSPITAL_BASED_OUTPATIENT_CLINIC_OR_DEPARTMENT_OTHER): Payer: BLUE CROSS/BLUE SHIELD

## 2016-08-08 ENCOUNTER — Ambulatory Visit (HOSPITAL_BASED_OUTPATIENT_CLINIC_OR_DEPARTMENT_OTHER): Payer: BLUE CROSS/BLUE SHIELD | Admitting: Family

## 2016-08-08 DIAGNOSIS — G629 Polyneuropathy, unspecified: Secondary | ICD-10-CM | POA: Diagnosis not present

## 2016-08-08 DIAGNOSIS — C4371 Malignant melanoma of right lower limb, including hip: Secondary | ICD-10-CM | POA: Diagnosis not present

## 2016-08-08 LAB — COMPREHENSIVE METABOLIC PANEL (CC13)
ALT: 15 IU/L (ref 0–44)
AST (SGOT): 19 IU/L (ref 0–40)
Albumin, Serum: 4.1 g/dL (ref 3.5–5.5)
Albumin/Globulin Ratio: 1.6 (ref 1.2–2.2)
Alkaline Phosphatase, S: 101 IU/L (ref 39–117)
BILIRUBIN TOTAL: 0.4 mg/dL (ref 0.0–1.2)
BUN/Creatinine Ratio: 16 (ref 9–20)
BUN: 14 mg/dL (ref 6–24)
CHLORIDE: 108 mmol/L — AB (ref 96–106)
Calcium, Ser: 9.6 mg/dL (ref 8.7–10.2)
Carbon Dioxide, Total: 26 mmol/L (ref 18–29)
Creatinine, Ser: 0.86 mg/dL (ref 0.76–1.27)
GFR calc non Af Amer: 101 mL/min/{1.73_m2} (ref >59)
GFR, EST AFRICAN AMERICAN: 117 mL/min/{1.73_m2} (ref >59)
GLUCOSE: 83 mg/dL (ref 65–99)
Globulin, Total: 2.5 g/dL (ref 1.5–4.5)
Potassium, Ser: 3.9 mmol/L (ref 3.5–5.2)
Sodium: 140 mmol/L (ref 134–144)
TOTAL PROTEIN: 6.6 g/dL (ref 6.0–8.5)

## 2016-08-08 LAB — CBC WITH DIFFERENTIAL (CANCER CENTER ONLY)
BASO#: 0 10*3/uL (ref 0.0–0.2)
BASO%: 0.4 % (ref 0.0–2.0)
EOS ABS: 0.2 10*3/uL (ref 0.0–0.5)
EOS%: 3.7 % (ref 0.0–7.0)
HCT: 40.5 % (ref 38.7–49.9)
HGB: 14 g/dL (ref 13.0–17.1)
LYMPH#: 2.2 10*3/uL (ref 0.9–3.3)
LYMPH%: 42 % (ref 14.0–48.0)
MCH: 30.1 pg (ref 28.0–33.4)
MCHC: 34.6 g/dL (ref 32.0–35.9)
MCV: 87 fL (ref 82–98)
MONO#: 0.4 10*3/uL (ref 0.1–0.9)
MONO%: 7.6 % (ref 0.0–13.0)
NEUT#: 2.4 10*3/uL (ref 1.5–6.5)
NEUT%: 46.3 % (ref 40.0–80.0)
PLATELETS: 196 10*3/uL (ref 145–400)
RBC: 4.65 10*6/uL (ref 4.20–5.70)
RDW: 12.9 % (ref 11.1–15.7)
WBC: 5.1 10*3/uL (ref 4.0–10.0)

## 2016-08-08 NOTE — Progress Notes (Signed)
Hematology and Oncology Follow Up Visit  Lynell Kussman [id number] [birth year] 51 y.o. 08/08/2016   Principle Diagnosis:  Stage IIIB (T3bN1aM0) subungual melanoma of the right hallux  Current Therapy:   Yervoy-status post 3 cycles - discontinued in September 2016 secondary to toxicity    Interim History: Mr. Bleier is here today with his wife for a follow-up. He looks much better since we last saw him. He has gained 25 lbs and his appetite continues to improve. He is staying well hydrated. He does have some intermittent fatigue.  He is going to the gym several times during the week with his wife and best friend. Unfortunately, he still has neuropathy in both lower extremities which has caused him a good deal of weakness. He also has some neuropathy in his hands as well in the middle, ring and pinky fingers of both hands. He is taking Neurontin 900 mg PO BID. This does give him some relief. He is also followed by Dr. Posey Pronto with Neurology.  No swelling in his extremities.  He has had no falls or syncopal episodes.  No fever, chills, n/v, cough, rash, dizziness, chest pain, palpitation, abdominal pain or changes in bowel or bladder habits.  He has occasional SOB with SVT and states that he will follow-up with cardiology if this persists. He was recently changed from Diprolene to Toprol which seems to be helping.  His mood continues to improve. He still struggles with not being able to work. He states that he has a good support system and talks a lot with his wife and best friend. He has a strong faith and denies feelings of self harm.   Medications:  Allergies as of 08/08/2016   No Known Allergies     Medication List       Accurate as of 08/08/16  2:14 PM. Always use your most recent med list.          aspirin 81 MG tablet Take 81 mg by mouth daily.   augmented betamethasone dipropionate 0.05 % cream Commonly known as:  DIPROLENE-AF Apply 1 application topically 2 (two) times daily  as needed.   colchicine 0.6 MG tablet Take 1 tablet (0.6 mg total) by mouth daily. Take 2 tablets (1.2mg ) at first sign of flare, followed by 1 tablet (.6mg ) 1 hour later.  Take .6mg  daily for prevention of flares.   gabapentin 300 MG capsule Commonly known as:  NEURONTIN Take 3 capsules (900 mg total) by mouth 2 (two) times daily.   ibuprofen 200 MG tablet Commonly known as:  ADVIL,MOTRIN Take 800 mg by mouth every 8 (eight) hours as needed.   metoprolol succinate 50 MG 24 hr tablet Commonly known as:  TOPROL-XL Take 50 mg by mouth at bedtime. Take with or immediately following a meal.   vitamin C 1000 MG tablet Take 1,000 mg by mouth daily.   Vitamin D 2000 units tablet Take 2,000 Units by mouth daily.       Allergies: No Known Allergies  Past Medical History, Surgical history, Social history, and Family History were reviewed and updated.  Review of Systems: All other 10 point review of systems is negative.   Physical Exam:  vitals were not taken for this visit.  Wt Readings from Last 3 Encounters:  03/25/16 193 lb (87.5 kg)  02/07/16 188 lb 4.8 oz (85.4 kg)  01/18/16 192 lb (87.1 kg)    Ocular: Sclerae unicteric, pupils equal, round and reactive to light Ear-nose-throat: Oropharynx clear, dentition fair Lymphatic: No  cervical supraclavicular or axillary adenopathy Lungs no rales or rhonchi, good excursion bilaterally Heart regular rate and rhythm, no murmur appreciated Abd soft, nontender, positive bowel sounds, no liver or spleen tip palpated on exam, no fluid wave MSK no focal spinal tenderness, no joint edema Neuro: non-focal, well-oriented, appropriate affect Breasts: Deferred  Lab Results  Component Value Date   WBC 5.1 08/08/2016   HGB 14.0 08/08/2016   HCT 40.5 08/08/2016   MCV 87 08/08/2016   PLT 196 08/08/2016   Lab Results  Component Value Date   FERRITIN 363 (H) 09/04/2015   IRON 55 09/04/2015   TIBC 200 (L) 09/04/2015   UIBC 145  09/04/2015   IRONPCTSAT 28 09/04/2015   Lab Results  Component Value Date   RBC 4.65 08/08/2016   No results found for: KPAFRELGTCHN, LAMBDASER, KAPLAMBRATIO No results found for: IGGSERUM, IGA, IGMSERUM No results found for: Odetta Pink, SPEI   Chemistry      Component Value Date/Time   NA 138 02/07/2016 0945   NA 141 10/16/2015 1334   K 4.1 02/07/2016 0945   K 3.9 10/16/2015 1334   CL 109 02/07/2016 0945   CL 101 02/05/2015 1339   CO2 22 02/07/2016 0945   CO2 21 (L) 10/16/2015 1334   BUN 20 02/07/2016 0945   BUN 20.0 10/16/2015 1334   CREATININE 0.99 02/07/2016 0945   CREATININE 1.0 10/16/2015 1334      Component Value Date/Time   CALCIUM 9.5 02/07/2016 0945   CALCIUM 9.7 10/16/2015 1334   ALKPHOS 63 02/07/2016 0945   ALKPHOS 62 10/16/2015 1334   AST 20 02/07/2016 0945   AST 16 10/16/2015 1334   ALT 11 (L) 02/07/2016 0945   ALT 11 10/16/2015 1334   BILITOT 0.7 02/07/2016 0945   BILITOT 0.49 10/16/2015 1334      Impression and Plan: Mr. Truxillo is 51 yo gentleman with a stage IIIB melanoma of the right hallux that was subungual. He had a microscopic positive inguinal lymph node. He completed 3 cycles or Yervoy but stopped due to side effects. He still has severe neuropathy in both lower extremities which has improved some what with Neurontin and exercise. He is followed closely by Neurology and sees Dr. Posey Pronto again in April.  He cancelled several follow-up appointments as well as his CT scans planned for 6 weeks after his last visit. We will reschedule these for next week.  We will go ahead and plan to see him back in 4 months for follow-up.  Both he and his wife know to contact our office with any questions or concerns. We can certainly see him sooner if need be.   Eliezer Bottom, NP 2/19/20182:14 PM

## 2016-08-09 LAB — LACTATE DEHYDROGENASE: LDH: 145 U/L (ref 125–245)

## 2016-08-16 ENCOUNTER — Ambulatory Visit (HOSPITAL_BASED_OUTPATIENT_CLINIC_OR_DEPARTMENT_OTHER)
Admission: RE | Admit: 2016-08-16 | Discharge: 2016-08-16 | Disposition: A | Discharge status: Home / Self Care | Payer: BLUE CROSS/BLUE SHIELD | Source: Ambulatory Visit | Attending: Family | Admitting: Family

## 2016-08-16 ENCOUNTER — Telehealth: Payer: Self-pay | Admitting: Family

## 2016-08-16 ENCOUNTER — Encounter (HOSPITAL_BASED_OUTPATIENT_CLINIC_OR_DEPARTMENT_OTHER): Payer: Self-pay

## 2016-08-16 DIAGNOSIS — C4371 Malignant melanoma of right lower limb, including hip: Secondary | ICD-10-CM

## 2016-08-16 DIAGNOSIS — R590 Localized enlarged lymph nodes: Secondary | ICD-10-CM | POA: Diagnosis not present

## 2016-08-16 DIAGNOSIS — N289 Disorder of kidney and ureter, unspecified: Secondary | ICD-10-CM | POA: Diagnosis not present

## 2016-08-16 DIAGNOSIS — I700 Atherosclerosis of aorta: Secondary | ICD-10-CM | POA: Insufficient documentation

## 2016-08-16 DIAGNOSIS — M47816 Spondylosis without myelopathy or radiculopathy, lumbar region: Secondary | ICD-10-CM | POA: Insufficient documentation

## 2016-08-16 DIAGNOSIS — M5136 Other intervertebral disc degeneration, lumbar region: Secondary | ICD-10-CM | POA: Insufficient documentation

## 2016-08-16 MED ORDER — IOPAMIDOL (ISOVUE-300) INJECTION 61%
100.0000 mL | Freq: Once | INTRAVENOUS | Status: AC | PRN
  Administered 2016-08-16: 100 mL via INTRAVENOUS

## 2016-08-16 NOTE — Telephone Encounter (Signed)
Message left on patients personal.

## 2016-09-30 ENCOUNTER — Ambulatory Visit: Payer: BLUE CROSS/BLUE SHIELD | Admitting: Neurology

## 2016-10-05 ENCOUNTER — Encounter: Payer: Self-pay | Admitting: Neurology

## 2016-12-09 ENCOUNTER — Other Ambulatory Visit (HOSPITAL_BASED_OUTPATIENT_CLINIC_OR_DEPARTMENT_OTHER): Payer: BLUE CROSS/BLUE SHIELD

## 2016-12-09 ENCOUNTER — Ambulatory Visit (HOSPITAL_BASED_OUTPATIENT_CLINIC_OR_DEPARTMENT_OTHER): Payer: BLUE CROSS/BLUE SHIELD | Admitting: Family

## 2016-12-09 VITALS — BP 100/67 | HR 75 | Temp 98.4°F | Resp 18 | Wt 222.0 lb

## 2016-12-09 DIAGNOSIS — C4371 Malignant melanoma of right lower limb, including hip: Secondary | ICD-10-CM

## 2016-12-09 LAB — CBC WITH DIFFERENTIAL (CANCER CENTER ONLY)
BASO#: 0 10*3/uL (ref 0.0–0.2)
BASO%: 0.5 % (ref 0.0–2.0)
EOS ABS: 0.2 10*3/uL (ref 0.0–0.5)
EOS%: 2.8 % (ref 0.0–7.0)
HEMATOCRIT: 40.5 % (ref 38.7–49.9)
HGB: 14.2 g/dL (ref 13.0–17.1)
LYMPH#: 2.3 10*3/uL (ref 0.9–3.3)
LYMPH%: 38.6 % (ref 14.0–48.0)
MCH: 30.5 pg (ref 28.0–33.4)
MCHC: 35.1 g/dL (ref 32.0–35.9)
MCV: 87 fL (ref 82–98)
MONO#: 0.6 10*3/uL (ref 0.1–0.9)
MONO%: 10.2 % (ref 0.0–13.0)
NEUT#: 2.9 10*3/uL (ref 1.5–6.5)
NEUT%: 47.9 % (ref 40.0–80.0)
Platelets: 210 10*3/uL (ref 145–400)
RBC: 4.66 10*6/uL (ref 4.20–5.70)
RDW: 13 % (ref 11.1–15.7)
WBC: 6.1 10*3/uL (ref 4.0–10.0)

## 2016-12-09 LAB — COMPREHENSIVE METABOLIC PANEL (CC13)
A/G RATIO: 1.6 (ref 1.2–2.2)
ALBUMIN: 4.5 g/dL (ref 3.5–5.5)
ALK PHOS: 89 IU/L (ref 39–117)
ALT: 14 IU/L (ref 0–44)
AST (SGOT): 18 IU/L (ref 0–40)
BILIRUBIN TOTAL: 0.5 mg/dL (ref 0.0–1.2)
BUN / CREAT RATIO: 22 — AB (ref 9–20)
BUN: 23 mg/dL (ref 6–24)
CHLORIDE: 106 mmol/L (ref 96–106)
Calcium, Ser: 9.8 mg/dL (ref 8.7–10.2)
Carbon Dioxide, Total: 21 mmol/L (ref 20–29)
Creatinine, Ser: 1.03 mg/dL (ref 0.76–1.27)
GFR calc non Af Amer: 84 mL/min/{1.73_m2} (ref >59)
GFR, EST AFRICAN AMERICAN: 97 mL/min/{1.73_m2} (ref >59)
GLUCOSE: 88 mg/dL (ref 65–99)
Globulin, Total: 2.8 g/dL (ref 1.5–4.5)
POTASSIUM: 4.5 mmol/L (ref 3.5–5.2)
SODIUM: 136 mmol/L (ref 134–144)
TOTAL PROTEIN: 7.3 g/dL (ref 6.0–8.5)

## 2016-12-09 LAB — LACTATE DEHYDROGENASE: LDH: 200 U/L (ref 125–245)

## 2016-12-09 NOTE — Progress Notes (Signed)
Hematology and Oncology Follow Up Visit  James Byrd [id number] 10/08/1965 51 y.o. 12/09/2016   Principle Diagnosis:  Stage IIIB (T3bN1aM0) subungual melanoma of the right hallux  Current Therapy:   Yervoy-status post 3 cycles - discontinued in September 2016 secondary to toxicity   Interim History:  James Byrd is here today with his best friend for follow-up. He states that he is actually starting to feel better. His neuropathy in his feet and legs seems to be slightly improved. He still has some occasional fatigue.  He was able to go on a fishing trip with his buddy recently and is trying to work on his golf game. He really is working hard.  He had an appetite of mild abdominal pain several weeks ago but this resolved after a couples days and has not returned.  He has a patch of itchy skin on his right outer ankle. There is no swelling. He has been scratching and will try putting hydrocortisone cream on this area. If does not improve over the next week he will let call and let us know. No other skin changes or lesions noted on exam.  No lymphadenopathy found on exam. No episodes of bleeding or bruising.  No fever, chills, n/v, cough, rash, dizziness, SOB, chest pain or changes in bowel or bladder habits.  He has a history of SVT and has occasional palpitations due to this. He takes metoprolol daily as prescribed.   No swelling in his extremities at this time.  He has maintained a good appetite and is staying hydrated. He has gained 29 lbs since last October.    ECOG Performance Status: 1 - Symptomatic but completely ambulatory  Medications:  Allergies as of 12/09/2016   No Known Allergies     Medication List       Accurate as of 12/09/16  2:15 PM. Always use your most recent med list.          aspirin 81 MG tablet Take 81 mg by mouth daily.   augmented betamethasone dipropionate 0.05 % cream Commonly known as:  DIPROLENE-AF Apply 1 application topically 2 (two) times  daily as needed.   gabapentin 300 MG capsule Commonly known as:  NEURONTIN Take 3 capsules (900 mg total) by mouth 2 (two) times daily.   hydrocortisone 5 MG tablet Commonly known as:  CORTEF Take 5 mg by mouth 2 (two) times daily.   metoprolol succinate 50 MG 24 hr tablet Commonly known as:  TOPROL-XL Take 50 mg by mouth at bedtime. Take with or immediately following a meal.   testosterone 50 MG/5GM (1%) Gel Commonly known as:  ANDROGEL Place 5 g onto the skin daily.   vitamin C 1000 MG tablet Take 1,000 mg by mouth daily.   Vitamin D 2000 units tablet Take 2,000 Units by mouth daily.       Allergies: No Known Allergies  Past Medical History, Surgical history, Social history, and Family History were reviewed and updated.  Review of Systems: All other 10 point review of systems is negative.   Physical Exam:  vitals were not taken for this visit.  Wt Readings from Last 3 Encounters:  03/25/16 193 lb (87.5 kg)  02/07/16 188 lb 4.8 oz (85.4 kg)  01/18/16 192 lb (87.1 kg)    Ocular: Sclerae unicteric, pupils equal, round and reactive to light Ear-nose-throat: Oropharynx clear, dentition fair Lymphatic: No cervical, supraclavicular or axillary adenopathy Lungs no rales or rhonchi, good excursion bilaterally Heart regular rate and rhythm, no murmur  appreciated Abd soft, nontender, positive bowel sounds, no liver or spleen tip palpated on exam, no fluid wave MSK no focal spinal tenderness, no joint edema Neuro: non-focal, well-oriented, appropriate affect Breasts: Deferred   Lab Results  Component Value Date   WBC 6.1 12/09/2016   HGB 14.2 12/09/2016   HCT 40.5 12/09/2016   MCV 87 12/09/2016   PLT 210 12/09/2016   Lab Results  Component Value Date   FERRITIN 363 (H) 09/04/2015   IRON 55 09/04/2015   TIBC 200 (L) 09/04/2015   UIBC 145 09/04/2015   IRONPCTSAT 28 09/04/2015   Lab Results  Component Value Date   RBC 4.66 12/09/2016   No results found for:  KPAFRELGTCHN, LAMBDASER, KAPLAMBRATIO No results found for: IGGSERUM, IGA, IGMSERUM No results found for: Odetta Pink, SPEI   Chemistry      Component Value Date/Time   NA 140 08/08/2016 1357   NA 141 10/16/2015 1334   K 3.9 08/08/2016 1357   K 3.9 10/16/2015 1334   CL 108 (H) 08/08/2016 1357   CL 101 02/05/2015 1339   CO2 26 08/08/2016 1357   CO2 21 (L) 10/16/2015 1334   BUN 14 08/08/2016 1357   BUN 20.0 10/16/2015 1334   CREATININE 0.86 08/08/2016 1357   CREATININE 1.0 10/16/2015 1334      Component Value Date/Time   CALCIUM 9.6 08/08/2016 1357   CALCIUM 9.7 10/16/2015 1334   ALKPHOS 101 08/08/2016 1357   ALKPHOS 62 10/16/2015 1334   AST 19 08/08/2016 1357   AST 16 10/16/2015 1334   ALT 15 08/08/2016 1357   ALT 11 10/16/2015 1334   BILITOT 0.4 08/08/2016 1357   BILITOT 0.49 10/16/2015 1334      Impression and Plan: James Byrd is a 51 yo caucasian gentleman with stage IIIB melanoma of the right hallux that was subungual. He had a microscopic positive inguinal lymph node. He completed 3 cycles of Yervoy but stopped due to side effects. His energy seems to be improving as well as his neuropathy. He has been able to go fishing and started golfing again. He has a great support system at home.  He is due for follow-up CT scans in August so we will set these up to be done in [place].  He will let us know f the dry itchy patch of skin on his right outer ankle bone does not improve with hydrocortisone cream.  We will plan to see him back again in 6 months for repeat lab work and follow-up. Greater than 50% of the face to face visit was spent counseling the patient and coordinating care.   Both he and his family know to contact our office with any questions or concerns. We can certainly see him sooner if need be.   Eliezer Bottom, NP 6/22/20182:15 PM

## 2017-02-08 ENCOUNTER — Encounter: Payer: Self-pay | Admitting: Neurology

## 2017-02-08 ENCOUNTER — Other Ambulatory Visit (INDEPENDENT_AMBULATORY_CARE_PROVIDER_SITE_OTHER): Payer: BLUE CROSS/BLUE SHIELD

## 2017-02-08 ENCOUNTER — Ambulatory Visit (INDEPENDENT_AMBULATORY_CARE_PROVIDER_SITE_OTHER): Payer: BLUE CROSS/BLUE SHIELD | Admitting: Neurology

## 2017-02-08 VITALS — BP 100/68 | HR 65 | Ht 72.0 in | Wt 224.4 lb

## 2017-02-08 DIAGNOSIS — T451X5A Adverse effect of antineoplastic and immunosuppressive drugs, initial encounter: Secondary | ICD-10-CM

## 2017-02-08 DIAGNOSIS — G620 Drug-induced polyneuropathy: Secondary | ICD-10-CM

## 2017-02-08 DIAGNOSIS — R292 Abnormal reflex: Secondary | ICD-10-CM | POA: Diagnosis not present

## 2017-02-08 LAB — TSH: TSH: 3.09 u[IU]/mL (ref 0.35–4.50)

## 2017-02-08 LAB — VITAMIN B12: Vitamin B-12: 271 pg/mL (ref 211–911)

## 2017-02-08 MED ORDER — GABAPENTIN 300 MG PO CAPS: 600.0000 mg | ORAL_CAPSULE | Freq: Three times a day (TID) | ORAL | 3 refills | Status: DC

## 2017-02-08 NOTE — Patient Instructions (Addendum)
Check labs Adjust gabapentin 600mg  three times daily Start a home exercise program and start walking at least 15-30 minutes daily.    Return to clinic in 9 months

## 2017-02-08 NOTE — Progress Notes (Signed)
Follow-up Visit   Date: 02/08/17    James Byrd MRN: [id number] DOB: 01/04/1966   Interim History: James Byrd is a 51 y.o. right-handed Caucasian male with stage IIIB right great toe melanoma s/p amputation and adjuvant chemotheray, SVT, hyperlipidemia, depression, and GERD returning to the clinic for follow-up of peripheral neuropathy.  The patient was accompanied to the clinic by friend who also provides collateral information.    History of present illness: In 2015, he noticed discoloration of this right great toe nailbed which found to be malignant melanoma. There was no evidence of metastatic disease on CT. He started adjuvant chemotherapy with Curt Bears in June 2016. Following his first cycle, he developed polyarthralgia with swelling which was managed with decadron. Following his second cycle in July, he developed depressive symptoms and put on Lexapro. After the third dose, he was hospitalized for acute respiratory failure due to drug-induced pneurmonia and again for sepsis in August - September 2016. Since this time, he has developed generalized weakness of the arms and legs.He also endorses numbness of the hands which started late fall 2016. He is most bothered by leg stiffness. He had great difficulty with standing up, especially after sitting for prolonged period of time. His legs feel stiff, weak, and tired. Since early 2017, he feels that his symptoms have plateaued.  He has associated 70lb weight loss and endorses lack of appetite. He spends all day sitting on the coach. He does not eat meals unless his wife actively puts the food in front of him and only eats one meal per day. They have cans of ensure at home, but he does not drink it. He endorses very low mood and lack of motivation to do anything. He used to enjoy gardening, but with his leg stiffness and weakness, he does not have energy to do anything.  At his initial visit, he was noted to have  hyperreflexia and prominent spasticity of the legs and started on muscle relaxants.  MRI cervical, thoracic, and lumbar spine does not show any evidence of metastatic disease.  He has mild canal stenosis at C5-6, but nothing to explain his myelopathic findings. NCS/EMG showed sensorimotor polyneuropathy affecting the feet, axonal and demyelinating.  He was started on gabapentin for pain control.   UPDATE 03/25/2016:  Patient is here for 3 month follow-up visit.  He is doing well and does not have any new neurological complaints.  He started walking about 4 miles per day and noticed that legs were feeling much better when doing this.  He was also able to reduce his gabapentin to 900mg  twice daily, previously taking it TID.  Mood is good.  He had a flare with gout and has been placed on diet restriction and had requested regarding appropriate foods, but I recommended that he talk to his PCP.  UPDATE 02/08/2017:  Over the past year, he has gained weight and is starting to be more active.  He is mowing his lawn, played golf this summer, and tries to do more around the home. He is still not able to function at his normal level and his friend states that he has to be motivated a lot to perform tasks/activities. His mood is better, but he can have fluctuating good days and bad days.  He continues to have burning pain in the feet which responds to gabapentin, but because of morning sedation, reduced the nighttime dose to 600mg .  No new complaints today.   Medications:  Current Outpatient Prescriptions on File  Prior to Visit  Medication Sig Dispense Refill  . Ascorbic Acid (VITAMIN C) 1000 MG tablet Take 1,000 mg by mouth daily.    Marland Kitchen aspirin 81 MG tablet Take 81 mg by mouth daily.    Marland Kitchen augmented betamethasone dipropionate (DIPROLENE-AF) 0.05 % cream Apply 1 application topically 2 (two) times daily as needed.     . Cholecalciferol (VITAMIN D) 2000 units tablet Take 2,000 Units by mouth daily.    . hydrocortisone  (CORTEF) 5 MG tablet Take 5 mg by mouth 2 (two) times daily.    . metoprolol succinate (TOPROL-XL) 50 MG 24 hr tablet Take 50 mg by mouth at bedtime. Take with or immediately following a meal.     . testosterone (ANDROGEL) 50 MG/5GM (1%) GEL Place 5 g onto the skin daily.     Current Facility-Administered Medications on File Prior to Visit  Medication Dose Route Frequency Provider Last Rate Last Dose  . 0.9 %  sodium chloride infusion   Intravenous Continuous [name], MD 500 mL/hr at 02/05/15 1510      Allergies: No Known Allergies  Review of Systems:  CONSTITUTIONAL: No fevers, chills, night sweats, or weight loss.  EYES: No visual changes or eye pain ENT: No hearing changes.  No history of nose bleeds.   RESPIRATORY: No cough, wheezing and shortness of breath.   CARDIOVASCULAR: Negative for chest pain, and palpitations.   GI: Negative for abdominal discomfort, blood in stools or black stools.  No recent change in bowel habits.   GU:  No history of incontinence.   MUSCLOSKELETAL: No history of joint pain or swelling.  No myalgias.   SKIN: Negative for lesions, rash, and itching.   ENDOCRINE: Negative for cold or heat intolerance, polydipsia or goiter.   PSYCH:  No depression or anxiety symptoms.   NEURO: As Above.   Vital Signs:  BP 100/68   Pulse 65   Ht 6' (1.829 m)   Wt 224 lb 6 oz (101.8 kg)   SpO2 94%   BMI 30.43 kg/m   Neurological Exam: MENTAL STATUS including orientation to time, place, person, recent and remote memory, attention span and concentration, language, and fund of knowledge is normal.  Speech is not dysarthric. Blunted affect, smiles appropriately.    CRANIAL NERVES:  Pupils round and reactive.  Extraocular muscles intact.  Face is symmetric.   MOTOR:  Motor strength is 5/5 in all extremities, except bilateral dorsiflexion and toe extension/flexion is 4/5.   Tone is only mildly increased in the legs  MSRs:  Reflexes are 2+/4 throughout, except 3+  bilateral patella with crossed adductors.   SENSORY:  Intact to vibration throughout  COORDINATION/GAIT:  Gait appears normal, except slightly stooped posture and poor arm swing on the right.     Data: MRI brain wwo contrast 06/16/2015: Negative  MRI brain wwo contrast 03/10/2015:  1. No acute intracranial abnormality. 2. Extensive paranasal sinus mucosal disease, correlate clinically for acute sinusitis. 3. Left larger than right mastoid effusions.  MRI thoracic spine wwo contrast 10/29/2015: Normal.  MRI cercical spine wwo contrast 11/11/2015: 1.  No acute findings to explain patient's symptoms.  No evidence of myelopathy or cord compression. 2.  Spondylosis at C5-6 contributes to borderline spinal stenosis and moderate foraminal stenosis bilaterally.  There is potential encroachment of the C6 nerve root. 3.  Mild left foraminal narrowing at C6-7. 4.  Sinusitis  MRI lumbar spine wwo contrast 10/16/2015:   1.  No areas of metastatic disease  of the lumbar spine. 2.  At L4-5 there is a mild broad based disc bulge with a right lateral disc protrusion abutting the right extra foraminal L4 nerve root.  Bilateral lateral recess stenosis.  Mild bilateral facet arthropathy.  Labs 10/09/2015:  Vitamin B12 601, vitamin B1 11, copper 98, zinc 89, ceruloplasmin 25, AChR antibody neg Labs 01/18/2016:  GAD neg  NCS/EMG of the legs 12/08/2015:  The electrophysiologic findings are most consistent with a chronic and symmetric sensorimotor polyneuropathy, axonal loss and demyelinating in type, affecting the lower extremities.  IMPRESSION/PLAN: Axonal and demyelinating peripheral neuropathy affecting the legs following chemotherapy with Yervoy for melanoma of the right hallux s/p amputation.  Initially, he also was noted to have myelopathic on exam and his neuroaxis imaging did not show any structural changes.  Since this has improved over the past year, I suspect this may be a medication-induced  myelopathy.  On exam, he only has mild spasticity.  Clinically, he is doing much better than I have seen him previously and is able to do more activities.  His neuropathic pain continues to be the primary issue, so I will adjust his gabapentin to 600mg  three times daily.  I have recommended that he start home exercise and walking program to help with his deconditioning.  Check TSH and B12 for his fatigue.  Return to clinic in 9 months  The duration of this appointment visit was 25  minutes of face-to-face time with the patient.  Greater than 50% of this time was spent in counseling, explanation of diagnosis, planning of further management, and coordination of care.   Thank you for allowing me to participate in patient's care.  If I can answer any additional questions, I would be pleased to do so.    Sincerely,    Kiela Shisler K. Posey Pronto, DO

## 2017-02-09 ENCOUNTER — Telehealth: Payer: Self-pay

## 2017-02-09 NOTE — Telephone Encounter (Signed)
-----   Message from Alda Berthold, DO sent at 02/09/2017  8:04 AM EDT ----- Please inform patient his thyroid is normal, but B12 is low-normal.  Start OTC vitamin B12 1063mcg daily. Thanks.

## 2017-02-09 NOTE — Telephone Encounter (Signed)
LM for Pt to return call

## 2017-02-10 NOTE — Telephone Encounter (Signed)
Pt rtrnd call, LM on my VM. Called and spoke with wife, Hoyle Sauer, Pt is at the gym. Advsd her thyroid levels are normal, B12 is low. Dr Posey Pronto recommended OTC B12 1030mcg daily. Wife stated Pt had been taking, had recently ran out and forgotten to get more. She will give Pt message and make sure he restarts B12

## 2017-02-13 IMAGING — DX DG CHEST 1V PORT
1 series · 1 of 1 positions shown · non-contrast
Comparison: None.

CLINICAL DATA: Shortness of breath, nausea and weakness

EXAM:
PORTABLE CHEST - 1 VIEW

[chest ap]
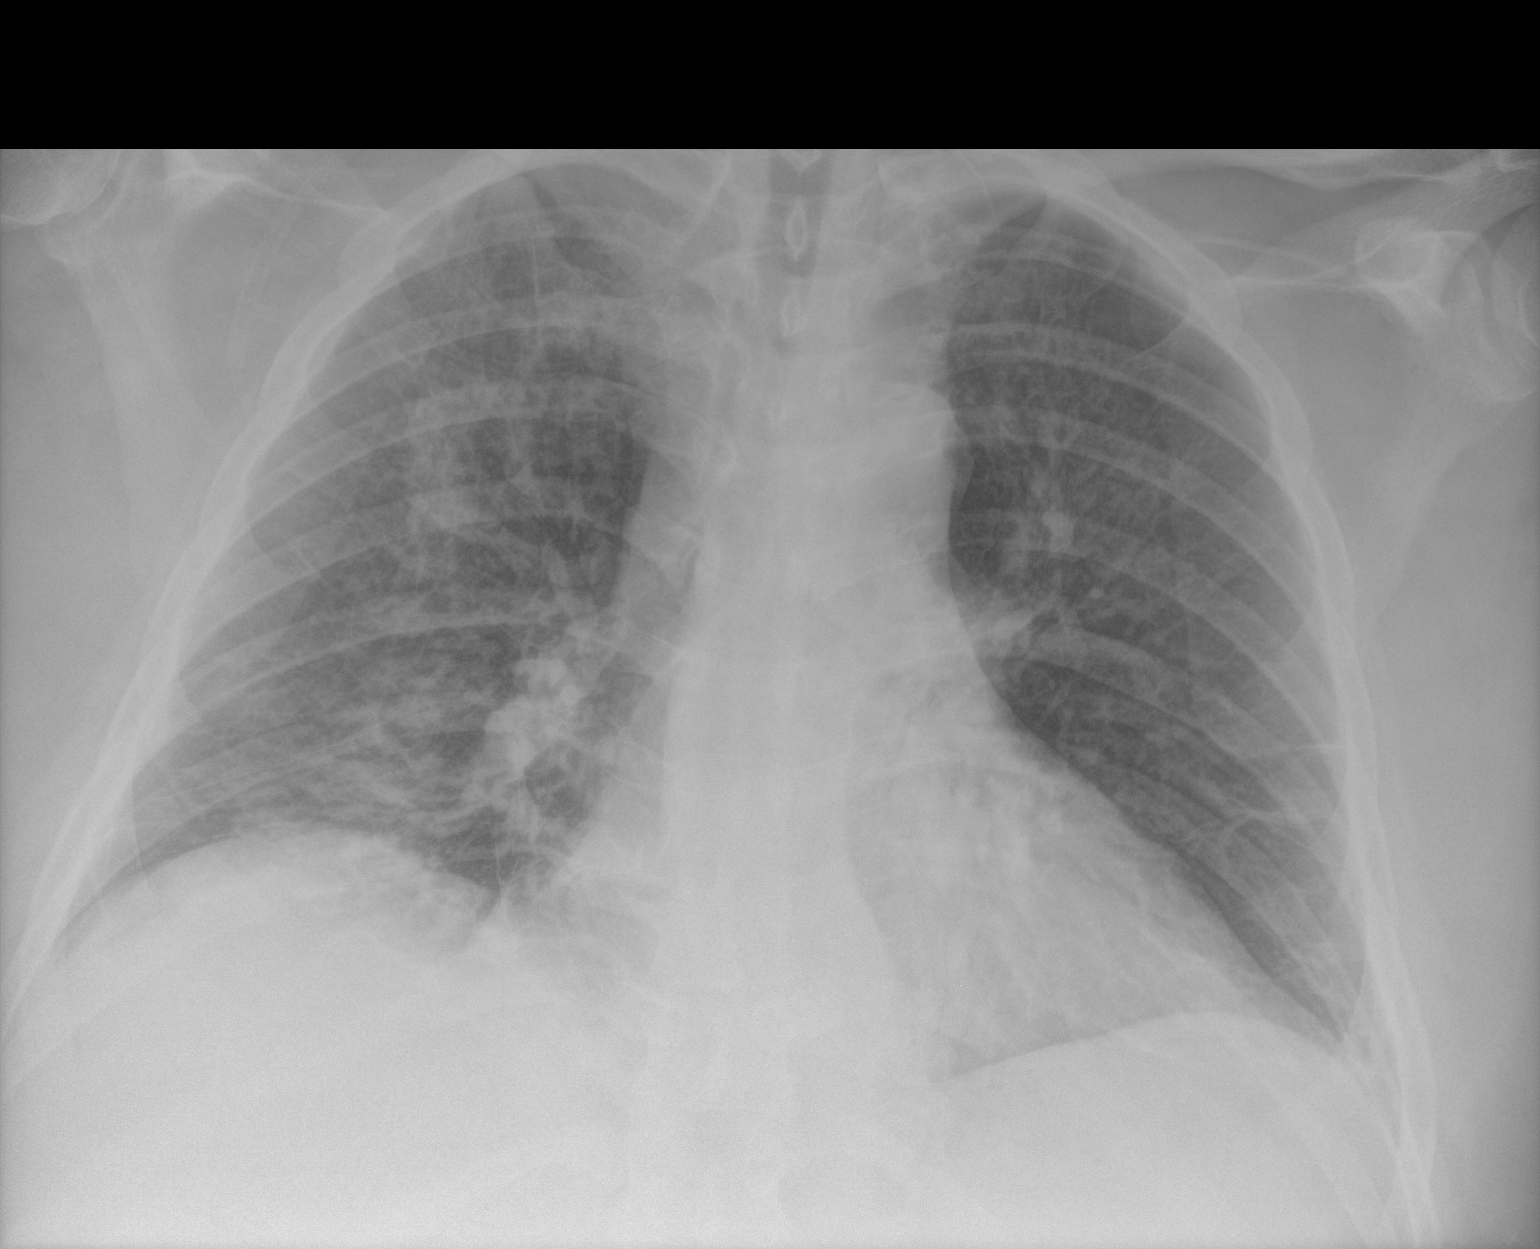

[1 of 1 positions shown; findings below may reference images not displayed]

FINDINGS: The heart size and mediastinal contours are within normal limits.
Lungs are hypo aerated with crowding of the bronchovascular
markings. Superimposed ill-defined right basilar airspace opacity is
identified. The visualized skeletal structures are unremarkable.
IMPRESSION: Low volume exam with diffuse prominence of the interstitial markings
and more focal ill-defined right lower lobe airspace opacity which
could indicate atelectasis although early pneumonia could appear
similar. If symptoms persist, consider PA and lateral chest
radiographs obtained at full inspiration when the patient is
clinically able.

## 2017-02-15 IMAGING — CR DG CHEST 1V PORT
1 series · 1 of 1 positions shown · non-contrast
Comparison: Chest x-ray and chest CT scan February 09, 2015.

CLINICAL DATA: Acute respiratory failure with hypoxia, shortness of
breath, CHF.

EXAM:
PORTABLE CHEST - 1 VIEW

[AP]
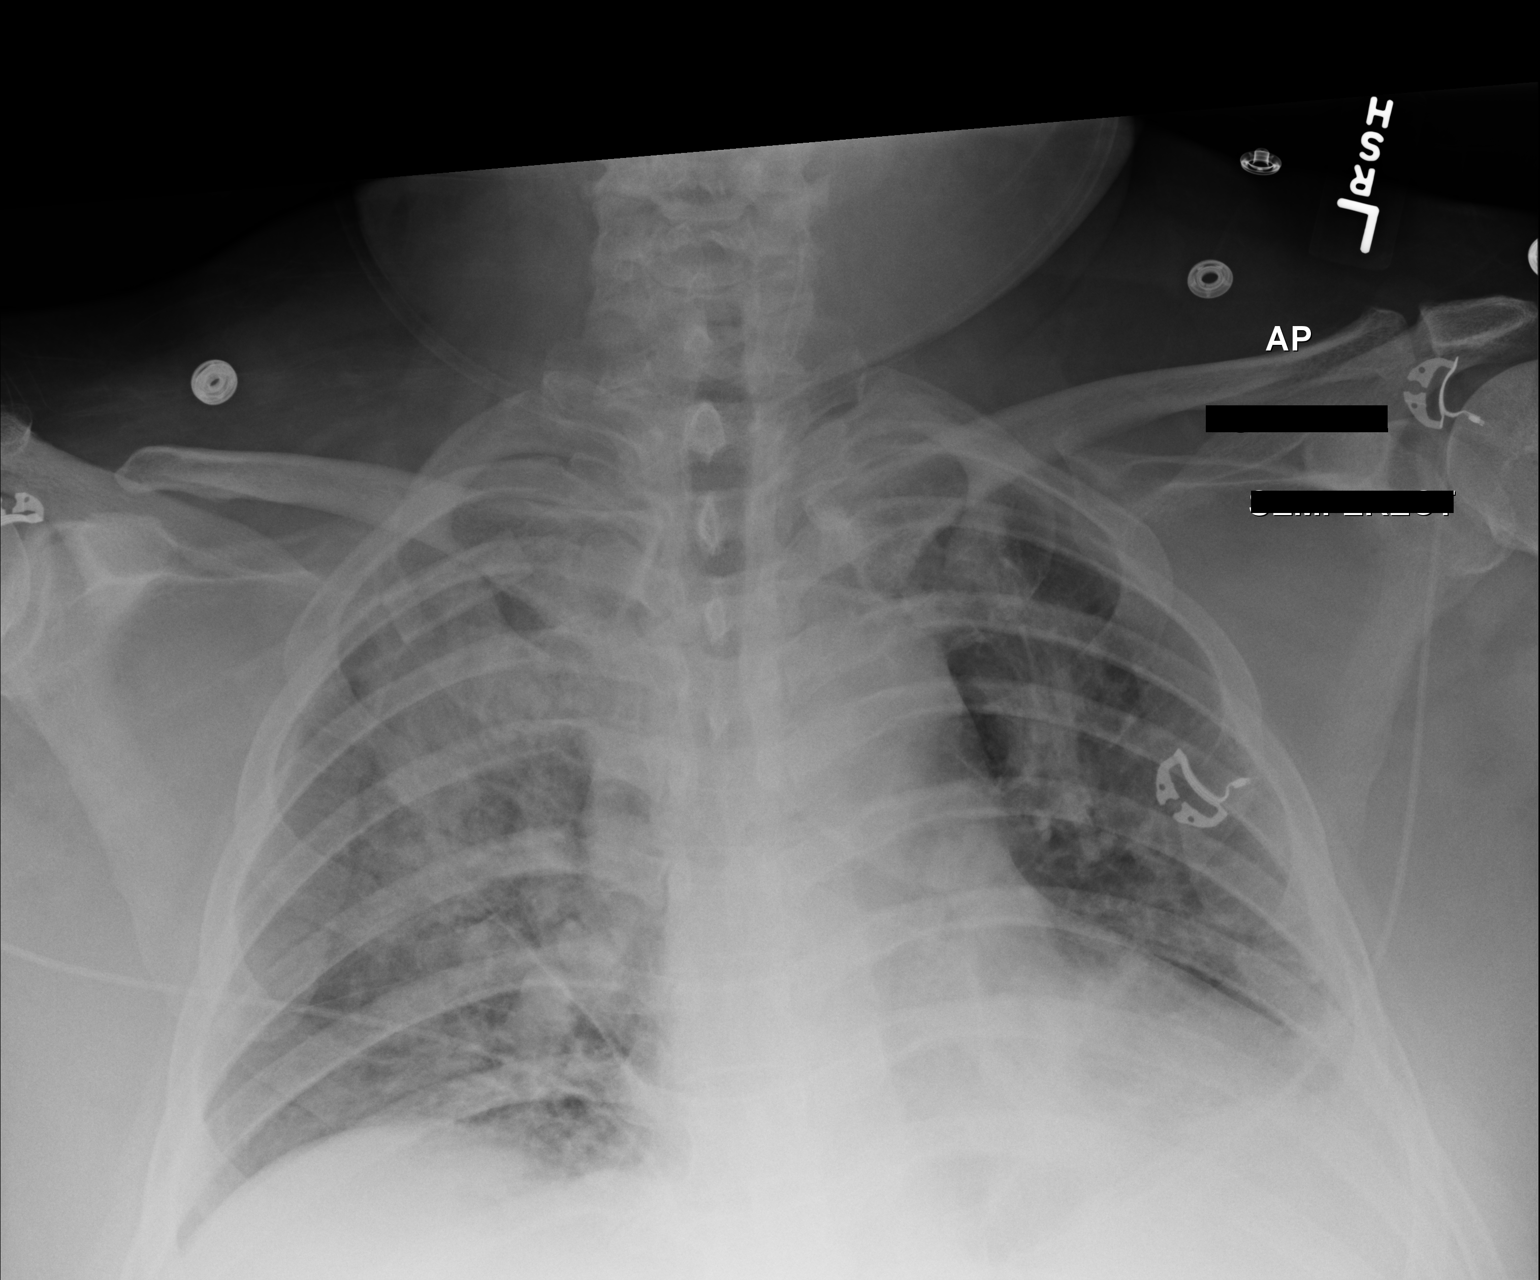

[1 of 1 positions shown; findings below may reference images not displayed]

FINDINGS: The lungs are mildly hypoinflated. Persistent airspace opacities are
present bilaterally greatest on the right in the upper lobe. The
left hemidiaphragm is less well demonstrated today. The cardiac
silhouette is enlarged. The pulmonary vascularity is indistinct.
There is small left pleural effusion. There is chronic widening of
the right AC joint.
IMPRESSION: Bilateral pneumonia which has increased in conspicuity since
yesterday's study. There is a small left pleural effusion.

## 2017-02-16 IMAGING — CR DG CHEST 1V PORT
1 series · 1 of 1 positions shown · non-contrast
Comparison: Portable chest x-ray February 11, 2015

CLINICAL DATA: Health care associated pneumonia, CHF, acute
respiratory failure with hypoxia.

EXAM:
PORTABLE CHEST - 1 VIEW

[AP]
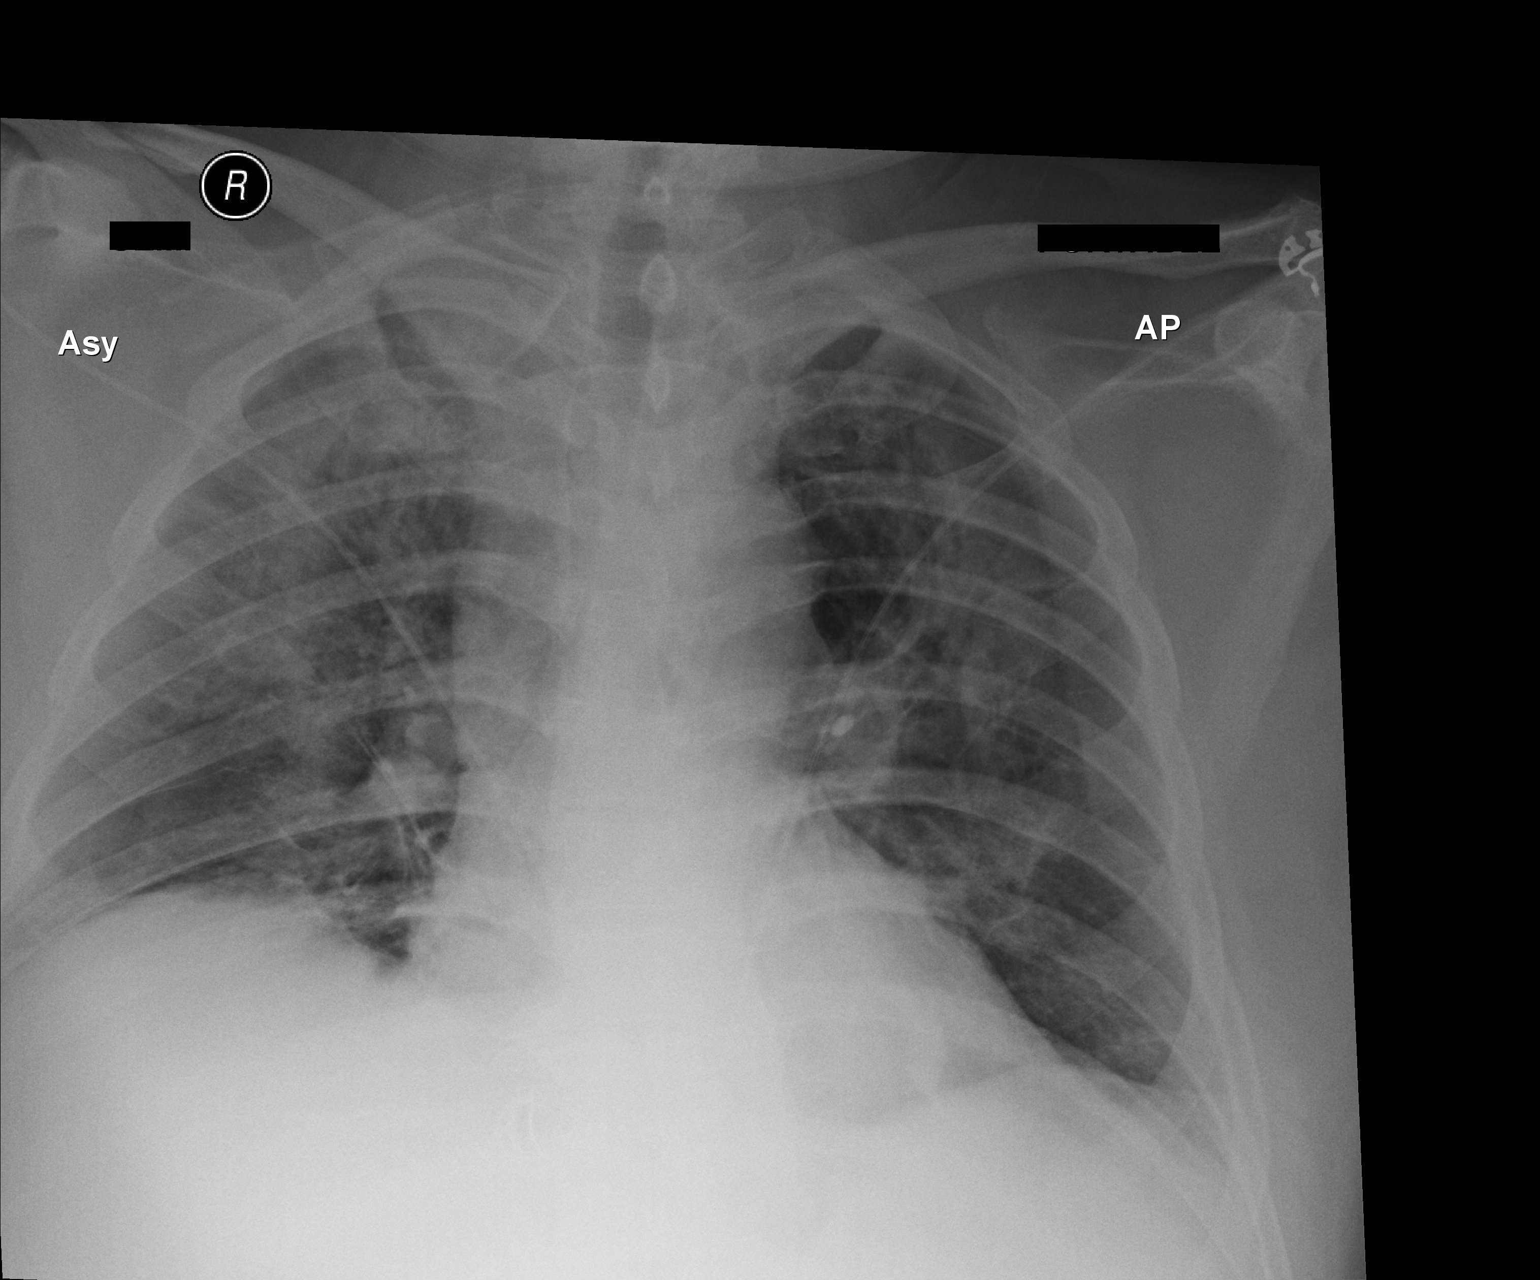

[1 of 1 positions shown; findings below may reference images not displayed]

FINDINGS: The lungs are mildly hypoinflated. The interstitial markings have
improved and the confluent alveolar opacities bilaterally are less
conspicuous. The left hemidiaphragm is better demonstrated. There is
no significant pleural effusion. The cardiac silhouette is normal in
size. The bony thorax exhibits no acute abnormality.
IMPRESSION: Further interval improvement in bilateral pneumonia. The small left
pleural effusion is less conspicuous today. When the patient can
tolerate the procedure, a PA and lateral chest x-ray would be
useful.

## 2017-04-19 ENCOUNTER — Ambulatory Visit (HOSPITAL_BASED_OUTPATIENT_CLINIC_OR_DEPARTMENT_OTHER)
Admission: RE | Admit: 2017-04-19 | Discharge: 2017-04-19 | Disposition: A | Discharge status: Home / Self Care | Payer: PPO | Source: Ambulatory Visit | Attending: Family | Admitting: Family

## 2017-04-19 ENCOUNTER — Encounter (HOSPITAL_BASED_OUTPATIENT_CLINIC_OR_DEPARTMENT_OTHER): Payer: Self-pay

## 2017-04-19 DIAGNOSIS — C4371 Malignant melanoma of right lower limb, including hip: Secondary | ICD-10-CM

## 2017-04-19 DIAGNOSIS — R918 Other nonspecific abnormal finding of lung field: Secondary | ICD-10-CM | POA: Diagnosis not present

## 2017-04-19 MED ORDER — IOPAMIDOL (ISOVUE-300) INJECTION 61%
100.0000 mL | Freq: Once | INTRAVENOUS | Status: AC | PRN
Start: 2017-04-19 — End: 2017-04-19
  Administered 2017-04-19: 100 mL via INTRAVENOUS

## 2017-04-21 ENCOUNTER — Telehealth: Payer: Self-pay | Admitting: *Deleted

## 2017-04-21 NOTE — Telephone Encounter (Addendum)
Patient is aware of results  ----- Message from Eliezer Bottom, NP sent at 04/21/2017  9:14 AM EDT ----- Regarding: Scans  Stable scans! No evidence of recurrent or metastatic disease!  Sarah  ----- Message ----- From: Buel Ream, Rad Results In Sent: 04/19/2017  11:29 AM To: Eliezer Bottom, NP

## 2017-05-17 ENCOUNTER — Other Ambulatory Visit: Payer: Self-pay

## 2017-05-17 NOTE — Patient Outreach (Signed)
[facility]) Care Management  05/17/2017  James Byrd 04-01-1966 [id number]   [employer] High risk screen completed. No Care management needs identified.  Thea Silversmith, RN, MSN, [place] Coordinator Cell: [phone]

## 2017-06-06 ENCOUNTER — Other Ambulatory Visit (HOSPITAL_BASED_OUTPATIENT_CLINIC_OR_DEPARTMENT_OTHER): Payer: PPO

## 2017-06-06 ENCOUNTER — Ambulatory Visit (HOSPITAL_BASED_OUTPATIENT_CLINIC_OR_DEPARTMENT_OTHER): Payer: PPO | Admitting: Family

## 2017-06-06 ENCOUNTER — Encounter: Payer: Self-pay | Admitting: Family

## 2017-06-06 ENCOUNTER — Other Ambulatory Visit: Payer: Self-pay

## 2017-06-06 VITALS — BP 102/59 | HR 52 | Temp 97.7°F | Resp 16 | Wt 225.0 lb

## 2017-06-06 DIAGNOSIS — Z85820 Personal history of malignant melanoma of skin: Secondary | ICD-10-CM

## 2017-06-06 DIAGNOSIS — C4371 Malignant melanoma of right lower limb, including hip: Secondary | ICD-10-CM

## 2017-06-06 DIAGNOSIS — J189 Pneumonia, unspecified organism: Secondary | ICD-10-CM

## 2017-06-06 DIAGNOSIS — Z9221 Personal history of antineoplastic chemotherapy: Secondary | ICD-10-CM

## 2017-06-06 DIAGNOSIS — G629 Polyneuropathy, unspecified: Secondary | ICD-10-CM | POA: Diagnosis not present

## 2017-06-06 DIAGNOSIS — T50905A Adverse effect of unspecified drugs, medicaments and biological substances, initial encounter: Secondary | ICD-10-CM

## 2017-06-06 DIAGNOSIS — D696 Thrombocytopenia, unspecified: Secondary | ICD-10-CM

## 2017-06-06 LAB — CBC WITH DIFFERENTIAL (CANCER CENTER ONLY)
BASO#: 0 10*3/uL (ref 0.0–0.2)
BASO%: 0.6 % (ref 0.0–2.0)
EOS%: 3.8 % (ref 0.0–7.0)
Eosinophils Absolute: 0.2 10*3/uL (ref 0.0–0.5)
HCT: 43.2 % (ref 38.7–49.9)
HGB: 15.1 g/dL (ref 13.0–17.1)
LYMPH#: 2.2 10*3/uL (ref 0.9–3.3)
LYMPH%: 44.3 % (ref 14.0–48.0)
MCH: 30.5 pg (ref 28.0–33.4)
MCHC: 35 g/dL (ref 32.0–35.9)
MCV: 87 fL (ref 82–98)
MONO#: 0.4 10*3/uL (ref 0.1–0.9)
MONO%: 7.5 % (ref 0.0–13.0)
NEUT#: 2.2 10*3/uL (ref 1.5–6.5)
NEUT%: 43.8 % (ref 40.0–80.0)
Platelets: 181 10*3/uL (ref 145–400)
RBC: 4.95 10*6/uL (ref 4.20–5.70)
RDW: 12.6 % (ref 11.1–15.7)
WBC: 5.1 10*3/uL (ref 4.0–10.0)

## 2017-06-06 LAB — CMP (CANCER CENTER ONLY)
ALT(SGPT): 27 U/L (ref 10–47)
AST: 23 U/L (ref 11–38)
Albumin: 3.9 g/dL (ref 3.3–5.5)
Alkaline Phosphatase: 78 U/L (ref 26–84)
BILIRUBIN TOTAL: 0.8 mg/dL (ref 0.20–1.60)
BUN, Bld: 19 mg/dL (ref 7–22)
CALCIUM: 9.4 mg/dL (ref 8.0–10.3)
CO2: 26 meq/L (ref 18–33)
CREATININE: 1 mg/dL (ref 0.6–1.2)
Chloride: 108 mEq/L (ref 98–108)
GLUCOSE: 77 mg/dL (ref 73–118)
Potassium: 4.1 mEq/L (ref 3.3–4.7)
SODIUM: 145 meq/L (ref 128–145)
Total Protein: 7 g/dL (ref 6.4–8.1)

## 2017-06-06 NOTE — Progress Notes (Signed)
Hematology and Oncology Follow Up Visit  James Byrd [id number] 19-Feb-1966 51 y.o. 06/06/2017   Principle Diagnosis:  Stage IIIB ([id number]) subungual melanoma of the right hallux  Current Therapy:   Yervoy-status post 3 cycles - discontinued in September 2016 secondary to toxicity   Interim History:  James Byrd is here today with his friend for follow-up. He is doing fairly well but continues to have issues with severe neuropathy in his lower extremities. He worries about how his medications will effect his liver and has been taking less of the Neurontin than prescribed (600 mg PO BID). As a result, his pain has seemed worse. His liver enzymes are stable. He will try taking his medication as prescribed and see if this helps. We can certainly check his LFT's in between visits if needed.  His balance is effected and he does stumble easily he denies having any falls and no syncopal episodes.  Despite this he is still trying to go to the gym and golfs now and then. He fatigues easily and will take breaks to rest as needed.  No swelling in his extremities. No lymphadenopathy found on exam.  No bleeding, bruising or petechiae.  He has had no issue with infections. No fever, chills, n/v, cough, rash, dizziness, SOB, chest pain, palpitations, abdominal pain or changes in bowel or bladder habits.   He is overdue for his colonoscopy and has scheduled this in January 2019.  His appetite comes and goes. He is hydrating well. His weight is stable.   ECOG Performance Status: 1 - Symptomatic but completely ambulatory  Medications:  Allergies as of 06/06/2017   No Known Allergies     Medication List        Accurate as of 06/06/17 10:13 PM. Always use your most recent med list.          aspirin 81 MG tablet Take 81 mg by mouth daily.   augmented betamethasone dipropionate 0.05 % cream Commonly known as:  DIPROLENE-AF Apply 1 application topically 2 (two) times daily as needed.     gabapentin 300 MG capsule Commonly known as:  NEURONTIN Take 2 capsules (600 mg total) by mouth 3 (three) times daily.   hydrocortisone 5 MG tablet Commonly known as:  CORTEF Take 5 mg by mouth 2 (two) times daily.   metoprolol succinate 50 MG 24 hr tablet Commonly known as:  TOPROL-XL Take 50 mg by mouth at bedtime. Take with or immediately following a meal.   testosterone 50 MG/5GM (1%) Gel Commonly known as:  ANDROGEL Place 5 g onto the skin daily.   vitamin C 1000 MG tablet Take 1,000 mg by mouth daily.   Vitamin D 2000 units tablet Take 2,000 Units by mouth daily.       Allergies: No Known Allergies  Past Medical History, Surgical history, Social history, and Family History were reviewed and updated.  Review of Systems: All other 10 point review of systems is negative.   Physical Exam:  weight is 225 lb (102.1 kg). His oral temperature is 97.7 F (36.5 C). His blood pressure is 102/59 (abnormal) and his pulse is 52 (abnormal). His respiration is 16 and oxygen saturation is 97%.   Wt Readings from Last 3 Encounters:  06/06/17 225 lb (102.1 kg)  02/08/17 224 lb 6 oz (101.8 kg)  12/09/16 222 lb (100.7 kg)    Ocular: Sclerae unicteric, pupils equal, round and reactive to light Ear-nose-throat: Oropharynx clear, dentition fair Lymphatic: No cervical, supraclavicular or axillary adenopathy Lungs  no rales or rhonchi, good excursion bilaterally Heart regular rate and rhythm, no murmur appreciated Abd soft, nontender, positive bowel sounds, no liver or spleen tip palpated on exam, no fluid wave  MSK no focal spinal tenderness, no joint edema Neuro: non-focal, well-oriented, appropriate affect Breasts: Deferred   Lab Results  Component Value Date   WBC 5.1 06/06/2017   HGB 15.1 06/06/2017   HCT 43.2 06/06/2017   MCV 87 06/06/2017   PLT 181 06/06/2017   Lab Results  Component Value Date   FERRITIN 363 (H) 09/04/2015   IRON 55 09/04/2015   TIBC 200 (L)  09/04/2015   UIBC 145 09/04/2015   IRONPCTSAT 28 09/04/2015   Lab Results  Component Value Date   RBC 4.95 06/06/2017   No results found for: KPAFRELGTCHN, LAMBDASER, KAPLAMBRATIO No results found for: IGGSERUM, IGA, IGMSERUM No results found for: Odetta Pink, SPEI   Chemistry      Component Value Date/Time   NA 145 06/06/2017 1316   NA 141 10/16/2015 1334   K 4.1 06/06/2017 1316   K 3.9 10/16/2015 1334   CL 108 06/06/2017 1316   CO2 26 06/06/2017 1316   CO2 21 (L) 10/16/2015 1334   BUN 19 06/06/2017 1316   BUN 20.0 10/16/2015 1334   CREATININE 1.0 06/06/2017 1316   CREATININE 1.0 10/16/2015 1334      Component Value Date/Time   CALCIUM 9.4 06/06/2017 1316   CALCIUM 9.7 10/16/2015 1334   ALKPHOS 78 06/06/2017 1316   ALKPHOS 62 10/16/2015 1334   AST 23 06/06/2017 1316   AST 16 10/16/2015 1334   ALT 27 06/06/2017 1316   ALT 11 10/16/2015 1334   BILITOT 0.80 06/06/2017 1316   BILITOT 0.49 10/16/2015 1334      Impression and Plan: James Byrd is a very pleasant 51 yo caucasian gentleman with history of stage IIIB melanoma of the right hallux that was subungual with one microscopic positive inguinal lymph node. He completed 3 cycles of Yervoy but stopped due to side effects.  He seems to be doing well but still has quite a bit of neuropathy in the lower extremities. He will try taking his Neurontin as prescribed and see if this helps with the pain.  He will let us know if he wants LFT's checked in between visits.  His CT scans in October showed no evidence of metastatic disease.  We will plan to see him back again in another 6 months with scans, follow-up and lab.  Both he and his wife are good to contact our office with any questions or concerns. We can certainly see him sooner if need be.   Laverna Peace, NP 12/18/201810:13 PM

## 2017-06-07 LAB — LACTATE DEHYDROGENASE: LDH: 154 U/L (ref 125–245)

## 2017-06-12 DIAGNOSIS — R112 Nausea with vomiting, unspecified: Secondary | ICD-10-CM | POA: Diagnosis not present

## 2017-06-29 DIAGNOSIS — K921 Melena: Secondary | ICD-10-CM | POA: Diagnosis not present

## 2017-07-03 DIAGNOSIS — Z23 Encounter for immunization: Secondary | ICD-10-CM | POA: Diagnosis not present

## 2017-07-05 DIAGNOSIS — M9905 Segmental and somatic dysfunction of pelvic region: Secondary | ICD-10-CM | POA: Diagnosis not present

## 2017-07-05 DIAGNOSIS — M542 Cervicalgia: Secondary | ICD-10-CM | POA: Diagnosis not present

## 2017-07-05 DIAGNOSIS — M545 Low back pain: Secondary | ICD-10-CM | POA: Diagnosis not present

## 2017-07-05 DIAGNOSIS — M9903 Segmental and somatic dysfunction of lumbar region: Secondary | ICD-10-CM | POA: Diagnosis not present

## 2017-07-05 DIAGNOSIS — M9902 Segmental and somatic dysfunction of thoracic region: Secondary | ICD-10-CM | POA: Diagnosis not present

## 2017-07-05 DIAGNOSIS — M9901 Segmental and somatic dysfunction of cervical region: Secondary | ICD-10-CM | POA: Diagnosis not present

## 2017-07-21 DIAGNOSIS — Z7982 Long term (current) use of aspirin: Secondary | ICD-10-CM | POA: Diagnosis not present

## 2017-07-21 DIAGNOSIS — Z85820 Personal history of malignant melanoma of skin: Secondary | ICD-10-CM | POA: Diagnosis not present

## 2017-07-21 DIAGNOSIS — I10 Essential (primary) hypertension: Secondary | ICD-10-CM | POA: Diagnosis not present

## 2017-07-21 DIAGNOSIS — Z1211 Encounter for screening for malignant neoplasm of colon: Secondary | ICD-10-CM | POA: Diagnosis not present

## 2017-07-21 DIAGNOSIS — Z86010 Personal history of colonic polyps: Secondary | ICD-10-CM | POA: Diagnosis not present

## 2017-07-21 DIAGNOSIS — Z79899 Other long term (current) drug therapy: Secondary | ICD-10-CM | POA: Diagnosis not present

## 2017-07-21 DIAGNOSIS — I471 Supraventricular tachycardia: Secondary | ICD-10-CM | POA: Diagnosis not present

## 2017-08-10 ENCOUNTER — Other Ambulatory Visit: Payer: Self-pay | Admitting: Neurology

## 2017-08-24 DIAGNOSIS — Z85820 Personal history of malignant melanoma of skin: Secondary | ICD-10-CM | POA: Diagnosis not present

## 2017-08-24 DIAGNOSIS — Z1331 Encounter for screening for depression: Secondary | ICD-10-CM | POA: Diagnosis not present

## 2017-08-24 DIAGNOSIS — Z6829 Body mass index (BMI) 29.0-29.9, adult: Secondary | ICD-10-CM | POA: Diagnosis not present

## 2017-08-24 DIAGNOSIS — Z125 Encounter for screening for malignant neoplasm of prostate: Secondary | ICD-10-CM | POA: Diagnosis not present

## 2017-08-24 DIAGNOSIS — I471 Supraventricular tachycardia: Secondary | ICD-10-CM | POA: Diagnosis not present

## 2017-08-24 DIAGNOSIS — Z1339 Encounter for screening examination for other mental health and behavioral disorders: Secondary | ICD-10-CM | POA: Diagnosis not present

## 2017-08-24 DIAGNOSIS — E271 Primary adrenocortical insufficiency: Secondary | ICD-10-CM | POA: Diagnosis not present

## 2017-08-24 DIAGNOSIS — E291 Testicular hypofunction: Secondary | ICD-10-CM | POA: Diagnosis not present

## 2017-08-24 DIAGNOSIS — G629 Polyneuropathy, unspecified: Secondary | ICD-10-CM | POA: Diagnosis not present

## 2017-08-24 DIAGNOSIS — R1084 Generalized abdominal pain: Secondary | ICD-10-CM | POA: Diagnosis not present

## 2017-08-27 DIAGNOSIS — E876 Hypokalemia: Secondary | ICD-10-CM | POA: Diagnosis not present

## 2017-08-27 DIAGNOSIS — K5900 Constipation, unspecified: Secondary | ICD-10-CM | POA: Diagnosis not present

## 2017-08-27 DIAGNOSIS — Z85820 Personal history of malignant melanoma of skin: Secondary | ICD-10-CM | POA: Diagnosis not present

## 2017-08-27 DIAGNOSIS — R6521 Severe sepsis with septic shock: Secondary | ICD-10-CM | POA: Diagnosis not present

## 2017-08-27 DIAGNOSIS — I5032 Chronic diastolic (congestive) heart failure: Secondary | ICD-10-CM | POA: Diagnosis not present

## 2017-08-27 DIAGNOSIS — G620 Drug-induced polyneuropathy: Secondary | ICD-10-CM | POA: Diagnosis not present

## 2017-08-27 DIAGNOSIS — J9601 Acute respiratory failure with hypoxia: Secondary | ICD-10-CM | POA: Diagnosis not present

## 2017-08-27 DIAGNOSIS — N2889 Other specified disorders of kidney and ureter: Secondary | ICD-10-CM | POA: Diagnosis not present

## 2017-08-27 DIAGNOSIS — R918 Other nonspecific abnormal finding of lung field: Secondary | ICD-10-CM | POA: Diagnosis not present

## 2017-08-27 DIAGNOSIS — R6520 Severe sepsis without septic shock: Secondary | ICD-10-CM | POA: Diagnosis not present

## 2017-08-27 DIAGNOSIS — K8020 Calculus of gallbladder without cholecystitis without obstruction: Secondary | ICD-10-CM | POA: Diagnosis not present

## 2017-08-27 DIAGNOSIS — E785 Hyperlipidemia, unspecified: Secondary | ICD-10-CM | POA: Diagnosis not present

## 2017-08-27 DIAGNOSIS — Z452 Encounter for adjustment and management of vascular access device: Secondary | ICD-10-CM | POA: Diagnosis not present

## 2017-08-27 DIAGNOSIS — J101 Influenza due to other identified influenza virus with other respiratory manifestations: Secondary | ICD-10-CM | POA: Diagnosis not present

## 2017-08-27 DIAGNOSIS — I517 Cardiomegaly: Secondary | ICD-10-CM | POA: Diagnosis not present

## 2017-08-27 DIAGNOSIS — T451X5S Adverse effect of antineoplastic and immunosuppressive drugs, sequela: Secondary | ICD-10-CM | POA: Diagnosis not present

## 2017-08-27 DIAGNOSIS — R112 Nausea with vomiting, unspecified: Secondary | ICD-10-CM | POA: Diagnosis not present

## 2017-08-27 DIAGNOSIS — Z89411 Acquired absence of right great toe: Secondary | ICD-10-CM | POA: Diagnosis not present

## 2017-08-27 DIAGNOSIS — A419 Sepsis, unspecified organism: Secondary | ICD-10-CM | POA: Diagnosis not present

## 2017-08-27 DIAGNOSIS — J09X2 Influenza due to identified novel influenza A virus with other respiratory manifestations: Secondary | ICD-10-CM | POA: Diagnosis not present

## 2017-08-27 DIAGNOSIS — T380X5A Adverse effect of glucocorticoids and synthetic analogues, initial encounter: Secondary | ICD-10-CM | POA: Diagnosis not present

## 2017-08-27 DIAGNOSIS — Z9911 Dependence on respirator [ventilator] status: Secondary | ICD-10-CM | POA: Diagnosis not present

## 2017-08-27 DIAGNOSIS — R748 Abnormal levels of other serum enzymes: Secondary | ICD-10-CM | POA: Diagnosis not present

## 2017-08-27 DIAGNOSIS — J1000 Influenza due to other identified influenza virus with unspecified type of pneumonia: Secondary | ICD-10-CM | POA: Diagnosis not present

## 2017-08-27 DIAGNOSIS — R402441 Other coma, without documented Glasgow coma scale score, or with partial score reported, in the field [EMT or ambulance]: Secondary | ICD-10-CM | POA: Diagnosis not present

## 2017-08-27 DIAGNOSIS — R0602 Shortness of breath: Secondary | ICD-10-CM | POA: Diagnosis not present

## 2017-08-27 DIAGNOSIS — J189 Pneumonia, unspecified organism: Secondary | ICD-10-CM | POA: Diagnosis not present

## 2017-08-27 DIAGNOSIS — R579 Shock, unspecified: Secondary | ICD-10-CM | POA: Diagnosis not present

## 2017-08-27 DIAGNOSIS — J680 Bronchitis and pneumonitis due to chemicals, gases, fumes and vapors: Secondary | ICD-10-CM | POA: Diagnosis not present

## 2017-08-27 DIAGNOSIS — I471 Supraventricular tachycardia: Secondary | ICD-10-CM | POA: Diagnosis not present

## 2017-08-27 DIAGNOSIS — E272 Addisonian crisis: Secondary | ICD-10-CM | POA: Diagnosis not present

## 2017-08-27 DIAGNOSIS — E2740 Unspecified adrenocortical insufficiency: Secondary | ICD-10-CM | POA: Diagnosis not present

## 2017-08-27 DIAGNOSIS — Z9221 Personal history of antineoplastic chemotherapy: Secondary | ICD-10-CM | POA: Diagnosis not present

## 2017-08-27 DIAGNOSIS — R000 Tachycardia, unspecified: Secondary | ICD-10-CM | POA: Diagnosis not present

## 2017-08-27 DIAGNOSIS — I440 Atrioventricular block, first degree: Secondary | ICD-10-CM | POA: Diagnosis not present

## 2017-08-27 DIAGNOSIS — R4182 Altered mental status, unspecified: Secondary | ICD-10-CM | POA: Diagnosis not present

## 2017-09-01 ENCOUNTER — Other Ambulatory Visit: Payer: Self-pay

## 2017-09-01 NOTE — Patient Outreach (Signed)
[facility] [facility]) Care Management  09/01/2017  James Byrd [birth year] [id number]   Referral received. No outreach warranted at this time. Transition of Care  will be completed by primary care provider office who will refer to [facility] care management if needed.  Plan: RN CM will close case and notify care management assistant of case status.  Jone Baseman, RN, MSN [facility] Care Management Care Management Coordinator Direct Line [phone] Toll Free: [phone]  Fax: [phone]

## 2017-09-02 DIAGNOSIS — K8020 Calculus of gallbladder without cholecystitis without obstruction: Secondary | ICD-10-CM | POA: Diagnosis not present

## 2017-09-05 DIAGNOSIS — E271 Primary adrenocortical insufficiency: Secondary | ICD-10-CM | POA: Diagnosis not present

## 2017-09-05 DIAGNOSIS — Z6831 Body mass index (BMI) 31.0-31.9, adult: Secondary | ICD-10-CM | POA: Diagnosis not present

## 2017-09-05 DIAGNOSIS — K8020 Calculus of gallbladder without cholecystitis without obstruction: Secondary | ICD-10-CM | POA: Diagnosis not present

## 2017-09-06 DIAGNOSIS — G620 Drug-induced polyneuropathy: Secondary | ICD-10-CM | POA: Diagnosis not present

## 2017-09-06 DIAGNOSIS — I471 Supraventricular tachycardia: Secondary | ICD-10-CM | POA: Diagnosis not present

## 2017-09-06 DIAGNOSIS — J101 Influenza due to other identified influenza virus with other respiratory manifestations: Secondary | ICD-10-CM | POA: Diagnosis not present

## 2017-09-06 DIAGNOSIS — C4371 Malignant melanoma of right lower limb, including hip: Secondary | ICD-10-CM | POA: Diagnosis not present

## 2017-09-07 DIAGNOSIS — K8010 Calculus of gallbladder with chronic cholecystitis without obstruction: Secondary | ICD-10-CM | POA: Diagnosis not present

## 2017-09-18 DIAGNOSIS — Z79899 Other long term (current) drug therapy: Secondary | ICD-10-CM | POA: Diagnosis not present

## 2017-09-18 DIAGNOSIS — K824 Cholesterolosis of gallbladder: Secondary | ICD-10-CM | POA: Diagnosis not present

## 2017-09-18 DIAGNOSIS — I471 Supraventricular tachycardia: Secondary | ICD-10-CM | POA: Diagnosis not present

## 2017-09-18 DIAGNOSIS — E271 Primary adrenocortical insufficiency: Secondary | ICD-10-CM | POA: Diagnosis not present

## 2017-09-18 DIAGNOSIS — K8010 Calculus of gallbladder with chronic cholecystitis without obstruction: Secondary | ICD-10-CM | POA: Diagnosis not present

## 2017-09-18 DIAGNOSIS — Z7982 Long term (current) use of aspirin: Secondary | ICD-10-CM | POA: Diagnosis not present

## 2017-09-18 DIAGNOSIS — K660 Peritoneal adhesions (postprocedural) (postinfection): Secondary | ICD-10-CM | POA: Diagnosis not present

## 2017-09-18 DIAGNOSIS — E785 Hyperlipidemia, unspecified: Secondary | ICD-10-CM | POA: Diagnosis not present

## 2017-09-18 DIAGNOSIS — K8020 Calculus of gallbladder without cholecystitis without obstruction: Secondary | ICD-10-CM | POA: Diagnosis not present

## 2017-09-26 DIAGNOSIS — E291 Testicular hypofunction: Secondary | ICD-10-CM | POA: Diagnosis not present

## 2017-09-26 DIAGNOSIS — E2740 Unspecified adrenocortical insufficiency: Secondary | ICD-10-CM | POA: Diagnosis not present

## 2017-10-03 ENCOUNTER — Telehealth: Payer: Self-pay | Admitting: Hematology & Oncology

## 2017-10-03 NOTE — Telephone Encounter (Signed)
Pt in ofc to sign a PATIENT REQUEST for ACCESS form.    Picked up medical records.      COPY SCANNED

## 2017-10-12 DIAGNOSIS — Z6830 Body mass index (BMI) 30.0-30.9, adult: Secondary | ICD-10-CM | POA: Diagnosis not present

## 2017-10-12 DIAGNOSIS — R05 Cough: Secondary | ICD-10-CM | POA: Diagnosis not present

## 2017-10-13 ENCOUNTER — Ambulatory Visit (INDEPENDENT_AMBULATORY_CARE_PROVIDER_SITE_OTHER): Payer: PPO | Admitting: Vascular Surgery

## 2017-10-13 ENCOUNTER — Other Ambulatory Visit: Payer: Self-pay

## 2017-10-13 ENCOUNTER — Encounter: Payer: Self-pay | Admitting: Vascular Surgery

## 2017-10-13 VITALS — BP 128/86 | HR 90 | Temp 97.2°F | Resp 16 | Ht 72.0 in | Wt 227.0 lb

## 2017-10-13 DIAGNOSIS — I770 Arteriovenous fistula, acquired: Secondary | ICD-10-CM

## 2017-10-13 NOTE — Progress Notes (Signed)
Patient ID: James Byrd, male   DOB: 04/30/1966, 52 y.o.   MRN: [id number]  Reason for Consult: New Patient (Initial Visit) (AV fistula on CT scan.  Dr. Ann Held [phone].)   Referred by Myer Peer, MD  Subjective:     HPI:  James Byrd is a 52 y.o. male with a history of melanoma of his right great toe status post partial toe amputation.  From chemotherapy he has had bilateral lower extremity weakness and pain that is thought to be neurogenic in nature.  He was recently septic with concern for flu underwent CAT scan and ultimately had his gallbladder removed.  On CAT scan he had an incidental finding of a right-sided arteriovenous fistula.  He does not have any history of trauma but does have a previous cardiac ablation for paroxysmal supraventricular tachycardia.  He does not have any swelling of his right lower extremity does not have any groin pain in the area.  Past Medical History:  Diagnosis Date  . Anxiety 05/24/2012  . Chronic diastolic congestive heart failure ([place])   . Gout   . HLD (hyperlipidemia)   . Malignant melanoma of right great toe ([place]) 11/14/2014  . Paroxysmal supraventricular tachycardia ([place]) 05/24/2012   Described in the treadmill report; details are pending    Family History  Adopted: Yes  Problem Relation Age of Onset  . Stroke Mother   . COPD Father   . Diabetes Father   . Diabetes Sister   . Diabetes Brother    Past Surgical History:  Procedure Laterality Date  . ABLATION    . r toe partial ambutation    . ROTATOR CUFF REPAIR  2010   3 yrs ago  . ROTATOR CUFF REPAIR      Short Social History:  Social History   Tobacco Use  . Smoking status: Never Smoker  . Smokeless tobacco: Never Used  Substance Use Topics  . Alcohol use: No    Alcohol/week: 0.0 oz    No Known Allergies  Current Outpatient Medications  Medication Sig Dispense Refill  . Ascorbic Acid (VITAMIN C) 1000 MG tablet Take 1,000 mg by mouth daily.    Marland Kitchen aspirin  81 MG tablet Take 81 mg by mouth daily.    Marland Kitchen augmented betamethasone dipropionate (DIPROLENE-AF) 0.05 % cream Apply 1 application topically 2 (two) times daily as needed.     . Cholecalciferol (VITAMIN D) 2000 units tablet Take 2,000 Units by mouth daily.    Marland Kitchen gabapentin (NEURONTIN) 300 MG capsule TAKE 2 CAPSULES 3 TIMES DAILY 540 capsule 1  . hydrocortisone (CORTEF) 5 MG tablet Take 5 mg by mouth 2 (two) times daily.    Marland Kitchen testosterone (ANDROGEL) 50 MG/5GM (1%) GEL Place 5 g onto the skin daily.     No current facility-administered medications for this visit.    Facility-Administered Medications Ordered in Other Visits  Medication Dose Route Frequency Provider Last Rate Last Dose  . 0.9 %  sodium chloride infusion   Intravenous Continuous [name], MD 500 mL/hr at 02/05/15 1510      Review of Systems  Constitutional:  Constitutional negative. Eyes: Eyes negative.  Respiratory: Respiratory negative.  Cardiovascular: Cardiovascular negative.  GI: Gastrointestinal negative.  Musculoskeletal: Positive for gait problem and joint pain.  Skin: Skin negative.  Neurological: Positive for dizziness and focal weakness.  Hematologic: Hematologic/lymphatic negative.  Psychiatric: Psychiatric negative.        Objective:  Objective   Vitals:   10/13/17 1100  BP: 128/86  Pulse: 90  Resp: 16  Temp: (!) 97.2 F (36.2 C)  TempSrc: Oral  SpO2: 96%  Weight: 227 lb (103 kg)  Height: 6' (1.829 m)   Body mass index is 30.79 kg/m.  Physical Exam  Constitutional: He appears well-developed.  HENT:  Head: Normocephalic.  Eyes: Pupils are equal, round, and reactive to light.  Neck: Normal range of motion.  Cardiovascular: Normal rate.  Pulses:      Carotid pulses are 2+ on the right side, and 2+ on the left side.      Radial pulses are 2+ on the right side, and 2+ on the left side.       Femoral pulses are 2+ on the right side, and 2+ on the left side.      Dorsalis pedis pulses  are 2+ on the right side, and 2+ on the left side.  Pulmonary/Chest: Effort normal.  Abdominal: Soft. He exhibits no mass.  Neurological: He is alert.  Skin: Skin is warm and dry.  Psychiatric: He has a normal mood and affect. His behavior is normal. Judgment and thought content normal.    Data: I only have the read from the CT scan which is read as early filling of the right iliac and proximal lower leg veins suggestive of a small arteriovenous fistula     Assessment/Plan:     52 year old male presents with right sided arteriovenous fistula that is likely secondary to interventional procedure from his right femoral vein for a nodal ablation several years ago.  He does have significant bilateral lower extremity pain but has no evidence of arterial insufficiency or swelling with concern for venous overflow.  With this I would consider his arteriovenous fistula incidental finding only with his recent CAT scan and I do not think he merits any further intervention.  If he has significant swelling or worsening symptoms of his right lower extremity specifically we could consider angiogram with possible coiling of the fistula.  He can otherwise follow-up on a as needed basis.     Waynetta Sandy MD [facility]

## 2017-10-24 ENCOUNTER — Encounter: Payer: PPO | Admitting: Vascular Surgery

## 2017-10-24 ENCOUNTER — Encounter

## 2017-11-03 ENCOUNTER — Telehealth: Payer: Self-pay | Admitting: Hematology & Oncology

## 2017-11-03 NOTE — Telephone Encounter (Signed)
Faxed medical records to: [facility] Kyung Rudd: [phone]   CASE:  [id number]       Toast SCANNED

## 2017-11-07 NOTE — Progress Notes (Signed)
Follow-up Visit   Date: 02/08/17    James Byrd MRN: [id number] DOB: [birth year]   Interim History: James Byrd is a 52 y.o. right-handed Caucasian male with stage IIIB right great toe melanoma s/p amputation and adjuvant chemotheray, SVT, hyperlipidemia, depression, and GERD returning to the clinic for follow-up of peripheral neuropathy.  The patient was accompanied to the clinic by friend who also provides collateral information.    History of present illness: In 2015, he noticed discoloration of this right great toe nailbed which found to be malignant melanoma. There was no evidence of metastatic disease on CT. He started adjuvant chemotherapy with Curt Bears in June 2016. Following his first cycle, he developed polyarthralgia with swelling which was managed with decadron. Following his second cycle in July, he developed depressive symptoms and put on Lexapro. After the third dose, he was hospitalized for acute respiratory failure due to drug-induced pneurmonia and again for sepsis in August - September 2016. Since this time, he has developed generalized weakness of the arms and legs.He also endorses numbness of the hands which started late fall 2016. He is most bothered by leg stiffness. He had great difficulty with standing up, especially after sitting for prolonged period of time. His legs feel stiff, weak, and tired. Since early 2017, he feels that his symptoms have plateaued.  He has associated 70lb weight loss and endorses lack of appetite. He spends all day sitting on the coach. He does not eat meals unless his wife actively puts the food in front of him and only eats one meal per day. They have cans of ensure at home, but he does not drink it. He endorses very low mood and lack of motivation to do anything. He used to enjoy gardening, but with his leg stiffness and weakness, he does not have energy to do anything.  At his initial visit, he was noted to have  hyperreflexia and prominent spasticity of the legs and started on muscle relaxants.  MRI cervical, thoracic, and lumbar spine does not show any evidence of metastatic disease.  He has mild canal stenosis at C5-6, but nothing to explain his myelopathic findings. NCS/EMG showed sensorimotor polyneuropathy affecting the feet, axonal and demyelinating.  He was started on gabapentin for pain control.   By late 2017, he started getting more active and was walking 4 miles daily, which made his legs feel better, too.   He was also able to reduce his gabapentin to 900mg  twice daily, previously taking it TID.  Mood is good.   UPDATE 02/08/2017:  Over the past year, he has gained weight and is starting to be more active.  He is mowing his lawn, played golf this summer, and tries to do more around the home. He is still not able to function at his normal level and his friend states that he has to be motivated a lot to perform tasks/activities. His mood is better, but he can have fluctuating good days and bad days.  He continues to have burning pain in the feet which responds to gabapentin, but because of morning sedation, reduced the nighttime dose to 600mg .  No new complaints today.   UPDATE 11/08/2017:  He is here for 9 month follow-up visit.  Overall, he feels that his neuropathy may be getting worse because he has constant pressure-like throbbing pain the lower lower legs. He self tapered gabapentin down to 600mg  twice daily due to side effects.   Occasionally, he has tingling in the left arm, which  is very sporadic.  He is more active and enjoys playing golf with his friend. He had one hospitalization in March 2019 with pneumonia and sepsis requiring ICU stay.  He has recovered very well from this.   Medications:  Current Outpatient Medications on File Prior to Visit  Medication Sig Dispense Refill  . Ascorbic Acid (VITAMIN C) 1000 MG tablet Take 1,000 mg by mouth daily.    Marland Kitchen augmented betamethasone dipropionate  (DIPROLENE-AF) 0.05 % cream Apply 1 application topically 2 (two) times daily as needed.     . Cholecalciferol (VITAMIN D) 2000 units tablet Take 2,000 Units by mouth daily.    Marland Kitchen gabapentin (NEURONTIN) 300 MG capsule TAKE 2 CAPSULES 3 TIMES DAILY (Patient taking differently: 1200 mg bid) 540 capsule 1  . hydrocortisone (CORTEF) 5 MG tablet Take 5 mg by mouth 2 (two) times daily.    . Multiple Vitamin (MULTIVITAMIN) tablet Take 1 tablet by mouth daily.    Marland Kitchen testosterone (ANDROGEL) 50 MG/5GM (1%) GEL Place 5 g onto the skin daily.     Current Facility-Administered Medications on File Prior to Visit  Medication Dose Route Frequency Provider Last Rate Last Dose  . 0.9 %  sodium chloride infusion   Intravenous Continuous [name], MD 500 mL/hr at 02/05/15 1510      Allergies: No Known Allergies  Review of Systems:  CONSTITUTIONAL: No fevers, chills, night sweats, or weight loss.  EYES: No visual changes or eye pain ENT: No hearing changes.  No history of nose bleeds.   RESPIRATORY: No cough, wheezing and shortness of breath.   CARDIOVASCULAR: Negative for chest pain, and palpitations.   GI: Negative for abdominal discomfort, blood in stools or black stools.  No recent change in bowel habits.   GU:  No history of incontinence.   MUSCLOSKELETAL: No history of joint pain or swelling.  No myalgias.   SKIN: Negative for lesions, rash, and itching.   ENDOCRINE: Negative for cold or heat intolerance, polydipsia or goiter.   PSYCH:  No depression or anxiety symptoms.   NEURO: As Above.   Vital Signs:  BP 100/70   Pulse 72   Ht 5' 11.5" (1.816 m)   Wt 228 lb 6 oz (103.6 kg)   SpO2 92%   BMI 31.41 kg/m   General Medical Exam:   General:  Well appearing, comfortable  Eyes/ENT: see cranial nerve examination.   Neck: No masses appreciated.  Full range of motion without tenderness.  No carotid bruits. Respiratory:  Clear to auscultation, good air entry bilaterally.   Cardiac:  Regular  rate and rhythm, no murmur.   Ext;  No edema  Neurological Exam: MENTAL STATUS including orientation to time, place, person, recent and remote memory, attention span and concentration, language, and fund of knowledge is normal.  Speech is not dysarthric. Engages in conversation and smiling   CRANIAL NERVES:  Pupils round and reactive.  Extraocular muscles intact.  Face is symmetric.   MOTOR:  Motor strength is 5/5 in all extremities, except bilateral dorsiflexion and toe extension/flexion is 4/5.   Tone is only mildly increased in the legs  MSRs:  Reflexes are 2+/4 throughout, except 3+ bilateral patella with crossed adductors.   SENSORY:  Intact to vibration throughout  COORDINATION/GAIT:  Gait appears normal, except slightly stooped posture and poor arm swing on the right.     Data: MRI brain wwo contrast 06/16/2015: Negative  MRI brain wwo contrast 03/10/2015:  1. No acute intracranial abnormality. 2.  Extensive paranasal sinus mucosal disease, correlate clinically for acute sinusitis. 3. Left larger than right mastoid effusions.  MRI thoracic spine wwo contrast 10/29/2015: Normal.  MRI cercical spine wwo contrast 11/11/2015: 1.  No acute findings to explain patient's symptoms.  No evidence of myelopathy or cord compression. 2.  Spondylosis at C5-6 contributes to borderline spinal stenosis and moderate foraminal stenosis bilaterally.  There is potential encroachment of the C6 nerve root. 3.  Mild left foraminal narrowing at C6-7. 4.  Sinusitis  MRI lumbar spine wwo contrast 10/16/2015:   1.  No areas of metastatic disease of the lumbar spine. 2.  At L4-5 there is a mild broad based disc bulge with a right lateral disc protrusion abutting the right extra foraminal L4 nerve root.  Bilateral lateral recess stenosis.  Mild bilateral facet arthropathy.  Labs 10/09/2015:  Vitamin B12 601, vitamin B1 11, copper 98, zinc 89, ceruloplasmin 25, AChR antibody neg Labs 01/18/2016:  GAD  neg  NCS/EMG of the legs 12/08/2015:  The electrophysiologic findings are most consistent with a chronic and symmetric sensorimotor polyneuropathy, axonal loss and demyelinating in type, affecting the lower extremities.  IMPRESSION/PLAN: 1.  Axonal and demyelinating peripheral neuropathy affecting the legs following chemotherapy with Yervoy for melanoma of the right hallux s/p amputation. Clinically, unchanged and still with painful paresthesias in the legs and sporadic left arm paresthesia.  I offered to reassess symptoms with NCS/EMG of the left side, which he will think about. For pain, start nortriptyline 10mg  at bedtime for 2 week, then increase to 2 tablet at bedtime Try to slowly wean gabapentin as he has sedation with this.  After 1 month of being on nortriptyline, reduce gabapentin by 300mg  every 2 weeks.   Encouraged to stay active and start stretching/exercise regimen  2. Low vitamin B12.  Check vitamin B12 level to be sure he is absorbing PO supplementation  3. History of medication-induced myelopathy, improved.  MRI neuroaxis imaging did not show any structural changes.    Call with update in 2 months to determine medication adjustment  Return to clinic in 6 months  Greater than 50% of this 30 minute visit was spent in counseling, explanation of diagnosis, planning of further management, and coordination of care.   Thank you for allowing me to participate in patient's care.  If I can answer any additional questions, I would be pleased to do so.    Sincerely,    Kawthar Ennen K. Posey Pronto, DO

## 2017-11-08 ENCOUNTER — Other Ambulatory Visit: Payer: PPO

## 2017-11-08 ENCOUNTER — Encounter: Payer: Self-pay | Admitting: Neurology

## 2017-11-08 ENCOUNTER — Ambulatory Visit: Payer: PPO | Admitting: Neurology

## 2017-11-08 VITALS — BP 100/70 | HR 72 | Ht 71.5 in | Wt 228.4 lb

## 2017-11-08 DIAGNOSIS — M62838 Other muscle spasm: Secondary | ICD-10-CM

## 2017-11-08 DIAGNOSIS — G620 Drug-induced polyneuropathy: Secondary | ICD-10-CM | POA: Diagnosis not present

## 2017-11-08 DIAGNOSIS — T451X5A Adverse effect of antineoplastic and immunosuppressive drugs, initial encounter: Secondary | ICD-10-CM | POA: Diagnosis not present

## 2017-11-08 MED ORDER — NORTRIPTYLINE HCL 10 MG PO CAPS: ORAL_CAPSULE | ORAL | 5 refills | Status: DC

## 2017-11-08 NOTE — Patient Instructions (Addendum)
Check vitamin B12  Your provider requests that you have LABS drawn today.  We share a lab with [facility] - they are located in [address] (second floor) of this building.  Once you get there, please have a seat and the phlebotomist will call your name.  If you have waited more than 15 minutes, please advise the front desk   Start nortriptyline 10mg  at bedtime for 2 week, then increase to 2 tablet at bedtime  If you are doing well after one month, you can reduce gabapentin to 1 tablet in the morning and 2 at bedtime x 2 weeks, then reduce to 1 tablet twice daily.  If your pain gets worse, you can go back to taking the higher dose of gabapentin.  Call my office in 2 months with update on how you are doing and the dose of gabapentin and nortriptyline  Stay active, start stretching regimen  Return to clinic in 6 months

## 2017-11-09 ENCOUNTER — Telehealth: Payer: Self-pay | Admitting: *Deleted

## 2017-11-09 LAB — VITAMIN B12: VITAMIN B 12: 842 pg/mL (ref 200–1100)

## 2017-11-09 MED ORDER — NORTRIPTYLINE HCL 10 MG PO CAPS: ORAL_CAPSULE | ORAL | 3 refills | Status: DC

## 2017-11-09 NOTE — Telephone Encounter (Signed)
-----   Message from Alda Berthold, DO sent at 11/09/2017  9:27 AM EDT ----- Please notify patient vitam B12 looks great and is within normal limits. Continue OTC B12 1065mcg daily.  Thank you.

## 2017-11-09 NOTE — Telephone Encounter (Signed)
Patient and wife given results and instructions.  Sent in 90 day supply of nortriptyline per wife's request.

## 2017-12-05 ENCOUNTER — Inpatient Hospital Stay: Payer: PPO | Attending: Hematology & Oncology | Admitting: Hematology & Oncology

## 2017-12-05 ENCOUNTER — Ambulatory Visit (HOSPITAL_BASED_OUTPATIENT_CLINIC_OR_DEPARTMENT_OTHER)
Admission: RE | Admit: 2017-12-05 | Discharge: 2017-12-05 | Disposition: A | Discharge status: Home / Self Care | Payer: PPO | Source: Ambulatory Visit | Attending: Family | Admitting: Family

## 2017-12-05 ENCOUNTER — Other Ambulatory Visit: Payer: Self-pay

## 2017-12-05 ENCOUNTER — Inpatient Hospital Stay: Payer: PPO

## 2017-12-05 VITALS — BP 125/83 | HR 88 | Temp 98.2°F | Resp 20 | Wt 233.2 lb

## 2017-12-05 DIAGNOSIS — Z9222 Personal history of monoclonal drug therapy: Secondary | ICD-10-CM | POA: Insufficient documentation

## 2017-12-05 DIAGNOSIS — D696 Thrombocytopenia, unspecified: Secondary | ICD-10-CM

## 2017-12-05 DIAGNOSIS — C4371 Malignant melanoma of right lower limb, including hip: Secondary | ICD-10-CM

## 2017-12-05 DIAGNOSIS — C439 Malignant melanoma of skin, unspecified: Secondary | ICD-10-CM | POA: Diagnosis not present

## 2017-12-05 LAB — CMP (CANCER CENTER ONLY)
ALBUMIN: 3.8 g/dL (ref 3.5–5.0)
ALT: 35 U/L (ref 10–47)
ANION GAP: 11 (ref 5–15)
AST: 32 U/L (ref 11–38)
Alkaline Phosphatase: 77 U/L (ref 26–84)
BUN: 14 mg/dL (ref 7–22)
CALCIUM: 9.7 mg/dL (ref 8.0–10.3)
CO2: 29 mmol/L (ref 18–33)
Chloride: 105 mmol/L (ref 98–108)
Creatinine: 1.4 mg/dL — ABNORMAL HIGH (ref 0.60–1.20)
GLUCOSE: 91 mg/dL (ref 73–118)
Potassium: 4.5 mmol/L (ref 3.3–4.7)
SODIUM: 145 mmol/L (ref 128–145)
TOTAL PROTEIN: 7.6 g/dL (ref 6.4–8.1)
Total Bilirubin: 1 mg/dL (ref 0.2–1.6)

## 2017-12-05 LAB — CBC WITH DIFFERENTIAL (CANCER CENTER ONLY)
BASOS ABS: 0 10*3/uL (ref 0.0–0.1)
BASOS PCT: 0 %
EOS ABS: 0.2 10*3/uL (ref 0.0–0.5)
EOS PCT: 3 %
HCT: 45.1 % (ref 38.7–49.9)
Hemoglobin: 15.5 g/dL (ref 13.0–17.1)
LYMPHS PCT: 39 %
Lymphs Abs: 1.8 10*3/uL (ref 0.9–3.3)
MCH: 29.9 pg (ref 28.0–33.4)
MCHC: 34.4 g/dL (ref 32.0–35.9)
MCV: 87.1 fL (ref 82.0–98.0)
MONO ABS: 0.4 10*3/uL (ref 0.1–0.9)
Monocytes Relative: 8 %
Neutro Abs: 2.3 10*3/uL (ref 1.5–6.5)
Neutrophils Relative %: 50 %
PLATELETS: 193 10*3/uL (ref 145–400)
RBC: 5.18 MIL/uL (ref 4.20–5.70)
RDW: 13.1 % (ref 11.1–15.7)
WBC: 4.7 10*3/uL (ref 4.0–10.0)

## 2017-12-05 LAB — LACTATE DEHYDROGENASE: LDH: 188 U/L (ref 125–245)

## 2017-12-05 MED ORDER — IOPAMIDOL (ISOVUE-300) INJECTION 61%
100.0000 mL | Freq: Once | INTRAVENOUS | Status: AC | PRN
  Administered 2017-12-05: 100 mL via INTRAVENOUS

## 2017-12-05 NOTE — Progress Notes (Signed)
Hematology and Oncology Follow Up Visit  James Byrd [id number] Jun 19, 1966 52 y.o. 12/05/2017   Principle Diagnosis:  Stage IIIB (T3bN1aM0) subungual melanoma of the right hallux  Current Therapy:   Yervoy-status post 3 cycles - discontinued in September 2016 secondary to toxicity   Interim History:  James Byrd is here today with his wife for follow-up.  We saw him 6 months ago.  Since then, he really has had difficulties.  He was at [facility] and intubated because of influenza 3 months ago.  He then had his gallbladder taken out 2 months ago.  Thankfully, he got through both episodes.  He still has a neurological issues.  He does has very little stamina.  He is losing his short-term memory because of the immunotherapy that he took.  He also is losing his short-term memory because of medication to try to combat the neurologic toxicities.  He is just not able to work.  We did do a CT scan on him today.  Thankfully, the CT scan did not show any evidence of recurrent disease.  The good news is that they are expecting their first grandchild in January 2020.  His appetite is doing a little bit better.  He has a little bit better taste for food.  There is no problems with bowels or bladder.   Overall, his performance status is ECOG 2.    Medications:  Allergies as of 12/05/2017   No Known Allergies     Medication List        Accurate as of 12/05/17 11:56 AM. Always use your most recent med list.          aspirin EC 81 MG tablet Take 81 mg by mouth daily.   augmented betamethasone dipropionate 0.05 % cream Commonly known as:  DIPROLENE-AF Apply 1 application topically 2 (two) times daily as needed.   fludrocortisone 0.1 MG tablet Commonly known as:  FLORINEF Take 0.1 mg by mouth daily.   gabapentin 300 MG capsule Commonly known as:  NEURONTIN TAKE 2 CAPSULES 3 TIMES DAILY   hydrocortisone 5 MG tablet Commonly known as:  CORTEF Take 5 mg by mouth 2 (two) times  daily.   multivitamin tablet Take 1 tablet by mouth daily.   nortriptyline 10 MG capsule Commonly known as:  PAMELOR Start nortriptyline 10mg  at bedtime for 2 week, then increase to 2 tablet at bedtime   testosterone 50 MG/5GM (1%) Gel Commonly known as:  ANDROGEL Place 5 g onto the skin daily.   vitamin B-12 1000 MCG tablet Commonly known as:  CYANOCOBALAMIN Take 1,000 mcg by mouth daily.   vitamin C 1000 MG tablet Take 1,000 mg by mouth daily.   Vitamin D 2000 units tablet Take 2,000 Units by mouth daily.       Allergies: No Known Allergies  Past Medical History, Surgical history, Social history, and Family History were reviewed and updated.  Review of Systems: Review of Systems  Constitutional: Positive for malaise/fatigue.  HENT: Negative.   Eyes: Negative.   Respiratory: Positive for shortness of breath.   Cardiovascular: Negative.   Gastrointestinal: Positive for constipation and nausea.  Genitourinary: Negative.   Musculoskeletal: Positive for joint pain and myalgias.  Skin: Negative.   Neurological: Positive for tingling and focal weakness.  Endo/Heme/Allergies: Negative.   Psychiatric/Behavioral: Positive for memory loss.     Physical Exam:  weight is 233 lb 4 oz (105.8 kg). His oral temperature is 98.2 F (36.8 C). His blood pressure is 125/83 and his  pulse is 88. His respiration is 20 and oxygen saturation is 96%.   Wt Readings from Last 3 Encounters:  12/05/17 233 lb 4 oz (105.8 kg)  11/08/17 228 lb 6 oz (103.6 kg)  10/13/17 227 lb (103 kg)    Physical Exam  Constitutional: He is oriented to person, place, and time.  HENT:  Head: Normocephalic and atraumatic.  Mouth/Throat: Oropharynx is clear and moist.  Eyes: Pupils are equal, round, and reactive to light. EOM are normal.  Neck: Normal range of motion.  Cardiovascular: Normal rate, regular rhythm and normal heart sounds.  Pulmonary/Chest: Effort normal and breath sounds normal.    Abdominal: Soft. Bowel sounds are normal.  Musculoskeletal: Normal range of motion. He exhibits no edema, tenderness or deformity.  Lymphadenopathy:    He has no cervical adenopathy.  Neurological: He is alert and oriented to person, place, and time.  Skin: Skin is warm and dry. No rash noted. No erythema.  Psychiatric: He has a normal mood and affect. His behavior is normal. Judgment and thought content normal.  Vitals reviewed.    Lab Results  Component Value Date   WBC 4.7 12/05/2017   HGB 15.5 12/05/2017   HCT 45.1 12/05/2017   MCV 87.1 12/05/2017   PLT 193 12/05/2017   Lab Results  Component Value Date   FERRITIN 363 (H) 09/04/2015   IRON 55 09/04/2015   TIBC 200 (L) 09/04/2015   UIBC 145 09/04/2015   IRONPCTSAT 28 09/04/2015   Lab Results  Component Value Date   RBC 5.18 12/05/2017   No results found for: KPAFRELGTCHN, LAMBDASER, KAPLAMBRATIO No results found for: IGGSERUM, IGA, IGMSERUM No results found for: Odetta Pink, SPEI   Chemistry      Component Value Date/Time   NA 145 12/05/2017 1023   NA 145 06/06/2017 1316   NA 141 10/16/2015 1334   K 4.5 12/05/2017 1023   K 4.1 06/06/2017 1316   K 3.9 10/16/2015 1334   CL 105 12/05/2017 1023   CL 108 06/06/2017 1316   CO2 29 12/05/2017 1023   CO2 26 06/06/2017 1316   CO2 21 (L) 10/16/2015 1334   BUN 14 12/05/2017 1023   BUN 19 06/06/2017 1316   BUN 20.0 10/16/2015 1334   CREATININE 1.40 (H) 12/05/2017 1023   CREATININE 1.0 06/06/2017 1316   CREATININE 1.0 10/16/2015 1334      Component Value Date/Time   CALCIUM 9.7 12/05/2017 1023   CALCIUM 9.4 06/06/2017 1316   CALCIUM 9.7 10/16/2015 1334   ALKPHOS 77 12/05/2017 1023   ALKPHOS 78 06/06/2017 1316   ALKPHOS 62 10/16/2015 1334   AST 32 12/05/2017 1023   AST 16 10/16/2015 1334   ALT 35 12/05/2017 1023   ALT 27 06/06/2017 1316   ALT 11 10/16/2015 1334   BILITOT 1.0 12/05/2017 1023   BILITOT 0.49  10/16/2015 1334      Impression and Plan: James Byrd is a very pleasant 52 yo caucasian gentleman with history of stage IIIB melanoma of the right hallux that was subungual with one microscopic positive inguinal lymph node. He completed 3 cycles of Yervoy but stopped due to side effects.   Thankfully, everything is holding steady.  There is no evidence of recurrent melanoma.  He is still at risk for recurrent disease.  We still have to follow him.  I think another CT scan in 6 months would be reasonable.  I just feel bad that he has had  these neurologic issues and that they will just not improve.  We will plan to get him back in 6 months.  We will do a CT scan when we see him back.    [name], MD 6/18/201911:56 AM

## 2017-12-18 DIAGNOSIS — E785 Hyperlipidemia, unspecified: Secondary | ICD-10-CM | POA: Diagnosis not present

## 2017-12-18 DIAGNOSIS — E271 Primary adrenocortical insufficiency: Secondary | ICD-10-CM | POA: Diagnosis not present

## 2018-01-16 ENCOUNTER — Encounter: Payer: Self-pay | Admitting: *Deleted

## 2018-01-17 DIAGNOSIS — R05 Cough: Secondary | ICD-10-CM | POA: Diagnosis not present

## 2018-01-17 DIAGNOSIS — E271 Primary adrenocortical insufficiency: Secondary | ICD-10-CM | POA: Diagnosis not present

## 2018-04-04 DIAGNOSIS — Z6831 Body mass index (BMI) 31.0-31.9, adult: Secondary | ICD-10-CM | POA: Diagnosis not present

## 2018-04-04 DIAGNOSIS — M7521 Bicipital tendinitis, right shoulder: Secondary | ICD-10-CM | POA: Diagnosis not present

## 2018-04-12 DIAGNOSIS — Z23 Encounter for immunization: Secondary | ICD-10-CM | POA: Diagnosis not present

## 2018-04-12 DIAGNOSIS — E291 Testicular hypofunction: Secondary | ICD-10-CM | POA: Diagnosis not present

## 2018-04-12 DIAGNOSIS — Z0000 Encounter for general adult medical examination without abnormal findings: Secondary | ICD-10-CM | POA: Diagnosis not present

## 2018-04-12 DIAGNOSIS — Z760 Encounter for issue of repeat prescription: Secondary | ICD-10-CM | POA: Diagnosis not present

## 2018-04-12 DIAGNOSIS — I9589 Other hypotension: Secondary | ICD-10-CM | POA: Diagnosis not present

## 2018-04-12 DIAGNOSIS — E271 Primary adrenocortical insufficiency: Secondary | ICD-10-CM | POA: Diagnosis not present

## 2018-04-12 DIAGNOSIS — G629 Polyneuropathy, unspecified: Secondary | ICD-10-CM | POA: Diagnosis not present

## 2018-04-12 DIAGNOSIS — Z6831 Body mass index (BMI) 31.0-31.9, adult: Secondary | ICD-10-CM | POA: Diagnosis not present

## 2018-04-24 DIAGNOSIS — M7521 Bicipital tendinitis, right shoulder: Secondary | ICD-10-CM | POA: Diagnosis not present

## 2018-04-24 DIAGNOSIS — M62521 Muscle wasting and atrophy, not elsewhere classified, right upper arm: Secondary | ICD-10-CM | POA: Diagnosis not present

## 2018-04-24 DIAGNOSIS — M25511 Pain in right shoulder: Secondary | ICD-10-CM | POA: Diagnosis not present

## 2018-04-26 DIAGNOSIS — M62521 Muscle wasting and atrophy, not elsewhere classified, right upper arm: Secondary | ICD-10-CM | POA: Diagnosis not present

## 2018-04-26 DIAGNOSIS — M25511 Pain in right shoulder: Secondary | ICD-10-CM | POA: Diagnosis not present

## 2018-04-26 DIAGNOSIS — M7521 Bicipital tendinitis, right shoulder: Secondary | ICD-10-CM | POA: Diagnosis not present

## 2018-04-30 DIAGNOSIS — E271 Primary adrenocortical insufficiency: Secondary | ICD-10-CM | POA: Diagnosis not present

## 2018-04-30 DIAGNOSIS — Z6832 Body mass index (BMI) 32.0-32.9, adult: Secondary | ICD-10-CM | POA: Diagnosis not present

## 2018-04-30 DIAGNOSIS — J069 Acute upper respiratory infection, unspecified: Secondary | ICD-10-CM | POA: Diagnosis not present

## 2018-05-08 DIAGNOSIS — M7521 Bicipital tendinitis, right shoulder: Secondary | ICD-10-CM | POA: Diagnosis not present

## 2018-05-08 DIAGNOSIS — M25511 Pain in right shoulder: Secondary | ICD-10-CM | POA: Diagnosis not present

## 2018-05-08 DIAGNOSIS — M62521 Muscle wasting and atrophy, not elsewhere classified, right upper arm: Secondary | ICD-10-CM | POA: Diagnosis not present

## 2018-05-11 ENCOUNTER — Ambulatory Visit (INDEPENDENT_AMBULATORY_CARE_PROVIDER_SITE_OTHER): Payer: PPO | Admitting: Neurology

## 2018-05-11 ENCOUNTER — Encounter: Payer: Self-pay | Admitting: Neurology

## 2018-05-11 VITALS — BP 104/68 | HR 64 | Ht 71.5 in | Wt 238.5 lb

## 2018-05-11 DIAGNOSIS — T451X5A Adverse effect of antineoplastic and immunosuppressive drugs, initial encounter: Secondary | ICD-10-CM

## 2018-05-11 DIAGNOSIS — G620 Drug-induced polyneuropathy: Secondary | ICD-10-CM | POA: Diagnosis not present

## 2018-05-11 NOTE — Progress Notes (Signed)
Follow-up Visit   Date: 05/11/18    James Byrd MRN: [id number] DOB: March 30, 1966   Interim History: James Byrd is a 52 y.o. right-handed Caucasian male with stage IIIB right great toe melanoma s/p amputation and adjuvant chemotheray, SVT, hyperlipidemia, depression, and GERD returning to the clinic for follow-up of peripheral neuropathy.  The patient was accompanied to the clinic by friend who also provides collateral information.    History of present illness: In 2015, he noticed discoloration of this right great toe nailbed which found to be malignant melanoma, which was localized. He started adjuvant chemotherapy with Curt Bears in June 2016. He developed polyarthralgias, severe depression, and hospitaized with drug-induced pneumonia with respiratory failure August - Sept 2016. Since this time, he had generalized weakness of the arms and legs, numbness of the hands, and leg stiffness.  At his initial visit, he was noted to have hyperreflexia and prominent spasticity of the legs and started on muscle relaxants.  MRI cervical, thoracic, and lumbar spine does not show any evidence of metastatic disease.  He has mild canal stenosis at C5-6, but nothing to explain his myelopathic findings. NCS/EMG showed sensorimotor polyneuropathy affecting the feet, axonal and demyelinating.  He was started on gabapentin for pain control.  By late 2017, he started getting more active and was walking 4 miles daily, which made his legs feel better, too.   He was also able to reduce his gabapentin to 900mg  twice daily, previously taking it TID.    UPDATE 05/11/2018:  He is here for 53-month appointment.  Over the summer, he was able to continue with usual activities, such as mowing the yard and other household chores, but gets tired quickly and takes breaks often.  He self tapered gabapentin to 300mg  twice daily because of cognitive difficulty and has not noticed worsening pain.  His mind is much sharper.  He  suffered one fall at church and feels that his ankle instability contributed to that, moreso than neuropathy.    Medications:  Current Outpatient Medications on File Prior to Visit  Medication Sig Dispense Refill  . Ascorbic Acid (VITAMIN C) 1000 MG tablet Take 1,000 mg by mouth daily.    Marland Kitchen augmented betamethasone dipropionate (DIPROLENE-AF) 0.05 % cream Apply 1 application topically 2 (two) times daily as needed.     . Cholecalciferol (VITAMIN D) 2000 units tablet Take 2,000 Units by mouth daily.    . fludrocortisone (FLORINEF) 0.1 MG tablet Take 0.1 mg by mouth daily.    Marland Kitchen gabapentin (NEURONTIN) 300 MG capsule TAKE 2 CAPSULES 3 TIMES DAILY (Patient taking differently: 300 mg 2 (two) times daily. ) 540 capsule 1  . hydrocortisone (CORTEF) 5 MG tablet Take 5 mg by mouth 2 (two) times daily.    . Multiple Vitamin (MULTIVITAMIN) tablet Take 1 tablet by mouth daily.    Marland Kitchen testosterone (ANDROGEL) 50 MG/5GM (1%) GEL Place 5 g onto the skin daily.    . vitamin B-12 (CYANOCOBALAMIN) 1000 MCG tablet Take 1,000 mcg by mouth daily.    Marland Kitchen aspirin EC 81 MG tablet Take 81 mg by mouth daily.     Current Facility-Administered Medications on File Prior to Visit  Medication Dose Route Frequency Provider Last Rate Last Dose  . 0.9 %  sodium chloride infusion   Intravenous Continuous [name], MD 500 mL/hr at 02/05/15 1510      Allergies: No Known Allergies  Review of Systems:  CONSTITUTIONAL: No fevers, chills, night sweats, or weight loss.  EYES:  No visual changes or eye pain ENT: No hearing changes.  No history of nose bleeds.   RESPIRATORY: No cough, wheezing and shortness of breath.   CARDIOVASCULAR: Negative for chest pain, and palpitations.   GI: Negative for abdominal discomfort, blood in stools or black stools.  No recent change in bowel habits.   GU:  No history of incontinence.   MUSCLOSKELETAL: No history of joint pain or swelling.  No myalgias.   SKIN: Negative for lesions, rash, and  itching.   ENDOCRINE: Negative for cold or heat intolerance, polydipsia or goiter.   PSYCH:  No depression or anxiety symptoms.   NEURO: As Above.   Vital Signs:  BP 104/68   Pulse 64   Ht 5' 11.5" (1.816 m)   Wt 238 lb 8 oz (108.2 kg)   SpO2 95%   BMI 32.80 kg/m    Neurological Exam: MENTAL STATUS including orientation to time, place, person, recent and remote memory, attention span and concentration, language, and fund of knowledge is normal.  Speech is not dysarthric. Engages in conversation and smiling   CRANIAL NERVES:  Pupils round and reactive.  Extraocular muscles intact.  Face is symmetric.   MOTOR:  Motor strength is 5/5 in all extremities, except bilateral dorsiflexion and toe extension/flexion is 4+/5.  Tone is normal in the legs  MSRs:  Reflexes are 2+/4 throughout, except 3+ bilateral patella with crossed adductors.   SENSORY:  Intact to vibration throughout.  Mild sway with Rhomberg testing.  COORDINATION/GAIT:  Gait appears normal, except slightly stooped posture and poor arm swing on the right.  He is unsteady with stressed and tandem gait, but able to perform.    Data: MRI brain wwo contrast 06/16/2015: Negative  MRI brain wwo contrast 03/10/2015:  1. No acute intracranial abnormality. 2. Extensive paranasal sinus mucosal disease, correlate clinically for acute sinusitis. 3. Left larger than right mastoid effusions.  MRI thoracic spine wwo contrast 10/29/2015: Normal.  MRI cercical spine wwo contrast 11/11/2015: 1.  No acute findings to explain patient's symptoms.  No evidence of myelopathy or cord compression. 2.  Spondylosis at C5-6 contributes to borderline spinal stenosis and moderate foraminal stenosis bilaterally.  There is potential encroachment of the C6 nerve root. 3.  Mild left foraminal narrowing at C6-7. 4.  Sinusitis  MRI lumbar spine wwo contrast 10/16/2015:   1.  No areas of metastatic disease of the lumbar spine. 2.  At L4-5 there is a  mild broad based disc bulge with a right lateral disc protrusion abutting the right extra foraminal L4 nerve root.  Bilateral lateral recess stenosis.  Mild bilateral facet arthropathy.  Labs 10/09/2015:  Vitamin B12 601, vitamin B1 11, copper 98, zinc 89, ceruloplasmin 25, AChR antibody neg Labs 01/18/2016:  GAD neg  NCS/EMG of the legs 12/08/2015:  The electrophysiologic findings are most consistent with a chronic and symmetric sensorimotor polyneuropathy, axonal loss and demyelinating in type, affecting the lower extremities.  Lab Results  Component Value Date   [id number] 035 11/08/2017    IMPRESSION/PLAN: 1.  Axonal and demyelinating peripheral neuropathy affecting the legs following chemotherapy with Yervoy for melanoma of the right hallux s/p amputation. He has less painful paresthesias and was able to self taper gabapentin to 300mg  BID.  Overall, distal strength is also better.  He has mild sensory ataxia and I suggested home exercises that he can perform to work on balance.   2.  History of medication-induced myelopathy, improved.  MRI neuroaxis imaging did not  show any structural changes.    Return to clinic in 9 months  Greater than 50% of this 20 minute visit was spent in counseling, explanation of diagnosis, planning of further management, and coordination of care.    Thank you for allowing me to participate in patient's care.  If I can answer any additional questions, I would be pleased to do so.    Sincerely,    Brina Umeda K. Posey Pronto, DO

## 2018-05-11 NOTE — Patient Instructions (Signed)
Continue gabapentin 300mg  twice daily  Start balance training exercising  Return to clinic in 9 months

## 2018-05-19 DIAGNOSIS — E271 Primary adrenocortical insufficiency: Secondary | ICD-10-CM | POA: Diagnosis not present

## 2018-05-19 DIAGNOSIS — I9589 Other hypotension: Secondary | ICD-10-CM | POA: Diagnosis not present

## 2018-05-24 ENCOUNTER — Telehealth: Payer: Self-pay | Admitting: Neurology

## 2018-05-24 NOTE — Telephone Encounter (Signed)
Patient's wife is calling in with questions regarding Gabapentin medication and stating that his legs and feet are still hurting him really bad. Please call her back at  [phone]. Thanks!

## 2018-05-25 ENCOUNTER — Telehealth: Payer: Self-pay | Admitting: *Deleted

## 2018-05-25 MED ORDER — GABAPENTIN 300 MG PO CAPS: ORAL_CAPSULE | ORAL | 1 refills | Status: DC

## 2018-05-25 NOTE — Telephone Encounter (Signed)
Left message for James Byrd to call me back.

## 2018-05-25 NOTE — Telephone Encounter (Signed)
Opened in error

## 2018-05-25 NOTE — Telephone Encounter (Signed)
I spoke with patient's wife and she said that he would rather have the 300 mg tablets instead of the 600 mg tablets.  Rx sent in.

## 2018-05-25 NOTE — Telephone Encounter (Signed)
Dr. Posey Pronto patient. Caryl Pina please contact to discuss.

## 2018-06-04 ENCOUNTER — Ambulatory Visit (HOSPITAL_BASED_OUTPATIENT_CLINIC_OR_DEPARTMENT_OTHER)
Admission: RE | Admit: 2018-06-04 | Discharge: 2018-06-04 | Disposition: A | Discharge status: Home / Self Care | Payer: PPO | Source: Ambulatory Visit | Attending: Hematology & Oncology | Admitting: Hematology & Oncology

## 2018-06-04 ENCOUNTER — Other Ambulatory Visit: Payer: Self-pay

## 2018-06-04 ENCOUNTER — Inpatient Hospital Stay: Payer: PPO | Attending: Hematology & Oncology | Admitting: Hematology & Oncology

## 2018-06-04 ENCOUNTER — Encounter: Payer: Self-pay | Admitting: Hematology & Oncology

## 2018-06-04 ENCOUNTER — Inpatient Hospital Stay: Payer: PPO

## 2018-06-04 VITALS — BP 102/67 | HR 60 | Temp 97.8°F | Resp 18 | Wt 240.5 lb

## 2018-06-04 DIAGNOSIS — C4371 Malignant melanoma of right lower limb, including hip: Secondary | ICD-10-CM

## 2018-06-04 DIAGNOSIS — Z79899 Other long term (current) drug therapy: Secondary | ICD-10-CM | POA: Insufficient documentation

## 2018-06-04 DIAGNOSIS — Z7982 Long term (current) use of aspirin: Secondary | ICD-10-CM | POA: Diagnosis not present

## 2018-06-04 DIAGNOSIS — Z85820 Personal history of malignant melanoma of skin: Secondary | ICD-10-CM

## 2018-06-04 DIAGNOSIS — C439 Malignant melanoma of skin, unspecified: Secondary | ICD-10-CM | POA: Diagnosis not present

## 2018-06-04 LAB — CBC WITH DIFFERENTIAL (CANCER CENTER ONLY)
ABS IMMATURE GRANULOCYTES: 0.01 10*3/uL (ref 0.00–0.07)
Basophils Absolute: 0 10*3/uL (ref 0.0–0.1)
Basophils Relative: 0 %
Eosinophils Absolute: 0.2 10*3/uL (ref 0.0–0.5)
Eosinophils Relative: 4 %
HCT: 44.5 % (ref 39.0–52.0)
Hemoglobin: 15 g/dL (ref 13.0–17.0)
Immature Granulocytes: 0 %
Lymphocytes Relative: 40 %
Lymphs Abs: 1.9 10*3/uL (ref 0.7–4.0)
MCH: 29.8 pg (ref 26.0–34.0)
MCHC: 33.7 g/dL (ref 30.0–36.0)
MCV: 88.5 fL (ref 80.0–100.0)
Monocytes Absolute: 0.4 10*3/uL (ref 0.1–1.0)
Monocytes Relative: 9 %
NEUTROS ABS: 2.3 10*3/uL (ref 1.7–7.7)
Neutrophils Relative %: 47 %
PLATELETS: 186 10*3/uL (ref 150–400)
RBC: 5.03 MIL/uL (ref 4.22–5.81)
RDW: 12.5 % (ref 11.5–15.5)
WBC Count: 4.8 10*3/uL (ref 4.0–10.5)
nRBC: 0 % (ref 0.0–0.2)

## 2018-06-04 LAB — COMPREHENSIVE METABOLIC PANEL
ALT: 21 U/L (ref 0–44)
AST: 22 U/L (ref 15–41)
Albumin: 4.2 g/dL (ref 3.5–5.0)
Alkaline Phosphatase: 105 U/L (ref 38–126)
Anion gap: 7 (ref 5–15)
BUN: 17 mg/dL (ref 6–20)
CO2: 25 mmol/L (ref 22–32)
Calcium: 9.3 mg/dL (ref 8.9–10.3)
Chloride: 105 mmol/L (ref 98–111)
Creatinine, Ser: 1.12 mg/dL (ref 0.61–1.24)
GFR calc Af Amer: >60 mL/min (ref >60)
Glucose, Bld: 87 mg/dL (ref 70–99)
Potassium: 4.1 mmol/L (ref 3.5–5.1)
Sodium: 137 mmol/L (ref 135–145)
Total Bilirubin: 0.9 mg/dL (ref 0.3–1.2)
Total Protein: 7.3 g/dL (ref 6.5–8.1)

## 2018-06-04 LAB — LACTATE DEHYDROGENASE: LDH: 157 U/L (ref 98–192)

## 2018-06-04 MED ORDER — IOPAMIDOL (ISOVUE-300) INJECTION 61%
100.0000 mL | Freq: Once | INTRAVENOUS | Status: AC | PRN
  Administered 2018-06-04: 100 mL via INTRAVENOUS

## 2018-06-04 NOTE — Progress Notes (Signed)
Hematology and Oncology Follow Up Visit  James Byrd [id number] [birth year] 52 y.o. 06/04/2018   Principle Diagnosis:  Stage IIIB (T3bN1aM0) subungual melanoma of the right hallux  Current Therapy:   Yervoy-status post 3 cycles - discontinued in September 2016 secondary to toxicity   Interim History:  James Byrd is here today with his wife for follow-up.  We saw him 6 months ago.  He actually is doing fairly well.  He still is not able to work.  The neurological issues are permanent I feel.  He just has no stamina.  He has memory issues.  He really took a "hit" from the immunotherapy that we gave him.  He is expecting his first grandchild in 2022-08-07.  It will be a granddaughter.  He is excited about this.  He has had no cough.  He has had no bleeding.  There is been no nausea or vomiting.  We did do a CT scan on him today.  The CT scan thankfully that did not show any evidence of recurrent disease.-  Overall, his performance status is ECOG 2.   Medications:  Allergies as of 06/04/2018   No Known Allergies     Medication List       Accurate as of June 04, 2018 11:51 AM. Always use your most recent med list.        aspirin EC 81 MG tablet Take 81 mg by mouth daily.   augmented betamethasone dipropionate 0.05 % cream Commonly known as:  DIPROLENE-AF Apply 1 application topically 2 (two) times daily as needed.   fludrocortisone 0.1 MG tablet Commonly known as:  FLORINEF Take 0.1 mg by mouth daily.   gabapentin 300 MG capsule Commonly known as:  NEURONTIN TAKE 2 CAPSULES 3 TIMES DAILY   hydrocortisone 5 MG tablet Commonly known as:  CORTEF Take 5 mg by mouth 2 (two) times daily.   multivitamin tablet Take 1 tablet by mouth daily.   testosterone 50 MG/5GM (1%) Gel Commonly known as:  ANDROGEL Place 5 g onto the skin daily.   vitamin B-12 1000 MCG tablet Commonly known as:  CYANOCOBALAMIN Take 1,000 mcg by mouth daily.   vitamin C 1000 MG  tablet Take 1,000 mg by mouth daily.   Vitamin D 50 MCG (2000 UT) tablet Take 2,000 Units by mouth daily.       Allergies: No Known Allergies  Past Medical History, Surgical history, Social history, and Family History were reviewed and updated.  Review of Systems: Review of Systems  Constitutional: Positive for malaise/fatigue.  HENT: Negative.   Eyes: Negative.   Respiratory: Positive for shortness of breath.   Cardiovascular: Negative.   Gastrointestinal: Positive for constipation and nausea.  Genitourinary: Negative.   Musculoskeletal: Positive for joint pain and myalgias.  Skin: Negative.   Neurological: Positive for tingling and focal weakness.  Endo/Heme/Allergies: Negative.   Psychiatric/Behavioral: Positive for memory loss.     Physical Exam:  weight is 240 lb 8 oz (109.1 kg). His oral temperature is 97.8 F (36.6 C). His blood pressure is 102/67 and his pulse is 60. His respiration is 18 and oxygen saturation is 97%.   Wt Readings from Last 3 Encounters:  06/04/18 240 lb 8 oz (109.1 kg)  05/11/18 238 lb 8 oz (108.2 kg)  12/05/17 233 lb 4 oz (105.8 kg)    Physical Exam Vitals signs reviewed.  HENT:     Head: Normocephalic and atraumatic.  Eyes:     Pupils: Pupils are equal, round, and  reactive to light.  Neck:     Musculoskeletal: Normal range of motion.  Cardiovascular:     Rate and Rhythm: Normal rate and regular rhythm.     Heart sounds: Normal heart sounds.  Pulmonary:     Effort: Pulmonary effort is normal.     Breath sounds: Normal breath sounds.  Abdominal:     General: Bowel sounds are normal.     Palpations: Abdomen is soft.  Musculoskeletal: Normal range of motion.        General: No tenderness or deformity.  Lymphadenopathy:     Cervical: No cervical adenopathy.  Skin:    General: Skin is warm and dry.     Findings: No erythema or rash.  Neurological:     Mental Status: He is alert and oriented to person, place, and time.   Psychiatric:        Behavior: Behavior normal.        Thought Content: Thought content normal.        Judgment: Judgment normal.      Lab Results  Component Value Date   WBC 4.8 06/04/2018   HGB 15.0 06/04/2018   HCT 44.5 06/04/2018   MCV 88.5 06/04/2018   PLT 186 06/04/2018   Lab Results  Component Value Date   FERRITIN 363 (H) 09/04/2015   IRON 55 09/04/2015   TIBC 200 (L) 09/04/2015   UIBC 145 09/04/2015   IRONPCTSAT 28 09/04/2015   Lab Results  Component Value Date   RBC 5.03 06/04/2018   No results found for: KPAFRELGTCHN, LAMBDASER, KAPLAMBRATIO No results found for: IGGSERUM, IGA, IGMSERUM No results found for: Odetta Pink, SPEI   Chemistry      Component Value Date/Time   NA 137 06/04/2018 1036   NA 145 06/06/2017 1316   NA 141 10/16/2015 1334   K 4.1 06/04/2018 1036   K 4.1 06/06/2017 1316   K 3.9 10/16/2015 1334   CL 105 06/04/2018 1036   CL 108 06/06/2017 1316   CO2 25 06/04/2018 1036   CO2 26 06/06/2017 1316   CO2 21 (L) 10/16/2015 1334   BUN 17 06/04/2018 1036   BUN 19 06/06/2017 1316   BUN 20.0 10/16/2015 1334   CREATININE 1.12 06/04/2018 1036   CREATININE 1.40 (H) 12/05/2017 1023   CREATININE 1.0 06/06/2017 1316   CREATININE 1.0 10/16/2015 1334      Component Value Date/Time   CALCIUM 9.3 06/04/2018 1036   CALCIUM 9.4 06/06/2017 1316   CALCIUM 9.7 10/16/2015 1334   ALKPHOS 105 06/04/2018 1036   ALKPHOS 78 06/06/2017 1316   ALKPHOS 62 10/16/2015 1334   AST 22 06/04/2018 1036   AST 32 12/05/2017 1023   AST 16 10/16/2015 1334   ALT 21 06/04/2018 1036   ALT 35 12/05/2017 1023   ALT 27 06/06/2017 1316   ALT 11 10/16/2015 1334   BILITOT 0.9 06/04/2018 1036   BILITOT 1.0 12/05/2017 1023   BILITOT 0.49 10/16/2015 1334      Impression and Plan: James Byrd is a very pleasant 52 yo caucasian gentleman with history of stage IIIB melanoma of the right hallux that was subungual with one  microscopic positive inguinal lymph node. He completed 3 cycles of Yervoy but stopped due to side effects.   Thankfully, everything is holding steady.  There is no evidence of recurrent melanoma.  He is still at risk for recurrent disease.  We still have to follow him.  I think  another CT scan in 8 months would be reasonable.  I just feel bad that he has had these neurologic issues and that they will just not improve.  We will plan to get him back in 8 months.  We will do a CT scan when we see him back.    [name], MD 12/16/201911:51 AM

## 2018-06-15 DIAGNOSIS — E271 Primary adrenocortical insufficiency: Secondary | ICD-10-CM | POA: Diagnosis not present

## 2018-06-15 DIAGNOSIS — J329 Chronic sinusitis, unspecified: Secondary | ICD-10-CM | POA: Diagnosis not present

## 2018-06-15 DIAGNOSIS — Z6832 Body mass index (BMI) 32.0-32.9, adult: Secondary | ICD-10-CM | POA: Diagnosis not present

## 2018-06-15 DIAGNOSIS — J40 Bronchitis, not specified as acute or chronic: Secondary | ICD-10-CM | POA: Diagnosis not present

## 2018-06-23 ENCOUNTER — Telehealth: Payer: Self-pay | Admitting: Hematology & Oncology

## 2018-06-23 NOTE — Telephone Encounter (Signed)
MEDICAL RECORDS REQUEST   REQUEST FAXED TO:  [place] [facility]  PHONE # [phone] FAX # [phone]  REQUEST INDEXED

## 2018-07-19 DIAGNOSIS — I9589 Other hypotension: Secondary | ICD-10-CM | POA: Diagnosis not present

## 2018-07-19 DIAGNOSIS — E271 Primary adrenocortical insufficiency: Secondary | ICD-10-CM | POA: Diagnosis not present

## 2018-07-19 DIAGNOSIS — G629 Polyneuropathy, unspecified: Secondary | ICD-10-CM | POA: Diagnosis not present

## 2018-08-18 DIAGNOSIS — E271 Primary adrenocortical insufficiency: Secondary | ICD-10-CM | POA: Diagnosis not present

## 2018-08-18 DIAGNOSIS — G629 Polyneuropathy, unspecified: Secondary | ICD-10-CM | POA: Diagnosis not present

## 2018-08-18 DIAGNOSIS — E291 Testicular hypofunction: Secondary | ICD-10-CM | POA: Diagnosis not present

## 2018-09-18 DIAGNOSIS — I959 Hypotension, unspecified: Secondary | ICD-10-CM | POA: Diagnosis not present

## 2018-09-18 DIAGNOSIS — E782 Mixed hyperlipidemia: Secondary | ICD-10-CM | POA: Diagnosis not present

## 2018-10-15 ENCOUNTER — Telehealth: Payer: Self-pay | Admitting: Hematology & Oncology

## 2018-10-15 ENCOUNTER — Telehealth: Payer: Self-pay | Admitting: *Deleted

## 2018-10-15 NOTE — Telephone Encounter (Signed)
sw pt wife to confirm 4/29 appt at 315 pm per 4/27 sch msg. Date/time per pt due to being off work

## 2018-10-15 NOTE — Telephone Encounter (Signed)
Call received from patient's wife stating that patient has a "knot in his right groin area" that is "painful at times" and would like to know if patient needs an appt to come in to see Dr. Marin Olp.  Dr. Marin Olp notified and would like for patient to come in this week for labs and to see Dr. Marin Olp.  Call placed back to patient's wife to notify her of MD orders.  Message sent to scheduling.

## 2018-10-16 ENCOUNTER — Other Ambulatory Visit: Payer: Self-pay | Admitting: *Deleted

## 2018-10-16 DIAGNOSIS — C4371 Malignant melanoma of right lower limb, including hip: Secondary | ICD-10-CM

## 2018-10-17 ENCOUNTER — Inpatient Hospital Stay: Payer: PPO | Attending: Hematology & Oncology | Admitting: Hematology & Oncology

## 2018-10-17 ENCOUNTER — Inpatient Hospital Stay: Payer: PPO

## 2018-10-17 ENCOUNTER — Encounter: Payer: Self-pay | Admitting: Hematology & Oncology

## 2018-10-17 ENCOUNTER — Other Ambulatory Visit: Payer: Self-pay

## 2018-10-17 VITALS — BP 105/72 | HR 95 | Temp 98.0°F | Resp 18 | Ht 71.5 in | Wt 229.8 lb

## 2018-10-17 DIAGNOSIS — Z85820 Personal history of malignant melanoma of skin: Secondary | ICD-10-CM | POA: Diagnosis not present

## 2018-10-17 DIAGNOSIS — C4371 Malignant melanoma of right lower limb, including hip: Secondary | ICD-10-CM

## 2018-10-17 LAB — CMP (CANCER CENTER ONLY)
ALT: 13 U/L (ref 0–44)
AST: 18 U/L (ref 15–41)
Albumin: 4.6 g/dL (ref 3.5–5.0)
Alkaline Phosphatase: 72 U/L (ref 38–126)
Anion gap: 11 (ref 5–15)
BUN: 17 mg/dL (ref 6–20)
CO2: 24 mmol/L (ref 22–32)
Calcium: 10 mg/dL (ref 8.9–10.3)
Chloride: 106 mmol/L (ref 98–111)
Creatinine: 1.27 mg/dL — ABNORMAL HIGH (ref 0.61–1.24)
GFR, Est AFR Am: >60 mL/min (ref >60)
GFR, Estimated: >60 mL/min (ref >60)
Glucose, Bld: 90 mg/dL (ref 70–99)
Potassium: 3.9 mmol/L (ref 3.5–5.1)
Sodium: 141 mmol/L (ref 135–145)
Total Bilirubin: 0.6 mg/dL (ref 0.3–1.2)
Total Protein: 7.5 g/dL (ref 6.5–8.1)

## 2018-10-17 LAB — CBC WITH DIFFERENTIAL (CANCER CENTER ONLY)
Abs Immature Granulocytes: 0.01 10*3/uL (ref 0.00–0.07)
Basophils Absolute: 0 10*3/uL (ref 0.0–0.1)
Basophils Relative: 1 %
Eosinophils Absolute: 0.2 10*3/uL (ref 0.0–0.5)
Eosinophils Relative: 3 %
HCT: 43 % (ref 39.0–52.0)
Hemoglobin: 14.9 g/dL (ref 13.0–17.0)
Immature Granulocytes: 0 %
Lymphocytes Relative: 35 %
Lymphs Abs: 2 10*3/uL (ref 0.7–4.0)
MCH: 30.3 pg (ref 26.0–34.0)
MCHC: 34.7 g/dL (ref 30.0–36.0)
MCV: 87.4 fL (ref 80.0–100.0)
Monocytes Absolute: 0.5 10*3/uL (ref 0.1–1.0)
Monocytes Relative: 9 %
Neutro Abs: 3.1 10*3/uL (ref 1.7–7.7)
Neutrophils Relative %: 52 %
Platelet Count: 183 10*3/uL (ref 150–400)
RBC: 4.92 MIL/uL (ref 4.22–5.81)
RDW: 12.2 % (ref 11.5–15.5)
WBC Count: 5.9 10*3/uL (ref 4.0–10.5)
nRBC: 0 % (ref 0.0–0.2)

## 2018-10-17 MED ORDER — TADALAFIL 10 MG PO TABS: ORAL_TABLET | ORAL | 0 refills | Status: DC

## 2018-10-17 NOTE — Progress Notes (Addendum)
Hematology and Oncology Follow Up Visit  James Byrd [id number] April 20, 1966 53 y.o. 10/17/2018   Principle Diagnosis:  Stage IIIB (T3bN1aM0) subungual melanoma of the right hallux  Current Therapy:   Yervoy-status post 3 cycles - discontinued in September 2016 secondary to toxicity   Interim History:  James Byrd is here today for an early visit.  We saw him back in December.  I was supposed to see him again until August.  He called earlier this week stating he had a lump in his lower right abdomen.  I was worried about this.  I was worried that this might represent a lymph node secondary to his melanoma.  He said he noted for about 2 weeks.  It is somewhat painful.  He is also having problems with erectile dysfunction.  He has had no fever.  He has had no change in bowel or bladder habits.  He has had no nausea or vomiting.  There is been no leg swelling.  He still has the neuropathy issues but seems to be improving.  He is exercising a little bit more to try to get stronger.  When I would his last CT scan that had done back in December, I do not see anything in the abdomen that looked unusual.  Of note, he has been having problems with erectile dysfunction for quite a while.  This is quite troublesome for him.  I want his quality life to be better.  As such, we will send in a prescription for Cialis (10 mg po prn) and hopefully this will help with that.  Overall, his performance status is ECOG 2.   Medications:  Allergies as of 10/17/2018   No Known Allergies     Medication List       Accurate as of October 17, 2018  4:29 PM. Always use your most recent med list.        aspirin EC 81 MG tablet Take 81 mg by mouth daily.   augmented betamethasone dipropionate 0.05 % cream Commonly known as:  DIPROLENE-AF Apply 1 application topically 2 (two) times daily as needed.   fludrocortisone 0.1 MG tablet Commonly known as:  FLORINEF Take 0.1 mg by mouth daily.   gabapentin  300 MG capsule Commonly known as:  NEURONTIN TAKE 2 CAPSULES 3 TIMES DAILY   hydrocortisone 5 MG tablet Commonly known as:  CORTEF Take 5 mg by mouth 2 (two) times daily.   multivitamin tablet Take 1 tablet by mouth daily.   tadalafil 10 MG tablet Commonly known as:  Cialis Take 1 tablet 30 min prior to sexual activity   testosterone 50 MG/5GM (1%) Gel Commonly known as:  ANDROGEL Place 5 g onto the skin daily.   vitamin B-12 1000 MCG tablet Commonly known as:  CYANOCOBALAMIN Take 1,000 mcg by mouth daily.   vitamin C 1000 MG tablet Take 1,000 mg by mouth daily.   Vitamin D 50 MCG (2000 UT) tablet Take 2,000 Units by mouth daily.       Allergies: No Known Allergies  Past Medical History, Surgical history, Social history, and Family History were reviewed and updated.  Review of Systems: Review of Systems  Constitutional: Positive for malaise/fatigue.  HENT: Negative.   Eyes: Negative.   Respiratory: Positive for shortness of breath.   Cardiovascular: Negative.   Gastrointestinal: Positive for constipation and nausea.  Genitourinary: Negative.   Musculoskeletal: Positive for joint pain and myalgias.  Skin: Negative.   Neurological: Positive for tingling and focal weakness.  Endo/Heme/Allergies: Negative.   Psychiatric/Behavioral: Positive for memory loss.     Physical Exam:  height is 5' 11.5" (1.816 m) and weight is 229 lb 12.8 oz (104.2 kg). His oral temperature is 98 F (36.7 C). His blood pressure is 105/72 and his pulse is 95. His respiration is 18 and oxygen saturation is 99%.   Wt Readings from Last 3 Encounters:  10/17/18 229 lb 12.8 oz (104.2 kg)  06/04/18 240 lb 8 oz (109.1 kg)  05/11/18 238 lb 8 oz (108.2 kg)    Physical Exam Vitals signs reviewed.  HENT:     Head: Normocephalic and atraumatic.  Eyes:     Pupils: Pupils are equal, round, and reactive to light.  Neck:     Musculoskeletal: Normal range of motion.  Cardiovascular:     Rate  and Rhythm: Normal rate and regular rhythm.     Heart sounds: Normal heart sounds.  Pulmonary:     Effort: Pulmonary effort is normal.     Breath sounds: Normal breath sounds.  Abdominal:     General: Bowel sounds are normal.     Palpations: Abdomen is soft.     Comments: Abdominal exam shows a soft abdomen.  Bowel sounds are present.  In the right inguinal area, there does appear to be a fullness.  I am not sure if this is an inguinal hernia.  It does not appear to be all that mobile.  I tried to reduce the hernia if I could if this was the case.  I was not sure if I could do this.  The left side is unremarkable.  There is no liver or spleen tip palpation.  Musculoskeletal: Normal range of motion.        General: No tenderness or deformity.  Lymphadenopathy:     Cervical: No cervical adenopathy.  Skin:    General: Skin is warm and dry.     Findings: No erythema or rash.  Neurological:     Mental Status: He is alert and oriented to person, place, and time.  Psychiatric:        Behavior: Behavior normal.        Thought Content: Thought content normal.        Judgment: Judgment normal.      Lab Results  Component Value Date   WBC 5.9 10/17/2018   HGB 14.9 10/17/2018   HCT 43.0 10/17/2018   MCV 87.4 10/17/2018   PLT 183 10/17/2018   Lab Results  Component Value Date   FERRITIN 363 (H) 09/04/2015   IRON 55 09/04/2015   TIBC 200 (L) 09/04/2015   UIBC 145 09/04/2015   IRONPCTSAT 28 09/04/2015   Lab Results  Component Value Date   RBC 4.92 10/17/2018   No results found for: KPAFRELGTCHN, LAMBDASER, KAPLAMBRATIO No results found for: IGGSERUM, IGA, IGMSERUM No results found for: Odetta Pink, SPEI   Chemistry      Component Value Date/Time   NA 141 10/17/2018 1437   NA 145 06/06/2017 1316   NA 141 10/16/2015 1334   K 3.9 10/17/2018 1437   K 4.1 06/06/2017 1316   K 3.9 10/16/2015 1334   CL 106 10/17/2018 1437   CL  108 06/06/2017 1316   CO2 24 10/17/2018 1437   CO2 26 06/06/2017 1316   CO2 21 (L) 10/16/2015 1334   BUN 17 10/17/2018 1437   BUN 19 06/06/2017 1316   BUN 20.0 10/16/2015 1334   CREATININE  1.27 (H) 10/17/2018 1437   CREATININE 1.0 06/06/2017 1316   CREATININE 1.0 10/16/2015 1334      Component Value Date/Time   CALCIUM 10.0 10/17/2018 1437   CALCIUM 9.4 06/06/2017 1316   CALCIUM 9.7 10/16/2015 1334   ALKPHOS 72 10/17/2018 1437   ALKPHOS 78 06/06/2017 1316   ALKPHOS 62 10/16/2015 1334   AST 18 10/17/2018 1437   AST 16 10/16/2015 1334   ALT 13 10/17/2018 1437   ALT 27 06/06/2017 1316   ALT 11 10/16/2015 1334   BILITOT 0.6 10/17/2018 1437   BILITOT 0.49 10/16/2015 1334      Impression and Plan: Mr. Tomko is a very pleasant 53 yo caucasian gentleman with history of stage IIIB melanoma of the right hallux that was subungual with one microscopic positive inguinal lymph node. He completed 3 cycles of Yervoy but stopped due to side effects.   With Mr. Lave, he certainly is at risk for recurrence.  As such, we will have to do a CT of his abdomen and pelvis.  I really need to see what is going on down in that inguinal area.  I hope that this is a inguinal hernia.  If it is, then we could refer him to a surgeon for hernia repair.  If this is a lymph node, he will clearly need to have this removed and see if it does represent recurrent melanoma.  If he has recurrent melanoma, we will really be in trouble as he had an incredibly hard time with immunotherapy.  This was with Curt Bears so maybe if he took nivolumab it would not be the same.  If he has melanoma GERD we will definitely need to check for BRAF mutation.  We will keep his appointment with [place] in August.  If there is something suspicious on the CT scan, we will get him back sooner.    [name], MD 4/29/20204:29 PM     ADDENDUM: We did get the CT scan done the following day.  Unfortunately, he now has a bulky necrotic  right inguinal lymph node measuring 5.2 x 3.4 cm.  No other abnormalities were noted on the CT scan.  There is some small prominent right iliac chain and retroperitoneal lymph nodes that were unchanged from prior scan.  I spoke to Mr. and Mrs. Fuhr by phone.  I told him the results of the CT scan.  I told him that we had to get this lymph node removed.  I will speak with 1 of the surgeons in [place] to try to get this done quickly.  He will also need to have a PET scan done so that we can see if he does have metastatic disease.  The problem is that he had a incredible incredible difficult time with immunotherapy.  I know that he just got Yervoy which is a CTLA-4 inhibitor.  Hopefully, if we find that he is BRAF negative, we can use immunotherapy with nivolumab.  I spoke with Dr. Lucia Gaskins of surgery.  He will see Mr. Cassada.  He would like to have the PET scan done first so that we can see if he has any metastatic disease which would influence how Dr. Lucia Gaskins would operate on this lymph node in the right groin.  Lattie Haw, MD

## 2018-10-18 ENCOUNTER — Telehealth: Payer: Self-pay | Admitting: Hematology & Oncology

## 2018-10-18 DIAGNOSIS — C4371 Malignant melanoma of right lower limb, including hip: Secondary | ICD-10-CM | POA: Diagnosis not present

## 2018-10-18 DIAGNOSIS — E291 Testicular hypofunction: Secondary | ICD-10-CM | POA: Diagnosis not present

## 2018-10-18 DIAGNOSIS — G629 Polyneuropathy, unspecified: Secondary | ICD-10-CM | POA: Diagnosis not present

## 2018-10-18 LAB — LACTATE DEHYDROGENASE: LDH: 201 U/L — ABNORMAL HIGH (ref 98–192)

## 2018-10-18 NOTE — Telephone Encounter (Signed)
sw pt to confirm stat CT Scan at [facility] today at [place] am. [place] #7

## 2018-10-18 NOTE — Addendum Note (Signed)
Addended by: Burney Gauze R on: 10/18/2018 03:20 PM   Modules accepted: Orders

## 2018-10-22 ENCOUNTER — Other Ambulatory Visit: Payer: Self-pay | Admitting: Surgery

## 2018-10-22 ENCOUNTER — Telehealth: Payer: Self-pay | Admitting: *Deleted

## 2018-10-22 DIAGNOSIS — C4371 Malignant melanoma of right lower limb, including hip: Secondary | ICD-10-CM | POA: Diagnosis not present

## 2018-10-22 DIAGNOSIS — R590 Localized enlarged lymph nodes: Secondary | ICD-10-CM | POA: Diagnosis not present

## 2018-10-22 DIAGNOSIS — I770 Arteriovenous fistula, acquired: Secondary | ICD-10-CM | POA: Diagnosis not present

## 2018-10-22 NOTE — Telephone Encounter (Signed)
Message received from patient's wife to get PET scan scheduled for patient.  Message sent to scheduling.

## 2018-10-30 ENCOUNTER — Ambulatory Visit
Admission: RE | Admit: 2018-10-30 | Discharge: 2018-10-30 | Disposition: A | Discharge status: Home / Self Care | Payer: PPO | Source: Ambulatory Visit | Attending: Hematology & Oncology | Admitting: Hematology & Oncology

## 2018-10-30 ENCOUNTER — Other Ambulatory Visit: Payer: Self-pay

## 2018-10-30 DIAGNOSIS — J320 Chronic maxillary sinusitis: Secondary | ICD-10-CM | POA: Insufficient documentation

## 2018-10-30 DIAGNOSIS — I700 Atherosclerosis of aorta: Secondary | ICD-10-CM | POA: Insufficient documentation

## 2018-10-30 DIAGNOSIS — C4371 Malignant melanoma of right lower limb, including hip: Secondary | ICD-10-CM | POA: Insufficient documentation

## 2018-10-30 DIAGNOSIS — K7689 Other specified diseases of liver: Secondary | ICD-10-CM | POA: Diagnosis not present

## 2018-10-30 DIAGNOSIS — R590 Localized enlarged lymph nodes: Secondary | ICD-10-CM | POA: Insufficient documentation

## 2018-10-30 DIAGNOSIS — C439 Malignant melanoma of skin, unspecified: Secondary | ICD-10-CM | POA: Diagnosis not present

## 2018-10-30 LAB — GLUCOSE, CAPILLARY: Glucose-Capillary: 89 mg/dL (ref 70–99)

## 2018-10-30 MED ORDER — FLUDEOXYGLUCOSE F - 18 (FDG) INJECTION
12.7400 | Freq: Once | INTRAVENOUS | Status: AC | PRN
  Administered 2018-10-30: 12.74 via INTRAVENOUS

## 2018-10-31 ENCOUNTER — Other Ambulatory Visit: Payer: Self-pay | Admitting: Surgery

## 2018-11-01 ENCOUNTER — Other Ambulatory Visit: Payer: Self-pay | Admitting: Surgery

## 2018-11-01 ENCOUNTER — Other Ambulatory Visit (HOSPITAL_COMMUNITY): Payer: Self-pay | Admitting: Surgery

## 2018-11-02 ENCOUNTER — Other Ambulatory Visit: Payer: Self-pay

## 2018-11-02 ENCOUNTER — Encounter (HOSPITAL_COMMUNITY): Payer: Self-pay

## 2018-11-02 ENCOUNTER — Encounter (HOSPITAL_COMMUNITY)
Admission: RE | Admit: 2018-11-02 | Discharge: 2018-11-02 | Disposition: A | Discharge status: Home / Self Care | Payer: PPO | Source: Ambulatory Visit | Attending: Surgery | Admitting: Surgery

## 2018-11-02 ENCOUNTER — Other Ambulatory Visit (HOSPITAL_COMMUNITY)
Admission: RE | Admit: 2018-11-02 | Discharge: 2018-11-02 | Disposition: A | Discharge status: Home / Self Care | Payer: PPO | Source: Ambulatory Visit | Attending: Surgery | Admitting: Surgery

## 2018-11-02 DIAGNOSIS — I10 Essential (primary) hypertension: Secondary | ICD-10-CM | POA: Insufficient documentation

## 2018-11-02 DIAGNOSIS — Z1159 Encounter for screening for other viral diseases: Secondary | ICD-10-CM | POA: Insufficient documentation

## 2018-11-02 DIAGNOSIS — Z01818 Encounter for other preprocedural examination: Secondary | ICD-10-CM | POA: Insufficient documentation

## 2018-11-02 HISTORY — DX: Other complications of anesthesia, initial encounter: T88.59XA

## 2018-11-02 HISTORY — DX: Personal history of urinary calculi: Z87.442

## 2018-11-02 HISTORY — DX: Unspecified adrenocortical insufficiency: E27.40

## 2018-11-02 LAB — BASIC METABOLIC PANEL
Anion gap: 8 (ref 5–15)
BUN: 16 mg/dL (ref 6–20)
CO2: 25 mmol/L (ref 22–32)
Calcium: 9.2 mg/dL (ref 8.9–10.3)
Chloride: 107 mmol/L (ref 98–111)
Creatinine, Ser: 0.95 mg/dL (ref 0.61–1.24)
GFR calc Af Amer: >60 mL/min (ref >60)
GFR calc non Af Amer: >60 mL/min (ref >60)
Glucose, Bld: 87 mg/dL (ref 70–99)
Potassium: 4.1 mmol/L (ref 3.5–5.1)
Sodium: 140 mmol/L (ref 135–145)

## 2018-11-02 LAB — CBC
HCT: 44.6 % (ref 39.0–52.0)
Hemoglobin: 14.4 g/dL (ref 13.0–17.0)
MCH: 29.3 pg (ref 26.0–34.0)
MCHC: 32.3 g/dL (ref 30.0–36.0)
MCV: 90.8 fL (ref 80.0–100.0)
Platelets: 184 10*3/uL (ref 150–400)
RBC: 4.91 MIL/uL (ref 4.22–5.81)
RDW: 12.3 % (ref 11.5–15.5)
WBC: 4.2 10*3/uL (ref 4.0–10.5)
nRBC: 0 % (ref 0.0–0.2)

## 2018-11-02 NOTE — Patient Instructions (Addendum)
James Byrd  11/02/2018   Your procedure is scheduled on: 11-02-2018  Report to [facility] Main  Entrance  Report to admitting at 900 AM   YOU HAD   A COVID 19 TEST today,    Call this number if you have problems the morning of surgery [phone]   Remember: Do not eat food or drink liquids :After Midnight. BRUSH YOUR TEETH MORNING OF SURGERY AND RINSE YOUR MOUTH OUT, NO CHEWING GUM CANDY OR MINTS.     Take these medicines the morning of surgery with A SIP OF WATER: gabapentin (nuerontin), fludocortisone (florinef), hydrocortisone (cortef)                                You may not have any metal on your body including hair pins and              piercings  Do not wear jewelry, make-up, lotions, powders or perfumes, deodorant             Do not wear nail polish.  Do not shave  48 hours prior to surgery.              Men may shave face and neck.   Do not bring valuables to the hospital. Graball.  Contacts, dentures or bridgework may not be worn into surgery.  Leave suitcase in the car. After surgery it may be brought to your room.     _____________________________________________________________________             [facility] - Preparing for Surgery Before surgery, you can play an important role.  Because skin is not sterile, your skin needs to be as free of germs as possible.  You can reduce the number of germs on your skin by washing with CHG (chlorahexidine gluconate) soap before surgery.  CHG is an antiseptic cleaner which kills germs and bonds with the skin to continue killing germs even after washing. Please DO NOT use if you have an allergy to CHG or antibacterial soaps.  If your skin becomes reddened/irritated stop using the CHG and inform your nurse when you arrive at Short Stay. Do not shave (including legs and underarms) for at least 48 hours prior to the first CHG shower.  You may shave your  face/neck. Please follow these instructions carefully:  1.  Shower with CHG Soap the night before surgery and the  morning of Surgery.  2.  If you choose to wash your hair, wash your hair first as usual with your  normal  shampoo.  3.  After you shampoo, rinse your hair and body thoroughly to remove the  shampoo.                           4.  Use CHG as you would any other liquid soap.  You can apply chg directly  to the skin and wash                       Gently with a scrungie or clean washcloth.  5.  Apply the CHG Soap to your body ONLY FROM THE NECK DOWN.   Do not use on face/ open  Wound or open sores. Avoid contact with eyes, ears mouth and genitals (private parts).                       Wash face,  Genitals (private parts) with your normal soap.             6.  Wash thoroughly, paying special attention to the area where your surgery  will be performed.  7.  Thoroughly rinse your body with warm water from the neck down.  8.  DO NOT shower/wash with your normal soap after using and rinsing off  the CHG Soap.                9.  Pat yourself dry with a clean towel.            10.  Wear clean pajamas.            11.  Place clean sheets on your bed the night of your first shower and do not  sleep with pets. Day of Surgery : Do not apply any lotions/deodorants the morning of surgery.  Please wear clean clothes to the hospital/surgery center.  FAILURE TO FOLLOW THESE INSTRUCTIONS MAY RESULT IN THE CANCELLATION OF YOUR SURGERY PATIENT SIGNATURE_________________________________  NURSE SIGNATURE__________________________________  ________________________________________________________________________

## 2018-11-03 LAB — NOVEL CORONAVIRUS, NAA (HOSP ORDER, SEND-OUT TO REF LAB; TAT 18-24 HRS): SARS-CoV-2, NAA: NOT DETECTED

## 2018-11-04 NOTE — H&P (Signed)
James Byrd  Location: [facility] Surgery Patient #: [id number] DOB: 1965-12-03 Married / Language: English / Race: White Male  History of Present Illness   The patient is a 53 year old male who presents with a complaint of inguinal adenopathy.  The PCP is Dr. Rod Holler.  The patient was referred by Dr. Pearletha Alfred His wife is with him.  [The Covid-19 virus has disrupted normal medical care in [place] and across the nation. We have sometimes had to alter normal surgical/medical care to limit this epidemic and we have explained these changes to the patient.]  The patient has noticed an increasing mass in his right groin over the last 2-3 weeks. He is followed by Dr. Marin Olp for melanoma. At first this was thought to be a possible inguinal hernia. However he had a CT scan done in [place] that shows a right inguinal mass. The CT scan of the abdomen on 10/17/2018 - There are new, bulky and necrotic right inguinal lymph nodes measuring up to 5.2 x 3.4 cm (series 2, image 91). Small prominent right iliac chain and retroperitoneal lymph nodes are unchanged compared to prior examination dated 08/27/2017 (series 2, image 74, 43). Findings are highly concerning for nodal metastatic diseas given history of right lower extremity melanoma. He has a host of other issues including neuropathy and a weakened immune system that he blames on his prior treatment. He was somewhat angry at today's evaluation - primarily as how slow he thinks that things are going, though I tried to explain to him our plan. I outlined the risks of surgery including bleeding, infection, nerve injury, and chronic lymphedema. The greater the lymph node dissection the greater the risk of lymphedema.  Plan: 1) PET scan for extent of disease, 2) Right inguinal lymph node excision/dissection - schedule after PET scan  History of melanoma His melanoma was found in 2015 in his right great toe.  He had surgery 10/13/2014 by Dr. Raliegh Ip. Votanopoulos. He had a right inguinal sentinel lymph node biopsy. 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 He has been treated with ipilimumab Curt Bears), but developed toxicity after 3 cycles. But it seems he was hospitalized from Sept 16 - 26, 2016 for drug induced pneumonia. He had a negative CT scan of chest/abdomen in 06/04/2018. There was a question of a right femoral vein AV fistula. CT scan of chest/abdomen (there is not an official report in Epic) per Dr. Marin Olp - 5.2 x 3.4 cm right inguinal node. PET scan pending.  Review of Systems as stated in this history (HPI) or in the review of systems. Otherwise all other 12 point ROS are negative  Past Medical History: 1. Peripheral neuropathy Dr. Narda Amber - 12/08/2015 2. GERD 3. Depression 4. SVT History of prior ablation (? cardiologist) 5. Cholecystectomy - 2019 6. Colonoscopy - [place] - getting seen every 5 years 7. Kidney stones once 8. Admitted one year ago (2019.3) for pnuemonia from flu 9. Right groin AV fistula I ran this by Dr. Donnetta Hutching. He said that this is usually an incidental finding and that nothing further needs to be done.  Social History: Married He has a daughter who works for [employer]. His son works for finances in [place].  He is on disability since his melanoma surgery. He has a brother who has metastatic prostate cancer.  Past Surgical History (April Staton, CMA; 10/22/2018 9:01 AM) Gallbladder Surgery - Open   Diagnostic  Studies History (April Staton, CMA; 10/22/2018 9:01 AM) Colonoscopy  1-5 years ago  Allergies (April Staton, CMA; 10/22/2018 9:03 AM) No Known Drug Allergies [10/22/2018]:  Medication History (April Staton, CMA; 10/22/2018 9:05 AM) Testosterone (20.25 MG/ACT(1.62%) Gel, Transdermal)  Active. Fludrocortisone Acetate (0.1MG  Tablet, Oral) Active. Gabapentin (600MG  Tablet, Oral) Active. Hydrocortisone (5MG  Tablet, Oral) Active. Vitamin D (1000UNIT Capsule, Oral) Active. Vitamin C (1000MG  Tablet, Oral) Active. Multi-Vitamin (Oral) Active. Vitamin B-12 (1000MCG Tablet, Oral) Active. Medications Reconciled  Social History (April Staton, CMA; 10/22/2018 9:01 AM) Alcohol use  Remotely quit alcohol use. Caffeine use  Carbonated beverages, Tea. No drug use  Tobacco use  Never smoker.  Family History (April Staton, [place]; 10/22/2018 9:01 AM) Arthritis  Mother. Cancer  Brother. Cerebrovascular Accident  Brother, Mother. Depression  Mother. Diabetes Mellitus  Brother, Father. Heart Disease  Father, Mother. Hypertension  Mother. Prostate Cancer  Brother. Respiratory Condition  Father. Thyroid problems  Mother, Sister.  Other Problems (April Staton, CMA; 10/22/2018 9:01 AM) Back Pain  Chronic Renal Failure Syndrome  Depression  Kidney Stone  Melanoma    Review of Systems (April Staton CMA; 10/22/2018 9:01 AM) General Not Present- Appetite Loss, Chills, Fatigue, Fever, Night Sweats, Weight Gain and Weight Loss. Skin Not Present- Change in Wart/Mole, Dryness, Hives, Jaundice, New Lesions, Non-Healing Wounds, Rash and Ulcer. HEENT Not Present- Earache, Hearing Loss, Hoarseness, Nose Bleed, Oral Ulcers, Ringing in the Ears, Seasonal Allergies, Sinus Pain, Sore Throat, Visual Disturbances, Wears glasses/contact lenses and Yellow Eyes. Respiratory Not Present- Bloody sputum, Chronic Cough, Difficulty Breathing, Snoring and Wheezing. Breast Not Present- Breast Mass, Breast Pain, Nipple Discharge and Skin Changes. Cardiovascular Not Present- Chest Pain, Difficulty Breathing Lying Down, Leg Cramps, Palpitations, Rapid Heart Rate, Shortness of Breath and Swelling of Extremities. Gastrointestinal Not Present- Abdominal Pain, Bloating, Bloody Stool, Change in  Bowel Habits, Chronic diarrhea, Constipation, Difficulty Swallowing, Excessive gas, Gets full quickly at meals, Hemorrhoids, Indigestion, Nausea, Rectal Pain and Vomiting. Male Genitourinary Present- Urine Leakage. Not Present- Blood in Urine, Change in Urinary Stream, Frequency, Impotence, Nocturia, Painful Urination and Urgency. Musculoskeletal Not Present- Back Pain, Joint Pain, Joint Stiffness, Muscle Pain, Muscle Weakness and Swelling of Extremities. Neurological Not Present- Decreased Memory, Fainting, Headaches, Numbness, Seizures, Tingling, Tremor, Trouble walking and Weakness. Psychiatric Not Present- Anxiety, Bipolar, Change in Sleep Pattern, Depression, Fearful and Frequent crying. Endocrine Not Present- Cold Intolerance, Excessive Hunger, Hair Changes, Heat Intolerance, Hot flashes and New Diabetes. Hematology Not Present- Blood Thinners, Easy Bruising, Excessive bleeding, Gland problems, HIV and Persistent Infections.  Vitals (April Staton CMA; 10/22/2018 9:06 AM) 10/22/2018 9:05 AM Weight: 227.5 lb Height: 72in Body Surface Area: 2.25 m Body Mass Index: 30.85 kg/m  Temp.: 97.30F(Oral)  Pulse: 74 (Regular)  BP: 104/80 (Sitting, Left Arm, Standard)   Physical Exam  General: [name] who isalert and generally healthy appearing. He is wearing a mask. Skin: Inspection and palpation of the skin unremarkable.  Eyes: Conjunctivae white, pupils equal. Face, ears, nose, mouth, and throat: Face - normal. Normal ears and nose. Lips and teeth normal.  Neck: Supple. No mass. Trachea midline. No thyroid mass.  Lymph Nodes: No supraclavicular or cervical adenopathy. No axillary adenopathy. He has a 4 cm mass in the right groin, consistent with what is seen on CT scan. He has an incision in the groin that he thought was related to his ablation, but I think it is more related to his sentinel lymph node biopsy (I found on Care Everywhere)  Lungs: Normal respiratory  effort.  Clear to auscultation and symmetric breath sounds. Cardiovascular: Regular rate and rythm. Normal auscultation of the heart. No murmur or rub.  Abdomen: Soft. No mass. Liver and spleen not palpable. No tenderness. No hernia. Normal bowel sounds. No abdominal scars. Rectal: Not done.  Musculoskeletal/extremities: Normal gait.  Good strength and ROM in upper and lower extremities.   He is missing the distail phalanx of his right great toe from the surgery for his original melanoma.  Neurologic: Grossly intact to motor and sensory function.   Psychiatric: Has normal mood and affect. Judgement and insight appear normal.   Assessment & Plan  1.  INGUINAL ADENOPATHY (R59.0) - probable recurrent melanoma  Plan:   1) PET scan for extent of disease   PET scan on 10/30/2018 showed disease only in right groin.  2) Right inguinal lymph node excision/dissection - schedule after PET scan  2.  MALIGNANT MELANOMA OF TOE OF RIGHT FOOT (C43.71) 3.  Right groin AV FISTULA (I77.0)  Discussed with Dr. Darene Lamer. Early. No further intervention.  4. Peripheral neuropathy  Dr. Narda Amber - 12/08/2015 5. GERD 6. Depression 7. SVT  History of prior ablation (? cardiologist)   Alphonsa Overall, MD, [facility] Surgery Pager: [phone] Office phone:  [phone]

## 2018-11-05 NOTE — Anesthesia Preprocedure Evaluation (Addendum)
Anesthesia Evaluation  Patient identified by MRN, date of birth, ID band Patient awake    Reviewed: Allergy & Precautions, NPO status , Patient's Chart, lab work & pertinent test results  Airway Mallampati: II  TM Distance: >3 FB Neck ROM: Full    Dental no notable dental hx.    Pulmonary neg pulmonary ROS,    Pulmonary exam normal breath sounds clear to auscultation       Cardiovascular negative cardio ROS Normal cardiovascular exam Rhythm:Regular Rate:Normal     Neuro/Psych negative neurological ROS  negative psych ROS   GI/Hepatic negative GI ROS, Neg liver ROS,   Endo/Other  AI  Renal/GU negative Renal ROS  negative genitourinary   Musculoskeletal negative musculoskeletal ROS (+)   Abdominal   Peds negative pediatric ROS (+)  Hematology negative hematology ROS (+)   Anesthesia Other Findings   Reproductive/Obstetrics negative OB ROS                             Anesthesia Physical Anesthesia Plan  ASA: III  Anesthesia Plan: General   Post-op Pain Management:    Induction: Intravenous and Rapid sequence  PONV Risk Score and Plan: Ondansetron, Dexamethasone and Treatment may vary due to age or medical condition  Airway Management Planned: Oral ETT  Additional Equipment:   Intra-op Plan:   Post-operative Plan: Extubation in OR  Informed Consent: I have reviewed the patients History and Physical, chart, labs and discussed the procedure including the risks, benefits and alternatives for the proposed anesthesia with the patient or authorized representative who has indicated his/her understanding and acceptance.     Dental advisory given  Plan Discussed with: CRNA and Surgeon  Anesthesia Plan Comments: (See PAT note 11/02/18, Konrad Felix, PA-C)       Anesthesia Quick Evaluation

## 2018-11-05 NOTE — [facility] Notes (Signed)
Anesthesia Chart Review   Case:  [id number] Date/Time:  11/06/18 1100   Procedure:  RIGHT INGUINAL LYMPH NODE BIOPSY, RIGHT GROIN NODE POSSIBLE DISECTION (Right )   Anesthesia type:  General   Pre-op diagnosis:  Right inguinal adenopathy   Location:  [facility]   Surgeon:  Alphonsa Overall, MD      DISCUSSION: 53 yo never smoker with h/o anxiety, HLD, adrenal insufficiency, gout, PSVT s/p ablation so episodes since, Stage IIIB subungual melanoma of right hallux (s/o amputation and adjuvant chemo), right inguinal adenopathy scheduled for above procedure 11/06/18 with Dr. Alphonsa Overall.   Pt reports slow to wake after anesthesia 8-9 years ago.    Pt experienced SVT during a treadmill test, asymptomatic, started on metoprolol.  Event recorder with no arrhythmia noted.  Last seen by Cardiologist, Dr. Sherryl Barters, 09/06/17.  Per his note pt s/p ablation 5 years ago.  Stable at this visit.    Pt can proceed with planned procedure barring acute status change.  VS: BP 124/73 (BP Location: Left Arm)   Pulse 68   Temp 37.1 C (Oral)   Resp 18   Ht 5' 11.5" (1.816 m)   Wt 105 kg   SpO2 100%   BMI 31.84 kg/m   PROVIDERS: Street, Sharon Mt, MD is PCP   Burney Gauze, MD is Oncologist   Wyline Copas, Sharalyn Ink, MD is Cardiologist  LABS: Labs reviewed: Acceptable for surgery. (all labs ordered are listed, but only abnormal results are displayed)  Labs Reviewed  BASIC METABOLIC PANEL  CBC     IMAGES: 06/04/18 CT Chest  IMPRESSION: 1. Stable CT appearance of the chest, abdomen and pelvis. No findings suspicious for metastatic disease. 2. Stable hepatic and renal cysts. 3. Suspect a small AV fistula involving the right common femoral vein. This is unchanged since 2016 and is of doubtful clinical significance.  EKG: 11/02/2018 Rate 64 bpm Normal sinus rhythm Septal infarct, age undetermined No significant change since last tracing   CV: Echo 08/28/17 SUMMARY Poor  image quality and off axes images The left ventricular size is normal.  Left ventricular systolic function is normal. LV ejection fraction = 60-65%. RV appears mild to moderatelt dilated The right ventricular systolic function is normal. No significant stenosis or regurgitation seen There was insufficient TR detected to calculate RV systolic pressure. The inferior vena cava was not visualized during the exam. There is no pericardial effusion. There is no comparison study available. - FINDINGS:  LEFT VENTRICLE The left ventricular size is normal. There is normal left ventricular wall  thickness. Left ventricular systolic function is normal. LV ejection fraction  = 60-65%. Left ventricular filling pattern is indeterminate. The left  ventricular wall motion is normal.  Myocardial Perfusion 03/16/2012 Conclusions:  1. Negative treadmill EKG for ischemia at a high workload.   Past Medical History:  Diagnosis Date  . Adrenal insufficiency ([place])   . Anxiety 05/24/2012  . Complication of anesthesia    SLO TO AWAKEN MANY 8 TO 9 YRS AGO, NO ISSUES SINCE  . Gout   . History of kidney stones   . HLD (hyperlipidemia)   . Malignant melanoma of right great toe ([place]) 11/14/2014  . Paroxysmal supraventricular tachycardia ([place]) 05/24/2012   Described in the treadmill report; details are pending     Past Surgical History:  Procedure Laterality Date  . ABLATION  8 YRS AGO  . CHOLECYSTECTOMY    . EXTRACORPOREAL SHOCK WAVE LITHOTRIPSY  5  YRS AGO  . r toe partial ambutation    . ROTATOR CUFF REPAIR Right 2010   3 yrs ago    MEDICATIONS: . Ascorbic Acid (VITAMIN C) 1000 MG tablet  . aspirin-sod bicarb-citric acid (ALKA-SELTZER) 325 MG TBEF tablet  . augmented betamethasone dipropionate (DIPROLENE-AF) 0.05 % cream  . Calcium Carb-Cholecalciferol (CALCIUM 600 + D PO)  . Cholecalciferol (VITAMIN D) 2000 units tablet  . fludrocortisone (FLORINEF) 0.1 MG tablet  . gabapentin (NEURONTIN) 300 MG  capsule  . gabapentin (NEURONTIN) 600 MG tablet  . hydrocortisone (CORTEF) 5 MG tablet  . Multiple Vitamin (MULTIVITAMIN) tablet  . SUPER B COMPLEX/C PO  . tadalafil (CIALIS) 10 MG tablet  . testosterone (ANDROGEL) 50 MG/5GM (1%) GEL  . Tetrahydrozoline HCl (VISINE OP)   No current facility-administered medications for this encounter.    Marland Kitchen 0.9 %  sodium chloride infusion   Maia Plan [facility] Pre-Surgical Testing [phone] 11/05/18 10:10 AM

## 2018-11-06 ENCOUNTER — Encounter (HOSPITAL_COMMUNITY)
Admission: RE | Disposition: A | Discharge status: Home / Self Care | Payer: Self-pay | Source: Home / Self Care | Attending: Surgery

## 2018-11-06 ENCOUNTER — Other Ambulatory Visit: Payer: Self-pay

## 2018-11-06 ENCOUNTER — Observation Stay (HOSPITAL_COMMUNITY)
Admission: RE | Admit: 2018-11-06 | Discharge: 2018-11-06 | Disposition: A | Discharge status: Home / Self Care | Payer: PPO | Attending: Surgery | Admitting: Surgery

## 2018-11-06 ENCOUNTER — Encounter (HOSPITAL_COMMUNITY): Payer: Self-pay | Admitting: *Deleted

## 2018-11-06 ENCOUNTER — Ambulatory Visit (HOSPITAL_COMMUNITY): Payer: PPO | Admitting: Physician Assistant

## 2018-11-06 ENCOUNTER — Ambulatory Visit (HOSPITAL_COMMUNITY): Payer: PPO | Admitting: Certified Registered"

## 2018-11-06 DIAGNOSIS — Z85820 Personal history of malignant melanoma of skin: Secondary | ICD-10-CM | POA: Insufficient documentation

## 2018-11-06 DIAGNOSIS — K219 Gastro-esophageal reflux disease without esophagitis: Secondary | ICD-10-CM | POA: Insufficient documentation

## 2018-11-06 DIAGNOSIS — C774 Secondary and unspecified malignant neoplasm of inguinal and lower limb lymph nodes: Secondary | ICD-10-CM | POA: Diagnosis not present

## 2018-11-06 DIAGNOSIS — N186 End stage renal disease: Secondary | ICD-10-CM | POA: Diagnosis not present

## 2018-11-06 DIAGNOSIS — F329 Major depressive disorder, single episode, unspecified: Secondary | ICD-10-CM | POA: Diagnosis not present

## 2018-11-06 DIAGNOSIS — Z8042 Family history of malignant neoplasm of prostate: Secondary | ICD-10-CM | POA: Diagnosis not present

## 2018-11-06 DIAGNOSIS — R590 Localized enlarged lymph nodes: Secondary | ICD-10-CM | POA: Diagnosis not present

## 2018-11-06 DIAGNOSIS — I770 Arteriovenous fistula, acquired: Secondary | ICD-10-CM | POA: Insufficient documentation

## 2018-11-06 DIAGNOSIS — F419 Anxiety disorder, unspecified: Secondary | ICD-10-CM | POA: Diagnosis not present

## 2018-11-06 DIAGNOSIS — G629 Polyneuropathy, unspecified: Secondary | ICD-10-CM | POA: Insufficient documentation

## 2018-11-06 DIAGNOSIS — Z79899 Other long term (current) drug therapy: Secondary | ICD-10-CM | POA: Diagnosis not present

## 2018-11-06 DIAGNOSIS — N179 Acute kidney failure, unspecified: Secondary | ICD-10-CM | POA: Diagnosis not present

## 2018-11-06 DIAGNOSIS — C439 Malignant melanoma of skin, unspecified: Secondary | ICD-10-CM | POA: Diagnosis not present

## 2018-11-06 DIAGNOSIS — C4371 Malignant melanoma of right lower limb, including hip: Secondary | ICD-10-CM | POA: Diagnosis present

## 2018-11-06 DIAGNOSIS — I5032 Chronic diastolic (congestive) heart failure: Secondary | ICD-10-CM | POA: Diagnosis not present

## 2018-11-06 DIAGNOSIS — Z9049 Acquired absence of other specified parts of digestive tract: Secondary | ICD-10-CM | POA: Diagnosis not present

## 2018-11-06 DIAGNOSIS — I471 Supraventricular tachycardia: Secondary | ICD-10-CM | POA: Diagnosis not present

## 2018-11-06 HISTORY — PX: LYMPH NODE BIOPSY: SHX201

## 2018-11-06 SURGERY — LYMPH NODE BIOPSY
Anesthesia: General | Site: Inguinal | Laterality: Right

## 2018-11-06 MED ORDER — EPHEDRINE 5 MG/ML INJ
INTRAVENOUS | Status: AC
  Filled 2018-11-06: qty 10

## 2018-11-06 MED ORDER — PROMETHAZINE HCL 25 MG/ML IJ SOLN: 6.2500 mg | INTRAMUSCULAR | Status: DC | PRN

## 2018-11-06 MED ORDER — ALBUTEROL SULFATE HFA 108 (90 BASE) MCG/ACT IN AERS
INHALATION_SPRAY | RESPIRATORY_TRACT | Status: AC
  Filled 2018-11-06: qty 6.7

## 2018-11-06 MED ORDER — MIDAZOLAM HCL 2 MG/2ML IJ SOLN
INTRAMUSCULAR | Status: AC
  Filled 2018-11-06: qty 2

## 2018-11-06 MED ORDER — SUGAMMADEX SODIUM 200 MG/2ML IV SOLN
INTRAVENOUS | Status: AC
  Filled 2018-11-06: qty 2

## 2018-11-06 MED ORDER — ONDANSETRON HCL 4 MG/2ML IJ SOLN
INTRAMUSCULAR | Status: DC | PRN
  Administered 2018-11-06: 4 mg via INTRAVENOUS

## 2018-11-06 MED ORDER — KETOROLAC TROMETHAMINE 30 MG/ML IJ SOLN: 30.0000 mg | Freq: Once | INTRAMUSCULAR | Status: DC | PRN

## 2018-11-06 MED ORDER — KETAMINE HCL 10 MG/ML IJ SOLN
INTRAMUSCULAR | Status: DC | PRN
  Administered 2018-11-06: 25 mg via INTRAVENOUS

## 2018-11-06 MED ORDER — SUCCINYLCHOLINE CHLORIDE 200 MG/10ML IV SOSY
PREFILLED_SYRINGE | INTRAVENOUS | Status: AC
  Filled 2018-11-06: qty 10

## 2018-11-06 MED ORDER — MORPHINE SULFATE (PF) 4 MG/ML IV SOLN: 1.0000 mg | INTRAVENOUS | Status: DC | PRN

## 2018-11-06 MED ORDER — OXYCODONE HCL 5 MG/5ML PO SOLN: 5.0000 mg | Freq: Once | ORAL | Status: DC | PRN

## 2018-11-06 MED ORDER — HYDROMORPHONE HCL 1 MG/ML IJ SOLN
0.2500 mg | INTRAMUSCULAR | Status: DC | PRN
  Administered 2018-11-06 (×4): 0.5 mg via INTRAVENOUS

## 2018-11-06 MED ORDER — EPHEDRINE SULFATE-NACL 50-0.9 MG/10ML-% IV SOSY
PREFILLED_SYRINGE | INTRAVENOUS | Status: DC | PRN
  Administered 2018-11-06: 10 mg via INTRAVENOUS
  Administered 2018-11-06 (×2): 5 mg via INTRAVENOUS

## 2018-11-06 MED ORDER — PHENYLEPHRINE 40 MCG/ML (10ML) SYRINGE FOR IV PUSH (FOR BLOOD PRESSURE SUPPORT)
PREFILLED_SYRINGE | INTRAVENOUS | Status: DC | PRN
  Administered 2018-11-06: 80 ug via INTRAVENOUS
  Administered 2018-11-06: 40 ug via INTRAVENOUS
  Administered 2018-11-06 (×3): 80 ug via INTRAVENOUS
  Administered 2018-11-06: 120 ug via INTRAVENOUS
  Administered 2018-11-06: 80 ug via INTRAVENOUS

## 2018-11-06 MED ORDER — SUGAMMADEX SODIUM 200 MG/2ML IV SOLN
INTRAVENOUS | Status: DC | PRN
  Administered 2018-11-06: 300 mg via INTRAVENOUS

## 2018-11-06 MED ORDER — GABAPENTIN 300 MG PO CAPS: 600.0000 mg | ORAL_CAPSULE | Freq: Two times a day (BID) | ORAL | Status: DC

## 2018-11-06 MED ORDER — ROCURONIUM BROMIDE 10 MG/ML (PF) SYRINGE
PREFILLED_SYRINGE | INTRAVENOUS | Status: AC
  Filled 2018-11-06: qty 10

## 2018-11-06 MED ORDER — FLUDROCORTISONE ACETATE 0.1 MG PO TABS: 0.1000 mg | ORAL_TABLET | Freq: Every day | ORAL | Status: DC

## 2018-11-06 MED ORDER — ENOXAPARIN SODIUM 40 MG/0.4ML ~~LOC~~ SOLN: 40.0000 mg | SUBCUTANEOUS | Status: DC

## 2018-11-06 MED ORDER — DEXAMETHASONE SODIUM PHOSPHATE 10 MG/ML IJ SOLN
INTRAMUSCULAR | Status: DC | PRN
  Administered 2018-11-06: 8 mg via INTRAVENOUS

## 2018-11-06 MED ORDER — SODIUM CHLORIDE 0.9 % IV SOLN
INTRAVENOUS | Status: DC | PRN
  Administered 2018-11-06: 40 ug/min via INTRAVENOUS

## 2018-11-06 MED ORDER — GLYCOPYRROLATE PF 0.2 MG/ML IJ SOSY
PREFILLED_SYRINGE | INTRAMUSCULAR | Status: AC
  Filled 2018-11-06: qty 2

## 2018-11-06 MED ORDER — CHLORHEXIDINE GLUCONATE CLOTH 2 % EX PADS: 6.0000 | MEDICATED_PAD | Freq: Once | CUTANEOUS | Status: DC

## 2018-11-06 MED ORDER — ACETAMINOPHEN 500 MG PO TABS
1000.0000 mg | ORAL_TABLET | ORAL | Status: AC
  Administered 2018-11-06: 10:00:00 1000 mg via ORAL
  Filled 2018-11-06: qty 2

## 2018-11-06 MED ORDER — GLYCOPYRROLATE PF 0.2 MG/ML IJ SOSY
PREFILLED_SYRINGE | INTRAMUSCULAR | Status: DC | PRN
  Administered 2018-11-06: .2 mg via INTRAVENOUS

## 2018-11-06 MED ORDER — HYDROCORTISONE 5 MG PO TABS
5.0000 mg | ORAL_TABLET | Freq: Two times a day (BID) | ORAL | Status: DC
  Filled 2018-11-06: qty 1

## 2018-11-06 MED ORDER — PROPOFOL 10 MG/ML IV BOLUS
INTRAVENOUS | Status: DC | PRN
  Administered 2018-11-06: 120 mg via INTRAVENOUS

## 2018-11-06 MED ORDER — SODIUM CHLORIDE 0.9 % IR SOLN
Status: DC | PRN
  Administered 2018-11-06 (×2): 1000 mL

## 2018-11-06 MED ORDER — FENTANYL CITRATE (PF) 100 MCG/2ML IJ SOLN
INTRAMUSCULAR | Status: DC | PRN
  Administered 2018-11-06 (×3): 50 ug via INTRAVENOUS

## 2018-11-06 MED ORDER — TRAMADOL HCL 50 MG PO TABS: 50.0000 mg | ORAL_TABLET | Freq: Four times a day (QID) | ORAL | 1 refills | Status: DC | PRN

## 2018-11-06 MED ORDER — KCL IN DEXTROSE-NACL 20-5-0.45 MEQ/L-%-% IV SOLN
INTRAVENOUS | Status: DC
  Administered 2018-11-06: 15:00:00 via INTRAVENOUS
  Filled 2018-11-06: qty 1000

## 2018-11-06 MED ORDER — HYDROMORPHONE HCL 1 MG/ML IJ SOLN
INTRAMUSCULAR | Status: AC
  Filled 2018-11-06: qty 1

## 2018-11-06 MED ORDER — LIDOCAINE 2% (20 MG/ML) 5 ML SYRINGE
INTRAMUSCULAR | Status: DC | PRN
  Administered 2018-11-06: 100 mg via INTRAVENOUS

## 2018-11-06 MED ORDER — BUPIVACAINE-EPINEPHRINE 0.25% -1:200000 IJ SOLN
INTRAMUSCULAR | Status: DC | PRN
  Administered 2018-11-06: 30 mL

## 2018-11-06 MED ORDER — PHENYLEPHRINE 40 MCG/ML (10ML) SYRINGE FOR IV PUSH (FOR BLOOD PRESSURE SUPPORT)
PREFILLED_SYRINGE | INTRAVENOUS | Status: AC
  Filled 2018-11-06: qty 20

## 2018-11-06 MED ORDER — ROCURONIUM BROMIDE 10 MG/ML (PF) SYRINGE
PREFILLED_SYRINGE | INTRAVENOUS | Status: DC | PRN
  Administered 2018-11-06: 10 mg via INTRAVENOUS
  Administered 2018-11-06: 50 mg via INTRAVENOUS
  Administered 2018-11-06: 20 mg via INTRAVENOUS
  Administered 2018-11-06: 10 mg via INTRAVENOUS

## 2018-11-06 MED ORDER — PROPOFOL 10 MG/ML IV BOLUS
INTRAVENOUS | Status: AC
  Filled 2018-11-06: qty 40

## 2018-11-06 MED ORDER — BUPIVACAINE-EPINEPHRINE (PF) 0.25% -1:200000 IJ SOLN
INTRAMUSCULAR | Status: AC
  Filled 2018-11-06: qty 30

## 2018-11-06 MED ORDER — PHENYLEPHRINE HCL (PRESSORS) 10 MG/ML IV SOLN
INTRAVENOUS | Status: AC
  Filled 2018-11-06: qty 1

## 2018-11-06 MED ORDER — ONDANSETRON HCL 4 MG/2ML IJ SOLN
INTRAMUSCULAR | Status: AC
  Filled 2018-11-06: qty 2

## 2018-11-06 MED ORDER — MIDAZOLAM HCL 5 MG/5ML IJ SOLN
INTRAMUSCULAR | Status: DC | PRN
  Administered 2018-11-06: 2 mg via INTRAVENOUS

## 2018-11-06 MED ORDER — FENTANYL CITRATE (PF) 250 MCG/5ML IJ SOLN
INTRAMUSCULAR | Status: AC
  Filled 2018-11-06: qty 5

## 2018-11-06 MED ORDER — DEXAMETHASONE SODIUM PHOSPHATE 10 MG/ML IJ SOLN
INTRAMUSCULAR | Status: AC
  Filled 2018-11-06: qty 1

## 2018-11-06 MED ORDER — SUCCINYLCHOLINE CHLORIDE 200 MG/10ML IV SOSY
PREFILLED_SYRINGE | INTRAVENOUS | Status: DC | PRN
  Administered 2018-11-06: 100 mg via INTRAVENOUS

## 2018-11-06 MED ORDER — OXYCODONE HCL 5 MG PO TABS: 5.0000 mg | ORAL_TABLET | Freq: Once | ORAL | Status: DC | PRN

## 2018-11-06 MED ORDER — TRAMADOL HCL 50 MG PO TABS
50.0000 mg | ORAL_TABLET | Freq: Four times a day (QID) | ORAL | Status: DC | PRN
  Administered 2018-11-06: 19:00:00 50 mg via ORAL
  Filled 2018-11-06: qty 1

## 2018-11-06 MED ORDER — LIDOCAINE 2% (20 MG/ML) 5 ML SYRINGE
INTRAMUSCULAR | Status: AC
  Filled 2018-11-06: qty 5

## 2018-11-06 MED ORDER — HYDROCODONE-ACETAMINOPHEN 5-325 MG PO TABS: 1.0000 | ORAL_TABLET | ORAL | Status: DC | PRN

## 2018-11-06 MED ORDER — KETAMINE HCL 10 MG/ML IJ SOLN
INTRAMUSCULAR | Status: AC
  Filled 2018-11-06: qty 1

## 2018-11-06 MED ORDER — EVICEL 5 ML EX KIT
PACK | Freq: Once | CUTANEOUS | Status: AC
  Administered 2018-11-06: 12:00:00 5 mL
  Filled 2018-11-06: qty 1

## 2018-11-06 MED ORDER — LACTATED RINGERS IV SOLN
INTRAVENOUS | Status: DC
  Administered 2018-11-06 (×2): via INTRAVENOUS

## 2018-11-06 MED ORDER — CEFAZOLIN SODIUM-DEXTROSE 2-4 GM/100ML-% IV SOLN
2.0000 g | INTRAVENOUS | Status: AC
  Administered 2018-11-06: 2 g via INTRAVENOUS
  Filled 2018-11-06: qty 100

## 2018-11-06 MED ORDER — GABAPENTIN 300 MG PO CAPS
300.0000 mg | ORAL_CAPSULE | ORAL | Status: DC
  Filled 2018-11-06: qty 1

## 2018-11-06 MED ORDER — LIDOCAINE 20MG/ML (2%) 15 ML SYRINGE OPTIME
INTRAMUSCULAR | Status: DC | PRN
  Administered 2018-11-06: 1.5 mg/kg/h via INTRAVENOUS

## 2018-11-06 SURGICAL SUPPLY — 26 items
BIOPATCH WHT 1IN DISK W/4.0 H (GAUZE/BANDAGES/DRESSINGS) ×1 IMPLANT
COVER WAND RF STERILE (DRAPES) IMPLANT
DERMABOND ADVANCED (GAUZE/BANDAGES/DRESSINGS)
DERMABOND ADVANCED .7 DNX12 (GAUZE/BANDAGES/DRESSINGS) IMPLANT
DRAPE LAPAROTOMY TRNSV 102X78 (DRAPE) ×2 IMPLANT
DRSG PAD ABDOMINAL 8X10 ST (GAUZE/BANDAGES/DRESSINGS) ×1 IMPLANT
DRSG TEGADERM 4X4.75 (GAUZE/BANDAGES/DRESSINGS) ×1 IMPLANT
GAUZE 4X4 16PLY RFD (DISPOSABLE) ×2 IMPLANT
GAUZE SPONGE 4X4 12PLY STRL (GAUZE/BANDAGES/DRESSINGS) ×2 IMPLANT
GLOVE BIOGEL PI IND STRL 7.0 (GLOVE) ×1 IMPLANT
GLOVE BIOGEL PI INDICATOR 7.0 (GLOVE) ×1
GLOVE ECLIPSE 8.0 STRL XLNG CF (GLOVE) ×2 IMPLANT
GLOVE INDICATOR 8.0 STRL GRN (GLOVE) ×2 IMPLANT
GOWN SPEC L4 XLG W/TWL (GOWN DISPOSABLE) ×2 IMPLANT
GOWN STRL REUS W/ TWL XL LVL3 (GOWN DISPOSABLE) ×3 IMPLANT
GOWN STRL REUS W/TWL LRG LVL3 (GOWN DISPOSABLE) ×2 IMPLANT
GOWN STRL REUS W/TWL XL LVL3 (GOWN DISPOSABLE) ×3
KIT BASIN OR (CUSTOM PROCEDURE TRAY) ×2 IMPLANT
KIT TURNOVER KIT A (KITS) IMPLANT
NEEDLE HYPO 22GX1.5 SAFETY (NEEDLE) ×2 IMPLANT
PACK GENERAL/GYN (CUSTOM PROCEDURE TRAY) ×2 IMPLANT
SUT ETHILON 2 0 PS N (SUTURE) ×2 IMPLANT
SUT VIC AB 2-0 SH 18 (SUTURE) ×4 IMPLANT
SUT VIC AB 3-0 SH 18 (SUTURE) ×1 IMPLANT
SYR CONTROL 10ML LL (SYRINGE) ×2 IMPLANT
TOWEL OR 17X26 10 PK STRL BLUE (TOWEL DISPOSABLE) ×2 IMPLANT

## 2018-11-06 NOTE — Discharge Instructions (Signed)
[facility] - DISCHARGE INSTRUCTIONS TO PATIENT  Activity:  Driving - wait until you are seen in the office next week to drive.   May walk.  But when resting, elevate right leg.  Do not sit with legs down for now.  Wound Care:   Leave the incision dry for 2 days, then remove bandages and you may shower.  It the wound is doing well, may leave open or put a light dressing on it.        Empty drain twice a day and record the drainage amount.  Diet:  As tolerated  Follow up appointment:  Call Dr. Pollie Friar office [facility]) at [phone] for an appointment in next week.  Medications and dosages:  Resume your home medications.  You have a prescription for:  Ultram  Call Dr. Lucia Gaskins or his office  [phone]) if you have:  Temperature greater than 100.4,  Persistent nausea and vomiting,  Severe uncontrolled pain,  Redness, tenderness, or signs of infection (pain, swelling, redness, odor or green/yellow discharge around the site),  Difficulty breathing, headache or visual disturbances,  Any other questions or concerns you may have after discharge.  In an emergency, call 911 or go to an Emergency Department at a nearby hospital.

## 2018-11-06 NOTE — Anesthesia Procedure Notes (Signed)
Procedure Name: Intubation Date/Time: 11/06/2018 10:33 AM Performed by: Silas Sacramento, CRNA Pre-anesthesia Checklist: Patient identified, Emergency Drugs available, Suction available and Patient being monitored Patient Re-evaluated:Patient Re-evaluated prior to induction Oxygen Delivery Method: Circle system utilized Preoxygenation: Pre-oxygenation with 100% oxygen Induction Type: IV induction, Rapid sequence and Cricoid Pressure applied Laryngoscope Size: Mac and 4 Grade View: Grade I Tube type: Oral Tube size: 7.5 mm Number of attempts: 1 Airway Equipment and Method: Stylet Placement Confirmation: ETT inserted through vocal cords under direct vision,  positive ETCO2 and breath sounds checked- equal and bilateral Secured at: 23 cm Tube secured with: Tape Dental Injury: Teeth and Oropharynx as per pre-operative assessment

## 2018-11-06 NOTE — Discharge Summary (Signed)
Physician Discharge Summary  Patient ID:  James Byrd  MRN: [id number]  DOB/AGE: 08-25-1965 53 y.o.  Admit date: 11/06/2018 Discharge date: 11/06/2018  Discharge Diagnoses:  1.  Right INGUINAL ADENOPATHY (R59.0) - metastatic carcinoma on frozen section.  Final pathology is pending.  2.  MALIGNANT MELANOMA OF TOE OF RIGHT FOOT (C43.71)  Stage IIIB ([id number]) subungual melanoma of the right hallux and microscopic right inguinal lymph node. Original surgery 10/13/2014. 3.  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 Darene Byrd. Early. No further intervention. 4. Peripheral neuropathy       Dr. Narda Byrd - 12/08/2015 5. GERD 6. Depression 7. SVT       History of prior ablation (? cardiologist)   Principal Problem:   Malignant melanoma of right great toe Stage IIIB ([id number])   Operation: Procedure(s): RIGHT INGUINAL LYMPH NODE BIOPSY AND POSSIBLE RIGHT Inguinal NODE DISECTION on 11/06/2018 - D. [facility] [place]  Discharged Condition: good  Hospital Course: James Byrd is an 53 y.o. male whose primary care physician is Byrd, James Mt, MD and who was admitted 11/06/2018 with a chief complaint of right inguinal adenopathy.   He has a history of a melanoma of his right great toe.     He was brought to the operating room on 11/06/2018 and underwent RIGHT INGUINAL LYMPH NODE BIOPSY AND POSSIBLE RIGHT Inguinal NODE DISECTION.    It is now the evening of surgery.  He is doing well and wants to go home.   I think that he will be fine going home.  I talked to his wife on the phone and went over discharge instructions.   The discharge instructions were reviewed with the patient.  Consults: None  Significant Diagnostic Studies: Results for orders placed or performed during the hospital encounter of 17/61/60  Basic metabolic panel  Result Value Ref Range   Sodium 140 135 - 145 mmol/L   Potassium 4.1 3.5 - 5.1 mmol/L   Chloride 107 98 - 111 mmol/L   CO2 25 22 - 32  mmol/L   Glucose, Bld 87 70 - 99 mg/dL   BUN 16 6 - 20 mg/dL   Creatinine, Ser 0.95 0.61 - 1.24 mg/dL   Calcium 9.2 8.9 - 10.3 mg/dL   GFR calc non Af Amer >60 >60 mL/min   GFR calc Af Amer >60 >60 mL/min   Anion gap 8 5 - 15  CBC  Result Value Ref Range   WBC 4.2 4.0 - 10.5 K/uL   RBC 4.91 4.22 - 5.81 MIL/uL   Hemoglobin 14.4 13.0 - 17.0 g/dL   HCT 44.6 39.0 - 52.0 %   MCV 90.8 80.0 - 100.0 fL   MCH 29.3 26.0 - 34.0 pg   MCHC 32.3 30.0 - 36.0 g/dL   RDW 12.3 11.5 - 15.5 %   Platelets 184 150 - 400 K/uL   nRBC 0.0 0.0 - 0.2 %    Nm Pet Image Initial (pi) Whole Body  Result Date: 10/30/2018 CLINICAL DATA:  Subsequent treatment strategy for malignant melanoma. EXAM: NUCLEAR MEDICINE PET WHOLE BODY TECHNIQUE: 12.7 mCi F-18 FDG was injected intravenously. Full-ring PET imaging was performed from the skull base to thigh after the radiotracer. CT data was obtained and used for attenuation correction and anatomic localization. Fasting blood glucose: 89 mg/dl COMPARISON:  Multiple exams, including 06/04/2018 FINDINGS: Mediastinal blood pool activity: SUV max 2.7 HEAD/NECK: Symmetric accentuated tonsillar activity, right palatine tonsil maximum  SUV 9.2, left 9.4. Physiologic glottic activity. Incidental CT findings: Mild chronic left maxillary sinusitis. CHEST: No significant abnormal hypermetabolic activity in this region. Incidental CT findings: Atherosclerotic calcification of the aortic arch. Scarring or atelectasis in both lower lobes. ABDOMEN/PELVIS: Partially necrotic right inguinal lymph node measures 3.9 cm in short axis, maximum SUV 23.1. An adjacent inguinal lymph node measures 1.4 cm in short axis on image 309/4 with maximum SUV 5.4. Accentuated activity in the proximal stomach, maximum SUV 8.1, no CT correlate, probably physiologic. Incidental CT findings: Photopenic cyst anteriorly in segment 4 liver. Cholecystectomy. Photopenic cyst in the right hepatic lobe. SKELETON: No significant  abnormal hypermetabolic activity in this region. Incidental CT findings: There are areas of chronic avascular necrosis along both knees, and potentially in the right distal tibia. Prior amputation of the right great toe at the interphalangeal joint level. EXTREMITIES: No significant abnormal hypermetabolic activity in this region. Incidental CT findings: none IMPRESSION: 1. New prominently enlarged (3.9 cm in short axis) hypermetabolic right inguinal lymph node, maximum SUV 23.1, compatible with malignancy. Small adjacent right inguinal lymph node 1.4 cm in short axis maximum SUV 5.4. 2. No other sites of active malignancy identified. Accentuated bilateral tonsillar activity is probably physiologic and similarly, accentuated proximal gastric activity has no CT correlate and is probably physiologic. 3. Other imaging findings of potential clinical significance: Aortic Atherosclerosis (ICD10-I70.0). Mild chronic left maxillary sinusitis. Hepatic cysts. Chronic avascular necrosis in both knees. Electronically Signed   By: James Byrd M.D.   On: 10/30/2018 12:11    Discharge Exam:  Vitals:   11/06/18 1630 11/06/18 1727  BP: (!) 78/59 100/75  Pulse: 60 74  Resp: 16 16  Temp: 97.8 F (36.6 C) 97.8 F (36.6 C)  SpO2: 90% 93%    General: [name] who is alert and generally healthy appearing.  Lungs: Clear to auscultation and symmetric breath sounds.  Extremities:  Right groin bandage is clean.  There is not much JP drainage at this time.  There was 80 cc recorded earlier.  Discharge Medications:   Allergies as of 11/06/2018   No Known Allergies     Medication List    TAKE these medications   aspirin-sod bicarb-citric acid 325 MG Tbef tablet Commonly known as:  ALKA-SELTZER Take 650 mg by mouth daily as needed (indigestion).   augmented betamethasone dipropionate 0.05 % cream Commonly known as:  DIPROLENE-AF Apply 1 application topically 2 (two) times daily as needed (psoriasis).    CALCIUM 600 + D PO Take 1 tablet by mouth at bedtime.   fludrocortisone 0.1 MG tablet Commonly known as:  FLORINEF Take 0.1 mg by mouth daily.   gabapentin 600 MG tablet Commonly known as:  NEURONTIN Take 600 mg by mouth 2 (two) times daily.   gabapentin 300 MG capsule Commonly known as:  NEURONTIN TAKE 2 CAPSULES 3 TIMES DAILY   hydrocortisone 5 MG tablet Commonly known as:  CORTEF Take 5 mg by mouth 2 (two) times daily.   multivitamin tablet Take 1 tablet by mouth daily.   SUPER B COMPLEX/C PO Take 1 tablet by mouth daily.   tadalafil 10 MG tablet Commonly known as:  Cialis Take 1 tablet 30 min prior to sexual activity   testosterone 50 MG/5GM (1%) Gel Commonly known as:  ANDROGEL Place 5 g onto the skin daily.   traMADol 50 MG tablet Commonly known as:  ULTRAM Take 1-2 tablets (50-100 mg total) by mouth every 6 (six) hours as needed.  VISINE OP Place 1 drop into both eyes daily as needed (irritation).   vitamin C 1000 MG tablet Take 1,000 mg by mouth daily.   Vitamin D 50 MCG (2000 UT) tablet Take 2,000 Units by mouth daily.       Disposition: Discharge disposition: 01-Home or Self Care       Discharge Instructions    Diet - low sodium heart healthy   Complete by:  As directed    Increase activity slowly   Complete by:  As directed          Signed: Alphonsa Overall, M.D., [facility] Office:  [phone]  11/06/2018, 6:13 PM

## 2018-11-06 NOTE — Op Note (Addendum)
11/06/2018  12:54 PM  PATIENT:  James Byrd, 53 y.o., male, MRN: [id number]  PREOP DIAGNOSIS:  Right inguinal adenopathy  POSTOP DIAGNOSIS:   Malignant right inguinal node, probable melanoma  PROCEDURE:   Procedure(s):  RIGHT INGUINAL LYMPH NODE BIOPSY AND RIGHT Inguinal NODE DISECTION  SURGEON:   Alphonsa Overall, M.D.  ASSISTANT:   Jonny Ruiz, M.D.  ANESTHESIA:   general  Anesthesiologist: Myrtie Soman, MD CRNA: Glory Buff, CRNA; Teheng, Ryan E, CRNA  General  EBL:  75  ml  BLOOD ADMINISTERED: none  DRAINS: none   LOCAL MEDICATIONS USED:   30 cc of 1/4% marcaine  SPECIMEN:   Right inguinal mass - for frozen, remaining of right inguinal node dissection  COUNTS CORRECT:  YES  INDICATIONS FOR PROCEDURE:  James Byrd is a 53 y.o. (DOB: 1966/04/23) white male whose primary care physician is Street, Sharon Mt, MD and comes for right inguinal biopsy and probable right inguinal node dissection.  He had a melanoma of the right right toe and underwent a amputataion of his right great toe and right inguinal SLNBx on 10/13/2014.  He had stage IIIB ([id number]) right great toe subungual melanoma.   He has been followed by Dr. Marin Olp.  He noticed a right groin mass around the middle of April, 2020.  He had a PET scan on 10/30/2018 which showed only hypermetabolic activity in his right groin.  He comes for right inguinal biopsy and probable right inguinal node dissection.   The indications and risks of the surgery were explained to the patient.  The risks include, but are not limited to, infection, bleeding, and nerve injury.  PROCEDURE:             A timeout was held and surgical checklist run.              Since I did not have any pathology on the right inguinal node, I first did an open excisional biopsy of the one large (4.5 cm) node in his right inguinal area.  I based my skin incision somewhat medially, to be over this large node.  This node was just below the inguinal  ligament, anterior and medial to the femoral vein (node of [place]?).  In removing this node, I had cleaned out most of the tissue from the inguinal ligament to 5 cm below the inguinal ligament.  I sent the node for frozen section.  Dr. Margarito Courser called back on the frozen section to say the lymph node had malignant cells.                I then proceeded to a right superficial femoral inguinal lymph node dissection.  I extended my incision toward to the right anterior iliac spine and down below the incision.  I identified the adductor longus and sartorius muscles and swept the fat tissue and included lymph nodes cranially.  I spared the greater saphenous vein during the dissection.  I took the dissection to the inguinal ligament and went anterior to the abdominal wall about 4-5 cm above the inguinal ligament sweeping out fat content and any obvious node that I can see.              His right inguinal cord structure was skeletonized below the inguinal ligament, medially in the incision.  I could not tell if he had a inguinal hernia.                I had maintained a fair amount of  fat under the skin.  It looked like I could close the wound without creating a sartorius flap.  I brought out a single 45 French drain inferiorly and laterally to the incision.  A 19 French drain was sewn in place with a 2-0 nylon suture.  I irrigated the wound with 1500 cc of saline.  I placed 10 cc of EvaSeal in the wound.  I infiltrated the skin with 30 cc of quarter percent Marcaine.  .              I then closed the skin in layers.  The deep subcutaneous fat was closed with interrupted 2-0 Vicryl sutures.  The superficial subcutaneous fat was closed with interrupted 3-0 Vicryl sutures.  The skin was closed with interrupted 2-0 nylon sutures.  A sterile pressure dressing was placed over the inguinal dissection and the patient will be kept overnight for observation.              I have a surgeon as a first assist to retract,  expose, and assist on this difficult operation.   The patient was transferred to the recovery room in good condition.  Alphonsa Overall, MD, [facility] Pager: [phone] Office phone:  [phone]

## 2018-11-06 NOTE — Interval H&P Note (Signed)
History and Physical Interval Note:  11/06/2018 9:41 AM  James Byrd  has presented today for surgery, with the diagnosis of Right inguinal adenopathy.  The various methods of treatment have been discussed with the patient and family.  Patient ready for surgery.  After consideration of risks, benefits and other options for treatment, the patient has consented to  Procedure(s): RIGHT INGUINAL LYMPH NODE BIOPSY AND POSSIBLE RIGHT GROIN NODE DISECTION (Right) as a surgical intervention.  The patient's history has been reviewed, patient examined, no change in status, stable for surgery.  I have reviewed the patient's chart and labs.  Questions were answered to the patient's satisfaction.     Shann Medal

## 2018-11-06 NOTE — Progress Notes (Signed)
Discharge instructions given to pt and all questions were answered.  

## 2018-11-06 NOTE — Transfer of Care (Signed)
Immediate Anesthesia Transfer of Care Note  Patient: James Byrd  Procedure(s) Performed: RIGHT INGUINAL LYMPH NODE BIOPSY AND POSSIBLE RIGHT GROIN NODE DISECTION (Right Inguinal)  Patient Location: PACU  Anesthesia Type:General  Level of Consciousness: drowsy, patient cooperative and responds to stimulation  Airway & Oxygen Therapy: Patient Spontanous Breathing and Patient connected to face mask oxygen  Post-op Assessment: Report given to RN and Post -op Vital signs reviewed and stable  Post vital signs: Reviewed and stable  Last Vitals:  Vitals Value Taken Time  BP 110/67 11/06/2018  1:07 PM  Temp 36.6 C 11/06/2018  1:07 PM  Pulse 70 11/06/2018  1:11 PM  Resp 10 11/06/2018  1:11 PM  SpO2 94 % 11/06/2018  1:11 PM  Vitals shown include unvalidated device data.  Last Pain:  Vitals:   11/06/18 1307  TempSrc:   PainSc: Asleep         Complications: No apparent anesthesia complications

## 2018-11-07 ENCOUNTER — Encounter (HOSPITAL_COMMUNITY): Payer: Self-pay | Admitting: Surgery

## 2018-11-08 NOTE — Anesthesia Postprocedure Evaluation (Signed)
Anesthesia Post Note  Patient: James Byrd  Procedure(s) Performed: RIGHT INGUINAL LYMPH NODE BIOPSY AND POSSIBLE RIGHT GROIN NODE DISECTION (Right Inguinal)     Patient location during evaluation: PACU Anesthesia Type: General Level of consciousness: awake and alert Pain management: pain level controlled Vital Signs Assessment: post-procedure vital signs reviewed and stable Respiratory status: spontaneous breathing, nonlabored ventilation, respiratory function stable and patient connected to nasal cannula oxygen Cardiovascular status: blood pressure returned to baseline and stable Postop Assessment: no apparent nausea or vomiting Anesthetic complications: no    Last Vitals:  Vitals:   11/06/18 1630 11/06/18 1727  BP: (!) 78/59 100/75  Pulse: 60 74  Resp: 16 16  Temp: 36.6 C 36.6 C  SpO2: 90% 93%    Last Pain:  Vitals:   11/06/18 1836  TempSrc:   PainSc: 5                  Dyshawn Cangelosi S

## 2018-11-09 DIAGNOSIS — E669 Obesity, unspecified: Secondary | ICD-10-CM | POA: Diagnosis not present

## 2018-11-09 DIAGNOSIS — Z85820 Personal history of malignant melanoma of skin: Secondary | ICD-10-CM | POA: Diagnosis not present

## 2018-11-09 DIAGNOSIS — E271 Primary adrenocortical insufficiency: Secondary | ICD-10-CM | POA: Diagnosis not present

## 2018-11-09 DIAGNOSIS — Z6832 Body mass index (BMI) 32.0-32.9, adult: Secondary | ICD-10-CM | POA: Diagnosis not present

## 2018-11-09 DIAGNOSIS — I9589 Other hypotension: Secondary | ICD-10-CM | POA: Diagnosis not present

## 2018-11-09 DIAGNOSIS — C774 Secondary and unspecified malignant neoplasm of inguinal and lower limb lymph nodes: Secondary | ICD-10-CM | POA: Diagnosis not present

## 2018-11-22 ENCOUNTER — Other Ambulatory Visit: Payer: Self-pay | Admitting: Hematology & Oncology

## 2018-11-22 DIAGNOSIS — C4371 Malignant melanoma of right lower limb, including hip: Secondary | ICD-10-CM

## 2018-11-23 ENCOUNTER — Encounter (HOSPITAL_COMMUNITY): Payer: Self-pay | Admitting: Hematology & Oncology

## 2018-11-26 ENCOUNTER — Telehealth: Payer: Self-pay | Admitting: Hematology & Oncology

## 2018-11-26 NOTE — Telephone Encounter (Signed)
Faxed medical records to: [facility] F: [phone] P: [phone]   for   James Byrd 12-23-1965 CLAIM: [id number] YEAR 2019    COPY SCANNED

## 2018-12-05 ENCOUNTER — Telehealth: Payer: Self-pay | Admitting: Hematology & Oncology

## 2018-12-05 DIAGNOSIS — C774 Secondary and unspecified malignant neoplasm of inguinal and lower limb lymph nodes: Secondary | ICD-10-CM | POA: Diagnosis not present

## 2018-12-05 DIAGNOSIS — C4371 Malignant melanoma of right lower limb, including hip: Secondary | ICD-10-CM | POA: Diagnosis not present

## 2018-12-05 NOTE — Telephone Encounter (Signed)
Called and spoke with patient regarding appointments added per 6/17 staff mesasge

## 2018-12-07 DIAGNOSIS — Z20828 Contact with and (suspected) exposure to other viral communicable diseases: Secondary | ICD-10-CM | POA: Diagnosis not present

## 2018-12-07 DIAGNOSIS — R509 Fever, unspecified: Secondary | ICD-10-CM | POA: Diagnosis not present

## 2018-12-07 DIAGNOSIS — Z03818 Encounter for observation for suspected exposure to other biological agents ruled out: Secondary | ICD-10-CM | POA: Diagnosis not present

## 2018-12-12 ENCOUNTER — Other Ambulatory Visit: Payer: Self-pay | Admitting: *Deleted

## 2018-12-12 DIAGNOSIS — D696 Thrombocytopenia, unspecified: Secondary | ICD-10-CM

## 2018-12-12 DIAGNOSIS — C4371 Malignant melanoma of right lower limb, including hip: Secondary | ICD-10-CM

## 2018-12-13 ENCOUNTER — Encounter: Payer: Self-pay | Admitting: Hematology & Oncology

## 2018-12-13 ENCOUNTER — Inpatient Hospital Stay (HOSPITAL_BASED_OUTPATIENT_CLINIC_OR_DEPARTMENT_OTHER): Payer: PPO | Admitting: Hematology & Oncology

## 2018-12-13 ENCOUNTER — Inpatient Hospital Stay: Payer: PPO | Attending: Hematology & Oncology

## 2018-12-13 ENCOUNTER — Other Ambulatory Visit: Payer: Self-pay

## 2018-12-13 VITALS — BP 116/75 | HR 77 | Temp 98.4°F | Resp 18 | Wt 237.0 lb

## 2018-12-13 DIAGNOSIS — Z79899 Other long term (current) drug therapy: Secondary | ICD-10-CM | POA: Diagnosis not present

## 2018-12-13 DIAGNOSIS — C774 Secondary and unspecified malignant neoplasm of inguinal and lower limb lymph nodes: Secondary | ICD-10-CM | POA: Diagnosis not present

## 2018-12-13 DIAGNOSIS — C4921 Malignant neoplasm of connective and soft tissue of right lower limb, including hip: Secondary | ICD-10-CM | POA: Insufficient documentation

## 2018-12-13 DIAGNOSIS — C4371 Malignant melanoma of right lower limb, including hip: Secondary | ICD-10-CM

## 2018-12-13 DIAGNOSIS — Z7189 Other specified counseling: Secondary | ICD-10-CM

## 2018-12-13 DIAGNOSIS — D696 Thrombocytopenia, unspecified: Secondary | ICD-10-CM

## 2018-12-13 LAB — CBC WITH DIFFERENTIAL (CANCER CENTER ONLY)
Abs Immature Granulocytes: 0.01 10*3/uL (ref 0.00–0.07)
Basophils Absolute: 0 10*3/uL (ref 0.0–0.1)
Basophils Relative: 1 %
Eosinophils Absolute: 0.3 10*3/uL (ref 0.0–0.5)
Eosinophils Relative: 7 %
HCT: 44 % (ref 39.0–52.0)
Hemoglobin: 14.5 g/dL (ref 13.0–17.0)
Immature Granulocytes: 0 %
Lymphocytes Relative: 33 %
Lymphs Abs: 1.4 10*3/uL (ref 0.7–4.0)
MCH: 29.8 pg (ref 26.0–34.0)
MCHC: 33 g/dL (ref 30.0–36.0)
MCV: 90.3 fL (ref 80.0–100.0)
Monocytes Absolute: 0.2 10*3/uL (ref 0.1–1.0)
Monocytes Relative: 4 %
Neutro Abs: 2.3 10*3/uL (ref 1.7–7.7)
Neutrophils Relative %: 55 %
Platelet Count: 214 10*3/uL (ref 150–400)
RBC: 4.87 MIL/uL (ref 4.22–5.81)
RDW: 12.5 % (ref 11.5–15.5)
WBC Count: 4.2 10*3/uL (ref 4.0–10.5)
nRBC: 0 % (ref 0.0–0.2)

## 2018-12-13 LAB — CMP (CANCER CENTER ONLY)
ALT: 16 U/L (ref 0–44)
AST: 16 U/L (ref 15–41)
Albumin: 4.4 g/dL (ref 3.5–5.0)
Alkaline Phosphatase: 63 U/L (ref 38–126)
Anion gap: 10 (ref 5–15)
BUN: 15 mg/dL (ref 6–20)
CO2: 28 mmol/L (ref 22–32)
Calcium: 9.6 mg/dL (ref 8.9–10.3)
Chloride: 105 mmol/L (ref 98–111)
Creatinine: 1.07 mg/dL (ref 0.61–1.24)
GFR, Est AFR Am: >60 mL/min (ref >60)
GFR, Estimated: >60 mL/min (ref >60)
Glucose, Bld: 111 mg/dL — ABNORMAL HIGH (ref 70–99)
Potassium: 4.1 mmol/L (ref 3.5–5.1)
Sodium: 143 mmol/L (ref 135–145)
Total Bilirubin: 0.4 mg/dL (ref 0.3–1.2)
Total Protein: 7 g/dL (ref 6.5–8.1)

## 2018-12-13 LAB — TSH: TSH: 2.384 u[IU]/mL (ref 0.320–4.118)

## 2018-12-17 ENCOUNTER — Encounter: Payer: Self-pay | Admitting: Hematology & Oncology

## 2018-12-17 DIAGNOSIS — Z7189 Other specified counseling: Secondary | ICD-10-CM

## 2018-12-17 HISTORY — DX: Other specified counseling: Z71.89

## 2018-12-17 NOTE — Progress Notes (Signed)
START ON PATHWAY REGIMEN - Melanoma and Other Skin Cancers     A cycle is every 28 days:     Nivolumab   **Always confirm dose/schedule in your pharmacy ordering system**  Patient Characteristics: Melanoma, Regional (Nodal) Recurrence / In Transit Disease, Resected, BRAF V600 Wild Type / BRAF V600 Results Pending or Unknown Disease Classification: Melanoma Disease Subtype: Cutaneous Therapeutic Status: Regional (Nodal) Recurrence / In Transit Disease BRAF V600 Mutation Status: BRAF V600 Wild Type (No Mutation) Intent of Therapy: Curative Intent, Discussed with Patient

## 2018-12-17 NOTE — Progress Notes (Signed)
Histology and Location of Primary Cancer: Principle Diagnosis:  Stage IIIB ([id number]) subungual melanoma of the right hallux -- nodal recurrence in the RIGHT inguinal nodes -- BRAF wt  Location(s) of Symptomatic tumor(s): PET 10/30/18: IMPRESSION: 1. New prominently enlarged (3.9 cm in short axis) hypermetabolic right inguinal lymph node, maximum SUV 23.1, compatible with malignancy. Small adjacent right inguinal lymph node 1.4 cm in short axis maximum SUV 5.4.  Surgery 11/06/18:   PROCEDURE:   Procedure(s):  RIGHT INGUINAL LYMPH NODE BIOPSY AND RIGHT Inguinal NODE DISECTION  SURGEON:   Alphonsa Overall, M.D.  Past/Anticipated chemotherapy by medical oncology, if any: Per Dr. Marin Olp 12/13/18:  Interim History:  Mr. James Byrd is here today for follow-up.  We found that he did have a recurrence in the right inguinal region.  He did have a PET scan that was done which did not show any evidence of metastatic disease.  He underwent right inguinal node resection on 11/06/2018.  The pathology report ([id number]) showed 2 of 7 lymph nodes with melanoma.  Large inguinal lymph node measured 5.6 x 4.5 x 4.4 cm.  I really think that he needs radiation therapy to the right inguinal nodal basin.  I suspect that he has residual microscopic disease in that area.  I spoke with Dr. Sondra Come of Radiation Oncology today.  He will see Mr. James Byrd.  Given that his BRAF analysis was negative for a mutation, I think that we need to treat him systemically with immunotherapy to help prevent metastatic recurrence.  I do think that he should be able to handle the nivolumab much, much better than he did the [facility].  It is been 4 years since he had [place].  He had an incredibly horrible time with this and still is having residual neurological problems.  I think that with nivolumab, we can decrease significantly his risk of metastatic recurrence.  Patient's main complaints related to symptomatic tumor(s) are: There is no palpable  abdominal mass.  There is no palpable liver or spleen tip.  He has a healing right inguinal lymphadenectomy scar.  I cannot palpate any obvious adenopathy.  The left inguinal area is unremarkable.     Pain on a scale of 0-10 is: Pt denies c/o pain. Pt reports "a little knee pain but its not from the cancer".    Ambulatory status? Walker? Wheelchair?: Pt with steady gait, no assistive device.  SAFETY ISSUES:  Prior radiation? No  Pacemaker/ICD? No  Possible current pregnancy? N/A  Is the patient on methotrexate? No  Additional Complaints / other details:  Pt presents today for initial consult with Dr. Sondra Come for Radiation Oncology. Pt is unaccompanied today and is frustrated with visitor restrictions due to Covid 19. Pt to video chat with wife during visit with Dr. Sondra Come.   BP 116/82 (BP Location: Right Arm, Patient Position: Sitting)   Pulse 70   Temp 97.8 F (36.6 C) (Oral)   Resp 20   Ht 6' (1.829 m)   Wt 240 lb 3.2 oz (109 kg)   SpO2 97%   BMI 32.58 kg/m   Wt Readings from Last 3 Encounters:  12/19/18 240 lb 3.2 oz (109 kg)  12/13/18 237 lb (107.5 kg)  11/06/18 226 lb (102.5 kg)   Loma Sousa, RN BSN

## 2018-12-17 NOTE — Progress Notes (Signed)
Hematology and Oncology Follow Up Visit  James Byrd [id number] [birth year] 53 y.o. 12/17/2018   Principle Diagnosis:  Stage IIIB ([id number]) subungual melanoma of the right hallux -- nodal recurrence in the RIGHT inguinal nodes -- BRAF wt  Current Therapy:   Yervoy-status post 3 cycles - discontinued in September 2016 secondary to toxicity  XRT to the RIGHT inguinal basin Nivolumab 400 mg q month -- start in 12/2018   Interim History:  James Byrd is here today for follow-up.  We found that he did have a recurrence in the right inguinal region.  He did have a PET scan that was done which did not show any evidence of metastatic disease.  He underwent right inguinal node resection on 11/06/2018.  The pathology report ([id number]) showed 2 of 7 lymph nodes with melanoma.  Large inguinal lymph node measured 5.6 x 4.5 x 4.4 cm.  I really think that he needs radiation therapy to the right inguinal nodal basin.  I suspect that he has residual microscopic disease in that area.  I spoke with Dr. Sondra Come of Radiation Oncology today.  He will see James Byrd.  Given that his BRAF analysis was negative for a mutation, I think that we need to treat him systemically with immunotherapy to help prevent metastatic recurrence.  I do think that he should be able to handle the nivolumab much, much better than he did the [facility].  It is been 4 years since he had [place].  He had an incredibly horrible time with this and still is having residual neurological problems.  I think that with nivolumab, we can decrease significantly his risk of metastatic recurrence.  He had his wife on the cell phone.  She and he both are somewhat irritated that she cannot be with him in the office today.  I tried to explain to them that because of the coronavirus, hospital policy is that only patients can be seen in the office.  However, if a patient has a significant physical or mental disability, then a family member can be present.   I know this is a huge inconvenience for them.  He, otherwise seems to be doing okay.  He has had no cough.  He has had no headache.  He has had no nausea or vomiting.  There is been no change in bowel or bladder habits.  Overall, his performance status is ECOG 1.   Medications:  Allergies as of 12/13/2018   No Known Allergies     Medication List       Accurate as of December 13, 2018 11:59 PM. If you have any questions, ask your nurse or doctor.        aspirin-sod bicarb-citric acid 325 MG Tbef tablet Commonly known as: ALKA-SELTZER Take 650 mg by mouth daily as needed (indigestion).   augmented betamethasone dipropionate 0.05 % cream Commonly known as: DIPROLENE-AF Apply 1 application topically 2 (two) times daily as needed (psoriasis).   CALCIUM 600 + D PO Take 1 tablet by mouth at bedtime.   diclofenac sodium 1 % Gel Commonly known as: VOLTAREN   fludrocortisone 0.1 MG tablet Commonly known as: FLORINEF Take 0.1 mg by mouth daily.   gabapentin 600 MG tablet Commonly known as: NEURONTIN Take 600 mg by mouth 2 (two) times daily.   gabapentin 300 MG capsule Commonly known as: NEURONTIN TAKE 2 CAPSULES 3 TIMES DAILY   hydrocortisone 5 MG tablet Commonly known as: CORTEF Take 5 mg by mouth 2 (two) times daily.  multivitamin tablet Take 1 tablet by mouth daily.   SUPER B COMPLEX/C PO Take 1 tablet by mouth daily.   tadalafil 10 MG tablet Commonly known as: Cialis Take 1 tablet 30 min prior to sexual activity   testosterone 50 MG/5GM (1%) Gel Commonly known as: ANDROGEL Place 5 g onto the skin daily.   Testosterone 20.25 MG/ACT (1.62%) Gel   traMADol 50 MG tablet Commonly known as: ULTRAM Take 1-2 tablets (50-100 mg total) by mouth every 6 (six) hours as needed.   VISINE OP Place 1 drop into both eyes daily as needed (irritation).   vitamin C 1000 MG tablet Take 1,000 mg by mouth daily.   Vitamin D 50 MCG (2000 UT) tablet Take 2,000 Units by mouth  daily.       Allergies: No Known Allergies  Past Medical History, Surgical history, Social history, and Family History were reviewed and updated.  Review of Systems: Review of Systems  Constitutional: Positive for malaise/fatigue.  HENT: Negative.   Eyes: Negative.   Respiratory: Positive for shortness of breath.   Cardiovascular: Negative.   Gastrointestinal: Positive for constipation and nausea.  Genitourinary: Negative.   Musculoskeletal: Positive for joint pain and myalgias.  Skin: Negative.   Neurological: Positive for tingling and focal weakness.  Endo/Heme/Allergies: Negative.   Psychiatric/Behavioral: Positive for memory loss.     Physical Exam:  weight is 237 lb (107.5 kg). His oral temperature is 98.4 F (36.9 C). His blood pressure is 116/75 and his pulse is 77. His respiration is 18 and oxygen saturation is 97%.   Wt Readings from Last 3 Encounters:  12/13/18 237 lb (107.5 kg)  11/06/18 226 lb (102.5 kg)  11/02/18 231 lb 8 oz (105 kg)    Physical Exam Vitals signs reviewed.  HENT:     Head: Normocephalic and atraumatic.  Eyes:     Pupils: Pupils are equal, round, and reactive to light.  Neck:     Musculoskeletal: Normal range of motion.  Cardiovascular:     Rate and Rhythm: Normal rate and regular rhythm.     Heart sounds: Normal heart sounds.  Pulmonary:     Effort: Pulmonary effort is normal.     Breath sounds: Normal breath sounds.  Abdominal:     General: Bowel sounds are normal.     Palpations: Abdomen is soft.     Comments: Abdominal exam shows a soft abdomen.  Bowel sounds are present.  There is no palpable abdominal mass.  There is no palpable liver or spleen tip.  He has a healing right inguinal lymphadenectomy scar.  I cannot palpate any obvious adenopathy.  The left inguinal area is unremarkable.    Musculoskeletal: Normal range of motion.        General: No tenderness or deformity.     Comments: The big toe on the right foot is  amputated.  There is no lymphedema in the right leg.  He has good pulses in his distal extremities.  Left leg is unremarkable.  Lymphadenopathy:     Cervical: No cervical adenopathy.  Skin:    General: Skin is warm and dry.     Findings: No erythema or rash.  Neurological:     Mental Status: He is alert and oriented to person, place, and time.  Psychiatric:        Behavior: Behavior normal.        Thought Content: Thought content normal.        Judgment: Judgment normal.  Lab Results  Component Value Date   WBC 4.2 12/13/2018   HGB 14.5 12/13/2018   HCT 44.0 12/13/2018   MCV 90.3 12/13/2018   PLT 214 12/13/2018   Lab Results  Component Value Date   FERRITIN 363 (H) 09/04/2015   IRON 55 09/04/2015   TIBC 200 (L) 09/04/2015   UIBC 145 09/04/2015   IRONPCTSAT 28 09/04/2015   Lab Results  Component Value Date   RBC 4.87 12/13/2018   No results found for: KPAFRELGTCHN, LAMBDASER, KAPLAMBRATIO No results found for: Kandis Cocking, IGMSERUM No results found for: Odetta Pink, SPEI   Chemistry      Component Value Date/Time   NA 143 12/13/2018 0844   NA 145 06/06/2017 1316   NA 141 10/16/2015 1334   K 4.1 12/13/2018 0844   K 4.1 06/06/2017 1316   K 3.9 10/16/2015 1334   CL 105 12/13/2018 0844   CL 108 06/06/2017 1316   CO2 28 12/13/2018 0844   CO2 26 06/06/2017 1316   CO2 21 (L) 10/16/2015 1334   BUN 15 12/13/2018 0844   BUN 19 06/06/2017 1316   BUN 20.0 10/16/2015 1334   CREATININE 1.07 12/13/2018 0844   CREATININE 1.0 06/06/2017 1316   CREATININE 1.0 10/16/2015 1334      Component Value Date/Time   CALCIUM 9.6 12/13/2018 0844   CALCIUM 9.4 06/06/2017 1316   CALCIUM 9.7 10/16/2015 1334   ALKPHOS 63 12/13/2018 0844   ALKPHOS 78 06/06/2017 1316   ALKPHOS 62 10/16/2015 1334   AST 16 12/13/2018 0844   AST 16 10/16/2015 1334   ALT 16 12/13/2018 0844   ALT 27 06/06/2017 1316   ALT 11 10/16/2015 1334    BILITOT 0.4 12/13/2018 0844   BILITOT 0.49 10/16/2015 1334      Impression and Plan: James Byrd is a very pleasant 53 yo caucasian gentleman with history of stage IIIB melanoma of the right hallux that was subungual with one microscopic positive inguinal lymph node. He completed 3 cycles of Yervoy but stopped due to side effects.   I really wish that the tumor was BRAF mutated.  If so, then we could clearly use targeted therapy.  However, I do think that he would do okay with the nivolumab.  I really do not see any problems with using nivolumab with radiation therapy.  I spent about 45 minutes with he and his wife.  We reviewed the pathology report.  I again went over my recommendations in which I really feel that radiation therapy would help.  I have to believe that he has microscopic disease in that right inguinal area.  If this is not treated locally with radiation, I think that he would have another recurrence and then run into big problems.  We will try to get him started with the nivolumab in a couple weeks.  By then, he would start his radiation therapy.  I will plan to get him back for the nivolumab treatment.  I answered all their questions.  I had a make the protocol arrangements for him.  I had to call radiation oncology.  This was a complicated visit.  [name], MD 6/29/20207:20 AM

## 2018-12-19 ENCOUNTER — Ambulatory Visit
Admission: RE | Admit: 2018-12-19 | Discharge: 2018-12-19 | Disposition: A | Discharge status: Home / Self Care | Payer: PPO | Source: Ambulatory Visit | Attending: Radiation Oncology | Admitting: Radiation Oncology

## 2018-12-19 ENCOUNTER — Other Ambulatory Visit: Payer: Self-pay

## 2018-12-19 ENCOUNTER — Encounter: Payer: Self-pay | Admitting: Radiation Oncology

## 2018-12-19 VITALS — BP 116/82 | HR 70 | Temp 97.8°F | Resp 20 | Ht 72.0 in | Wt 240.2 lb

## 2018-12-19 DIAGNOSIS — F419 Anxiety disorder, unspecified: Secondary | ICD-10-CM | POA: Diagnosis not present

## 2018-12-19 DIAGNOSIS — C774 Secondary and unspecified malignant neoplasm of inguinal and lower limb lymph nodes: Secondary | ICD-10-CM | POA: Diagnosis not present

## 2018-12-19 DIAGNOSIS — Z7982 Long term (current) use of aspirin: Secondary | ICD-10-CM | POA: Insufficient documentation

## 2018-12-19 DIAGNOSIS — C4371 Malignant melanoma of right lower limb, including hip: Secondary | ICD-10-CM

## 2018-12-19 DIAGNOSIS — E2740 Unspecified adrenocortical insufficiency: Secondary | ICD-10-CM | POA: Diagnosis not present

## 2018-12-19 DIAGNOSIS — Z89411 Acquired absence of right great toe: Secondary | ICD-10-CM | POA: Diagnosis not present

## 2018-12-19 DIAGNOSIS — Z510 Encounter for antineoplastic radiation therapy: Secondary | ICD-10-CM | POA: Insufficient documentation

## 2018-12-19 DIAGNOSIS — Z9221 Personal history of antineoplastic chemotherapy: Secondary | ICD-10-CM | POA: Insufficient documentation

## 2018-12-19 DIAGNOSIS — Z79899 Other long term (current) drug therapy: Secondary | ICD-10-CM | POA: Insufficient documentation

## 2018-12-19 DIAGNOSIS — C439 Malignant melanoma of skin, unspecified: Secondary | ICD-10-CM

## 2018-12-19 DIAGNOSIS — C799 Secondary malignant neoplasm of unspecified site: Secondary | ICD-10-CM

## 2018-12-19 NOTE — Patient Instructions (Signed)
Coronavirus (COVID-19) Are you at risk?  Are you at risk for the Coronavirus (COVID-19)?  To be considered HIGH RISK for Coronavirus (COVID-19), you have to meet the following criteria:  . Traveled to [place], [place], [place], [place] or [place]; or in the [place] to [place], [place], [place], or [place]; and have fever, cough, and shortness of breath within the last 2 weeks of travel OR . Been in close contact with a person diagnosed with COVID-19 within the last 2 weeks and have fever, cough, and shortness of breath . IF YOU DO NOT MEET THESE CRITERIA, YOU ARE CONSIDERED LOW RISK FOR COVID-19.  What to do if you are HIGH RISK for COVID-19?  . If you are having a medical emergency, call 911. . Seek medical care right away. Before you go to a doctor's office, urgent care or emergency department, call ahead and tell them about your recent travel, contact with someone diagnosed with COVID-19, and your symptoms. You should receive instructions from your physician's office regarding next steps of care.  . When you arrive at healthcare provider, tell the healthcare staff immediately you have returned from visiting [place], [place], [place], [place] or [place]; or traveled in the [place] to [place], [place], [place], or [place]; in the last two weeks or you have been in close contact with a person diagnosed with COVID-19 in the last 2 weeks.   . Tell the health care staff about your symptoms: fever, cough and shortness of breath. . After you have been seen by a medical provider, you will be either: o Tested for (COVID-19) and discharged home on quarantine except to seek medical care if symptoms worsen, and asked to  - Stay home and avoid contact with others until you get your results (4-5 days)  - Avoid travel on public transportation if possible (such as bus, train, or airplane) or o Sent to the Emergency Department by EMS for evaluation, COVID-19 testing, and possible  admission depending on your condition and test results.  What to do if you are LOW RISK for COVID-19?  Reduce your risk of any infection by using the same precautions used for avoiding the common cold or flu:  . Wash your hands often with soap and warm water for at least 20 seconds.  If soap and water are not readily available, use an alcohol-based hand sanitizer with at least 60% alcohol.  . If coughing or sneezing, cover your mouth and nose by coughing or sneezing into the elbow areas of your shirt or coat, into a tissue or into your sleeve (not your hands). . Avoid shaking hands with others and consider head nods or verbal greetings only. . Avoid touching your eyes, nose, or mouth with unwashed hands.  . Avoid close contact with people who are sick. . Avoid places or events with large numbers of people in one location, like concerts or sporting events. . Carefully consider travel plans you have or are making. . If you are planning any travel outside or inside the [place], visit the CDC's Travelers' Health webpage for the latest health notices. . If you have some symptoms but not all symptoms, continue to monitor at home and seek medical attention if your symptoms worsen. . If you are having a medical emergency, call 911.   ADDITIONAL HEALTHCARE OPTIONS FOR PATIENTS  [facility] / e-Visit: [email or web address]         [facility]: [phone]  [facility]: [phone]                   [facility]: [phone]   

## 2018-12-19 NOTE — Progress Notes (Signed)
Radiation Oncology         [phone] ________________________________  Initial Outpatient Consultation  Name: James Byrd MRN: [id number]  Date: 12/19/2018  DOB: 07-Dec-1965  DE:YCXKGY, Sharon Mt, MD  [name], MD   REFERRING PHYSICIAN: [name], MD  DIAGNOSIS: The encounter diagnosis was Malignant melanoma of right great toe Stage IIIB [phone]).     HISTORY OF PRESENT ILLNESS::James Byrd is a 53 y.o. male who  has been followed by Dr. Marin Olp for Stage IIIB [phone]) subungual melanoma of the right hallux since 10/2014. He initially presented with discoloration under the nail bed of his right great toe. Biopsy revealed malignant melanoma with involved margins. He underwent amputation of the toe, but pathology revealed a positive sentinel lymph node. He completed 3 cycles of Yervoy but experienced severe side effects.  Recent staging PET scan on 10/30/2018 revealed: new prominently enlarged (3.9 cm) hypermetabolic right inguinal lymph node compatible with malignancy; small adjacent right inguinal lymph node (1.4 cm); no other sites of active malignancy.  He proceeded to right inguinal lymph node biopsy and dissection on 11/06/2018. Pathology revealed two out of seven positive lymph nodes. The largest of the two measured 5.6 cm, contained minimal to no residual lymph node tissue, and extracapsular extension present.  Dr. Marin Olp plans to begin [place] on nivolumab in the near future.   PREVIOUS RADIATION THERAPY: No  PAST MEDICAL HISTORY:  has a past medical history of Adrenal insufficiency ([place]), Anxiety (88/10/275), Complication of anesthesia, Goals of care, counseling/discussion (12/17/2018), Gout, History of kidney stones, HLD (hyperlipidemia), Malignant melanoma of right great toe ([place]) (11/14/2014), and Paroxysmal supraventricular tachycardia ([place]) (05/24/2012).    PAST SURGICAL HISTORY: Past Surgical History:  Procedure Laterality Date  . ABLATION  8 YRS  AGO  . CHOLECYSTECTOMY    . EXTRACORPOREAL SHOCK WAVE LITHOTRIPSY  5 YRS AGO  . LYMPH NODE BIOPSY Right 11/06/2018   Procedure: RIGHT INGUINAL LYMPH NODE BIOPSY AND POSSIBLE RIGHT GROIN NODE DISECTION;  Surgeon: Alphonsa Overall, MD;  Location: [facility];  Service: General;  Laterality: Right;  . r toe partial ambutation    . ROTATOR CUFF REPAIR Right 2010   3 yrs ago    FAMILY HISTORY: family history includes COPD in his father; Diabetes in his brother, father, and sister; Stroke in his mother. He was adopted.  SOCIAL HISTORY:  reports that he has never smoked. He has never used smokeless tobacco. He reports that he does not drink alcohol or use drugs.  ALLERGIES: Patient has no known allergies.  MEDICATIONS:  Current Outpatient Medications  Medication Sig Dispense Refill  . Ascorbic Acid (VITAMIN C) 1000 MG tablet Take 1,000 mg by mouth daily.    Marland Kitchen aspirin-sod bicarb-citric acid (ALKA-SELTZER) 325 MG TBEF tablet Take 650 mg by mouth daily as needed (indigestion).    Marland Kitchen augmented betamethasone dipropionate (DIPROLENE-AF) 0.05 % cream Apply 1 application topically 2 (two) times daily as needed (psoriasis).     . Calcium Carb-Cholecalciferol (CALCIUM 600 + D PO) Take 1 tablet by mouth at bedtime.    . Cholecalciferol (VITAMIN D) 2000 units tablet Take 2,000 Units by mouth daily.    . diclofenac sodium (VOLTAREN) 1 % GEL     . fludrocortisone (FLORINEF) 0.1 MG tablet Take 0.1 mg by mouth daily.    Marland Kitchen gabapentin (NEURONTIN) 600 MG tablet Take 600 mg by mouth 2 (two) times daily.    . hydrocortisone (CORTEF) 5 MG tablet Take 5 mg by mouth 2 (two) times  daily.    . Multiple Vitamin (MULTIVITAMIN) tablet Take 1 tablet by mouth daily.    . SUPER B COMPLEX/C PO Take 1 tablet by mouth daily.    . tadalafil (CIALIS) 10 MG tablet Take 1 tablet 30 min prior to sexual activity 10 tablet 0  . testosterone (ANDROGEL) 50 MG/5GM (1%) GEL Place 5 g onto the skin daily.    . Testosterone 20.25 MG/ACT (1.62%) GEL      . Tetrahydrozoline HCl (VISINE OP) Place 1 drop into both eyes daily as needed (irritation).    . traMADol (ULTRAM) 50 MG tablet Take 1-2 tablets (50-100 mg total) by mouth every 6 (six) hours as needed. 20 tablet 1   No current facility-administered medications for this encounter.    Facility-Administered Medications Ordered in Other Encounters  Medication Dose Route Frequency Provider Last Rate Last Dose  . 0.9 %  sodium chloride infusion   Intravenous Continuous [name], MD 500 mL/hr at 02/05/15 1510      REVIEW OF SYSTEMS:  A 10+ POINT REVIEW OF SYSTEMS WAS OBTAINED including neurology, dermatology, psychiatry, cardiac, respiratory, lymph, extremities, GI, GU, musculoskeletal, constitutional, reproductive, HEENT. He denies any pain and any other symptoms. He expressed frustration with the visitor restrictions in place due to the pandemic.   PHYSICAL EXAM:  height is 6' (1.829 m) and weight is 240 lb 3.2 oz (109 kg). His oral temperature is 97.8 F (36.6 C). His blood pressure is 116/82 and his pulse is 70. His respiration is 20 and oxygen saturation is 97%.   General: Alert and oriented, in no acute distress HEENT: Head is normocephalic. Extraocular movements are intact. Oropharynx is clear. Neck: Neck is supple, no palpable cervical or supraclavicular lymphadenopathy. Heart: Regular in rate and rhythm with no murmurs, rubs, or gallops. Chest: Clear to auscultation bilaterally, with no rhonchi, wheezes, or rales. Abdomen: Soft, nontender, nondistended, with no rigidity or guarding. Extremities: No cyanosis or edema. Lymphatics: see Neck Exam Skin: No concerning lesions. Musculoskeletal: symmetric strength and muscle tone throughout. Neurologic: Cranial nerves II through XII are grossly intact. No obvious focalities. Speech is fluent. Coordination is intact. Psychiatric: Judgment and insight are intact. Affect is appropriate. Examination of the right inguinal area reveals a  surgical scar which is healing well.  No palpable or visible signs of recurrence along the operative bed.  No significant swelling is noted along the right lower extremity.  The patient has a normal circumcised male.  ECOG = 1  LABORATORY DATA:  Lab Results  Component Value Date   WBC 4.2 12/13/2018   HGB 14.5 12/13/2018   HCT 44.0 12/13/2018   MCV 90.3 12/13/2018   PLT 214 12/13/2018   NEUTROABS 2.3 12/13/2018   Lab Results  Component Value Date   NA 143 12/13/2018   K 4.1 12/13/2018   CL 105 12/13/2018   CO2 28 12/13/2018   GLUCOSE 111 (H) 12/13/2018   CREATININE 1.07 12/13/2018   CALCIUM 9.6 12/13/2018      RADIOGRAPHY: No results found.    IMPRESSION: Stage IIIB Subungual Melanoma with Nodal Recurrence.  Patient presented with isolated recurrence in the right inguinal area.  He proceeded to undergo inguinal node dissection revealing 2 involved nodes.  1 of these nodes showed extracapsular extension and 1 lymph node measured 5.6 cm.  Given these findings I feel the patient will be at risk for local recurrence within the surgical bed and would recommend postoperative radiation therapy.  Discussed the course of treatment  side effects and potential toxicities of radiation therapy in the situation with the patient.  He appears to understand and wishes to proceed with planned course of treatment.  Patient understands that this may provide local control benefit but no overall survival benefit.  Patient understands that this radiation therapy will increase his risk for lymphedema involving the right lower extremity but he is willing to accept this risk to avoid potential local recurrence.  He understands that physical therapy could be helpful if he does develop lymphedema at a later date.   The patient was encouraged to ask questions that I answered to the best of my ability.   A patient consent form was discussed and signed.  We retained a copy for our records.  The patient would like to  proceed with radiation and will be scheduled for CT simulation.  PLAN: CT simulation this afternoon with treatments to begin next week.  Anticipate 20 treatments directed at the operative bed.  We will give higher dose per fraction given the pathologic findings of melanoma and the tendency of this malignancy to be radioresistant.    ------------------------------------------------  Blair Promise, PhD, MD  This document serves as a record of services personally performed by Gery Pray, MD. It was created on his behalf by Wilburn Mylar, a trained medical scribe. The creation of this record is based on the scribe's personal observations and the provider's statements to them. This document has been checked and approved by the attending provider.

## 2018-12-20 DIAGNOSIS — C4371 Malignant melanoma of right lower limb, including hip: Secondary | ICD-10-CM | POA: Diagnosis not present

## 2018-12-20 DIAGNOSIS — Z510 Encounter for antineoplastic radiation therapy: Secondary | ICD-10-CM | POA: Diagnosis not present

## 2018-12-27 ENCOUNTER — Other Ambulatory Visit: Payer: Self-pay

## 2018-12-27 ENCOUNTER — Ambulatory Visit
Admission: RE | Admit: 2018-12-27 | Discharge: 2018-12-27 | Disposition: A | Discharge status: Home / Self Care | Payer: PPO | Source: Ambulatory Visit | Attending: Radiation Oncology | Admitting: Radiation Oncology

## 2018-12-27 DIAGNOSIS — C4371 Malignant melanoma of right lower limb, including hip: Secondary | ICD-10-CM

## 2018-12-27 DIAGNOSIS — Z510 Encounter for antineoplastic radiation therapy: Secondary | ICD-10-CM | POA: Diagnosis not present

## 2018-12-27 NOTE — Progress Notes (Signed)
  Radiation Oncology         [phone] ________________________________  Name: James Byrd MRN: [id number]  Date: 12/27/2018  DOB: 1965/08/20  Simulation Verification Note    ICD-10-CM   1. Malignant melanoma of right great toe Stage IIIB ([id number])  C43.71     Status: outpatient  NARRATIVE: The patient was brought to the treatment unit and placed in the planned treatment position. The clinical setup was verified. Then port films were obtained and uploaded to the radiation oncology medical record software.  The treatment beams were carefully compared against the planned radiation fields. The position location and shape of the radiation fields was reviewed. They targeted volume of tissue appears to be appropriately covered by the radiation beams. Organs at risk appear to be excluded as planned.  Based on my personal review, I approved the simulation verification. The patient's treatment will proceed as planned.  -----------------------------------  Blair Promise, PhD, MD  This document serves as a record of services personally performed by Gery Pray, MD. It was created on his behalf by Rae Lips, a trained medical scribe. The creation of this record is based on the scribe's personal observations and the provider's statements to them. This document has been checked and approved by the attending provider.

## 2018-12-28 ENCOUNTER — Other Ambulatory Visit: Payer: Self-pay

## 2018-12-28 ENCOUNTER — Ambulatory Visit
Admission: RE | Admit: 2018-12-28 | Discharge: 2018-12-28 | Disposition: A | Discharge status: Home / Self Care | Payer: PPO | Source: Ambulatory Visit | Attending: Radiation Oncology | Admitting: Radiation Oncology

## 2018-12-28 DIAGNOSIS — C4371 Malignant melanoma of right lower limb, including hip: Secondary | ICD-10-CM | POA: Diagnosis not present

## 2018-12-28 DIAGNOSIS — Z510 Encounter for antineoplastic radiation therapy: Secondary | ICD-10-CM | POA: Diagnosis not present

## 2018-12-31 ENCOUNTER — Ambulatory Visit
Admission: RE | Admit: 2018-12-31 | Discharge: 2018-12-31 | Disposition: A | Discharge status: Home / Self Care | Payer: PPO | Source: Ambulatory Visit | Attending: Radiation Oncology | Admitting: Radiation Oncology

## 2018-12-31 DIAGNOSIS — Z510 Encounter for antineoplastic radiation therapy: Secondary | ICD-10-CM | POA: Diagnosis not present

## 2018-12-31 DIAGNOSIS — C4371 Malignant melanoma of right lower limb, including hip: Secondary | ICD-10-CM | POA: Diagnosis not present

## 2019-01-01 ENCOUNTER — Ambulatory Visit
Admission: RE | Admit: 2019-01-01 | Discharge: 2019-01-01 | Disposition: A | Discharge status: Home / Self Care | Payer: PPO | Source: Ambulatory Visit | Attending: Radiation Oncology | Admitting: Radiation Oncology

## 2019-01-01 DIAGNOSIS — Z510 Encounter for antineoplastic radiation therapy: Secondary | ICD-10-CM | POA: Diagnosis not present

## 2019-01-01 DIAGNOSIS — C4371 Malignant melanoma of right lower limb, including hip: Secondary | ICD-10-CM | POA: Diagnosis not present

## 2019-01-02 ENCOUNTER — Ambulatory Visit
Admission: RE | Admit: 2019-01-02 | Discharge: 2019-01-02 | Disposition: A | Discharge status: Home / Self Care | Payer: PPO | Source: Ambulatory Visit | Attending: Radiation Oncology | Admitting: Radiation Oncology

## 2019-01-02 ENCOUNTER — Other Ambulatory Visit: Payer: Self-pay

## 2019-01-02 DIAGNOSIS — Z510 Encounter for antineoplastic radiation therapy: Secondary | ICD-10-CM | POA: Diagnosis not present

## 2019-01-02 DIAGNOSIS — C4371 Malignant melanoma of right lower limb, including hip: Secondary | ICD-10-CM | POA: Diagnosis not present

## 2019-01-03 ENCOUNTER — Inpatient Hospital Stay: Payer: PPO

## 2019-01-03 ENCOUNTER — Ambulatory Visit
Admission: RE | Admit: 2019-01-03 | Discharge: 2019-01-03 | Disposition: A | Discharge status: Home / Self Care | Payer: PPO | Source: Ambulatory Visit | Attending: Radiation Oncology | Admitting: Radiation Oncology

## 2019-01-03 ENCOUNTER — Other Ambulatory Visit: Payer: Self-pay

## 2019-01-03 ENCOUNTER — Inpatient Hospital Stay: Payer: PPO | Attending: Hematology & Oncology | Admitting: Hematology & Oncology

## 2019-01-03 VITALS — BP 117/78 | HR 77 | Temp 97.6°F | Resp 18 | Wt 241.0 lb

## 2019-01-03 DIAGNOSIS — Z5112 Encounter for antineoplastic immunotherapy: Secondary | ICD-10-CM | POA: Diagnosis not present

## 2019-01-03 DIAGNOSIS — C4371 Malignant melanoma of right lower limb, including hip: Secondary | ICD-10-CM | POA: Diagnosis not present

## 2019-01-03 DIAGNOSIS — Z510 Encounter for antineoplastic radiation therapy: Secondary | ICD-10-CM | POA: Diagnosis not present

## 2019-01-03 DIAGNOSIS — C774 Secondary and unspecified malignant neoplasm of inguinal and lower limb lymph nodes: Secondary | ICD-10-CM

## 2019-01-03 LAB — CMP (CANCER CENTER ONLY)
ALT: 11 U/L (ref 0–44)
AST: 13 U/L — ABNORMAL LOW (ref 15–41)
Albumin: 4.3 g/dL (ref 3.5–5.0)
Alkaline Phosphatase: 64 U/L (ref 38–126)
Anion gap: 7 (ref 5–15)
BUN: 13 mg/dL (ref 6–20)
CO2: 26 mmol/L (ref 22–32)
Calcium: 8.5 mg/dL — ABNORMAL LOW (ref 8.9–10.3)
Chloride: 109 mmol/L (ref 98–111)
Creatinine: 0.97 mg/dL (ref 0.61–1.24)
GFR, Est AFR Am: >60 mL/min (ref >60)
GFR, Estimated: >60 mL/min (ref >60)
Glucose, Bld: 80 mg/dL (ref 70–99)
Potassium: 3.6 mmol/L (ref 3.5–5.1)
Sodium: 142 mmol/L (ref 135–145)
Total Bilirubin: 0.6 mg/dL (ref 0.3–1.2)
Total Protein: 6.7 g/dL (ref 6.5–8.1)

## 2019-01-03 LAB — CBC WITH DIFFERENTIAL (CANCER CENTER ONLY)
Abs Immature Granulocytes: 0.01 10*3/uL (ref 0.00–0.07)
Basophils Absolute: 0 10*3/uL (ref 0.0–0.1)
Basophils Relative: 1 %
Eosinophils Absolute: 0.3 10*3/uL (ref 0.0–0.5)
Eosinophils Relative: 7 %
HCT: 42.6 % (ref 39.0–52.0)
Hemoglobin: 14.4 g/dL (ref 13.0–17.0)
Immature Granulocytes: 0 %
Lymphocytes Relative: 36 %
Lymphs Abs: 1.5 10*3/uL (ref 0.7–4.0)
MCH: 29.3 pg (ref 26.0–34.0)
MCHC: 33.8 g/dL (ref 30.0–36.0)
MCV: 86.6 fL (ref 80.0–100.0)
Monocytes Absolute: 0.4 10*3/uL (ref 0.1–1.0)
Monocytes Relative: 9 %
Neutro Abs: 1.9 10*3/uL (ref 1.7–7.7)
Neutrophils Relative %: 47 %
Platelet Count: 173 10*3/uL (ref 150–400)
RBC: 4.92 MIL/uL (ref 4.22–5.81)
RDW: 13 % (ref 11.5–15.5)
WBC Count: 4 10*3/uL (ref 4.0–10.5)
nRBC: 0 % (ref 0.0–0.2)

## 2019-01-03 LAB — LACTATE DEHYDROGENASE: LDH: 148 U/L (ref 98–192)

## 2019-01-03 MED ORDER — SODIUM CHLORIDE 0.9 % IV SOLN
Freq: Once | INTRAVENOUS | Status: AC
  Administered 2019-01-03: 11:00:00 via INTRAVENOUS
  Filled 2019-01-03: qty 250

## 2019-01-03 MED ORDER — SODIUM CHLORIDE 0.9 % IV SOLN
480.0000 mg | Freq: Once | INTRAVENOUS | Status: AC
  Administered 2019-01-03: 480 mg via INTRAVENOUS
  Filled 2019-01-03: qty 48

## 2019-01-03 NOTE — Patient Instructions (Signed)
Nivolumab injection What is this medicine? NIVOLUMAB (nye VOL ue mab) is a monoclonal antibody. It is used to treat melanoma, lung cancer, kidney cancer, head and neck cancer, Hodgkin lymphoma, urothelial cancer, colon cancer, and liver cancer. This medicine may be used for other purposes; ask your health care provider or pharmacist if you have questions. COMMON BRAND NAME(S): Opdivo What should I tell my health care provider before I take this medicine? They need to know if you have any of these conditions:  diabetes  immune system problems  kidney disease  liver disease  lung disease  organ transplant  stomach or intestine problems  thyroid disease  an unusual or allergic reaction to nivolumab, other medicines, foods, dyes, or preservatives  pregnant or trying to get pregnant  breast-feeding How should I use this medicine? This medicine is for infusion into a vein. It is given by a health care professional in a hospital or clinic setting. A special MedGuide will be given to you before each treatment. Be sure to read this information carefully each time. Talk to your pediatrician regarding the use of this medicine in children. While this drug may be prescribed for children as young as 12 years for selected conditions, precautions do apply. Overdosage: If you think you have taken too much of this medicine contact a poison control center or emergency room at once. NOTE: This medicine is only for you. Do not share this medicine with others. What if I miss a dose? It is important not to miss your dose. Call your doctor or health care professional if you are unable to keep an appointment. What may interact with this medicine? Interactions have not been studied. Give your health care provider a list of all the medicines, herbs, non-prescription drugs, or dietary supplements you use. Also tell them if you smoke, drink alcohol, or use illegal drugs. Some items may interact with your  medicine. This list may not describe all possible interactions. Give your health care provider a list of all the medicines, herbs, non-prescription drugs, or dietary supplements you use. Also tell them if you smoke, drink alcohol, or use illegal drugs. Some items may interact with your medicine. What should I watch for while using this medicine? This drug may make you feel generally unwell. Continue your course of treatment even though you feel ill unless your doctor tells you to stop. You may need blood work done while you are taking this medicine. Do not become pregnant while taking this medicine or for 5 months after stopping it. Women should inform their doctor if they wish to become pregnant or think they might be pregnant. There is a potential for serious side effects to an unborn child. Talk to your health care professional or pharmacist for more information. Do not breast-feed an infant while taking this medicine or for 5 months after stopping it. What side effects may I notice from receiving this medicine? Side effects that you should report to your doctor or health care professional as soon as possible:  allergic reactions like skin rash, itching or hives, swelling of the face, lips, or tongue  breathing problems  blood in the urine  bloody or watery diarrhea or black, tarry stools  changes in emotions or moods  changes in vision  chest pain  cough  dizziness  feeling faint or lightheaded, falls  fever, chills  headache with fever, neck stiffness, confusion, loss of memory, sensitivity to light, hallucination, loss of contact with reality, or seizures  joint   pain  mouth sores  redness, blistering, peeling or loosening of the skin, including inside the mouth  severe muscle pain or weakness  signs and symptoms of high blood sugar such as dizziness; dry mouth; dry skin; fruity breath; nausea; stomach pain; increased hunger or thirst; increased urination  signs and  symptoms of kidney injury like trouble passing urine or change in the amount of urine  signs and symptoms of liver injury like dark yellow or brown urine; general ill feeling or flu-like symptoms; light-colored stools; loss of appetite; nausea; right upper belly pain; unusually weak or tired; yellowing of the eyes or skin  swelling of the ankles, feet, hands  trouble passing urine or change in the amount of urine  unusually weak or tired  weight gain or loss Side effects that usually do not require medical attention (report to your doctor or health care professional if they continue or are bothersome):  bone pain  constipation  decreased appetite  diarrhea  muscle pain  nausea, vomiting  tiredness This list may not describe all possible side effects. Call your doctor for medical advice about side effects. You may report side effects to FDA at 1-800-FDA-1088. Where should I keep my medicine? This drug is given in a hospital or clinic and will not be stored at home. NOTE: This sheet is a summary. It may not cover all possible information. If you have questions about this medicine, talk to your doctor, pharmacist, or health care provider.  2020 Elsevier/Gold Standard (2017-10-25 12:55:04)  

## 2019-01-03 NOTE — Progress Notes (Signed)
Hematology and Oncology Follow Up Visit  James Byrd [id number] [birth year] 53 y.o. 01/03/2019   Principle Diagnosis:  Stage IIIB (T3bN1aM0) subungual melanoma of the right hallux -- nodal recurrence in the RIGHT inguinal nodes -- BRAF wt  Current Therapy:   Yervoy-status post 3 cycles - discontinued in September 2016 secondary to toxicity  XRT to the RIGHT inguinal basin Nivolumab 400 mg q month -- start in 12/2018   Interim History:  James Byrd is here today for follow-up.  He started his radiation therapy 5 days ago.  He has a little bit of dysuria right now.  I told him to talk to the radiation oncologist when he sees him today.  He has had no lymphedema right leg.  There is no weakness of the right leg.  He is had no problems with bowels.  He said no cough or shortness of breath.  Overall, his performance status is ECOG 1.  Allergies: No Known Allergies  Past Medical History, Surgical history, Social history, and Family History were reviewed and updated.  Review of Systems: Review of Systems  Constitutional: Positive for malaise/fatigue.  HENT: Negative.   Eyes: Negative.   Respiratory: Positive for shortness of breath.   Cardiovascular: Negative.   Gastrointestinal: Positive for constipation and nausea.  Genitourinary: Negative.   Musculoskeletal: Positive for joint pain and myalgias.  Skin: Negative.   Neurological: Positive for tingling and focal weakness.  Endo/Heme/Allergies: Negative.   Psychiatric/Behavioral: Positive for memory loss.     Physical Exam:  weight is 241 lb (109.3 kg). His oral temperature is 97.6 F (36.4 C). His blood pressure is 117/78 and his pulse is 77. His respiration is 18 and oxygen saturation is 97%.   Wt Readings from Last 3 Encounters:  01/03/19 241 lb (109.3 kg)  12/19/18 240 lb 3.2 oz (109 kg)  12/13/18 237 lb (107.5 kg)    Physical Exam Vitals signs reviewed.  HENT:     Head: Normocephalic and atraumatic.  Eyes:     Pupils: Pupils are equal, round, and reactive to light.  Neck:     Musculoskeletal: Normal range of motion.  Cardiovascular:     Rate and Rhythm: Normal rate and regular rhythm.     Heart sounds: Normal heart sounds.  Pulmonary:     Effort: Pulmonary effort is normal.     Breath sounds: Normal breath sounds.  Abdominal:     General: Bowel sounds are normal.     Palpations: Abdomen is soft.     Comments: Abdominal exam shows a soft abdomen.  Bowel sounds are present.  There is no palpable abdominal mass.  There is no palpable liver or spleen tip.  He has a healing right inguinal lymphadenectomy scar.  I cannot palpate any obvious adenopathy.  The left inguinal area is unremarkable.    Musculoskeletal: Normal range of motion.        General: No tenderness or deformity.     Comments: The big toe on the right foot is amputated.  There is no lymphedema in the right leg.  He has good pulses in his distal extremities.  Left leg is unremarkable.  Lymphadenopathy:     Cervical: No cervical adenopathy.  Skin:    General: Skin is warm and dry.     Findings: No erythema or rash.  Neurological:     Mental Status: He is alert and oriented to person, place, and time.  Psychiatric:        Behavior: Behavior normal.  Thought Content: Thought content normal.        Judgment: Judgment normal.      Lab Results  Component Value Date   WBC 4.0 01/03/2019   HGB 14.4 01/03/2019   HCT 42.6 01/03/2019   MCV 86.6 01/03/2019   PLT 173 01/03/2019   Lab Results  Component Value Date   FERRITIN 363 (H) 09/04/2015   IRON 55 09/04/2015   TIBC 200 (L) 09/04/2015   UIBC 145 09/04/2015   IRONPCTSAT 28 09/04/2015   Lab Results  Component Value Date   RBC 4.92 01/03/2019   No results found for: KPAFRELGTCHN, LAMBDASER, KAPLAMBRATIO No results found for: IGGSERUM, IGA, IGMSERUM No results found for: Odetta Pink, SPEI   Chemistry       Component Value Date/Time   NA 142 01/03/2019 0829   NA 145 06/06/2017 1316   NA 141 10/16/2015 1334   K 3.6 01/03/2019 0829   K 4.1 06/06/2017 1316   K 3.9 10/16/2015 1334   CL 109 01/03/2019 0829   CL 108 06/06/2017 1316   CO2 26 01/03/2019 0829   CO2 26 06/06/2017 1316   CO2 21 (L) 10/16/2015 1334   BUN 13 01/03/2019 0829   BUN 19 06/06/2017 1316   BUN 20.0 10/16/2015 1334   CREATININE 0.97 01/03/2019 0829   CREATININE 1.0 06/06/2017 1316   CREATININE 1.0 10/16/2015 1334      Component Value Date/Time   CALCIUM 8.5 (L) 01/03/2019 0829   CALCIUM 9.4 06/06/2017 1316   CALCIUM 9.7 10/16/2015 1334   ALKPHOS 64 01/03/2019 0829   ALKPHOS 78 06/06/2017 1316   ALKPHOS 62 10/16/2015 1334   AST 13 (L) 01/03/2019 0829   AST 16 10/16/2015 1334   ALT 11 01/03/2019 0829   ALT 27 06/06/2017 1316   ALT 11 10/16/2015 1334   BILITOT 0.6 01/03/2019 0829   BILITOT 0.49 10/16/2015 1334      Impression and Plan: James Byrd is a very pleasant 53 yo caucasian gentleman caucasian gentleman with history of stage IIIB melanoma of the right hallux that was subungual with one microscopic positive inguinal lymph node. He completed 3 cycles of Yervoy but stopped due to side effects.   We will go ahead and proceed with his nivolumab today.  I really think he would do okay with the nivolumab.  We will have him back in a month.  By then, the radiation will be finished.    [name], MD 7/16/202010:04 AM

## 2019-01-04 ENCOUNTER — Other Ambulatory Visit: Payer: Self-pay | Admitting: Hematology & Oncology

## 2019-01-04 ENCOUNTER — Ambulatory Visit
Admission: RE | Admit: 2019-01-04 | Discharge: 2019-01-04 | Disposition: A | Discharge status: Home / Self Care | Payer: PPO | Source: Ambulatory Visit | Attending: Radiation Oncology | Admitting: Radiation Oncology

## 2019-01-04 DIAGNOSIS — Z510 Encounter for antineoplastic radiation therapy: Secondary | ICD-10-CM | POA: Diagnosis not present

## 2019-01-04 DIAGNOSIS — C4371 Malignant melanoma of right lower limb, including hip: Secondary | ICD-10-CM | POA: Diagnosis not present

## 2019-01-07 ENCOUNTER — Ambulatory Visit
Admission: RE | Admit: 2019-01-07 | Discharge: 2019-01-07 | Disposition: A | Discharge status: Home / Self Care | Payer: PPO | Source: Ambulatory Visit | Attending: Radiation Oncology | Admitting: Radiation Oncology

## 2019-01-07 ENCOUNTER — Other Ambulatory Visit: Payer: Self-pay

## 2019-01-07 DIAGNOSIS — Z510 Encounter for antineoplastic radiation therapy: Secondary | ICD-10-CM | POA: Diagnosis not present

## 2019-01-07 DIAGNOSIS — C4371 Malignant melanoma of right lower limb, including hip: Secondary | ICD-10-CM | POA: Diagnosis not present

## 2019-01-08 ENCOUNTER — Ambulatory Visit
Admission: RE | Admit: 2019-01-08 | Discharge: 2019-01-08 | Disposition: A | Discharge status: Home / Self Care | Payer: PPO | Source: Ambulatory Visit | Attending: Radiation Oncology | Admitting: Radiation Oncology

## 2019-01-08 DIAGNOSIS — C4371 Malignant melanoma of right lower limb, including hip: Secondary | ICD-10-CM | POA: Diagnosis not present

## 2019-01-08 DIAGNOSIS — Z510 Encounter for antineoplastic radiation therapy: Secondary | ICD-10-CM | POA: Diagnosis not present

## 2019-01-09 ENCOUNTER — Ambulatory Visit
Admission: RE | Admit: 2019-01-09 | Discharge: 2019-01-09 | Disposition: A | Discharge status: Home / Self Care | Payer: PPO | Source: Ambulatory Visit | Attending: Radiation Oncology | Admitting: Radiation Oncology

## 2019-01-09 DIAGNOSIS — C4371 Malignant melanoma of right lower limb, including hip: Secondary | ICD-10-CM | POA: Diagnosis not present

## 2019-01-09 DIAGNOSIS — Z510 Encounter for antineoplastic radiation therapy: Secondary | ICD-10-CM | POA: Diagnosis not present

## 2019-01-10 ENCOUNTER — Ambulatory Visit
Admission: RE | Admit: 2019-01-10 | Discharge: 2019-01-10 | Disposition: A | Discharge status: Home / Self Care | Payer: PPO | Source: Ambulatory Visit | Attending: Radiation Oncology | Admitting: Radiation Oncology

## 2019-01-10 DIAGNOSIS — E7800 Pure hypercholesterolemia, unspecified: Secondary | ICD-10-CM | POA: Diagnosis not present

## 2019-01-10 DIAGNOSIS — C4371 Malignant melanoma of right lower limb, including hip: Secondary | ICD-10-CM | POA: Diagnosis not present

## 2019-01-10 DIAGNOSIS — R112 Nausea with vomiting, unspecified: Secondary | ICD-10-CM | POA: Diagnosis present

## 2019-01-10 DIAGNOSIS — Z79899 Other long term (current) drug therapy: Secondary | ICD-10-CM | POA: Diagnosis not present

## 2019-01-10 DIAGNOSIS — Z823 Family history of stroke: Secondary | ICD-10-CM | POA: Diagnosis not present

## 2019-01-10 DIAGNOSIS — R0902 Hypoxemia: Secondary | ICD-10-CM | POA: Diagnosis not present

## 2019-01-10 DIAGNOSIS — Z452 Encounter for adjustment and management of vascular access device: Secondary | ICD-10-CM | POA: Diagnosis not present

## 2019-01-10 DIAGNOSIS — C774 Secondary and unspecified malignant neoplasm of inguinal and lower limb lymph nodes: Secondary | ICD-10-CM | POA: Diagnosis present

## 2019-01-10 DIAGNOSIS — E876 Hypokalemia: Secondary | ICD-10-CM | POA: Diagnosis not present

## 2019-01-10 DIAGNOSIS — Z03818 Encounter for observation for suspected exposure to other biological agents ruled out: Secondary | ICD-10-CM | POA: Diagnosis not present

## 2019-01-10 DIAGNOSIS — Z85820 Personal history of malignant melanoma of skin: Secondary | ICD-10-CM | POA: Diagnosis not present

## 2019-01-10 DIAGNOSIS — Q2546 Tortuous aortic arch: Secondary | ICD-10-CM | POA: Diagnosis not present

## 2019-01-10 DIAGNOSIS — Z8679 Personal history of other diseases of the circulatory system: Secondary | ICD-10-CM | POA: Diagnosis not present

## 2019-01-10 DIAGNOSIS — R197 Diarrhea, unspecified: Secondary | ICD-10-CM | POA: Diagnosis not present

## 2019-01-10 DIAGNOSIS — N179 Acute kidney failure, unspecified: Secondary | ICD-10-CM | POA: Diagnosis not present

## 2019-01-10 DIAGNOSIS — G629 Polyneuropathy, unspecified: Secondary | ICD-10-CM | POA: Diagnosis present

## 2019-01-10 DIAGNOSIS — A419 Sepsis, unspecified organism: Secondary | ICD-10-CM | POA: Diagnosis not present

## 2019-01-10 DIAGNOSIS — Z9225 Personal history of immunosupression therapy: Secondary | ICD-10-CM | POA: Diagnosis not present

## 2019-01-10 DIAGNOSIS — J9601 Acute respiratory failure with hypoxia: Secondary | ICD-10-CM | POA: Diagnosis not present

## 2019-01-10 DIAGNOSIS — Y95 Nosocomial condition: Secondary | ICD-10-CM | POA: Diagnosis present

## 2019-01-10 DIAGNOSIS — Z7952 Long term (current) use of systemic steroids: Secondary | ICD-10-CM | POA: Diagnosis not present

## 2019-01-10 DIAGNOSIS — J189 Pneumonia, unspecified organism: Secondary | ICD-10-CM | POA: Diagnosis not present

## 2019-01-10 DIAGNOSIS — Z833 Family history of diabetes mellitus: Secondary | ICD-10-CM | POA: Diagnosis not present

## 2019-01-10 DIAGNOSIS — Z923 Personal history of irradiation: Secondary | ICD-10-CM | POA: Diagnosis not present

## 2019-01-10 DIAGNOSIS — Z9221 Personal history of antineoplastic chemotherapy: Secondary | ICD-10-CM | POA: Diagnosis not present

## 2019-01-10 DIAGNOSIS — Z79890 Hormone replacement therapy (postmenopausal): Secondary | ICD-10-CM | POA: Diagnosis not present

## 2019-01-10 DIAGNOSIS — R6521 Severe sepsis with septic shock: Secondary | ICD-10-CM | POA: Diagnosis not present

## 2019-01-10 DIAGNOSIS — E878 Other disorders of electrolyte and fluid balance, not elsewhere classified: Secondary | ICD-10-CM | POA: Diagnosis present

## 2019-01-10 DIAGNOSIS — E2740 Unspecified adrenocortical insufficiency: Secondary | ICD-10-CM | POA: Diagnosis not present

## 2019-01-10 DIAGNOSIS — I10 Essential (primary) hypertension: Secondary | ICD-10-CM | POA: Diagnosis present

## 2019-01-10 DIAGNOSIS — R509 Fever, unspecified: Secondary | ICD-10-CM | POA: Diagnosis not present

## 2019-01-10 DIAGNOSIS — Z825 Family history of asthma and other chronic lower respiratory diseases: Secondary | ICD-10-CM | POA: Diagnosis not present

## 2019-01-11 ENCOUNTER — Ambulatory Visit
Admission: RE | Admit: 2019-01-11 | Discharge: 2019-01-11 | Disposition: A | Discharge status: Home / Self Care | Payer: PPO | Source: Ambulatory Visit | Attending: Radiation Oncology | Admitting: Radiation Oncology

## 2019-01-11 DIAGNOSIS — C4371 Malignant melanoma of right lower limb, including hip: Secondary | ICD-10-CM | POA: Diagnosis not present

## 2019-01-13 ENCOUNTER — Inpatient Hospital Stay (HOSPITAL_COMMUNITY): Payer: PPO

## 2019-01-13 ENCOUNTER — Inpatient Hospital Stay (HOSPITAL_COMMUNITY)
Admission: AD | Admit: 2019-01-13 | Discharge: 2019-01-16 | DRG: 871 | Disposition: A | Discharge status: Home / Self Care | Payer: PPO | Source: Other Acute Inpatient Hospital | Attending: Family Medicine | Admitting: Family Medicine

## 2019-01-13 ENCOUNTER — Telehealth: Payer: Self-pay | Admitting: *Deleted

## 2019-01-13 DIAGNOSIS — E7800 Pure hypercholesterolemia, unspecified: Secondary | ICD-10-CM | POA: Diagnosis not present

## 2019-01-13 DIAGNOSIS — C4371 Malignant melanoma of right lower limb, including hip: Secondary | ICD-10-CM

## 2019-01-13 DIAGNOSIS — A419 Sepsis, unspecified organism: Secondary | ICD-10-CM | POA: Diagnosis present

## 2019-01-13 DIAGNOSIS — E2740 Unspecified adrenocortical insufficiency: Secondary | ICD-10-CM

## 2019-01-13 DIAGNOSIS — Z85820 Personal history of malignant melanoma of skin: Secondary | ICD-10-CM | POA: Diagnosis not present

## 2019-01-13 DIAGNOSIS — Z833 Family history of diabetes mellitus: Secondary | ICD-10-CM | POA: Diagnosis not present

## 2019-01-13 DIAGNOSIS — J9601 Acute respiratory failure with hypoxia: Secondary | ICD-10-CM | POA: Diagnosis present

## 2019-01-13 DIAGNOSIS — E876 Hypokalemia: Secondary | ICD-10-CM | POA: Diagnosis not present

## 2019-01-13 DIAGNOSIS — I10 Essential (primary) hypertension: Secondary | ICD-10-CM | POA: Diagnosis present

## 2019-01-13 DIAGNOSIS — Y95 Nosocomial condition: Secondary | ICD-10-CM | POA: Diagnosis present

## 2019-01-13 DIAGNOSIS — N179 Acute kidney failure, unspecified: Secondary | ICD-10-CM | POA: Diagnosis present

## 2019-01-13 DIAGNOSIS — Z825 Family history of asthma and other chronic lower respiratory diseases: Secondary | ICD-10-CM

## 2019-01-13 DIAGNOSIS — R112 Nausea with vomiting, unspecified: Secondary | ICD-10-CM | POA: Diagnosis present

## 2019-01-13 DIAGNOSIS — Z79899 Other long term (current) drug therapy: Secondary | ICD-10-CM | POA: Diagnosis not present

## 2019-01-13 DIAGNOSIS — R0902 Hypoxemia: Secondary | ICD-10-CM | POA: Diagnosis not present

## 2019-01-13 DIAGNOSIS — G629 Polyneuropathy, unspecified: Secondary | ICD-10-CM | POA: Diagnosis present

## 2019-01-13 DIAGNOSIS — Z452 Encounter for adjustment and management of vascular access device: Secondary | ICD-10-CM | POA: Diagnosis not present

## 2019-01-13 DIAGNOSIS — Z9225 Personal history of immunosupression therapy: Secondary | ICD-10-CM | POA: Diagnosis not present

## 2019-01-13 DIAGNOSIS — E878 Other disorders of electrolyte and fluid balance, not elsewhere classified: Secondary | ICD-10-CM | POA: Diagnosis present

## 2019-01-13 DIAGNOSIS — Z7952 Long term (current) use of systemic steroids: Secondary | ICD-10-CM

## 2019-01-13 DIAGNOSIS — Z9221 Personal history of antineoplastic chemotherapy: Secondary | ICD-10-CM

## 2019-01-13 DIAGNOSIS — Z79890 Hormone replacement therapy (postmenopausal): Secondary | ICD-10-CM

## 2019-01-13 DIAGNOSIS — Z923 Personal history of irradiation: Secondary | ICD-10-CM

## 2019-01-13 DIAGNOSIS — R197 Diarrhea, unspecified: Secondary | ICD-10-CM | POA: Diagnosis not present

## 2019-01-13 DIAGNOSIS — C774 Secondary and unspecified malignant neoplasm of inguinal and lower limb lymph nodes: Secondary | ICD-10-CM | POA: Diagnosis present

## 2019-01-13 DIAGNOSIS — Z03818 Encounter for observation for suspected exposure to other biological agents ruled out: Secondary | ICD-10-CM | POA: Diagnosis not present

## 2019-01-13 DIAGNOSIS — R6521 Severe sepsis with septic shock: Secondary | ICD-10-CM

## 2019-01-13 DIAGNOSIS — J189 Pneumonia, unspecified organism: Secondary | ICD-10-CM | POA: Diagnosis present

## 2019-01-13 DIAGNOSIS — Z823 Family history of stroke: Secondary | ICD-10-CM | POA: Diagnosis not present

## 2019-01-13 DIAGNOSIS — Z8679 Personal history of other diseases of the circulatory system: Secondary | ICD-10-CM | POA: Diagnosis not present

## 2019-01-13 DIAGNOSIS — Q2546 Tortuous aortic arch: Secondary | ICD-10-CM | POA: Diagnosis not present

## 2019-01-13 LAB — GLUCOSE, CAPILLARY: Glucose-Capillary: 86 mg/dL (ref 70–99)

## 2019-01-13 LAB — MRSA PCR SCREENING: MRSA by PCR: NEGATIVE

## 2019-01-13 MED ORDER — NOREPINEPHRINE 4 MG/250ML-% IV SOLN
0.0000 ug/min | INTRAVENOUS | Status: DC
  Administered 2019-01-13: 6 ug/min via INTRAVENOUS
  Administered 2019-01-14: 8 ug/min via INTRAVENOUS
  Filled 2019-01-13 (×2): qty 250

## 2019-01-13 MED ORDER — LACTATED RINGERS IV SOLN
INTRAVENOUS | Status: DC
  Administered 2019-01-13: 75 mL/h via INTRAVENOUS
  Administered 2019-01-14: 09:00:00 via INTRAVENOUS

## 2019-01-13 MED ORDER — VANCOMYCIN HCL 10 G IV SOLR
1500.0000 mg | Freq: Once | INTRAVENOUS | Status: DC
  Filled 2019-01-13: qty 1500

## 2019-01-13 MED ORDER — ENOXAPARIN SODIUM 40 MG/0.4ML ~~LOC~~ SOLN
40.0000 mg | SUBCUTANEOUS | Status: DC
  Administered 2019-01-14 – 2019-01-15 (×2): 40 mg via SUBCUTANEOUS
  Filled 2019-01-13 (×2): qty 0.4

## 2019-01-13 MED ORDER — ORAL CARE MOUTH RINSE: 15.0000 mL | Freq: Two times a day (BID) | OROMUCOSAL | Status: DC

## 2019-01-13 MED ORDER — ONDANSETRON HCL 4 MG/2ML IJ SOLN
4.0000 mg | Freq: Four times a day (QID) | INTRAMUSCULAR | Status: DC | PRN
  Administered 2019-01-14 – 2019-01-15 (×2): 4 mg via INTRAVENOUS
  Filled 2019-01-13 (×2): qty 2

## 2019-01-13 MED ORDER — HYDROCORTISONE NA SUCCINATE PF 100 MG IJ SOLR
50.0000 mg | Freq: Four times a day (QID) | INTRAMUSCULAR | Status: DC
  Administered 2019-01-13 – 2019-01-15 (×7): 50 mg via INTRAVENOUS
  Filled 2019-01-13 (×7): qty 2

## 2019-01-13 MED ORDER — CHLORHEXIDINE GLUCONATE CLOTH 2 % EX PADS
6.0000 | MEDICATED_PAD | Freq: Every day | CUTANEOUS | Status: DC
  Administered 2019-01-14 – 2019-01-15 (×2): 6 via TOPICAL

## 2019-01-13 MED ORDER — PIPERACILLIN-TAZOBACTAM 3.375 G IVPB
3.3750 g | Freq: Three times a day (TID) | INTRAVENOUS | Status: DC
  Administered 2019-01-13 – 2019-01-16 (×8): 3.375 g via INTRAVENOUS
  Filled 2019-01-13 (×9): qty 50

## 2019-01-13 MED ORDER — ORAL CARE MOUTH RINSE
15.0000 mL | Freq: Two times a day (BID) | OROMUCOSAL | Status: DC
  Administered 2019-01-14 – 2019-01-15 (×3): 15 mL via OROMUCOSAL

## 2019-01-13 MED ORDER — GABAPENTIN 600 MG PO TABS
600.0000 mg | ORAL_TABLET | Freq: Two times a day (BID) | ORAL | Status: DC
  Administered 2019-01-13 – 2019-01-15 (×5): 600 mg via ORAL
  Filled 2019-01-13 (×5): qty 1

## 2019-01-13 MED ORDER — ACETAMINOPHEN 325 MG PO TABS
650.0000 mg | ORAL_TABLET | Freq: Four times a day (QID) | ORAL | Status: DC | PRN
  Administered 2019-01-14 – 2019-01-15 (×3): 650 mg via ORAL
  Filled 2019-01-13 (×4): qty 2

## 2019-01-13 MED ORDER — ENOXAPARIN SODIUM 40 MG/0.4ML ~~LOC~~ SOLN: 40.0000 mg | SUBCUTANEOUS | Status: DC

## 2019-01-13 MED ORDER — VANCOMYCIN HCL 10 G IV SOLR
1250.0000 mg | Freq: Two times a day (BID) | INTRAVENOUS | Status: DC
  Administered 2019-01-14: 1250 mg via INTRAVENOUS
  Filled 2019-01-13 (×2): qty 1250

## 2019-01-13 NOTE — H&P (Addendum)
NAME:  James Byrd, MRN:  [id number], DOB:  October 09, 1965, LOS: 0 ADMISSION DATE:  01/13/2019, CONSULTATION DATE:  01/13/2019 REFERRING MD:  Barrie Folk, CHIEF COMPLAINT:  SOB, N/V and fever   Brief History   53 year old male with history of melanoma s/p chemo and radiation 3 weeks prior that started on his big toe.  Patient woke up on the day of admission with N/V and fever.  Denied hematemesis or diarrhea or cough or SOB but did report fever and rigors.  Patient was taken to [facility] where he was diagnosed with pneumonia and septic shock then patient was transferred to [facility] for additional ICU care under PCCM service.  History of present illness   53 year old male with history of melanoma s/p chemo and radiation 3 weeks prior that started on his big toe.  Patient woke up on the day of admission with N/V and fever.  Denied hematemesis or diarrhea or cough or SOB but did report fever and rigors.  Patient was taken to [place] [facility] where he was diagnosed with pneumonia and septic shock then patient was transferred to [facility] for additional ICU care under PCCM service.  Past Medical History  Melanoma unknown stage HTN  Significant Hospital Events   7/26 admission to [facility]  Consults:  PCCM  Procedures:  R IJ TLC from [place] [facility] 7/26>>>  Significant Diagnostic Tests:  CXR with bibasilar infiltrate  Micro Data:  Blood 7/26>>> Urine 7/26>>>  Antimicrobials:  Vanc 7/26>>> Zosyn 7/26>>>   Interim history/subjective:  Feels better since AM  Objective   There were no vitals taken for this visit.   HR 89, BP 93/58 on Levophed, RR 20 and saturation of 96 on 2L [place]    No intake or output data in the 24 hours ending 01/13/19 1830 There were no vitals filed for this visit.  Examination: General: Cutely ill appearing male, NAD HENT: [place]/AT, PERRL, EOM-I and MMM Lungs: Bibasilar crackles Cardiovascular: RRR, Nl S1/S2 and -M/R/G Abdomen: Soft, NT, ND and +BS Extremities:  -edema and -tenderness, right big toe amputation Neuro: Alert and interactive, moving all ext to command Skin: Intact  Resolved Hospital Problem list   N/A  Assessment & Plan:  53 year old immune compromised patient due to chemo for melanoma who presents with bibasilar infiltrate and septic shock.  Septic shock:  - Levophed  - IVF resuscitation via LR  HCAP:  - Pan culture (follow [place]'s cultures since patient already received abx)  - Vanc  - Zosyn  - Repeat CXR here for evaluation as we only have results  Hypoxemia:  - Titrate O2 for sat of 88-92%  Hypokalemia:  - Replace  - Recheck in AM  Fever:  - Tylenol  - Treat infection  Chronic neuropathic pain:  - Gabapentin 600 mg BID  Melanoma s/p chemo, immune suppressed and chronically on steroids  - Stress dose steroids  Discussed with Dr. Halford Chessman  Labs   CBC: No results for input(s): WBC, NEUTROABS, HGB, HCT, MCV, PLT in the last 168 hours.  Basic Metabolic Panel: No results for input(s): NA, K, CL, CO2, GLUCOSE, BUN, CREATININE, CALCIUM, MG, PHOS in the last 168 hours. GFR: Estimated Creatinine Clearance: 113.8 mL/min (by C-G formula based on SCr of 0.97 mg/dL). No results for input(s): PROCALCITON, WBC, LATICACIDVEN in the last 168 hours.  Liver Function Tests: No results for input(s): AST, ALT, ALKPHOS, BILITOT, PROT, ALBUMIN in the last 168 hours. No results for input(s): LIPASE, AMYLASE  in the last 168 hours. No results for input(s): AMMONIA in the last 168 hours.  ABG    Component Value Date/Time   PHART 7.438 03/11/2015 0500   PCO2ART 39.0 03/11/2015 0500   PO2ART 78.8 (L) 03/11/2015 0500   HCO3 25.9 (H) 03/11/2015 0500   TCO2 27.1 03/11/2015 0500   O2SAT 95.7 03/11/2015 0500     Coagulation Profile: No results for input(s): INR, PROTIME in the last 168 hours.  Cardiac Enzymes: No results for input(s): CKTOTAL, CKMB, CKMBINDEX, TROPONINI in the last 168 hours.  HbA1C: No results found for:  HGBA1C  CBG: Recent Labs  Lab 01/13/19 1812  GLUCAP 86    Review of Systems:   12 point ROS is negative other than above  Past Medical History  He,  has a past medical history of Adrenal insufficiency ([place]), Anxiety (46/02/6294), Complication of anesthesia, Goals of care, counseling/discussion (12/17/2018), Gout, History of kidney stones, HLD (hyperlipidemia), Malignant melanoma of right great toe ([place]) (11/14/2014), and Paroxysmal supraventricular tachycardia ([place]) (05/24/2012).   Surgical History    Past Surgical History:  Procedure Laterality Date  . ABLATION  8 YRS AGO  . CHOLECYSTECTOMY    . EXTRACORPOREAL SHOCK WAVE LITHOTRIPSY  5 YRS AGO  . LYMPH NODE BIOPSY Right 11/06/2018   Procedure: RIGHT INGUINAL LYMPH NODE BIOPSY AND POSSIBLE RIGHT GROIN NODE DISECTION;  Surgeon: Alphonsa Overall, MD;  Location: [facility] ORS;  Service: General;  Laterality: Right;  . r toe partial ambutation    . ROTATOR CUFF REPAIR Right 2010   3 yrs ago     Social History   reports that he has never smoked. He has never used smokeless tobacco. He reports that he does not drink alcohol or use drugs.   Family History   His family history includes COPD in his father; Diabetes in his brother, father, and sister; Stroke in his mother. He was adopted.   Allergies No Known Allergies   Home Medications  Prior to Admission medications   Medication Sig Start Date End Date Taking? Authorizing Provider  Ascorbic Acid (VITAMIN C) 1000 MG tablet Take 1,000 mg by mouth daily.    [provider]  aspirin-sod bicarb-citric acid (ALKA-SELTZER) 325 MG TBEF tablet Take 650 mg by mouth daily as needed (indigestion).    [provider]  augmented betamethasone dipropionate (DIPROLENE-AF) 0.05 % cream Apply 1 application topically 2 (two) times daily as needed (psoriasis).  09/29/15   [provider]  Calcium Carb-Cholecalciferol (CALCIUM 600 + D PO) Take 1 tablet by mouth at bedtime.    [provider]  Cholecalciferol (VITAMIN D) 2000 units tablet Take 2,000 Units by mouth daily.    [provider]  diclofenac sodium (VOLTAREN) 1 % GEL  10/23/18   [provider]  fludrocortisone (FLORINEF) 0.1 MG tablet Take 0.1 mg by mouth daily.    [provider]  gabapentin (NEURONTIN) 600 MG tablet Take 600 mg by mouth 2 (two) times daily.    [provider]  hydrocortisone (CORTEF) 5 MG tablet Take 5 mg by mouth 2 (two) times daily.    [provider]  Multiple Vitamin (MULTIVITAMIN) tablet Take 1 tablet by mouth daily.    [provider]  SUPER B COMPLEX/C PO Take 1 tablet by mouth daily.    [provider]  tadalafil (CIALIS) 10 MG tablet Take 1 tablet 30 min prior to sexual activity 10/17/18   [name], MD  testosterone (ANDROGEL) 50 MG/5GM (1%)  GEL Place 5 g onto the skin daily.    [provider]    The patient is critically ill with multiple organ systems failure and requires high complexity decision making for assessment and support, frequent evaluation and titration of therapies, application of advanced monitoring technologies and extensive interpretation of multiple databases.   Critical Care Time devoted to patient care services described in this note is  45  Minutes. This time reflects time of care of this signee Dr Jennet Maduro. This critical care time does not reflect procedure time, or teaching time or supervisory time of PA/NP/Med student/Med Resident etc but could involve care discussion time.  Rush Farmer, M.D. [facility] Pulmonary/Critical Care Medicine. Pager: [phone]. After hours pager: [phone].

## 2019-01-13 NOTE — Progress Notes (Signed)
eLink Physician-Brief Progress Note Patient Name: James Byrd DOB: 1965-12-31 MRN: [id number]   Date of Service  01/13/2019  HPI/Events of Note  Inquiry if labs and CXR need to be repeated tonight  eICU Interventions  As per Dr Pura Spice note, CXR to be repeated as we dont have access to image but labs can be repeated in the morning     Intervention Category Major Interventions: Other:  Judd Lien 01/13/2019, 9:37 PM

## 2019-01-13 NOTE — Progress Notes (Signed)
Pharmacy Antibiotic Note  James Byrd is a 53 y.o. male admitted on 01/13/2019 with pneumonia.  Pharmacy has been consulted for vancomycin and zosyn dosing. Zosyn x1 and vanc 2g x1 given at [facility] before transfer.  SCr 1.2 from [facility]: Vancomycin 1250mg  IV every 12 hours (calc AUC 455, SCr used 1.2) Zosyn 3.375g IV every 8 hours Monitor renal function, MRSA PCR, clinical progression to narrow Vancomycin levels at steady state    No data recorded.  No results for input(s): WBC, CREATININE, LATICACIDVEN, VANCOTROUGH, VANCOPEAK, VANCORANDOM, GENTTROUGH, GENTPEAK, GENTRANDOM, TOBRATROUGH, TOBRAPEAK, TOBRARND, AMIKACINPEAK, AMIKACINTROU, AMIKACIN in the last 168 hours.  Estimated Creatinine Clearance: 113.8 mL/min (by C-G formula based on SCr of 0.97 mg/dL).    No Known Allergies  Antimicrobials this admission: Vanc 7/26>> Zosyn 7/26>>  Dose adjustments this admission: n/a  Microbiology results: MRSA PCR pend  Bertis Ruddy, PharmD Clinical Pharmacist Please check AMION for all [place] numbers 01/13/2019 6:46 PM

## 2019-01-13 NOTE — Progress Notes (Signed)
PT came in via Care link from [place] at [facility] BP 93/68 MAP. Lungs are diminished upon ausculation. He has a right IJ CVC; levo going at 6. He has a right great toe amputation, and he informed me it was amputated due to malignant melanoma and currently getting radiation treatment for that because it has moved to his left femoral lymph nodes. I have set PT up in the room. CCM doctor has evaluated him and is currently putting orders in for him.

## 2019-01-14 ENCOUNTER — Telehealth: Payer: Self-pay | Admitting: *Deleted

## 2019-01-14 ENCOUNTER — Ambulatory Visit: Payer: PPO

## 2019-01-14 DIAGNOSIS — E876 Hypokalemia: Secondary | ICD-10-CM

## 2019-01-14 LAB — COMPREHENSIVE METABOLIC PANEL
ALT: 31 U/L (ref 0–44)
AST: 32 U/L (ref 15–41)
Albumin: 3 g/dL — ABNORMAL LOW (ref 3.5–5.0)
Alkaline Phosphatase: 41 U/L (ref 38–126)
Anion gap: 9 (ref 5–15)
BUN: 19 mg/dL (ref 6–20)
CO2: 22 mmol/L (ref 22–32)
Calcium: 7.9 mg/dL — ABNORMAL LOW (ref 8.9–10.3)
Chloride: 110 mmol/L (ref 98–111)
Creatinine, Ser: 1.54 mg/dL — ABNORMAL HIGH (ref 0.61–1.24)
GFR calc Af Amer: 59 mL/min — ABNORMAL LOW (ref >60)
GFR calc non Af Amer: 51 mL/min — ABNORMAL LOW (ref >60)
Glucose, Bld: 123 mg/dL — ABNORMAL HIGH (ref 70–99)
Potassium: 4.4 mmol/L (ref 3.5–5.1)
Sodium: 141 mmol/L (ref 135–145)
Total Bilirubin: 1.6 mg/dL — ABNORMAL HIGH (ref 0.3–1.2)
Total Protein: 5.5 g/dL — ABNORMAL LOW (ref 6.5–8.1)

## 2019-01-14 LAB — CBC
HCT: 38.3 % — ABNORMAL LOW (ref 39.0–52.0)
Hemoglobin: 13.1 g/dL (ref 13.0–17.0)
MCH: 30.3 pg (ref 26.0–34.0)
MCHC: 34.2 g/dL (ref 30.0–36.0)
MCV: 88.7 fL (ref 80.0–100.0)
Platelets: 151 10*3/uL (ref 150–400)
RBC: 4.32 MIL/uL (ref 4.22–5.81)
RDW: 13.2 % (ref 11.5–15.5)
WBC: 8.8 10*3/uL (ref 4.0–10.5)
nRBC: 0 % (ref 0.0–0.2)

## 2019-01-14 LAB — GLUCOSE, CAPILLARY: Glucose-Capillary: 122 mg/dL — ABNORMAL HIGH (ref 70–99)

## 2019-01-14 MED ORDER — POTASSIUM PHOSPHATES 15 MMOLE/5ML IV SOLN: 30.0000 mmol | Freq: Once | INTRAVENOUS | Status: DC

## 2019-01-14 MED ORDER — MAGNESIUM SULFATE 2 GM/50ML IV SOLN
2.0000 g | Freq: Once | INTRAVENOUS | Status: AC
  Administered 2019-01-14: 14:00:00 2 g via INTRAVENOUS
  Filled 2019-01-14: qty 50

## 2019-01-14 NOTE — Telephone Encounter (Signed)
Message received from patient's wife to inform Dr. Marin Olp that patient has been admitted to [facility] with bilateral pneumonia.  Dr. Marin Olp notified.

## 2019-01-14 NOTE — Progress Notes (Signed)
NAME:  James Byrd, MRN:  [id number], DOB:  1965/08/28, LOS: 1 ADMISSION DATE:  01/13/2019, CONSULTATION DATE:  01/13/2019 REFERRING MD:  Barrie Folk, CHIEF COMPLAINT:  SOB, N/V and fever   Brief History   53 year old male with history of melanoma s/p chemo and radiation 3 weeks prior that started on his big toe.  Patient woke up on the day of admission with N/V and fever.  Denied hematemesis or diarrhea or cough or SOB but did report fever and rigors.  Patient was taken to [place] hospital where he was diagnosed with pneumonia and septic shock then patient was transferred to [facility] for additional ICU care under PCCM service.  History of present illness   53 year old male with history of melanoma s/p chemo and radiation 3 weeks prior that started on his big toe.  Patient woke up on the day of admission with N/V and fever.  Denied hematemesis or diarrhea or cough or SOB but did report fever and rigors.  Patient was taken to [place] hospital where he was diagnosed with pneumonia and septic shock then patient was transferred to [facility] for additional ICU care under PCCM service.  Past Medical History  Melanoma unknown stage HTN  Significant Hospital Events   7/26 admission to [facility]  Consults:  PCCM  Procedures:  R IJ TLC from [place] ER 7/26>>>  Significant Diagnostic Tests:  CXR with bibasilar infiltrate  Micro Data:  Blood 7/26>>> Urine 7/26>>>  Antimicrobials:  Vanc 7/26>>> Zosyn 7/26>>>   Interim history/subjective:  Feels better Low dose pressors  Objective   Blood pressure (!) 102/58, pulse 67, temperature 98.5 F (36.9 C), temperature source Oral, resp. rate 19, SpO2 92 %.   HR 89, BP 93/58 on Levophed, RR 20 and saturation of 96 on 2L [place]     Intake/Output Summary (Last 24 hours) at 01/14/2019 1230 Last data filed at 01/14/2019 1000 Gross per 24 hour  Intake 1768.34 ml  Output 1250 ml  Net 518.34 ml   There were no vitals filed for this visit.   Examination: General: Appears tired but not as acutely ill appearing HENT: /AT, PERRL, EOM-I and MMM Lungs: Bibasilar crackles Cardiovascular: RRR, NL S1/S2 and -M/R/G Abdomen: Soft, NT, ND and +BS Extremities: -edema and -tenderness Neuro: Awake and interactive, moving all ext to command Skin: Intact  Resolved Hospital Problem list   N/A  Assessment & Plan:  53 year old immune compromised patient due to chemo for melanoma who presents with bibasilar infiltrate and septic shock.  Septic shock:  - Attempt to wean levophed to off  - Continue IVF resuscitation  HCAP:  - Pan culture (follow Vera Cruz's cultures since patient already received abx)  - D/C Vanc, MRSA negative  - Continue Zosyn  - Repeat CXR here for evaluation as we only have results  Hypoxemia:  - Titrate O2 for sat of 88-92%  Hypokalemia: resolved  - Recheck in AM  Fever:  - Tylenol  - Treat infection  Chronic neuropathic pain:  - Gabapentin 600 mg BID  Melanoma s/p chemo, immune suppressed and chronically on steroids  - Stress dose steroids  Will hold in the ICU for a few more hours, if able to remove pressors will transfer out of the ICU and to [facility] service with PCCM off.  Discussed with PCCM-NP.  Labs   CBC: Recent Labs  Lab 01/14/19 0410  WBC 8.8  HGB 13.1  HCT 38.3*  MCV 88.7  PLT 151  Basic Metabolic Panel: Recent Labs  Lab 01/14/19 0410  NA 141  K 4.4  CL 110  CO2 22  GLUCOSE 123*  BUN 19  CREATININE 1.54*  CALCIUM 7.9*   GFR: Estimated Creatinine Clearance: 71.7 mL/min (A) (by C-G formula based on SCr of 1.54 mg/dL (H)). Recent Labs  Lab 01/14/19 0410  WBC 8.8    Liver Function Tests: Recent Labs  Lab 01/14/19 0410  AST 32  ALT 31  ALKPHOS 41  BILITOT 1.6*  PROT 5.5*  ALBUMIN 3.0*   No results for input(s): LIPASE, AMYLASE in the last 168 hours. No results for input(s): AMMONIA in the last 168 hours.  ABG    Component Value Date/Time   PHART 7.438  03/11/2015 0500   PCO2ART 39.0 03/11/2015 0500   PO2ART 78.8 (L) 03/11/2015 0500   HCO3 25.9 (H) 03/11/2015 0500   TCO2 27.1 03/11/2015 0500   O2SAT 95.7 03/11/2015 0500     Coagulation Profile: No results for input(s): INR, PROTIME in the last 168 hours.  Cardiac Enzymes: No results for input(s): CKTOTAL, CKMB, CKMBINDEX, TROPONINI in the last 168 hours.  HbA1C: No results found for: HGBA1C  CBG: Recent Labs  Lab 01/13/19 1812 01/14/19 1145  GLUCAP 86 122*   The patient is critically ill with multiple organ systems failure and requires high complexity decision making for assessment and support, frequent evaluation and titration of therapies, application of advanced monitoring technologies and extensive interpretation of multiple databases.   Critical Care Time devoted to patient care services described in this note is  32  Minutes. This time reflects time of care of this signee Dr Jennet Maduro. This critical care time does not reflect procedure time, or teaching time or supervisory time of PA/NP/Med student/Med Resident etc but could involve care discussion time.  Rush Farmer, M.D. [facility] Pulmonary/Critical Care Medicine. Pager: [phone]. After hours pager: [phone].

## 2019-01-15 ENCOUNTER — Ambulatory Visit: Payer: PPO

## 2019-01-15 ENCOUNTER — Other Ambulatory Visit: Payer: Self-pay

## 2019-01-15 ENCOUNTER — Encounter (HOSPITAL_COMMUNITY): Payer: Self-pay

## 2019-01-15 ENCOUNTER — Inpatient Hospital Stay (HOSPITAL_COMMUNITY): Payer: PPO

## 2019-01-15 DIAGNOSIS — N179 Acute kidney failure, unspecified: Secondary | ICD-10-CM | POA: Diagnosis present

## 2019-01-15 DIAGNOSIS — A419 Sepsis, unspecified organism: Secondary | ICD-10-CM | POA: Diagnosis present

## 2019-01-15 DIAGNOSIS — C4371 Malignant melanoma of right lower limb, including hip: Secondary | ICD-10-CM

## 2019-01-15 DIAGNOSIS — C774 Secondary and unspecified malignant neoplasm of inguinal and lower limb lymph nodes: Secondary | ICD-10-CM

## 2019-01-15 DIAGNOSIS — R6521 Severe sepsis with septic shock: Secondary | ICD-10-CM | POA: Diagnosis present

## 2019-01-15 DIAGNOSIS — J9601 Acute respiratory failure with hypoxia: Secondary | ICD-10-CM | POA: Diagnosis present

## 2019-01-15 LAB — BASIC METABOLIC PANEL
Anion gap: 8 (ref 5–15)
BUN: 18 mg/dL (ref 6–20)
CO2: 22 mmol/L (ref 22–32)
Calcium: 7.9 mg/dL — ABNORMAL LOW (ref 8.9–10.3)
Chloride: 108 mmol/L (ref 98–111)
Creatinine, Ser: 1.47 mg/dL — ABNORMAL HIGH (ref 0.61–1.24)
GFR calc Af Amer: >60 mL/min (ref >60)
GFR calc non Af Amer: 54 mL/min — ABNORMAL LOW (ref >60)
Glucose, Bld: 106 mg/dL — ABNORMAL HIGH (ref 70–99)
Potassium: 3.8 mmol/L (ref 3.5–5.1)
Sodium: 138 mmol/L (ref 135–145)

## 2019-01-15 LAB — TSH: TSH: 0.924 u[IU]/mL (ref 0.350–4.500)

## 2019-01-15 LAB — CBC
HCT: 32.7 % — ABNORMAL LOW (ref 39.0–52.0)
Hemoglobin: 11 g/dL — ABNORMAL LOW (ref 13.0–17.0)
MCH: 30.4 pg (ref 26.0–34.0)
MCHC: 33.6 g/dL (ref 30.0–36.0)
MCV: 90.3 fL (ref 80.0–100.0)
Platelets: 115 10*3/uL — ABNORMAL LOW (ref 150–400)
RBC: 3.62 MIL/uL — ABNORMAL LOW (ref 4.22–5.81)
RDW: 13.6 % (ref 11.5–15.5)
WBC: 5.5 10*3/uL (ref 4.0–10.5)
nRBC: 0 % (ref 0.0–0.2)

## 2019-01-15 LAB — PHOSPHORUS: Phosphorus: 2.4 mg/dL — ABNORMAL LOW (ref 2.5–4.6)

## 2019-01-15 LAB — MAGNESIUM: Magnesium: 2.1 mg/dL (ref 1.7–2.4)

## 2019-01-15 MED ORDER — POTASSIUM & SODIUM PHOSPHATES 280-160-250 MG PO PACK
1.0000 | PACK | Freq: Three times a day (TID) | ORAL | Status: DC
  Administered 2019-01-15 – 2019-01-16 (×4): 1 via ORAL
  Filled 2019-01-15 (×6): qty 1

## 2019-01-15 NOTE — Progress Notes (Addendum)
PROGRESS NOTE                                                                                                                                                                                                             Patient Demographics:    James Byrd, is a 53 y.o. male, DOB - 1965-11-10, NKN:[id number]  Admit date - 01/13/2019   Admitting Physician Chesley Mires, MD  Outpatient Primary MD for the patient is Street, Sharon Mt, MD  LOS - 2  Outpatient Specialists:Dr Ennever  No chief complaint on file.      Brief Narrative   53 year old male with history of melanoma which started in his big toe status post chemotherapy and radiation 3 weeks back presented with nausea, vomiting and fever on the day of admission.  He was brought to [facility] where he found to have pneumonia with septic shock and was this transferred to [facility], ICU. Patient had right IJ TLC placed at [facility] ED.  Chest x-ray showed bibasilar infiltrate.  Blood and urine culture sent and placed on empiric vancomycin and Zosyn. He required IV pressors for hypotension with septic shock. Patient weaned off pressors and transferred to hospitalist service on 7/28.   Subjective:   Patient reports he starting to feel better without any fevers, chills, nausea or vomiting.  Feels tired.  Has not been out of bed.  No overnight events.  Sats at 90% on room air.   Assessment  & Plan :    Active Problems:   Septic shock ([place]) Secondary to healthcare associated pneumonia.  Sepsis resolved and clinically improving.  Sats of 90% on room air, likely desaturates on ambulation.  Continue empiric IV Zosyn.  Discontinued vancomycin.  Discontinue stress dose steroid.  Blood cultures negative for growth. Ambulate patient.  Discontinue central line.  Active problems Acute respiratory failure with hypoxia (H[facility]) Secondary to pneumonia.  Sats low 90s on room  air.  Ambulate and monitor.  Acute kidney injury ([place]) Likely due to septic shock and dehydration.  Resume IV fluids and monitor.  Avoid nephrotoxins.  Recheck labs in a.m.  Hypokalemia Replenished  Melanoma, stage III disease On chemotherapy with nivolumab.  Seen by his oncologist while in the hospital.  TSH normal.  Follow-up with oncologist as outpatient.  Chronic neuropathy pain Continue Neurontin.  Hypophosphatemia Added supplement.  Code Status : Full code  Family Communication  : None  Disposition Plan  : Home tomorrow if renal function improved, remains afebrile and improved saturation.  Barriers For Discharge : Improving symptoms  Consults  : PCCM/oncology  Procedures  : Right IJ  DVT Prophylaxis  :  Lovenox -   Lab Results  Component Value Date   PLT 115 (L) 01/15/2019    Antibiotics  :    Anti-infectives (From admission, onward)   Start     Dose/Rate Route Frequency Ordered Stop   01/14/19 0200  vancomycin (VANCOCIN) 1,250 mg in sodium chloride 0.9 % 250 mL IVPB  Status:  Discontinued     1,250 mg 166.7 mL/hr over 90 Minutes Intravenous Every 12 hours 01/13/19 2128 01/14/19 1151   01/13/19 2200  piperacillin-tazobactam (ZOSYN) IVPB 3.375 g     3.375 g 12.5 mL/hr over 240 Minutes Intravenous Every 8 hours 01/13/19 1847     01/13/19 1900  vancomycin (VANCOCIN) 1,500 mg in sodium chloride 0.9 % 500 mL IVPB  Status:  Discontinued     1,500 mg 250 mL/hr over 120 Minutes Intravenous  Once 01/13/19 1849 01/13/19 1951        Objective:   Vitals:   01/15/19 0127 01/15/19 0405 01/15/19 0430 01/15/19 0718  BP: 96/66 93/61  103/77  Pulse: 71 77 72 65  Resp: (!) 21 (!) 21 19 17   Temp: 98.8 F (37.1 C) 98.4 F (36.9 C)  98.5 F (36.9 C)  TempSrc: Oral Oral  Oral  SpO2: 97% 92% 94% 96%  Weight: 113 kg       Wt Readings from Last 3 Encounters:  01/15/19 113 kg  01/03/19 109.3 kg  12/19/18 109 kg     Intake/Output Summary (Last 24 hours)  at 01/15/2019 1305 Last data filed at 01/15/2019 1051 Gross per 24 hour  Intake 2479.02 ml  Output 1150 ml  Net 1329.02 ml     Physical Exam  Gen: not in distress, fatigued HEENT: no pallor, moist mucosa, supple neck, right IJ Chest: clear b/l, no added sounds CVS: N S1&S2, no murmurs,  GI: soft, NT, ND,  Musculoskeletal: warm, no edema     Data Review:    CBC Recent Labs  Lab 01/14/19 0410 01/15/19 0432  WBC 8.8 5.5  HGB 13.1 11.0*  HCT 38.3* 32.7*  PLT 151 115*  MCV 88.7 90.3  MCH 30.3 30.4  MCHC 34.2 33.6  RDW 13.2 13.6    Chemistries  Recent Labs  Lab 01/14/19 0410 01/15/19 0432  NA 141 138  K 4.4 3.8  CL 110 108  CO2 22 22  GLUCOSE 123* 106*  BUN 19 18  CREATININE 1.54* 1.47*  CALCIUM 7.9* 7.9*  MG  --  2.1  AST 32  --   ALT 31  --   ALKPHOS 41  --   BILITOT 1.6*  --    ------------------------------------------------------------------------------------------------------------------ No results for input(s): CHOL, HDL, LDLCALC, TRIG, CHOLHDL, LDLDIRECT in the last 72 hours.  No results found for: HGBA1C ------------------------------------------------------------------------------------------------------------------ Recent Labs    01/15/19 0800  TSH 0.924   ------------------------------------------------------------------------------------------------------------------ No results for input(s): VITAMINB12, FOLATE, FERRITIN, TIBC, IRON, RETICCTPCT in the last 72 hours.  Coagulation profile No results for input(s): INR, PROTIME in the last 168 hours.  No results for input(s): DDIMER in the last 72 hours.  Cardiac Enzymes No results for input(s): CKMB, TROPONINI, MYOGLOBIN in the last 168 hours.  Invalid input(s): CK ------------------------------------------------------------------------------------------------------------------    Component  Value Date/Time   BNP 9.3 02/11/2015 0830    Inpatient Medications  Scheduled Meds: .  Chlorhexidine Gluconate Cloth  6 each Topical Q0600  . enoxaparin (LOVENOX) injection  40 mg Subcutaneous Q24H  . gabapentin  600 mg Oral BID  . hydrocortisone sod succinate (SOLU-CORTEF) inj  50 mg Intravenous Q6H  . mouth rinse  15 mL Mouth Rinse BID  . potassium & sodium phosphates  1 packet Oral TID WC & HS   Continuous Infusions: . lactated ringers 100 mL/hr at 01/15/19 1053  . piperacillin-tazobactam (ZOSYN)  IV 12.5 mL/hr at 01/15/19 0659   PRN Meds:.acetaminophen, ondansetron  Micro Results Recent Results (from the past 240 hour(s))  MRSA PCR Screening     Status: None   Collection Time: 01/13/19  6:14 PM   Specimen: Nasal Mucosa; Nasopharyngeal  Result Value Ref Range Status   MRSA by PCR NEGATIVE NEGATIVE Final    Comment:        The GeneXpert MRSA Assay (FDA approved for NASAL specimens only), is one component of a comprehensive MRSA colonization surveillance program. It is not intended to diagnose MRSA infection nor to guide or monitor treatment for MRSA infections. Performed at [facility], [place] [address], [place], [place] [address]     Radiology Reports Dg Chest 2 View  Result Date: 01/15/2019 CLINICAL DATA:  Pneumonia EXAM: CHEST - 2 VIEW COMPARISON:  01/13/2019 FINDINGS: No significant change in right basilar, band or platelike atelectasis or scarring. No other airspace opacities. Right neck vascular catheter. IMPRESSION: No significant change in right basilar, band or platelike atelectasis or scarring. No other airspace opacities. Right neck vascular catheter. Electronically Signed   By: Eddie Candle M.D.   On: 01/15/2019 12:51   Dg Chest Port 1 View  Result Date: 01/13/2019 CLINICAL DATA:  Fever EXAM: PORTABLE CHEST 1 VIEW COMPARISON:  PET CT 10/30/2018, radiograph 03/12/2015 FINDINGS: Subsegmental atelectasis or scarring at the right base. Streaky atelectasis or scar at the left infrahilar lung. No focal consolidation. Normal heart size. No  pneumothorax. Catheter projects over right neck with tip positioned over the distal jugular vein. IMPRESSION: Subsegmental atelectasis or scarring at both lung bases. Electronically Signed   By: Donavan Foil M.D.   On: 01/13/2019 23:18    Time Spent in minutes  35   James Byrd M.D on 01/15/2019 at 1:05 PM  Between 7am to 7pm - Pager - [phone]  After 7pm go to [email or web address] - password [facility]  [facility] -  Office  [phone]

## 2019-01-15 NOTE — Consult Note (Signed)
Referral MD  Reason for Referral: Septic shock; recurrent melanoma to the right inguinal area  No chief complaint on file. : I am not sure what happened.  HPI: Mr. Ala is well-known to me.  He is a 53 year old white male.  He has a past history of melanoma of the great toe of the right foot.  He had stage III disease by virtue of a positive lymph node in the inguinal area.  He underwent adjuvant immunotherapy with Yervoy.  This was about 3 to 4 years ago.  He had an awful time with this.  He had severe side effects.  He still has permanent neurological issues.  He subsequently was found to have recurrence in the right inguinal area.  He underwent a right inguinal lymphadenectomy.  He then was started on immunotherapy with Opdivo.  This is a different category of agent then Yervoy.  It is not chemotherapy.  He is not immunocompromised.  He had his first treatment a couple weeks ago.  Over the weekend he was doing well.  He was quite active on Saturday.  He went to see family.  He has a brother who has cancer is not doing well.  He mowed his mother's yard.  He felt well until Sunday when he began to have chills all of a sudden.  He ultimately ended up in the emergency room done at [facility].  It was felt he had pneumonia.  He had problems with hypotension.  He was subsequently transferred up to [facility].  He was put in the ICU.  He was placed on pressors.  His blood pressure improved nicely.  His labs have not looked all that bad.  Today, his sodium is 138.  Potassium 3.8.  BUN 18 creatinine 1.47.  Calcium is 7.9.  Yesterday, his liver function tests were normal.  His CBC yesterday showed a white cell count of 8.8.  Hemoglobin 13.1.  Platelet count 151,000.  A chest x-ray done yesterday did not show any obvious pneumonia.  Cultures were all negative.  He is on antibiotics.  We have had a lot of vomiting when he got sick.  He did not have diarrhea.  He thought maybe he might have had  food poisoning.  He looks quite good right now.  He is on supplemental oxygen.  He took the oxygen off and his O2 saturation was in the low 90s.  He is not coughing up anything.  There is no hemoptysis.  Very had some diarrhea yesterday.  He has had no rashes.  He has had no headache.  He was moved out of the ICU yesterday.   Past Medical History:  Diagnosis Date  . Adrenal insufficiency ([place])   . Anxiety 05/24/2012  . Complication of anesthesia    SLO TO AWAKEN MANY 8 TO 9 YRS AGO, NO ISSUES SINCE  . Goals of care, counseling/discussion 12/17/2018  . Gout   . History of kidney stones   . HLD (hyperlipidemia)   . Malignant melanoma of right great toe ([place]) 11/14/2014  . Paroxysmal supraventricular tachycardia ([place]) 05/24/2012   Described in the treadmill report; details are pending   :  Past Surgical History:  Procedure Laterality Date  . ABLATION  8 YRS AGO  . CHOLECYSTECTOMY    . EXTRACORPOREAL SHOCK WAVE LITHOTRIPSY  5 YRS AGO  . LYMPH NODE BIOPSY Right 11/06/2018   Procedure: RIGHT INGUINAL LYMPH NODE BIOPSY AND POSSIBLE RIGHT GROIN NODE DISECTION;  Surgeon: Alphonsa Overall, MD;  Location: [facility] ORS;  Service: General;  Laterality: Right;  . r toe partial ambutation    . ROTATOR CUFF REPAIR Right 2010   3 yrs ago  :   Current Facility-Administered Medications:  .  acetaminophen (TYLENOL) tablet 650 mg, 650 mg, Oral, Q6H PRN, Chesley Mires, MD, 650 mg at 01/14/19 1635 .  Chlorhexidine Gluconate Cloth 2 % PADS 6 each, 6 each, Topical, Q0600, Chesley Mires, MD, 6 each at 01/15/19 0602 .  enoxaparin (LOVENOX) injection 40 mg, 40 mg, Subcutaneous, Q24H, Rush Farmer, MD, 40 mg at 01/14/19 2128 .  gabapentin (NEURONTIN) tablet 600 mg, 600 mg, Oral, BID, Sood, Vineet, MD, 600 mg at 01/14/19 2128 .  hydrocortisone sodium succinate (SOLU-CORTEF) 100 MG injection 50 mg, 50 mg, Intravenous, Q6H, Sood, Vineet, MD, 50 mg at 01/15/19 0601 .  lactated ringers infusion, , Intravenous,  Continuous, Sood, Vineet, MD, Last Rate: 75 mL/hr at 01/15/19 0659 .  MEDLINE mouth rinse, 15 mL, Mouth Rinse, BID, Chesley Mires, MD, 15 mL at 01/14/19 2131 .  ondansetron (ZOFRAN) injection 4 mg, 4 mg, Intravenous, Q6H PRN, Chesley Mires, MD, 4 mg at 01/14/19 1635 .  piperacillin-tazobactam (ZOSYN) IVPB 3.375 g, 3.375 g, Intravenous, Q8H, Bertis Ruddy, RPH, Last Rate: 12.5 mL/hr at 01/15/19 3664  Facility-Administered Medications Ordered in Other Encounters:  .  0.9 %  sodium chloride infusion, , Intravenous, Continuous, Shalynn Jorstad, Rudell Cobb, MD, Last Rate: 500 mL/hr at 02/05/15 1510:  . Chlorhexidine Gluconate Cloth  6 each Topical Q0600  . enoxaparin (LOVENOX) injection  40 mg Subcutaneous Q24H  . gabapentin  600 mg Oral BID  . hydrocortisone sod succinate (SOLU-CORTEF) inj  50 mg Intravenous Q6H  . mouth rinse  15 mL Mouth Rinse BID  :  No Known Allergies:  Family History  Adopted: Yes  Problem Relation Age of Onset  . Stroke Mother   . COPD Father   . Diabetes Father   . Diabetes Sister   . Diabetes Brother   :  Social History   Socioeconomic History  . Marital status: Married    Spouse name: Not on file  . Number of children: Not on file  . Years of education: Not on file  . Highest education level: Not on file  Occupational History  . Not on file  Social Needs  . Financial resource strain: Not on file  . Food insecurity    Worry: Not on file    Inability: Not on file  . Transportation needs    Medical: Not on file    Non-medical: Not on file  Tobacco Use  . Smoking status: Never Smoker  . Smokeless tobacco: Never Used  Substance and Sexual Activity  . Alcohol use: No    Alcohol/week: 0.0 standard drinks  . Drug use: No  . Sexual activity: Yes  Lifestyle  . Physical activity    Days per week: Not on file    Minutes per session: Not on file  . Stress: Not on file  Relationships  . Social Herbalist on phone: Not on file    Gets together: Not  on file    Attends religious service: Not on file    Active member of club or organization: Not on file    Attends meetings of clubs or organizations: Not on file    Relationship status: Not on file  . Intimate partner violence    Fear of current or ex partner: Not on file  Emotionally abused: Not on file    Physically abused: Not on file    Forced sexual activity: Not on file  Other Topics Concern  . Not on file  Social History Narrative   Lives with wife in a one story home.  Has 2 children.  On disability.  Education: high school.+  :  Pertinent items are noted in HPI.  Exam:  See above  Patient Vitals for the past 24 hrs:  BP Temp Temp src Pulse Resp SpO2 Weight  01/15/19 0430 - - - 72 19 94 % -  01/15/19 0405 93/61 98.4 F (36.9 C) Oral 77 (!) 21 92 % -  01/15/19 0127 96/66 98.8 F (37.1 C) Oral 71 (!) 21 97 % 249 lb 1.9 oz (113 kg)  01/14/19 2100 (!) 86/40 - - 81 (!) 22 91 % -  01/14/19 2000 (!) 81/39 - - 85 (!) 24 91 % -  01/14/19 1800 (!) 89/59 - - 95 (!) 23 95 % -  01/14/19 1700 103/65 - - 90 (!) 22 95 % -  01/14/19 1600 101/66 - - 94 (!) 23 96 % -  01/14/19 1500 (!) 83/54 - - 100 20 93 % -  01/14/19 1400 (!) 90/55 - - 96 20 95 % -  01/14/19 1300 98/72 - - 99 19 95 % -  01/14/19 1200 96/75 - - 89 (!) 26 91 % -  01/14/19 1146 - 98.5 F (36.9 C) Oral - - - -  01/14/19 1100 98/60 - - 100 (!) 25 90 % -  01/14/19 1000 (!) 102/58 - - 67 19 92 % -  01/14/19 0900 100/64 - - 78 18 93 % -  01/14/19 0800 110/74 98.5 F (36.9 C) Axillary (!) 54 18 93 % -     Recent Labs    01/14/19 0410  WBC 8.8  HGB 13.1  HCT 38.3*  PLT 151   Recent Labs    01/14/19 0410 01/15/19 0432  NA 141 138  K 4.4 3.8  CL 110 108  CO2 22 22  GLUCOSE 123* 106*  BUN 19 18  CREATININE 1.54* 1.47*  CALCIUM 7.9* 7.9*    Blood smear review: None  Pathology: None    Assessment and Plan: Mr. Akkerman is a 53 year old white male.  He has recurrent melanoma.  He had nivolumab 2 weeks  ago.  I am not sure at all whether or not this current episode is related to the nivolumab.  I would think that if he had "pneumonia" that this would be an interstitial type pneumonia from the nivolumab.  I certainly do not see anything like that on the chest x-ray.  If he had adrenal insufficiency, I suppose this could be from nivolumab.  He is NOT immunocompromised.  If anything, his immune system is a little overactive.  Typically with nivolumab, we see issues with diarrhea.  Also there could be thyroid issues.  He had his TSH checked back in June which was okay.  I would probably check this again.  I do not see anything with autoimmune hepatitis.  He has no rash.  It was sounds like he may have had some kind of gastroenteritis with all the vomiting that he had.  He certainly does look okay right now.  Hopefully, he will be able to go home soon.  I very much appreciate everybody's help with taking care of Mr. Polidori.  Lattie Haw, MD  Romans 5:3-5

## 2019-01-16 ENCOUNTER — Ambulatory Visit: Payer: PPO

## 2019-01-16 LAB — BASIC METABOLIC PANEL
Anion gap: 5 (ref 5–15)
BUN: 15 mg/dL (ref 6–20)
CO2: 23 mmol/L (ref 22–32)
Calcium: 8.1 mg/dL — ABNORMAL LOW (ref 8.9–10.3)
Chloride: 112 mmol/L — ABNORMAL HIGH (ref 98–111)
Creatinine, Ser: 1.36 mg/dL — ABNORMAL HIGH (ref 0.61–1.24)
GFR calc Af Amer: >60 mL/min (ref >60)
GFR calc non Af Amer: 59 mL/min — ABNORMAL LOW (ref >60)
Glucose, Bld: 88 mg/dL (ref 70–99)
Potassium: 3.4 mmol/L — ABNORMAL LOW (ref 3.5–5.1)
Sodium: 140 mmol/L (ref 135–145)

## 2019-01-16 MED ORDER — LOPERAMIDE HCL 2 MG PO CAPS: 4.0000 mg | ORAL_CAPSULE | ORAL | 0 refills | Status: DC | PRN

## 2019-01-16 MED ORDER — LOPERAMIDE HCL 2 MG PO CAPS: 4.0000 mg | ORAL_CAPSULE | ORAL | Status: DC | PRN

## 2019-01-16 MED ORDER — POTASSIUM & SODIUM PHOSPHATES 280-160-250 MG PO PACK: 1.0000 | PACK | Freq: Three times a day (TID) | ORAL | Status: DC

## 2019-01-16 NOTE — Progress Notes (Signed)
Patient ambulated with NT on RA of an oxygen saturation of 88-94%. Patient tolerated ambulation well.   Farley Ly RN

## 2019-01-16 NOTE — Plan of Care (Signed)
  Problem: Clinical Measurements: Goal: Ability to maintain a body temperature in the normal range will improve Outcome: Progressing   

## 2019-01-16 NOTE — Progress Notes (Signed)
James Byrd to be discharged home  per MD order. Discussed prescriptions and follow up appointments with the patient. Prescriptions given to patient; medication list explained in detail. Patient verbalized understanding.  Skin clean, dry and intact without evidence of skin break down, no evidence of skin tears noted. IV catheter discontinued intact. Site without signs and symptoms of complications. Dressing and pressure applied. Pt denies pain at the site currently. No complaints noted.  Patient free of lines, drains, and wounds.   An After Visit Summary (AVS) was printed and given to the patient. Patient escorted via wheelchair, and discharged home via private auto.  Baldo Ash, RN

## 2019-01-16 NOTE — Progress Notes (Signed)
Mr. Gavigan looks pretty good this morning.  He has not worn oxygen.  His chest x-ray yesterday looked all right.  He did not have any obvious pneumonia.  I am still not sure if he really had pneumonia.  He is having some diarrhea.  This might be from the antibiotic.  This also might be from his immunotherapy.  There really is no way to tell outside of but not him on high-dose steroids.  He says he is going home today.  I think this would be reasonable from my perspective.  I did check his thyroid.  I wanted to make sure that he was not hypothyroid.  All this looked okay.  His labs today show sodium 140 potassium 3.4.  BUN 15 creatinine 1.36.  He has had no fever.  On his physical exam, his temperature is 98.3.  Pulse 77.  Blood pressure 112/76.  Oral exam shows no mucositis.  There is no adenopathy in the neck.  Lungs are clear bilaterally.  There might be some slight wheezing over on the left side.  Cardiac exam regular rate and rhythm.  He has no murmurs.  Abdomen is soft.  Bowel sounds are hyperactive.  There is no guarding or rebound tenderness.  There is no abdominal mass.  He has no palpable liver or spleen tip.  Extremities shows no clubbing, cyanosis or edema.  Neurological exam shows no focal neurological deficits.  Again, Mr. Brozek should be going home today.  The diarrhea hopefully will not be a problem.  We will try him on some Imodium and see that helps.  I would probably stop his antibiotics until cultures are negative.  I really appreciate all the wonderful care that he received from everybody up on [address].  Lattie Haw, MD  [place] 12:12

## 2019-01-16 NOTE — Discharge Summary (Signed)
Physician Discharge Summary  James Byrd POL:[id number] DOB: [birth year] DOA: 01/13/2019  PCP: Emmaline Kluver, MD  Admit date: 01/13/2019 Discharge date: 01/16/2019  Time spent: 25 minutes  Recommendations for Outpatient Follow-up:  1. Outpatient discussion regarding Florinef Cortef and testosterone as per PCP 2. Will need Chem-12, CBC in outpatient setting 3. Defer management of malignant melanoma to oncologist-May need adjustment of therapies 4. Recommend chest x-ray in 1 week to determine if chest is clear  Discharge Diagnoses:  Principal Problem:   Severe sepsis with septic shock ([place]) Active Problems:   HCAP (healthcare-associated pneumonia)   Septic shock ([place])   Acute respiratory failure with hypoxia ([place])   AKI (acute kidney injury) ([place])   Discharge Condition: Improved  Diet recommendation: Regular  Filed Weights   01/15/19 0127 01/16/19 0500  Weight: 113 kg 113 kg    History of present illness:  53 year old Caucasian male with recent diagnosis of malignant melanoma stage IIIb with nodal recurrence right inguinal nodes status post chemo XRT started on nivolumab 400 q. monthly 12/2018 Admitted from [facility] with pneumonia, septic shock-transiently on Levophed Placed on Vanco Zosyn which was narrowed to Zosyn It was felt that he might of had healthcare associated pneumonia-  Hospital Course:  Septic shock secondary to H CAP this resolved.  he was narrowed to IV Zosyn and this was discontinued day of discharge as this was felt to be better Right IJ was removed Acute respiratory failure Desat screen pending will likely not need oxygen and been ambulating in the room Acute kidney injury On admission BUN/creatinine 19/1.5 resolved 15/1.3 close to baseline he was mildly hyperchloremic likely secondary to resuscitation saline was discontinued Stage IIIb melanoma Outpatient surveillance monitoring Testosterone replacement Patient should be careful with  regards to replacement-already has a underlying cancer diagnosis also is on Florinef and Cortef which have been resumed prior to admission doses  Procedures:  Multiple   Consultations:  Oncology  Discharge Exam: Vitals:   01/15/19 2300 01/16/19 0726  BP: 112/76 114/75  Pulse: 77 77  Resp: 16 (!) 28  Temp: 98.3 F (36.8 C) 98.3 F (36.8 C)  SpO2: 98% 93%    General: Awake coherent pleasant no distress no cough no cold Cardiovascular: S1-S2 no murmur rub or gallop Respiratory: Clinically clear no added sound  Discharge Instructions   Discharge Instructions    Diet - low sodium heart healthy   Complete by: As directed    Discharge instructions   Complete by: As directed    Please take your medications as indicated by her primary care physician Talk to Dr. Marin Olp about some of your medications such as your steroids in addition to your testosterone with your new diagnosis of cancer Would recommend outpatient follow-up with Dr. Marin Olp in the outpatient setting-he should be contacting you otherwise call him within a week   Increase activity slowly   Complete by: As directed      Allergies as of 01/16/2019   No Known Allergies     Medication List    STOP taking these medications   aspirin-sod bicarb-citric acid 325 MG Tbef tablet Commonly known as: ALKA-SELTZER     TAKE these medications   augmented betamethasone dipropionate 0.05 % cream Commonly known as: DIPROLENE-AF Apply 1 application topically 2 (two) times daily as needed (psoriasis).   CALCIUM 600 + D PO Take 1 tablet by mouth at bedtime.   diclofenac sodium 1 % Gel Commonly known as: VOLTAREN   fludrocortisone 0.1 MG tablet Commonly  known as: FLORINEF Take 0.1 mg by mouth daily.   gabapentin 600 MG tablet Commonly known as: NEURONTIN Take 600 mg by mouth 3 (three) times daily. Taking twice daily   hydrocortisone 5 MG tablet Commonly known as: CORTEF Take 5 mg by mouth 2 (two) times daily.    loperamide 2 MG capsule Commonly known as: IMODIUM Take 2 capsules (4 mg total) by mouth as needed for diarrhea or loose stools.   multivitamin tablet Take 1 tablet by mouth daily.   potassium & sodium phosphates 280-160-250 MG Pack Commonly known as: PHOS-NAK Take 1 packet by mouth 4 (four) times daily -  with meals and at bedtime.   SUPER B COMPLEX/C PO Take 1 tablet by mouth daily.   tadalafil 10 MG tablet Commonly known as: Cialis Take 1 tablet 30 min prior to sexual activity   testosterone 50 MG/5GM (1%) Gel Commonly known as: ANDROGEL Place 5 g onto the skin daily.   vitamin C 1000 MG tablet Take 1,000 mg by mouth daily.   Vitamin D 50 MCG (2000 UT) tablet Take 2,000 Units by mouth daily.      No Known Allergies    The results of significant diagnostics from this hospitalization (including imaging, microbiology, ancillary and laboratory) are listed below for reference.    Significant Diagnostic Studies: Dg Chest 2 View  Result Date: 01/15/2019 CLINICAL DATA:  Pneumonia EXAM: CHEST - 2 VIEW COMPARISON:  01/13/2019 FINDINGS: No significant change in right basilar, band or platelike atelectasis or scarring. No other airspace opacities. Right neck vascular catheter. IMPRESSION: No significant change in right basilar, band or platelike atelectasis or scarring. No other airspace opacities. Right neck vascular catheter. Electronically Signed   By: Eddie Candle M.D.   On: 01/15/2019 12:51   Dg Chest Port 1 View  Result Date: 01/13/2019 CLINICAL DATA:  Fever EXAM: PORTABLE CHEST 1 VIEW COMPARISON:  PET CT 10/30/2018, radiograph 03/12/2015 FINDINGS: Subsegmental atelectasis or scarring at the right base. Streaky atelectasis or scar at the left infrahilar lung. No focal consolidation. Normal heart size. No pneumothorax. Catheter projects over right neck with tip positioned over the distal jugular vein. IMPRESSION: Subsegmental atelectasis or scarring at both lung bases.  Electronically Signed   By: Donavan Foil M.D.   On: 01/13/2019 23:18    Microbiology: Recent Results (from the past 240 hour(s))  MRSA PCR Screening     Status: None   Collection Time: 01/13/19  6:14 PM   Specimen: Nasal Mucosa; Nasopharyngeal  Result Value Ref Range Status   MRSA by PCR NEGATIVE NEGATIVE Final    Comment:        The GeneXpert MRSA Assay (FDA approved for NASAL specimens only), is one component of a comprehensive MRSA colonization surveillance program. It is not intended to diagnose MRSA infection nor to guide or monitor treatment for MRSA infections. Performed at [facility], [place] [address], [place], [place] [address]      Labs: Basic Metabolic Panel: Recent Labs  Lab 01/14/19 0410 01/15/19 0432 01/16/19 0434  NA 141 138 140  K 4.4 3.8 3.4*  CL 110 108 112*  CO2 22 22 23   GLUCOSE 123* 106* 88  BUN 19 18 15   CREATININE 1.54* 1.47* 1.36*  CALCIUM 7.9* 7.9* 8.1*  MG  --  2.1  --   PHOS  --  2.4*  --    Liver Function Tests: Recent Labs  Lab 01/14/19 0410  AST 32  ALT 31  ALKPHOS  41  BILITOT 1.6*  PROT 5.5*  ALBUMIN 3.0*   No results for input(s): LIPASE, AMYLASE in the last 168 hours. No results for input(s): AMMONIA in the last 168 hours. CBC: Recent Labs  Lab 01/14/19 0410 01/15/19 0432  WBC 8.8 5.5  HGB 13.1 11.0*  HCT 38.3* 32.7*  MCV 88.7 90.3  PLT 151 115*   Cardiac Enzymes: No results for input(s): CKTOTAL, CKMB, CKMBINDEX, TROPONINI in the last 168 hours. BNP: BNP (last 3 results) No results for input(s): BNP in the last 8760 hours.  ProBNP (last 3 results) No results for input(s): PROBNP in the last 8760 hours.  CBG: Recent Labs  Lab 01/13/19 1812 01/14/19 1145  GLUCAP 86 122*       Signed:  Nita Sells MD   [facility] 01/16/2019, 8:56 AM

## 2019-01-17 ENCOUNTER — Ambulatory Visit
Admission: RE | Admit: 2019-01-17 | Discharge: 2019-01-17 | Disposition: A | Discharge status: Home / Self Care | Payer: PPO | Source: Ambulatory Visit | Attending: Radiation Oncology | Admitting: Radiation Oncology

## 2019-01-17 ENCOUNTER — Other Ambulatory Visit: Payer: Self-pay

## 2019-01-17 DIAGNOSIS — C4371 Malignant melanoma of right lower limb, including hip: Secondary | ICD-10-CM | POA: Diagnosis not present

## 2019-01-17 DIAGNOSIS — Z510 Encounter for antineoplastic radiation therapy: Secondary | ICD-10-CM | POA: Diagnosis not present

## 2019-01-18 ENCOUNTER — Ambulatory Visit
Admission: RE | Admit: 2019-01-18 | Discharge: 2019-01-18 | Disposition: A | Discharge status: Home / Self Care | Payer: PPO | Source: Ambulatory Visit | Attending: Radiation Oncology | Admitting: Radiation Oncology

## 2019-01-18 ENCOUNTER — Telehealth: Payer: Self-pay | Admitting: *Deleted

## 2019-01-18 ENCOUNTER — Other Ambulatory Visit: Payer: Self-pay

## 2019-01-18 DIAGNOSIS — C4371 Malignant melanoma of right lower limb, including hip: Secondary | ICD-10-CM | POA: Diagnosis not present

## 2019-01-18 DIAGNOSIS — E291 Testicular hypofunction: Secondary | ICD-10-CM | POA: Diagnosis not present

## 2019-01-18 DIAGNOSIS — C774 Secondary and unspecified malignant neoplasm of inguinal and lower limb lymph nodes: Secondary | ICD-10-CM | POA: Diagnosis not present

## 2019-01-18 DIAGNOSIS — Z510 Encounter for antineoplastic radiation therapy: Secondary | ICD-10-CM | POA: Diagnosis not present

## 2019-01-18 DIAGNOSIS — G629 Polyneuropathy, unspecified: Secondary | ICD-10-CM | POA: Diagnosis not present

## 2019-01-18 DIAGNOSIS — I9589 Other hypotension: Secondary | ICD-10-CM | POA: Diagnosis not present

## 2019-01-18 DIAGNOSIS — E782 Mixed hyperlipidemia: Secondary | ICD-10-CM | POA: Diagnosis not present

## 2019-01-18 DIAGNOSIS — E669 Obesity, unspecified: Secondary | ICD-10-CM | POA: Diagnosis not present

## 2019-01-18 DIAGNOSIS — E271 Primary adrenocortical insufficiency: Secondary | ICD-10-CM | POA: Diagnosis not present

## 2019-01-18 DIAGNOSIS — Z6833 Body mass index (BMI) 33.0-33.9, adult: Secondary | ICD-10-CM | POA: Diagnosis not present

## 2019-01-18 NOTE — Telephone Encounter (Signed)
Call received from patient's wife wanting to change appts scheduled on 01/31/19 to 02/12/19-02/15/19 d/t pt will be on vacation.  OK to move appts per order of Dr. Marin Olp.  Message sent to scheduling.

## 2019-01-21 ENCOUNTER — Ambulatory Visit
Admission: RE | Admit: 2019-01-21 | Discharge: 2019-01-21 | Disposition: A | Discharge status: Home / Self Care | Payer: PPO | Source: Ambulatory Visit | Attending: Radiation Oncology | Admitting: Radiation Oncology

## 2019-01-21 DIAGNOSIS — C4371 Malignant melanoma of right lower limb, including hip: Secondary | ICD-10-CM | POA: Insufficient documentation

## 2019-01-21 DIAGNOSIS — Z510 Encounter for antineoplastic radiation therapy: Secondary | ICD-10-CM | POA: Insufficient documentation

## 2019-01-22 ENCOUNTER — Other Ambulatory Visit: Payer: Self-pay

## 2019-01-22 ENCOUNTER — Ambulatory Visit
Admission: RE | Admit: 2019-01-22 | Discharge: 2019-01-22 | Disposition: A | Discharge status: Home / Self Care | Payer: PPO | Source: Ambulatory Visit | Attending: Radiation Oncology | Admitting: Radiation Oncology

## 2019-01-22 DIAGNOSIS — J189 Pneumonia, unspecified organism: Secondary | ICD-10-CM | POA: Diagnosis not present

## 2019-01-22 DIAGNOSIS — Z510 Encounter for antineoplastic radiation therapy: Secondary | ICD-10-CM | POA: Diagnosis not present

## 2019-01-22 DIAGNOSIS — Z6832 Body mass index (BMI) 32.0-32.9, adult: Secondary | ICD-10-CM | POA: Diagnosis not present

## 2019-01-22 DIAGNOSIS — C4371 Malignant melanoma of right lower limb, including hip: Secondary | ICD-10-CM | POA: Diagnosis not present

## 2019-01-22 DIAGNOSIS — C774 Secondary and unspecified malignant neoplasm of inguinal and lower limb lymph nodes: Secondary | ICD-10-CM | POA: Diagnosis not present

## 2019-01-23 ENCOUNTER — Telehealth: Payer: Self-pay | Admitting: *Deleted

## 2019-01-23 ENCOUNTER — Ambulatory Visit: Payer: PPO

## 2019-01-23 ENCOUNTER — Ambulatory Visit
Admission: RE | Admit: 2019-01-23 | Discharge: 2019-01-23 | Disposition: A | Discharge status: Home / Self Care | Payer: PPO | Source: Ambulatory Visit | Attending: Radiation Oncology | Admitting: Radiation Oncology

## 2019-01-23 DIAGNOSIS — C4371 Malignant melanoma of right lower limb, including hip: Secondary | ICD-10-CM | POA: Diagnosis not present

## 2019-01-23 DIAGNOSIS — Z510 Encounter for antineoplastic radiation therapy: Secondary | ICD-10-CM | POA: Diagnosis not present

## 2019-01-23 NOTE — Telephone Encounter (Signed)
Patient wife James Byrd called wanted ok to schedule chemotherapy appointments out few months in advance for her employers sake.  Ok per Dr. Marin Olp.  Called to state that patient has been diagnosed with pneumonia and seen by primary care who put him on antibiotics.  Dr. Marin Olp notified.

## 2019-01-24 ENCOUNTER — Ambulatory Visit: Payer: PPO

## 2019-01-24 ENCOUNTER — Telehealth: Payer: Self-pay | Admitting: *Deleted

## 2019-01-24 ENCOUNTER — Emergency Department (HOSPITAL_COMMUNITY): Payer: PPO

## 2019-01-24 ENCOUNTER — Emergency Department (HOSPITAL_COMMUNITY)
Admission: EM | Admit: 2019-01-24 | Discharge: 2019-01-24 | Disposition: A | Discharge status: Home / Self Care | Payer: PPO | Attending: Emergency Medicine | Admitting: Emergency Medicine

## 2019-01-24 ENCOUNTER — Other Ambulatory Visit: Payer: Self-pay

## 2019-01-24 ENCOUNTER — Encounter (HOSPITAL_COMMUNITY): Payer: Self-pay | Admitting: Emergency Medicine

## 2019-01-24 DIAGNOSIS — J189 Pneumonia, unspecified organism: Secondary | ICD-10-CM | POA: Diagnosis not present

## 2019-01-24 DIAGNOSIS — R0602 Shortness of breath: Secondary | ICD-10-CM

## 2019-01-24 DIAGNOSIS — R05 Cough: Secondary | ICD-10-CM | POA: Insufficient documentation

## 2019-01-24 DIAGNOSIS — R918 Other nonspecific abnormal finding of lung field: Secondary | ICD-10-CM | POA: Diagnosis not present

## 2019-01-24 DIAGNOSIS — J90 Pleural effusion, not elsewhere classified: Secondary | ICD-10-CM | POA: Diagnosis not present

## 2019-01-24 LAB — COMPREHENSIVE METABOLIC PANEL
ALT: 25 U/L (ref 0–44)
AST: 19 U/L (ref 15–41)
Albumin: 4 g/dL (ref 3.5–5.0)
Alkaline Phosphatase: 49 U/L (ref 38–126)
Anion gap: 10 (ref 5–15)
BUN: 14 mg/dL (ref 6–20)
CO2: 20 mmol/L — ABNORMAL LOW (ref 22–32)
Calcium: 9.1 mg/dL (ref 8.9–10.3)
Chloride: 109 mmol/L (ref 98–111)
Creatinine, Ser: 1.24 mg/dL (ref 0.61–1.24)
GFR calc Af Amer: >60 mL/min (ref >60)
GFR calc non Af Amer: >60 mL/min (ref >60)
Glucose, Bld: 108 mg/dL — ABNORMAL HIGH (ref 70–99)
Potassium: 3.4 mmol/L — ABNORMAL LOW (ref 3.5–5.1)
Sodium: 139 mmol/L (ref 135–145)
Total Bilirubin: 0.8 mg/dL (ref 0.3–1.2)
Total Protein: 7.3 g/dL (ref 6.5–8.1)

## 2019-01-24 LAB — CBC WITH DIFFERENTIAL/PLATELET
Abs Immature Granulocytes: 0.04 10*3/uL (ref 0.00–0.07)
Basophils Absolute: 0 10*3/uL (ref 0.0–0.1)
Basophils Relative: 0 %
Eosinophils Absolute: 0 10*3/uL (ref 0.0–0.5)
Eosinophils Relative: 0 %
HCT: 41.4 % (ref 39.0–52.0)
Hemoglobin: 14.1 g/dL (ref 13.0–17.0)
Immature Granulocytes: 0 %
Lymphocytes Relative: 8 %
Lymphs Abs: 0.8 10*3/uL (ref 0.7–4.0)
MCH: 29.9 pg (ref 26.0–34.0)
MCHC: 34.1 g/dL (ref 30.0–36.0)
MCV: 87.7 fL (ref 80.0–100.0)
Monocytes Absolute: 0.3 10*3/uL (ref 0.1–1.0)
Monocytes Relative: 3 %
Neutro Abs: 8.1 10*3/uL — ABNORMAL HIGH (ref 1.7–7.7)
Neutrophils Relative %: 89 %
Platelets: 203 10*3/uL (ref 150–400)
RBC: 4.72 MIL/uL (ref 4.22–5.81)
RDW: 13.5 % (ref 11.5–15.5)
WBC: 9.2 10*3/uL (ref 4.0–10.5)
nRBC: 0 % (ref 0.0–0.2)

## 2019-01-24 LAB — D-DIMER, QUANTITATIVE: D-Dimer, Quant: 0.98 ug/mL-FEU — ABNORMAL HIGH (ref 0.00–0.50)

## 2019-01-24 LAB — LACTIC ACID, PLASMA: Lactic Acid, Venous: 1.5 mmol/L (ref 0.5–1.9)

## 2019-01-24 MED ORDER — IOHEXOL 350 MG/ML SOLN
75.0000 mL | Freq: Once | INTRAVENOUS | Status: AC | PRN
  Administered 2019-01-24: 75 mL via INTRAVENOUS

## 2019-01-24 NOTE — Telephone Encounter (Signed)
Message received from patient's wife Hoyle Sauer to inform Dr. Marin Olp that she is taking patient to the [facility] ER now for increased SOB, wheezing and productive cough.  Dr. Marin Olp notified.

## 2019-01-24 NOTE — ED Provider Notes (Signed)
Care assumed at shift change from previous provider.  See note for full HPI.  In summation 53 year old, chemo patient presents for evaluation of worsening shortness of breath.  Occasionally accompanied with coughing.  He has recently been treated for pneumonia.  Finished course of antibiotics from PCP and repeat chest x-ray appear to be a new pneumonia.  He was recently started on Levaquin and has been on this for 3 days.  Also recently started on steroids and albuterol inhaler.  No previous history of PE or DVT.  Currently has melanoma.  Labs at patient's baseline.  Pending CTA of chest to rule out PE.  CTA negative shared decision making with patient.  Physical Exam  BP 127/87   Pulse (!) 56   Temp 98 F (36.7 C) (Oral)   Resp 13   SpO2 94%   Physical Exam Vitals signs and nursing note reviewed.  Constitutional:      General: He is not in acute distress.    Appearance: He is well-developed. He is not ill-appearing, toxic-appearing or diaphoretic.  HENT:     Head: Atraumatic.  Eyes:     Pupils: Pupils are equal, round, and reactive to light.  Neck:     Musculoskeletal: Normal range of motion and neck supple.  Cardiovascular:     Rate and Rhythm: Normal rate and regular rhythm.     Pulses: Normal pulses.     Heart sounds: Normal heart sounds.  Pulmonary:     Effort: Pulmonary effort is normal. No respiratory distress.     Comments: Speaks in full sentences without difficulty.  No accessory muscle usage.  Clear to auscultation bilaterally. Abdominal:     General: There is no distension.     Palpations: Abdomen is soft.  Musculoskeletal: Normal range of motion.     Comments: No edema, erythema, or swelling. Negative Homans sign  Skin:    General: Skin is warm and dry.     Comments: No evidence of DVT.  Brisk capillary refill.  Neurological:     Mental Status: He is alert.     ED Course/Procedures     Procedures Labs Reviewed  CBC WITH DIFFERENTIAL/PLATELET -  Abnormal; Notable for the following components:      Result Value   Neutro Abs 8.1 (*)    All other components within normal limits  COMPREHENSIVE METABOLIC PANEL - Abnormal; Notable for the following components:   Potassium 3.4 (*)    CO2 20 (*)    Glucose, Bld 108 (*)    All other components within normal limits  D-DIMER, QUANTITATIVE (NOT AT [facility]) - Abnormal; Notable for the following components:   D-Dimer, Quant 0.98 (*)    All other components within normal limits  CULTURE, BLOOD (ROUTINE X 2)  CULTURE, BLOOD (ROUTINE X 2)  LACTIC ACID, PLASMA  LACTIC ACID, PLASMA  Dg Chest 2 View  Result Date: 01/15/2019 CLINICAL DATA:  Pneumonia EXAM: CHEST - 2 VIEW COMPARISON:  01/13/2019 FINDINGS: No significant change in right basilar, band or platelike atelectasis or scarring. No other airspace opacities. Right neck vascular catheter. IMPRESSION: No significant change in right basilar, band or platelike atelectasis or scarring. No other airspace opacities. Right neck vascular catheter. Electronically Signed   By: Eddie Candle M.D.   On: 01/15/2019 12:51   Ct Angio Chest Pe W And/or Wo Contrast  Result Date: 01/24/2019 CLINICAL DATA:  Clinical suspicion for PE. EXAM: CT ANGIOGRAPHY CHEST WITH CONTRAST TECHNIQUE: Multidetector CT imaging of the chest was  performed using the standard protocol during bolus administration of intravenous contrast. Multiplanar CT image reconstructions and MIPs were obtained to evaluate the vascular anatomy. CONTRAST:  51mL OMNIPAQUE IOHEXOL 350 MG/ML SOLN COMPARISON:  CT of the chest June 04, 2018 FINDINGS: Cardiovascular: Satisfactory opacification of the pulmonary arteries to the segmental level. No evidence of pulmonary embolism. Normal heart size. No pericardial effusion. Mediastinum/Nodes: No enlarged mediastinal, hilar, or axillary lymph nodes. Thyroid gland, trachea, and esophagus demonstrate no significant findings. Lungs/Pleura: Areas of linear airspace  consolidation versus atelectasis in bilateral lung bases. Small right pleural effusion versus pleural thickening. Upper Abdomen: Stable cystic lesions in the liver. Post cholecystectomy. Musculoskeletal: Sclerotic focus within the left lateral sixth rib. Review of the MIP images confirms the above findings. IMPRESSION: 1. No evidence of pulmonary embolus. 2. Areas of linear airspace consolidation versus atelectasis in bilateral lung bases. 3. Small right pleural effusion versus pleural thickening. 4. Sclerotic focus within the left lateral sixth rib. Please correlate to patient's history of malignancy. Electronically Signed   By: Fidela Salisbury M.D.   On: 01/24/2019 18:09   Dg Chest Port 1 View  Result Date: 01/13/2019 CLINICAL DATA:  Fever EXAM: PORTABLE CHEST 1 VIEW COMPARISON:  PET CT 10/30/2018, radiograph 03/12/2015 FINDINGS: Subsegmental atelectasis or scarring at the right base. Streaky atelectasis or scar at the left infrahilar lung. No focal consolidation. Normal heart size. No pneumothorax. Catheter projects over right neck with tip positioned over the distal jugular vein. IMPRESSION: Subsegmental atelectasis or scarring at both lung bases. Electronically Signed   By: Donavan Foil M.D.   On: 01/13/2019 23:69   MDM  53 year old male appears otherwise well presents for evaluation of worsening shortness of breath.  Care assumed at shift change from previous provider.  Plan to follow-up on CTA to rule out PE.  Labs and imaging personally reviewed, at patients baseline  CTA negative for PE, possible area of linear airspace consolidation versus atelectasis in bilateral lung bases.  Patient states he was recently tested for COVID which was negative.  Does not want repeat testing at this time.  He is currently on Levaquin.  Discussed with patient to continue taking this and follow-up with PCP for reevaluation.  Discussed with patient and family who is comfortable with this plan.  All questions  answered.  Patient without tachycardia, tachypnea or hypoxia.  He is afebrile.  He is ambulatory in room with oxygen saturation greater than 95% on room air.  Low suspicion for ACS, PE, dissection as cause of his shortness of breath.  The patient has been appropriately medically screened and/or stabilized in the ED. I have low suspicion for any other emergent medical condition which would require further screening, evaluation or treatment in the ED or require inpatient management.  Patient is hemodynamically stable and in no acute distress.  Patient able to ambulate in department prior to ED.  Evaluation does not show acute pathology that would require ongoing or additional emergent interventions while in the emergency department or further inpatient treatment.  I have discussed the diagnosis with the patient and answered all questions.  Pain is been managed while in the emergency department and patient has no further complaints prior to discharge.  Patient is comfortable with plan discussed in room and is stable for discharge at this time.  I have discussed strict return precautions for returning to the emergency department.  Patient was encouraged to follow-up with PCP/specialist refer to at discharge.       Treylan Mcclintock  A, PA-C 01/24/19 1906    Davonna Belling, MD 01/24/19 2328

## 2019-01-24 NOTE — ED Triage Notes (Signed)
Pt with complaints of productive clear cough and increased shortness of breath. He was told by his Oncologist to come to the ER related to changes in his xray pneumonia. Breath sounds clear through out. Denise fever, n/v/d.

## 2019-01-24 NOTE — ED Notes (Signed)
Patient transported to CT 

## 2019-01-24 NOTE — Discharge Instructions (Signed)
Take the antibiotics as prescribed.  Follow-up with PCP for reevaluation.  Return for any new worsening symptoms.

## 2019-01-24 NOTE — ED Provider Notes (Signed)
James Byrd   CSN: [id number] Arrival date & time: 01/24/19  1147     History   Chief Complaint Chief Complaint  Byrd presents with  . Shortness of Breath    James Byrd  . Cough    HPI James Byrd is a 53 y.o. male presenting for evaluation of shortness of breath.  Byrd states he has worsening shortness of breath this morning.  He will have fits where it is difficult for him to breathe.  Occasionally this is accompanied with coughing, occasionally with exertion, and occasionally at rest.  Byrd was recently treated for pneumonia.  After finishing antibiotics, he had a recheck with his PCP, and on x-ray there appeared to be a new pneumonia.  As such, he was started on Levaquin which she has been on for 3 days.  He was also started on a higher dose of steroids and an inhaler.  Multiple family members with bronchitis.  He denies fevers, chills, sore throat, chest pain, nausea, vomiting, abdominal pain, urinary symptoms, normal bowel movements, leg pain or swelling.  He denies recent travel, surgeries, immobilization, history of previous DVT/PE.  Byrd is currently receiving James and radiation for melanoma.   Additional history obtained from chart review.  Byrd was admitted 01/13/2023 septic pneumonia.     HPI  Past Medical History:  Diagnosis Date  . Adrenal insufficiency (James Byrd)   . Anxiety 05/24/2012  . Complication of anesthesia    SLO TO AWAKEN MANY 8 TO 9 YRS AGO, NO ISSUES SINCE  . Goals of care, counseling/discussion 12/17/2018  . Gout   . History of kidney stones   . HLD (hyperlipidemia)   . Malignant melanoma of right great toe (James Byrd) 11/14/2014  . Paroxysmal supraventricular tachycardia (James Byrd) 05/24/2012   Described in the treadmill report; details are pending     Byrd Active Problem List   Diagnosis Date Noted  . Severe sepsis with septic shock (James Byrd) 01/15/2019  . Acute respiratory failure with  hypoxia (James Byrd) 01/15/2019  . AKI (acute kidney injury) (James Byrd) 01/15/2019  . Septic shock (James Byrd) 01/13/2019  . Goals of care, counseling/discussion 12/17/2018  . Adrenal insufficiency (James Byrd)   . Refractory nausea and vomiting   . Paranasal sinus disease   . Acute renal failure syndrome (James Byrd)   . HCAP (healthcare-associated pneumonia) 03/06/2015  . Drug-induced pneumonitis - VERVOY   . Thrombocytopenia ([place]) 02/09/2015  . HLD (hyperlipidemia)   . Chronic diastolic congestive heart failure (James Byrd)   . Malignant melanoma of right great toe Stage IIIB ([id number]) 11/14/2014  . Obstructive sleep apnea 07/19/2012  . Paroxysmal supraventricular tachycardia (James Byrd) 05/24/2012  . Chest pressure 05/24/2012  . Palpitations 05/24/2012  . Anxiety 05/24/2012    Past Surgical History:  Procedure Laterality Date  . ABLATION  8 YRS AGO  . CHOLECYSTECTOMY    . EXTRACORPOREAL SHOCK WAVE LITHOTRIPSY  5 YRS AGO  . LYMPH NODE BIOPSY Right 11/06/2018   Procedure: RIGHT INGUINAL LYMPH NODE BIOPSY AND POSSIBLE RIGHT GROIN NODE DISECTION;  Surgeon: James Byrd;  Location: [facility];  Service: General;  Laterality: Right;  . r toe partial ambutation    . ROTATOR CUFF REPAIR Right 2010   3 yrs ago        Home Medications    Prior to Admission medications   Medication Sig Start Date End Date Taking? Authorizing Provider  Ascorbic Acid (VITAMIN C) 1000 MG tablet Take 1,000 mg by mouth daily.   Yes Provider,  Historical, Byrd  augmented betamethasone dipropionate (DIPROLENE-AF) 0.05 % cream Apply 1 application topically 2 (two) times daily as needed (psoriasis).  09/29/15  Yes Provider, Historical, Byrd  Calcium Carb-Cholecalciferol (CALCIUM 600 + D PO) Take 1 tablet by mouth at bedtime.   Yes Provider, Historical, Byrd  Cholecalciferol (VITAMIN D) 2000 units tablet Take 2,000 Units by mouth daily.   Yes Provider, Historical, Byrd  diclofenac sodium (VOLTAREN) 1 % GEL Apply 2 g topically as needed (pain).  10/23/18  Yes  Provider, Historical, Byrd  fludrocortisone (FLORINEF) 0.1 MG tablet Take 0.2 mg by mouth daily.    Yes Provider, Historical, Byrd  gabapentin (NEURONTIN) 600 MG tablet Take 600 mg by mouth 2 (two) times daily. Taking twice daily   Yes Provider, Historical, Byrd  GLUCOSAMINE-CHONDROITIN PO Take 1 tablet by mouth daily.   Yes Provider, Historical, Byrd  hydrocortisone (CORTEF) 5 MG tablet Take 20 mg by mouth 2 (two) times daily.    Yes Provider, Historical, Byrd  levofloxacin (LEVAQUIN) 500 MG tablet Take 500 mg by mouth daily. 01/22/19  Yes Provider, Historical, Byrd  loperamide (IMODIUM) 2 MG capsule Take 2 capsules (4 mg total) by mouth as needed for diarrhea or loose stools. 01/16/19  Yes James Sells, Byrd  Multiple Vitamin (MULTIVITAMIN) tablet Take 1 tablet by mouth daily.   Yes Provider, Historical, Byrd  SUPER B COMPLEX/C PO Take 1 tablet by mouth daily.   Yes Provider, Historical, Byrd  tadalafil (CIALIS) 10 MG tablet Take 1 tablet 30 min prior to sexual activity 10/17/18  Yes James Byrd  testosterone (ANDROGEL) 50 MG/5GM (1%) GEL Place 5 g onto the skin daily.   Yes Provider, Historical, Byrd  potassium & sodium phosphates (PHOS-NAK) 280-160-250 MG PACK Take 1 packet by mouth 4 (four) times daily -  with meals and at bedtime. Byrd not taking: Reported on 01/24/2019 01/16/19   James Sells, Byrd    Family History Family History  Adopted: Yes  Problem Relation Age of Onset  . Stroke Mother   . COPD Father   . Diabetes Father   . Diabetes Sister   . Diabetes Brother     Social History Social History   Tobacco Use  . Smoking status: Never Smoker  . Smokeless tobacco: Never Used  Substance Use Topics  . Alcohol use: No    Alcohol/week: 0.0 standard drinks  . Drug use: No     Allergies   Byrd has no known allergies.   Review of Systems Review of Systems  Respiratory: Positive for shortness of breath.   All other systems reviewed and are negative.    Physical  Exam Updated Vital Signs BP 129/82   Pulse 66   Temp 98 F (36.7 C) (Oral)   Resp 12   SpO2 94%   Physical Exam Vitals signs and nursing Byrd reviewed.  Constitutional:      General: He is not in acute distress.    Appearance: He is well-developed.     Comments: Appears nontoxic  HENT:     Head: Normocephalic and atraumatic.  Eyes:     Extraocular Movements: Extraocular movements intact.     Conjunctiva/sclera: Conjunctivae normal.     Pupils: Pupils are equal, round, and reactive to light.  Neck:     Musculoskeletal: Normal range of motion and neck supple.  Cardiovascular:     Rate and Rhythm: Normal rate and regular rhythm.     Pulses: Normal pulses.  Pulmonary:  Effort: Pulmonary effort is normal. No respiratory distress.     Breath sounds: Rales present. No wheezing.     Comments: Speaking in full sentences.  Rales of the lower lobes bilaterally.  No signs of respiratory distress. Abdominal:     General: There is no distension.     Palpations: Abdomen is soft. There is no mass.     Tenderness: There is no abdominal tenderness. There is no guarding or rebound.  Musculoskeletal: Normal range of motion.     Comments: No leg pain or swelling.   Skin:    General: Skin is warm and dry.  Neurological:     Mental Status: He is alert and oriented to person, place, and time.      ED Treatments / Results  Labs (all labs ordered are listed, but only abnormal results are displayed) Labs Reviewed  CBC WITH DIFFERENTIAL/PLATELET - Abnormal; Notable for the following components:      Result Value   Neutro Abs 8.1 (*)    All other components within normal limits  COMPREHENSIVE METABOLIC PANEL - Abnormal; Notable for the following components:   Potassium 3.4 (*)    CO2 20 (*)    Glucose, Bld 108 (*)    All other components within normal limits  D-DIMER, QUANTITATIVE (NOT AT [facility]) - Abnormal; Notable for the following components:   D-Dimer, Quant 0.98 (*)    All other  components within normal limits  CULTURE, BLOOD (ROUTINE X 2)  CULTURE, BLOOD (ROUTINE X 2)  LACTIC ACID, PLASMA  LACTIC ACID, PLASMA    EKG None  Radiology No results found.  Procedures Procedures (including critical care time)  Medications Ordered in ED Medications - No data to display   Initial Impression / Assessment and Plan / ED Course  I have reviewed the triage vital signs and the nursing notes.  Pertinent labs & imaging results that were available during my care of the Byrd were reviewed by me and considered in my medical decision making (see chart for details).        Byrd presenting for evaluation of shortness of breath.  Physical exam shows Byrd appears nontoxic.  Pulmonary exam without respiratory distress or tachypnea.  Sats stable.  Byrd with mild rales bilaterally.  As he is afebrile and without a cough and currently on antibiotics, lower suspicion for pneumonia.  Consider possible PE.  Labs obtained prior to my evaluation showed no leukocytosis.  Hemoglobin electrolytes stable.  Dimer mildly elevated at 0.98.  As such, will obtain CTA for further evaluation.  Byrd signed out to B Henderley, PA-C for follow-up on CTA.  If negative for clot, Byrd can likely be discharged.  If shows worsening pneumonia, consider admission vs close follow up.  If negative, likely discharge.   Final Clinical Impressions(s) / ED Diagnoses   Final diagnoses:  None    ED Discharge Orders    None       Franchot Heidelberg, PA-C 01/24/19 1624    Tegeler, Gwenyth Allegra, Byrd 01/24/19 [phone]

## 2019-01-25 ENCOUNTER — Other Ambulatory Visit: Payer: Self-pay

## 2019-01-25 ENCOUNTER — Ambulatory Visit: Payer: PPO

## 2019-01-25 ENCOUNTER — Ambulatory Visit
Admission: RE | Admit: 2019-01-25 | Discharge: 2019-01-25 | Disposition: A | Discharge status: Home / Self Care | Payer: PPO | Source: Ambulatory Visit | Attending: Radiation Oncology | Admitting: Radiation Oncology

## 2019-01-25 DIAGNOSIS — Z510 Encounter for antineoplastic radiation therapy: Secondary | ICD-10-CM | POA: Diagnosis not present

## 2019-01-25 DIAGNOSIS — C4371 Malignant melanoma of right lower limb, including hip: Secondary | ICD-10-CM | POA: Diagnosis not present

## 2019-01-28 ENCOUNTER — Ambulatory Visit: Payer: PPO

## 2019-01-28 ENCOUNTER — Ambulatory Visit
Admission: RE | Admit: 2019-01-28 | Discharge: 2019-01-28 | Disposition: A | Discharge status: Home / Self Care | Payer: PPO | Source: Ambulatory Visit | Attending: Radiation Oncology | Admitting: Radiation Oncology

## 2019-01-28 ENCOUNTER — Other Ambulatory Visit: Payer: Self-pay

## 2019-01-28 DIAGNOSIS — Z510 Encounter for antineoplastic radiation therapy: Secondary | ICD-10-CM | POA: Diagnosis not present

## 2019-01-28 DIAGNOSIS — C4371 Malignant melanoma of right lower limb, including hip: Secondary | ICD-10-CM | POA: Diagnosis not present

## 2019-01-29 ENCOUNTER — Encounter: Payer: Self-pay | Admitting: Radiation Oncology

## 2019-01-29 ENCOUNTER — Other Ambulatory Visit: Payer: Self-pay

## 2019-01-29 ENCOUNTER — Ambulatory Visit
Admission: RE | Admit: 2019-01-29 | Discharge: 2019-01-29 | Disposition: A | Discharge status: Home / Self Care | Payer: PPO | Source: Ambulatory Visit | Attending: Radiation Oncology | Admitting: Radiation Oncology

## 2019-01-29 DIAGNOSIS — C4371 Malignant melanoma of right lower limb, including hip: Secondary | ICD-10-CM | POA: Diagnosis not present

## 2019-01-29 DIAGNOSIS — G629 Polyneuropathy, unspecified: Secondary | ICD-10-CM | POA: Diagnosis not present

## 2019-01-29 DIAGNOSIS — E669 Obesity, unspecified: Secondary | ICD-10-CM | POA: Diagnosis not present

## 2019-01-29 DIAGNOSIS — J189 Pneumonia, unspecified organism: Secondary | ICD-10-CM | POA: Diagnosis not present

## 2019-01-29 DIAGNOSIS — Z510 Encounter for antineoplastic radiation therapy: Secondary | ICD-10-CM | POA: Diagnosis not present

## 2019-01-29 DIAGNOSIS — Z6832 Body mass index (BMI) 32.0-32.9, adult: Secondary | ICD-10-CM | POA: Diagnosis not present

## 2019-01-29 DIAGNOSIS — E271 Primary adrenocortical insufficiency: Secondary | ICD-10-CM | POA: Diagnosis not present

## 2019-01-29 LAB — CULTURE, BLOOD (ROUTINE X 2)
Culture: NO GROWTH
Culture: NO GROWTH
Special Requests: ADEQUATE
Special Requests: ADEQUATE

## 2019-01-31 ENCOUNTER — Ambulatory Visit: Payer: PPO

## 2019-01-31 ENCOUNTER — Ambulatory Visit: Payer: PPO | Admitting: Hematology & Oncology

## 2019-01-31 ENCOUNTER — Other Ambulatory Visit: Payer: PPO

## 2019-01-31 NOTE — Progress Notes (Signed)
  Patient Name: James Byrd MRN: [id number] DOB: 11-14-1965 Referring Physician: Christa See (Profile Not Attached) Date of Service: 01/29/2019 [facility], [place]                                                        End Of Treatment Note  Diagnoses: C43.71-Malignant melanoma of right lower limb, including hip  Cancer Staging: Stage IIIB ([id number])  Intent: Curative  Radiation Treatment Dates: 12/27/2018 through 01/29/2019 Site Technique Total Dose Dose per Fx Completed Fx Beam Energies  Extremity: Ext_Rt_rt inguinal 3D 48/48 Gy 2.4 20/20 6X, 10X   Narrative: The patient tolerated radiation therapy relatively well. He experienced some mild fatigue, but no skin issues or other concerns. He did miss 3 days in the middle of treatment (7/27-7/29) as he was in the ICU for pneumonia. He is feeling much better at this time.  Plan: The patient will follow-up with radiation oncology in one month.  ________________________________________________  Blair Promise, PhD, MD  This document serves as a record of services personally performed by Gery Pray, MD. It was created on his behalf by Rae Lips, a trained medical scribe. The creation of this record is based on the scribe's personal observations and the provider's statements to them. This document has been checked and approved by the attending provider.

## 2019-02-04 ENCOUNTER — Other Ambulatory Visit: Payer: PPO

## 2019-02-04 ENCOUNTER — Ambulatory Visit: Payer: PPO | Admitting: Hematology & Oncology

## 2019-02-06 ENCOUNTER — Telehealth: Payer: Self-pay | Admitting: *Deleted

## 2019-02-06 NOTE — Telephone Encounter (Signed)
Message received from patient's wife Hoyle Sauer stating that she is currently on a Z-pak for congestion and cough and would like to know if it is ok for James Byrd to start Z-pak per order of his PCP.  Call placed back to patient's wife and notified her per order of Dr. Marin Olp that it is ok for pt to start Z-pak.  Pt.'s wife appreciative of call back and has no further questions or concerns at this time.

## 2019-02-15 ENCOUNTER — Inpatient Hospital Stay: Payer: PPO | Admitting: Hematology & Oncology

## 2019-02-15 ENCOUNTER — Inpatient Hospital Stay: Payer: PPO

## 2019-02-18 ENCOUNTER — Inpatient Hospital Stay: Payer: PPO

## 2019-02-18 ENCOUNTER — Inpatient Hospital Stay: Payer: PPO | Attending: Hematology & Oncology

## 2019-02-18 ENCOUNTER — Inpatient Hospital Stay (HOSPITAL_BASED_OUTPATIENT_CLINIC_OR_DEPARTMENT_OTHER): Payer: PPO | Admitting: Hematology & Oncology

## 2019-02-18 ENCOUNTER — Other Ambulatory Visit: Payer: Self-pay

## 2019-02-18 ENCOUNTER — Encounter: Payer: Self-pay | Admitting: Hematology & Oncology

## 2019-02-18 ENCOUNTER — Other Ambulatory Visit: Payer: Self-pay | Admitting: *Deleted

## 2019-02-18 VITALS — BP 117/76 | HR 76 | Temp 97.0°F | Resp 20 | Wt 234.8 lb

## 2019-02-18 DIAGNOSIS — C4371 Malignant melanoma of right lower limb, including hip: Secondary | ICD-10-CM

## 2019-02-18 DIAGNOSIS — E2740 Unspecified adrenocortical insufficiency: Secondary | ICD-10-CM

## 2019-02-18 DIAGNOSIS — Z5112 Encounter for antineoplastic immunotherapy: Secondary | ICD-10-CM | POA: Diagnosis not present

## 2019-02-18 DIAGNOSIS — M109 Gout, unspecified: Secondary | ICD-10-CM | POA: Diagnosis not present

## 2019-02-18 DIAGNOSIS — Z79899 Other long term (current) drug therapy: Secondary | ICD-10-CM | POA: Diagnosis not present

## 2019-02-18 DIAGNOSIS — C774 Secondary and unspecified malignant neoplasm of inguinal and lower limb lymph nodes: Secondary | ICD-10-CM | POA: Insufficient documentation

## 2019-02-18 DIAGNOSIS — E782 Mixed hyperlipidemia: Secondary | ICD-10-CM | POA: Diagnosis not present

## 2019-02-18 LAB — CMP (CANCER CENTER ONLY)
ALT: 8 U/L (ref 0–44)
AST: 11 U/L — ABNORMAL LOW (ref 15–41)
Albumin: 4.3 g/dL (ref 3.5–5.0)
Alkaline Phosphatase: 73 U/L (ref 38–126)
Anion gap: 10 (ref 5–15)
BUN: 21 mg/dL — ABNORMAL HIGH (ref 6–20)
CO2: 22 mmol/L (ref 22–32)
Calcium: 9.2 mg/dL (ref 8.9–10.3)
Chloride: 108 mmol/L (ref 98–111)
Creatinine: 1 mg/dL (ref 0.61–1.24)
GFR, Est AFR Am: >60 mL/min (ref >60)
GFR, Estimated: >60 mL/min (ref >60)
Glucose, Bld: 117 mg/dL — ABNORMAL HIGH (ref 70–99)
Potassium: 3.4 mmol/L — ABNORMAL LOW (ref 3.5–5.1)
Sodium: 140 mmol/L (ref 135–145)
Total Bilirubin: 0.5 mg/dL (ref 0.3–1.2)
Total Protein: 7 g/dL (ref 6.5–8.1)

## 2019-02-18 LAB — CBC WITH DIFFERENTIAL (CANCER CENTER ONLY)
Abs Immature Granulocytes: 0.01 10*3/uL (ref 0.00–0.07)
Basophils Absolute: 0 10*3/uL (ref 0.0–0.1)
Basophils Relative: 1 %
Eosinophils Absolute: 0.2 10*3/uL (ref 0.0–0.5)
Eosinophils Relative: 5 %
HCT: 42.8 % (ref 39.0–52.0)
Hemoglobin: 14.6 g/dL (ref 13.0–17.0)
Immature Granulocytes: 0 %
Lymphocytes Relative: 17 %
Lymphs Abs: 0.7 10*3/uL (ref 0.7–4.0)
MCH: 29.9 pg (ref 26.0–34.0)
MCHC: 34.1 g/dL (ref 30.0–36.0)
MCV: 87.7 fL (ref 80.0–100.0)
Monocytes Absolute: 0.3 10*3/uL (ref 0.1–1.0)
Monocytes Relative: 7 %
Neutro Abs: 2.8 10*3/uL (ref 1.7–7.7)
Neutrophils Relative %: 70 %
Platelet Count: 142 10*3/uL — ABNORMAL LOW (ref 150–400)
RBC: 4.88 MIL/uL (ref 4.22–5.81)
RDW: 12.9 % (ref 11.5–15.5)
WBC Count: 4 10*3/uL (ref 4.0–10.5)
nRBC: 0 % (ref 0.0–0.2)

## 2019-02-18 LAB — LACTATE DEHYDROGENASE: LDH: 175 U/L (ref 98–192)

## 2019-02-18 MED ORDER — SODIUM CHLORIDE 0.9 % IV SOLN
480.0000 mg | Freq: Once | INTRAVENOUS | Status: AC
  Administered 2019-02-18: 13:00:00 480 mg via INTRAVENOUS
  Filled 2019-02-18: qty 48

## 2019-02-18 MED ORDER — LEVOFLOXACIN 500 MG PO TABS: 500.0000 mg | ORAL_TABLET | Freq: Every day | ORAL | 5 refills | Status: AC

## 2019-02-18 MED ORDER — SODIUM CHLORIDE 0.9 % IV SOLN
Freq: Once | INTRAVENOUS | Status: AC
  Administered 2019-02-18: 12:00:00 via INTRAVENOUS
  Filled 2019-02-18: qty 250

## 2019-02-18 NOTE — Patient Instructions (Signed)
Nivolumab injection What is this medicine? NIVOLUMAB (nye VOL ue mab) is a monoclonal antibody. It is used to treat melanoma, lung cancer, kidney cancer, head and neck cancer, Hodgkin lymphoma, urothelial cancer, colon cancer, and liver cancer. This medicine may be used for other purposes; ask your health care provider or pharmacist if you have questions. COMMON BRAND NAME(S): Opdivo What should I tell my health care provider before I take this medicine? They need to know if you have any of these conditions:  diabetes  immune system problems  kidney disease  liver disease  lung disease  organ transplant  stomach or intestine problems  thyroid disease  an unusual or allergic reaction to nivolumab, other medicines, foods, dyes, or preservatives  pregnant or trying to get pregnant  breast-feeding How should I use this medicine? This medicine is for infusion into a vein. It is given by a health care professional in a hospital or clinic setting. A special MedGuide will be given to you before each treatment. Be sure to read this information carefully each time. Talk to your pediatrician regarding the use of this medicine in children. While this drug may be prescribed for children as young as 12 years for selected conditions, precautions do apply. Overdosage: If you think you have taken too much of this medicine contact a poison control center or emergency room at once. NOTE: This medicine is only for you. Do not share this medicine with others. What if I miss a dose? It is important not to miss your dose. Call your doctor or health care professional if you are unable to keep an appointment. What may interact with this medicine? Interactions have not been studied. Give your health care provider a list of all the medicines, herbs, non-prescription drugs, or dietary supplements you use. Also tell them if you smoke, drink alcohol, or use illegal drugs. Some items may interact with your  medicine. This list may not describe all possible interactions. Give your health care provider a list of all the medicines, herbs, non-prescription drugs, or dietary supplements you use. Also tell them if you smoke, drink alcohol, or use illegal drugs. Some items may interact with your medicine. What should I watch for while using this medicine? This drug may make you feel generally unwell. Continue your course of treatment even though you feel ill unless your doctor tells you to stop. You may need blood work done while you are taking this medicine. Do not become pregnant while taking this medicine or for 5 months after stopping it. Women should inform their doctor if they wish to become pregnant or think they might be pregnant. There is a potential for serious side effects to an unborn child. Talk to your health care professional or pharmacist for more information. Do not breast-feed an infant while taking this medicine or for 5 months after stopping it. What side effects may I notice from receiving this medicine? Side effects that you should report to your doctor or health care professional as soon as possible:  allergic reactions like skin rash, itching or hives, swelling of the face, lips, or tongue  breathing problems  blood in the urine  bloody or watery diarrhea or black, tarry stools  changes in emotions or moods  changes in vision  chest pain  cough  dizziness  feeling faint or lightheaded, falls  fever, chills  headache with fever, neck stiffness, confusion, loss of memory, sensitivity to light, hallucination, loss of contact with reality, or seizures  joint   pain  mouth sores  redness, blistering, peeling or loosening of the skin, including inside the mouth  severe muscle pain or weakness  signs and symptoms of high blood sugar such as dizziness; dry mouth; dry skin; fruity breath; nausea; stomach pain; increased hunger or thirst; increased urination  signs and  symptoms of kidney injury like trouble passing urine or change in the amount of urine  signs and symptoms of liver injury like dark yellow or brown urine; general ill feeling or flu-like symptoms; light-colored stools; loss of appetite; nausea; right upper belly pain; unusually weak or tired; yellowing of the eyes or skin  swelling of the ankles, feet, hands  trouble passing urine or change in the amount of urine  unusually weak or tired  weight gain or loss Side effects that usually do not require medical attention (report to your doctor or health care professional if they continue or are bothersome):  bone pain  constipation  decreased appetite  diarrhea  muscle pain  nausea, vomiting  tiredness This list may not describe all possible side effects. Call your doctor for medical advice about side effects. You may report side effects to FDA at 1-800-FDA-1088. Where should I keep my medicine? This drug is given in a hospital or clinic and will not be stored at home. NOTE: This sheet is a summary. It may not cover all possible information. If you have questions about this medicine, talk to your doctor, pharmacist, or health care provider.  2020 Elsevier/Gold Standard (2017-10-25 12:55:04)  

## 2019-02-18 NOTE — Progress Notes (Signed)
Hematology and Oncology Follow Up Visit  James Byrd [id number] 12/30/1965 53 y.o. 02/18/2019   Principle Diagnosis:  Stage IIIB ([id number]) subungual melanoma of the right hallux -- nodal recurrence in the RIGHT inguinal nodes -- BRAF wt  Current Therapy:   Yervoy-status post 3 cycles - discontinued in September 2016 secondary to toxicity  XRT to the RIGHT inguinal basin Nivolumab 400 mg q month -- s/p cycle #1 -start in 12/2018   Interim History:  James Byrd is here today for follow-up.  He apparently was hospitalized back in late July.  He says he had double pneumonia.  He had a CT angiogram done on 01/24/2019.  Thankfully there is no pulmonary embolism.  He had linear airspace consolidation versus atelectasis in the lung bases.  I am not sure this is what they considered as pneumonia.  He seems to be doing better right now.  Unfortunately his brother passed away from cancer and had to be buried over the weekend.  This was very difficult for James Byrd.  He has little bit of a cough.  This is a dry type of cough.  He apparently is on hydrocortisone.  I am not sure exactly why he is on hydrocortisone.  I am not sure why he would have adrenal insufficiency.  I am not sure who he follows for this.  We have to try to keep him on the lowest dose possible with him being on Immunotherapy.  He has had no diarrhea.  He finished his radiation therapy a couple weeks ago.  I told him that we probably would not do a follow-up PET scan to see however the looks for about 6 weeks afterwards.  There has been no nausea.  He has had no rashes.  He has had no pruritus.  There is been no diarrhea.   Currently, his performance status is ECOG 1.  Allergies: No Known Allergies  Past Medical History, Surgical history, Social history, and Family History were reviewed and updated.  Review of Systems: Review of Systems  Constitutional: Positive for malaise/fatigue.  HENT: Negative.   Eyes: Negative.    Respiratory: Positive for shortness of breath.   Cardiovascular: Negative.   Gastrointestinal: Positive for constipation and nausea.  Genitourinary: Negative.   Musculoskeletal: Positive for joint pain and myalgias.  Skin: Negative.   Neurological: Positive for tingling and focal weakness.  Endo/Heme/Allergies: Negative.   Psychiatric/Behavioral: Positive for memory loss.     Physical Exam:  weight is 234 lb 12.8 oz (106.5 kg). His oral temperature is 97 F (36.1 C) (abnormal). His blood pressure is 117/76 and his pulse is 76. His respiration is 20 and oxygen saturation is 98%.   Wt Readings from Last 3 Encounters:  02/18/19 234 lb 12.8 oz (106.5 kg)  01/16/19 249 lb 1.9 oz (113 kg)  01/03/19 241 lb (109.3 kg)    Physical Exam Vitals signs reviewed.  HENT:     Head: Normocephalic and atraumatic.  Eyes:     Pupils: Pupils are equal, round, and reactive to light.  Neck:     Musculoskeletal: Normal range of motion.  Cardiovascular:     Rate and Rhythm: Normal rate and regular rhythm.     Heart sounds: Normal heart sounds.  Pulmonary:     Effort: Pulmonary effort is normal.     Breath sounds: Normal breath sounds.  Abdominal:     General: Bowel sounds are normal.     Palpations: Abdomen is soft.     Comments: Abdominal  exam shows a soft abdomen.  Bowel sounds are present.  There is no palpable abdominal mass.  There is no palpable liver or spleen tip.  He has a healing right inguinal lymphadenectomy scar.  I cannot palpate any obvious adenopathy.  The left inguinal area is unremarkable.    Musculoskeletal: Normal range of motion.        General: No tenderness or deformity.     Comments: The big toe on the right foot is amputated.  There is no lymphedema in the right leg.  He has good pulses in his distal extremities.  Left leg is unremarkable.  Lymphadenopathy:     Cervical: No cervical adenopathy.  Skin:    General: Skin is warm and dry.     Findings: No erythema or rash.   Neurological:     Mental Status: He is alert and oriented to person, place, and time.  Psychiatric:        Behavior: Behavior normal.        Thought Content: Thought content normal.        Judgment: Judgment normal.      Lab Results  Component Value Date   WBC 4.0 02/18/2019   HGB 14.6 02/18/2019   HCT 42.8 02/18/2019   MCV 87.7 02/18/2019   PLT 142 (L) 02/18/2019   Lab Results  Component Value Date   FERRITIN 363 (H) 09/04/2015   IRON 55 09/04/2015   TIBC 200 (L) 09/04/2015   UIBC 145 09/04/2015   IRONPCTSAT 28 09/04/2015   Lab Results  Component Value Date   RBC 4.88 02/18/2019   No results found for: KPAFRELGTCHN, LAMBDASER, KAPLAMBRATIO No results found for: Kandis Cocking, IGMSERUM No results found for: Odetta Pink, SPEI   Chemistry      Component Value Date/Time   NA 140 02/18/2019 0930   NA 145 06/06/2017 1316   NA 141 10/16/2015 1334   K 3.4 (L) 02/18/2019 0930   K 4.1 06/06/2017 1316   K 3.9 10/16/2015 1334   CL 108 02/18/2019 0930   CL 108 06/06/2017 1316   CO2 22 02/18/2019 0930   CO2 26 06/06/2017 1316   CO2 21 (L) 10/16/2015 1334   BUN 21 (H) 02/18/2019 0930   BUN 19 06/06/2017 1316   BUN 20.0 10/16/2015 1334   CREATININE 1.00 02/18/2019 0930   CREATININE 1.0 06/06/2017 1316   CREATININE 1.0 10/16/2015 1334      Component Value Date/Time   CALCIUM 9.2 02/18/2019 0930   CALCIUM 9.4 06/06/2017 1316   CALCIUM 9.7 10/16/2015 1334   ALKPHOS 73 02/18/2019 0930   ALKPHOS 78 06/06/2017 1316   ALKPHOS 62 10/16/2015 1334   AST 11 (L) 02/18/2019 0930   AST 16 10/16/2015 1334   ALT 8 02/18/2019 0930   ALT 27 06/06/2017 1316   ALT 11 10/16/2015 1334   BILITOT 0.5 02/18/2019 0930   BILITOT 0.49 10/16/2015 1334      Impression and Plan: James Byrd is a very pleasant 54 yo caucasian gentleman with history of stage IIIB melanoma of the right hallux that was subungual with one microscopic positive  inguinal lymph node. He completed 3 cycles of Yervoy but stopped due to side effects.   We will go ahead and proceed with his second cycle of nivolumab today.  I really do not think that this "double pneumonia" was anything reflective of his nivolumab.  However, I will have him on some prophylactic antibiotic.  Apparently,  according to his wife, whenever he mows the yard, he gets sick.  I would tell him not to mow the yard but I know this is something that he enjoys and I want to make sure that his quality of life is good.  I will still try to push through and give him the full course of nivolumab.  We will get him back in 1 more month.  I spent about 40 minutes with he and his wife today.  I had answered all of their questions.  I reviewed the lab work with him.  I will certainly pray hard for him as he is having a tough time with the passing of his brother.     [name], MD 8/31/202011:13 AM

## 2019-02-19 ENCOUNTER — Telehealth: Payer: Self-pay | Admitting: *Deleted

## 2019-02-19 LAB — TESTOSTERONE: Testosterone: 1042 ng/dL — ABNORMAL HIGH (ref 264–916)

## 2019-02-19 NOTE — Telephone Encounter (Signed)
As noted below by Dr. Marin Olp, I informed the patient of his testosterone level. He verbalized understanding.

## 2019-02-19 NOTE — Telephone Encounter (Signed)
-----   Message from [name], MD sent at 02/19/2019  8:17 AM EDT ----- Call - the testosterone level is very good -- 1042!!!  James Byrd

## 2019-02-28 ENCOUNTER — Other Ambulatory Visit: Payer: PPO

## 2019-02-28 ENCOUNTER — Ambulatory Visit: Payer: PPO

## 2019-02-28 ENCOUNTER — Ambulatory Visit: Payer: PPO | Admitting: Hematology & Oncology

## 2019-03-01 ENCOUNTER — Other Ambulatory Visit: Payer: Self-pay | Admitting: *Deleted

## 2019-03-01 ENCOUNTER — Telehealth: Payer: Self-pay | Admitting: *Deleted

## 2019-03-01 DIAGNOSIS — C4371 Malignant melanoma of right lower limb, including hip: Secondary | ICD-10-CM

## 2019-03-01 MED ORDER — HYDROCORTISONE 5 MG PO TABS: 5.0000 mg | ORAL_TABLET | Freq: Two times a day (BID) | ORAL | 0 refills | Status: DC

## 2019-03-01 MED ORDER — TESTOSTERONE 50 MG/5GM (1%) TD GEL: 5.0000 g | Freq: Every day | TRANSDERMAL | 0 refills | Status: DC

## 2019-03-01 NOTE — Telephone Encounter (Signed)
Call received from patient's wife stating that Hydrocortisone tablets are on back order and pt needs a prescription sent for Cortef 5 mg BID to [facility] in [place], pt.'s wife also requesting a refill for Testosterone gel to be sent to [facility].  Dr. Marin Olp notified and prescriptions sent.

## 2019-03-04 ENCOUNTER — Ambulatory Visit
Admission: RE | Admit: 2019-03-04 | Discharge: 2019-03-04 | Disposition: A | Discharge status: Home / Self Care | Payer: PPO | Source: Ambulatory Visit | Attending: Radiation Oncology | Admitting: Radiation Oncology

## 2019-03-04 ENCOUNTER — Other Ambulatory Visit: Payer: Self-pay

## 2019-03-04 ENCOUNTER — Encounter: Payer: Self-pay | Admitting: Radiation Oncology

## 2019-03-04 VITALS — BP 104/77 | HR 89 | Temp 97.8°F | Resp 18 | Ht 72.0 in | Wt 228.2 lb

## 2019-03-04 DIAGNOSIS — C4371 Malignant melanoma of right lower limb, including hip: Secondary | ICD-10-CM | POA: Diagnosis not present

## 2019-03-04 DIAGNOSIS — Z510 Encounter for antineoplastic radiation therapy: Secondary | ICD-10-CM | POA: Insufficient documentation

## 2019-03-04 NOTE — Patient Instructions (Signed)
Coronavirus (COVID-19) Are you at risk?  Are you at risk for the Coronavirus (COVID-19)?  To be considered HIGH RISK for Coronavirus (COVID-19), you have to meet the following criteria:  . Traveled to [place], [place], [place], [place] or [place]; or in the [place] to [place], [place], [place], or [place]; and have fever, cough, and shortness of breath within the last 2 weeks of travel OR . Been in close contact with a person diagnosed with COVID-19 within the last 2 weeks and have fever, cough, and shortness of breath . IF YOU DO NOT MEET THESE CRITERIA, YOU ARE CONSIDERED LOW RISK FOR COVID-19.  What to do if you are HIGH RISK for COVID-19?  . If you are having a medical emergency, call 911. . Seek medical care right away. Before you go to a doctor's office, urgent care or emergency department, call ahead and tell them about your recent travel, contact with someone diagnosed with COVID-19, and your symptoms. You should receive instructions from your physician's office regarding next steps of care.  . When you arrive at healthcare provider, tell the healthcare staff immediately you have returned from visiting [place], [place], [place], [place] or [place]; or traveled in the [place] to [place], [place], [place], or [place]; in the last two weeks or you have been in close contact with a person diagnosed with COVID-19 in the last 2 weeks.   . Tell the health care staff about your symptoms: fever, cough and shortness of breath. . After you have been seen by a medical provider, you will be either: o Tested for (COVID-19) and discharged home on quarantine except to seek medical care if symptoms worsen, and asked to  - Stay home and avoid contact with others until you get your results (4-5 days)  - Avoid travel on public transportation if possible (such as bus, train, or airplane) or o Sent to the Emergency Department by EMS for evaluation, COVID-19 testing, and possible  admission depending on your condition and test results.  What to do if you are LOW RISK for COVID-19?  Reduce your risk of any infection by using the same precautions used for avoiding the common cold or flu:  . Wash your hands often with soap and warm water for at least 20 seconds.  If soap and water are not readily available, use an alcohol-based hand sanitizer with at least 60% alcohol.  . If coughing or sneezing, cover your mouth and nose by coughing or sneezing into the elbow areas of your shirt or coat, into a tissue or into your sleeve (not your hands). . Avoid shaking hands with others and consider head nods or verbal greetings only. . Avoid touching your eyes, nose, or mouth with unwashed hands.  . Avoid close contact with people who are sick. . Avoid places or events with large numbers of people in one location, like concerts or sporting events. . Carefully consider travel plans you have or are making. . If you are planning any travel outside or inside the [place], visit the CDC's Travelers' Health webpage for the latest health notices. . If you have some symptoms but not all symptoms, continue to monitor at home and seek medical attention if your symptoms worsen. . If you are having a medical emergency, call 911.   ADDITIONAL HEALTHCARE OPTIONS FOR PATIENTS  [facility] / e-Visit: [email or web address]         [facility]: [phone]  [facility]: [phone]                   [facility]: [phone]   

## 2019-03-04 NOTE — Progress Notes (Signed)
Radiation Oncology         [phone]) [phone] ________________________________  Name: James Byrd MRN: [id number]  Date: 03/04/2019  DOB: [birth year]  Follow-Up Visit Note  CC: Street, Sharon [place], MD  [address], [place], *    ICD-10-CM   1. Malignant melanoma of right great toe Stage IIIB [phone]  C43.71     Diagnosis:   Stage IIIB (T3bN1aM0) subungual melanoma of the right hallux -- nodal recurrence in the RIGHT inguinal nodes  Interval Since Last Radiation:  5 weeks  12/27/2018 through 01/29/2019 Site Technique Total Dose Dose per Fx Completed Fx Beam Energies  Extremity: Ext_Rt_rt ingu 3D 48/48 2.4 20/20 6X, 10X    Narrative:  The patient returns today for routine follow-up. He last saw Dr. Marin Olp on 02/18/2019.    Patient is somewhat down after recently losing his brother to cancer. He reports numb sensation in right groin in the radiation field. He denies dysuria or hematuria, pain, and any skin concerns to the radiation field.  ALLERGIES:  has No Known Allergies.  Meds: Current Outpatient Medications  Medication Sig Dispense Refill  . Ascorbic Acid (VITAMIN C) 1000 MG tablet Take 1,000 mg by mouth daily.    Marland Kitchen augmented betamethasone dipropionate (DIPROLENE-AF) 0.05 % cream Apply 1 application topically 2 (two) times daily as needed (psoriasis).     . Calcium Carb-Cholecalciferol (CALCIUM 600 + D PO) Take 1 tablet by mouth at bedtime.    . Cholecalciferol (VITAMIN D) 2000 units tablet Take 2,000 Units by mouth daily.    . diclofenac sodium (VOLTAREN) 1 % GEL Apply 2 g topically as needed (pain).     . fludrocortisone (FLORINEF) 0.1 MG tablet Take 0.2 mg by mouth daily.     Marland Kitchen gabapentin (NEURONTIN) 600 MG tablet Take 600 mg by mouth 2 (two) times daily. Taking twice daily    . GLUCOSAMINE-CHONDROITIN PO Take 1 tablet by mouth daily.    . hydrocortisone (CORTEF) 5 MG tablet Take 1 tablet (5 mg total) by mouth 2 (two) times daily. 90 tablet 0  . loperamide (IMODIUM) 2 MG  capsule Take 2 capsules (4 mg total) by mouth as needed for diarrhea or loose stools. 30 capsule 0  . Multiple Vitamin (MULTIVITAMIN) tablet Take 1 tablet by mouth daily.    . SUPER B COMPLEX/C PO Take 1 tablet by mouth daily.    . tadalafil (CIALIS) 10 MG tablet Take 1 tablet 30 min prior to sexual activity 10 tablet 0  . testosterone (ANDROGEL) 50 MG/5GM (1%) GEL Place 5 g onto the skin daily. 5 g 0  . levofloxacin (LEVAQUIN) 500 MG tablet Take 1 tablet (500 mg total) by mouth daily for 14 days. (Patient not taking: Reported on 03/04/2019) 14 tablet 5  . potassium & sodium phosphates (PHOS-NAK) 280-160-250 MG PACK Take 1 packet by mouth 4 (four) times daily -  with meals and at bedtime. (Patient not taking: Reported on 03/04/2019) 120 each    No current facility-administered medications for this encounter.    Facility-Administered Medications Ordered in Other Encounters  Medication Dose Route Frequency Provider Last Rate Last Dose  . 0.9 %  sodium chloride infusion   Intravenous Continuous [name], MD 500 mL/hr at 02/05/15 1510      Physical Findings: The patient is in no acute distress. Patient is alert and oriented.  height is 6' (1.829 m) and weight is 228 lb 4 oz (103.5 kg). His temporal temperature is 97.8 F (36.6 C). His blood  pressure is 104/77 and his pulse is 89. His respiration is 18 and oxygen saturation is 97%. .  No significant changes. Lungs are clear to auscultation bilaterally. Heart has regular rate and rhythm. No palpable cervical, supraclavicular, or axillary adenopathy. Abdomen soft, non-tender, normal bowel sounds. Examination of the right inguinal area had hyperpigmentation changes.  No palpable adenopathy in the right inguinal area or femoral region. left inguinal area is free of adenopathy.  Lab Findings: Lab Results  Component Value Date   WBC 4.0 02/18/2019   HGB 14.6 02/18/2019   HCT 42.8 02/18/2019   MCV 87.7 02/18/2019   PLT 142 (L) 02/18/2019     Radiographic Findings: No results found.  Impression:  The patient is recovering from the effects of radiation.  Clinically stable. No evidence of recurrence on physical exam today.  Plan:  PRN follow up in radiation oncology. Patient will continue on close follow up with medical oncology and continue on  immunotherapy. PET scan to be scheduled in the next couple of weeks by Dr. Marin Olp.  ____________________________________ Gery Pray, MD   This document serves as a record of services personally performed by Gery Pray, MD. It was created on his behalf by Wilburn Mylar, a trained medical scribe. The creation of this record is based on the scribe's personal observations and the provider's statements to them. This document has been checked and approved by the attending provider.

## 2019-03-04 NOTE — Progress Notes (Signed)
Pt presents today for f/u with Dr. Sondra Come. Pt denies c/o pain. Pt reports numb sensation in right groin in radiation field. Pt denies dysuria/hematuria. Pt denies skin concerns in radiation field other than some hyperpigmentation.   BP 104/77 (BP Location: Left Arm, Patient Position: Sitting)   Pulse 89   Temp 97.8 F (36.6 C) (Temporal)   Resp 18   Ht 6' (1.829 m)   Wt 228 lb 4 oz (103.5 kg)   SpO2 97%   BMI 30.96 kg/m   Wt Readings from Last 3 Encounters:  03/04/19 228 lb 4 oz (103.5 kg)  02/18/19 234 lb 12.8 oz (106.5 kg)  01/16/19 249 lb 1.9 oz (113 kg)   Loma Sousa, RN BSN

## 2019-03-07 ENCOUNTER — Other Ambulatory Visit: Payer: Self-pay | Admitting: *Deleted

## 2019-03-07 ENCOUNTER — Telehealth: Payer: Self-pay | Admitting: *Deleted

## 2019-03-07 DIAGNOSIS — C4371 Malignant melanoma of right lower limb, including hip: Secondary | ICD-10-CM

## 2019-03-07 MED ORDER — HYDROCORTISONE 5 MG PO TABS: 5.0000 mg | ORAL_TABLET | Freq: Two times a day (BID) | ORAL | 0 refills | Status: DC

## 2019-03-07 MED ORDER — TESTOSTERONE 50 MG/5GM (1%) TD GEL: 5.0000 g | Freq: Every day | TRANSDERMAL | 0 refills | Status: DC

## 2019-03-07 NOTE — Telephone Encounter (Signed)
Call received this AM from patients wife requesting a refill for Testosterone gel be sent to [facility] and for Cortef prescription to be increased to two tablets twice a day with a 90 day supply.  Dr. Marin Olp notified and would like for pt to continue taking Cortef 5 mg (one tablet) twice a day.  Call placed back to patient's wife and notified her of MD orders regarding Cortef dose.  Pt.'s wife requests 90 day supply be sent in to [facility] in [place].  Dr. Marin Olp notified.

## 2019-03-15 ENCOUNTER — Encounter: Payer: Self-pay | Admitting: Hematology & Oncology

## 2019-03-15 ENCOUNTER — Telehealth: Payer: Self-pay | Admitting: Hematology & Oncology

## 2019-03-15 ENCOUNTER — Inpatient Hospital Stay: Payer: PPO

## 2019-03-15 ENCOUNTER — Other Ambulatory Visit: Payer: Self-pay

## 2019-03-15 ENCOUNTER — Other Ambulatory Visit: Payer: PPO

## 2019-03-15 ENCOUNTER — Inpatient Hospital Stay: Payer: PPO | Attending: Hematology & Oncology | Admitting: Hematology & Oncology

## 2019-03-15 VITALS — BP 109/76 | HR 92 | Temp 97.6°F | Resp 18 | Wt 226.1 lb

## 2019-03-15 DIAGNOSIS — C4371 Malignant melanoma of right lower limb, including hip: Secondary | ICD-10-CM

## 2019-03-15 DIAGNOSIS — Z5112 Encounter for antineoplastic immunotherapy: Secondary | ICD-10-CM | POA: Insufficient documentation

## 2019-03-15 DIAGNOSIS — C774 Secondary and unspecified malignant neoplasm of inguinal and lower limb lymph nodes: Secondary | ICD-10-CM | POA: Insufficient documentation

## 2019-03-15 DIAGNOSIS — E2740 Unspecified adrenocortical insufficiency: Secondary | ICD-10-CM

## 2019-03-15 DIAGNOSIS — Z79899 Other long term (current) drug therapy: Secondary | ICD-10-CM | POA: Insufficient documentation

## 2019-03-15 LAB — CBC WITH DIFFERENTIAL (CANCER CENTER ONLY)
Abs Immature Granulocytes: 0.02 10*3/uL (ref 0.00–0.07)
Basophils Absolute: 0 10*3/uL (ref 0.0–0.1)
Basophils Relative: 1 %
Eosinophils Absolute: 0.2 10*3/uL (ref 0.0–0.5)
Eosinophils Relative: 4 %
HCT: 47.5 % (ref 39.0–52.0)
Hemoglobin: 16.1 g/dL (ref 13.0–17.0)
Immature Granulocytes: 1 %
Lymphocytes Relative: 30 %
Lymphs Abs: 1.3 10*3/uL (ref 0.7–4.0)
MCH: 29.7 pg (ref 26.0–34.0)
MCHC: 33.9 g/dL (ref 30.0–36.0)
MCV: 87.6 fL (ref 80.0–100.0)
Monocytes Absolute: 0.3 10*3/uL (ref 0.1–1.0)
Monocytes Relative: 6 %
Neutro Abs: 2.5 10*3/uL (ref 1.7–7.7)
Neutrophils Relative %: 58 %
Platelet Count: 197 10*3/uL (ref 150–400)
RBC: 5.42 MIL/uL (ref 4.22–5.81)
RDW: 12.7 % (ref 11.5–15.5)
WBC Count: 4.3 10*3/uL (ref 4.0–10.5)
nRBC: 0 % (ref 0.0–0.2)

## 2019-03-15 LAB — CMP (CANCER CENTER ONLY)
ALT: 9 U/L (ref 0–44)
AST: 13 U/L — ABNORMAL LOW (ref 15–41)
Albumin: 4.8 g/dL (ref 3.5–5.0)
Alkaline Phosphatase: 61 U/L (ref 38–126)
Anion gap: 9 (ref 5–15)
BUN: 24 mg/dL — ABNORMAL HIGH (ref 6–20)
CO2: 24 mmol/L (ref 22–32)
Calcium: 10.1 mg/dL (ref 8.9–10.3)
Chloride: 107 mmol/L (ref 98–111)
Creatinine: 1.13 mg/dL (ref 0.61–1.24)
GFR, Est AFR Am: >60 mL/min (ref >60)
GFR, Estimated: >60 mL/min (ref >60)
Glucose, Bld: 113 mg/dL — ABNORMAL HIGH (ref 70–99)
Potassium: 3.8 mmol/L (ref 3.5–5.1)
Sodium: 140 mmol/L (ref 135–145)
Total Bilirubin: 0.5 mg/dL (ref 0.3–1.2)
Total Protein: 8.1 g/dL (ref 6.5–8.1)

## 2019-03-15 LAB — LACTATE DEHYDROGENASE: LDH: 188 U/L (ref 98–192)

## 2019-03-15 LAB — TSH: TSH: 3.249 u[IU]/mL (ref 0.320–4.118)

## 2019-03-15 MED ORDER — LEVOFLOXACIN 500 MG PO TABS: 500.0000 mg | ORAL_TABLET | Freq: Every day | ORAL | 6 refills | Status: DC

## 2019-03-15 MED ORDER — SODIUM CHLORIDE 0.9% FLUSH
10.0000 mL | INTRAVENOUS | Status: DC | PRN
  Filled 2019-03-15: qty 10

## 2019-03-15 MED ORDER — HEPARIN SOD (PORK) LOCK FLUSH 100 UNIT/ML IV SOLN
500.0000 [IU] | Freq: Once | INTRAVENOUS | Status: DC | PRN
  Filled 2019-03-15: qty 5

## 2019-03-15 MED ORDER — SODIUM CHLORIDE 0.9 % IV SOLN
Freq: Once | INTRAVENOUS | Status: AC
  Administered 2019-03-15: 12:00:00 via INTRAVENOUS
  Filled 2019-03-15: qty 250

## 2019-03-15 MED ORDER — SODIUM CHLORIDE 0.9 % IV SOLN
480.0000 mg | Freq: Once | INTRAVENOUS | Status: AC
  Administered 2019-03-15: 480 mg via INTRAVENOUS
  Filled 2019-03-15: qty 48

## 2019-03-15 NOTE — Progress Notes (Signed)
Hematology and Oncology Follow Up Visit  James Byrd [id number] 08-02-1965 53 y.o. 03/15/2019   Principle Diagnosis:  Stage IIIB (T3bN1aM0) subungual melanoma of the right hallux -- nodal recurrence in the RIGHT inguinal nodes -- BRAF wt  Current Therapy:   Yervoy-status post 3 cycles - discontinued in September 2016 secondary to toxicity  XRT to the RIGHT inguinal basin Nivolumab 400 mg q month -- s/p cycle #2 -start in 12/2018   Interim History:  James Byrd is here today for follow-up.  He did much better with this last cycle of nivolumab.,  Sure why he would do better.  We did have him on some Levaquin as a prophylactic measure.  He did not have any problems with cough.  He did not have any problems with nausea or vomiting.  He is on hydrocortisone.  I am still not sure as to why he is on hydrocortisone.  I am trying to get him off the hydrocortisone.  He only takes it once a day now.  He has had no issues with fever.  He has had no issues with swallowing.  His appetite is been doing fairly well.  He has had no change in bowel or bladder habits.  There is been no rashes.  He has had no leg swelling.  Overall, his performance status is ECOG 1.    Allergies: No Known Allergies  Past Medical History, Surgical history, Social history, and Family History were reviewed and updated.  Review of Systems: Review of Systems  Constitutional: Positive for malaise/fatigue.  HENT: Negative.   Eyes: Negative.   Respiratory: Positive for shortness of breath.   Cardiovascular: Negative.   Gastrointestinal: Positive for constipation and nausea.  Genitourinary: Negative.   Musculoskeletal: Positive for joint pain and myalgias.  Skin: Negative.   Neurological: Positive for tingling and focal weakness.  Endo/Heme/Allergies: Negative.   Psychiatric/Behavioral: Positive for memory loss.     Physical Exam:  weight is 226 lb 1 oz (102.5 kg). His temporal temperature is 97.6 F (36.4  C). His blood pressure is 109/76 and his pulse is 92. His respiration is 18 and oxygen saturation is 100%.   Wt Readings from Last 3 Encounters:  03/15/19 226 lb 1 oz (102.5 kg)  03/04/19 228 lb 4 oz (103.5 kg)  02/18/19 234 lb 12.8 oz (106.5 kg)    Physical Exam Vitals signs reviewed.  HENT:     Head: Normocephalic and atraumatic.  Eyes:     Pupils: Pupils are equal, round, and reactive to light.  Neck:     Musculoskeletal: Normal range of motion.  Cardiovascular:     Rate and Rhythm: Normal rate and regular rhythm.     Heart sounds: Normal heart sounds.  Pulmonary:     Effort: Pulmonary effort is normal.     Breath sounds: Normal breath sounds.  Abdominal:     General: Bowel sounds are normal.     Palpations: Abdomen is soft.     Comments: Abdominal exam shows a soft abdomen.  Bowel sounds are present.  There is no palpable abdominal mass.  There is no palpable liver or spleen tip.  He has a healing right inguinal lymphadenectomy scar.  I cannot palpate any obvious adenopathy.  The left inguinal area is unremarkable.    Musculoskeletal: Normal range of motion.        General: No tenderness or deformity.     Comments: The big toe on the right foot is amputated.  There is no lymphedema  in the right leg.  He has good pulses in his distal extremities.  Left leg is unremarkable.  Lymphadenopathy:     Cervical: No cervical adenopathy.  Skin:    General: Skin is warm and dry.     Findings: No erythema or rash.  Neurological:     Mental Status: He is alert and oriented to person, place, and time.  Psychiatric:        Behavior: Behavior normal.        Thought Content: Thought content normal.        Judgment: Judgment normal.      Lab Results  Component Value Date   WBC 4.3 03/15/2019   HGB 16.1 03/15/2019   HCT 47.5 03/15/2019   MCV 87.6 03/15/2019   PLT 197 03/15/2019   Lab Results  Component Value Date   FERRITIN 363 (H) 09/04/2015   IRON 55 09/04/2015   TIBC 200  (L) 09/04/2015   UIBC 145 09/04/2015   IRONPCTSAT 28 09/04/2015   Lab Results  Component Value Date   RBC 5.42 03/15/2019   No results found for: KPAFRELGTCHN, LAMBDASER, KAPLAMBRATIO No results found for: Kandis Cocking, IGMSERUM No results found for: Odetta Pink, SPEI   Chemistry      Component Value Date/Time   NA 140 03/15/2019 0920   NA 145 06/06/2017 1316   NA 141 10/16/2015 1334   K 3.8 03/15/2019 0920   K 4.1 06/06/2017 1316   K 3.9 10/16/2015 1334   CL 107 03/15/2019 0920   CL 108 06/06/2017 1316   CO2 24 03/15/2019 0920   CO2 26 06/06/2017 1316   CO2 21 (L) 10/16/2015 1334   BUN 24 (H) 03/15/2019 0920   BUN 19 06/06/2017 1316   BUN 20.0 10/16/2015 1334   CREATININE 1.13 03/15/2019 0920   CREATININE 1.0 06/06/2017 1316   CREATININE 1.0 10/16/2015 1334      Component Value Date/Time   CALCIUM 10.1 03/15/2019 0920   CALCIUM 9.4 06/06/2017 1316   CALCIUM 9.7 10/16/2015 1334   ALKPHOS 61 03/15/2019 0920   ALKPHOS 78 06/06/2017 1316   ALKPHOS 62 10/16/2015 1334   AST 13 (L) 03/15/2019 0920   AST 16 10/16/2015 1334   ALT 9 03/15/2019 0920   ALT 27 06/06/2017 1316   ALT 11 10/16/2015 1334   BILITOT 0.5 03/15/2019 0920   BILITOT 0.49 10/16/2015 1334      Impression and Plan: James Byrd is a very pleasant 53 yo caucasian gentleman with history of stage IIIB melanoma of the right hallux that was subungual with one microscopic positive inguinal lymph node. He completed 3 cycles of Yervoy but stopped due to side effects.   We will go ahead and proceed with his third cycle of nivolumab today.  IHowever, I will continue to have him on some prophylactic antibiotic.  Apparently, according to his wife, whenever he mows the yard, he gets sick.  I would tell him not to mow the yard but I know this is something that he enjoys and I want to make sure that his quality of life is good.  I will still try to push through and  give him the full course of nivolumab.  We will get him back in 1 more month.  I spent about 40 minutes with he and his wife today.  I had answered all of their questions.  I reviewed the lab work with him.  I will certainly pray hard  for him as he is having a tough time with the passing of his brother.     [name], MD 9/25/202010:45 AM

## 2019-03-15 NOTE — Patient Instructions (Signed)
[facility] Discharge Instructions for Patients Receiving Chemotherapy  Today you received the following chemotherapy agents:  Nivolumab  To help prevent nausea and vomiting after your treatment, we encourage you to take your nausea medication as ordered per MD.    If you develop nausea and vomiting that is not controlled by your nausea medication, call the clinic.   BELOW ARE SYMPTOMS THAT SHOULD BE REPORTED IMMEDIATELY:  *FEVER GREATER THAN 100.5 F  *CHILLS WITH OR WITHOUT FEVER  NAUSEA AND VOMITING THAT IS NOT CONTROLLED WITH YOUR NAUSEA MEDICATION  *UNUSUAL SHORTNESS OF BREATH  *UNUSUAL BRUISING OR BLEEDING  TENDERNESS IN MOUTH AND THROAT WITH OR WITHOUT PRESENCE OF ULCERS  *URINARY PROBLEMS  *BOWEL PROBLEMS  UNUSUAL RASH Items with * indicate a potential emergency and should be followed up as soon as possible.  Feel free to call the clinic should you have any questions or concerns. The clinic phone number is [phone].  Please show the CHEMO ALERT CARD at check-in to the Emergency Department and triage nurse.   

## 2019-03-15 NOTE — Telephone Encounter (Signed)
lmom for 10/23 appt at 1030 am per 9/25 LOS

## 2019-03-16 LAB — TESTOSTERONE: Testosterone: 1188 ng/dL — ABNORMAL HIGH (ref 264–916)

## 2019-03-18 ENCOUNTER — Encounter: Payer: Self-pay | Admitting: *Deleted

## 2019-03-19 DIAGNOSIS — E669 Obesity, unspecified: Secondary | ICD-10-CM | POA: Diagnosis not present

## 2019-03-19 DIAGNOSIS — E271 Primary adrenocortical insufficiency: Secondary | ICD-10-CM | POA: Diagnosis not present

## 2019-03-19 DIAGNOSIS — Z8619 Personal history of other infectious and parasitic diseases: Secondary | ICD-10-CM | POA: Diagnosis not present

## 2019-03-19 DIAGNOSIS — Z0000 Encounter for general adult medical examination without abnormal findings: Secondary | ICD-10-CM | POA: Diagnosis not present

## 2019-03-19 DIAGNOSIS — E782 Mixed hyperlipidemia: Secondary | ICD-10-CM | POA: Diagnosis not present

## 2019-03-19 DIAGNOSIS — C774 Secondary and unspecified malignant neoplasm of inguinal and lower limb lymph nodes: Secondary | ICD-10-CM | POA: Diagnosis not present

## 2019-03-19 DIAGNOSIS — E291 Testicular hypofunction: Secondary | ICD-10-CM | POA: Diagnosis not present

## 2019-03-19 DIAGNOSIS — Z6830 Body mass index (BMI) 30.0-30.9, adult: Secondary | ICD-10-CM | POA: Diagnosis not present

## 2019-03-19 DIAGNOSIS — C4371 Malignant melanoma of right lower limb, including hip: Secondary | ICD-10-CM | POA: Diagnosis not present

## 2019-03-19 DIAGNOSIS — G629 Polyneuropathy, unspecified: Secondary | ICD-10-CM | POA: Diagnosis not present

## 2019-03-19 DIAGNOSIS — Z8701 Personal history of pneumonia (recurrent): Secondary | ICD-10-CM | POA: Diagnosis not present

## 2019-03-19 DIAGNOSIS — I9589 Other hypotension: Secondary | ICD-10-CM | POA: Diagnosis not present

## 2019-03-22 ENCOUNTER — Other Ambulatory Visit: Payer: Self-pay | Admitting: *Deleted

## 2019-03-22 DIAGNOSIS — C4371 Malignant melanoma of right lower limb, including hip: Secondary | ICD-10-CM

## 2019-03-22 MED ORDER — TESTOSTERONE 50 MG/5GM (1%) TD GEL: 5.0000 g | Freq: Every day | TRANSDERMAL | 2 refills | Status: DC

## 2019-03-28 ENCOUNTER — Ambulatory Visit: Payer: PPO

## 2019-03-28 ENCOUNTER — Other Ambulatory Visit: Payer: PPO

## 2019-03-28 ENCOUNTER — Ambulatory Visit: Payer: PPO | Admitting: Hematology & Oncology

## 2019-03-29 ENCOUNTER — Telehealth: Payer: Self-pay

## 2019-03-29 ENCOUNTER — Other Ambulatory Visit: Payer: Self-pay

## 2019-03-29 DIAGNOSIS — C4371 Malignant melanoma of right lower limb, including hip: Secondary | ICD-10-CM

## 2019-03-29 MED ORDER — FLUDROCORTISONE ACETATE 0.1 MG PO TABS: 0.2000 mg | ORAL_TABLET | Freq: Every day | ORAL | 0 refills | Status: DC

## 2019-03-29 NOTE — Telephone Encounter (Signed)
Received call from pt's wife stating they called the on call service last pm d/t hypotension, lightheadedness and dizziness. States his pressure was 92/72 with HR 101, then 96/73 with HR 96 and later 98/68. States the on call MD instructed them to push fluids. Pt drank 3 bottles of water and felt better. States pt feels better today and his last measured BP was 114/71; HR 80. Questions if Dr Marin Olp "can do anything to keep that from happening again?"  Per Dr Marin Olp, script ordered for Florinef 1.2mg  daily. Carolyn aware and verbalizes understanding. dph

## 2019-04-05 DIAGNOSIS — R590 Localized enlarged lymph nodes: Secondary | ICD-10-CM | POA: Diagnosis not present

## 2019-04-05 DIAGNOSIS — C4371 Malignant melanoma of right lower limb, including hip: Secondary | ICD-10-CM | POA: Diagnosis not present

## 2019-04-05 DIAGNOSIS — I770 Arteriovenous fistula, acquired: Secondary | ICD-10-CM | POA: Diagnosis not present

## 2019-04-08 ENCOUNTER — Telehealth: Payer: Self-pay

## 2019-04-08 NOTE — Telephone Encounter (Signed)
Received call from pt's wife stating that patient is having "a lot, a lot, a lot of pain in his legs." States that pt has been in tears d/t pain. They have tried hot baths and CBD lotion. Hoyle Sauer states pt is trying to taper of Neurontin, prescribed by Dr Posey Pronto, neurology.   Attempted to get more details related to type of pain. Hoyle Sauer put patient on speakerphone who states the pain is "more intense" in both legs & "from my toes to my eyes." When trying to determine type of pain patient states "excruciating." Questioned about pins and needles vs sharp vs achy vs burning pain, pt states "it started sharp but now it is dull." Pt states he has decreased his Neurontin from 600mg  bid to 600mg  in am and 300mg  qhs. Pt then states that he took 1800mg  of Neurontin this am and another 600mg  prior to this call. Hoyle Sauer then says that Trayvion took a Tramadol from a previous surgery early this morning and when that didn't help, he took two more. Labarron then states he has been taking Oxycodone at night. None of these meds have been prescribed by this office & neither Tramadol nor Oxycodone are on pt's medication list.   Discussed with Judson Roch, NP in Dr Antonieta Pert absence. Per Judson Roch, pt needs to return to prescribed dose of Neurontin and contact neurology for advise in changing dose. If unable to get relief from pain or appt with neurology, pt to go to ED.   Called Hoyle Sauer back to report this. Educated her on the dangers of taking medication outside of how it is prescribed or taking others' meds. She states "I know but he won't listen." States pt is currently at dentist office but "I will try to make him understand."  dph

## 2019-04-12 ENCOUNTER — Inpatient Hospital Stay (HOSPITAL_BASED_OUTPATIENT_CLINIC_OR_DEPARTMENT_OTHER): Payer: PPO | Admitting: Family

## 2019-04-12 ENCOUNTER — Other Ambulatory Visit: Payer: PPO

## 2019-04-12 ENCOUNTER — Other Ambulatory Visit: Payer: Self-pay

## 2019-04-12 ENCOUNTER — Encounter: Payer: Self-pay | Admitting: Family

## 2019-04-12 ENCOUNTER — Ambulatory Visit: Payer: PPO

## 2019-04-12 ENCOUNTER — Inpatient Hospital Stay: Payer: PPO | Attending: Hematology & Oncology

## 2019-04-12 ENCOUNTER — Inpatient Hospital Stay: Payer: PPO

## 2019-04-12 VITALS — BP 109/74 | HR 78 | Temp 97.8°F | Resp 19 | Wt 226.0 lb

## 2019-04-12 DIAGNOSIS — Z79899 Other long term (current) drug therapy: Secondary | ICD-10-CM | POA: Insufficient documentation

## 2019-04-12 DIAGNOSIS — C774 Secondary and unspecified malignant neoplasm of inguinal and lower limb lymph nodes: Secondary | ICD-10-CM | POA: Diagnosis not present

## 2019-04-12 DIAGNOSIS — C4371 Malignant melanoma of right lower limb, including hip: Secondary | ICD-10-CM | POA: Diagnosis not present

## 2019-04-12 DIAGNOSIS — G620 Drug-induced polyneuropathy: Secondary | ICD-10-CM | POA: Diagnosis not present

## 2019-04-12 DIAGNOSIS — E032 Hypothyroidism due to medicaments and other exogenous substances: Secondary | ICD-10-CM

## 2019-04-12 DIAGNOSIS — Z5112 Encounter for antineoplastic immunotherapy: Secondary | ICD-10-CM | POA: Diagnosis not present

## 2019-04-12 DIAGNOSIS — T451X5A Adverse effect of antineoplastic and immunosuppressive drugs, initial encounter: Secondary | ICD-10-CM

## 2019-04-12 LAB — CMP (CANCER CENTER ONLY)
ALT: 9 U/L (ref 0–44)
AST: 14 U/L — ABNORMAL LOW (ref 15–41)
Albumin: 4.6 g/dL (ref 3.5–5.0)
Alkaline Phosphatase: 57 U/L (ref 38–126)
Anion gap: 8 (ref 5–15)
BUN: 16 mg/dL (ref 6–20)
CO2: 24 mmol/L (ref 22–32)
Calcium: 9.5 mg/dL (ref 8.9–10.3)
Chloride: 110 mmol/L (ref 98–111)
Creatinine: 0.96 mg/dL (ref 0.61–1.24)
GFR, Est AFR Am: >60 mL/min (ref >60)
GFR, Estimated: >60 mL/min (ref >60)
Glucose, Bld: 98 mg/dL (ref 70–99)
Potassium: 3.5 mmol/L (ref 3.5–5.1)
Sodium: 142 mmol/L (ref 135–145)
Total Bilirubin: 0.7 mg/dL (ref 0.3–1.2)
Total Protein: 7.2 g/dL (ref 6.5–8.1)

## 2019-04-12 LAB — CBC WITH DIFFERENTIAL (CANCER CENTER ONLY)
Abs Immature Granulocytes: 0.01 10*3/uL (ref 0.00–0.07)
Basophils Absolute: 0 10*3/uL (ref 0.0–0.1)
Basophils Relative: 1 %
Eosinophils Absolute: 0.2 10*3/uL (ref 0.0–0.5)
Eosinophils Relative: 4 %
HCT: 41.1 % (ref 39.0–52.0)
Hemoglobin: 14.3 g/dL (ref 13.0–17.0)
Immature Granulocytes: 0 %
Lymphocytes Relative: 26 %
Lymphs Abs: 0.9 10*3/uL (ref 0.7–4.0)
MCH: 30 pg (ref 26.0–34.0)
MCHC: 34.8 g/dL (ref 30.0–36.0)
MCV: 86.2 fL (ref 80.0–100.0)
Monocytes Absolute: 0.3 10*3/uL (ref 0.1–1.0)
Monocytes Relative: 9 %
Neutro Abs: 2.2 10*3/uL (ref 1.7–7.7)
Neutrophils Relative %: 60 %
Platelet Count: 169 10*3/uL (ref 150–400)
RBC: 4.77 MIL/uL (ref 4.22–5.81)
RDW: 12.6 % (ref 11.5–15.5)
WBC Count: 3.6 10*3/uL — ABNORMAL LOW (ref 4.0–10.5)
nRBC: 0 % (ref 0.0–0.2)

## 2019-04-12 MED ORDER — OXYCODONE-ACETAMINOPHEN 5-325 MG PO TABS: 1.0000 | ORAL_TABLET | Freq: Four times a day (QID) | ORAL | 0 refills | Status: DC | PRN

## 2019-04-12 MED ORDER — SODIUM CHLORIDE 0.9 % IV SOLN
Freq: Once | INTRAVENOUS | Status: AC
  Administered 2019-04-12: 12:00:00 via INTRAVENOUS
  Filled 2019-04-12: qty 250

## 2019-04-12 MED ORDER — SODIUM CHLORIDE 0.9 % IV SOLN
480.0000 mg | Freq: Once | INTRAVENOUS | Status: AC
  Administered 2019-04-12: 480 mg via INTRAVENOUS
  Filled 2019-04-12: qty 48

## 2019-04-12 NOTE — Patient Instructions (Signed)
[facility] Discharge Instructions for Patients Receiving Chemotherapy  Today you received the following chemotherapy agents:  Nivolumab.  To help prevent nausea and vomiting after your treatment, we encourage you to take your nausea medication as directed.   If you develop nausea and vomiting that is not controlled by your nausea medication, call the clinic.   BELOW ARE SYMPTOMS THAT SHOULD BE REPORTED IMMEDIATELY:  *FEVER GREATER THAN 100.5 F  *CHILLS WITH OR WITHOUT FEVER  NAUSEA AND VOMITING THAT IS NOT CONTROLLED WITH YOUR NAUSEA MEDICATION  *UNUSUAL SHORTNESS OF BREATH  *UNUSUAL BRUISING OR BLEEDING  TENDERNESS IN MOUTH AND THROAT WITH OR WITHOUT PRESENCE OF ULCERS  *URINARY PROBLEMS  *BOWEL PROBLEMS  UNUSUAL RASH Items with * indicate a potential emergency and should be followed up as soon as possible.  Feel free to call the clinic you have any questions or concerns. The clinic phone number is [phone].  Please show the CHEMO ALERT CARD at check-in to the Emergency Department and triage nurse.   

## 2019-04-12 NOTE — Progress Notes (Signed)
Hematology and Oncology Follow Up Visit  James Byrd [id number] 28-Jun-1965 53 y.o. 04/12/2019   Principle Diagnosis:  Stage IIIB (T3bN1aM0) subungual melanoma of the right hallux -- nodal recurrence in the RIGHT inguinal nodes -- BRAF wt  Past Therapy: Yervoy-status post 3 cycles - discontinued in September 2016 secondary to toxicity  Current Therapy:   XRT to the RIGHT inguinal basin Nivolumab 400 mg q month - started 01/03/2019, s/p cycle 3   Interim History:  James Byrd is here today for follow-up and treatment. He is doing fairly well but still has the neuropathy in the legs and hands.  He had tried to ween his dose of Neurontin down but this just was not a good idea for him. His pain increased and he has had issues getting it under control. He is back on his prescribed Neurontin regimen.  We discussed adding oxycodone for breakthrough pain. He really does not like to take prescription medications but would like to have this on hand just in case.  He is scheduled to have a follow-up PET scan on 04/30/2019.  He will have occasional short (seconds) episodes of dizziness.  No fever, chills, n/v, cough, rash, SOB, chest pain, palpitations, abdominal pain or changes in bowel or bladder habits.  He denies any episodes of bleeding. No bruising or petechiae.  No swelling in his extremities at this time.  He is eating well but admits that he needs to better hydrate throughout the day.  His weight is stable at 226 lbs.   ECOG Performance Status: 1 - Symptomatic but completely ambulatory  Medications:  Allergies as of 04/12/2019   No Known Allergies     Medication List       Accurate as of April 12, 2019 11:23 AM. If you have any questions, ask your nurse or doctor.        augmented betamethasone dipropionate 0.05 % cream Commonly known as: DIPROLENE-AF Apply 1 application topically 2 (two) times daily as needed (psoriasis).   CALCIUM 600 + D PO Take 1 tablet by mouth at  bedtime.   diclofenac sodium 1 % Gel Commonly known as: VOLTAREN Apply 2 g topically as needed (pain).   fludrocortisone 0.1 MG tablet Commonly known as: FLORINEF Take 2 tablets (0.2 mg total) by mouth daily.   gabapentin 600 MG tablet Commonly known as: NEURONTIN Take 600 mg by mouth 2 (two) times daily. Taking twice daily   GLUCOSAMINE-CHONDROITIN PO Take 1 tablet by mouth daily.   hydrocortisone 5 MG tablet Commonly known as: Cortef Take 1 tablet (5 mg total) by mouth 2 (two) times daily.   levofloxacin 500 MG tablet Commonly known as: Levaquin Take 1 tablet (500 mg total) by mouth daily.   loperamide 2 MG capsule Commonly known as: IMODIUM Take 2 capsules (4 mg total) by mouth as needed for diarrhea or loose stools.   multivitamin tablet Take 1 tablet by mouth daily.   potassium & sodium phosphates 280-160-250 MG Pack Commonly known as: PHOS-NAK Take 1 packet by mouth 4 (four) times daily -  with meals and at bedtime.   SUPER B COMPLEX/C PO Take 1 tablet by mouth daily.   tadalafil 10 MG tablet Commonly known as: Cialis Take 1 tablet 30 min prior to sexual activity   testosterone 50 MG/5GM (1%) Gel Commonly known as: ANDROGEL Place 5 g onto the skin daily. Send 90 day supply. Dose 1%.   vitamin C 1000 MG tablet Take 1,000 mg by mouth daily.  Vitamin D 50 MCG (2000 UT) tablet Take 2,000 Units by mouth daily.       Allergies: No Known Allergies  Past Medical History, Surgical history, Social history, and Family History were reviewed and updated.  Review of Systems: All other 10 point review of systems is negative.   Physical Exam:  weight is 226 lb (102.5 kg). His temporal temperature is 97.8 F (36.6 C). His blood pressure is 109/74 and his pulse is 78. His respiration is 19 and oxygen saturation is 99%.   Wt Readings from Last 3 Encounters:  04/12/19 226 lb (102.5 kg)  03/15/19 226 lb 1 oz (102.5 kg)  03/04/19 228 lb 4 oz (103.5 kg)     Ocular: Sclerae unicteric, pupils equal, round and reactive to light Ear-nose-throat: Oropharynx clear, dentition fair Lymphatic: No cervical or supraclavicular adenopathy Lungs no rales or rhonchi, good excursion bilaterally Heart regular rate and rhythm, no murmur appreciated Abd soft, nontender, positive bowel sounds, no liver or spleen tip palpated on exam, no fluid wave  MSK no focal spinal tenderness, no joint edema Neuro: non-focal, well-oriented, appropriate affect Breasts: Deferred   Lab Results  Component Value Date   WBC 3.6 (L) 04/12/2019   HGB 14.3 04/12/2019   HCT 41.1 04/12/2019   MCV 86.2 04/12/2019   PLT 169 04/12/2019   Lab Results  Component Value Date   FERRITIN 363 (H) 09/04/2015   IRON 55 09/04/2015   TIBC 200 (L) 09/04/2015   UIBC 145 09/04/2015   IRONPCTSAT 28 09/04/2015   Lab Results  Component Value Date   RBC 4.77 04/12/2019   No results found for: KPAFRELGTCHN, LAMBDASER, KAPLAMBRATIO No results found for: Kandis Cocking, IGMSERUM No results found for: Odetta Pink, SPEI   Chemistry      Component Value Date/Time   NA 140 03/15/2019 0920   NA 145 06/06/2017 1316   NA 141 10/16/2015 1334   K 3.8 03/15/2019 0920   K 4.1 06/06/2017 1316   K 3.9 10/16/2015 1334   CL 107 03/15/2019 0920   CL 108 06/06/2017 1316   CO2 24 03/15/2019 0920   CO2 26 06/06/2017 1316   CO2 21 (L) 10/16/2015 1334   BUN 24 (H) 03/15/2019 0920   BUN 19 06/06/2017 1316   BUN 20.0 10/16/2015 1334   CREATININE 1.13 03/15/2019 0920   CREATININE 1.0 06/06/2017 1316   CREATININE 1.0 10/16/2015 1334      Component Value Date/Time   CALCIUM 10.1 03/15/2019 0920   CALCIUM 9.4 06/06/2017 1316   CALCIUM 9.7 10/16/2015 1334   ALKPHOS 61 03/15/2019 0920   ALKPHOS 78 06/06/2017 1316   ALKPHOS 62 10/16/2015 1334   AST 13 (L) 03/15/2019 0920   AST 16 10/16/2015 1334   ALT 9 03/15/2019 0920   ALT 27 06/06/2017 1316    ALT 11 10/16/2015 1334   BILITOT 0.5 03/15/2019 0920   BILITOT 0.49 10/16/2015 1334       Impression and Plan: James Byrd is a very pleasant 53 yo caucasian gentleman with history of stage IIIB melanoma of the right hallux that was subungual with one microscopic positive inguinal lymph node. He completed 3 cycles of Yervoy but stopped due to side effects.  He has since had nodal recurrence in the RIGHT inguinal nodes.  He is really hanging in there and wants to proceed with treatment today as planned.  He is back on his prescribed dose of Neurontin so hopefully this will  help.  I sent in a prescription for Oxycodone 5 mg PO Q6H PRN for breakthrough pain.  He will also add vitamin B 6 to his daily regimen.  PET scan is scheduled for 11/10 2020.  We will plan to see him back in another month.  He will contact our office with any questions or concerns. We can certainly see him sooner if needed.   Laverna Peace, NP 10/23/202011:23 AM

## 2019-04-15 LAB — TSH: TSH: 2.869 u[IU]/mL (ref 0.320–4.118)

## 2019-04-15 LAB — LACTATE DEHYDROGENASE: LDH: 136 U/L (ref 98–192)

## 2019-04-16 ENCOUNTER — Encounter: Payer: Self-pay | Admitting: Family

## 2019-04-19 ENCOUNTER — Other Ambulatory Visit: Payer: PPO

## 2019-04-19 ENCOUNTER — Ambulatory Visit: Payer: PPO

## 2019-04-22 ENCOUNTER — Telehealth: Payer: Self-pay | Admitting: *Deleted

## 2019-04-22 DIAGNOSIS — N390 Urinary tract infection, site not specified: Secondary | ICD-10-CM | POA: Diagnosis not present

## 2019-04-22 DIAGNOSIS — Z6830 Body mass index (BMI) 30.0-30.9, adult: Secondary | ICD-10-CM | POA: Diagnosis not present

## 2019-04-22 NOTE — Telephone Encounter (Signed)
Received call from [facility] that patient was in today for UTI symptoms.  UA and Culture sent off.  No changes in patients medication profile at this time.

## 2019-04-22 NOTE — Telephone Encounter (Signed)
Received call from Hoyle Sauer, patients wife stating that patient is having urgency and burning upon urination.  Patient is currently at PCP to address this.

## 2019-04-26 ENCOUNTER — Other Ambulatory Visit: Payer: Self-pay | Admitting: *Deleted

## 2019-04-26 ENCOUNTER — Telehealth: Payer: Self-pay | Admitting: *Deleted

## 2019-04-26 MED ORDER — OXYBUTYNIN CHLORIDE 5 MG PO TABS: 5.0000 mg | ORAL_TABLET | Freq: Two times a day (BID) | ORAL | 2 refills | Status: DC

## 2019-04-26 NOTE — Telephone Encounter (Signed)
Call placed back to patient and patient's wife notified per order of Dr. Marin Olp to take OTC Pepcid and that a prescription for Ditropan would be [facility] in [place].  Pt.'s wife appreciative of assistance and has no further questions or concerns at this time.

## 2019-04-26 NOTE — Telephone Encounter (Signed)
Call received from patient stating that he continues to have "burning and frequency with urination, his urine culture came back negative from his PCP two days a go, left knee has been swollen for 3-4 days, he is having abdominal pain which changes from gas pain to sharp pain, he is not taking Percocet d/t he is watching his grandchild, alka-seltzer has slightly relieved abd discomfort.  Dr. Marin Olp notified and order received for patient to take OTC Pepcid and Ditropan 5 mg BID.

## 2019-04-29 ENCOUNTER — Emergency Department (HOSPITAL_BASED_OUTPATIENT_CLINIC_OR_DEPARTMENT_OTHER): Payer: PPO

## 2019-04-29 ENCOUNTER — Encounter (HOSPITAL_BASED_OUTPATIENT_CLINIC_OR_DEPARTMENT_OTHER): Payer: Self-pay | Admitting: Emergency Medicine

## 2019-04-29 ENCOUNTER — Emergency Department (HOSPITAL_BASED_OUTPATIENT_CLINIC_OR_DEPARTMENT_OTHER)
Admission: EM | Admit: 2019-04-29 | Discharge: 2019-04-29 | Disposition: A | Discharge status: Home / Self Care | Payer: PPO | Attending: Emergency Medicine | Admitting: Emergency Medicine

## 2019-04-29 ENCOUNTER — Emergency Department: Payer: PPO

## 2019-04-29 ENCOUNTER — Other Ambulatory Visit: Payer: Self-pay

## 2019-04-29 DIAGNOSIS — E785 Hyperlipidemia, unspecified: Secondary | ICD-10-CM | POA: Insufficient documentation

## 2019-04-29 DIAGNOSIS — R600 Localized edema: Secondary | ICD-10-CM | POA: Diagnosis not present

## 2019-04-29 DIAGNOSIS — I5032 Chronic diastolic (congestive) heart failure: Secondary | ICD-10-CM | POA: Insufficient documentation

## 2019-04-29 DIAGNOSIS — M7989 Other specified soft tissue disorders: Secondary | ICD-10-CM | POA: Diagnosis not present

## 2019-04-29 DIAGNOSIS — M7122 Synovial cyst of popliteal space [Baker], left knee: Secondary | ICD-10-CM

## 2019-04-29 DIAGNOSIS — R2242 Localized swelling, mass and lump, left lower limb: Secondary | ICD-10-CM | POA: Diagnosis present

## 2019-04-29 DIAGNOSIS — Z79899 Other long term (current) drug therapy: Secondary | ICD-10-CM | POA: Insufficient documentation

## 2019-04-29 DIAGNOSIS — R52 Pain, unspecified: Secondary | ICD-10-CM

## 2019-04-29 LAB — CBC
HCT: 38.9 % — ABNORMAL LOW (ref 39.0–52.0)
Hemoglobin: 13.2 g/dL (ref 13.0–17.0)
MCH: 29.9 pg (ref 26.0–34.0)
MCHC: 33.9 g/dL (ref 30.0–36.0)
MCV: 88 fL (ref 80.0–100.0)
Platelets: 168 10*3/uL (ref 150–400)
RBC: 4.42 MIL/uL (ref 4.22–5.81)
RDW: 12.4 % (ref 11.5–15.5)
WBC: 5 10*3/uL (ref 4.0–10.5)
nRBC: 0 % (ref 0.0–0.2)

## 2019-04-29 LAB — BASIC METABOLIC PANEL
Anion gap: 7 (ref 5–15)
BUN: 16 mg/dL (ref 6–20)
CO2: 24 mmol/L (ref 22–32)
Calcium: 8.6 mg/dL — ABNORMAL LOW (ref 8.9–10.3)
Chloride: 108 mmol/L (ref 98–111)
Creatinine, Ser: 0.86 mg/dL (ref 0.61–1.24)
GFR calc Af Amer: >60 mL/min (ref >60)
GFR calc non Af Amer: >60 mL/min (ref >60)
Glucose, Bld: 83 mg/dL (ref 70–99)
Potassium: 3.4 mmol/L — ABNORMAL LOW (ref 3.5–5.1)
Sodium: 139 mmol/L (ref 135–145)

## 2019-04-29 MED ORDER — HYDROCODONE-ACETAMINOPHEN 5-325 MG PO TABS: 1.0000 | ORAL_TABLET | Freq: Four times a day (QID) | ORAL | 0 refills | Status: DC | PRN

## 2019-04-29 MED ORDER — HYDROCODONE-ACETAMINOPHEN 5-325 MG PO TABS
1.0000 | ORAL_TABLET | ORAL | Status: AC
  Administered 2019-04-29: 1 via ORAL
  Filled 2019-04-29: qty 1

## 2019-04-29 NOTE — ED Triage Notes (Signed)
Pt is c/o left leg swelling  Pt states he noticed swelling in his left knee about 4 days ago but today he has swelling from his knee down into his calf  Pt states it is painful

## 2019-04-29 NOTE — Discharge Instructions (Signed)
Follow up with an orthopedic doctor for further evaluation, take the medications as needed for pain

## 2019-04-29 NOTE — ED Provider Notes (Signed)
[facility]ENCY DEPARTMENT Provider Note   CSN: [id number] Arrival date & time: 04/29/19  1910     History   Chief Complaint Chief Complaint  Patient presents with  . Leg Swelling    HPI Jodie Leiner is a 53 y.o. male.     HPI Patient presents to the emergency room with complaints of leg swelling.  Patient has history of malignant melanoma.  It involved his right great toe.  Patient started having swelling in his left leg about 4 days ago.  He first noticed it in the back of his knee and now it has involved his entire lower leg.  Patient feels like his left leg is twice the size of his right.  He denies any chest pain or shortness of breath.  No fevers or chills.  No recent injuries. Past Medical History:  Diagnosis Date  . Adrenal insufficiency ([place])   . Anxiety 05/24/2012  . Complication of anesthesia    SLO TO AWAKEN MANY 8 TO 9 YRS AGO, NO ISSUES SINCE  . Goals of care, counseling/discussion 12/17/2018  . Gout   . History of kidney stones   . HLD (hyperlipidemia)   . Malignant melanoma of right great toe ([place]) 11/14/2014  . Paroxysmal supraventricular tachycardia ([place]) 05/24/2012   Described in the treadmill report; details are pending     Patient Active Problem List   Diagnosis Date Noted  . Severe sepsis with septic shock ([place]) 01/15/2019  . Acute respiratory failure with hypoxia ([place]) 01/15/2019  . AKI (acute kidney injury) ([place]) 01/15/2019  . Septic shock ([place]) 01/13/2019  . Goals of care, counseling/discussion 12/17/2018  . Adrenal insufficiency ([place])   . Refractory nausea and vomiting   . Paranasal sinus disease   . Acute renal failure syndrome ([place])   . HCAP (healthcare-associated pneumonia) 03/06/2015  . Drug-induced pneumonitis - VERVOY   . Thrombocytopenia ([place]) 02/09/2015  . HLD (hyperlipidemia)   . Chronic diastolic congestive heart failure ([place])   . Malignant melanoma of right great toe Stage IIIB ([id number]) 11/14/2014  . Obstructive  sleep apnea 07/19/2012  . Paroxysmal supraventricular tachycardia ([place]) 05/24/2012  . Chest pressure 05/24/2012  . Palpitations 05/24/2012  . Anxiety 05/24/2012    Past Surgical History:  Procedure Laterality Date  . ABLATION  8 YRS AGO  . CHOLECYSTECTOMY    . EXTRACORPOREAL SHOCK WAVE LITHOTRIPSY  5 YRS AGO  . LYMPH NODE BIOPSY Right 11/06/2018   Procedure: RIGHT INGUINAL LYMPH NODE BIOPSY AND POSSIBLE RIGHT GROIN NODE DISECTION;  Surgeon: Alphonsa Overall, MD;  Location: [facility] ORS;  Service: General;  Laterality: Right;  . r toe partial ambutation    . ROTATOR CUFF REPAIR Right 2010   3 yrs ago        Home Medications    Prior to Admission medications   Medication Sig Start Date End Date Taking? Authorizing Provider  Ascorbic Acid (VITAMIN C) 1000 MG tablet Take 1,000 mg by mouth daily.    [provider]  augmented betamethasone dipropionate (DIPROLENE-AF) 0.05 % cream Apply 1 application topically 2 (two) times daily as needed (psoriasis).  09/29/15   [provider]  Calcium Carb-Cholecalciferol (CALCIUM 600 + D PO) Take 1 tablet by mouth at bedtime.    [provider]  Cholecalciferol (VITAMIN D) 2000 units tablet Take 2,000 Units by mouth daily.    [provider]  diclofenac sodium (VOLTAREN) 1 % GEL Apply 2 g topically as needed (pain).  10/23/18   [provider]  fludrocortisone (FLORINEF) 0.1 MG tablet Take 2 tablets (0.2 mg total) by mouth daily. 03/29/19   [name], MD  gabapentin (NEURONTIN) 600 MG tablet Take 600 mg by mouth 2 (two) times daily. Taking twice daily    [provider]  GLUCOSAMINE-CHONDROITIN PO Take 1 tablet by mouth daily.    [provider]  HYDROcodone-acetaminophen (NORCO/VICODIN) 5-325 MG tablet Take 1 tablet by mouth every 6 (six) hours as needed. 04/29/19   Dorie Rank, MD  hydrocortisone (CORTEF) 5 MG tablet Take 1 tablet (5 mg total) by mouth 2 (two) times daily. 03/07/19   [name], MD  levofloxacin (LEVAQUIN) 500 MG tablet Take 1 tablet (500 mg total) by mouth daily. 03/15/19   [name], MD  loperamide (IMODIUM) 2 MG capsule Take 2 capsules (4 mg total) by mouth as needed for diarrhea or loose stools. 01/16/19   Nita Sells, MD  Multiple Vitamin (MULTIVITAMIN) tablet Take 1 tablet by mouth daily.    [provider]  oxybutynin (DITROPAN) 5 MG tablet Take 1 tablet (5 mg total) by mouth 2 (two) times daily. 04/26/19   [name], MD  oxyCODONE-acetaminophen (PERCOCET/ROXICET) 5-325 MG tablet Take 1 tablet by mouth every 6 (six) hours as needed for severe pain. 04/12/19   [place], Holli Humbles, NP  potassium & sodium phosphates (PHOS-NAK) 280-160-250 MG PACK Take 1 packet by mouth 4 (four) times daily -  with meals and at bedtime. Patient not taking: Reported on 03/04/2019 01/16/19   Nita Sells, MD  SUPER B COMPLEX/C PO Take 1 tablet by mouth daily.    [provider]  tadalafil (CIALIS) 10 MG tablet Take 1 tablet 30 min prior to sexual activity 10/17/18   [name], MD  testosterone (ANDROGEL) 50 MG/5GM (1%) GEL Place 5 g onto the skin daily. Send 90 day supply. Dose 1%. 03/22/19   [name], MD    Family History Family History  Adopted: Yes  Problem Relation Age of Onset  . Stroke Mother   . COPD Father   . Diabetes Father   . Diabetes Sister   . Diabetes Brother     Social History Social History   Tobacco Use  . Smoking status: Never Smoker  . Smokeless tobacco: Never Used  Substance Use Topics  . Alcohol use: No    Alcohol/week: 0.0 standard drinks  . Drug use: No     Allergies   Patient has no known allergies.   Review of Systems Review of Systems  All other systems reviewed and are negative.    Physical Exam Updated Vital Signs BP 113/76 (BP Location: Right Arm)   Pulse 81   Temp 98.4 F (36.9 C) (Oral)   Resp 16   Ht 1.829 m (6')   Wt 99.8 kg   SpO2 97%   BMI 29.84  kg/m   Physical Exam Vitals signs and nursing note reviewed.  Constitutional:      General: He is not in acute distress.    Appearance: He is well-developed.  HENT:     Head: Normocephalic and atraumatic.     Right Ear: External ear normal.     Left Ear: External ear normal.  Eyes:     General: No scleral icterus.       Right eye: No discharge.        Left eye: No discharge.     Conjunctiva/sclera: Conjunctivae normal.  Neck:  Musculoskeletal: Neck supple.     Trachea: No tracheal deviation.  Cardiovascular:     Rate and Rhythm: Normal rate and regular rhythm.  Pulmonary:     Effort: Pulmonary effort is normal. No respiratory distress.     Breath sounds: Normal breath sounds. No stridor. No wheezing or rales.  Abdominal:     General: Bowel sounds are normal. There is no distension.     Palpations: Abdomen is soft.     Tenderness: There is no abdominal tenderness. There is no guarding or rebound.  Musculoskeletal:        General: Tenderness present.     Left lower leg: Edema present.     Comments: No erythema, edema and tenderness behind the left knee down into the left calf, he has obvious  asymmetry compared to the right lower extremity  Skin:    General: Skin is warm and dry.     Findings: No rash.  Neurological:     Mental Status: He is alert.     Cranial Nerves: No cranial nerve deficit (no facial droop, extraocular movements intact, no slurred speech).     Sensory: No sensory deficit.     Motor: No abnormal muscle tone or seizure activity.     Coordination: Coordination normal.      ED Treatments / Results  Labs (all labs ordered are listed, but only abnormal results are displayed) Labs Reviewed  CBC - Abnormal; Notable for the following components:      Result Value   HCT 38.9 (*)    All other components within normal limits  BASIC METABOLIC PANEL - Abnormal; Notable for the following components:   Potassium 3.4 (*)    Calcium 8.6 (*)    All other  components within normal limits    EKG None  Radiology Dg Tibia/fibula Left  Result Date: 04/29/2019 CLINICAL DATA:  Left lower extremity swelling EXAM: LEFT TIBIA AND FIBULA - 2 VIEW COMPARISON:  None. FINDINGS: There is a 6 x 3 mm ossific fragment posterior to the tibiotalar joint seen on lateral view which may reflect an age indeterminate avulsion fracture. No visible tibiotalar joint effusion. Elsewhere, no acute fracture. No dislocation. There is swelling of the soft tissues of the calf, most pronounced posteriorly. No soft tissue gas. IMPRESSION: 1. Small ossific fragment posterior to the tibiotalar joint suggesting an age indeterminate avulsion fracture. Lack of visible tibiotalar joint effusion favors a chronic finding. Correlation with point tenderness recommended. 2. Nonspecific soft tissue swelling of the calf, most pronounced posteriorly. Electronically Signed   By: Davina Poke M.D.   On: 04/29/2019 21:01   US Venous Img Lower Unilateral Left (dvt)  Result Date: 04/29/2019 CLINICAL DATA:  Lower leg swelling for several days EXAM: LEFT LOWER EXTREMITY VENOUS DOPPLER ULTRASOUND TECHNIQUE: Gray-scale sonography with graded compression, as well as color Doppler and duplex ultrasound were performed to evaluate the lower extremity deep venous systems from the level of the common femoral vein and including the common femoral, femoral, profunda femoral, popliteal and calf veins including the posterior tibial, peroneal and gastrocnemius veins when visible. The superficial great saphenous vein was also interrogated. Spectral Doppler was utilized to evaluate flow at rest and with distal augmentation maneuvers in the common femoral, femoral and popliteal veins. COMPARISON:  None. FINDINGS: Contralateral Common Femoral Vein: Respiratory phasicity is normal and symmetric with the symptomatic side. No evidence of thrombus. Normal compressibility. Common Femoral Vein: No evidence of thrombus. Normal  compressibility, respiratory phasicity and response to  augmentation. Saphenofemoral Junction: No evidence of thrombus. Normal compressibility and flow on color Doppler imaging. Profunda Femoral Vein: No evidence of thrombus. Normal compressibility and flow on color Doppler imaging. Femoral Vein: No evidence of thrombus. Normal compressibility, respiratory phasicity and response to augmentation. Popliteal Vein: No evidence of thrombus. Normal compressibility, respiratory phasicity and response to augmentation. Calf Veins: No evidence of thrombus. Normal compressibility and flow on color Doppler imaging. Superficial Great Saphenous Vein: No evidence of thrombus. Normal compressibility. Venous Reflux:  None. Other Findings: Lower extremity edema is identified in the calf and ankles. Note is made of a cystic lesion posterior to the knee joint measuring 6.6 x 4.9 cm consistent with a popliteal cyst. IMPRESSION: No evidence of deep venous thrombosis. Left popliteal cyst is noted.  Calf edema is noted as well. Electronically Signed   By: Inez Catalina M.D.   On: 04/29/2019 22:18    Procedures Procedures (including critical care time)  Medications Ordered in ED Medications  HYDROcodone-acetaminophen (NORCO/VICODIN) 5-325 MG per tablet 1 tablet (has no administration in time range)     Initial Impression / Assessment and Plan / ED Course  I have reviewed the triage vital signs and the nursing notes.  Pertinent labs & imaging results that were available during my care of the patient were reviewed by me and considered in my medical decision making (see chart for details).   Labs reviewed.  No significant abnormalities.  Tibiotalar motion noted I do not think this is related to the patient's symptoms.  His focal area of tenderness is behind his knee in his calf.  Doppler study does not show evidence of DVT but rather a left popliteal cyst.  I suspect this is the source of the patient's discomfort.  Recommend  outpatient follow-up with orthopedics.  Final Clinical Impressions(s) / ED Diagnoses   Final diagnoses:  Synovial cyst of left popliteal space    ED Discharge Orders         Ordered    HYDROcodone-acetaminophen (NORCO/VICODIN) 5-325 MG tablet  Every 6 hours PRN     04/29/19 2245           Dorie Rank, MD 04/29/19 2247

## 2019-04-29 NOTE — ED Notes (Signed)
Pt ambulatory with steady gait to check out.

## 2019-04-30 ENCOUNTER — Telehealth: Payer: Self-pay | Admitting: Hematology & Oncology

## 2019-04-30 ENCOUNTER — Ambulatory Visit (HOSPITAL_COMMUNITY)
Admission: RE | Admit: 2019-04-30 | Discharge: 2019-04-30 | Disposition: A | Discharge status: Home / Self Care | Payer: PPO | Source: Ambulatory Visit | Attending: Hematology & Oncology | Admitting: Hematology & Oncology

## 2019-04-30 DIAGNOSIS — M25462 Effusion, left knee: Secondary | ICD-10-CM | POA: Insufficient documentation

## 2019-04-30 DIAGNOSIS — M7122 Synovial cyst of popliteal space [Baker], left knee: Secondary | ICD-10-CM | POA: Diagnosis not present

## 2019-04-30 DIAGNOSIS — C439 Malignant melanoma of skin, unspecified: Secondary | ICD-10-CM | POA: Diagnosis not present

## 2019-04-30 DIAGNOSIS — Z79899 Other long term (current) drug therapy: Secondary | ICD-10-CM | POA: Insufficient documentation

## 2019-04-30 DIAGNOSIS — C4371 Malignant melanoma of right lower limb, including hip: Secondary | ICD-10-CM | POA: Diagnosis not present

## 2019-04-30 LAB — GLUCOSE, CAPILLARY: Glucose-Capillary: 74 mg/dL (ref 70–99)

## 2019-04-30 MED ORDER — FLUDEOXYGLUCOSE F - 18 (FDG) INJECTION
11.0300 | Freq: Once | INTRAVENOUS | Status: AC | PRN
  Administered 2019-04-30: 11.03 via INTRAVENOUS

## 2019-04-30 NOTE — Telephone Encounter (Signed)
Patient's wife called with questions regarding his PET Scan location.  I advised her that it was at [facility].  She understood.  She also wanted to let Dr Marin Olp know that her husband went to the ED regarding his lower extremity swelling.  I discussed this w/ Thane Edu, RN and she said per ED notes patient needed to follow up w/ Orthopaedics.  She voiced understanding and would call Ortho for appointment.

## 2019-05-01 ENCOUNTER — Telehealth: Payer: Self-pay | Admitting: *Deleted

## 2019-05-01 DIAGNOSIS — M25462 Effusion, left knee: Secondary | ICD-10-CM | POA: Diagnosis not present

## 2019-05-01 NOTE — Telephone Encounter (Signed)
Message received from patient requesting PET scan results.  Dr. Marin Olp notified and states that he will call pt himself to go over results with patient.

## 2019-05-02 ENCOUNTER — Other Ambulatory Visit: Payer: Self-pay | Admitting: Hematology & Oncology

## 2019-05-02 DIAGNOSIS — C4371 Malignant melanoma of right lower limb, including hip: Secondary | ICD-10-CM

## 2019-05-03 ENCOUNTER — Telehealth: Payer: Self-pay | Admitting: *Deleted

## 2019-05-03 ENCOUNTER — Other Ambulatory Visit: Payer: Self-pay | Admitting: Hematology & Oncology

## 2019-05-03 ENCOUNTER — Ambulatory Visit (HOSPITAL_COMMUNITY)
Admission: RE | Admit: 2019-05-03 | Discharge: 2019-05-03 | Disposition: A | Discharge status: Home / Self Care | Payer: PPO | Source: Ambulatory Visit | Attending: Hematology & Oncology | Admitting: Hematology & Oncology

## 2019-05-03 ENCOUNTER — Other Ambulatory Visit: Payer: Self-pay

## 2019-05-03 ENCOUNTER — Telehealth: Payer: Self-pay | Admitting: Hematology & Oncology

## 2019-05-03 DIAGNOSIS — C4371 Malignant melanoma of right lower limb, including hip: Secondary | ICD-10-CM

## 2019-05-03 DIAGNOSIS — N281 Cyst of kidney, acquired: Secondary | ICD-10-CM | POA: Diagnosis not present

## 2019-05-03 DIAGNOSIS — C439 Malignant melanoma of skin, unspecified: Secondary | ICD-10-CM | POA: Diagnosis not present

## 2019-05-03 MED ORDER — GADOBUTROL 1 MMOL/ML IV SOLN
10.0000 mL | Freq: Once | INTRAVENOUS | Status: AC | PRN
  Administered 2019-05-03: 10 mL via INTRAVENOUS

## 2019-05-03 NOTE — Telephone Encounter (Signed)
Call received from patient's wife to obtain pt.'s MRI results.  Dr. Marin Olp notified and will contact pt regarding MRI results.

## 2019-05-03 NOTE — Telephone Encounter (Signed)
I spoke to the James Byrd's today.  James Byrd had at the MRI of his abdomen this morning.  The MRI did not show any obvious adenopathy.  However, there was abnormality which was unclear in the uncinated and tail of the pancreas.  I think that we are going to have to get a biopsy to see if this is melanoma.  I must say that it would be unusual for melanoma to metastasize to the pancreas but it is not unheard of.  I spoke with Dr. Paulita Fujita of gastroenterology.  He is very Set designer and will see Mr. James Byrd.  Hopefully, we will be able to get an endoscopic ultrasound so that we can figure out what might be going on with the pancreas.  This would be highly unusual for metastasis to the pancreas but again, it would be not unheard of.  James Haw, MD

## 2019-05-06 DIAGNOSIS — R935 Abnormal findings on diagnostic imaging of other abdominal regions, including retroperitoneum: Secondary | ICD-10-CM | POA: Diagnosis not present

## 2019-05-07 ENCOUNTER — Other Ambulatory Visit: Payer: Self-pay | Admitting: Gastroenterology

## 2019-05-07 ENCOUNTER — Telehealth: Payer: Self-pay

## 2019-05-07 NOTE — Telephone Encounter (Signed)
Received call from pt's wife asking if pt needs to keep appts for lab/MD/chemo on 11/20 as pt is having a biopsy on 11/23.   Per Dr Marin Olp, all appts on 11/20 canceled. Carolyn aware. Aware to expect call for r/s once we receive final pathology from the biopsy. 06/07/2019 not changed at this time. dph

## 2019-05-09 ENCOUNTER — Other Ambulatory Visit (HOSPITAL_COMMUNITY)
Admission: RE | Admit: 2019-05-09 | Discharge: 2019-05-09 | Disposition: A | Discharge status: Home / Self Care | Payer: PPO | Source: Ambulatory Visit | Attending: Gastroenterology | Admitting: Gastroenterology

## 2019-05-09 DIAGNOSIS — Z01812 Encounter for preprocedural laboratory examination: Secondary | ICD-10-CM | POA: Insufficient documentation

## 2019-05-09 DIAGNOSIS — Z20828 Contact with and (suspected) exposure to other viral communicable diseases: Secondary | ICD-10-CM | POA: Diagnosis not present

## 2019-05-10 ENCOUNTER — Other Ambulatory Visit: Payer: Self-pay

## 2019-05-10 ENCOUNTER — Telehealth: Payer: Self-pay | Admitting: *Deleted

## 2019-05-10 ENCOUNTER — Other Ambulatory Visit: Payer: PPO

## 2019-05-10 ENCOUNTER — Encounter (HOSPITAL_COMMUNITY): Payer: Self-pay | Admitting: *Deleted

## 2019-05-10 ENCOUNTER — Ambulatory Visit: Payer: PPO | Admitting: Hematology & Oncology

## 2019-05-10 ENCOUNTER — Ambulatory Visit: Payer: PPO

## 2019-05-10 LAB — NOVEL CORONAVIRUS, NAA (HOSP ORDER, SEND-OUT TO REF LAB; TAT 18-24 HRS): SARS-CoV-2, NAA: NOT DETECTED

## 2019-05-10 NOTE — Progress Notes (Signed)
Attempted pre op call for Monday 11/23, no answer , left VM.

## 2019-05-10 NOTE — Telephone Encounter (Signed)
Call received from patient's wife and patient on speaker phone stating that patient now has swelling to his right knee and frequency with urination.  Pt states that he is not taking Ditropan as prescribed by Dr. Marin Olp, d/t it causes him to be unable to void.  Patient states that he is not able to see his Ortho MD until the first week of December and that he did not contact his PCP regarding his urinary issues.  Dr. Marin Olp notified and would like for patient to come in to the office now for labs and to see Dr Marin Olp.  Per pt, he is not able to come to office today d/t he is to be quarantined after COVID-19 test which was done yesterday for his procedure on Monday.  Pt instructed to call office back with his ortho MD name per Dr. Marin Olp, d/t pt does not know it at this time. Pt states that he will have his wife call office back with ortho MD name.

## 2019-05-13 ENCOUNTER — Ambulatory Visit (HOSPITAL_COMMUNITY): Payer: PPO | Admitting: Certified Registered"

## 2019-05-13 ENCOUNTER — Encounter (HOSPITAL_COMMUNITY)
Admission: RE | Disposition: A | Discharge status: Home / Self Care | Payer: Self-pay | Source: Home / Self Care | Attending: Gastroenterology

## 2019-05-13 ENCOUNTER — Other Ambulatory Visit: Payer: Self-pay

## 2019-05-13 ENCOUNTER — Ambulatory Visit (HOSPITAL_COMMUNITY)
Admission: RE | Admit: 2019-05-13 | Discharge: 2019-05-13 | Disposition: A | Discharge status: Home / Self Care | Payer: PPO | Attending: Gastroenterology | Admitting: Gastroenterology

## 2019-05-13 DIAGNOSIS — D378 Neoplasm of uncertain behavior of other specified digestive organs: Secondary | ICD-10-CM | POA: Insufficient documentation

## 2019-05-13 DIAGNOSIS — J9601 Acute respiratory failure with hypoxia: Secondary | ICD-10-CM | POA: Diagnosis not present

## 2019-05-13 DIAGNOSIS — R933 Abnormal findings on diagnostic imaging of other parts of digestive tract: Secondary | ICD-10-CM | POA: Diagnosis not present

## 2019-05-13 DIAGNOSIS — Z85820 Personal history of malignant melanoma of skin: Secondary | ICD-10-CM | POA: Diagnosis not present

## 2019-05-13 DIAGNOSIS — R590 Localized enlarged lymph nodes: Secondary | ICD-10-CM | POA: Diagnosis not present

## 2019-05-13 DIAGNOSIS — D696 Thrombocytopenia, unspecified: Secondary | ICD-10-CM | POA: Diagnosis not present

## 2019-05-13 DIAGNOSIS — K8689 Other specified diseases of pancreas: Secondary | ICD-10-CM | POA: Diagnosis not present

## 2019-05-13 DIAGNOSIS — E785 Hyperlipidemia, unspecified: Secondary | ICD-10-CM | POA: Diagnosis not present

## 2019-05-13 HISTORY — PX: UPPER ESOPHAGEAL ENDOSCOPIC ULTRASOUND (EUS): SHX6562

## 2019-05-13 HISTORY — DX: Sleep apnea, unspecified: G47.30

## 2019-05-13 HISTORY — PX: FINE NEEDLE ASPIRATION: SHX5430

## 2019-05-13 HISTORY — PX: ESOPHAGOGASTRODUODENOSCOPY (EGD) WITH PROPOFOL: SHX5813

## 2019-05-13 SURGERY — UPPER ESOPHAGEAL ENDOSCOPIC ULTRASOUND (EUS)
Anesthesia: Monitor Anesthesia Care

## 2019-05-13 MED ORDER — SODIUM CHLORIDE 0.9 % IV SOLN: INTRAVENOUS | Status: DC

## 2019-05-13 MED ORDER — PHENYLEPHRINE 40 MCG/ML (10ML) SYRINGE FOR IV PUSH (FOR BLOOD PRESSURE SUPPORT)
PREFILLED_SYRINGE | INTRAVENOUS | Status: DC | PRN
  Administered 2019-05-13: 160 ug via INTRAVENOUS
  Administered 2019-05-13: 120 ug via INTRAVENOUS
  Administered 2019-05-13: 80 ug via INTRAVENOUS

## 2019-05-13 MED ORDER — PROPOFOL 10 MG/ML IV BOLUS
INTRAVENOUS | Status: DC | PRN
Start: 2019-05-13 — End: 2019-05-13
  Administered 2019-05-13: 20 mg via INTRAVENOUS

## 2019-05-13 MED ORDER — EPHEDRINE SULFATE-NACL 50-0.9 MG/10ML-% IV SOSY
PREFILLED_SYRINGE | INTRAVENOUS | Status: DC | PRN
  Administered 2019-05-13 (×2): 5 mg via INTRAVENOUS

## 2019-05-13 MED ORDER — PROPOFOL 10 MG/ML IV BOLUS
INTRAVENOUS | Status: AC
  Filled 2019-05-13: qty 20

## 2019-05-13 MED ORDER — PROPOFOL 500 MG/50ML IV EMUL
INTRAVENOUS | Status: DC | PRN
  Administered 2019-05-13: 125 ug/kg/min via INTRAVENOUS

## 2019-05-13 MED ORDER — LACTATED RINGERS IV SOLN
INTRAVENOUS | Status: DC
  Administered 2019-05-13: 13:00:00 via INTRAVENOUS

## 2019-05-13 MED ORDER — PROPOFOL 500 MG/50ML IV EMUL
INTRAVENOUS | Status: AC
  Filled 2019-05-13: qty 50

## 2019-05-13 NOTE — Anesthesia Preprocedure Evaluation (Addendum)
Anesthesia Evaluation  Patient identified by MRN, date of birth, ID band Patient awake    Reviewed: Allergy & Precautions, NPO status , Patient's Chart, lab work & pertinent test results  History of Anesthesia Complications (+) PONV  Airway Mallampati: II  TM Distance: >3 FB Neck ROM: Full    Dental no notable dental hx.    Pulmonary sleep apnea ,    Pulmonary exam normal breath sounds clear to auscultation       Cardiovascular + dysrhythmias Supra Ventricular Tachycardia  Rhythm:Regular Rate:Normal  EKG: 11/02/2018 Rate 64 bpm Normal sinus rhythm Septal infarct, age undetermined No significant change since last tracing   CV: Echo 08/28/17 The left ventricular size is normal. There is normal left ventricular wall  thickness. Left ventricular systolic function is normal. LV ejection fraction  = 60-65%. Left ventricular filling pattern is indeterminate. The left  ventricular wall motion is normal.    Neuro/Psych Anxiety negative neurological ROS     GI/Hepatic negative GI ROS, Neg liver ROS,   Endo/Other  AI  Renal/GU ARFRenal disease  negative genitourinary   Musculoskeletal negative musculoskeletal ROS (+)   Abdominal   Peds negative pediatric ROS (+)  Hematology negative hematology ROS (+)   Anesthesia Other Findings   Reproductive/Obstetrics negative OB ROS                             Anesthesia Physical  Anesthesia Plan  ASA: III  Anesthesia Plan: MAC   Post-op Pain Management:    Induction: Intravenous and Rapid sequence  PONV Risk Score and Plan:   Airway Management Planned: Nasal Cannula, Simple Face Mask and Mask  Additional Equipment:   Intra-op Plan:   Post-operative Plan: Extubation in OR  Informed Consent: I have reviewed the patients History and Physical, chart, labs and discussed the procedure including the risks, benefits and alternatives for the  proposed anesthesia with the patient or authorized representative who has indicated his/her understanding and acceptance.     Dental advisory given  Plan Discussed with: CRNA, Surgeon and Anesthesiologist  Anesthesia Plan Comments: (See PAT note 11/02/18, Konrad Felix, PA-C)        Anesthesia Quick Evaluation

## 2019-05-13 NOTE — H&P (Signed)
Patient interval history reviewed.  Patient examined again.  There has been no change from documented H/P dated 05/06/2019 (scanned into chart from our office) except as documented below:  Assessment:  1.  Melanoma, right hallux, with inguinal adenopathy, on immunotherapy, recent CBC ok. 2.  Abnormal MRI/PET:  Irregular uptake tail of pancreas > uncinate pancreas.  Plan:  1.  Upper endoscopic ultrasound with possible fine needle aspiration biopsies. 2.  Risks (bleeding, infection, bowel perforation that could require surgery, sedation-related changes in cardiopulmonary systems), benefits (identification and possible treatment of source of symptoms, exclusion of certain causes of symptoms), and alternatives (watchful waiting, radiographic imaging studies, empiric medical treatment) of upper endoscopy with ultrasound and possible fine needle aspiration biopsies (EUS +/- FNA) were explained to patient/family in detail and patient wishes to proceed.

## 2019-05-13 NOTE — Addendum Note (Signed)
Addendum  created 05/13/19 1648 by Janeece Riggers, MD   Attestation recorded in [place], [place] accepted, [place] Attestations filed

## 2019-05-13 NOTE — Discharge Instructions (Signed)
Endoscopy °Care After °Please read the instructions outlined below and refer to this sheet in the next few weeks. These discharge instructions provide you with general information on caring for yourself after you leave the hospital. Your doctor may also give you specific instructions. While your treatment has been planned according to the most current medical practices available, unavoidable complications occasionally occur. If you have any problems or questions after discharge, please call Dr. Rekita Miotke ([facility]) at [phone]. ° °HOME CARE INSTRUCTIONS °Activity °· You may resume your regular activity but move at a slower pace for the next 24 hours.  °· Take frequent rest periods for the next 24 hours.  °· Walking will help expel (get rid of) the air and reduce the bloated feeling in your abdomen.  °· No driving for 24 hours (because of the anesthesia (medicine) used during the test).  °· You may shower.  °· Do not sign any important legal documents or operate any machinery for 24 hours (because of the anesthesia used during the test).  °Nutrition °· Drink plenty of fluids.  °· You may resume your normal diet.  °· Begin with a light meal and progress to your normal diet.  °· Avoid alcoholic beverages for 24 hours or as instructed by your caregiver.  °Medications °You may resume your normal medications unless your caregiver tells you otherwise. °What you can expect today °· You may experience abdominal discomfort such as a feeling of fullness or "gas" pains.  °· You may experience a sore throat for 2 to 3 days. This is normal. Gargling with salt water may help this.  °·  °SEEK IMMEDIATE MEDICAL CARE IF: °· You have excessive nausea (feeling sick to your stomach) and/or vomiting.  °· You have severe abdominal pain and distention (swelling).  °· You have trouble swallowing.  °· You have a temperature over 100° F (37.8° C).  °· You have rectal bleeding or vomiting of blood.  °Document Released:  01/19/2004 Document Revised: 02/16/2011 Document Reviewed: 08/01/2007 °ExitCare® Patient Information ©2012 [employer]. °

## 2019-05-13 NOTE — Transfer of Care (Signed)
Immediate Anesthesia Transfer of Care Note  Patient: James Byrd  Procedure(s) Performed: UPPER ESOPHAGEAL ENDOSCOPIC ULTRASOUND (EUS) (N/A ) FINE NEEDLE ASPIRATION (FNA) LINEAR (N/A )  Patient Location: PACU  Anesthesia Type:MAC  Level of Consciousness: awake and alert   Airway & Oxygen Therapy: Patient Spontanous Breathing and Patient connected to face mask oxygen  Post-op Assessment: Report given to RN, Post -op Vital signs reviewed and stable and Patient moving all extremities X 4  Post vital signs: Reviewed and stable  Last Vitals:  Vitals Value Taken Time  BP    Temp    Pulse 80 05/13/19 1410  Resp    SpO2 98 % 05/13/19 1410  Vitals shown include unvalidated device data.  Last Pain:  Vitals:   05/13/19 1223  TempSrc: Oral  PainSc: 9          Complications: No apparent anesthesia complications

## 2019-05-13 NOTE — Op Note (Signed)
[facility] Patient Name: James Byrd Procedure Date: 05/13/2019 MRN: [id number] Attending MD: Arta Silence , MD Date of Birth: [birth year] CSN: [id number] Age: 53 Admit Type: Outpatient Procedure:                Upper EUS Indications:              Suspected mass in pancreas on MRI Providers:                Arta Silence, MD, Grace Isaac, RN, Benay Pillow, RN Referring MD:             Dr. Marin Olp Medicines:                Monitored Anesthesia Care Complications:            No immediate complications. Estimated Blood Loss:     Estimated blood loss was minimal. Procedure:                Pre-Anesthesia Assessment:                           - Prior to the procedure, a History and Physical                            was performed, and patient medications and                            allergies were reviewed. The patient's tolerance of                            previous anesthesia was also reviewed. The risks                            and benefits of the procedure and the sedation                            options and risks were discussed with the patient.                            All questions were answered, and informed consent                            was obtained. Prior Anticoagulants: The patient has                            taken no previous anticoagulant or antiplatelet                            agents. ASA Grade Assessment: III - A patient with                            severe systemic disease. After reviewing the risks  and benefits, the patient was deemed in                            satisfactory condition to undergo the procedure.                           After obtaining informed consent, the endoscope was                            passed under direct vision. Throughout the                            procedure, the patient's blood pressure, pulse, and                            oxygen  saturations were monitored continuously. The                            [id number] ([id number]) Olympus Linear EUS was                            introduced through the mouth, and advanced to the                            second part of duodenum. The upper EUS was                            accomplished without difficulty. The patient                            tolerated the procedure well. Scope In: Scope Out: Findings:      ENDOSONOGRAPHIC FINDING: :      There was no sign of significant endosonographic abnormality in the       common bile duct. The maximum diameter of the duct was 5 mm.      No lymphadenopathy seen.      There was no sign of significant endosonographic abnormality in the       ampulla.      There was no sign of significant endosonographic abnormality in the left       lobe of the liver.      An irregular mass was identified in the pancreatic tail. The mass was       hypoechoic. The mass measured 35 mm by 40 mm in maximal cross-sectional       diameter. The endosonographic borders were poorly-defined. Fine needle       aspiration for cytology was performed. Color Doppler imaging was       utilized prior to needle puncture to confirm a lack of significant       vascular structures within the needle path. Five passes were made with       the 25 gauge needle using a transgastric approach. A stylet was used. A       cytologist was present and performed a preliminary cytologic       examination. The cellularity of the specimen was adequate. Final       cytology results are pending.  An irregular mass was identified in the uncinate process of the       pancreas. The mass was hypoechoic. The mass measured 13 mm by 15 mm in       maximal cross-sectional diameter. The endosonographic borders were       poorly-defined. Impression:               - There was no sign of significant pathology in the                            common bile duct.                           - There  was no sign of significant pathology in the                            ampulla.                           - There was no evidence of significant pathology in                            the left lobe of the liver.                           - A mass was identified in the pancreatic tail.                            This was staged T3 N0 Mx by endosonographic                            criteria. Fine needle aspiration performed.                           - A mass was identified in the uncinate process of                            the pancreas. Moderate Sedation:      Not Applicable - Patient had care per Anesthesia. Recommendation:           - Discharge patient to home (via wheelchair).                           - Resume previous diet today.                           - Continue present medications.                           - Await cytology results.                           - Return to GI clinic PRN.                           - Return to referring physician as previously  scheduled. Procedure Code(s):        --- Professional ---                           [phone], Esophagogastroduodenoscopy, flexible,                            transoral; with transendoscopic ultrasound-guided                            intramural or transmural fine needle                            aspiration/biopsy(s) (includes endoscopic                            ultrasound examination of the esophagus, stomach,                            and either the duodenum or a surgically altered                            stomach where the jejunum is examined distal to the                            anastomosis) Diagnosis Code(s):        --- Professional ---                           K86.89, Other specified diseases of pancreas                           R93.3, Abnormal findings on diagnostic imaging of                            other parts of digestive tract CPT copyright 2019 American Medical  Association. All rights reserved. The codes documented in this report are preliminary and upon coder review may  be revised to meet current compliance requirements. Arta Silence, MD 05/13/2019 2:19:41 PM This report has been signed electronically. Number of Addenda: 0

## 2019-05-13 NOTE — Anesthesia Postprocedure Evaluation (Signed)
Anesthesia Post Note  Patient: Nikan Ellingson  Procedure(s) Performed: UPPER ESOPHAGEAL ENDOSCOPIC ULTRASOUND (EUS) (N/A ) FINE NEEDLE ASPIRATION (FNA) LINEAR (N/A )     Patient location during evaluation: PACU Anesthesia Type: MAC Level of consciousness: awake and alert Pain management: pain level controlled Vital Signs Assessment: post-procedure vital signs reviewed and stable Respiratory status: spontaneous breathing, nonlabored ventilation, respiratory function stable and patient connected to nasal cannula oxygen Cardiovascular status: stable and blood pressure returned to baseline Postop Assessment: no apparent nausea or vomiting Anesthetic complications: no    Last Vitals:  Vitals:   05/13/19 1420 05/13/19 1430  BP: 112/66 119/77  Pulse: 81 76  Resp: 19 10  Temp:    SpO2: 99% 96%    Last Pain:  Vitals:   05/13/19 1430  TempSrc:   PainSc: 0-No pain                 Lora Chavers

## 2019-05-13 NOTE — Anesthesia Procedure Notes (Signed)
Procedure Name: MAC Date/Time: 05/13/2019 1:12 PM Performed by: Niel Hummer, CRNA Pre-anesthesia Checklist: Patient identified, Emergency Drugs available, Suction available and Patient being monitored Patient Re-evaluated:Patient Re-evaluated prior to induction Oxygen Delivery Method: Simple face mask

## 2019-05-14 DIAGNOSIS — M1711 Unilateral primary osteoarthritis, right knee: Secondary | ICD-10-CM | POA: Diagnosis not present

## 2019-05-14 DIAGNOSIS — M25461 Effusion, right knee: Secondary | ICD-10-CM | POA: Diagnosis not present

## 2019-05-15 ENCOUNTER — Encounter (HOSPITAL_COMMUNITY): Payer: Self-pay | Admitting: Gastroenterology

## 2019-05-15 ENCOUNTER — Telehealth: Payer: Self-pay | Admitting: *Deleted

## 2019-05-15 LAB — CYTOLOGY - NON PAP

## 2019-05-15 NOTE — Telephone Encounter (Signed)
Message received from patient's wife, James Byrd wanting to know if Dr. Marin Olp has spoken with Dr. Paulita Fujita yet regarding biopsy results.  Dr. Marin Olp notified and call placed back to patient's wife to notify her per order of Dr. Marin Olp that biopsy results will not be back until Monday 05/20/19 or Tuesday 05/21/19.  James Byrd is appreciative of call back and has no further questions at this time.

## 2019-05-20 DIAGNOSIS — E782 Mixed hyperlipidemia: Secondary | ICD-10-CM | POA: Diagnosis not present

## 2019-05-20 DIAGNOSIS — I9589 Other hypotension: Secondary | ICD-10-CM | POA: Diagnosis not present

## 2019-05-22 ENCOUNTER — Inpatient Hospital Stay: Payer: PPO | Attending: Hematology & Oncology

## 2019-05-22 ENCOUNTER — Telehealth: Payer: Self-pay | Admitting: Hematology & Oncology

## 2019-05-22 ENCOUNTER — Telehealth: Payer: Self-pay

## 2019-05-22 ENCOUNTER — Inpatient Hospital Stay: Payer: PPO

## 2019-05-22 ENCOUNTER — Other Ambulatory Visit: Payer: Self-pay

## 2019-05-22 ENCOUNTER — Inpatient Hospital Stay (HOSPITAL_BASED_OUTPATIENT_CLINIC_OR_DEPARTMENT_OTHER): Payer: PPO | Admitting: Hematology & Oncology

## 2019-05-22 ENCOUNTER — Encounter: Payer: Self-pay | Admitting: Hematology & Oncology

## 2019-05-22 VITALS — BP 125/79 | HR 92 | Temp 97.7°F | Resp 20 | Wt 232.8 lb

## 2019-05-22 DIAGNOSIS — C7A8 Other malignant neuroendocrine tumors: Secondary | ICD-10-CM

## 2019-05-22 DIAGNOSIS — M06061 Rheumatoid arthritis without rheumatoid factor, right knee: Secondary | ICD-10-CM

## 2019-05-22 DIAGNOSIS — M25562 Pain in left knee: Secondary | ICD-10-CM | POA: Diagnosis not present

## 2019-05-22 DIAGNOSIS — Z5112 Encounter for antineoplastic immunotherapy: Secondary | ICD-10-CM | POA: Insufficient documentation

## 2019-05-22 DIAGNOSIS — Z79899 Other long term (current) drug therapy: Secondary | ICD-10-CM | POA: Insufficient documentation

## 2019-05-22 DIAGNOSIS — C4371 Malignant melanoma of right lower limb, including hip: Secondary | ICD-10-CM

## 2019-05-22 DIAGNOSIS — G620 Drug-induced polyneuropathy: Secondary | ICD-10-CM

## 2019-05-22 DIAGNOSIS — M06062 Rheumatoid arthritis without rheumatoid factor, left knee: Secondary | ICD-10-CM

## 2019-05-22 DIAGNOSIS — E032 Hypothyroidism due to medicaments and other exogenous substances: Secondary | ICD-10-CM

## 2019-05-22 DIAGNOSIS — M25462 Effusion, left knee: Secondary | ICD-10-CM | POA: Diagnosis not present

## 2019-05-22 DIAGNOSIS — C774 Secondary and unspecified malignant neoplasm of inguinal and lower limb lymph nodes: Secondary | ICD-10-CM | POA: Diagnosis not present

## 2019-05-22 DIAGNOSIS — T451X5A Adverse effect of antineoplastic and immunosuppressive drugs, initial encounter: Secondary | ICD-10-CM

## 2019-05-22 HISTORY — DX: Other malignant neuroendocrine tumors: C7A.8

## 2019-05-22 LAB — CBC WITH DIFFERENTIAL (CANCER CENTER ONLY)
Abs Immature Granulocytes: 0.06 10*3/uL (ref 0.00–0.07)
Basophils Absolute: 0 10*3/uL (ref 0.0–0.1)
Basophils Relative: 0 %
Eosinophils Absolute: 0.2 10*3/uL (ref 0.0–0.5)
Eosinophils Relative: 2 %
HCT: 36.4 % — ABNORMAL LOW (ref 39.0–52.0)
Hemoglobin: 12.3 g/dL — ABNORMAL LOW (ref 13.0–17.0)
Immature Granulocytes: 1 %
Lymphocytes Relative: 8 %
Lymphs Abs: 0.7 10*3/uL (ref 0.7–4.0)
MCH: 30.1 pg (ref 26.0–34.0)
MCHC: 33.8 g/dL (ref 30.0–36.0)
MCV: 89 fL (ref 80.0–100.0)
Monocytes Absolute: 0.6 10*3/uL (ref 0.1–1.0)
Monocytes Relative: 7 %
Neutro Abs: 6.9 10*3/uL (ref 1.7–7.7)
Neutrophils Relative %: 82 %
Platelet Count: 197 10*3/uL (ref 150–400)
RBC: 4.09 MIL/uL — ABNORMAL LOW (ref 4.22–5.81)
RDW: 13.7 % (ref 11.5–15.5)
WBC Count: 8.4 10*3/uL (ref 4.0–10.5)
nRBC: 0 % (ref 0.0–0.2)

## 2019-05-22 LAB — CMP (CANCER CENTER ONLY)
ALT: 12 U/L (ref 0–44)
AST: 10 U/L — ABNORMAL LOW (ref 15–41)
Albumin: 4.2 g/dL (ref 3.5–5.0)
Alkaline Phosphatase: 65 U/L (ref 38–126)
Anion gap: 9 (ref 5–15)
BUN: 22 mg/dL — ABNORMAL HIGH (ref 6–20)
CO2: 27 mmol/L (ref 22–32)
Calcium: 8.9 mg/dL (ref 8.9–10.3)
Chloride: 106 mmol/L (ref 98–111)
Creatinine: 0.97 mg/dL (ref 0.61–1.24)
GFR, Est AFR Am: >60 mL/min (ref >60)
GFR, Estimated: >60 mL/min (ref >60)
Glucose, Bld: 103 mg/dL — ABNORMAL HIGH (ref 70–99)
Potassium: 3.7 mmol/L (ref 3.5–5.1)
Sodium: 142 mmol/L (ref 135–145)
Total Bilirubin: 0.6 mg/dL (ref 0.3–1.2)
Total Protein: 6.6 g/dL (ref 6.5–8.1)

## 2019-05-22 LAB — TSH: TSH: 1.163 u[IU]/mL (ref 0.320–4.118)

## 2019-05-22 LAB — LACTATE DEHYDROGENASE: LDH: 172 U/L (ref 98–192)

## 2019-05-22 MED ORDER — LEVOFLOXACIN 500 MG PO TABS: 500.0000 mg | ORAL_TABLET | Freq: Every day | ORAL | 6 refills | Status: DC

## 2019-05-22 MED ORDER — KETOROLAC TROMETHAMINE 10 MG PO TABS: 10.0000 mg | ORAL_TABLET | Freq: Three times a day (TID) | ORAL | 2 refills | Status: DC | PRN

## 2019-05-22 MED ORDER — SODIUM CHLORIDE 0.9 % IV SOLN
Freq: Once | INTRAVENOUS | Status: AC
  Administered 2019-05-22: 12:00:00 via INTRAVENOUS
  Filled 2019-05-22: qty 250

## 2019-05-22 MED ORDER — KETOROLAC TROMETHAMINE 15 MG/ML IJ SOLN
30.0000 mg | Freq: Once | INTRAMUSCULAR | Status: AC
  Administered 2019-05-22: 30 mg via INTRAVENOUS
  Filled 2019-05-22: qty 2

## 2019-05-22 MED ORDER — SODIUM CHLORIDE 0.9 % IV SOLN
480.0000 mg | Freq: Once | INTRAVENOUS | Status: AC
  Administered 2019-05-22: 480 mg via INTRAVENOUS
  Filled 2019-05-22: qty 48

## 2019-05-22 MED ORDER — KETOROLAC TROMETHAMINE 15 MG/ML IJ SOLN
INTRAMUSCULAR | Status: AC
  Filled 2019-05-22: qty 1

## 2019-05-22 MED ORDER — LANREOTIDE ACETATE 120 MG/0.5ML ~~LOC~~ SOLN
120.0000 mg | Freq: Once | SUBCUTANEOUS | Status: AC
  Administered 2019-05-22: 120 mg via SUBCUTANEOUS

## 2019-05-22 MED ORDER — LANREOTIDE ACETATE 120 MG/0.5ML ~~LOC~~ SOLN
SUBCUTANEOUS | Status: AC
  Filled 2019-05-22: qty 120

## 2019-05-22 NOTE — Progress Notes (Signed)
Hematology and Oncology Follow Up Visit  James Byrd [id number] 1966/05/05 53 y.o. 05/22/2019   Principle Diagnosis:  Stage IIIB (T3bN1aM0) subungual melanoma of the right hallux -- nodal recurrence in the RIGHT inguinal nodes -- BRAF wt Neuroendocrine carcinoma of the pancreas  Current Therapy:   Yervoy-status post 3 cycles - discontinued in September 2016 secondary to toxicity  XRT to the RIGHT inguinal basin Nivolumab 400 mg q month -- s/p cycle #4 -start in 12/2018 Somatuline 120 mg IM q month -- started on 05/22/2019   Interim History:  James Byrd is here today for follow-up.  Unfortunately, a lot has happened since we last saw him.  We did his last PET scan, there is some activity that was noted with the pancreas.  He had an MRI which confirmed that there were 2 areas in the pancreas.  One at the tail the pancreas and another in the uncinate process.  You underwent a endoscopic ultrasound.  This was done on 05/13/2019.  The pathology report ([id number]) shows a neuroendocrine tumor.  I spoke to pathology.  They do not think this is melanoma.  As far as the melanoma is concerned, he has been on nivolumab.  He subsequently has developed a polyarthropathy.  He has had knee effusions that have had to be drained.  It is almost as if he has developed rheumatoid arthritis.  This is clearly a very unusual side effect from immunotherapy.  We have found a couple articles that have shown the development of seropositive polyarthropathy with nivolumab.  He has had think 4 cycles to date.  We will try to get him through the entire year of treatment.  This is incredibly complicated.  He does not want to be on steroids.  I gave him a dose of Toradol in the office.  I sent in some Toradol (10 mg p.o. every 8 hours as needed) to his pharmacy.  I think if the flareup of arthritis occurs, we might want to try infliximab.  I would have to say that of all the patients who we have treated with  immunotherapy, James Byrd clearly has had the worst time by far.  He almost died from the [place].  He has had neurological problems since [place].  He really wants to try to finish the nivolumab course.  I have to give him credit for trying.  He is doing all that we have asked him to do.  I just want his quality of life to be better.  I know he is seeing orthopedic surgery for the arthropathy.  I am going to try him on Somatuline for the pancreatic neuroendocrine tumor.  Hopefully this will help.  I am really would hate to have to get him to surgery for resection.  However, this is certainly "on the table" if we do not see a response with our next scan.  His appetite is doing okay.  He has had no change in bowel or bladder habits.  He has had no rashes.  He has had no fever.  Overall, his performance status is ECOG 1.    Allergies: No Known Allergies  Past Medical History, Surgical history, Social history, and Family History were reviewed and updated.  Review of Systems: Review of Systems  Constitutional: Positive for malaise/fatigue.  HENT: Negative.   Eyes: Negative.   Respiratory: Positive for shortness of breath.   Cardiovascular: Negative.   Gastrointestinal: Positive for constipation and nausea.  Genitourinary: Negative.   Musculoskeletal: Positive for joint pain  and myalgias.  Skin: Negative.   Neurological: Positive for tingling and focal weakness.  Endo/Heme/Allergies: Negative.   Psychiatric/Behavioral: Positive for memory loss.     Physical Exam:  weight is 232 lb 12.8 oz (105.6 kg). His oral temperature is 97.7 F (36.5 C). His blood pressure is 125/79 and his pulse is 92. His respiration is 20 and oxygen saturation is 97%.   Wt Readings from Last 3 Encounters:  05/22/19 232 lb 12.8 oz (105.6 kg)  04/29/19 220 lb (99.8 kg)  04/12/19 226 lb (102.5 kg)    Physical Exam Vitals signs reviewed.  HENT:     Head: Normocephalic and atraumatic.  Eyes:     Pupils:  Pupils are equal, round, and reactive to light.  Neck:     Musculoskeletal: Normal range of motion.  Cardiovascular:     Rate and Rhythm: Normal rate and regular rhythm.     Heart sounds: Normal heart sounds.  Pulmonary:     Effort: Pulmonary effort is normal.     Breath sounds: Normal breath sounds.  Abdominal:     General: Bowel sounds are normal.     Palpations: Abdomen is soft.     Comments: Abdominal exam shows a soft abdomen.  Bowel sounds are present.  There is no palpable abdominal mass.  There is no palpable liver or spleen tip.  He has a healing right inguinal lymphadenectomy scar.  I cannot palpate any obvious adenopathy.  The left inguinal area is unremarkable.    Musculoskeletal: Normal range of motion.        General: No tenderness or deformity.     Comments: The big toe on the right foot is amputated.  There is no lymphedema in the right leg.  He has good pulses in his distal extremities.  Left leg is unremarkable.  Lymphadenopathy:     Cervical: No cervical adenopathy.  Skin:    General: Skin is warm and dry.     Findings: No erythema or rash.  Neurological:     Mental Status: He is alert and oriented to person, place, and time.  Psychiatric:        Behavior: Behavior normal.        Thought Content: Thought content normal.        Judgment: Judgment normal.      Lab Results  Component Value Date   WBC 8.4 05/22/2019   HGB 12.3 (L) 05/22/2019   HCT 36.4 (L) 05/22/2019   MCV 89.0 05/22/2019   PLT 197 05/22/2019   Lab Results  Component Value Date   FERRITIN 363 (H) 09/04/2015   IRON 55 09/04/2015   TIBC 200 (L) 09/04/2015   UIBC 145 09/04/2015   IRONPCTSAT 28 09/04/2015   Lab Results  Component Value Date   RBC 4.09 (L) 05/22/2019   No results found for: KPAFRELGTCHN, LAMBDASER, KAPLAMBRATIO No results found for: IGGSERUM, IGA, IGMSERUM No results found for: Odetta Pink, SPEI   Chemistry        Component Value Date/Time   NA 142 05/22/2019 1011   NA 145 06/06/2017 1316   NA 141 10/16/2015 1334   K 3.7 05/22/2019 1011   K 4.1 06/06/2017 1316   K 3.9 10/16/2015 1334   CL 106 05/22/2019 1011   CL 108 06/06/2017 1316   CO2 27 05/22/2019 1011   CO2 26 06/06/2017 1316   CO2 21 (L) 10/16/2015 1334   BUN 22 (H) 05/22/2019 1011  BUN 19 06/06/2017 1316   BUN 20.0 10/16/2015 1334   CREATININE 0.97 05/22/2019 1011   CREATININE 1.0 06/06/2017 1316   CREATININE 1.0 10/16/2015 1334      Component Value Date/Time   CALCIUM 8.9 05/22/2019 1011   CALCIUM 9.4 06/06/2017 1316   CALCIUM 9.7 10/16/2015 1334   ALKPHOS 65 05/22/2019 1011   ALKPHOS 78 06/06/2017 1316   ALKPHOS 62 10/16/2015 1334   AST 10 (L) 05/22/2019 1011   AST 16 10/16/2015 1334   ALT 12 05/22/2019 1011   ALT 27 06/06/2017 1316   ALT 11 10/16/2015 1334   BILITOT 0.6 05/22/2019 1011   BILITOT 0.49 10/16/2015 1334      Impression and Plan: James Byrd is a very pleasant 53 yo caucasian gentleman with history of stage IIIB melanoma of the right hallux that was subungual with one microscopic positive inguinal lymph node. He completed 3 cycles of Yervoy but stopped due to side effects.   Again, we will proceed with the nivolumab along with the Somatuline.  This is incredibly complicated.  We are going to have to be very cautious and see how his arthropathy does.  I am just incredibly disappointed by all the problems that he has had.  I really thought that with nivolumab as single agent adjuvant therapy that he would really do well.  I spent about an hour with him today.  His wife was on the cell phone.  I reviewed his labs.  I reviewed the pathology report.  We talked about using infliximab if he has problems with his joints.  I will try to get him back to see [place] in another 4 weeks.  Hopefully, we will not run into too many problems with his immunotherapy.  Marland Kitchen     [name], MD 12/2/20204:30 PM

## 2019-05-22 NOTE — Patient Instructions (Signed)
[facility] Discharge Instructions for Patients Receiving Chemotherapy  Today you received the following chemotherapy agents:  Nivolumab.  To help prevent nausea and vomiting after your treatment, we encourage you to take your nausea medication as directed.   If you develop nausea and vomiting that is not controlled by your nausea medication, call the clinic.   BELOW ARE SYMPTOMS THAT SHOULD BE REPORTED IMMEDIATELY:  *FEVER GREATER THAN 100.5 F  *CHILLS WITH OR WITHOUT FEVER  NAUSEA AND VOMITING THAT IS NOT CONTROLLED WITH YOUR NAUSEA MEDICATION  *UNUSUAL SHORTNESS OF BREATH  *UNUSUAL BRUISING OR BLEEDING  TENDERNESS IN MOUTH AND THROAT WITH OR WITHOUT PRESENCE OF ULCERS  *URINARY PROBLEMS  *BOWEL PROBLEMS  UNUSUAL RASH Items with * indicate a potential emergency and should be followed up as soon as possible.  Feel free to call the clinic you have any questions or concerns. The clinic phone number is [phone].  Please show the CHEMO ALERT CARD at check-in to the Emergency Department and triage nurse.   

## 2019-05-22 NOTE — Telephone Encounter (Signed)
Per Baxter Flattery, Development worker, community, pt does not require PA for Somatuline injection and can be treated today. dph

## 2019-05-22 NOTE — Telephone Encounter (Signed)
Spoke with patient to confirm appointment date/time change 12/30 at 1130 am  per 12/2 LOS

## 2019-05-23 ENCOUNTER — Other Ambulatory Visit: Payer: Self-pay | Admitting: *Deleted

## 2019-05-23 ENCOUNTER — Telehealth: Payer: Self-pay | Admitting: *Deleted

## 2019-05-23 DIAGNOSIS — C4371 Malignant melanoma of right lower limb, including hip: Secondary | ICD-10-CM

## 2019-05-23 LAB — RHEUMATOID FACTOR: Rheumatoid fact SerPl-aCnc: <10 IU/mL (ref 0.0–13.9)

## 2019-05-23 MED ORDER — PROMETHAZINE HCL 25 MG RE SUPP: 25.0000 mg | Freq: Four times a day (QID) | RECTAL | 1 refills | Status: DC | PRN

## 2019-05-23 MED ORDER — ONDANSETRON 4 MG PO TBDP: 4.0000 mg | ORAL_TABLET | Freq: Three times a day (TID) | ORAL | 0 refills | Status: DC | PRN

## 2019-05-23 NOTE — Telephone Encounter (Signed)
Message received from patient's wife stating that pt was up all night with diarrhea and vomited x1 this morning.  She states that pt has taken two imodium and would like a call back as what to do next.  Dr. Marin Olp notified and order received for pt to stay hydrated, take Imodium 2 mg after each loose stool and also for pt to take oral Zofran as prescribed for nausea. Pt.'s wife notified of MD orders, appreciative of call back and has no further questions or concerns at this time.

## 2019-05-24 LAB — ANTINUCLEAR ANTIBODIES, IFA: ANA Ab, IFA: NEGATIVE

## 2019-05-27 ENCOUNTER — Telehealth: Payer: Self-pay | Admitting: Hematology & Oncology

## 2019-05-27 ENCOUNTER — Other Ambulatory Visit: Payer: Self-pay | Admitting: *Deleted

## 2019-05-27 ENCOUNTER — Telehealth: Payer: Self-pay | Admitting: *Deleted

## 2019-05-27 MED ORDER — TOBRAMYCIN-DEXAMETHASONE 0.3-0.1 % OP SUSP: 2.0000 [drp] | OPHTHALMIC | 0 refills | Status: DC

## 2019-05-27 NOTE — Telephone Encounter (Signed)
Call received from patient's wife stating that pt is having soreness to his left arm below his elbow and is having difficulty moving his arm with no swelling, has "pus" coming from his right eye and that it is partially shut which started yesterday and that pt went to an appt with his ortho MD this morning and he is scheduled for a MRI of his left knee tomorrow.  Dr. Marin Olp notified and would like for pt to send a picture of his eye via [place].  Call placed to patient's wife and notified her of MD orders to send picture of eye via [place].  Pt.'s wife states she will send picture now.

## 2019-05-27 NOTE — Telephone Encounter (Signed)
Call placed back to patient's wife to inform her that Dr. Marin Olp is going to send in a prescription for antibiotic eye gtts for pt.'s right eye and for pt to take Toradol tablets for left arm pain.  Pt.'s wife appreciative of call and has no further questions or concerns at this time.

## 2019-05-28 DIAGNOSIS — M25462 Effusion, left knee: Secondary | ICD-10-CM | POA: Diagnosis not present

## 2019-05-28 DIAGNOSIS — X58XXXA Exposure to other specified factors, initial encounter: Secondary | ICD-10-CM | POA: Diagnosis not present

## 2019-05-28 DIAGNOSIS — S83232A Complex tear of medial meniscus, current injury, left knee, initial encounter: Secondary | ICD-10-CM | POA: Diagnosis not present

## 2019-05-28 DIAGNOSIS — M7122 Synovial cyst of popliteal space [Baker], left knee: Secondary | ICD-10-CM | POA: Diagnosis not present

## 2019-05-28 DIAGNOSIS — M1712 Unilateral primary osteoarthritis, left knee: Secondary | ICD-10-CM | POA: Diagnosis not present

## 2019-05-28 DIAGNOSIS — M25562 Pain in left knee: Secondary | ICD-10-CM | POA: Diagnosis not present

## 2019-05-31 ENCOUNTER — Telehealth: Payer: Self-pay | Admitting: *Deleted

## 2019-05-31 ENCOUNTER — Other Ambulatory Visit: Payer: Self-pay | Admitting: Family

## 2019-05-31 ENCOUNTER — Encounter: Payer: Self-pay | Admitting: Hematology & Oncology

## 2019-05-31 ENCOUNTER — Other Ambulatory Visit: Payer: Self-pay

## 2019-05-31 ENCOUNTER — Ambulatory Visit (HOSPITAL_BASED_OUTPATIENT_CLINIC_OR_DEPARTMENT_OTHER)
Admission: RE | Admit: 2019-05-31 | Discharge: 2019-05-31 | Disposition: A | Discharge status: Home / Self Care | Payer: PPO | Source: Ambulatory Visit | Attending: Family | Admitting: Family

## 2019-05-31 DIAGNOSIS — M79662 Pain in left lower leg: Secondary | ICD-10-CM | POA: Diagnosis not present

## 2019-05-31 DIAGNOSIS — M7989 Other specified soft tissue disorders: Secondary | ICD-10-CM | POA: Diagnosis not present

## 2019-05-31 DIAGNOSIS — C4371 Malignant melanoma of right lower limb, including hip: Secondary | ICD-10-CM | POA: Diagnosis not present

## 2019-05-31 DIAGNOSIS — M7122 Synovial cyst of popliteal space [Baker], left knee: Secondary | ICD-10-CM | POA: Diagnosis not present

## 2019-05-31 NOTE — Telephone Encounter (Signed)
Call received from patient's wife regarding MyChart message with pictures of pt.'s knee and ankle.  Dr. Marin Olp reviewed pictures and message. Order received for pt to have a doppler of left leg today per Dr. Marin Olp.  Call placed to patient's wife and notified her to be at [facility] at 5:15PM for doppler of left leg.  Hoyle Sauer states that pt will be here at 5:15PM for doppler to left leg.  Hoyle Sauer requests that doppler be done to both legs. Dr. Marin Olp notified and order received for doppler to be done to left leg only.

## 2019-06-01 ENCOUNTER — Encounter: Payer: Self-pay | Admitting: Hematology & Oncology

## 2019-06-02 ENCOUNTER — Telehealth: Payer: Self-pay | Admitting: Hematology and Oncology

## 2019-06-02 ENCOUNTER — Encounter: Payer: Self-pay | Admitting: Hematology & Oncology

## 2019-06-02 NOTE — Telephone Encounter (Signed)
Received call from call center regarding Mr. James Byrd. The center connected me to speak directly with James Byrd. She notes her husband has had worsening swelling of the lower extremities. He has been followed by Dr. Marin Olp and he has been evaluating this issue. Patient has a known [place] Cyst and a recent ultrasound of the left leg on 05/31/2019. She notes no erythema, tenderness, or frank pain. The leg is just "dull and heavy". She also reports an elevation in temperature to 100.2 F which fell with administration of tylenol. At this time, no findings would prompt me to recommend ED visit. Told patient to monitor for erythema/pain or temperature exceeding 100.4 F and to call again if this were to occur.   Recommend compression stockings and elevation. Also, can have the patient evaluated at symptom management clinic on 06/03/2019 for further evaluation. Dr. Marin Olp made aware of patients call.  Ledell Peoples, MD Department of Hematology/Oncology [place] at [facility] Phone: [phone] Pager: [phone] Email: [email or web address]

## 2019-06-03 ENCOUNTER — Other Ambulatory Visit: Payer: Self-pay

## 2019-06-03 ENCOUNTER — Encounter (HOSPITAL_COMMUNITY): Payer: Self-pay | Admitting: Emergency Medicine

## 2019-06-03 ENCOUNTER — Telehealth: Payer: Self-pay | Admitting: *Deleted

## 2019-06-03 ENCOUNTER — Emergency Department (HOSPITAL_COMMUNITY)
Admission: EM | Admit: 2019-06-03 | Discharge: 2019-06-03 | Disposition: A | Discharge status: Home / Self Care | Payer: PPO | Attending: Emergency Medicine | Admitting: Emergency Medicine

## 2019-06-03 DIAGNOSIS — M79605 Pain in left leg: Secondary | ICD-10-CM | POA: Insufficient documentation

## 2019-06-03 DIAGNOSIS — I5032 Chronic diastolic (congestive) heart failure: Secondary | ICD-10-CM | POA: Diagnosis not present

## 2019-06-03 DIAGNOSIS — M25562 Pain in left knee: Secondary | ICD-10-CM | POA: Diagnosis not present

## 2019-06-03 DIAGNOSIS — Z79899 Other long term (current) drug therapy: Secondary | ICD-10-CM | POA: Insufficient documentation

## 2019-06-03 DIAGNOSIS — M1712 Unilateral primary osteoarthritis, left knee: Secondary | ICD-10-CM | POA: Diagnosis not present

## 2019-06-03 DIAGNOSIS — R2242 Localized swelling, mass and lump, left lower limb: Secondary | ICD-10-CM | POA: Diagnosis not present

## 2019-06-03 NOTE — ED Provider Notes (Signed)
[facility] DEPT Provider Note   CSN: [id number] Arrival date & time: 06/03/19  1128     History Chief Complaint  Patient presents with  . Leg Pain    Delta Deshmukh is a 53 y.o. male.  HPI      Dmoni Fortson is a 52 y.o. male, with a history of adrenal insufficiency, melanoma, neuroendocrine carcinoma of pancreas, presenting to the ED accompanied by his wife with left leg pain and swelling for the past month.  Patient states he feels as though it is worsening.  Pain is aching, severe, radiating throughout the leg.  He has been taking his prescribed Toradol, which improves the pain, but pain returns. 2 weeks ago, he was seen by an orthopedist in [place].  He performed arthrocentesis of both knees and injected steroid, improving the patient's pain.  Pain and swelling has returned.  He tells me the orthopedist communicated to him that he does not think the pain and swelling is due to the patient's Baker's cyst, but rather a side effect from patient's cancer treatments. Patient has experienced fluctuations in his temperature, but no true fever at any point.  He denies numbness, weakness, falls/trauma, N/V/D, chest pain, shortness of breath, syncope, or any other complaints.    Past Medical History:  Diagnosis Date  . Adrenal insufficiency ([place])   . Anxiety 05/24/2012  . Complication of anesthesia    SLO TO AWAKEN MANY 8 TO 9 YRS AGO, NO ISSUES SINCE  . Goals of care, counseling/discussion 12/17/2018  . Gout   . History of kidney stones   . HLD (hyperlipidemia)   . Malignant melanoma of right great toe ([place]) 11/14/2014  . Neuroendocrine carcinoma of pancreas ([place]) 05/22/2019  . Paroxysmal supraventricular tachycardia ([place]) 05/24/2012   Described in the treadmill report; details are pending   . Sleep apnea    MILD NO CPAP NEEDED    Patient Active Problem List   Diagnosis Date Noted  . Neuroendocrine carcinoma of pancreas ([place]) 05/22/2019  . Severe  sepsis with septic shock ([place]) 01/15/2019  . Acute respiratory failure with hypoxia ([place]) 01/15/2019  . AKI (acute kidney injury) ([place]) 01/15/2019  . Septic shock ([place]) 01/13/2019  . Goals of care, counseling/discussion 12/17/2018  . Adrenal insufficiency ([place])   . Refractory nausea and vomiting   . Paranasal sinus disease   . Acute renal failure syndrome ([place])   . HCAP (healthcare-associated pneumonia) 03/06/2015  . Drug-induced pneumonitis - VERVOY   . Thrombocytopenia ([place]) 02/09/2015  . HLD (hyperlipidemia)   . Chronic diastolic congestive heart failure ([place])   . Malignant melanoma of right great toe Stage IIIB ([id number]) 11/14/2014  . Obstructive sleep apnea 07/19/2012  . Paroxysmal supraventricular tachycardia ([place]) 05/24/2012  . Chest pressure 05/24/2012  . Palpitations 05/24/2012  . Anxiety 05/24/2012    Past Surgical History:  Procedure Laterality Date  . CHOLECYSTECTOMY    . ESOPHAGOGASTRODUODENOSCOPY (EGD) WITH PROPOFOL N/A 05/13/2019   Procedure: ESOPHAGOGASTRODUODENOSCOPY (EGD) WITH PROPOFOL;  Surgeon: Arta Silence, MD;  Location: [facility];  Service: Endoscopy;  Laterality: N/A;  . EXTRACORPOREAL SHOCK WAVE LITHOTRIPSY  2016  . FINE NEEDLE ASPIRATION N/A 05/13/2019   Procedure: FINE NEEDLE ASPIRATION (FNA) LINEAR;  Surgeon: Arta Silence, MD;  Location: [facility];  Service: Endoscopy;  Laterality: N/A;  . LYMPH NODE BIOPSY Right 11/06/2018   Procedure: RIGHT INGUINAL LYMPH NODE BIOPSY AND POSSIBLE RIGHT GROIN NODE DISECTION;  Surgeon: Alphonsa Overall, MD;  Location: [facility];  Service: General;  Laterality:  Right;  . r toe partial ambutation    . ROTATOR CUFF REPAIR Right 2010   3 yrs ago  . UPPER ESOPHAGEAL ENDOSCOPIC ULTRASOUND (EUS) N/A 05/13/2019   Procedure: UPPER ESOPHAGEAL ENDOSCOPIC ULTRASOUND (EUS);  Surgeon: Arta Silence, MD;  Location: [facility];  Service: Endoscopy;  Laterality: N/A;       Family History  Adopted: Yes  Problem Relation  Age of Onset  . Stroke Mother   . COPD Father   . Diabetes Father   . Diabetes Sister   . Diabetes Brother     Social History   Tobacco Use  . Smoking status: Never Smoker  . Smokeless tobacco: Never Used  Substance Use Topics  . Alcohol use: No    Alcohol/week: 0.0 standard drinks  . Drug use: No    Home Medications Prior to Admission medications   Medication Sig Start Date End Date Taking? Authorizing Provider  Ascorbic Acid (VITAMIN C) 1000 MG tablet Take 1,000 mg by mouth daily.    [provider]  augmented betamethasone dipropionate (DIPROLENE-AF) 0.05 % cream Apply 1 application topically 2 (two) times daily as needed (psoriasis).  09/29/15   [provider]  Cholecalciferol (VITAMIN D) 2000 units tablet Take 2,000 Units by mouth daily.    [provider]  diclofenac sodium (VOLTAREN) 1 % GEL Apply 2 g topically as needed (pain).  10/23/18   [provider]  fludrocortisone (FLORINEF) 0.1 MG tablet Take 2 tablets (0.2 mg total) by mouth daily. Patient taking differently: Take 0.1 mg by mouth 2 (two) times daily.  03/29/19   [name], MD  gabapentin (NEURONTIN) 600 MG tablet Take 600 mg by mouth 2 (two) times daily.     [provider]  GLUCOSAMINE-CHONDROITIN PO Take 1 tablet by mouth 2 (two) times daily.     [provider]  HYDROcodone-acetaminophen (NORCO/VICODIN) 5-325 MG tablet Take 1 tablet by mouth every 6 (six) hours as needed. Patient not taking: Reported on 05/22/2019 04/29/19   Dorie Rank, MD  hydrocortisone (CORTEF) 5 MG tablet Take 1 tablet (5 mg total) by mouth 2 (two) times daily. 03/07/19   [name], MD  ketorolac (TORADOL) 10 MG tablet Take 1 tablet (10 mg total) by mouth every 8 (eight) hours as needed. 05/22/19   [name], MD  levofloxacin (LEVAQUIN) 500 MG tablet Take 1 tablet (500 mg total) by mouth daily. 05/22/19   [name], MD  Multiple Vitamin (MULTIVITAMIN) tablet Take 1  tablet by mouth daily.    [provider]  ondansetron (ZOFRAN ODT) 4 MG disintegrating tablet Take 1 tablet (4 mg total) by mouth every 8 (eight) hours as needed for nausea or vomiting. 05/23/19   [name], MD  promethazine (PHENERGAN) 25 MG suppository Place 1 suppository (25 mg total) rectally every 6 (six) hours as needed for nausea or vomiting. 05/23/19   [name], MD  pyridOXINE (VITAMIN B-6) 100 MG tablet Take 100 mg by mouth daily.    [provider]  SUPER B COMPLEX/C PO Take 1 tablet by mouth daily.    [provider]  Testosterone 20.25 MG/ACT (1.62%) GEL Apply 1 Pump topically See admin instructions. Apply 1 pump on each shoulder once daily 03/25/19   [provider]  tobramycin-dexamethasone [facility]) ophthalmic solution Place 2 drops into the right eye every 4 (four) hours while awake. 05/27/19   [name], MD    Allergies    Patient  has no known allergies.  Review of Systems   Review of Systems  Constitutional: Negative for chills and fever.  Respiratory: Negative for cough and shortness of breath.   Cardiovascular: Negative for chest pain.  Gastrointestinal: Negative for abdominal pain, diarrhea, nausea and vomiting.  Musculoskeletal: Negative for back pain.       Left leg swelling and pain  Skin: Negative for color change.  Neurological: Negative for dizziness, syncope, weakness, light-headedness and numbness.    Physical Exam Updated Vital Signs BP (!) 143/84   Pulse 80   Temp 98.5 F (36.9 C) (Oral)   Resp 18   Ht 6' (1.829 m)   Wt 101.2 kg   SpO2 95%   BMI 30.24 kg/m   Physical Exam Vitals and nursing note reviewed.  Constitutional:      General: He is not in acute distress.    Appearance: He is well-developed. He is not diaphoretic.  HENT:     Head: Normocephalic and atraumatic.     Mouth/Throat:     Mouth: Mucous membranes are moist.     Pharynx: Oropharynx is clear.  Eyes:      Conjunctiva/sclera: Conjunctivae normal.  Cardiovascular:     Rate and Rhythm: Normal rate and regular rhythm.     Pulses: Normal pulses.          Radial pulses are 2+ on the right side and 2+ on the left side.       Dorsalis pedis pulses are 2+ on the right side and 2+ on the left side.       Posterior tibial pulses are 2+ on the right side and 2+ on the left side.     Comments: Tactile temperature in the extremities appropriate and equal bilaterally. Pulmonary:     Effort: Pulmonary effort is normal. No respiratory distress.  Abdominal:     Tenderness: There is no guarding.  Musculoskeletal:     Cervical back: Neck supple.     Right lower leg: No edema.     Left lower leg: Swelling and tenderness present.     Comments: Tenderness seems to be greatest to the left knee, especially posterior left knee.  He does have swelling and tenderness that extends into the left lower leg and some into the thigh. He does have slow, painful range of motion in the left knee and ankle, though these are limited by pain and swelling.  Lymphadenopathy:     Cervical: No cervical adenopathy.  Skin:    General: Skin is warm and dry.  Neurological:     Mental Status: He is alert.     Comments: Sensation to light touch grossly intact in the left lower extremity. Strength 5/5 in the lower extremities.  Psychiatric:        Mood and Affect: Mood and affect normal.        Speech: Speech normal.        Behavior: Behavior normal.     ED Results / Procedures / Treatments   Labs (all labs ordered are listed, but only abnormal results are displayed) Labs Reviewed - No data to display  EKG None  Radiology No results found.  Procedures Procedures (including critical care time)  Medications Ordered in ED Medications - No data to display  ED Course  I have reviewed the triage vital signs and the nursing notes.  Pertinent labs & imaging results that were available during my care of the patient were  reviewed by me and considered in  my medical decision making (see chart for details).  Clinical Course as of Jun 03 1551  Mon Jun 03, 2019  1249 Spoke with Dr. Marin Olp, patient's oncologist.  We reviewed the patient's recent cancer therapies and the possible side effects.  He states the patient's presentation and complaint are not consistent with any of the known side effects from his treatments.  In fact, if there are side effects from immunotherapy in the [facility] system, they tend to manifest as rheumatoid arthritis.  Patient's presentation and directed lab work are not consistent with this.   We discussed strategy and decided upon patient obtaining a second opinion from an orthopedic specialist.  He recommends Dr. Rhona Raider with [facility].   [SJ]  1572 Spoke with [facility].  She states patient can see Dr. Rhona Raider today at 4:30 PM.   [SJ]    Clinical Course User Index [SJ] Angelito Hopping, Helane Gunther, PA-C   MDM Rules/Calculators/A&P                      Patient presents with left lower leg swelling and pain. Patient is nontoxic appearing, afebrile, not tachycardic, not tachypneic, not hypotensive, excellent SPO2 on room air.  There is no evidence today of neurovascular compromise. In review of the patient's recent imaging, he had a duplex ultrasound performed on December 11 which showed quite a large Baker's cyst extending toward the ankle.  No evidence of DVT. Patient was quite upset during the visit.  He expresses frustration and exasperation due to his persistent pain.  He stated multiple times that he wanted his cyst, "drained or taken out." I did offer the patient analgesia both during the visit and in prescription form, but he declined. Patient will follow-up with orthopedics later today. Return precautions discussed.  Patient voices understanding of these instructions, accepts the plan, and is comfortable with discharge.  Findings and plan of care discussed with Adrian Prows, MD.   Final Clinical Impression(s) / ED Diagnoses Final diagnoses:  Left leg pain    Rx / DC Orders ED Discharge Orders    None       Layla Maw 06/03/19 1552    Carmin Muskrat, MD 06/05/19 2308

## 2019-06-03 NOTE — Discharge Instructions (Signed)
Go see the orthopedic specialist today at 4:30 PM. In the meantime, continue with your regular pain management regimen and elevate the extremity for as much time as possible.

## 2019-06-03 NOTE — Telephone Encounter (Signed)
Two calls received from pt.'s wife over the weekend and also two Mychart messages regarding patient.  Call received from patient this AM and he states that he is unable to walk, d/t pain 10/10 and swelling to left leg.  He also states that he has had a temp max of 100.3 over the weekend.  Pt states that he is taking Toradol for pain which is helping pain, but he does not want to take it d/t he does not like the way it makes him feel.  Pt was instructed by the on call MD over the weekend to go to the ED, but he did not go.  Dr. Marin Olp notified of patient's condition and order received for pt to go to the [facility] ED now.  Call placed back to patient and patient notified per order of Dr. Marin Olp to go to the [facility] ED now.  Pt states that he will go to the [facility] ED now.

## 2019-06-03 NOTE — ED Triage Notes (Signed)
Pt verbalizes pain to left leg from baker's cyst; pt requesting "drained or removed."

## 2019-06-04 ENCOUNTER — Other Ambulatory Visit (HOSPITAL_COMMUNITY): Payer: Self-pay | Admitting: Orthopedic Surgery

## 2019-06-04 ENCOUNTER — Ambulatory Visit (HOSPITAL_COMMUNITY)
Admission: RE | Admit: 2019-06-04 | Discharge: 2019-06-04 | Disposition: A | Discharge status: Home / Self Care | Payer: PPO | Source: Ambulatory Visit | Attending: Orthopedic Surgery | Admitting: Orthopedic Surgery

## 2019-06-04 ENCOUNTER — Other Ambulatory Visit: Payer: Self-pay

## 2019-06-04 DIAGNOSIS — M25562 Pain in left knee: Secondary | ICD-10-CM | POA: Diagnosis not present

## 2019-06-04 DIAGNOSIS — M79605 Pain in left leg: Secondary | ICD-10-CM | POA: Insufficient documentation

## 2019-06-04 DIAGNOSIS — M7989 Other specified soft tissue disorders: Secondary | ICD-10-CM | POA: Insufficient documentation

## 2019-06-04 NOTE — Progress Notes (Signed)
Left lower extremity venous duplex completed. Refer to "CV Proc" under chart review to view preliminary results.  Preliminary results discussed with Dr. Mayer Camel.  06/04/2019 11:10 AM Kelby Aline., MHA, RVT, RDCS, RDMS

## 2019-06-06 ENCOUNTER — Telehealth: Payer: Self-pay | Admitting: *Deleted

## 2019-06-06 ENCOUNTER — Other Ambulatory Visit: Payer: Self-pay | Admitting: *Deleted

## 2019-06-06 ENCOUNTER — Encounter: Payer: Self-pay | Admitting: Hematology & Oncology

## 2019-06-06 MED ORDER — ERYTHROMYCIN 5 MG/GM OP OINT: TOPICAL_OINTMENT | OPHTHALMIC | 0 refills | Status: DC

## 2019-06-06 NOTE — Telephone Encounter (Signed)
Call received from patient requesting Dr. Marin Olp to review doppler done on 06/04/19 ordered by Dr. Mayer Camel.  Doppler reviewed by Dr. Marin Olp, Dr. Marin Olp spoke with Dr. Mayer Camel and reviewed MRI done on 05/29/19.  Call placed back to patient and patient notified per order of Dr. Marin Olp that the MRI does not show a mass to his leg.  Pt.'s wife states that pt.'s right eye remains droopy and that pt has continued to run low grade temps, max 100.4.  MyChart message sent with picture.  Dr. Marin Olp notified and order received for pt to use Erythromycin eye ointment 1/2 inch ribbon QID.  Call placed back to patient and patient notified of MD order.  Pt.'s wife requests for [facility].  Prescription sent as requested.

## 2019-06-07 ENCOUNTER — Ambulatory Visit: Payer: PPO | Admitting: Hematology & Oncology

## 2019-06-07 ENCOUNTER — Other Ambulatory Visit: Payer: PPO

## 2019-06-07 ENCOUNTER — Ambulatory Visit: Payer: PPO

## 2019-06-08 ENCOUNTER — Other Ambulatory Visit: Payer: Self-pay | Admitting: Oncology

## 2019-06-11 DIAGNOSIS — M25562 Pain in left knee: Secondary | ICD-10-CM | POA: Diagnosis not present

## 2019-06-19 ENCOUNTER — Other Ambulatory Visit: Payer: Self-pay

## 2019-06-19 ENCOUNTER — Encounter: Payer: Self-pay | Admitting: Hematology & Oncology

## 2019-06-19 ENCOUNTER — Inpatient Hospital Stay: Payer: PPO

## 2019-06-19 ENCOUNTER — Telehealth: Payer: Self-pay | Admitting: *Deleted

## 2019-06-19 ENCOUNTER — Inpatient Hospital Stay (HOSPITAL_BASED_OUTPATIENT_CLINIC_OR_DEPARTMENT_OTHER): Payer: PPO | Admitting: Hematology & Oncology

## 2019-06-19 VITALS — BP 126/81 | HR 80 | Temp 97.5°F | Resp 19 | Wt 225.8 lb

## 2019-06-19 DIAGNOSIS — C4371 Malignant melanoma of right lower limb, including hip: Secondary | ICD-10-CM | POA: Diagnosis not present

## 2019-06-19 DIAGNOSIS — N179 Acute kidney failure, unspecified: Secondary | ICD-10-CM

## 2019-06-19 DIAGNOSIS — C7A8 Other malignant neuroendocrine tumors: Secondary | ICD-10-CM

## 2019-06-19 DIAGNOSIS — D696 Thrombocytopenia, unspecified: Secondary | ICD-10-CM

## 2019-06-19 DIAGNOSIS — Z87898 Personal history of other specified conditions: Secondary | ICD-10-CM | POA: Diagnosis not present

## 2019-06-19 DIAGNOSIS — M06061 Rheumatoid arthritis without rheumatoid factor, right knee: Secondary | ICD-10-CM

## 2019-06-19 DIAGNOSIS — M06062 Rheumatoid arthritis without rheumatoid factor, left knee: Secondary | ICD-10-CM

## 2019-06-19 DIAGNOSIS — Z5112 Encounter for antineoplastic immunotherapy: Secondary | ICD-10-CM | POA: Diagnosis not present

## 2019-06-19 LAB — CMP (CANCER CENTER ONLY)
ALT: 16 U/L (ref 0–44)
AST: 12 U/L — ABNORMAL LOW (ref 15–41)
Albumin: 3.5 g/dL (ref 3.5–5.0)
Alkaline Phosphatase: 57 U/L (ref 38–126)
Anion gap: 10 (ref 5–15)
BUN: 13 mg/dL (ref 6–20)
CO2: 27 mmol/L (ref 22–32)
Calcium: 8.5 mg/dL — ABNORMAL LOW (ref 8.9–10.3)
Chloride: 103 mmol/L (ref 98–111)
Creatinine: 0.88 mg/dL (ref 0.61–1.24)
GFR, Est AFR Am: >60 mL/min (ref >60)
GFR, Estimated: >60 mL/min (ref >60)
Glucose, Bld: 166 mg/dL — ABNORMAL HIGH (ref 70–99)
Potassium: 2.9 mmol/L — CL (ref 3.5–5.1)
Sodium: 140 mmol/L (ref 135–145)
Total Bilirubin: 0.5 mg/dL (ref 0.3–1.2)
Total Protein: 6.3 g/dL — ABNORMAL LOW (ref 6.5–8.1)

## 2019-06-19 LAB — CBC WITH DIFFERENTIAL (CANCER CENTER ONLY)
Abs Immature Granulocytes: 0.04 10*3/uL (ref 0.00–0.07)
Basophils Absolute: 0 10*3/uL (ref 0.0–0.1)
Basophils Relative: 0 %
Eosinophils Absolute: 0.1 10*3/uL (ref 0.0–0.5)
Eosinophils Relative: 1 %
HCT: 29.7 % — ABNORMAL LOW (ref 39.0–52.0)
Hemoglobin: 9.6 g/dL — ABNORMAL LOW (ref 13.0–17.0)
Immature Granulocytes: 1 %
Lymphocytes Relative: 8 %
Lymphs Abs: 0.7 10*3/uL (ref 0.7–4.0)
MCH: 28 pg (ref 26.0–34.0)
MCHC: 32.3 g/dL (ref 30.0–36.0)
MCV: 86.6 fL (ref 80.0–100.0)
Monocytes Absolute: 0.4 10*3/uL (ref 0.1–1.0)
Monocytes Relative: 5 %
Neutro Abs: 7.5 10*3/uL (ref 1.7–7.7)
Neutrophils Relative %: 85 %
Platelet Count: 311 10*3/uL (ref 150–400)
RBC: 3.43 MIL/uL — ABNORMAL LOW (ref 4.22–5.81)
RDW: 13.9 % (ref 11.5–15.5)
WBC Count: 8.7 10*3/uL (ref 4.0–10.5)
nRBC: 0 % (ref 0.0–0.2)

## 2019-06-19 LAB — URINALYSIS, COMPLETE (UACMP) WITH MICROSCOPIC
Bilirubin Urine: NEGATIVE
Glucose, UA: NEGATIVE mg/dL
Hgb urine dipstick: NEGATIVE
Ketones, ur: NEGATIVE mg/dL
Leukocytes,Ua: NEGATIVE
Nitrite: NEGATIVE
Protein, ur: NEGATIVE mg/dL
Specific Gravity, Urine: 1.02 (ref 1.005–1.030)
pH: 6 (ref 5.0–8.0)

## 2019-06-19 LAB — AMYLASE: Amylase: 256 U/L — ABNORMAL HIGH (ref 28–100)

## 2019-06-19 LAB — LACTATE DEHYDROGENASE: LDH: 181 U/L (ref 98–192)

## 2019-06-19 MED ORDER — KETOROLAC TROMETHAMINE 15 MG/ML IJ SOLN
INTRAMUSCULAR | Status: AC
  Filled 2019-06-19: qty 2

## 2019-06-19 MED ORDER — POTASSIUM CHLORIDE ER 20 MEQ PO TBCR: 40.0000 meq | EXTENDED_RELEASE_TABLET | Freq: Two times a day (BID) | ORAL | 4 refills | Status: DC

## 2019-06-19 MED ORDER — SODIUM CHLORIDE 0.9 % IV SOLN
Freq: Once | INTRAVENOUS | Status: AC
  Filled 2019-06-19: qty 250

## 2019-06-19 MED ORDER — KETOROLAC TROMETHAMINE 30 MG/ML IJ SOLN: 30.0000 mg | Freq: Once | INTRAMUSCULAR | Status: DC

## 2019-06-19 MED ORDER — KETOROLAC TROMETHAMINE 15 MG/ML IJ SOLN
30.0000 mg | Freq: Once | INTRAMUSCULAR | Status: AC
  Administered 2019-06-19: 30 mg via INTRAVENOUS

## 2019-06-19 MED ORDER — LEVOFLOXACIN 500 MG PO TABS: 500.0000 mg | ORAL_TABLET | Freq: Every day | ORAL | 6 refills | Status: DC

## 2019-06-19 MED ORDER — SODIUM CHLORIDE 0.9 % IV SOLN
480.0000 mg | Freq: Once | INTRAVENOUS | Status: AC
  Administered 2019-06-19: 480 mg via INTRAVENOUS
  Filled 2019-06-19: qty 48

## 2019-06-19 MED ORDER — POTASSIUM CHLORIDE CRYS ER 20 MEQ PO TBCR
40.0000 meq | EXTENDED_RELEASE_TABLET | Freq: Two times a day (BID) | ORAL | Status: DC
  Administered 2019-06-19 (×2): 40 meq via ORAL
  Filled 2019-06-19: qty 2

## 2019-06-19 NOTE — Progress Notes (Signed)
Hematology and Oncology Follow Up Visit  James Byrd [id number] Feb 28, 1966 53 y.o. 06/19/2019   Principle Diagnosis:  Stage IIIB ([id number]) subungual melanoma of the right hallux -- nodal recurrence in the RIGHT inguinal nodes -- BRAF wt Neuroendocrine carcinoma of the pancreas  Current Therapy:   Yervoy-status post 3 cycles - discontinued in September 2016 secondary to toxicity  XRT to the RIGHT inguinal basin Nivolumab 400 mg q month -- s/p cycle #5 -start in 12/2018 Somatuline 120 mg IM q month -- started on 05/22/2019   Interim History:  James Byrd is here today for follow-up.  His problem clearly has been orthopedic in nature.  He has had a lot of swelling in his left leg.  He was found to have a meniscus tear.  He also has a very large Bakers cyst.  He is seen a couple orthopedic surgeons.  He I think is wearing a compression sleeve on his left leg.  No surgery is being planned to remove the Baker's cyst.  Initially, the Doppler that he had may have shown some kind of a solid component to the Baker's cyst.  However, with the MRI, there is no such problem.  He is also complaining of urinary frequency.  We did do a urinalysis on him today.  He has some white cells and some bacteria.  We will get a urine culture to see if there is bacteria.  He is on Levaquin after the immunotherapy.  I would think that this would help.  He has had no diarrhea.  His potassium is only 2.9 today.  This might be secondary to the hydrocortisone that he is taking.  He says he needs to take this for his "immune system."  He will get supplemental potassium in the office today.  I will put him on potassium at home.  He has had no cough.  His wife says that he has some swelling with the right eye.  I have not seen anything of that so far.  I am sending off iron studies on him today.  He says he feels tired and worn down.  It is possible that his iron is low.  He is little bit more anemic today.  He  says he not bleeding.  We will have to try to give him some stool cards to see if he does have any blood in his stool.  He has had no issues with headache.  He has had some occasional temperatures.  I will not give him the Somatuline today.  I think we have to hold this for right now.  His amylase is on the higher side.  I know that this is incredibly complicated.  James Byrd clearly has a very unique sensitivity to medications, particularly immunotherapy.  Overall, his performance status is ECOG 1-2.  Allergies: No Known Allergies  Past Medical History, Surgical history, Social history, and Family History were reviewed and updated.  Review of Systems: Review of Systems  Constitutional: Positive for malaise/fatigue.  HENT: Negative.   Eyes: Negative.   Respiratory: Positive for shortness of breath.   Cardiovascular: Negative.   Gastrointestinal: Positive for constipation and nausea.  Genitourinary: Negative.   Musculoskeletal: Positive for joint pain and myalgias.  Skin: Negative.   Neurological: Positive for tingling and focal weakness.  Endo/Heme/Allergies: Negative.   Psychiatric/Behavioral: Positive for memory loss.     Physical Exam:  weight is 225 lb 12.8 oz (102.4 kg). His temporal temperature is 97.5 F (36.4 C) (abnormal). His blood  pressure is 126/81 and his pulse is 80. His respiration is 19 and oxygen saturation is 96%.   Wt Readings from Last 3 Encounters:  06/19/19 225 lb 12.8 oz (102.4 kg)  06/03/19 223 lb (101.2 kg)  05/22/19 232 lb 12.8 oz (105.6 kg)    Physical Exam Vitals reviewed.  HENT:     Head: Normocephalic and atraumatic.  Eyes:     Pupils: Pupils are equal, round, and reactive to light.  Cardiovascular:     Rate and Rhythm: Normal rate and regular rhythm.     Heart sounds: Normal heart sounds.  Pulmonary:     Effort: Pulmonary effort is normal.     Breath sounds: Normal breath sounds.  Abdominal:     General: Bowel sounds are normal.      Palpations: Abdomen is soft.     Comments: Abdominal exam shows a soft abdomen.  Bowel sounds are present.  There is no palpable abdominal mass.  There is no palpable liver or spleen tip.  He has a healing right inguinal lymphadenectomy scar.  I cannot palpate any obvious adenopathy.  The left inguinal area is unremarkable.    Musculoskeletal:        General: No tenderness or deformity. Normal range of motion.     Cervical back: Normal range of motion.     Comments: The big toe on the right foot is amputated.  There is no lymphedema in the right leg.  He has good pulses in his distal extremities.  Left leg is unremarkable.  Lymphadenopathy:     Cervical: No cervical adenopathy.  Skin:    General: Skin is warm and dry.     Findings: No erythema or rash.  Neurological:     Mental Status: He is alert and oriented to person, place, and time.  Psychiatric:        Behavior: Behavior normal.        Thought Content: Thought content normal.        Judgment: Judgment normal.      Lab Results  Component Value Date   WBC 8.7 06/19/2019   HGB 9.6 (L) 06/19/2019   HCT 29.7 (L) 06/19/2019   MCV 86.6 06/19/2019   PLT 311 06/19/2019   Lab Results  Component Value Date   FERRITIN 363 (H) 09/04/2015   IRON 55 09/04/2015   TIBC 200 (L) 09/04/2015   UIBC 145 09/04/2015   IRONPCTSAT 28 09/04/2015   Lab Results  Component Value Date   RBC 3.43 (L) 06/19/2019   No results found for: KPAFRELGTCHN, LAMBDASER, KAPLAMBRATIO No results found for: IGGSERUM, IGA, IGMSERUM No results found for: Odetta Pink, SPEI   Chemistry      Component Value Date/Time   NA 140 06/19/2019 1118   NA 145 06/06/2017 1316   NA 141 10/16/2015 1334   K 2.9 (LL) 06/19/2019 1118   K 4.1 06/06/2017 1316   K 3.9 10/16/2015 1334   CL 103 06/19/2019 1118   CL 108 06/06/2017 1316   CO2 27 06/19/2019 1118   CO2 26 06/06/2017 1316   CO2 21 (L) 10/16/2015 1334   BUN  13 06/19/2019 1118   BUN 19 06/06/2017 1316   BUN 20.0 10/16/2015 1334   CREATININE 0.88 06/19/2019 1118   CREATININE 1.0 06/06/2017 1316   CREATININE 1.0 10/16/2015 1334      Component Value Date/Time   CALCIUM 8.5 (L) 06/19/2019 1118   CALCIUM 9.4 06/06/2017 1316  CALCIUM 9.7 10/16/2015 1334   ALKPHOS 57 06/19/2019 1118   ALKPHOS 78 06/06/2017 1316   ALKPHOS 62 10/16/2015 1334   AST 12 (L) 06/19/2019 1118   AST 16 10/16/2015 1334   ALT 16 06/19/2019 1118   ALT 27 06/06/2017 1316   ALT 11 10/16/2015 1334   BILITOT 0.5 06/19/2019 1118   BILITOT 0.49 10/16/2015 1334      Impression and Plan: James Byrd is a very pleasant 53 yo caucasian gentleman with history of stage IIIB melanoma of the right hallux that was subungual with one microscopic positive inguinal lymph node. He completed 3 cycles of Yervoy but stopped due to side effects.   I realize that this is a very complicated situation that we are dealing with.  I know that James Byrd has just had difficulties with respect to immunotherapy.  Again I just do not think that immunotherapy is the sole reason that he has this problem with the left leg, particular with the meniscal tear and Baker's cyst.  I have never found any reference that would suggest that immunotherapy leads to destructive changes in the knee nor development of a Baker's cyst.  I am sure that orthopedic surgery will do their best to try to help out.  We will see what the urine culture shows.  Again we will proceed with the Opdivo.  We will have to have him come back in another month.  Again we will hold the Somatuline.  We will see how the amylase response to this.  We spent about 45 minutes with he and his wife today.  Thankfully his wife was able to come into the office which was really nice.  She is always nice to talk to and and clearly has been a very strong advocate for James Byrd.     [name], MD 12/30/20204:14 PM

## 2019-06-19 NOTE — Telephone Encounter (Signed)
Reported K 2.9.  Dr. Marin Olp notified.  No orders received.  Patient in room to see MD

## 2019-06-20 ENCOUNTER — Telehealth: Payer: Self-pay | Admitting: *Deleted

## 2019-06-20 ENCOUNTER — Other Ambulatory Visit: Payer: Self-pay | Admitting: Family

## 2019-06-20 DIAGNOSIS — I9589 Other hypotension: Secondary | ICD-10-CM | POA: Diagnosis not present

## 2019-06-20 DIAGNOSIS — D519 Vitamin B12 deficiency anemia, unspecified: Secondary | ICD-10-CM | POA: Diagnosis not present

## 2019-06-20 DIAGNOSIS — E782 Mixed hyperlipidemia: Secondary | ICD-10-CM | POA: Diagnosis not present

## 2019-06-20 LAB — URINE CULTURE: Culture: <10000 — AB

## 2019-06-20 LAB — IRON AND TIBC
Iron: 11 ug/dL — ABNORMAL LOW (ref 42–163)
Saturation Ratios: 7 % — ABNORMAL LOW (ref 20–55)
TIBC: 150 ug/dL — ABNORMAL LOW (ref 202–409)
UIBC: 139 ug/dL (ref 117–376)

## 2019-06-20 LAB — FERRITIN: Ferritin: 435 ng/mL — ABNORMAL HIGH (ref 24–336)

## 2019-06-20 NOTE — Telephone Encounter (Signed)
-----   Message from [name], MD sent at 06/20/2019 12:58 PM EST ----- Call - the iron is very low!!!  You need IV iron.  Please set up for EARLY next week!!  Laurey Arrow

## 2019-06-20 NOTE — Telephone Encounter (Signed)
As noted below by Dr. Marin Olp, I informed the patient of his iron levels and his urine culture result. Someone from scheduling will be calling to arrange an appointment for iron. He verbalized understanding.

## 2019-06-21 DIAGNOSIS — J069 Acute upper respiratory infection, unspecified: Secondary | ICD-10-CM | POA: Diagnosis not present

## 2019-06-24 ENCOUNTER — Inpatient Hospital Stay: Payer: PPO | Attending: Hematology & Oncology

## 2019-06-24 ENCOUNTER — Other Ambulatory Visit: Payer: Self-pay

## 2019-06-24 ENCOUNTER — Other Ambulatory Visit: Payer: Self-pay | Admitting: *Deleted

## 2019-06-24 VITALS — BP 133/93 | HR 67 | Temp 97.3°F | Resp 18

## 2019-06-24 DIAGNOSIS — C7A8 Other malignant neuroendocrine tumors: Secondary | ICD-10-CM

## 2019-06-24 DIAGNOSIS — C774 Secondary and unspecified malignant neoplasm of inguinal and lower limb lymph nodes: Secondary | ICD-10-CM | POA: Insufficient documentation

## 2019-06-24 DIAGNOSIS — Z79899 Other long term (current) drug therapy: Secondary | ICD-10-CM | POA: Diagnosis not present

## 2019-06-24 DIAGNOSIS — Z5112 Encounter for antineoplastic immunotherapy: Secondary | ICD-10-CM | POA: Insufficient documentation

## 2019-06-24 DIAGNOSIS — C4371 Malignant melanoma of right lower limb, including hip: Secondary | ICD-10-CM | POA: Insufficient documentation

## 2019-06-24 MED ORDER — SODIUM CHLORIDE 0.9 % IV SOLN
200.0000 mg | Freq: Once | INTRAVENOUS | Status: AC
  Administered 2019-06-24: 200 mg via INTRAVENOUS
  Filled 2019-06-24: qty 10

## 2019-06-24 MED ORDER — SODIUM CHLORIDE 0.9% FLUSH
10.0000 mL | Freq: Once | INTRAVENOUS | Status: DC | PRN
  Filled 2019-06-24: qty 10

## 2019-06-24 MED ORDER — SODIUM CHLORIDE 0.9 % IV SOLN
Freq: Once | INTRAVENOUS | Status: AC
  Filled 2019-06-24: qty 250

## 2019-06-24 MED ORDER — HEPARIN SOD (PORK) LOCK FLUSH 100 UNIT/ML IV SOLN
500.0000 [IU] | Freq: Once | INTRAVENOUS | Status: DC | PRN
  Filled 2019-06-24: qty 5

## 2019-06-24 MED ORDER — KETOROLAC TROMETHAMINE 10 MG PO TABS: 10.0000 mg | ORAL_TABLET | Freq: Three times a day (TID) | ORAL | 2 refills | Status: DC | PRN

## 2019-06-24 NOTE — Progress Notes (Signed)
Hold Somatuline today per Dr. Marin Olp.

## 2019-06-24 NOTE — Patient Instructions (Signed)

## 2019-06-25 ENCOUNTER — Other Ambulatory Visit: Payer: Self-pay | Admitting: Family

## 2019-07-01 ENCOUNTER — Telehealth: Payer: Self-pay | Admitting: Hematology & Oncology

## 2019-07-01 ENCOUNTER — Telehealth: Payer: Self-pay | Admitting: Family

## 2019-07-01 NOTE — Telephone Encounter (Signed)
Called and spoke with both patient and his wife regarding appointment added per 1/11 los

## 2019-07-01 NOTE — Telephone Encounter (Signed)
Called and spoke with patient regarding appointment added per 1/11 sch msg

## 2019-07-05 ENCOUNTER — Inpatient Hospital Stay: Payer: PPO

## 2019-07-05 ENCOUNTER — Other Ambulatory Visit: Payer: Self-pay | Admitting: Family

## 2019-07-05 ENCOUNTER — Other Ambulatory Visit: Payer: Self-pay

## 2019-07-05 VITALS — BP 156/97 | HR 74 | Temp 97.8°F | Resp 18

## 2019-07-05 DIAGNOSIS — C7A8 Other malignant neuroendocrine tumors: Secondary | ICD-10-CM

## 2019-07-05 DIAGNOSIS — Z5112 Encounter for antineoplastic immunotherapy: Secondary | ICD-10-CM | POA: Diagnosis not present

## 2019-07-05 MED ORDER — SODIUM CHLORIDE 0.9 % IV SOLN
Freq: Once | INTRAVENOUS | Status: AC
  Filled 2019-07-05: qty 250

## 2019-07-05 MED ORDER — SODIUM CHLORIDE 0.9 % IV SOLN
200.0000 mg | Freq: Once | INTRAVENOUS | Status: AC
  Administered 2019-07-05: 15:00:00 200 mg via INTRAVENOUS
  Filled 2019-07-05: qty 200

## 2019-07-05 NOTE — Patient Instructions (Signed)

## 2019-07-08 ENCOUNTER — Telehealth: Payer: Self-pay | Admitting: Family

## 2019-07-08 ENCOUNTER — Other Ambulatory Visit: Payer: Self-pay | Admitting: Family

## 2019-07-08 ENCOUNTER — Telehealth: Payer: Self-pay | Admitting: Hematology & Oncology

## 2019-07-08 ENCOUNTER — Other Ambulatory Visit: Payer: Self-pay | Admitting: *Deleted

## 2019-07-08 DIAGNOSIS — M25559 Pain in unspecified hip: Secondary | ICD-10-CM

## 2019-07-08 DIAGNOSIS — R32 Unspecified urinary incontinence: Secondary | ICD-10-CM

## 2019-07-08 DIAGNOSIS — R350 Frequency of micturition: Secondary | ICD-10-CM

## 2019-07-08 DIAGNOSIS — R3915 Urgency of urination: Secondary | ICD-10-CM

## 2019-07-08 DIAGNOSIS — C7A8 Other malignant neuroendocrine tumors: Secondary | ICD-10-CM

## 2019-07-08 MED ORDER — KETOROLAC TROMETHAMINE 10 MG PO TABS: 10.0000 mg | ORAL_TABLET | Freq: Four times a day (QID) | ORAL | 1 refills | Status: DC | PRN

## 2019-07-08 NOTE — Telephone Encounter (Signed)
I was able to speak with James Byrd and let him know we have placed a referral with Urology Dr. Dorina Hoyer.  We will also increase the frequency of his Toradol to every 6 hours for arthritic pain. He was agreeable to this change.  We will plan to see him on Thursday for his injection and IV iron. No other questions at this time.

## 2019-07-08 NOTE — Telephone Encounter (Signed)
Called and left detailed message regarding iron infusion appointments added per 1/18 sch msg

## 2019-07-11 ENCOUNTER — Inpatient Hospital Stay: Payer: PPO

## 2019-07-11 ENCOUNTER — Other Ambulatory Visit: Payer: Self-pay

## 2019-07-11 VITALS — BP 128/74 | HR 84 | Temp 97.7°F | Resp 20

## 2019-07-11 DIAGNOSIS — Z5112 Encounter for antineoplastic immunotherapy: Secondary | ICD-10-CM | POA: Diagnosis not present

## 2019-07-11 DIAGNOSIS — C7A8 Other malignant neuroendocrine tumors: Secondary | ICD-10-CM

## 2019-07-11 MED ORDER — LANREOTIDE ACETATE 120 MG/0.5ML ~~LOC~~ SOLN
120.0000 mg | Freq: Once | SUBCUTANEOUS | Status: AC
  Administered 2019-07-11: 120 mg via SUBCUTANEOUS

## 2019-07-11 MED ORDER — SODIUM CHLORIDE 0.9 % IV SOLN
Freq: Once | INTRAVENOUS | Status: AC
  Filled 2019-07-11: qty 250

## 2019-07-11 MED ORDER — SODIUM CHLORIDE 0.9 % IV SOLN
200.0000 mg | Freq: Once | INTRAVENOUS | Status: AC
  Administered 2019-07-11: 200 mg via INTRAVENOUS
  Filled 2019-07-11: qty 200

## 2019-07-11 NOTE — Patient Instructions (Signed)
Lanreotide injection What is this medicine? LANREOTIDE (lan REE oh tide) is used to reduce blood levels of growth hormone in patients with a condition called acromegaly. It also works to slow or stop tumor growth in patients with neuroendocrine tumors and treat carcinoid syndrome. This medicine may be used for other purposes; ask your health care provider or pharmacist if you have questions. COMMON BRAND NAME(S): Somatuline Depot What should I tell my health care provider before I take this medicine? They need to know if you have any of these conditions:  diabetes  gallbladder disease  heart disease  kidney disease  liver disease  thyroid disease  an unusual or allergic reaction to lanreotide, other medicines, foods, dyes, or preservatives  pregnant or trying to get pregnant  breast-feeding How should I use this medicine? This medicine is for injection under the skin. It is given by a health care professional in a hospital or clinic setting. Contact your pediatrician or health care professional regarding the use of this medicine in children. Special care may be needed. Overdosage: If you think you have taken too much of this medicine contact a poison control center or emergency room at once. NOTE: This medicine is only for you. Do not share this medicine with others. What if I miss a dose? It is important not to miss your dose. Call your doctor or health care professional if you are unable to keep an appointment. What may interact with this medicine? This medicine may interact with the following medications:  bromocriptine  cyclosporine  certain medicines for blood pressure, heart disease, irregular heart beat  certain medicines for diabetes  quinidine  terfenadine This list may not describe all possible interactions. Give your health care provider a list of all the medicines, herbs, non-prescription drugs, or dietary supplements you use. Also tell them if you smoke,  drink alcohol, or use illegal drugs. Some items may interact with your medicine. What should I watch for while using this medicine? Tell your doctor or healthcare professional if your symptoms do not start to get better or if they get worse. Visit your doctor or health care professional for regular checks on your progress. Your condition will be monitored carefully while you are receiving this medicine. This medicine may increase blood sugar. Ask your healthcare provider if changes in diet or medicines are needed if you have diabetes. You may need blood work done while you are taking this medicine. Women should inform their doctor if they wish to become pregnant or think they might be pregnant. There is a potential for serious side effects to an unborn child. Talk to your health care professional or pharmacist for more information. Do not breast-feed an infant while taking this medicine or for 6 months after stopping it. This medicine has caused ovarian failure in some women. This medicine may interfere with the ability to have a child. Talk with your doctor or health care professional if you are concerned about your fertility. What side effects may I notice from receiving this medicine? Side effects that you should report to your doctor or health care professional as soon as possible:  allergic reactions like skin rash, itching or hives, swelling of the face, lips, or tongue  increased blood pressure  severe stomach pain  signs and symptoms of hgh blood sugar such as being more thirsty or hungry or having to urinate more than normal. You may also feel very tired or have blurry vision.  signs and symptoms of low blood   sugar such as feeling anxious; confusion; dizziness; increased hunger; unusually weak or tired; sweating; shakiness; cold; irritable; headache; blurred vision; fast heartbeat; loss of consciousness  unusually slow heartbeat Side effects that usually do not require medical  attention (report to your doctor or health care professional if they continue or are bothersome):  constipation  diarrhea  dizziness  headache  muscle pain  muscle spasms  nausea  pain, redness, or irritation at site where injected This list may not describe all possible side effects. Call your doctor for medical advice about side effects. You may report side effects to FDA at 1-800-FDA-1088. Where should I keep my medicine? This drug is given in a hospital or clinic and will not be stored at home. NOTE: This sheet is a summary. It may not cover all possible information. If you have questions about this medicine, talk to your doctor, pharmacist, or health care provider.  2020 Elsevier/Gold Standard (2018-03-15 09:13:08) Somatic Symptom Disorder Somatic symptom disorder is a mental health condition. People with this disorder have physical (somatic) symptoms that cause distress or affect daily living. However, no other medical condition can be found to explain these symptoms. Somatic symptom disorder may interfere with relationships, work, school, or other daily activities. It may lead to frequent medical visits and many medical tests to try to find the cause of symptoms. It may also lead to surgical procedures that do not help and can cause serious problems. People with somatic symptom disorder are also at risk for alcohol or drug addiction, suicide attempts, and divorce. Somatic symptom disorder may start in childhood but is most common in young adults. The disorder may be triggered by stressful life events. It may last for several years or may come and go throughout life. What are the causes? The exact cause of this condition is often unknown. What increases the risk? The following factors may make you more likely to develop this condition:  Being male. The disorder is more common in females than males.  Having a history of childhood abuse.  Having a history of alcohol or  substance abuse.  Having family members with the disorder.  Having other mental health conditions, including personality disorders. What are the signs or symptoms? You may have somatic symptoms that cannot be explained by a medical condition. Examples of somatic symptoms may include:  Pain. This may be the only physical symptom. Pain may involve any part of the body.  Stomach or intestinal symptoms. These may include nausea, indigestion, diarrhea, or abdominal pain.  Other non-specific symptoms such as fatigue, trouble sleeping, headaches, or dizziness. How is this diagnosed? You may be diagnosed with this condition if:  You have one or more somatic symptoms that cause you distress or affect your daily living.  You react to the somatic symptoms in a way that is out of proportion to the symptoms. The reaction may include: ? Thinking all the time about the severity of the symptoms. ? Feeling very anxious all the time about the symptoms or your general health. ? Spending a lot of time and energy dealing with the symptoms or health concerns.  You have somatic symptoms either continuously or on and off for at least 6 months. Exams and tests will be done to rule out serious physical health problems. After that, your health care provider may refer you to a mental health specialist for psychological evaluation. Somatic symptoms can be related to a number of mental health conditions. How is this treated? This condition is  treated with one or more of the following:  Regular follow-up visits with your health care provider for evaluation and reassurance.  Counseling or talk therapy. This involves working with a mental health specialist to: ? Help you understand what triggers your symptoms. ? Help you learn some coping skills to deal with your symptoms.  Medicine. Certain medicines can help with severe anxiety or depression caused by this disorder.  Healthy lifestyle. A balanced diet and  regular exercise can reduce stress and somatic symptoms. Follow these instructions at home: Medicines  Take over-the-counter and prescription medicines only as told by your health care provider. Eating and drinking  Eat a healthy diet that includes plenty of vegetables, fruits, whole grains, low-fat dairy products, and lean protein. Do not eat a lot of foods that are high in solid fats, added sugars, or salt. General instructions  Do not use any products that contain nicotine or tobacco, such as cigarettes and e-cigarettes. If you need help quitting, ask your health care provider.  Do not use illegal drugs. Do not misuse prescription medicines.  Do not drink alcohol if your health care provider tells you not to.  Get regular exercise. Most adults should exercise for at least 150 minutes each week. Check with your health care provider before starting an exercise program.  Get the right amount and quality of sleep. Most adults need 7-9 hours of sleep each night.  Keep all follow-up visits as told by your health care provider. This is important. Contact a health care provider if:  Your pain or symptoms do not go away or they become severe.  You develop new symptoms. Get help right away if:  You have serious thoughts about hurting yourself or someone else. If you ever feel like you may hurt yourself or others, or have thoughts about taking your own life, get help right away. You can go to your nearest emergency department or call:  Your local emergency services (911 in the [place]  A suicide crisis helpline, such as the [facility] at 769-828-9235. This is open 24 hours a day. Summary  People with somatic symptom disorder have physical symptoms that cause distress or affect daily living. However, no other medical condition can be found to explain these symptoms.  Somatic symptom disorder may interfere with relationships, work, school, or other daily  activities. It may lead to frequent medical visits and many medical tests to try to find the cause of symptoms.  Exams and tests will be done to rule out serious physical health problems. After that, your health care provider may refer you to a mental health specialist for psychological evaluation. This information is not intended to replace advice given to you by your health care provider. Make sure you discuss any questions you have with your health care provider. Document Revised: 07/18/2017 Document Reviewed: 07/18/2017 Elsevier Patient Education  [place]. Iron Sucrose injection What is this medicine? IRON SUCROSE (AHY ern SOO krohs) is an iron complex. Iron is used to make healthy red blood cells, which carry oxygen and nutrients throughout the body. This medicine is used to treat iron deficiency anemia in people with chronic kidney disease. This medicine may be used for other purposes; ask your health care provider or pharmacist if you have questions. COMMON BRAND NAME(S): Venofer What should I tell my health care provider before I take this medicine? They need to know if you have any of these conditions:  anemia not caused by low iron levels  heart disease  high levels of iron in the blood  kidney disease  liver disease  an unusual or allergic reaction to iron, other medicines, foods, dyes, or preservatives  pregnant or trying to get pregnant  breast-feeding How should I use this medicine? This medicine is for infusion into a vein. It is given by a health care professional in a hospital or clinic setting. Talk to your pediatrician regarding the use of this medicine in children. While this drug may be prescribed for children as young as 2 years for selected conditions, precautions do apply. Overdosage: If you think you have taken too much of this medicine contact a poison control center or emergency room at once. NOTE: This medicine is only for you. Do not share this  medicine with others. What if I miss a dose? It is important not to miss your dose. Call your doctor or health care professional if you are unable to keep an appointment. What may interact with this medicine? Do not take this medicine with any of the following medications:  deferoxamine  dimercaprol  other iron products This medicine may also interact with the following medications:  chloramphenicol  deferasirox This list may not describe all possible interactions. Give your health care provider a list of all the medicines, herbs, non-prescription drugs, or dietary supplements you use. Also tell them if you smoke, drink alcohol, or use illegal drugs. Some items may interact with your medicine. What should I watch for while using this medicine? Visit your doctor or healthcare professional regularly. Tell your doctor or healthcare professional if your symptoms do not start to get better or if they get worse. You may need blood work done while you are taking this medicine. You may need to follow a special diet. Talk to your doctor. Foods that contain iron include: whole grains/cereals, dried fruits, beans, or peas, leafy green vegetables, and organ meats (liver, kidney). What side effects may I notice from receiving this medicine? Side effects that you should report to your doctor or health care professional as soon as possible:  allergic reactions like skin rash, itching or hives, swelling of the face, lips, or tongue  breathing problems  changes in blood pressure  cough  fast, irregular heartbeat  feeling faint or lightheaded, falls  fever or chills  flushing, sweating, or hot feelings  joint or muscle aches/pains  seizures  swelling of the ankles or feet  unusually weak or tired Side effects that usually do not require medical attention (report to your doctor or health care professional if they continue or are bothersome):  diarrhea  feeling  achy  headache  irritation at site where injected  nausea, vomiting  stomach upset  tiredness This list may not describe all possible side effects. Call your doctor for medical advice about side effects. You may report side effects to FDA at 1-800-FDA-1088. Where should I keep my medicine? This drug is given in a hospital or clinic and will not be stored at home. NOTE: This sheet is a summary. It may not cover all possible information. If you have questions about this medicine, talk to your doctor, pharmacist, or health care provider.  2020 Elsevier/Gold Standard (2011-03-17 17:14:35)

## 2019-07-15 ENCOUNTER — Inpatient Hospital Stay: Payer: PPO

## 2019-07-15 ENCOUNTER — Other Ambulatory Visit: Payer: Self-pay

## 2019-07-15 VITALS — BP 146/80 | HR 84 | Temp 97.8°F | Resp 20

## 2019-07-15 DIAGNOSIS — C7A8 Other malignant neuroendocrine tumors: Secondary | ICD-10-CM

## 2019-07-15 DIAGNOSIS — Z5112 Encounter for antineoplastic immunotherapy: Secondary | ICD-10-CM | POA: Diagnosis not present

## 2019-07-15 MED ORDER — SODIUM CHLORIDE 0.9 % IV SOLN
Freq: Once | INTRAVENOUS | Status: AC
  Filled 2019-07-15: qty 250

## 2019-07-15 MED ORDER — SODIUM CHLORIDE 0.9 % IV SOLN
200.0000 mg | Freq: Once | INTRAVENOUS | Status: AC
  Administered 2019-07-15: 14:00:00 200 mg via INTRAVENOUS
  Filled 2019-07-15: qty 10

## 2019-07-15 NOTE — Patient Instructions (Signed)

## 2019-07-17 ENCOUNTER — Inpatient Hospital Stay (HOSPITAL_BASED_OUTPATIENT_CLINIC_OR_DEPARTMENT_OTHER): Payer: PPO | Admitting: Hematology & Oncology

## 2019-07-17 ENCOUNTER — Other Ambulatory Visit: Payer: Self-pay

## 2019-07-17 ENCOUNTER — Encounter: Payer: Self-pay | Admitting: Hematology & Oncology

## 2019-07-17 ENCOUNTER — Inpatient Hospital Stay: Payer: PPO

## 2019-07-17 VITALS — BP 125/82 | HR 66 | Resp 17

## 2019-07-17 VITALS — BP 132/86 | HR 79 | Temp 97.8°F | Resp 20

## 2019-07-17 DIAGNOSIS — C4371 Malignant melanoma of right lower limb, including hip: Secondary | ICD-10-CM | POA: Diagnosis not present

## 2019-07-17 DIAGNOSIS — R112 Nausea with vomiting, unspecified: Secondary | ICD-10-CM

## 2019-07-17 DIAGNOSIS — D696 Thrombocytopenia, unspecified: Secondary | ICD-10-CM

## 2019-07-17 DIAGNOSIS — N179 Acute kidney failure, unspecified: Secondary | ICD-10-CM

## 2019-07-17 DIAGNOSIS — C7A8 Other malignant neuroendocrine tumors: Secondary | ICD-10-CM

## 2019-07-17 DIAGNOSIS — Z5112 Encounter for antineoplastic immunotherapy: Secondary | ICD-10-CM | POA: Diagnosis not present

## 2019-07-17 LAB — CBC WITH DIFFERENTIAL (CANCER CENTER ONLY)
Abs Immature Granulocytes: 0.03 10*3/uL (ref 0.00–0.07)
Basophils Absolute: 0 10*3/uL (ref 0.0–0.1)
Basophils Relative: 0 %
Eosinophils Absolute: 0.1 10*3/uL (ref 0.0–0.5)
Eosinophils Relative: 2 %
HCT: 29.8 % — ABNORMAL LOW (ref 39.0–52.0)
Hemoglobin: 9.1 g/dL — ABNORMAL LOW (ref 13.0–17.0)
Immature Granulocytes: 0 %
Lymphocytes Relative: 10 %
Lymphs Abs: 0.8 10*3/uL (ref 0.7–4.0)
MCH: 25.7 pg — ABNORMAL LOW (ref 26.0–34.0)
MCHC: 30.5 g/dL (ref 30.0–36.0)
MCV: 84.2 fL (ref 80.0–100.0)
Monocytes Absolute: 0.5 10*3/uL (ref 0.1–1.0)
Monocytes Relative: 6 %
Neutro Abs: 6.1 10*3/uL (ref 1.7–7.7)
Neutrophils Relative %: 82 %
Platelet Count: 287 10*3/uL (ref 150–400)
RBC: 3.54 MIL/uL — ABNORMAL LOW (ref 4.22–5.81)
RDW: 16.2 % — ABNORMAL HIGH (ref 11.5–15.5)
WBC Count: 7.5 10*3/uL (ref 4.0–10.5)
nRBC: 0 % (ref 0.0–0.2)

## 2019-07-17 LAB — CMP (CANCER CENTER ONLY)
ALT: 13 U/L (ref 0–44)
AST: 12 U/L — ABNORMAL LOW (ref 15–41)
Albumin: 3.7 g/dL (ref 3.5–5.0)
Alkaline Phosphatase: 66 U/L (ref 38–126)
Anion gap: 8 (ref 5–15)
BUN: 22 mg/dL — ABNORMAL HIGH (ref 6–20)
CO2: 27 mmol/L (ref 22–32)
Calcium: 8.9 mg/dL (ref 8.9–10.3)
Chloride: 105 mmol/L (ref 98–111)
Creatinine: 0.9 mg/dL (ref 0.61–1.24)
GFR, Est AFR Am: >60 mL/min (ref >60)
GFR, Estimated: >60 mL/min (ref >60)
Glucose, Bld: 89 mg/dL (ref 70–99)
Potassium: 4 mmol/L (ref 3.5–5.1)
Sodium: 140 mmol/L (ref 135–145)
Total Bilirubin: 0.5 mg/dL (ref 0.3–1.2)
Total Protein: 7 g/dL (ref 6.5–8.1)

## 2019-07-17 LAB — URINALYSIS, COMPLETE (UACMP) WITH MICROSCOPIC
Bilirubin Urine: NEGATIVE
Glucose, UA: NEGATIVE mg/dL
Hgb urine dipstick: NEGATIVE
Ketones, ur: NEGATIVE mg/dL
Leukocytes,Ua: NEGATIVE
Nitrite: NEGATIVE
Protein, ur: NEGATIVE mg/dL
Specific Gravity, Urine: >1.03 — ABNORMAL HIGH (ref 1.005–1.030)
pH: 5.5 (ref 5.0–8.0)

## 2019-07-17 LAB — AMYLASE: Amylase: 115 U/L — ABNORMAL HIGH (ref 28–100)

## 2019-07-17 LAB — LIPASE, BLOOD: Lipase: 185 U/L — ABNORMAL HIGH (ref 11–51)

## 2019-07-17 LAB — LACTATE DEHYDROGENASE: LDH: 157 U/L (ref 98–192)

## 2019-07-17 MED ORDER — SOLIFENACIN SUCCINATE 5 MG PO TABS: 5.0000 mg | ORAL_TABLET | Freq: Every day | ORAL | 4 refills | Status: DC

## 2019-07-17 MED ORDER — SODIUM CHLORIDE 0.9 % IV SOLN: 5.0000 mg/kg | Freq: Once | INTRAVENOUS | Status: DC

## 2019-07-17 MED ORDER — LEVOFLOXACIN 500 MG PO TABS: 500.0000 mg | ORAL_TABLET | Freq: Every day | ORAL | 6 refills | Status: DC

## 2019-07-17 MED ORDER — HYDROCORTISONE 5 MG PO TABS: 5.0000 mg | ORAL_TABLET | Freq: Every day | ORAL | 0 refills | Status: DC

## 2019-07-17 MED ORDER — FUSION PLUS PO CAPS: 1.0000 | ORAL_CAPSULE | Freq: Every day | ORAL | 4 refills | Status: DC

## 2019-07-17 MED ORDER — SODIUM CHLORIDE 0.9 % IV SOLN
Freq: Once | INTRAVENOUS | Status: AC
  Filled 2019-07-17: qty 250

## 2019-07-17 MED ORDER — SODIUM CHLORIDE 0.9 % IV SOLN
480.0000 mg | Freq: Once | INTRAVENOUS | Status: AC
  Administered 2019-07-17: 12:00:00 480 mg via INTRAVENOUS
  Filled 2019-07-17: qty 48

## 2019-07-17 NOTE — Progress Notes (Signed)
Hematology and Oncology Follow Up Visit  James Byrd [id number] August 22, 1965 54 y.o. 07/17/2019   Principle Diagnosis:  Stage IIIB ([id number]) subungual melanoma of the right hallux -- nodal recurrence in the RIGHT inguinal nodes -- BRAF wt Neuroendocrine carcinoma of the pancreas  Current Therapy:   Yervoy-status post 3 cycles - discontinued in September 2016 secondary to toxicity  XRT to the RIGHT inguinal basin Nivolumab 400 mg q month -- s/p cycle #6 -start in 12/2018 Somatuline 120 mg IM q month -- started on 05/22/2019   Interim History:  [name] is here today for follow-up.  Unfortunately, we still are dealing with a lot of issues with Mr. Matera.  I must say that of all patients that we have had, he clearly has had the most side effects.  Unfortunately this is just how his constitution is.  He says that he has had all the side effects from the IV iron.  As such, we will stop his IV iron.  I will try to call in some oral iron for him.  We will try Fusion-Plus and see if this might help with the iron deficiency.  I am not sure why his hemoglobin is not improved.  His hemoglobin was 9.1 today.  We checked his stools last week and they were negative for blood.  His main problem right now is this urinary frequency.  He says that he goes to the bathroom 5 times at night.  I spoke to Dr. Diona Fanti of [facility].  He will try to get Mr. Glasscock in soon so that this nocturia can be dealt with.  Again I am not sure why he would have the nocturia.  We did check a urinalysis on him today which was relatively unremarkable.  There is no obvious infection.  I suppose that there might be an enlarged prostate that could be causing this nocturia.  He is still having nausea.  He is having some vomiting.  There is diarrhea.  He cannot tolerate high-dose of steroids.  As such, I will see if we can try some infliximab to try to help with his symptoms.  Again, he does has a very unique  constitution and that he just really cannot tolerate a lot in the way of medications.  He still has a lot of the arthralgias.  He has the torn meniscus in the left knee.  He has a large Baker's cyst behind the left knee.  I am not sure orthopedic surgery is going to be to "inspired" to operate on this.  He, I do not think, has had fevers.  Both amylase and lipase are elevated.  Again I am not sure as to why this is.  He has no obvious symptoms referable to pancreatitis.  He has this neuroendocrine issue with respect to the pancreas.  He is on Somatuline for this to see if this might help.  Overall, I would have to say that his performance status is ECOG 1-2.  He does seem a little bit dehydrated.  His wife is worried about this.  She was on the cell phone with this.  I will going give him some IV fluids.  Allergies: No Known Allergies  Past Medical History, Surgical history, Social history, and Family History were reviewed and updated.  Review of Systems: Review of Systems  Constitutional: Positive for malaise/fatigue.  HENT: Negative.   Eyes: Negative.   Respiratory: Positive for shortness of breath.   Cardiovascular: Negative.   Gastrointestinal: Positive for constipation  and nausea.  Genitourinary: Negative.   Musculoskeletal: Positive for joint pain and myalgias.  Skin: Negative.   Neurological: Positive for tingling and focal weakness.  Endo/Heme/Allergies: Negative.   Psychiatric/Behavioral: Positive for memory loss.     Physical Exam:  temporal temperature is 97.8 F (36.6 C). His blood pressure is 132/86 and his pulse is 79. His respiration is 20 and oxygen saturation is 98%.   Wt Readings from Last 3 Encounters:  06/19/19 225 lb 12.8 oz (102.4 kg)  06/03/19 223 lb (101.2 kg)  05/22/19 232 lb 12.8 oz (105.6 kg)    Physical Exam Vitals reviewed.  HENT:     Head: Normocephalic and atraumatic.  Eyes:     Pupils: Pupils are equal, round, and reactive to light.    Cardiovascular:     Rate and Rhythm: Normal rate and regular rhythm.     Heart sounds: Normal heart sounds.  Pulmonary:     Effort: Pulmonary effort is normal.     Breath sounds: Normal breath sounds.  Abdominal:     General: Bowel sounds are normal.     Palpations: Abdomen is soft.     Comments: Abdominal exam shows a soft abdomen.  Bowel sounds are present.  There is no palpable abdominal mass.  There is no palpable liver or spleen tip.  He has a healing right inguinal lymphadenectomy scar.  I cannot palpate any obvious adenopathy.  The left inguinal area is unremarkable.    Musculoskeletal:        General: No tenderness or deformity. Normal range of motion.     Cervical back: Normal range of motion.     Comments: The big toe on the right foot is amputated.  There is no lymphedema in the right leg.  He has good pulses in his distal extremities.  Left leg is unremarkable.  Lymphadenopathy:     Cervical: No cervical adenopathy.  Skin:    General: Skin is warm and dry.     Findings: No erythema or rash.  Neurological:     Mental Status: He is alert and oriented to person, place, and time.  Psychiatric:        Behavior: Behavior normal.        Thought Content: Thought content normal.        Judgment: Judgment normal.      Lab Results  Component Value Date   WBC 7.5 07/17/2019   HGB 9.1 (L) 07/17/2019   HCT 29.8 (L) 07/17/2019   MCV 84.2 07/17/2019   PLT 287 07/17/2019   Lab Results  Component Value Date   FERRITIN 435 (H) 06/19/2019   IRON 11 (L) 06/19/2019   TIBC 150 (L) 06/19/2019   UIBC 139 06/19/2019   IRONPCTSAT 7 (L) 06/19/2019   Lab Results  Component Value Date   RBC 3.54 (L) 07/17/2019   No results found for: KPAFRELGTCHN, LAMBDASER, KAPLAMBRATIO No results found for: IGGSERUM, IGA, IGMSERUM No results found for: Ronnald Ramp, A1GS, A2GS, Violet Baldy, MSPIKE, SPEI   Chemistry      Component Value Date/Time   NA 140 07/17/2019 1013    NA 145 06/06/2017 1316   NA 141 10/16/2015 1334   K 4.0 07/17/2019 1013   K 4.1 06/06/2017 1316   K 3.9 10/16/2015 1334   CL 105 07/17/2019 1013   CL 108 06/06/2017 1316   CO2 27 07/17/2019 1013   CO2 26 06/06/2017 1316   CO2 21 (L) 10/16/2015 1334   BUN 22 (  H) 07/17/2019 1013   BUN 19 06/06/2017 1316   BUN 20.0 10/16/2015 1334   CREATININE 0.90 07/17/2019 1013   CREATININE 1.0 06/06/2017 1316   CREATININE 1.0 10/16/2015 1334      Component Value Date/Time   CALCIUM 8.9 07/17/2019 1013   CALCIUM 9.4 06/06/2017 1316   CALCIUM 9.7 10/16/2015 1334   ALKPHOS 66 07/17/2019 1013   ALKPHOS 78 06/06/2017 1316   ALKPHOS 62 10/16/2015 1334   AST 12 (L) 07/17/2019 1013   AST 16 10/16/2015 1334   ALT 13 07/17/2019 1013   ALT 27 06/06/2017 1316   ALT 11 10/16/2015 1334   BILITOT 0.5 07/17/2019 1013   BILITOT 0.49 10/16/2015 1334      Impression and Plan: Mr. Lewallen is a very pleasant 54 yo caucasian gentleman with history of stage IIIB melanoma of the right hallux that was subungual with one microscopic positive inguinal lymph node. He completed 3 cycles of Yervoy but stopped due to side effects.   I realize that this is a very complicated situation that we are dealing with.  I know that Mr. Hur has just had difficulties with respect to immunotherapy.    Again, there are numerous issues that we have to try to sort out.  From a clinical point of view, Mr. Nies biggest problem is this nocturia.  Hopefully, Dr. Diona Fanti will be able to handle this quickly and try to give Mr. Werber some relief so he can rest at nighttime.  Hopefully, if we can give him the infliximab, this might help with his symptoms.  I spent about 45 minutes with him today.  Again it is always incredibly detailed as to how we have to try to help Mr. Pita.  He is trying his best.  He is very motivated to get better.  His wife is definitely a excellent support system for him.  We will have to watch him closely.   I probably will have to have him come back in 2 weeks or so to see how his symptoms are.  We will go ahead with the nivolumab today.   [name], MD 1/27/202110:57 AM

## 2019-07-17 NOTE — Patient Instructions (Signed)
[facility] Discharge Instructions for Patients Receiving Chemotherapy  Today you received the following chemotherapy agents Nivolumab.  To help prevent nausea and vomiting after your treatment, we encourage you to take your nausea medication as directed.   If you develop nausea and vomiting that is not controlled by your nausea medication, call the clinic.   BELOW ARE SYMPTOMS THAT SHOULD BE REPORTED IMMEDIATELY:  *FEVER GREATER THAN 100.5 F  *CHILLS WITH OR WITHOUT FEVER  NAUSEA AND VOMITING THAT IS NOT CONTROLLED WITH YOUR NAUSEA MEDICATION  *UNUSUAL SHORTNESS OF BREATH  *UNUSUAL BRUISING OR BLEEDING  TENDERNESS IN MOUTH AND THROAT WITH OR WITHOUT PRESENCE OF ULCERS  *URINARY PROBLEMS  *BOWEL PROBLEMS  UNUSUAL RASH Items with * indicate a potential emergency and should be followed up as soon as possible.  Feel free to call the clinic you have any questions or concerns. The clinic phone number is [phone].  Please show the [place] at check-in to the Emergency Department and triage nurse.  \[id number]\ Dehydration, Adult Dehydration is condition in which there is not enough water or other fluids in the body. This happens when a person loses more fluids than he or she takes in. Important body parts cannot work right without the right amount of fluids. Any loss of fluids from the body can cause dehydration. Dehydration can be mild, worse, or very bad. It should be treated right away to keep it from getting very bad. What are the causes? This condition may be caused by:  Conditions that cause loss of water or other fluids, such as: ? Watery poop (diarrhea). ? Vomiting. ? Sweating a lot. ? Peeing (urinating) a lot.  Not drinking enough fluids, especially when you: ? Are ill. ? Are doing things that take a lot of energy to do.  Other illnesses and conditions, such as fever or infection.  Certain medicines, such as medicines that take  extra fluid out of the body (diuretics).  Lack of safe drinking water.  Not being able to get enough water and food. What increases the risk? The following factors may make you more likely to develop this condition:  Having a long-term (chronic) illness that has not been treated the right way, such as: ? Diabetes. ? Heart disease. ? Kidney disease.  Being 42 years of age or older.  Having a disability.  Living in a place that is high above the ground or sea (high in altitude). The thinner, dried air causes more fluid loss.  Doing exercises that put stress on your body for a long time. What are the signs or symptoms? Symptoms of dehydration depend on how bad it is. Mild or worse dehydration  Thirst.  Dry lips or dry mouth.  Feeling dizzy or light-headed, especially when you stand up from sitting.  Muscle cramps.  Your body making: ? Dark pee (urine). Pee may be the color of tea. ? Less pee than normal. ? Less tears than normal.  Headache. Very bad dehydration  Changes in skin. Skin may: ? Be cold to the touch (clammy). ? Be blotchy or pale. ? Not go back to normal right after you lightly pinch it and let it go.  Little or no tears, pee, or sweat.  Changes in vital signs, such as: ? Fast breathing. ? Low blood pressure. ? Weak pulse. ? Pulse that is more than 100 beats a minute when you are sitting still.  Other changes, such as: ? Feeling very thirsty. ? Eyes  that look hollow (sunken). ? Cold hands and feet. ? Being mixed up (confused). ? Being very tired (lethargic) or having trouble waking from sleep. ? Short-term weight loss. ? Loss of consciousness. How is this treated? Treatment for this condition depends on how bad it is. Treatment should start right away. Do not wait until your condition gets very bad. Very bad dehydration is an emergency. You will need to go to a hospital.  Mild or worse dehydration can be treated at home. You may be asked  to: ? Drink more fluids. ? Drink an oral rehydration solution (ORS). This drink helps get the right amounts of fluids and salts and minerals in the blood (electrolytes).  Very bad dehydration can be treated: ? With fluids through an IV tube. ? By getting normal levels of salts and minerals in your blood. This is often done by giving salts and minerals through a tube. The tube is passed through your nose and into your stomach. ? By treating the root cause. Follow these instructions at home: Oral rehydration solution If told by your doctor, drink an ORS:  Make an ORS. Use instructions on the package.  Start by drinking small amounts, about  cup (120 mL) every 5-10 minutes.  Slowly drink more until you have had the amount that your doctor said to have. Eating and drinking         Drink enough clear fluid to keep your pee pale yellow. If you were told to drink an ORS, finish the ORS first. Then, start slowly drinking other clear fluids. Drink fluids such as: ? Water. Do not drink only water. Doing that can make the salt (sodium) level in your body get too low. ? Water from ice chips you suck on. ? Fruit juice that you have added water to (diluted). ? Low-calorie sports drinks.  Eat foods that have the right amounts of salts and minerals, such as: ? Bananas. ? Oranges. ? Potatoes. ? Tomatoes. ? Spinach.  Do not drink alcohol.  Avoid: ? Drinks that have a lot of sugar. These include:  High-calorie sports drinks.  Fruit juice that you did not add water to.  Soda.  Caffeine. ? Foods that are greasy or have a lot of fat or sugar. General instructions  Take over-the-counter and prescription medicines only as told by your doctor.  Do not take salt tablets. Doing that can make the salt level in your body get too high.  Return to your normal activities as told by your doctor. Ask your doctor what activities are safe for you.  Keep all follow-up visits as told by your  doctor. This is important. Contact a doctor if:  You have pain in your belly (abdomen) and the pain: ? Gets worse. ? Stays in one place.  You have a rash.  You have a stiff neck.  You get angry or annoyed (irritable) more easily than normal.  You are more tired or have a harder time waking than normal.  You feel: ? Weak or dizzy. ? Very thirsty. Get help right away if you have:  Any symptoms of very bad dehydration.  Symptoms of vomiting, such as: ? You cannot eat or drink without vomiting. ? Your vomiting gets worse or does not go away. ? Your vomit has blood or green stuff in it.  Symptoms that get worse with treatment.  A fever.  A very bad headache.  Problems with peeing or pooping (having a bowel movement), such as: ? Watery  poop that gets worse or does not go away. ? Blood in your poop (stool). This may cause poop to look black and tarry. ? Not peeing in 6-8 hours. ? Peeing only a small amount of very dark pee in 6-8 hours.  Trouble breathing. These symptoms may be an emergency. Do not wait to see if the symptoms will go away. Get medical help right away. Call your local emergency services (911 in the [place] Do not drive yourself to the hospital. Summary  Dehydration is a condition in which there is not enough water or other fluids in the body. This happens when a person loses more fluids than he or she takes in.  Treatment for this condition depends on how bad it is. Treatment should be started right away. Do not wait until your condition gets very bad.  Drink enough clear fluid to keep your pee pale yellow. If you were told to drink an oral rehydration solution (ORS), finish the ORS first. Then, start slowly drinking other clear fluids.  Take over-the-counter and prescription medicines only as told by your doctor.  Get help right away if you have any symptoms of very bad dehydration. This information is not intended to replace advice given to you by your  health care provider. Make sure you discuss any questions you have with your health care provider. Document Revised: 01/17/2019 Document Reviewed: 01/17/2019 Elsevier Patient Education  [place].

## 2019-07-18 ENCOUNTER — Telehealth: Payer: Self-pay | Admitting: *Deleted

## 2019-07-18 ENCOUNTER — Telehealth: Payer: Self-pay | Admitting: Hematology & Oncology

## 2019-07-18 ENCOUNTER — Ambulatory Visit: Payer: PPO

## 2019-07-18 LAB — IRON AND TIBC
Iron: 24 ug/dL — ABNORMAL LOW (ref 42–163)
Saturation Ratios: 14 % — ABNORMAL LOW (ref 20–55)
TIBC: 171 ug/dL — ABNORMAL LOW (ref 202–409)
UIBC: 148 ug/dL (ref 117–376)

## 2019-07-18 LAB — TSH: TSH: 1.634 u[IU]/mL (ref 0.320–4.118)

## 2019-07-18 LAB — FERRITIN: Ferritin: 821 ng/mL — ABNORMAL HIGH (ref 24–336)

## 2019-07-18 NOTE — Telephone Encounter (Signed)
Called and advised patient of appointments added per 1/27 los

## 2019-07-18 NOTE — Telephone Encounter (Signed)
-----   Message from [name], MD sent at 07/18/2019  3:57 PM EST ----- Call - the iron is better but still low!!  The oral Fusion Plus should help!!!  James Byrd

## 2019-07-18 NOTE — Telephone Encounter (Signed)
As noted below by Dr. Marin Olp, I left a message for patient with iron results. Please continue taking the Oral Fusion Plus. Instructed him to call if he has any questions or concerns.

## 2019-07-20 DIAGNOSIS — E291 Testicular hypofunction: Secondary | ICD-10-CM | POA: Diagnosis not present

## 2019-07-20 DIAGNOSIS — E782 Mixed hyperlipidemia: Secondary | ICD-10-CM | POA: Diagnosis not present

## 2019-07-20 DIAGNOSIS — E271 Primary adrenocortical insufficiency: Secondary | ICD-10-CM | POA: Diagnosis not present

## 2019-07-22 ENCOUNTER — Telehealth: Payer: Self-pay | Admitting: *Deleted

## 2019-07-22 NOTE — Telephone Encounter (Signed)
Received a call from patients wife stating that he now has wrist pain and swelling now to where he cant hardly use his hands to open a door.  Dr. Marin Olp notified.  Really wants patient to get Remicade.  This was proposed last week with pharmacy.  Dr. Marin Olp asked to check on the status.  Pharmacist for next day notified via chat about status of Remicade.  Called patient back and explained to him the situation.  Understanding about situation and will await for a call about Remicade.

## 2019-07-26 ENCOUNTER — Telehealth: Payer: Self-pay | Admitting: *Deleted

## 2019-07-26 ENCOUNTER — Ambulatory Visit: Payer: PPO

## 2019-07-26 NOTE — Telephone Encounter (Signed)
Received voice mail message from wife stating,"James Byrd has had two Pedialyte bottles today, and he is keeping them down. He doesn't need to come in today for IVF. I'm cancelling his appointment."

## 2019-07-30 ENCOUNTER — Telehealth: Payer: Self-pay | Admitting: *Deleted

## 2019-07-30 DIAGNOSIS — N311 Reflex neuropathic bladder, not elsewhere classified: Secondary | ICD-10-CM | POA: Diagnosis not present

## 2019-07-30 DIAGNOSIS — N281 Cyst of kidney, acquired: Secondary | ICD-10-CM | POA: Diagnosis not present

## 2019-07-30 NOTE — Telephone Encounter (Signed)
Patient wife called to say patient was weak and tired.  Asked if this was worse than it has been.  States it is about the same still having swelling and aching in his joints.  Asked to get his Somatulatine shot on Friday while here for his appointment.  Said I would check with Pharmacist to see if possible.

## 2019-07-31 ENCOUNTER — Inpatient Hospital Stay (HOSPITAL_BASED_OUTPATIENT_CLINIC_OR_DEPARTMENT_OTHER): Payer: PPO | Admitting: Hematology & Oncology

## 2019-07-31 ENCOUNTER — Other Ambulatory Visit: Payer: Self-pay

## 2019-07-31 ENCOUNTER — Inpatient Hospital Stay: Payer: PPO

## 2019-07-31 ENCOUNTER — Encounter: Payer: Self-pay | Admitting: Hematology & Oncology

## 2019-07-31 ENCOUNTER — Telehealth: Payer: Self-pay | Admitting: *Deleted

## 2019-07-31 ENCOUNTER — Inpatient Hospital Stay: Payer: PPO | Attending: Hematology & Oncology

## 2019-07-31 VITALS — Wt 224.4 lb

## 2019-07-31 VITALS — BP 139/93 | HR 82 | Temp 97.5°F | Resp 18

## 2019-07-31 DIAGNOSIS — C4371 Malignant melanoma of right lower limb, including hip: Secondary | ICD-10-CM | POA: Diagnosis not present

## 2019-07-31 DIAGNOSIS — Z79899 Other long term (current) drug therapy: Secondary | ICD-10-CM | POA: Insufficient documentation

## 2019-07-31 DIAGNOSIS — R112 Nausea with vomiting, unspecified: Secondary | ICD-10-CM

## 2019-07-31 DIAGNOSIS — C7A8 Other malignant neuroendocrine tumors: Secondary | ICD-10-CM | POA: Diagnosis not present

## 2019-07-31 DIAGNOSIS — R351 Nocturia: Secondary | ICD-10-CM | POA: Insufficient documentation

## 2019-07-31 DIAGNOSIS — Z5111 Encounter for antineoplastic chemotherapy: Secondary | ICD-10-CM | POA: Insufficient documentation

## 2019-07-31 DIAGNOSIS — C774 Secondary and unspecified malignant neoplasm of inguinal and lower limb lymph nodes: Secondary | ICD-10-CM | POA: Diagnosis not present

## 2019-07-31 LAB — CBC WITH DIFFERENTIAL (CANCER CENTER ONLY)
Abs Immature Granulocytes: 0.09 10*3/uL — ABNORMAL HIGH (ref 0.00–0.07)
Basophils Absolute: 0 10*3/uL (ref 0.0–0.1)
Basophils Relative: 0 %
Eosinophils Absolute: 0.1 10*3/uL (ref 0.0–0.5)
Eosinophils Relative: 2 %
HCT: 30.6 % — ABNORMAL LOW (ref 39.0–52.0)
Hemoglobin: 9.5 g/dL — ABNORMAL LOW (ref 13.0–17.0)
Immature Granulocytes: 1 %
Lymphocytes Relative: 11 %
Lymphs Abs: 0.7 10*3/uL (ref 0.7–4.0)
MCH: 25.1 pg — ABNORMAL LOW (ref 26.0–34.0)
MCHC: 31 g/dL (ref 30.0–36.0)
MCV: 81 fL (ref 80.0–100.0)
Monocytes Absolute: 0.5 10*3/uL (ref 0.1–1.0)
Monocytes Relative: 8 %
Neutro Abs: 5.2 10*3/uL (ref 1.7–7.7)
Neutrophils Relative %: 78 %
Platelet Count: 380 10*3/uL (ref 150–400)
RBC: 3.78 MIL/uL — ABNORMAL LOW (ref 4.22–5.81)
RDW: 17.1 % — ABNORMAL HIGH (ref 11.5–15.5)
WBC Count: 6.7 10*3/uL (ref 4.0–10.5)
nRBC: 0 % (ref 0.0–0.2)

## 2019-07-31 LAB — CMP (CANCER CENTER ONLY)
ALT: 23 U/L (ref 0–44)
AST: 18 U/L (ref 15–41)
Albumin: 3.7 g/dL (ref 3.5–5.0)
Alkaline Phosphatase: 77 U/L (ref 38–126)
Anion gap: 10 (ref 5–15)
BUN: 19 mg/dL (ref 6–20)
CO2: 25 mmol/L (ref 22–32)
Calcium: 9.4 mg/dL (ref 8.9–10.3)
Chloride: 105 mmol/L (ref 98–111)
Creatinine: 0.93 mg/dL (ref 0.61–1.24)
GFR, Est AFR Am: >60 mL/min (ref >60)
GFR, Estimated: >60 mL/min (ref >60)
Glucose, Bld: 92 mg/dL (ref 70–99)
Potassium: 4.8 mmol/L (ref 3.5–5.1)
Sodium: 140 mmol/L (ref 135–145)
Total Bilirubin: 0.4 mg/dL (ref 0.3–1.2)
Total Protein: 7.1 g/dL (ref 6.5–8.1)

## 2019-07-31 LAB — RETICULOCYTES
Immature Retic Fract: 28.3 % — ABNORMAL HIGH (ref 2.3–15.9)
RBC.: 3.73 MIL/uL — ABNORMAL LOW (ref 4.22–5.81)
Retic Count, Absolute: 39.2 10*3/uL (ref 19.0–186.0)
Retic Ct Pct: 1.1 % (ref 0.4–3.1)

## 2019-07-31 LAB — AMYLASE: Amylase: 68 U/L (ref 28–100)

## 2019-07-31 LAB — SAMPLE TO BLOOD BANK

## 2019-07-31 LAB — LACTATE DEHYDROGENASE: LDH: 149 U/L (ref 98–192)

## 2019-07-31 MED ORDER — POTASSIUM CHLORIDE ER 20 MEQ PO TBCR: EXTENDED_RELEASE_TABLET | ORAL | 4 refills | Status: DC

## 2019-07-31 MED ORDER — OXYCODONE-ACETAMINOPHEN 5-325 MG PO TABS: 1.0000 | ORAL_TABLET | Freq: Three times a day (TID) | ORAL | 0 refills | Status: DC | PRN

## 2019-07-31 MED ORDER — METHYLPREDNISOLONE SODIUM SUCC 125 MG IJ SOLR
INTRAMUSCULAR | Status: AC
  Filled 2019-07-31: qty 2

## 2019-07-31 MED ORDER — METHYLPREDNISOLONE SODIUM SUCC 125 MG IJ SOLR
80.0000 mg | Freq: Once | INTRAMUSCULAR | Status: AC
  Administered 2019-07-31: 80 mg via INTRAVENOUS

## 2019-07-31 MED ORDER — SODIUM CHLORIDE 0.9 % IV SOLN
INTRAVENOUS | Status: DC
  Filled 2019-07-31 (×2): qty 250

## 2019-07-31 MED ORDER — KETOROLAC TROMETHAMINE 15 MG/ML IJ SOLN
30.0000 mg | Freq: Once | INTRAMUSCULAR | Status: DC
  Filled 2019-07-31: qty 2

## 2019-07-31 MED ORDER — LANREOTIDE ACETATE 120 MG/0.5ML ~~LOC~~ SOLN
SUBCUTANEOUS | Status: AC
  Filled 2019-07-31: qty 120

## 2019-07-31 MED ORDER — POTASSIUM CHLORIDE CRYS ER 20 MEQ PO TBCR: 40.0000 meq | EXTENDED_RELEASE_TABLET | Freq: Two times a day (BID) | ORAL | Status: DC

## 2019-07-31 MED ORDER — LANREOTIDE ACETATE 120 MG/0.5ML ~~LOC~~ SOLN: 120.0000 mg | Freq: Once | SUBCUTANEOUS | Status: DC

## 2019-07-31 MED ORDER — HYDROXYCHLOROQUINE SULFATE 200 MG PO TABS: 200.0000 mg | ORAL_TABLET | Freq: Every day | ORAL | 4 refills | Status: DC

## 2019-07-31 NOTE — Telephone Encounter (Signed)
Pt and wife called on conference call regarding the following concerns. 1-pt pain level is not being relieved by Toradol 1tablet TID 2- Treatment for Arthritis- remicade is still pending PA by Insurance 3 Pt requesting IVF today and to see MD  Reviewed concerns with MD, returned call to pt and wife. Per MD, pt to come in for IVF today at 1315, and see MD. For pain pt to take oxycodone 5/325mg  ( he still has a rx for this on hand) between Toradol to continue pain relief. Pt wife confirmed the above.   Discussed pt may be referred to Rheumatology to further evaluate and address the arthritis and possible treatments. Message to scheduling to cancel pt appt Friday and r/s for today

## 2019-07-31 NOTE — Progress Notes (Signed)
Dr. Marin Olp will give Somatuline 120 mg 2 weeks early today.  Orders re-entered per his instructions.

## 2019-07-31 NOTE — Progress Notes (Signed)
Ok to run fluids at 750 ml/hr per Dr. Marin Olp.

## 2019-07-31 NOTE — Progress Notes (Signed)
Hematology and Oncology Follow Up Visit  James Byrd [id number] 1966-06-18 54 y.o. 07/31/2019   Principle Diagnosis:  Stage IIIB ([id number]) subungual melanoma of the right hallux -- nodal recurrence in the RIGHT inguinal nodes -- BRAF wt Neuroendocrine carcinoma of the pancreas  Current Therapy:   Yervoy-status post 3 cycles - discontinued in September 2016 secondary to toxicity  XRT to the RIGHT inguinal basin Nivolumab 400 mg q month -- s/p cycle #7 -start in 12/2018 Somatuline 120 mg IM q month -- started on 05/22/2019   Interim History:  James Byrd is here today for an unscheduled visit.  He is still having a lot of issues.  Again is hard to say what is the cause of these issues.  I have his main problem is arthritis and arthropathy.  He says he is having a lot of pain in his hands.  He says he has a hard time sleeping.  There is no swelling.  There is no redness.  He feels this is from the iron that he got.  I cannot find any information about IV iron causing this kind of issue.  I suspect that this arthropathy might be from the nivolumab.  There are case reports of inflammatory arthritis secondary to immunotherapy.  I want to try him on Remicade.  However, his insurance is not going to cover this.  I am going to try him on Plaquenil.  I think this might be reasonable.  It is oral.  I know this is incredibly difficult for James Byrd.  I feel bad for him.  His quality of life is really compromised.  I cannot recall the patient ever having so many difficulties with treatment.  However, this is just how God made him.  He has had no bleeding.  He has had some diarrhea but this is not bad.  He is on oral iron pills.  I suspect he is taking these.  His iron studies still show a decreased iron level.  He is not bleeding.  I want to make sure we get him to his eye doctor to have a baseline eye exam as Plaquenil can cause some ocular issues.  He has had no cough.  He has had no  shortness of breath.  Again, his life really is being compromised by all of the pain that he has been having.  Currently, his performance status is probably ECOG 2.  Allergies: No Known Allergies  Past Medical History, Surgical history, Social history, and Family History were reviewed and updated.  Review of Systems: Review of Systems  Constitutional: Positive for malaise/fatigue.  HENT: Negative.   Eyes: Negative.   Respiratory: Positive for shortness of breath.   Cardiovascular: Negative.   Gastrointestinal: Positive for constipation and nausea.  Genitourinary: Negative.   Musculoskeletal: Positive for joint pain and myalgias.  Skin: Negative.   Neurological: Positive for tingling and focal weakness.  Endo/Heme/Allergies: Negative.   Psychiatric/Behavioral: Positive for memory loss.     Physical Exam:  weight is 224 lb 6.4 oz (101.8 kg).   Wt Readings from Last 3 Encounters:  07/31/19 224 lb 6.4 oz (101.8 kg)  06/19/19 225 lb 12.8 oz (102.4 kg)  06/03/19 223 lb (101.2 kg)    Physical Exam Vitals reviewed.  HENT:     Head: Normocephalic and atraumatic.  Eyes:     Pupils: Pupils are equal, round, and reactive to light.  Cardiovascular:     Rate and Rhythm: Normal rate and regular rhythm.  Heart sounds: Normal heart sounds.  Pulmonary:     Effort: Pulmonary effort is normal.     Breath sounds: Normal breath sounds.  Abdominal:     General: Bowel sounds are normal.     Palpations: Abdomen is soft.     Comments: Abdominal exam shows a soft abdomen.  Bowel sounds are present.  There is no palpable abdominal mass.  There is no palpable liver or spleen tip.  He has a healing right inguinal lymphadenectomy scar.  I cannot palpate any obvious adenopathy.  The left inguinal area is unremarkable.    Musculoskeletal:        General: No tenderness or deformity. Normal range of motion.     Cervical back: Normal range of motion.     Comments: The big toe on the right foot  is amputated.  There is no lymphedema in the right leg.  He has good pulses in his distal extremities.  Left leg is unremarkable.  Lymphadenopathy:     Cervical: No cervical adenopathy.  Skin:    General: Skin is warm and dry.     Findings: No erythema or rash.  Neurological:     Mental Status: He is alert and oriented to person, place, and time.  Psychiatric:        Behavior: Behavior normal.        Thought Content: Thought content normal.        Judgment: Judgment normal.      Lab Results  Component Value Date   WBC 6.7 07/31/2019   HGB 9.5 (L) 07/31/2019   HCT 30.6 (L) 07/31/2019   MCV 81.0 07/31/2019   PLT 380 07/31/2019   Lab Results  Component Value Date   FERRITIN 821 (H) 07/17/2019   IRON 24 (L) 07/17/2019   TIBC 171 (L) 07/17/2019   UIBC 148 07/17/2019   IRONPCTSAT 14 (L) 07/17/2019   Lab Results  Component Value Date   RETICCTPCT 1.1 07/31/2019   RBC 3.73 (L) 07/31/2019   No results found for: KPAFRELGTCHN, LAMBDASER, KAPLAMBRATIO No results found for: IGGSERUM, IGA, IGMSERUM No results found for: Odetta Pink, SPEI   Chemistry      Component Value Date/Time   NA 140 07/31/2019 1316   NA 145 06/06/2017 1316   NA 141 10/16/2015 1334   K 4.8 07/31/2019 1316   K 4.1 06/06/2017 1316   K 3.9 10/16/2015 1334   CL 105 07/31/2019 1316   CL 108 06/06/2017 1316   CO2 25 07/31/2019 1316   CO2 26 06/06/2017 1316   CO2 21 (L) 10/16/2015 1334   BUN 19 07/31/2019 1316   BUN 19 06/06/2017 1316   BUN 20.0 10/16/2015 1334   CREATININE 0.93 07/31/2019 1316   CREATININE 1.0 06/06/2017 1316   CREATININE 1.0 10/16/2015 1334      Component Value Date/Time   CALCIUM 9.4 07/31/2019 1316   CALCIUM 9.4 06/06/2017 1316   CALCIUM 9.7 10/16/2015 1334   ALKPHOS 77 07/31/2019 1316   ALKPHOS 78 06/06/2017 1316   ALKPHOS 62 10/16/2015 1334   AST 18 07/31/2019 1316   AST 16 10/16/2015 1334   ALT 23 07/31/2019 1316    ALT 27 06/06/2017 1316   ALT 11 10/16/2015 1334   BILITOT 0.4 07/31/2019 1316   BILITOT 0.49 10/16/2015 1334      Impression and Plan: James Byrd is a very pleasant 54 yo caucasian gentleman with history of stage IIIB melanoma of the right  hallux that was subungual with one microscopic positive inguinal lymph node. He completed 3 cycles of Yervoy but stopped due to side effects.   I realize that this is a very complicated situation that we are dealing with.  I know that James Byrd has just had difficulties with respect to immunotherapy.    I forgot to mention previously that he has seen urology.  This was because of his nocturia.  He was put on Myrbetriq.  He will get his Somatuline today.  I just really hope that the Plaquenil will help him.  It is oral.  I think it should be fairly well tolerated.  We will have him come back to see [place] for his regular appointment.  We spent about an hour with him today.  His wife is on the cell phone.  She is doing a great job trying to help [place] out with him and also trying to help him out.  James Byrd primary motivation clearly is being able to be active with his new granddaughter.  He really cannot do this right now.   [name], MD 2/10/20213:03 PM

## 2019-07-31 NOTE — Patient Instructions (Signed)
Dehydration, Adult Dehydration is a condition in which there is not enough water or other fluids in the body. This happens when a person loses more fluids than he or she takes in. Important organs, such as the kidneys, brain, and heart, cannot function without a proper amount of fluids. Any loss of fluids from the body can lead to dehydration. Dehydration can be mild, moderate, or severe. It should be treated right away to prevent it from becoming severe. What are the causes? Dehydration may be caused by:  Conditions that cause loss of water or other fluids, such as diarrhea, vomiting, or sweating or urinating a lot.  Not drinking enough fluids, especially when you are ill or doing activities that require a lot of energy.  Other illnesses and conditions, such as fever or infection.  Certain medicines, such as medicines that remove excess fluid from the body (diuretics).  Lack of safe drinking water.  Not being able to get enough water and food. What increases the risk? The following factors may make you more likely to develop this condition:  Having a long-term (chronic) illness that has not been treated properly, such as diabetes, heart disease, or kidney disease.  Being 65 years of age or older.  Having a disability.  Living in a place that is high in altitude, where thinner, drier air causes more fluid loss.  Doing exercises that put stress on your body for a long time (endurance sports). What are the signs or symptoms? Symptoms of dehydration depend on how severe it is. Mild or moderate dehydration  Thirst.  Dry lips or dry mouth.  Dizziness or light-headedness, especially when standing up from a seated position.  Muscle cramps.  Dark urine. Urine may be the color of tea.  Less urine or tears produced than usual.  Headache. Severe dehydration  Changes in skin. Your skin may be cold and clammy, blotchy, or pale. Your skin also may not return to normal after being  lightly pinched and released.  Little or no tears, urine, or sweat.  Changes in vital signs, such as rapid breathing and low blood pressure. Your pulse may be weak or may be faster than 100 beats a minute when you are sitting still.  Other changes, such as: ? Feeling very thirsty. ? Sunken eyes. ? Cold hands and feet. ? Confusion. ? Being very tired (lethargic) or having trouble waking from sleep. ? Short-term weight loss. ? Loss of consciousness. How is this diagnosed? This condition is diagnosed based on your symptoms and a physical exam. You may have blood and urine tests to help confirm the diagnosis. How is this treated? Treatment for this condition depends on how severe it is. Treatment should be started right away. Do not wait until dehydration becomes severe. Severe dehydration is an emergency and needs to be treated in a hospital.  Mild or moderate dehydration can be treated at home. You may be asked to: ? Drink more fluids. ? Drink an oral rehydration solution (ORS). This drink helps restore proper amounts of fluids and salts and minerals in the blood (electrolytes).  Severe dehydration can be treated: ? With IV fluids. ? By correcting abnormal levels of electrolytes. This is often done by giving electrolytes through a tube that is passed through your nose and into your stomach (nasogastric tube, or NG tube). ? By treating the underlying cause of dehydration. Follow these instructions at home: Oral rehydration solution If told by your health care provider, drink an ORS:  Make   an ORS by following instructions on the package.  Start by drinking small amounts, about  cup (120 mL) every 5-10 minutes.  Slowly increase how much you drink until you have taken the amount recommended by your health care provider. Eating and drinking         Drink enough clear fluid to keep your urine pale yellow. If you were told to drink an ORS, finish the ORS first and then start slowly  drinking other clear fluids. Drink fluids such as: ? Water. Do not drink only water. Doing that can lead to hyponatremia, which is having too little salt (sodium) in the body. ? Water from ice chips you suck on. ? Fruit juice that you have added water to (diluted fruit juice). ? Low-calorie sports drinks.  Eat foods that contain a healthy balance of electrolytes, such as bananas, oranges, potatoes, tomatoes, and spinach.  Do not drink alcohol.  Avoid the following: ? Drinks that contain a lot of sugar. These include high-calorie sports drinks, fruit juice that is not diluted, and soda. ? Caffeine. ? Foods that are greasy or contain a lot of fat or sugar. General instructions  Take over-the-counter and prescription medicines only as told by your health care provider.  Do not take sodium tablets. Doing that can lead to having too much sodium in the body (hypernatremia).  Return to your normal activities as told by your health care provider. Ask your health care provider what activities are safe for you.  Keep all follow-up visits as told by your health care provider. This is important. Contact a health care provider if:  You have muscle cramps, pain, or discomfort, such as: ? Pain in your abdomen and the pain gets worse or stays in one area (localizes). ? Stiff neck.  You have a rash.  You are more irritable than usual.  You are sleepier or have a harder time waking than usual.  You feel weak or dizzy.  You feel very thirsty. Get help right away if you have:  Any symptoms of severe dehydration.  Symptoms of vomiting, such as: ? You cannot eat or drink without vomiting. ? Vomiting gets worse or does not go away. ? Vomit includes blood or green matter (bile).  Symptoms that get worse with treatment.  A fever.  A severe headache.  Problems with urination or bowel movements, such as: ? Diarrhea that gets worse or does not go away. ? Blood in your stool (feces). This  may cause stool to look black and tarry. ? Not urinating, or urinating only a small amount of very dark urine, within 6-8 hours.  Trouble breathing. These symptoms may represent a serious problem that is an emergency. Do not wait to see if the symptoms will go away. Get medical help right away. Call your local emergency services (911 in the [place] Do not drive yourself to the hospital. Summary  Dehydration is a condition in which there is not enough water or other fluids in the body. This happens when a person loses more fluids than he or she takes in.  Treatment for this condition depends on how severe it is. Treatment should be started right away. Do not wait until dehydration becomes severe.  Drink enough clear fluid to keep your urine pale yellow. If you were told to drink an oral rehydration solution (ORS), finish the ORS first and then start slowly drinking other clear fluids.  Take over-the-counter and prescription medicines only as told by your health care   provider.  Get help right away if you have any symptoms of severe dehydration. This information is not intended to replace advice given to you by your health care provider. Make sure you discuss any questions you have with your health care provider. Document Revised: 01/17/2019 Document Reviewed: 01/17/2019 Elsevier Patient Education  2020 [employer]   

## 2019-08-01 ENCOUNTER — Telehealth: Payer: Self-pay | Admitting: *Deleted

## 2019-08-01 LAB — IRON AND TIBC
Iron: 21 ug/dL — ABNORMAL LOW (ref 42–163)
Saturation Ratios: 11 % — ABNORMAL LOW (ref 20–55)
TIBC: 183 ug/dL — ABNORMAL LOW (ref 202–409)
UIBC: 162 ug/dL (ref 117–376)

## 2019-08-01 LAB — FERRITIN: Ferritin: 891 ng/mL — ABNORMAL HIGH (ref 24–336)

## 2019-08-01 LAB — TSH: TSH: 1.443 u[IU]/mL (ref 0.320–4.118)

## 2019-08-01 NOTE — Telephone Encounter (Signed)
Dr. Thomes Dinning office notified that Dr. Marin Olp would like for pt to have eye exam ASAP d/t start of Plaquenil per order of Dr. Marin Olp.

## 2019-08-02 ENCOUNTER — Inpatient Hospital Stay: Payer: PPO | Admitting: Family

## 2019-08-02 ENCOUNTER — Inpatient Hospital Stay: Payer: PPO

## 2019-08-02 ENCOUNTER — Ambulatory Visit: Payer: PPO | Admitting: Hematology & Oncology

## 2019-08-02 ENCOUNTER — Other Ambulatory Visit: Payer: PPO

## 2019-08-02 ENCOUNTER — Ambulatory Visit: Payer: PPO

## 2019-08-09 ENCOUNTER — Encounter: Payer: Self-pay | Admitting: Hematology & Oncology

## 2019-08-09 ENCOUNTER — Other Ambulatory Visit: Payer: Self-pay | Admitting: *Deleted

## 2019-08-12 ENCOUNTER — Telehealth: Payer: Self-pay | Admitting: *Deleted

## 2019-08-12 NOTE — Telephone Encounter (Signed)
Message received from patient's wife to reschedule appts for this week 09-19-2022, d/t a death in the family.  Message sent to scheduling.

## 2019-08-13 ENCOUNTER — Other Ambulatory Visit: Payer: Self-pay

## 2019-08-13 ENCOUNTER — Other Ambulatory Visit: Payer: Self-pay | Admitting: *Deleted

## 2019-08-13 ENCOUNTER — Inpatient Hospital Stay (HOSPITAL_BASED_OUTPATIENT_CLINIC_OR_DEPARTMENT_OTHER): Payer: PPO | Admitting: Hematology & Oncology

## 2019-08-13 ENCOUNTER — Inpatient Hospital Stay: Payer: PPO

## 2019-08-13 ENCOUNTER — Other Ambulatory Visit: Payer: Self-pay | Admitting: Family

## 2019-08-13 ENCOUNTER — Encounter: Payer: Self-pay | Admitting: Hematology & Oncology

## 2019-08-13 VITALS — BP 94/66 | HR 94 | Temp 98.4°F | Resp 20 | Wt 220.0 lb

## 2019-08-13 VITALS — BP 104/63 | HR 66 | Temp 98.1°F | Resp 16

## 2019-08-13 DIAGNOSIS — C4371 Malignant melanoma of right lower limb, including hip: Secondary | ICD-10-CM

## 2019-08-13 DIAGNOSIS — C7A8 Other malignant neuroendocrine tumors: Secondary | ICD-10-CM

## 2019-08-13 DIAGNOSIS — Z5111 Encounter for antineoplastic chemotherapy: Secondary | ICD-10-CM | POA: Diagnosis not present

## 2019-08-13 DIAGNOSIS — D649 Anemia, unspecified: Secondary | ICD-10-CM

## 2019-08-13 LAB — CBC WITH DIFFERENTIAL (CANCER CENTER ONLY)
Abs Immature Granulocytes: 0.04 10*3/uL (ref 0.00–0.07)
Basophils Absolute: 0 10*3/uL (ref 0.0–0.1)
Basophils Relative: 0 %
Eosinophils Absolute: 0.1 10*3/uL (ref 0.0–0.5)
Eosinophils Relative: 1 %
HCT: 27.9 % — ABNORMAL LOW (ref 39.0–52.0)
Hemoglobin: 8.7 g/dL — ABNORMAL LOW (ref 13.0–17.0)
Immature Granulocytes: 0 %
Lymphocytes Relative: 6 %
Lymphs Abs: 0.8 10*3/uL (ref 0.7–4.0)
MCH: 25.1 pg — ABNORMAL LOW (ref 26.0–34.0)
MCHC: 31.2 g/dL (ref 30.0–36.0)
MCV: 80.4 fL (ref 80.0–100.0)
Monocytes Absolute: 0.6 10*3/uL (ref 0.1–1.0)
Monocytes Relative: 5 %
Neutro Abs: 10.4 10*3/uL — ABNORMAL HIGH (ref 1.7–7.7)
Neutrophils Relative %: 88 %
Platelet Count: 317 10*3/uL (ref 150–400)
RBC: 3.47 MIL/uL — ABNORMAL LOW (ref 4.22–5.81)
RDW: 17.5 % — ABNORMAL HIGH (ref 11.5–15.5)
WBC Count: 11.9 10*3/uL — ABNORMAL HIGH (ref 4.0–10.5)
nRBC: 0 % (ref 0.0–0.2)

## 2019-08-13 LAB — PREPARE RBC (CROSSMATCH)

## 2019-08-13 LAB — IRON AND TIBC
Iron: 14 ug/dL — ABNORMAL LOW (ref 42–163)
Saturation Ratios: 9 % — ABNORMAL LOW (ref 20–55)
TIBC: 165 ug/dL — ABNORMAL LOW (ref 202–409)
UIBC: 151 ug/dL (ref 117–376)

## 2019-08-13 LAB — CMP (CANCER CENTER ONLY)
ALT: 10 U/L (ref 0–44)
AST: 9 U/L — ABNORMAL LOW (ref 15–41)
Albumin: 3.7 g/dL (ref 3.5–5.0)
Alkaline Phosphatase: 67 U/L (ref 38–126)
Anion gap: 8 (ref 5–15)
BUN: 18 mg/dL (ref 6–20)
CO2: 27 mmol/L (ref 22–32)
Calcium: 8.9 mg/dL (ref 8.9–10.3)
Chloride: 101 mmol/L (ref 98–111)
Creatinine: 1.14 mg/dL (ref 0.61–1.24)
GFR, Est AFR Am: >60 mL/min (ref >60)
GFR, Estimated: >60 mL/min (ref >60)
Glucose, Bld: 120 mg/dL — ABNORMAL HIGH (ref 70–99)
Potassium: 4.3 mmol/L (ref 3.5–5.1)
Sodium: 136 mmol/L (ref 135–145)
Total Bilirubin: 0.7 mg/dL (ref 0.3–1.2)
Total Protein: 6.8 g/dL (ref 6.5–8.1)

## 2019-08-13 LAB — ABO/RH: ABO/RH(D): A POS

## 2019-08-13 LAB — SEDIMENTATION RATE: Sed Rate: 60 mm/hr — ABNORMAL HIGH (ref 0–16)

## 2019-08-13 LAB — SAMPLE TO BLOOD BANK

## 2019-08-13 LAB — LACTATE DEHYDROGENASE: LDH: 151 U/L (ref 98–192)

## 2019-08-13 LAB — FERRITIN: Ferritin: 1124 ng/mL — ABNORMAL HIGH (ref 24–336)

## 2019-08-13 MED ORDER — SODIUM CHLORIDE 0.9 % IV SOLN
Freq: Once | INTRAVENOUS | Status: AC
  Filled 2019-08-13: qty 250

## 2019-08-13 MED ORDER — KETOROLAC TROMETHAMINE 15 MG/ML IJ SOLN
INTRAMUSCULAR | Status: AC
  Filled 2019-08-13: qty 2

## 2019-08-13 MED ORDER — SODIUM CHLORIDE 0.9 % IV SOLN
480.0000 mg | Freq: Once | INTRAVENOUS | Status: AC
  Administered 2019-08-13: 480 mg via INTRAVENOUS
  Filled 2019-08-13: qty 48

## 2019-08-13 MED ORDER — FUROSEMIDE 10 MG/ML IJ SOLN: 20.0000 mg | Freq: Once | INTRAMUSCULAR | Status: DC

## 2019-08-13 MED ORDER — DIPHENHYDRAMINE HCL 25 MG PO CAPS
25.0000 mg | ORAL_CAPSULE | Freq: Once | ORAL | Status: AC
  Administered 2019-08-13: 25 mg via ORAL

## 2019-08-13 MED ORDER — ACETAMINOPHEN 325 MG PO TABS
ORAL_TABLET | ORAL | Status: AC
  Filled 2019-08-13: qty 2

## 2019-08-13 MED ORDER — ACETAMINOPHEN 325 MG PO TABS
650.0000 mg | ORAL_TABLET | Freq: Once | ORAL | Status: AC
  Administered 2019-08-13: 650 mg via ORAL

## 2019-08-13 MED ORDER — DIPHENHYDRAMINE HCL 25 MG PO CAPS
ORAL_CAPSULE | ORAL | Status: AC
  Filled 2019-08-13: qty 1

## 2019-08-13 MED ORDER — KETOROLAC TROMETHAMINE 15 MG/ML IJ SOLN
30.0000 mg | Freq: Once | INTRAMUSCULAR | Status: AC
  Administered 2019-08-13: 10:00:00 30 mg via INTRAVENOUS
  Filled 2019-08-13: qty 2

## 2019-08-13 MED ORDER — KETOROLAC TROMETHAMINE 10 MG PO TABS: 10.0000 mg | ORAL_TABLET | Freq: Three times a day (TID) | ORAL | 0 refills | Status: DC | PRN

## 2019-08-13 MED ORDER — SODIUM CHLORIDE 0.9 % IV SOLN
Freq: Once | INTRAVENOUS | Status: DC
  Filled 2019-08-13: qty 250

## 2019-08-13 NOTE — Patient Instructions (Addendum)
https://www.[facility].org/donate-blood/blood-donation-process/what-happens-to-donated-blood/blood-transfusions/types-of-[email or web address].html"> https://www.hematology.org/education/patients/blood-basics/blood-safety-and-matching"> https://www.nhlbi.nih.gov/health-topics/blood-transfusion">  Blood Transfusion, Adult A blood transfusion is a procedure in which you receive blood or a type of blood cell (blood component) through an IV. You may need a blood transfusion when your blood level is low. This may result from a bleeding disorder, illness, injury, or surgery. The blood may come from a donor. You may also be able to donate blood for yourself (autologous blood donation) before a planned surgery. The blood given in a transfusion is made up of different blood components. You may receive:  Red blood cells. These carry oxygen to the cells in the body.  Platelets. These help your blood to clot.  Plasma. This is the liquid part of your blood. It carries proteins and other substances throughout the body.  White blood cells. These help you fight infections. If you have hemophilia or another clotting disorder, you may also receive other types of blood products. Tell a health care provider about:  Any blood disorders you have.  Any previous reactions you have had during a blood transfusion.  Any allergies you have.  All medicines you are taking, including vitamins, herbs, eye drops, creams, and over-the-counter medicines.  Any surgeries you have had.  Any medical conditions you have, including any recent fever or cold symptoms.  Whether you are pregnant or may be pregnant. What are the risks? Generally, this is a safe procedure. However, problems may occur.  The most common problems include: ? A mild allergic reaction, such as red, swollen areas of skin (hives) and itching. ? Fever or chills. This may be the body's response to new blood cells received. This may occur during or up to 4  hours after the transfusion.  More serious problems may include: ? Transfusion-associated circulatory overload (TACO), or too much fluid in the lungs. This may cause breathing problems. ? A serious allergic reaction, such as difficulty breathing or swelling around the face and lips. ? Transfusion-related acute lung injury (TRALI), which causes breathing difficulty and low oxygen in the blood. This can occur within hours of the transfusion or several days later. ? Iron overload. This can happen after receiving many blood transfusions over a period of time. ? Infection or virus being transmitted. This is rare because donated blood is carefully tested before it is given. ? Hemolytic transfusion reaction. This is rare. It happens when your body's defense system (immune system)tries to attack the new blood cells. Symptoms may include fever, chills, nausea, low blood pressure, and low back or chest pain. ? Transfusion-associated graft-versus-host disease (TAGVHD). This is rare. It happens when donated cells attack your body's healthy tissues. What happens before the procedure? Medicines Ask your health care provider about:  Changing or stopping your regular medicines. This is especially important if you are taking diabetes medicines or blood thinners.  Taking medicines such as aspirin and ibuprofen. These medicines can thin your blood. Do not take these medicines unless your health care provider tells you to take them.  Taking over-the-counter medicines, vitamins, herbs, and supplements. General instructions  Follow instructions from your health care provider about eating and drinking restrictions.  You will have a blood test to determine your blood type. This is necessary to know what kind of blood your body will accept and to match it to the donor blood.  If you are going to have a planned surgery, you may be able to do an autologous blood donation. This may be done in case you need to have a  transfusion.    You will have your temperature, blood pressure, and pulse monitored before the transfusion.  If you have had an allergic reaction to a transfusion in the past, you may be given medicine to help prevent a reaction. This medicine may be given to you by mouth (orally) or through an IV.  Set aside time for the blood transfusion. This procedure generally takes 1-4 hours to complete. What happens during the procedure?   An IV will be inserted into one of your veins.  The bag of donated blood will be attached to your IV. The blood will then enter through your vein.  Your temperature, blood pressure, and pulse will be monitored regularly during the transfusion. This monitoring is done to detect early signs of a transfusion reaction.  Tell your nurse right away if you have any of these symptoms during the transfusion: ? Shortness of breath or trouble breathing. ? Chest or back pain. ? Fever or chills. ? Hives or itching.  If you have any signs or symptoms of a reaction, your transfusion will be stopped and you may be given medicine.  When the transfusion is complete, your IV will be removed.  Pressure may be applied to the IV site for a few minutes.  A bandage (dressing)will be applied. The procedure may vary among health care providers and hospitals. What happens after the procedure?  Your temperature, blood pressure, pulse, breathing rate, and blood oxygen level will be monitored until you leave the hospital or clinic.  Your blood may be tested to see how you are responding to the transfusion.  You may be warmed with fluids or blankets to maintain a normal body temperature.  If you receive your blood transfusion in an outpatient setting, you will be told whom to contact to report any reactions. Where to find more information For more information on blood transfusions, visit the [employer]: redcross.org Summary  A blood transfusion is a procedure in which you  receive blood or a type of blood cell (blood component) through an IV.  The blood you receive may come from a donor or be donated by yourself (autologous blood donation) before a planned surgery.  The blood given in a transfusion is made up of different blood components. You may receive red blood cells, platelets, plasma, or white blood cells depending on the condition treated.  Your temperature, blood pressure, and pulse will be monitored before, during, and after the transfusion.  After the transfusion, your blood may be tested to see how your body has responded. This information is not intended to replace advice given to you by your health care provider. Make sure you discuss any questions you have with your health care provider. Document Revised: 11/29/2018 Document Reviewed: 11/29/2018 Elsevier Patient Education  [place] injection What is this medicine? NIVOLUMAB (nye VOL ue mab) is a monoclonal antibody. It is used to treat colon cancer, esophageal cancer, head and neck cancer, Hodgkin lymphoma, kidney cancer, liver cancer, lung cancer, mesothelioma, melanoma, and urothelial cancer. This medicine may be used for other purposes; ask your health care provider or pharmacist if you have questions. COMMON BRAND NAME(S): Opdivo What should I tell my health care provider before I take this medicine? They need to know if you have any of these conditions:  diabetes  immune system problems  kidney disease  liver disease  lung disease  organ transplant  stomach or intestine problems  thyroid disease  an unusual or allergic reaction to nivolumab, other medicines,  foods, dyes, or preservatives  pregnant or trying to get pregnant  breast-feeding How should I use this medicine? This medicine is for infusion into a vein. It is given by a health care professional in a hospital or clinic setting. A special MedGuide will be given to you before each treatment. Be  sure to read this information carefully each time. Talk to your pediatrician regarding the use of this medicine in children. While this drug may be prescribed for children as young as 12 years for selected conditions, precautions do apply. Overdosage: If you think you have taken too much of this medicine contact a poison control center or emergency room at once. NOTE: This medicine is only for you. Do not share this medicine with others. What if I miss a dose? It is important not to miss your dose. Call your doctor or health care professional if you are unable to keep an appointment. What may interact with this medicine? Interactions have not been studied. Give your health care provider a list of all the medicines, herbs, non-prescription drugs, or dietary supplements you use. Also tell them if you smoke, drink alcohol, or use illegal drugs. Some items may interact with your medicine. This list may not describe all possible interactions. Give your health care provider a list of all the medicines, herbs, non-prescription drugs, or dietary supplements you use. Also tell them if you smoke, drink alcohol, or use illegal drugs. Some items may interact with your medicine. What should I watch for while using this medicine? This drug may make you feel generally unwell. Continue your course of treatment even though you feel ill unless your doctor tells you to stop. You may need blood work done while you are taking this medicine. Do not become pregnant while taking this medicine or for 5 months after stopping it. Women should inform their doctor if they wish to become pregnant or think they might be pregnant. There is a potential for serious side effects to an unborn child. Talk to your health care professional or pharmacist for more information. Do not breast-feed an infant while taking this medicine or for 5 months after stopping it. What side effects may I notice from receiving this medicine? Side effects  that you should report to your doctor or health care professional as soon as possible:  allergic reactions like skin rash, itching or hives, swelling of the face, lips, or tongue  breathing problems  blood in the urine  bloody or watery diarrhea or black, tarry stools  changes in emotions or moods  changes in vision  chest pain  cough  dizziness  feeling faint or lightheaded, falls  fever, chills  headache with fever, neck stiffness, confusion, loss of memory, sensitivity to light, hallucination, loss of contact with reality, or seizures  joint pain  mouth sores  redness, blistering, peeling or loosening of the skin, including inside the mouth  severe muscle pain or weakness  signs and symptoms of high blood sugar such as dizziness; dry mouth; dry skin; fruity breath; nausea; stomach pain; increased hunger or thirst; increased urination  signs and symptoms of kidney injury like trouble passing urine or change in the amount of urine  signs and symptoms of liver injury like dark yellow or brown urine; general ill feeling or flu-like symptoms; light-colored stools; loss of appetite; nausea; right upper belly pain; unusually weak or tired; yellowing of the eyes or skin  swelling of the ankles, feet, hands  trouble passing urine or change in  the amount of urine  unusually weak or tired  weight gain or loss Side effects that usually do not require medical attention (report to your doctor or health care professional if they continue or are bothersome):  bone pain  constipation  decreased appetite  diarrhea  muscle pain  nausea, vomiting  tiredness This list may not describe all possible side effects. Call your doctor for medical advice about side effects. You may report side effects to FDA at 1-800-FDA-1088. Where should I keep my medicine? This drug is given in a hospital or clinic and will not be stored at home. NOTE: This sheet is a summary. It may not  cover all possible information. If you have questions about this medicine, talk to your doctor, pharmacist, or health care provider.  2020 Elsevier/Gold Standard (2019-03-26 10:04:50)

## 2019-08-13 NOTE — Progress Notes (Signed)
Hematology and Oncology Follow Up Visit  James Byrd [id number] 05/03/1966 54 y.o. 08/13/2019   Principle Diagnosis:  Stage IIIB ([id number]) subungual melanoma of the right hallux -- nodal recurrence in the RIGHT inguinal nodes -- BRAF wt Neuroendocrine carcinoma of the pancreas  Current Therapy:   Yervoy-status post 3 cycles - discontinued in September 2016 secondary to toxicity  XRT to the RIGHT inguinal basin Nivolumab 400 mg q month -- s/p cycle #8 -start in 12/2018 Somatuline 120 mg IM q month -- started on 05/22/2019   Interim History:  James Byrd is here today for follow-up.  He still is being followed by arthralgias.  He did start the Plaquenil.  He says that this helps in the daytime.  At nighttime, he says he has a lot of pain when he wakes up.  It seems like Toradol might help him at nighttime.  He still is quite iron deficient.  He is not bleeding.  I will know if he just is not absorbing iron.  His hemoglobin is 8.7.  Given his medicine limitations and his incredible sensitivity to medications, we will have to give him a blood transfusion.  I think a transfusion will make him a whole lot more active.  His appetite seems to be doing okay.  He is having no problems with nausea or vomiting.  There is no diarrhea.  He was on oral iron.  I told him to stop the oral iron.  He has had no issues with leg swelling.  He does have the issue with the Baker's cyst behind the left knee.  I do not think this will ever be treated by surgery given his other issues.  There is been no rashes.  Overall, his performance status is probably ECOG 2.     Allergies: No Known Allergies  Past Medical History, Surgical history, Social history, and Family History were reviewed and updated.  Review of Systems: Review of Systems  Constitutional: Positive for malaise/fatigue.  HENT: Negative.   Eyes: Negative.   Respiratory: Positive for shortness of breath.   Cardiovascular: Negative.     Gastrointestinal: Positive for constipation and nausea.  Genitourinary: Negative.   Musculoskeletal: Positive for joint pain and myalgias.  Skin: Negative.   Neurological: Positive for tingling and focal weakness.  Endo/Heme/Allergies: Negative.   Psychiatric/Behavioral: Positive for memory loss.     Physical Exam:  weight is 220 lb (99.8 kg). His temporal temperature is 98.4 F (36.9 C). His blood pressure is 94/66 and his pulse is 94. His respiration is 20 and oxygen saturation is 96%.   Wt Readings from Last 3 Encounters:  08/13/19 220 lb (99.8 kg)  07/31/19 224 lb 6.4 oz (101.8 kg)  06/19/19 225 lb 12.8 oz (102.4 kg)    Physical Exam Vitals reviewed.  HENT:     Head: Normocephalic and atraumatic.  Eyes:     Pupils: Pupils are equal, round, and reactive to light.  Cardiovascular:     Rate and Rhythm: Normal rate and regular rhythm.     Heart sounds: Normal heart sounds.  Pulmonary:     Effort: Pulmonary effort is normal.     Breath sounds: Normal breath sounds.  Abdominal:     General: Bowel sounds are normal.     Palpations: Abdomen is soft.     Comments: Abdominal exam shows a soft abdomen.  Bowel sounds are present.  There is no palpable abdominal mass.  There is no palpable liver or spleen tip.  He has  a healing right inguinal lymphadenectomy scar.  I cannot palpate any obvious adenopathy.  The left inguinal area is unremarkable.    Musculoskeletal:        General: No tenderness or deformity. Normal range of motion.     Cervical back: Normal range of motion.     Comments: The big toe on the right foot is amputated.  There is no lymphedema in the right leg.  He has good pulses in his distal extremities.  Left leg is unremarkable.  Lymphadenopathy:     Cervical: No cervical adenopathy.  Skin:    General: Skin is warm and dry.     Findings: No erythema or rash.  Neurological:     Mental Status: He is alert and oriented to person, place, and time.  Psychiatric:         Behavior: Behavior normal.        Thought Content: Thought content normal.        Judgment: Judgment normal.      Lab Results  Component Value Date   WBC 11.9 (H) 08/13/2019   HGB 8.7 (L) 08/13/2019   HCT 27.9 (L) 08/13/2019   MCV 80.4 08/13/2019   PLT 317 08/13/2019   Lab Results  Component Value Date   FERRITIN 891 (H) 07/31/2019   IRON 21 (L) 07/31/2019   TIBC 183 (L) 07/31/2019   UIBC 162 07/31/2019   IRONPCTSAT 11 (L) 07/31/2019   Lab Results  Component Value Date   RETICCTPCT 1.1 07/31/2019   RBC 3.47 (L) 08/13/2019   No results found for: KPAFRELGTCHN, LAMBDASER, KAPLAMBRATIO No results found for: IGGSERUM, IGA, IGMSERUM No results found for: Odetta Pink, SPEI   Chemistry      Component Value Date/Time   NA 136 08/13/2019 0827   NA 145 06/06/2017 1316   NA 141 10/16/2015 1334   K 4.3 08/13/2019 0827   K 4.1 06/06/2017 1316   K 3.9 10/16/2015 1334   CL 101 08/13/2019 0827   CL 108 06/06/2017 1316   CO2 27 08/13/2019 0827   CO2 26 06/06/2017 1316   CO2 21 (L) 10/16/2015 1334   BUN 18 08/13/2019 0827   BUN 19 06/06/2017 1316   BUN 20.0 10/16/2015 1334   CREATININE 1.14 08/13/2019 0827   CREATININE 1.0 06/06/2017 1316   CREATININE 1.0 10/16/2015 1334      Component Value Date/Time   CALCIUM 8.9 08/13/2019 0827   CALCIUM 9.4 06/06/2017 1316   CALCIUM 9.7 10/16/2015 1334   ALKPHOS 67 08/13/2019 0827   ALKPHOS 78 06/06/2017 1316   ALKPHOS 62 10/16/2015 1334   AST 9 (L) 08/13/2019 0827   AST 16 10/16/2015 1334   ALT 10 08/13/2019 0827   ALT 27 06/06/2017 1316   ALT 11 10/16/2015 1334   BILITOT 0.7 08/13/2019 0827   BILITOT 0.49 10/16/2015 1334      Impression and Plan: James Byrd is a very pleasant 54 yo caucasian gentleman with history of stage IIIB melanoma of the right hallux that was subungual with one microscopic positive inguinal lymph node. He completed 3 cycles of Yervoy but stopped  due to side effects.   I realize that this is a very complicated situation that we are dealing with.  I know that James Byrd has just had difficulties with respect to immunotherapy.    We will go ahead with the immunotherapy today.  I will also give him his Somatuline.  He will get 2  units of blood.  Again I think this will make him feel better.  I just wish that his quality of life would improve.  I know this is his biggest priority.  I just hate that he is not able to do what he would like to do.  We will set up a PET scan in about 3 weeks.  This will be incredibly interesting to see how things look.  I spent about 45 minutes with him today.  Is always incredibly complicated with him.  His wife is on the cell phone.  A lot of issues have to be taken care of.  I just want to try to do as much as we can while he is here since it can be challenging for him to get to our office.  [name], MD 2/23/20219:13 AM

## 2019-08-14 ENCOUNTER — Inpatient Hospital Stay: Payer: PPO

## 2019-08-14 ENCOUNTER — Inpatient Hospital Stay: Payer: PPO | Admitting: Hematology & Oncology

## 2019-08-14 LAB — BPAM RBC
Blood Product Expiration Date: 202103242359
Blood Product Expiration Date: 202103242359
ISSUE DATE / TIME: 202102231137
ISSUE DATE / TIME: 202102231137
Unit Type and Rh: 6200
Unit Type and Rh: 6200

## 2019-08-14 LAB — TYPE AND SCREEN
ABO/RH(D): A POS
Antibody Screen: NEGATIVE
Unit division: 0
Unit division: 0

## 2019-08-14 LAB — CHROMOGRANIN A: Chromogranin A (ng/mL): 81.9 ng/mL (ref 0.0–101.8)

## 2019-08-16 ENCOUNTER — Other Ambulatory Visit: Payer: Self-pay | Admitting: Hematology & Oncology

## 2019-08-19 DIAGNOSIS — I2699 Other pulmonary embolism without acute cor pulmonale: Secondary | ICD-10-CM

## 2019-08-19 HISTORY — DX: Other pulmonary embolism without acute cor pulmonale: I26.99

## 2019-08-22 ENCOUNTER — Other Ambulatory Visit: Payer: Self-pay | Admitting: *Deleted

## 2019-08-22 ENCOUNTER — Telehealth: Payer: Self-pay | Admitting: *Deleted

## 2019-08-22 DIAGNOSIS — C7A8 Other malignant neuroendocrine tumors: Secondary | ICD-10-CM

## 2019-08-22 NOTE — Telephone Encounter (Signed)
Wife James Byrd called requesting a rheumatology referral for Rheumatology.  Dr. Marin Olp notified.  Will refer to [place] Deveshwar. Md.  Referral placed.

## 2019-08-26 ENCOUNTER — Telehealth: Payer: Self-pay | Admitting: *Deleted

## 2019-08-26 ENCOUNTER — Other Ambulatory Visit: Payer: Self-pay

## 2019-08-26 ENCOUNTER — Emergency Department (HOSPITAL_COMMUNITY): Payer: PPO

## 2019-08-26 ENCOUNTER — Inpatient Hospital Stay (HOSPITAL_COMMUNITY)
Admission: EM | Admit: 2019-08-26 | Discharge: 2019-08-29 | DRG: 176 | Disposition: A | Discharge status: Home Health | Payer: PPO | Attending: Internal Medicine | Admitting: Internal Medicine

## 2019-08-26 DIAGNOSIS — E2740 Unspecified adrenocortical insufficiency: Secondary | ICD-10-CM | POA: Diagnosis not present

## 2019-08-26 DIAGNOSIS — M0680 Other specified rheumatoid arthritis, unspecified site: Secondary | ICD-10-CM | POA: Diagnosis not present

## 2019-08-26 DIAGNOSIS — J9811 Atelectasis: Secondary | ICD-10-CM | POA: Diagnosis not present

## 2019-08-26 DIAGNOSIS — M109 Gout, unspecified: Secondary | ICD-10-CM | POA: Diagnosis not present

## 2019-08-26 DIAGNOSIS — I5032 Chronic diastolic (congestive) heart failure: Secondary | ICD-10-CM | POA: Diagnosis present

## 2019-08-26 DIAGNOSIS — I959 Hypotension, unspecified: Secondary | ICD-10-CM | POA: Diagnosis present

## 2019-08-26 DIAGNOSIS — R7989 Other specified abnormal findings of blood chemistry: Secondary | ICD-10-CM | POA: Diagnosis not present

## 2019-08-26 DIAGNOSIS — Z79899 Other long term (current) drug therapy: Secondary | ICD-10-CM | POA: Diagnosis not present

## 2019-08-26 DIAGNOSIS — R351 Nocturia: Secondary | ICD-10-CM | POA: Diagnosis not present

## 2019-08-26 DIAGNOSIS — C439 Malignant melanoma of skin, unspecified: Secondary | ICD-10-CM | POA: Diagnosis present

## 2019-08-26 DIAGNOSIS — Z87442 Personal history of urinary calculi: Secondary | ICD-10-CM | POA: Diagnosis not present

## 2019-08-26 DIAGNOSIS — C7889 Secondary malignant neoplasm of other digestive organs: Secondary | ICD-10-CM | POA: Diagnosis not present

## 2019-08-26 DIAGNOSIS — I2699 Other pulmonary embolism without acute cor pulmonale: Secondary | ICD-10-CM | POA: Diagnosis not present

## 2019-08-26 DIAGNOSIS — R791 Abnormal coagulation profile: Secondary | ICD-10-CM | POA: Diagnosis not present

## 2019-08-26 DIAGNOSIS — E785 Hyperlipidemia, unspecified: Secondary | ICD-10-CM | POA: Diagnosis present

## 2019-08-26 DIAGNOSIS — R52 Pain, unspecified: Secondary | ICD-10-CM | POA: Diagnosis not present

## 2019-08-26 DIAGNOSIS — M7122 Synovial cyst of popliteal space [Baker], left knee: Secondary | ICD-10-CM | POA: Diagnosis present

## 2019-08-26 DIAGNOSIS — Z9049 Acquired absence of other specified parts of digestive tract: Secondary | ICD-10-CM

## 2019-08-26 DIAGNOSIS — M7121 Synovial cyst of popliteal space [Baker], right knee: Secondary | ICD-10-CM | POA: Diagnosis not present

## 2019-08-26 DIAGNOSIS — Z923 Personal history of irradiation: Secondary | ICD-10-CM

## 2019-08-26 DIAGNOSIS — J189 Pneumonia, unspecified organism: Secondary | ICD-10-CM | POA: Diagnosis not present

## 2019-08-26 DIAGNOSIS — Z20822 Contact with and (suspected) exposure to covid-19: Secondary | ICD-10-CM | POA: Diagnosis present

## 2019-08-26 DIAGNOSIS — Z825 Family history of asthma and other chronic lower respiratory diseases: Secondary | ICD-10-CM

## 2019-08-26 DIAGNOSIS — D500 Iron deficiency anemia secondary to blood loss (chronic): Secondary | ICD-10-CM

## 2019-08-26 DIAGNOSIS — C7A8 Other malignant neuroendocrine tumors: Secondary | ICD-10-CM | POA: Diagnosis not present

## 2019-08-26 DIAGNOSIS — E611 Iron deficiency: Secondary | ICD-10-CM | POA: Diagnosis not present

## 2019-08-26 DIAGNOSIS — I2609 Other pulmonary embolism with acute cor pulmonale: Secondary | ICD-10-CM | POA: Diagnosis not present

## 2019-08-26 DIAGNOSIS — T451X5A Adverse effect of antineoplastic and immunosuppressive drugs, initial encounter: Secondary | ICD-10-CM | POA: Diagnosis not present

## 2019-08-26 DIAGNOSIS — R0902 Hypoxemia: Secondary | ICD-10-CM | POA: Diagnosis not present

## 2019-08-26 DIAGNOSIS — Z833 Family history of diabetes mellitus: Secondary | ICD-10-CM | POA: Diagnosis not present

## 2019-08-26 DIAGNOSIS — F419 Anxiety disorder, unspecified: Secondary | ICD-10-CM | POA: Diagnosis present

## 2019-08-26 DIAGNOSIS — X58XXXA Exposure to other specified factors, initial encounter: Secondary | ICD-10-CM | POA: Diagnosis present

## 2019-08-26 DIAGNOSIS — R0689 Other abnormalities of breathing: Secondary | ICD-10-CM | POA: Diagnosis not present

## 2019-08-26 DIAGNOSIS — E8809 Other disorders of plasma-protein metabolism, not elsewhere classified: Secondary | ICD-10-CM | POA: Diagnosis present

## 2019-08-26 DIAGNOSIS — Z823 Family history of stroke: Secondary | ICD-10-CM | POA: Diagnosis not present

## 2019-08-26 DIAGNOSIS — M170 Bilateral primary osteoarthritis of knee: Secondary | ICD-10-CM | POA: Diagnosis present

## 2019-08-26 DIAGNOSIS — Z89421 Acquired absence of other right toe(s): Secondary | ICD-10-CM

## 2019-08-26 DIAGNOSIS — R531 Weakness: Secondary | ICD-10-CM | POA: Diagnosis not present

## 2019-08-26 DIAGNOSIS — G4733 Obstructive sleep apnea (adult) (pediatric): Secondary | ICD-10-CM | POA: Diagnosis present

## 2019-08-26 DIAGNOSIS — R0602 Shortness of breath: Secondary | ICD-10-CM | POA: Diagnosis not present

## 2019-08-26 DIAGNOSIS — Z79891 Long term (current) use of opiate analgesic: Secondary | ICD-10-CM

## 2019-08-26 DIAGNOSIS — R079 Chest pain, unspecified: Secondary | ICD-10-CM | POA: Diagnosis not present

## 2019-08-26 DIAGNOSIS — C4371 Malignant melanoma of right lower limb, including hip: Secondary | ICD-10-CM | POA: Diagnosis not present

## 2019-08-26 LAB — COMPREHENSIVE METABOLIC PANEL
ALT: 17 U/L (ref 0–44)
AST: 22 U/L (ref 15–41)
Albumin: 3 g/dL — ABNORMAL LOW (ref 3.5–5.0)
Alkaline Phosphatase: 64 U/L (ref 38–126)
Anion gap: 8 (ref 5–15)
BUN: 16 mg/dL (ref 6–20)
CO2: 24 mmol/L (ref 22–32)
Calcium: 8.3 mg/dL — ABNORMAL LOW (ref 8.9–10.3)
Chloride: 105 mmol/L (ref 98–111)
Creatinine, Ser: 1.03 mg/dL (ref 0.61–1.24)
GFR calc Af Amer: >60 mL/min (ref >60)
GFR calc non Af Amer: >60 mL/min (ref >60)
Glucose, Bld: 109 mg/dL — ABNORMAL HIGH (ref 70–99)
Potassium: 4.8 mmol/L (ref 3.5–5.1)
Sodium: 137 mmol/L (ref 135–145)
Total Bilirubin: 0.9 mg/dL (ref 0.3–1.2)
Total Protein: 6.2 g/dL — ABNORMAL LOW (ref 6.5–8.1)

## 2019-08-26 LAB — CBC WITH DIFFERENTIAL/PLATELET
Abs Immature Granulocytes: 0.03 10*3/uL (ref 0.00–0.07)
Basophils Absolute: 0 10*3/uL (ref 0.0–0.1)
Basophils Relative: 0 %
Eosinophils Absolute: 0.1 10*3/uL (ref 0.0–0.5)
Eosinophils Relative: 2 %
HCT: 32.9 % — ABNORMAL LOW (ref 39.0–52.0)
Hemoglobin: 10.2 g/dL — ABNORMAL LOW (ref 13.0–17.0)
Immature Granulocytes: 1 %
Lymphocytes Relative: 11 %
Lymphs Abs: 0.7 10*3/uL (ref 0.7–4.0)
MCH: 25.4 pg — ABNORMAL LOW (ref 26.0–34.0)
MCHC: 31 g/dL (ref 30.0–36.0)
MCV: 82 fL (ref 80.0–100.0)
Monocytes Absolute: 0.6 10*3/uL (ref 0.1–1.0)
Monocytes Relative: 9 %
Neutro Abs: 4.9 10*3/uL (ref 1.7–7.7)
Neutrophils Relative %: 77 %
Platelets: 339 10*3/uL (ref 150–400)
RBC: 4.01 MIL/uL — ABNORMAL LOW (ref 4.22–5.81)
RDW: 17.5 % — ABNORMAL HIGH (ref 11.5–15.5)
WBC: 6.3 10*3/uL (ref 4.0–10.5)
nRBC: 0 % (ref 0.0–0.2)

## 2019-08-26 LAB — RESPIRATORY PANEL BY PCR

## 2019-08-26 LAB — PROTIME-INR
INR: 1.3 — ABNORMAL HIGH (ref 0.8–1.2)
Prothrombin Time: 16.4 seconds — ABNORMAL HIGH (ref 11.4–15.2)

## 2019-08-26 LAB — URINALYSIS, ROUTINE W REFLEX MICROSCOPIC
Bilirubin Urine: NEGATIVE
Glucose, UA: NEGATIVE mg/dL
Hgb urine dipstick: NEGATIVE
Ketones, ur: NEGATIVE mg/dL
Leukocytes,Ua: NEGATIVE
Nitrite: NEGATIVE
Protein, ur: NEGATIVE mg/dL
Specific Gravity, Urine: 1.018 (ref 1.005–1.030)
pH: 5 (ref 5.0–8.0)

## 2019-08-26 LAB — TROPONIN I (HIGH SENSITIVITY)
Troponin I (High Sensitivity): 4 ng/L (ref <18)
Troponin I (High Sensitivity): 3 ng/L (ref <18)

## 2019-08-26 LAB — PROCALCITONIN: Procalcitonin: <0.1 ng/mL

## 2019-08-26 LAB — LACTIC ACID, PLASMA
Lactic Acid, Venous: 1 mmol/L (ref 0.5–1.9)
Lactic Acid, Venous: 0.7 mmol/L (ref 0.5–1.9)

## 2019-08-26 LAB — POC SARS CORONAVIRUS 2 AG -  ED: SARS Coronavirus 2 Ag: NEGATIVE

## 2019-08-26 LAB — D-DIMER, QUANTITATIVE: D-Dimer, Quant: >20 ug/mL-FEU — ABNORMAL HIGH (ref 0.00–0.50)

## 2019-08-26 LAB — TSH: TSH: 0.806 u[IU]/mL (ref 0.350–4.500)

## 2019-08-26 LAB — HIV ANTIBODY (ROUTINE TESTING W REFLEX): HIV Screen 4th Generation wRfx: NONREACTIVE

## 2019-08-26 LAB — APTT: aPTT: 36 seconds (ref 24–36)

## 2019-08-26 LAB — MAGNESIUM: Magnesium: 1.8 mg/dL (ref 1.7–2.4)

## 2019-08-26 MED ORDER — MIRABEGRON ER 50 MG PO TB24
50.0000 mg | ORAL_TABLET | Freq: Every day | ORAL | Status: DC
  Administered 2019-08-26 – 2019-08-28 (×3): 50 mg via ORAL
  Filled 2019-08-26 (×4): qty 1

## 2019-08-26 MED ORDER — VITAMIN B-12 1000 MCG PO TABS
2000.0000 ug | ORAL_TABLET | Freq: Every day | ORAL | Status: DC
  Administered 2019-08-27 – 2019-08-29 (×3): 2000 ug via ORAL
  Filled 2019-08-26 (×3): qty 2

## 2019-08-26 MED ORDER — BENZONATATE 100 MG PO CAPS
200.0000 mg | ORAL_CAPSULE | Freq: Three times a day (TID) | ORAL | Status: DC | PRN
  Administered 2019-08-26: 200 mg via ORAL
  Filled 2019-08-26: qty 2

## 2019-08-26 MED ORDER — TESTOSTERONE 20.25 MG/ACT (1.62%) TD GEL: 20.2500 mg | TRANSDERMAL | Status: DC

## 2019-08-26 MED ORDER — GABAPENTIN 600 MG PO TABS
600.0000 mg | ORAL_TABLET | Freq: Two times a day (BID) | ORAL | Status: DC
  Administered 2019-08-26 – 2019-08-29 (×6): 600 mg via ORAL
  Filled 2019-08-26 (×7): qty 1

## 2019-08-26 MED ORDER — POTASSIUM CHLORIDE ER 10 MEQ PO TBCR
20.0000 meq | EXTENDED_RELEASE_TABLET | ORAL | Status: DC
  Administered 2019-08-28: 20 meq via ORAL
  Filled 2019-08-26 (×2): qty 2

## 2019-08-26 MED ORDER — VITAMIN B-6 100 MG PO TABS
100.0000 mg | ORAL_TABLET | Freq: Every day | ORAL | Status: DC
  Administered 2019-08-27 – 2019-08-29 (×3): 100 mg via ORAL
  Filled 2019-08-26 (×3): qty 1

## 2019-08-26 MED ORDER — VITAMIN D 25 MCG (1000 UNIT) PO TABS
2000.0000 [IU] | ORAL_TABLET | Freq: Every day | ORAL | Status: DC
  Administered 2019-08-27 – 2019-08-29 (×3): 2000 [IU] via ORAL
  Filled 2019-08-26 (×3): qty 2

## 2019-08-26 MED ORDER — SODIUM CHLORIDE 0.9 % IV SOLN
500.0000 mg | Freq: Once | INTRAVENOUS | Status: AC
  Administered 2019-08-26: 500 mg via INTRAVENOUS
  Filled 2019-08-26: qty 500

## 2019-08-26 MED ORDER — OXYCODONE-ACETAMINOPHEN 5-325 MG PO TABS: 1.0000 | ORAL_TABLET | Freq: Three times a day (TID) | ORAL | Status: DC | PRN

## 2019-08-26 MED ORDER — HEPARIN BOLUS VIA INFUSION
6800.0000 [IU] | Freq: Once | INTRAVENOUS | Status: AC
  Administered 2019-08-26: 6800 [IU] via INTRAVENOUS
  Filled 2019-08-26: qty 6800

## 2019-08-26 MED ORDER — IOHEXOL 350 MG/ML SOLN
75.0000 mL | Freq: Once | INTRAVENOUS | Status: AC | PRN
  Administered 2019-08-26: 75 mL via INTRAVENOUS

## 2019-08-26 MED ORDER — HYDROXYCHLOROQUINE SULFATE 200 MG PO TABS: 200.0000 mg | ORAL_TABLET | Freq: Every day | ORAL | Status: DC

## 2019-08-26 MED ORDER — ASCORBIC ACID 500 MG PO TABS
1000.0000 mg | ORAL_TABLET | Freq: Every day | ORAL | Status: DC
  Administered 2019-08-27 – 2019-08-29 (×3): 1000 mg via ORAL
  Filled 2019-08-26 (×3): qty 2

## 2019-08-26 MED ORDER — HYDROCORTISONE 5 MG PO TABS
5.0000 mg | ORAL_TABLET | Freq: Two times a day (BID) | ORAL | Status: DC
  Administered 2019-08-26 – 2019-08-27 (×2): 5 mg via ORAL
  Filled 2019-08-26 (×3): qty 1

## 2019-08-26 MED ORDER — KETOROLAC TROMETHAMINE 10 MG PO TABS
10.0000 mg | ORAL_TABLET | Freq: Three times a day (TID) | ORAL | Status: DC | PRN
  Administered 2019-08-27: 10 mg via ORAL
  Filled 2019-08-26 (×3): qty 1

## 2019-08-26 MED ORDER — SODIUM CHLORIDE 0.9 % IV BOLUS
1000.0000 mL | Freq: Once | INTRAVENOUS | Status: AC
  Administered 2019-08-26: 1000 mL via INTRAVENOUS

## 2019-08-26 MED ORDER — HEPARIN (PORCINE) 25000 UT/250ML-% IV SOLN
2750.0000 [IU]/h | INTRAVENOUS | Status: DC
  Administered 2019-08-26: 1650 [IU]/h via INTRAVENOUS
  Administered 2019-08-28 (×2): 2750 [IU]/h via INTRAVENOUS
  Filled 2019-08-26 (×6): qty 250

## 2019-08-26 MED ORDER — ACETAMINOPHEN 325 MG PO TABS
650.0000 mg | ORAL_TABLET | Freq: Once | ORAL | Status: AC
  Administered 2019-08-26: 650 mg via ORAL
  Filled 2019-08-26: qty 2

## 2019-08-26 MED ORDER — SODIUM CHLORIDE 0.9 % IV SOLN
1.0000 g | Freq: Once | INTRAVENOUS | Status: AC
  Administered 2019-08-26: 1 g via INTRAVENOUS
  Filled 2019-08-26: qty 10

## 2019-08-26 MED ORDER — SODIUM CHLORIDE 0.9 % IV SOLN: INTRAVENOUS | Status: DC

## 2019-08-26 NOTE — Progress Notes (Signed)
ANTICOAGULATION CONSULT NOTE - Initial Consult  Pharmacy Consult for Heparin Indication: pulmonary embolus  No Known Allergies  Patient Measurements: Height: 6' (182.9 cm) Weight: 220 lb 0.3 oz (99.8 kg) IBW/kg (Calculated) : 77.6 Heparin Dosing Weight: 97.8 kg  Vital Signs: Temp: 100.4 F (38 C) (03/08 1329) Temp Source: Rectal (03/08 1329) BP: 113/70 (03/08 1500) Pulse Rate: 86 (03/08 1500)  Labs: Recent Labs    08/26/19 1331 08/26/19 1624  HGB 10.2*  --   HCT 32.9*  --   PLT 339  --   APTT 36  --   LABPROT 16.4*  --   INR 1.3*  --   CREATININE 1.03  --   TROPONINIHS 4 3    Estimated Creatinine Clearance: 101.5 mL/min (by C-G formula based on SCr of 1.03 mg/dL).   Medical History: Past Medical History:  Diagnosis Date  . Adrenal insufficiency ([place])   . Anxiety 05/24/2012  . Complication of anesthesia    SLO TO AWAKEN MANY 8 TO 9 YRS AGO, NO ISSUES SINCE  . Goals of care, counseling/discussion 12/17/2018  . Gout   . History of kidney stones   . HLD (hyperlipidemia)   . Malignant melanoma of right great toe ([place]) 11/14/2014  . Neuroendocrine carcinoma of pancreas ([place]) 05/22/2019  . Paroxysmal supraventricular tachycardia ([place]) 05/24/2012   Described in the treadmill report; details are pending   . Sleep apnea    MILD NO CPAP NEEDED    Medications:  Scheduled:  . vitamin C  1,000 mg Oral Daily  . gabapentin  600 mg Oral BID  . heparin  6,800 Units Intravenous Once  . hydrocortisone  5 mg Oral BID  . hydroxychloroquine  200 mg Oral Daily  . mirabegron ER  50 mg Oral QHS  . Potassium Chloride ER  20 mEq Oral QODAY  . pyridOXINE  100 mg Oral Daily  . Testosterone  20.25 mg Topical See admin instructions  . cyanocobalamin  2,000 mcg Oral Daily  . Vitamin D  2,000 Units Oral Daily    Assessment: Patient is a 83 yom that presents to the ED with some chortness of breath and hypotension. The patient was found to have bilateral PEs on evaluation. Pharmacy  has been asked to dose heparin in this patient of PE.   Goal of Therapy:  Heparin level 0.3-0.7 units/ml Monitor platelets by anticoagulation protocol: Yes   Plan:  - Heparin 6800 units IV x 1 dose  - Followed by Heparin drip @ 1650 units/hr  - Heparin level in ~6 hours  - Monitor patient for s/s of bleeding and CBC while on heparin  Duanne Limerick PharmD. BCPS  08/26/2019,6:31 PM

## 2019-08-26 NOTE — Telephone Encounter (Signed)
Call received from patient's wife, Hoyle Sauer stating that pt.'s BP is 72/40 and that patient's hands are swollen.  Instructed pt.'s wife to take pt to the ER now.  Pt.'s wife states that she will take pt to the ER now.

## 2019-08-26 NOTE — ED Provider Notes (Addendum)
[facility]ERGENCY DEPARTMENT Provider Note   CSN: [id number] Arrival date & time: 08/26/19  1313    History Hypotension  James Byrd is a 54 y.o. male with past medical history significant for adrenal insufficiency, anxiety, malignant melanoma with mets to pancreas, SVT, CHF currently on chemo, last dose 2 weeks ago who presents for evaluation of generalized weakness.  Family had called EMS.  Patient was noted to be febrile and hypotensive.  Patient temp 100.4 here in the ED.  Patient has had a mild cough and intermittent SOB. On Lovenox for DVT prophylaxis. Has not missed doses.  Does admit to joint pain which patient states is chronic.  He did see oncology for this have referred him to rheumatology.  He denies any swelling or redness for his joints.  College he thinks this may be some arthropathy from his chemo.  Currently according to oncology patient has many difficulties and complications from his treatment.  Has previously had negative stool occult's.  He is on iron pills.  Has chronically low hemoglobin.  He denies any chest pain, chills, hemoptysis, unilateral leg swelling, redness, warmth, abdominal pain, melena or bright red blood per rectum.  Admits to decreased appetite and generalized weakness.  History obtained from patient and past medical records. No interpretor was used.  HPI     Past Medical History:  Diagnosis Date  . Adrenal insufficiency ([place])   . Anxiety 05/24/2012  . Complication of anesthesia    SLO TO AWAKEN MANY 8 TO 9 YRS AGO, NO ISSUES SINCE  . Goals of care, counseling/discussion 12/17/2018  . Gout   . History of kidney stones   . HLD (hyperlipidemia)   . Malignant melanoma of right great toe ([place]) 11/14/2014  . Neuroendocrine carcinoma of pancreas ([place]) 05/22/2019  . Paroxysmal supraventricular tachycardia ([place]) 05/24/2012   Described in the treadmill report; details are pending   . Sleep apnea    MILD NO CPAP NEEDED    Patient  Active Problem List   Diagnosis Date Noted  . Neuroendocrine carcinoma of pancreas ([place]) 05/22/2019  . Severe sepsis with septic shock ([place]) 01/15/2019  . Acute respiratory failure with hypoxia ([place]) 01/15/2019  . AKI (acute kidney injury) ([place]) 01/15/2019  . Septic shock ([place]) 01/13/2019  . Goals of care, counseling/discussion 12/17/2018  . Adrenal insufficiency ([place])   . Refractory nausea and vomiting   . Paranasal sinus disease   . Acute renal failure syndrome ([place])   . HCAP (healthcare-associated pneumonia) 03/06/2015  . Drug-induced pneumonitis - [facility]ERVOY   . Thrombocytopenia ([place]) 02/09/2015  . HLD (hyperlipidemia)   . Chronic diastolic congestive heart failure ([place])   . Malignant melanoma of right great toe Stage IIIB ([id number]) 11/14/2014  . Obstructive sleep apnea 07/19/2012  . Paroxysmal supraventricular tachycardia ([place]) 05/24/2012  . Chest pressure 05/24/2012  . Palpitations 05/24/2012  . Anxiety 05/24/2012    Past Surgical History:  Procedure Laterality Date  . CHOLECYSTECTOMY    . ESOPHAGOGASTRODUODENOSCOPY (EGD) WITH PROPOFOL N/A 05/13/2019   Procedure: ESOPHAGOGASTRODUODENOSCOPY (EGD) WITH PROPOFOL;  Surgeon: Arta Silence, MD;  Location: [facility];  Service: Endoscopy;  Laterality: N/A;  . EXTRACORPOREAL SHOCK WAVE LITHOTRIPSY  2016  . FINE NEEDLE ASPIRATION N/A 05/13/2019   Procedure: FINE NEEDLE ASPIRATION (FNA) LINEAR;  Surgeon: Arta Silence, MD;  Location: [facility];  Service: Endoscopy;  Laterality: N/A;  . LYMPH NODE BIOPSY Right 11/06/2018   Procedure: RIGHT INGUINAL LYMPH NODE BIOPSY AND POSSIBLE RIGHT GROIN NODE DISECTION;  Surgeon:  Alphonsa Overall, MD;  Location: [facility] ORS;  Service: General;  Laterality: Right;  . r toe partial ambutation    . ROTATOR CUFF REPAIR Right 2010   3 yrs ago  . UPPER ESOPHAGEAL ENDOSCOPIC ULTRASOUND (EUS) N/A 05/13/2019   Procedure: UPPER ESOPHAGEAL ENDOSCOPIC ULTRASOUND (EUS);  Surgeon: Arta Silence, MD;  Location:  [facility];  Service: Endoscopy;  Laterality: N/A;       Family History  Adopted: Yes  Problem Relation Age of Onset  . Stroke Mother   . COPD Father   . Diabetes Father   . Diabetes Sister   . Diabetes Brother     Social History   Tobacco Use  . Smoking status: Never Smoker  . Smokeless tobacco: Never Used  Substance Use Topics  . Alcohol use: No    Alcohol/week: 0.0 standard drinks  . Drug use: No    Home Medications Prior to Admission medications   Medication Sig Start Date End Date Taking? Authorizing Provider  acetaminophen (TYLENOL) 650 MG CR tablet Take 1,300 mg by mouth daily as needed for pain.   Yes [provider]  Ascorbic Acid (VITAMIN C) 1000 MG tablet Take 1,000 mg by mouth daily.   Yes [provider]  augmented betamethasone dipropionate (DIPROLENE-AF) 0.05 % cream Apply 1 application topically 2 (two) times daily as needed (psoriasis).  09/29/15  Yes [provider]  benzonatate (TESSALON) 200 MG capsule Take 200 mg by mouth 3 (three) times daily as needed for cough.  06/21/19  Yes [provider]  Cholecalciferol (VITAMIN D) 2000 units tablet Take 2,000 Units by mouth daily.   Yes [provider]  cyanocobalamin 2000 MCG tablet Take 2,000 mcg by mouth daily. Vitamin b12   Yes [provider]  diclofenac sodium (VOLTAREN) 1 % GEL Apply 2 g topically daily as needed (pain).  10/23/18  Yes [provider]  gabapentin (NEURONTIN) 600 MG tablet Take 600 mg by mouth 2 (two) times daily.    Yes [provider]  GLUCOSAMINE-CHONDROITIN PO Take 1 tablet by mouth 2 (two) times daily.    Yes [provider]  hydrocortisone (CORTEF) 5 MG tablet Take 1 tablet (5 mg total) by mouth daily. Patient taking differently: Take 5 mg by mouth 2 (two) times daily.  07/17/19  Yes [name], MD  hydroxychloroquine (PLAQUENIL) 200 MG tablet Take 1 tablet (200 mg total) by mouth daily. 07/31/19  Yes  [name], MD  Iron-FA-B Cmp-C-Biot-Probiotic (FUSION PLUS) CAPS Take 1 capsule by mouth daily.   Yes [provider]  ketorolac (TORADOL) 10 MG tablet Take 1 tablet (10 mg total) by mouth every 8 (eight) hours as needed. Patient taking differently: Take 10 mg by mouth every 8 (eight) hours as needed (pain).  08/13/19  Yes Ennever, Rudell Cobb, MD  levofloxacin (LEVAQUIN) 500 MG tablet Take 1 tablet (500 mg total) by mouth daily. Patient taking differently: Take 500 mg by mouth See admin instructions. Take one tablet (500 mg) by mouth daily for 14 days following chemo therapy (chemo treatments are monthly) 07/17/19  Yes Ennever, Rudell Cobb, MD  mirabegron ER (MYRBETRIQ) 50 MG TB24 tablet Take 50 mg by mouth at bedtime.   Yes [provider]  Multiple Vitamin (MULTIVITAMIN WITH MINERALS) TABS tablet Take 1 tablet by mouth daily.   Yes [provider]  ondansetron (ZOFRAN ODT) 4 MG disintegrating tablet Take 1 tablet (4 mg total) by mouth every 8 (eight) hours as needed for  nausea or vomiting. 05/23/19  Yes [name], MD  oxyCODONE-acetaminophen (PERCOCET/ROXICET) 5-325 MG tablet Take 1 tablet by mouth every 8 (eight) hours as needed for severe pain. Patient is unsure of dosage. Patient taking differently: Take 1 tablet by mouth every 8 (eight) hours as needed for severe pain.  07/31/19  Yes [name], MD  Potassium Chloride ER 20 MEQ TBCR Take 1 pill every other day Patient taking differently: Take 20 mEq by mouth every other day.  07/31/19  Yes [name], MD  pyridOXINE (VITAMIN B-6) 100 MG tablet Take 100 mg by mouth daily.   Yes [provider]  Testosterone 20.25 MG/ACT (1.62%) GEL Apply 20.25 mg topically See admin instructions. Apply 1 pump (20.25 mg) topically on each shoulder once daily 03/25/19  Yes [provider]  promethazine (PHENERGAN) 25 MG suppository Place 1 suppository (25 mg total) rectally every 6 (six) hours as needed for  nausea or vomiting. Patient not taking: Reported on 07/17/2019 05/23/19   [name], MD    Allergies    Patient has no known allergies.  Review of Systems   Review of Systems  Constitutional: Positive for activity change, appetite change, chills and fatigue.  HENT: Negative.   Respiratory: Positive for cough and shortness of breath (Intermittent). Negative for apnea, choking, chest tightness, wheezing and stridor.   Cardiovascular: Negative.   Gastrointestinal: Negative.   Genitourinary: Negative.   Musculoskeletal: Positive for arthralgias (Chronic). Negative for back pain, gait problem, joint swelling, myalgias, neck pain and neck stiffness.  Neurological: Positive for weakness (Generalized) and light-headedness. Negative for dizziness, tremors, seizures, syncope, facial asymmetry, speech difficulty, numbness and headaches.  All other systems reviewed and are negative.   Physical Exam Updated Vital Signs BP 113/70   Pulse 86   Temp (!) 100.4 F (38 C) (Rectal)   Resp 11   SpO2 95%   Physical Exam Vitals and nursing note reviewed.  Constitutional:      General: He is not in acute distress.    Appearance: He is well-developed. He is ill-appearing. He is not toxic-appearing or diaphoretic.  HENT:     Head: Normocephalic and atraumatic.     Nose: Nose normal.     Mouth/Throat:     Comments: Dry Eyes:     Pupils: Pupils are equal, round, and reactive to light.  Cardiovascular:     Rate and Rhythm: Normal rate and regular rhythm.     Pulses: Normal pulses.     Heart sounds: Normal heart sounds.  Pulmonary:     Effort: Pulmonary effort is normal. No respiratory distress.     Breath sounds: Normal breath sounds.     Comments: Speaks in full sentences without difficulty. Abdominal:     General: There is no distension.     Palpations: Abdomen is soft.     Comments: Soft non tender without rebound or guarding  Musculoskeletal:        General: Normal range of motion.       Cervical back: Normal range of motion and neck supple. No rigidity or tenderness.     Comments: Moves all 4 extremities without difficulty. Homans sign negative. No DVT on exam. Great toe on right with partial amputation.  Lymphadenopathy:     Cervical: No cervical adenopathy.  Skin:    General: Skin is warm and dry.     Comments: Cap refill 2-3 seconds  Neurological:     General: No focal deficit present.  Mental Status: He is alert and oriented to person, place, and time.     Comments: Unable to assess gait    ED Results / Procedures / Treatments   Labs (all labs ordered are listed, but only abnormal results are displayed) Labs Reviewed  COMPREHENSIVE METABOLIC PANEL - Abnormal; Notable for the following components:      Result Value   Glucose, Bld 109 (*)    Calcium 8.3 (*)    Total Protein 6.2 (*)    Albumin 3.0 (*)    All other components within normal limits  CBC WITH DIFFERENTIAL/PLATELET - Abnormal; Notable for the following components:   RBC 4.01 (*)    Hemoglobin 10.2 (*)    HCT 32.9 (*)    MCH 25.4 (*)    RDW 17.5 (*)    All other components within normal limits  PROTIME-INR - Abnormal; Notable for the following components:   Prothrombin Time 16.4 (*)    INR 1.3 (*)    All other components within normal limits  D-DIMER, QUANTITATIVE (NOT AT [facility]) - Abnormal; Notable for the following components:   D-Dimer, Quant >20.00 (*)    All other components within normal limits  CULTURE, BLOOD (ROUTINE X 2)  CULTURE, BLOOD (ROUTINE X 2)  URINE CULTURE  LACTIC ACID, PLASMA  APTT  URINALYSIS, ROUTINE W REFLEX MICROSCOPIC  LACTIC ACID, PLASMA  POC SARS CORONAVIRUS 2 AG -  ED  TROPONIN I (HIGH SENSITIVITY)  TROPONIN I (HIGH SENSITIVITY)    EKG None  Radiology DG Chest Port 1 View  Result Date: 08/26/2019 CLINICAL DATA:  Hypotension EXAM: PORTABLE CHEST 1 VIEW COMPARISON:  January 15, 2019. FINDINGS: There is ill-defined airspace opacity in the right base  region. There is mild left base atelectasis. Heart size and pulmonary vascularity are within normal limits. No adenopathy. No pneumothorax. No bone lesions. IMPRESSION: Ill-defined airspace opacity in the right base which may represent a degree of pneumonia. No associated consolidation. Atypical organism pneumonia could present in this manner. Mild left base atelectasis. Cardiac silhouette within normal limits.  No evident adenopathy. Electronically Signed   By: Lowella Grip III M.D.   On: 08/26/2019 14:07    Procedures .Critical Care Performed by: Nettie Elm, PA-C Authorized by: Nettie Elm, PA-C   Critical care provider statement:    Critical care time (minutes):  32   Critical care was necessary to treat or prevent imminent or life-threatening deterioration of the following conditions:  Circulatory failure   Critical care was time spent personally by me on the following activities:  Discussions with consultants, evaluation of patient's response to treatment, examination of patient, ordering and performing treatments and interventions, ordering and review of laboratory studies, ordering and review of radiographic studies, pulse oximetry, re-evaluation of patient's condition, obtaining history from patient or surrogate and review of old charts   (including critical care time)  Medications Ordered in ED Medications  cefTRIAXone (ROCEPHIN) 1 g in sodium chloride 0.9 % 100 mL IVPB (has no administration in time range)  azithromycin (ZITHROMAX) 500 mg in sodium chloride 0.9 % 250 mL IVPB (has no administration in time range)  acetaminophen (TYLENOL) tablet 650 mg (has no administration in time range)  sodium chloride 0.9 % bolus 1,000 mL (1,000 mLs Intravenous New Bag/Given 08/26/19 1411)    ED Course  I have reviewed the triage vital signs and the nursing notes.  Pertinent labs & imaging results that were available during my care of the patient were reviewed  by me and  considered in my medical decision making (see chart for details).  54 year old appears non septic presents for evaluation of weakness and hypotension. Currently undergoing treatment for melanoma with mets to pancreas. Per oncology note patient has had some moderate generalized weakness, arthralgias and many complications from his chemotherapy.  Patient's blood pressure with EMS was 90 systolic.  According to patient he had a blood pressure 70 systolic at home.  Rectal temp here 916.6, systolic 060.  No tachycardia.  No evidence of rashes or lesions.  Heart and lungs clear.  Abdomen soft.  Does have some diffuse tenderness to his joints however no overlying erythema, warmth or obvious swelling.  No obvious effusion.  No labs, imaging, IV fluids and reevaluate  Labs and imaging personally interpreted Troponin negative Metabolic panel, CBC at baseline Lactic acid 1.0 UA negative Covid negative DG chest with ill-defined opacity questioning pneumonia.  Given productive cough, low-grade temp will give antibiotics  1450: Patient reassessed. Coughing up green sputum in room. No hypoxia discussed possible atypical PNA on chest xray. Will order ABX. Ddimer elevated at >20. Will order CTA chest to r/o PE given known CA and SOB.   Care transferred to Dr. Clyde Canterbury who will follow up on remaining labs, imaging and determine disposition.   MDM Rules/Calculators/A&P                       Final Clinical Impression(s) / ED Diagnoses Final diagnoses:  Atypical pneumonia  Elevated d-dimer    Rx / DC Orders ED Discharge Orders    None       Caleb Decock A, PA-C 08/26/19 1532    Pattricia Boss, MD 08/26/19 1900    Ayshia Gramlich A, PA-C 08/26/19 1903    Pattricia Boss, MD 08/26/19 1906

## 2019-08-26 NOTE — Plan of Care (Cosign Needed)
EDUCATED

## 2019-08-26 NOTE — ED Triage Notes (Signed)
Pt bib ems from home. Currently on chemo. Last tx approx 2 weeks ago. Pt with mild shob and generalized weakness. Ems 101.3, HR 96, RR 22. Pt currently on levaquin prophylactically. 20g R hand. 500cc NS given pta.

## 2019-08-26 NOTE — H&P (Signed)
History and Physical    Nayel Purdy KVQ:[id number] DOB: Jun 09, 1966 DOA: 08/26/2019  PCP: Venetia Maxon, Sharon Mt, MD  Patient coming from: Home  I have personally briefly reviewed patient's old medical records in [place]  Chief Complaint: Weakness, low blood pressure, cough  HPI: James Byrd is a 54 y.o. male with medical history significant of multiple comorbidities including adrenal insufficiency, anxiety, arthritis, malignant melanoma to the pancreas who is currently getting monthly chemotherapy presented to ED with complaint of weakness and hypotension.  According to patient, he has been having cough for the last 2 to 3 months but that has gotten worse since last 1 to 2 days and he has been feeling weak since last 1 to 2 days.  He checked his blood pressure today and it was 70/40 so EMS was called.  Patient's temperature was 100.4 at the scene.  He was brought into the emergency department.  He endorses some shortness of breath with exertion.  He has no other complaints such as chills, sweating, any problem with urination, abdominal pain or any problem with bowel movement.  He denies any recent travel history.  ED Course: Upon arrival to ED, his blood pressure was 140/92 and his temperature was 100.4.  BMP was unremarkable.  UA unremarkable.  Initial chest x-ray shows possibility of right lower lobe pneumonia.  Patient received IV Rocephin and Zithromax.  D-dimer was significantly elevated so CT angiogram of the chest was done which showed bilateral PE with possible lung infarction but no infiltrate/pneumonia.  Hospital service was consulted for admission.  Review of Systems: As per HPI otherwise negative.     Past Medical History:  Diagnosis Date  . Adrenal insufficiency ([place])   . Anxiety 05/24/2012  . Complication of anesthesia    SLO TO AWAKEN MANY 8 TO 9 YRS AGO, NO ISSUES SINCE  . Goals of care, counseling/discussion 12/17/2018  . Gout   . History of kidney stones   . HLD  (hyperlipidemia)   . Malignant melanoma of right great toe ([place]) 11/14/2014  . Neuroendocrine carcinoma of pancreas ([place]) 05/22/2019  . Paroxysmal supraventricular tachycardia ([place]) 05/24/2012   Described in the treadmill report; details are pending   . Sleep apnea    MILD NO CPAP NEEDED    Past Surgical History:  Procedure Laterality Date  . CHOLECYSTECTOMY    . ESOPHAGOGASTRODUODENOSCOPY (EGD) WITH PROPOFOL N/A 05/13/2019   Procedure: ESOPHAGOGASTRODUODENOSCOPY (EGD) WITH PROPOFOL;  Surgeon: Arta Silence, MD;  Location: [facility];  Service: Endoscopy;  Laterality: N/A;  . EXTRACORPOREAL SHOCK WAVE LITHOTRIPSY  2016  . FINE NEEDLE ASPIRATION N/A 05/13/2019   Procedure: FINE NEEDLE ASPIRATION (FNA) LINEAR;  Surgeon: Arta Silence, MD;  Location: [facility];  Service: Endoscopy;  Laterality: N/A;  . LYMPH NODE BIOPSY Right 11/06/2018   Procedure: RIGHT INGUINAL LYMPH NODE BIOPSY AND POSSIBLE RIGHT GROIN NODE DISECTION;  Surgeon: Alphonsa Overall, MD;  Location: [facility];  Service: General;  Laterality: Right;  . r toe partial ambutation    . ROTATOR CUFF REPAIR Right 2010   3 yrs ago  . UPPER ESOPHAGEAL ENDOSCOPIC ULTRASOUND (EUS) N/A 05/13/2019   Procedure: UPPER ESOPHAGEAL ENDOSCOPIC ULTRASOUND (EUS);  Surgeon: Arta Silence, MD;  Location: [facility];  Service: Endoscopy;  Laterality: N/A;     reports that he has never smoked. He has never used smokeless tobacco. He reports that he does not drink alcohol or use drugs.  No Known Allergies  Family History  Adopted: Yes  Problem Relation Age  of Onset  . Stroke Mother   . COPD Father   . Diabetes Father   . Diabetes Sister   . Diabetes Brother     Prior to Admission medications   Medication Sig Start Date End Date Taking? Authorizing Provider  acetaminophen (TYLENOL) 650 MG CR tablet Take 1,300 mg by mouth daily as needed for pain.   Yes [provider]  Ascorbic Acid (VITAMIN C) 1000 MG tablet Take 1,000 mg by  mouth daily.   Yes [provider]  augmented betamethasone dipropionate (DIPROLENE-AF) 0.05 % cream Apply 1 application topically 2 (two) times daily as needed (psoriasis).  09/29/15  Yes [provider]  benzonatate (TESSALON) 200 MG capsule Take 200 mg by mouth 3 (three) times daily as needed for cough.  06/21/19  Yes [provider]  Cholecalciferol (VITAMIN D) 2000 units tablet Take 2,000 Units by mouth daily.   Yes [provider]  cyanocobalamin 2000 MCG tablet Take 2,000 mcg by mouth daily. Vitamin b12   Yes [provider]  diclofenac sodium (VOLTAREN) 1 % GEL Apply 2 g topically daily as needed (pain).  10/23/18  Yes [provider]  gabapentin (NEURONTIN) 600 MG tablet Take 600 mg by mouth 2 (two) times daily.    Yes [provider]  GLUCOSAMINE-CHONDROITIN PO Take 1 tablet by mouth 2 (two) times daily.    Yes [provider]  hydrocortisone (CORTEF) 5 MG tablet Take 1 tablet (5 mg total) by mouth daily. Patient taking differently: Take 5 mg by mouth 2 (two) times daily.  07/17/19  Yes [name], MD  hydroxychloroquine (PLAQUENIL) 200 MG tablet Take 1 tablet (200 mg total) by mouth daily. 07/31/19  Yes [name], MD  Iron-FA-B Cmp-C-Biot-Probiotic (FUSION PLUS) CAPS Take 1 capsule by mouth daily.   Yes [provider]  ketorolac (TORADOL) 10 MG tablet Take 1 tablet (10 mg total) by mouth every 8 (eight) hours as needed. Patient taking differently: Take 10 mg by mouth every 8 (eight) hours as needed (pain).  08/13/19  Yes Ennever, Rudell Cobb, MD  levofloxacin (LEVAQUIN) 500 MG tablet Take 1 tablet (500 mg total) by mouth daily. Patient taking differently: Take 500 mg by mouth See admin instructions. Take one tablet (500 mg) by mouth daily for 14 days following chemo therapy (chemo treatments are monthly) 07/17/19  Yes Ennever, Rudell Cobb, MD  mirabegron ER (MYRBETRIQ) 50 MG TB24 tablet Take 50 mg by mouth at  bedtime.   Yes [provider]  Multiple Vitamin (MULTIVITAMIN WITH MINERALS) TABS tablet Take 1 tablet by mouth daily.   Yes [provider]  ondansetron (ZOFRAN ODT) 4 MG disintegrating tablet Take 1 tablet (4 mg total) by mouth every 8 (eight) hours as needed for nausea or vomiting. 05/23/19  Yes [name], MD  oxyCODONE-acetaminophen (PERCOCET/ROXICET) 5-325 MG tablet Take 1 tablet by mouth every 8 (eight) hours as needed for severe pain. Patient is unsure of dosage. Patient taking differently: Take 1 tablet by mouth every 8 (eight) hours as needed for severe pain.  07/31/19  Yes [name], MD  Potassium Chloride ER 20 MEQ TBCR Take 1 pill every other day Patient taking differently: Take 20 mEq by mouth every other day.  07/31/19  Yes [name], MD  pyridOXINE (VITAMIN B-6) 100 MG tablet Take 100 mg by mouth daily.   Yes [provider]  Testosterone 20.25 MG/ACT (1.62%) GEL Apply 20.25 mg topically See admin instructions.  Apply 1 pump (20.25 mg) topically on each shoulder once daily 03/25/19  Yes [provider]  promethazine (PHENERGAN) 25 MG suppository Place 1 suppository (25 mg total) rectally every 6 (six) hours as needed for nausea or vomiting. Patient not taking: Reported on 07/17/2019 05/23/19   [name], MD    Physical Exam: Vitals:   08/26/19 1330 08/26/19 1415 08/26/19 1445 08/26/19 1500  BP: 104/69  115/76 113/70  Pulse: 85 87 83 86  Resp: 17 18 (!) 21 11  Temp:      TempSrc:      SpO2: 98% 100% 97% 95%    Constitutional: NAD, calm, comfortable Vitals:   08/26/19 1330 08/26/19 1415 08/26/19 1445 08/26/19 1500  BP: 104/69  115/76 113/70  Pulse: 85 87 83 86  Resp: 17 18 (!) 21 11  Temp:      TempSrc:      SpO2: 98% 100% 97% 95%   Eyes: PERRL, lids and conjunctivae normal ENMT: Mucous membranes are dry, posterior pharynx clear of any exudate or lesions.Normal dentition.  Neck: normal, supple, no masses, no  thyromegaly Respiratory: Diminished breath sounds at the bases, no wheezing, no crackles. Normal respiratory effort. No accessory muscle use.  Cardiovascular: Regular rate and rhythm, no murmurs / rubs / gallops. No extremity edema. 2+ pedal pulses. No carotid bruits.  Abdomen: no tenderness, no masses palpated. No hepatosplenomegaly. Bowel sounds positive.  Musculoskeletal: no clubbing / cyanosis. No joint deformity upper and lower extremities. Good ROM, no contractures. Normal muscle tone.  Skin: no rashes, lesions, ulcers. No induration Neurologic: CN 2-12 grossly intact. Sensation intact, DTR normal. Strength 5/5 in all 4.  Psychiatric: Normal judgment and insight. Alert and oriented x 3. Normal mood.    Labs on Admission: I have personally reviewed following labs and imaging studies  CBC: Recent Labs  Lab 08/26/19 1331  WBC 6.3  NEUTROABS 4.9  HGB 10.2*  HCT 32.9*  MCV 82.0  PLT 710   Basic Metabolic Panel: Recent Labs  Lab 08/26/19 1331  NA 137  K 4.8  CL 105  CO2 24  GLUCOSE 109*  BUN 16  CREATININE 1.03  CALCIUM 8.3*   GFR: Estimated Creatinine Clearance: 101.5 mL/min (by C-G formula based on SCr of 1.03 mg/dL). Liver Function Tests: Recent Labs  Lab 08/26/19 1331  AST 22  ALT 17  ALKPHOS 64  BILITOT 0.9  PROT 6.2*  ALBUMIN 3.0*   No results for input(s): LIPASE, AMYLASE in the last 168 hours. No results for input(s): AMMONIA in the last 168 hours. Coagulation Profile: Recent Labs  Lab 08/26/19 1331  INR 1.3*   Cardiac Enzymes: No results for input(s): CKTOTAL, CKMB, CKMBINDEX, TROPONINI in the last 168 hours. BNP (last 3 results) No results for input(s): PROBNP in the last 8760 hours. HbA1C: No results for input(s): HGBA1C in the last 72 hours. CBG: No results for input(s): GLUCAP in the last 168 hours. Lipid Profile: No results for input(s): CHOL, HDL, LDLCALC, TRIG, CHOLHDL, LDLDIRECT in the last 72 hours. Thyroid Function Tests: No  results for input(s): TSH, T4TOTAL, FREET4, T3FREE, THYROIDAB in the last 72 hours. Anemia Panel: No results for input(s): VITAMINB12, FOLATE, FERRITIN, TIBC, IRON, RETICCTPCT in the last 72 hours. Urine analysis:    Component Value Date/Time   COLORURINE YELLOW 08/26/2019 1344   APPEARANCEUR CLEAR 08/26/2019 1344   LABSPEC 1.018 08/26/2019 1344   PHURINE 5.0 08/26/2019 1344   GLUCOSEU NEGATIVE 08/26/2019 1344   HGBUR NEGATIVE 08/26/2019  [place] 08/26/2019 1344   [place] 08/26/2019 1344   PROTEINUR NEGATIVE 08/26/2019 1344   UROBILINOGEN 0.2 03/06/2015 0928   NITRITE NEGATIVE 08/26/2019 1344   LEUKOCYTESUR NEGATIVE 08/26/2019 1344    Radiological Exams on Admission: CT Angio Chest PE W/Cm &/Or Wo Cm  Result Date: 08/26/2019 CLINICAL DATA:  Dyspnea.  Possible pulmonary embolism. EXAM: CT ANGIOGRAPHY CHEST WITH CONTRAST TECHNIQUE: Multidetector CT imaging of the chest was performed using the standard protocol during bolus administration of intravenous contrast. Multiplanar CT image reconstructions and MIPs were obtained to evaluate the vascular anatomy. Automatic exposure control utilized. CONTRAST:  48mL OMNIPAQUE IOHEXOL 350 MG/ML SOLN COMPARISON:  January 24, 2019 FINDINGS: Cardiovascular: There are peripheral pulmonary emboli bilaterally with Hampton's humps in the lung bases and lingula. The pulmonary trunk, right pulmonary artery, left pulmonary artery, and bilateral lobar pulmonary arteries are patent without contrast filling defects. There is no right heart strain. Paucity of coronary calcification. Relatively small caliber of the right main coronary artery from the ostia. Mild calcified atherosclerosis of the thoracic aorta arch. A 3.5 cm caliber of the tubular aorta. No thoracic aortic dissection. Mediastinum/Nodes: A reactive-appearing borderline enlarged 12 mm short axis diameter right hilar noncalcified lymph node. No mediastinal adenopathy. Diminutive  thyroid. Lungs/Pleura: No pulmonary edema. Patchy consolidation in both lungs, concerning for ischemia. Two 2 cm thick-walled cavitary lesions in the right base, probably ischemic. No emphysema. Upper Abdomen: A 3.5 cm left hemiliver and 2.0 cm right hemiliver simple-appearing cortical cysts, similarly sized compared to August 2020 and slightly increased in size is compared to the March 14, 2015 CT abdomen study, likely benign hepatic cysts. The bilateral adrenal glands are incompletely imaged. Musculoskeletal: Mild to moderate degenerative changes most pronounced at the lower cervical levels, and a T12 benign bone island. Review of the MIP images confirms the above findings. Critical Value/emergent results were called by telephone at the time of interpretation on 08/26/2019 at 4:21 pm to provider [facility] , who verbally acknowledged these results. IMPRESSION: Bilateral peripheral pulmonary emboli without right heart strain. Two 2 cm thick-walled cystic lesions in the right base, probably ischemic. Bilateral Hampton's humps. Unenhanced CT chest recommended in 3 months. A reactive-appearing borderline enlarged right hilar noncalcified lymph node. Paucity of coronary calcification. Relatively small caliber of the right main coronary artery from the ostia. Thoracic aortic calcified atherosclerosis. Electronically Signed   By: Revonda Humphrey   On: 08/26/2019 16:22   DG Chest Port 1 View  Result Date: 08/26/2019 CLINICAL DATA:  Hypotension EXAM: PORTABLE CHEST 1 VIEW COMPARISON:  January 15, 2019. FINDINGS: There is ill-defined airspace opacity in the right base region. There is mild left base atelectasis. Heart size and pulmonary vascularity are within normal limits. No adenopathy. No pneumothorax. No bone lesions. IMPRESSION: Ill-defined airspace opacity in the right base which may represent a degree of pneumonia. No associated consolidation. Atypical organism pneumonia could present in this manner. Mild left  base atelectasis. Cardiac silhouette within normal limits.  No evident adenopathy. Electronically Signed   By: Lowella Grip III M.D.   On: 08/26/2019 14:07    EKG: Independently reviewed.  Sinus rhythm with ST flattening.  Assessment/Plan Active Problems:   HCAP (healthcare-associated pneumonia)   Adrenal insufficiency (HCC)   Bilateral pulmonary embolism (HCC)    Bilateral PE with lung infarction: We will start patient on heparin drip.  We discussed potential risks and benefits of anticoagulant and patient agrees to this treatment modality.  We will check  transthoracic echo.  If no heart strain and patient feels well then likely switch to oral anticoagulant tomorrow.  We will also check Doppler lower extremity ultrasound.  Initial chest x-ray showed possible pneumonia however CT chest shows infarction.  I do not think he has pneumonia.  He received antibiotics in the ER.  Will forego antibiotics for now.  Check procalcitonin.  Adrenal insufficiency: I do not think he is having adrenal crisis.  His blood pressure was low at home likely secondary to PE.  Will resume home dose of prednisone and watch closely.  Generalized weakness: Likely secondary to PE.  Will consult PT OT.   DVT prophylaxis: Heparin GTT Code Status: Full code Family Communication: None present at bedside.  Plan of care discussed with patient in length and he verbalized understanding and agreed with it. Disposition Plan: Admit to the hospital.  Likely discharge home tomorrow Consults called: None Admission status: Observation   Darliss Cheney MD [facility]  08/26/2019, 5:15 PM  To contact the attending provider between 7A-7P or the covering provider during after hours 7P-7A, please log into the web site [email or web address]

## 2019-08-26 NOTE — ED Notes (Signed)
Attempted to call report; advised that nurse not available and requested that I call back in 5 minutes

## 2019-08-26 NOTE — ED Provider Notes (Signed)
  Physical Exam  BP 113/70   Pulse 86   Temp (!) 100.4 F (38 C) (Rectal)   Resp 11   SpO2 95%   Physical Exam Vitals and nursing note reviewed.  Constitutional:      Appearance: Normal appearance. He is well-developed.  HENT:     Head: Normocephalic and atraumatic.     Mouth/Throat:     Mouth: Mucous membranes are dry.  Eyes:     Conjunctiva/sclera: Conjunctivae normal.  Cardiovascular:     Rate and Rhythm: Normal rate and regular rhythm.     Heart sounds: No murmur.  Pulmonary:     Effort: Pulmonary effort is normal. No respiratory distress.     Breath sounds: Normal breath sounds.  Abdominal:     Palpations: Abdomen is soft.     Tenderness: There is no abdominal tenderness.  Musculoskeletal:     Cervical back: Normal range of motion.  Skin:    General: Skin is warm and dry.  Neurological:     General: No focal deficit present.     Mental Status: He is alert.  Psychiatric:        Mood and Affect: Mood normal.     ED Course/Procedures     Procedures  MDM  Assumed care of Mr. Dematteo from previous provider at 1500. In brief he has a PMH of metastatic melanoma with pancreatic metastasis presenting to the ED due to fatigue, generalized weakness and an episode of hypotension at his home this AM. He endorses SOB and nonproductive cough but has not required any supplemental O2 in the ED. He has also been normotensive in the ED today.  CBC with Hgb 10.2. CMP with mild hypoalbuminemia. Initial troponin 4. CXR with "Ill-define airspace opacity in the R base which may represent a degree of PNA." COVID neg. Pt given abx for CAP. Ddimer >20. CTA PE ordered by previous provider. Plan at handoff is to f/u CTA and likely admit   CTA shows b/l PE with R heart strain. There are also "two 2cm thick walled lesions in R base, probably ischemic." At this time, we feel as though pt would benefit from admission for anticoagulant optimization and continuation of IV abx.   Pt admitted to  Hospitalist Service. For details on hospital course following admission, please refer to inpatient team's note. Pt stable at time of admission.  Pt assessed and evaluated with Dr. Dayna Barker.  Nadeen Landau, MD       Nadeen Landau, MD 08/27/19 [id number]    Merrily Pew, MD 08/30/19 [phone]

## 2019-08-27 ENCOUNTER — Observation Stay (HOSPITAL_COMMUNITY): Payer: PPO

## 2019-08-27 DIAGNOSIS — C7A8 Other malignant neuroendocrine tumors: Secondary | ICD-10-CM

## 2019-08-27 DIAGNOSIS — R351 Nocturia: Secondary | ICD-10-CM

## 2019-08-27 DIAGNOSIS — Z9049 Acquired absence of other specified parts of digestive tract: Secondary | ICD-10-CM | POA: Diagnosis not present

## 2019-08-27 DIAGNOSIS — M109 Gout, unspecified: Secondary | ICD-10-CM | POA: Diagnosis present

## 2019-08-27 DIAGNOSIS — C4371 Malignant melanoma of right lower limb, including hip: Secondary | ICD-10-CM | POA: Diagnosis not present

## 2019-08-27 DIAGNOSIS — M0680 Other specified rheumatoid arthritis, unspecified site: Secondary | ICD-10-CM | POA: Diagnosis present

## 2019-08-27 DIAGNOSIS — M170 Bilateral primary osteoarthritis of knee: Secondary | ICD-10-CM | POA: Diagnosis present

## 2019-08-27 DIAGNOSIS — Z79899 Other long term (current) drug therapy: Secondary | ICD-10-CM | POA: Diagnosis not present

## 2019-08-27 DIAGNOSIS — M7122 Synovial cyst of popliteal space [Baker], left knee: Secondary | ICD-10-CM | POA: Diagnosis present

## 2019-08-27 DIAGNOSIS — M7121 Synovial cyst of popliteal space [Baker], right knee: Secondary | ICD-10-CM | POA: Diagnosis present

## 2019-08-27 DIAGNOSIS — E2740 Unspecified adrenocortical insufficiency: Secondary | ICD-10-CM | POA: Diagnosis present

## 2019-08-27 DIAGNOSIS — F419 Anxiety disorder, unspecified: Secondary | ICD-10-CM | POA: Diagnosis present

## 2019-08-27 DIAGNOSIS — R7989 Other specified abnormal findings of blood chemistry: Secondary | ICD-10-CM

## 2019-08-27 DIAGNOSIS — E611 Iron deficiency: Secondary | ICD-10-CM | POA: Diagnosis present

## 2019-08-27 DIAGNOSIS — C7889 Secondary malignant neoplasm of other digestive organs: Secondary | ICD-10-CM | POA: Diagnosis present

## 2019-08-27 DIAGNOSIS — Z20822 Contact with and (suspected) exposure to covid-19: Secondary | ICD-10-CM | POA: Diagnosis present

## 2019-08-27 DIAGNOSIS — X58XXXA Exposure to other specified factors, initial encounter: Secondary | ICD-10-CM | POA: Diagnosis present

## 2019-08-27 DIAGNOSIS — I959 Hypotension, unspecified: Secondary | ICD-10-CM | POA: Diagnosis present

## 2019-08-27 DIAGNOSIS — E785 Hyperlipidemia, unspecified: Secondary | ICD-10-CM | POA: Diagnosis present

## 2019-08-27 DIAGNOSIS — Z87442 Personal history of urinary calculi: Secondary | ICD-10-CM | POA: Diagnosis not present

## 2019-08-27 DIAGNOSIS — Z823 Family history of stroke: Secondary | ICD-10-CM | POA: Diagnosis not present

## 2019-08-27 DIAGNOSIS — R52 Pain, unspecified: Secondary | ICD-10-CM

## 2019-08-27 DIAGNOSIS — J189 Pneumonia, unspecified organism: Secondary | ICD-10-CM | POA: Diagnosis not present

## 2019-08-27 DIAGNOSIS — I2699 Other pulmonary embolism without acute cor pulmonale: Secondary | ICD-10-CM | POA: Diagnosis present

## 2019-08-27 DIAGNOSIS — Z79891 Long term (current) use of opiate analgesic: Secondary | ICD-10-CM | POA: Diagnosis not present

## 2019-08-27 DIAGNOSIS — C439 Malignant melanoma of skin, unspecified: Secondary | ICD-10-CM | POA: Diagnosis present

## 2019-08-27 DIAGNOSIS — R079 Chest pain, unspecified: Secondary | ICD-10-CM

## 2019-08-27 DIAGNOSIS — Z833 Family history of diabetes mellitus: Secondary | ICD-10-CM | POA: Diagnosis not present

## 2019-08-27 DIAGNOSIS — I2609 Other pulmonary embolism with acute cor pulmonale: Secondary | ICD-10-CM

## 2019-08-27 DIAGNOSIS — Z825 Family history of asthma and other chronic lower respiratory diseases: Secondary | ICD-10-CM | POA: Diagnosis not present

## 2019-08-27 DIAGNOSIS — Z923 Personal history of irradiation: Secondary | ICD-10-CM | POA: Diagnosis not present

## 2019-08-27 DIAGNOSIS — T451X5A Adverse effect of antineoplastic and immunosuppressive drugs, initial encounter: Secondary | ICD-10-CM | POA: Diagnosis present

## 2019-08-27 DIAGNOSIS — I5032 Chronic diastolic (congestive) heart failure: Secondary | ICD-10-CM | POA: Diagnosis present

## 2019-08-27 LAB — CBC WITH DIFFERENTIAL/PLATELET
Abs Immature Granulocytes: 0.02 10*3/uL (ref 0.00–0.07)
Basophils Absolute: 0 10*3/uL (ref 0.0–0.1)
Basophils Relative: 0 %
Eosinophils Absolute: 0.2 10*3/uL (ref 0.0–0.5)
Eosinophils Relative: 3 %
HCT: 31.8 % — ABNORMAL LOW (ref 39.0–52.0)
Hemoglobin: 10.1 g/dL — ABNORMAL LOW (ref 13.0–17.0)
Immature Granulocytes: 0 %
Lymphocytes Relative: 14 %
Lymphs Abs: 0.9 10*3/uL (ref 0.7–4.0)
MCH: 25.4 pg — ABNORMAL LOW (ref 26.0–34.0)
MCHC: 31.8 g/dL (ref 30.0–36.0)
MCV: 80.1 fL (ref 80.0–100.0)
Monocytes Absolute: 0.5 10*3/uL (ref 0.1–1.0)
Monocytes Relative: 8 %
Neutro Abs: 4.4 10*3/uL (ref 1.7–7.7)
Neutrophils Relative %: 75 %
Platelets: 321 10*3/uL (ref 150–400)
RBC: 3.97 MIL/uL — ABNORMAL LOW (ref 4.22–5.81)
RDW: 17.2 % — ABNORMAL HIGH (ref 11.5–15.5)
WBC: 6 10*3/uL (ref 4.0–10.5)
nRBC: 0 % (ref 0.0–0.2)

## 2019-08-27 LAB — URINE CULTURE: Culture: NO GROWTH

## 2019-08-27 LAB — COMPREHENSIVE METABOLIC PANEL
ALT: 15 U/L (ref 0–44)
AST: 13 U/L — ABNORMAL LOW (ref 15–41)
Albumin: 3.1 g/dL — ABNORMAL LOW (ref 3.5–5.0)
Alkaline Phosphatase: 62 U/L (ref 38–126)
Anion gap: 12 (ref 5–15)
BUN: 15 mg/dL (ref 6–20)
CO2: 22 mmol/L (ref 22–32)
Calcium: 8.6 mg/dL — ABNORMAL LOW (ref 8.9–10.3)
Chloride: 105 mmol/L (ref 98–111)
Creatinine, Ser: 0.99 mg/dL (ref 0.61–1.24)
GFR calc Af Amer: >60 mL/min (ref >60)
GFR calc non Af Amer: >60 mL/min (ref >60)
Glucose, Bld: 107 mg/dL — ABNORMAL HIGH (ref 70–99)
Potassium: 4 mmol/L (ref 3.5–5.1)
Sodium: 139 mmol/L (ref 135–145)
Total Bilirubin: 0.5 mg/dL (ref 0.3–1.2)
Total Protein: 6.1 g/dL — ABNORMAL LOW (ref 6.5–8.1)

## 2019-08-27 LAB — FERRITIN: Ferritin: 745 ng/mL — ABNORMAL HIGH (ref 24–336)

## 2019-08-27 LAB — HEPARIN LEVEL (UNFRACTIONATED)
Heparin Unfractionated: 0.1 IU/mL — ABNORMAL LOW (ref 0.30–0.70)
Heparin Unfractionated: 0.18 IU/mL — ABNORMAL LOW (ref 0.30–0.70)
Heparin Unfractionated: 0.24 IU/mL — ABNORMAL LOW (ref 0.30–0.70)

## 2019-08-27 LAB — SEDIMENTATION RATE: Sed Rate: 50 mm/hr — ABNORMAL HIGH (ref 0–16)

## 2019-08-27 LAB — IRON AND TIBC
Iron: 22 ug/dL — ABNORMAL LOW (ref 45–182)
Saturation Ratios: 12 % — ABNORMAL LOW (ref 17.9–39.5)
TIBC: 178 ug/dL — ABNORMAL LOW (ref 250–450)
UIBC: 156 ug/dL

## 2019-08-27 LAB — ANTITHROMBIN III: AntiThromb III Func: 78 % (ref 75–120)

## 2019-08-27 MED ORDER — SODIUM CHLORIDE 0.9 % IV SOLN: INTRAVENOUS | Status: AC

## 2019-08-27 MED ORDER — HEPARIN BOLUS VIA INFUSION
2500.0000 [IU] | Freq: Once | INTRAVENOUS | Status: AC
  Administered 2019-08-27: 2500 [IU] via INTRAVENOUS
  Filled 2019-08-27: qty 2500

## 2019-08-27 MED ORDER — TECHNETIUM TC 99M MEDRONATE IV KIT
20.0000 | PACK | Freq: Once | INTRAVENOUS | Status: AC | PRN
  Administered 2019-08-27: 20 via INTRAVENOUS

## 2019-08-27 MED ORDER — SODIUM CHLORIDE 0.9% FLUSH
10.0000 mL | INTRAVENOUS | Status: DC | PRN
  Administered 2019-08-27: 10 mL via INTRAVENOUS

## 2019-08-27 MED ORDER — HEPARIN BOLUS VIA INFUSION
3000.0000 [IU] | Freq: Once | INTRAVENOUS | Status: AC
  Administered 2019-08-27: 3000 [IU] via INTRAVENOUS
  Filled 2019-08-27: qty 3000

## 2019-08-27 MED ORDER — HYDROCORTISONE NA SUCCINATE PF 100 MG IJ SOLR
50.0000 mg | Freq: Three times a day (TID) | INTRAMUSCULAR | Status: DC
  Administered 2019-08-27 – 2019-08-28 (×4): 50 mg via INTRAVENOUS
  Filled 2019-08-27 (×4): qty 2

## 2019-08-27 NOTE — Consult Note (Signed)
Referral MD  Reason for Referral: Bilateral pulmonary embolism; recurrent melanoma in the right inguinal area  No chief complaint on file. : My blood pressure was low at home.  HPI: James Byrd is well-known to me.  He is a 54 year old white male.  He has history of localized recurrent melanoma.  He had melanoma of the right hallux probably about 5 or 6 years ago.  He did have a positive sentinel node at that time.  He was treated with immunotherapy and almost died from this.  He, without question had the worst time that I could think of with immunotherapy of all patients that we have treated.  He then had a localized recurrence in the right inguinal area.  This was resected.  He did have radiation therapy to this area.  He now is on single agent nivolumab.  He has had problems with iron deficiency.  He has had IV iron.  He has developed this diffuse arthralgia.  This has been debilitating.  He needs to see rheumatology I think.  We tried him on Plaquenil which has really not helped.  His wife called yesterday saying that the blood pressure was quite low.  We got him to the emergency room.  He has had issues with adrenal insufficiency.  He subsequently underwent a CT angiogram because of an elevated D-dimer.  He was found to have bilateral pulmonary emboli.  He is admitted now.  He is on IV heparin.  His labs today show white cell count of 6.  Hemoglobin 10.1.  Platelet count 321,000.  MCV is 80.  His BUN is 15 creatinine 0.99.  Calcium 8.6 with an albumin of 3.1.  He is set up for Dopplers of his leg this morning.  His oxygen saturation seems to be doing pretty well.  He is on no supplemental oxygen.  His had no cough.  There is no hemoptysis.  He denies any obvious dyspnea.  There is been no leg swelling.  He has had no abdominal pain.  There is been no change in bowel or bladder habits.  Overall, I was his performance status is ECOG 2.    Past Medical History:  Diagnosis Date  .  Adrenal insufficiency ([place])   . Anxiety 05/24/2012  . Complication of anesthesia    SLO TO AWAKEN MANY 8 TO 9 YRS AGO, NO ISSUES SINCE  . Goals of care, counseling/discussion 12/17/2018  . Gout   . History of kidney stones   . HLD (hyperlipidemia)   . Malignant melanoma of right great toe ([place]) 11/14/2014  . Neuroendocrine carcinoma of pancreas ([place]) 05/22/2019  . Paroxysmal supraventricular tachycardia ([place]) 05/24/2012   Described in the treadmill report; details are pending   . Sleep apnea    MILD NO CPAP NEEDED  :  Past Surgical History:  Procedure Laterality Date  . CHOLECYSTECTOMY    . ESOPHAGOGASTRODUODENOSCOPY (EGD) WITH PROPOFOL N/A 05/13/2019   Procedure: ESOPHAGOGASTRODUODENOSCOPY (EGD) WITH PROPOFOL;  Surgeon: Arta Silence, MD;  Location: [facility];  Service: Endoscopy;  Laterality: N/A;  . EXTRACORPOREAL SHOCK WAVE LITHOTRIPSY  2016  . FINE NEEDLE ASPIRATION N/A 05/13/2019   Procedure: FINE NEEDLE ASPIRATION (FNA) LINEAR;  Surgeon: Arta Silence, MD;  Location: [facility];  Service: Endoscopy;  Laterality: N/A;  . LYMPH NODE BIOPSY Right 11/06/2018   Procedure: RIGHT INGUINAL LYMPH NODE BIOPSY AND POSSIBLE RIGHT GROIN NODE DISECTION;  Surgeon: Alphonsa Overall, MD;  Location: [facility];  Service: General;  Laterality: Right;  . r toe  partial ambutation    . ROTATOR CUFF REPAIR Right 2010   3 yrs ago  . UPPER ESOPHAGEAL ENDOSCOPIC ULTRASOUND (EUS) N/A 05/13/2019   Procedure: UPPER ESOPHAGEAL ENDOSCOPIC ULTRASOUND (EUS);  Surgeon: Arta Silence, MD;  Location: [facility];  Service: Endoscopy;  Laterality: N/A;  :   Current Facility-Administered Medications:  .  0.9 %  sodium chloride infusion, , Intravenous, Continuous, Pahwani, Ravi, MD .  ascorbic acid (VITAMIN C) tablet 1,000 mg, 1,000 mg, Oral, Daily, Pahwani, Ravi, MD .  benzonatate (TESSALON) capsule 200 mg, 200 mg, Oral, TID PRN, Darliss Cheney, MD, 200 mg at 08/26/19 2356 .  cholecalciferol (VITAMIN D3) tablet  2,000 Units, 2,000 Units, Oral, Daily, Pahwani, Ravi, MD .  gabapentin (NEURONTIN) tablet 600 mg, 600 mg, Oral, BID, Pahwani, Ravi, MD, 600 mg at 08/26/19 2356 .  heparin ADULT infusion 100 units/mL (25000 units/273mL sodium chloride 0.45%), 2,050 Units/hr, Intravenous, Continuous, Pahwani, Rinka R, MD, Last Rate: 20.5 mL/hr at 08/27/19 0132, 2,050 Units/hr at 08/27/19 0132 .  hydrocortisone (CORTEF) tablet 5 mg, 5 mg, Oral, BID, Pahwani, Ravi, MD, 5 mg at 08/26/19 2356 .  hydroxychloroquine (PLAQUENIL) tablet 200 mg, 200 mg, Oral, Daily, Pahwani, Ravi, MD .  ketorolac (TORADOL) tablet 10 mg, 10 mg, Oral, Q8H PRN, Darliss Cheney, MD, 10 mg at 08/27/19 0549 .  mirabegron ER (MYRBETRIQ) tablet 50 mg, 50 mg, Oral, QHS, Pahwani, Ravi, MD, 50 mg at 08/26/19 2356 .  oxyCODONE-acetaminophen (PERCOCET/ROXICET) 5-325 MG per tablet 1 tablet, 1 tablet, Oral, Q8H PRN, Darliss Cheney, MD .  Derrill Memo ON 08/28/2019] potassium chloride (KLOR-CON) CR tablet 20 mEq, 20 mEq, Oral, QODAY, Pahwani, Ravi, MD .  pyridOXINE (VITAMIN B-6) tablet 100 mg, 100 mg, Oral, Daily, Pahwani, Ravi, MD .  vitamin B-12 (CYANOCOBALAMIN) tablet 2,000 mcg, 2,000 mcg, Oral, Daily, Pahwani, Ravi, MD  Facility-Administered Medications Ordered in Other Encounters:  .  0.9 %  sodium chloride infusion, , Intravenous, Continuous, Elishia Kaczorowski, Rudell Cobb, MD, Last Rate: 500 mL/hr at 02/05/15 1510, New Bag at 02/05/15 1510:  . vitamin C  1,000 mg Oral Daily  . cholecalciferol  2,000 Units Oral Daily  . gabapentin  600 mg Oral BID  . hydrocortisone  5 mg Oral BID  . hydroxychloroquine  200 mg Oral Daily  . mirabegron ER  50 mg Oral QHS  . [START ON 08/28/2019] potassium chloride  20 mEq Oral QODAY  . pyridOXINE  100 mg Oral Daily  . cyanocobalamin  2,000 mcg Oral Daily  :  No Known Allergies:  Family History  Adopted: Yes  Problem Relation Age of Onset  . Stroke Mother   . COPD Father   . Diabetes Father   . Diabetes Sister   . Diabetes  Brother   :  Social History   Socioeconomic History  . Marital status: Married    Spouse name: Not on file  . Number of children: Not on file  . Years of education: Not on file  . Highest education level: Not on file  Occupational History  . Not on file  Tobacco Use  . Smoking status: Never Smoker  . Smokeless tobacco: Never Used  Substance and Sexual Activity  . Alcohol use: No    Alcohol/week: 0.0 standard drinks  . Drug use: No  . Sexual activity: Yes  Other Topics Concern  . Not on file  Social History Narrative   Lives with wife in a one story home.  Has 2 children.  On disability.  Education: high school.+  Social Determinants of Health   Financial Resource Strain:   . Difficulty of Paying Living Expenses: Not on file  Food Insecurity:   . Worried About Charity fundraiser in the Last Year: Not on file  . Ran Out of Food in the Last Year: Not on file  Transportation Needs:   . Lack of Transportation (Medical): Not on file  . Lack of Transportation (Non-Medical): Not on file  Physical Activity:   . Days of Exercise per Week: Not on file  . Minutes of Exercise per Session: Not on file  Stress:   . Feeling of Stress : Not on file  Social Connections:   . Frequency of Communication with Friends and Family: Not on file  . Frequency of Social Gatherings with Friends and Family: Not on file  . Attends Religious Services: Not on file  . Active Member of Clubs or Organizations: Not on file  . Attends Archivist Meetings: Not on file  . Marital Status: Not on file  Intimate Partner Violence:   . Fear of Current or Ex-Partner: Not on file  . Emotionally Abused: Not on file  . Physically Abused: Not on file  . Sexually Abused: Not on file  :  Review of Systems  Constitutional: Positive for malaise/fatigue.  HENT: Negative.   Eyes: Negative.   Respiratory: Negative.   Cardiovascular: Positive for chest pain.  Gastrointestinal: Positive for abdominal  pain.  Genitourinary: Negative.   Musculoskeletal: Positive for joint pain and myalgias.  Skin: Negative.   Neurological: Positive for focal weakness.  Endo/Heme/Allergies: Negative.   Psychiatric/Behavioral: Negative.      Exam:  Physical Exam Vitals reviewed.  HENT:     Head: Normocephalic and atraumatic.  Eyes:     Pupils: Pupils are equal, round, and reactive to light.  Cardiovascular:     Rate and Rhythm: Normal rate and regular rhythm.     Heart sounds: Normal heart sounds.  Pulmonary:     Effort: Pulmonary effort is normal.     Breath sounds: Normal breath sounds.  Abdominal:     General: Bowel sounds are normal.     Palpations: Abdomen is soft.  Musculoskeletal:        General: No tenderness or deformity. Normal range of motion.     Cervical back: Normal range of motion.  Lymphadenopathy:     Cervical: No cervical adenopathy.  Skin:    General: Skin is warm and dry.     Findings: No erythema or rash.  Neurological:     Mental Status: He is alert and oriented to person, place, and time.  Psychiatric:        Behavior: Behavior normal.        Thought Content: Thought content normal.        Judgment: Judgment normal.       Patient Vitals for the past 24 hrs:  BP Temp Temp src Pulse Resp SpO2 Height Weight  08/27/19 0443 114/80 - - 94 (!) 23 92 % - -  08/26/19 2115 (!) 136/96 - - 87 15 100 % - -  08/26/19 2015 132/90 - - 91 (!) 21 98 % - -  08/26/19 1945 132/81 - - 87 (!) 23 98 % - -  08/26/19 1930 113/83 - - 89 20 99 % - -  08/26/19 1915 123/72 - - 86 (!) 22 97 % - -  08/26/19 1900 115/81 - - 83 19 99 % - -  08/26/19 1845 - - -  77 17 98 % - -  08/26/19 1830 - - - 79 17 99 % - -  08/26/19 1815 - - - 79 16 95 % - -  08/26/19 1800 - - - - 19 - - -  08/26/19 1745 - - - 80 16 100 % - -  08/26/19 1730 - - - 82 (!) 22 100 % - -  08/26/19 1715 - - - 71 19 98 % - -  08/26/19 1700 - - - 74 17 98 % 6' (1.829 m) 220 lb 0.3 oz (99.8 kg)  08/26/19 1645 - - - 79 20  96 % - -  08/26/19 1630 - - - 78 20 97 % - -  08/26/19 1615 - - - 82 13 96 % - -  08/26/19 1500 113/70 - - 86 11 95 % - -  08/26/19 1445 115/76 - - 83 (!) 21 97 % - -  08/26/19 1415 - - - 87 18 100 % - -  08/26/19 1330 104/69 - - 85 17 98 % - -  08/26/19 1329 - (!) 100.4 F (38 C) Rectal - - - - -  08/26/19 1322 (!) 114/92 98.9 F (37.2 C) Oral 88 (!) 22 96 % - -     Recent Labs    08/26/19 1331 08/27/19 0028  WBC 6.3 6.0  HGB 10.2* 10.1*  HCT 32.9* 31.8*  PLT 339 321   Recent Labs    08/26/19 1331 08/27/19 0028  NA 137 139  K 4.8 4.0  CL 105 105  CO2 24 22  GLUCOSE 109* 107*  BUN 16 15  CREATININE 1.03 0.99  CALCIUM 8.3* 8.6*    Blood smear review: None  Pathology: None    Assessment and Plan: James Byrd is a 54 year old white male.  He has locally recurrent melanoma.  He had radiation therapy for this after surgery.  He currently is on nivolumab.  He actually is tolerating nivolumab okay.  Now he has bilateral pulmonary emboli.  I am sure this is from him just not being active.  I cannot imagine that he has a underlying hypercoagulable problem.  However, we will probably have to check this just to be thorough.  I would keep him on heparin for right now.  I think that he ultimately will need to go on to Lovenox.  I do think Lovenox will be more effective for him versus the DOACs.  His biggest problem clearly is this arthralgia.  Again this would be very unusual for immunotherapy although you can find reports that immunotherapy causes a rheumatoid arthritis type condition.  This is really what is causing his debilitation and is "driving" is quality of life.  I think would be worthwhile getting a bone scan on him to see if there is actual bone inflammation.  I would also get iron studies on him to see what his iron level is.  We checked him for GI bleeding and this is been negative.  Again, with pulmonary emboli, I just like 2 to 3 days of heparin as this can be  monitored and dosed accurately.  I would then consider him for Lovenox.  I do feel bad for James Byrd.  This is just another complication that he has had from his malignancy.  I must say that he certainly has been one of the most challenging patients that we have had.  He is very nice.  He is done everything we have asked him to do.  It is just that his poor body has a very "unique" constitution and he definitely has unusual toxicities from treatment.  I very much appreciate the great care that he is getting on 4 E.  Lattie Haw, MD  [place] [phone]

## 2019-08-27 NOTE — Progress Notes (Signed)
  Echocardiogram 2D Echocardiogram has been performed.  Geoffery Lyons Swaim 08/27/2019, 11:11 AM

## 2019-08-27 NOTE — Evaluation (Signed)
Physical Therapy Evaluation Patient Details Name: James Byrd MRN: [id number] DOB: 1966/05/13 Today's Date: 08/27/2019   History of Present Illness  Pt is a 54 yo male presenting with shortness of breath and hypotension, found to have bilateral PEs upon admission. Pt PMH includes: adrenal insufficiency, anxiety, arthritis, and malignant melanoma to the pancreas who is currently getting monthly chemotherapy.  Clinical Impression  Pt in bed upon arrival of PT, agreeable to evaluation at this time. Prior to admission the pt was mobilizing independently without use of AD, but was increasingly pain-limited at home and has recently been relying on wife for all ADLs and IADLs. The pt now presents with limitations in functional mobility, dynamic stability, and strength due to above dx and ongoing pain, and will continue to benefit from skilled PT to address these deficits. The pt currently prefers to not use AD due to pain in bilateral wrists/hands and will continue to benefit from dynamic and reactive balance training and mobility for pain management.      Follow Up Recommendations Home health PT;Supervision for mobility/OOB    Equipment Recommendations  None recommended by PT(pt has RW but prefers not to use AD)    Recommendations for Other Services       Precautions / Restrictions Precautions Precautions: Fall Restrictions Weight Bearing Restrictions: No      Mobility  Bed Mobility Overal bed mobility: Needs Assistance Bed Mobility: Supine to Sit;Sit to Supine     Supine to sit: Min guard;HOB elevated Sit to supine: Supervision   General bed mobility comments: pt requires sig extra time and VCs for coming to sit EOB, able to return to supine with supervision only and no assist needed  Transfers Overall transfer level: Needs assistance Equipment used: 1 person hand held assist Transfers: Sit to/from Stand Sit to Stand: Min assist         General transfer comment: minA  through HHA to rise to standing from EOB, no LOB or instability noted with initial stand  Ambulation/Gait Ambulation/Gait assistance: Min guard Gait Distance (Feet): 30 Feet Assistive device: None Gait Pattern/deviations: Step-through pattern;Decreased stride length;Shuffle;Wide base of support Gait velocity: decreased Gait velocity interpretation: <1.31 ft/sec, indicative of household ambulator General Gait Details: pt ambulates with sig slowed ambulation with minimal shuffle and wide BOS, pt with increased lateral movement due to lack of functional ROM at knees or ankles, but no LOB  Stairs            Wheelchair Mobility    Modified Rankin (Stroke Patients Only)       Balance Overall balance assessment: Needs assistance Sitting-balance support: No upper extremity supported;Feet supported Sitting balance-Leahy Scale: Good Sitting balance - Comments: mod I   Standing balance support: No upper extremity supported;During functional activity Standing balance-Leahy Scale: Fair Standing balance comment: pt able to ambulate without UE support, no LOB but poor reactive balance                             Pertinent Vitals/Pain Pain Assessment: Faces Faces Pain Scale: Hurts whole lot Pain Location: generalized at all joints, worst: bilateral wrists, ankles, hips Pain Descriptors / Indicators: Aching;Constant;Grimacing;Sore Pain Intervention(s): Limited activity within patient's tolerance;Monitored during session;Repositioned    Home Living Family/patient expects to be discharged to:: Private residence Living Arrangements: Spouse/significant other Available Help at Discharge: Family;Available 24 hours/day Type of Home: House Home Access: Stairs to enter Entrance Stairs-Rails: Can reach both Entrance Stairs-Number of Steps: 2  Home Layout: One level Home Equipment: Walker - 2 wheels;Bedside commode;Hand held shower head      Prior Function Level of Independence:  Needs assistance   Gait / Transfers Assistance Needed: pt able to walk short distances in home, mostly pain limited, does not use AD  ADL's / Homemaking Assistance Needed: wife completes most ADLs and home-care. Pt was helping and caring for grandchildren prior to exacerbation of joint pain  Comments: pt not currently working, reports he mostly sits in recliner at home, does still sleep in bed, but is progressively more pain limited     Hand Dominance   Dominant Hand: Right    Extremity/Trunk Assessment   Upper Extremity Assessment Upper Extremity Assessment: Generalized weakness;Defer to OT evaluation    Lower Extremity Assessment Lower Extremity Assessment: Generalized weakness(pt with poor functional ROM across most LE joints due to pain)    Cervical / Trunk Assessment Cervical / Trunk Assessment: Kyphotic  Communication   Communication: No difficulties  Cognition Arousal/Alertness: Awake/alert Behavior During Therapy: WFL for tasks assessed/performed;Flat affect Overall Cognitive Status: Within Functional Limits for tasks assessed                                 General Comments: pt with flat affect, but agreeable and cooperative to PT session      General Comments      Exercises     Assessment/Plan    PT Assessment Patient needs continued PT services  PT Problem List Decreased strength;Decreased mobility;Decreased range of motion;Decreased coordination;Decreased activity tolerance;Decreased balance;Pain       PT Treatment Interventions Gait training;DME instruction;Therapeutic exercise;Balance training;Stair training;Functional mobility training;Therapeutic activities;Patient/family education    PT Goals (Current goals can be found in the Care Plan section)  Acute Rehab PT Goals Patient Stated Goal: be able to play with grandchildren PT Goal Formulation: With patient Time For Goal Achievement: 09/10/19 Potential to Achieve Goals: Fair     Frequency Min 3X/week   Barriers to discharge        Co-evaluation               AM-PAC PT "6 Clicks" Mobility  Outcome Measure Help needed turning from your back to your side while in a flat bed without using bedrails?: A Little Help needed moving from lying on your back to sitting on the side of a flat bed without using bedrails?: A Little Help needed moving to and from a bed to a chair (including a wheelchair)?: A Little Help needed standing up from a chair using your arms (e.g., wheelchair or bedside chair)?: A Little Help needed to walk in hospital room?: A Little Help needed climbing 3-5 steps with a railing? : A Lot 6 Click Score: 17    End of Session Equipment Utilized During Treatment: Gait belt Activity Tolerance: Patient tolerated treatment well Patient left: in bed;with call bell/phone within reach;with nursing/sitter in room Nurse Communication: Mobility status PT Visit Diagnosis: Other abnormalities of gait and mobility (R26.89);Difficulty in walking, not elsewhere classified (R26.2);Muscle weakness (generalized) (M62.81);Pain Pain - Right/Left: (bilatera) Pain - part of body: Hand;Hip;Ankle and joints of foot;Knee    Time: 0938-1829 PT Time Calculation (min) (ACUTE ONLY): 28 min   Charges:   PT Evaluation $PT Eval Low Complexity: 1 Low PT Treatments $Gait Training: 8-22 mins        Karma Ganja, PT, DPT   Acute Rehabilitation Department Pager #: [phone]  Otho Bellows 08/27/2019, 10:09 AM

## 2019-08-27 NOTE — Progress Notes (Signed)
ANTICOAGULATION CONSULT NOTE  Pharmacy Consult for Heparin Indication: pulmonary embolus  No Known Allergies  Patient Measurements: Height: 6' (182.9 cm) Weight: 220 lb 0.3 oz (99.8 kg) IBW/kg (Calculated) : 77.6 Heparin Dosing Weight: 97.8 kg  Vital Signs: BP: 114/80 (03/09 0443) Pulse Rate: 94 (03/09 0443)  Labs: Recent Labs    08/26/19 1331 08/26/19 1624 08/27/19 0028 08/27/19 0818  HGB 10.2*  --  10.1*  --   HCT 32.9*  --  31.8*  --   PLT 339  --  321  --   APTT 36  --   --   --   LABPROT 16.4*  --   --   --   INR 1.3*  --   --   --   HEPARINUNFRC  --   --  0.10* 0.18*  CREATININE 1.03  --  0.99  --   TROPONINIHS 4 3  --   --     Estimated Creatinine Clearance: 105.6 mL/min (by C-G formula based on SCr of 0.99 mg/dL).   Medical History: Past Medical History:  Diagnosis Date  . Adrenal insufficiency ([place])   . Anxiety 05/24/2012  . Complication of anesthesia    SLO TO AWAKEN MANY 8 TO 9 YRS AGO, NO ISSUES SINCE  . Goals of care, counseling/discussion 12/17/2018  . Gout   . History of kidney stones   . HLD (hyperlipidemia)   . Malignant melanoma of right great toe ([place]) 11/14/2014  . Neuroendocrine carcinoma of pancreas ([place]) 05/22/2019  . Paroxysmal supraventricular tachycardia ([place]) 05/24/2012   Described in the treadmill report; details are pending   . Sleep apnea    MILD NO CPAP NEEDED    Medications:  Scheduled:  . vitamin C  1,000 mg Oral Daily  . cholecalciferol  2,000 Units Oral Daily  . gabapentin  600 mg Oral BID  . hydrocortisone  5 mg Oral BID  . mirabegron ER  50 mg Oral QHS  . [START ON 08/28/2019] potassium chloride  20 mEq Oral QODAY  . pyridOXINE  100 mg Oral Daily  . cyanocobalamin  2,000 mcg Oral Daily    Assessment: 67 yoM with recurrent melanoma admitted with SOB and found to have acute bilateral PE without RHS. Pt currently on IV heparin.  Heparin level remains subtherapeutic but trending up to 0.18, CBC stable, no S/Sx bleeding  per RN.  Goal of Therapy:  Heparin level 0.3-0.7 units/ml Monitor platelets by anticoagulation protocol: Yes   Plan:  -Heparin 2500 x1 then increase to 2350 units/h -Recheck heparin level in [place], PharmD, BCPS Clinical Pharmacist [phone] Please check AMION for all [place] numbers 08/27/2019

## 2019-08-27 NOTE — Progress Notes (Signed)
Bilateral lower extremity venous duplex complete.  Please see CV proc tab for preliminary results. Lita Mains- RDMS, RVT 12:32 PM  08/27/2019

## 2019-08-27 NOTE — Progress Notes (Signed)
[place] for Heparin Indication: pulmonary embolus  Assessment: Patient is a 71 yom that presents to the ED with some chortness of breath and hypotension. The patient was found to have bilateral PEs on evaluation. Pharmacy has been asked to dose heparin in this patient of PE.  Initial heparin level 0.1 units/ml  Goal of Therapy:  Heparin level 0.3-0.7 units/ml Monitor platelets by anticoagulation protocol: Yes   Plan:  - Heparin 3000 units IV x 1 dose  - Increase Heparin drip @ 2050 units/hr  - Heparin level in ~6 hours  - Monitor patient for s/s of bleeding and CBC while on heparin  Thanks for allowing pharmacy to be a part of this patient's care.  Excell Seltzer, PharmD Clinical Pharmacist 08/27/2019,1:30 AM

## 2019-08-27 NOTE — Evaluation (Signed)
Occupational Therapy Evaluation Patient Details Name: James Byrd MRN: [id number] DOB: Mar 21, 1966 Today's Date: 08/27/2019    History of Present Illness Pt is a 54 yo male presenting with shortness of breath and hypotension, found to have bilateral PEs upon admission. Pt PMH includes: adrenal insufficiency, anxiety, arthritis, and malignant melanoma to the pancreas who is currently getting monthly chemotherapy.   Clinical Impression   Pt admitted with the above diagnoses and presents with below problem list. Pt will benefit from continued acute OT to address the below listed deficits and maximize independence with basic ADLs prior to d/c home with spouse. PTA pt was needing assist with ADLs. Pt limited by pain and fatigue this session, emesis once sitting EOB due to "gag reflex," and arrival of vascular lab to assess for LE DVTs. Provided handout on energy conservation strategies. Spouse present and involved throughout session.     Follow Up Recommendations  Home health OT;Supervision/Assistance - 24 hour    Equipment Recommendations  Other (comment)(TBD - may benefit from tub bench, need to assess further)    Recommendations for Other Services       Precautions / Restrictions Precautions Precautions: Fall Restrictions Weight Bearing Restrictions: No      Mobility Bed Mobility Overal bed mobility: Needs Assistance Bed Mobility: Supine to Sit;Sit to Supine     Supine to sit: Min assist;HOB elevated Sit to supine: Min guard   General bed mobility comments: min A to powerup trunk to full EOB position. min guard for safety to return to supine.   Transfers                 General transfer comment: unable to attempt this session. Pt with "gag reflex" sitting EOB and with episode of emesis and vascular lab arrived for DVT testing    Balance Overall balance assessment: Needs assistance Sitting-balance support: No upper extremity supported;Feet supported Sitting  balance-Leahy Scale: Good Sitting balance - Comments: mod I                                   ADL either performed or assessed with clinical judgement   ADL Overall ADL's : Needs assistance/impaired Eating/Feeding: Moderate assistance   Grooming: Moderate assistance;Maximal assistance   Upper Body Bathing: Maximal assistance   Lower Body Bathing: Maximal assistance   Upper Body Dressing : Maximal assistance   Lower Body Dressing: Maximal assistance   Toilet Transfer: Stand-pivot;Minimal assistance   Toileting- Clothing Manipulation and Hygiene: Maximal assistance         General ADL Comments: Pt completed bed mobility, sat EOB a few minutes. Session limited by arrival of vascular lab to assess for LE DVT. Pt returned to supine. Wife present and reports she helps him at home. Provided energy conservation handout. Pt with signifianct BUE weakness and ROM limitiations due to arthritis     Vision Patient Visual Report: No change from baseline       Perception     Praxis      Pertinent Vitals/Pain Pain Assessment: Faces Faces Pain Scale: Hurts whole lot Pain Location: generalized at all joints, worst: bilateral wrists, ankles, hips Pain Descriptors / Indicators: Aching;Constant;Grimacing;Sore Pain Intervention(s): Limited activity within patient's tolerance;Monitored during session;Repositioned     Hand Dominance Right   Extremity/Trunk Assessment Upper Extremity Assessment Upper Extremity Assessment: Generalized weakness;RUE deficits/detail;LUE deficits/detail RUE Deficits / Details: Pt reports RUE a bit stronger than LUEarthitis. very weak 3/5 elbow  and hand, 2+/5 wrist. 2/5 shoulder level LUE Deficits / Details: arthitis. very weak 3/5 elbow and hand, 2+/5 wrist. 2/5 shoulder leve   Lower Extremity Assessment Lower Extremity Assessment: Defer to PT evaluation   Cervical / Trunk Assessment Cervical / Trunk Assessment: Kyphotic   Communication  Communication Communication: No difficulties   Cognition Arousal/Alertness: Awake/alert Behavior During Therapy: WFL for tasks assessed/performed;Flat affect Overall Cognitive Status: Within Functional Limits for tasks assessed                                 General Comments: pt with flat affect, but agreeable and cooperative    General Comments       Exercises     Shoulder Instructions      Home Living Family/patient expects to be discharged to:: Private residence Living Arrangements: Spouse/significant other Available Help at Discharge: Family;Available 24 hours/day Type of Home: House Home Access: Stairs to enter [employer] of Steps: 2 Entrance Stairs-Rails: Can reach both Home Layout: One level     Bathroom Shower/Tub: Teacher, early years/pre: Standard     Home Equipment: Environmental consultant - 2 wheels;Bedside commode;Hand held shower head          Prior Functioning/Environment Level of Independence: Needs assistance  Gait / Transfers Assistance Needed: pt able to walk short distances in home, mostly pain limited, does not use AD ADL's / Homemaking Assistance Needed: wife completes most ADLs and home-care. Pt was helping and caring for grandchildren prior to exacerbation of joint pain Communication / Swallowing Assistance Needed: no difficulties Comments: pt not currently working, reports he mostly sits in recliner at home, does still sleep in bed, but is progressively more pain limited        OT Problem List: Decreased strength;Decreased range of motion;Decreased activity tolerance;Impaired balance (sitting and/or standing);Decreased knowledge of use of DME or AE;Decreased knowledge of precautions;Impaired UE functional use;Pain;Increased edema      OT Treatment/Interventions: Self-care/ADL training;Therapeutic exercise;Energy conservation;DME and/or AE instruction;Therapeutic activities;Patient/family education;Balance training    OT  Goals(Current goals can be found in the care plan section) Acute Rehab OT Goals Patient Stated Goal: be able to play with grandchildren OT Goal Formulation: With patient/family Time For Goal Achievement: 09/10/19 Potential to Achieve Goals: Fair ADL Goals Pt Will Perform Eating: with modified independence;sitting;bed level Pt Will Perform Upper Body Bathing: with mod assist;sitting Pt Will Perform Lower Body Bathing: with mod assist;sit to/from stand;sitting/lateral leans Pt Will Transfer to Toilet: with min guard assist;ambulating Pt Will Perform Toileting - Clothing Manipulation and hygiene: with mod assist;sit to/from stand;sitting/lateral leans Additional ADL Goal #1: Pt will complete bed mobility at supervision level to prepare for EOB/OOB ADLs.  OT Frequency: Min 2X/week   Barriers to D/C:            Co-evaluation              AM-PAC OT "6 Clicks" Daily Activity     Outcome Measure Help from another person eating meals?: A Lot Help from another person taking care of personal grooming?: A Lot Help from another person toileting, which includes using toliet, bedpan, or urinal?: A Lot Help from another person bathing (including washing, rinsing, drying)?: A Lot Help from another person to put on and taking off regular upper body clothing?: A Lot Help from another person to put on and taking off regular lower body clothing?: A Lot 6 Click Score: 12  End of Session    Activity Tolerance: Patient limited by fatigue;Patient limited by pain;Patient limited by lethargy Patient left: in bed;with call bell/phone within reach;with family/visitor present;Other (comment)(vascular)  OT Visit Diagnosis: Unsteadiness on feet (R26.81);Muscle weakness (generalized) (M62.81);Other abnormalities of gait and mobility (R26.89);Pain                Time: 9407-6808 OT Time Calculation (min): 22 min Charges:  OT General Charges $OT Visit: 1 Visit OT Evaluation $OT Eval Low Complexity: [place], OT Acute Rehabilitation Services Pager: [phone] Office: [phone]   Hortencia Pilar 08/27/2019, 2:17 PM

## 2019-08-27 NOTE — Progress Notes (Signed)
PROGRESS NOTE    James Byrd  DJT:[id number] DOB: 10-08-1965 DOA: 08/26/2019 PCP: Venetia Maxon, Sharon Mt, MD  Brief Narrative: Weber Monnier is a 54 y.o. male with medical history of stage IIIb malignant melanoma with nodal metastasis, history of neuroendocrine carcinoma of pancreas, treated with immunotherapy and subsequently debilitated by severe ongoing arthralgias and pain , presented to the emergency room with hypotension yesterday. -On further work-up was noted to have bilateral pulmonary embolism with lung infarction   Assessment & Plan:   Acute bilateral pulmonary embolism with lung infarction -Admitted with hypotension, blood pressure improving on IV fluids, stopped his fluids this morning, blood pressure dropped subsequently will resume fluids today -Continue IV heparin -Check 2D echocardiogram -No evidence of pneumonia, CT findings consistent with infarction, procalcitonin is low -Given severe arthralgias, would be very difficult for patient to use subcu Lovenox, may need to consider oral anticoagulation under the circumstances   Hypotension History of adrenal insufficiency This could be contributing to his mild hypotension, stop oral hydrocortisone and will add stress dose steroids today  Stage IIIb malignant melanoma -On immunotherapy  Diffuse arthralgias -Ongoing for 2 months, extremely debilitating -Secondary to immunotherapy, possibly drug-induced RA, or other inflammatory arthralgias -Check ANA, ESR, dsDNA, RF  DVT prophylaxis: Heparin GTT Code Status: Full code Family Communication:  No family at bedside, plan discussed the patient who agrees Disposition Plan:  He is medically not stable for discharge home today given hypotension and acute PE   Consultants:   Oncology   Procedures:   Antimicrobials:    Subjective: -Remains debilitated by diffuse arthralgia -Denies any chest pain this morning, mild dyspnea reported  Objective: Vitals:   08/26/19  2015 08/26/19 2115 08/27/19 0443 08/27/19 0952  BP: 132/90 (!) 136/96 114/80 (!) 80/57  Pulse: 91 87 94 90  Resp: (!) 21 15 (!) 23   Temp:      TempSrc:      SpO2: 98% 100% 92% 96%  Weight:      Height:        Intake/Output Summary (Last 24 hours) at 08/27/2019 1058 Last data filed at 08/27/2019 3009 Gross per 24 hour  Intake 150 ml  Output 1000 ml  Net -850 ml   Filed Weights   08/26/19 1700  Weight: 99.8 kg    Examination:  General exam: Pleasant male uncomfortable appearing lying in bed, AAOx3 Respiratory system: Decreased breath sounds at both bases Cardiovascular system: S1 & S2 heard, RRR.  Gastrointestinal system: Abdomen is nondistended, soft and nontender.Normal bowel sounds heard. Extremities: Trace edema Skin: No rashes, lesions or ulcers Musculoskeletal, mild synovitis involving wrists, elbows, shoulders and knees Psychiatry: Judgement and insight appear normal. Mood & affect appropriate.     Data Reviewed:   CBC: Recent Labs  Lab 08/26/19 1331 08/27/19 0028  WBC 6.3 6.0  NEUTROABS 4.9 4.4  HGB 10.2* 10.1*  HCT 32.9* 31.8*  MCV 82.0 80.1  PLT 339 233   Basic Metabolic Panel: Recent Labs  Lab 08/26/19 1331 08/26/19 1711 08/27/19 0028  NA 137  --  139  K 4.8  --  4.0  CL 105  --  105  CO2 24  --  22  GLUCOSE 109*  --  107*  BUN 16  --  15  CREATININE 1.03  --  0.99  CALCIUM 8.3*  --  8.6*  MG  --  1.8  --    GFR: Estimated Creatinine Clearance: 105.6 mL/min (by C-G formula based on SCr of 0.99 mg/dL). Liver  Function Tests: Recent Labs  Lab 08/26/19 1331 08/27/19 0028  AST 22 13*  ALT 17 15  ALKPHOS 64 62  BILITOT 0.9 0.5  PROT 6.2* 6.1*  ALBUMIN 3.0* 3.1*   No results for input(s): LIPASE, AMYLASE in the last 168 hours. No results for input(s): AMMONIA in the last 168 hours. Coagulation Profile: Recent Labs  Lab 08/26/19 1331  INR 1.3*   Cardiac Enzymes: No results for input(s): CKTOTAL, CKMB, CKMBINDEX, TROPONINI in the  last 168 hours. BNP (last 3 results) No results for input(s): PROBNP in the last 8760 hours. HbA1C: No results for input(s): HGBA1C in the last 72 hours. CBG: No results for input(s): GLUCAP in the last 168 hours. Lipid Profile: No results for input(s): CHOL, HDL, LDLCALC, TRIG, CHOLHDL, LDLDIRECT in the last 72 hours. Thyroid Function Tests: Recent Labs    08/26/19 1712  TSH 0.806   Anemia Panel: Recent Labs    08/27/19 0818  FERRITIN 745*  TIBC 178*  IRON 22*   Urine analysis:    Component Value Date/Time   COLORURINE YELLOW 08/26/2019 1344   APPEARANCEUR CLEAR 08/26/2019 1344   LABSPEC 1.018 08/26/2019 1344   PHURINE 5.0 08/26/2019 1344   GLUCOSEU NEGATIVE 08/26/2019 1344   HGBUR NEGATIVE 08/26/2019 1344   BILIRUBINUR NEGATIVE 08/26/2019 1344   KETONESUR NEGATIVE 08/26/2019 1344   PROTEINUR NEGATIVE 08/26/2019 1344   UROBILINOGEN 0.2 03/06/2015 0928   NITRITE NEGATIVE 08/26/2019 1344   LEUKOCYTESUR NEGATIVE 08/26/2019 1344   Sepsis Labs: _0 (procalcitonin:4,lacticidven:4)  ) Recent Results (from the past 240 hour(s))  Urine culture     Status: None   Collection Time: 08/26/19  1:49 PM   Specimen: Urine, Clean Catch  Result Value Ref Range Status   Specimen Description URINE, CLEAN CATCH  Final   Special Requests NONE  Final   Culture   Final    NO GROWTH Performed at [facility], [place] [address], [place], [place] [address]    Report Status 08/27/2019 FINAL  Final  Respiratory Panel by PCR     Status: None   Collection Time: 08/26/19  8:30 PM   Specimen: Flu Kit Nasopharyngeal Swab; Respiratory  Result Value Ref Range Status   Adenovirus NOT DETECTED NOT DETECTED Final   Coronavirus 229E NOT DETECTED NOT DETECTED Final    Comment: (NOTE) The Coronavirus on the Respiratory Panel, DOES NOT test for the novel  Coronavirus (2019 nCoV)    Coronavirus HKU1 NOT DETECTED NOT DETECTED Final   Coronavirus NL63 NOT DETECTED NOT DETECTED Final    Coronavirus OC43 NOT DETECTED NOT DETECTED Final   Metapneumovirus NOT DETECTED NOT DETECTED Final   Rhinovirus / Enterovirus NOT DETECTED NOT DETECTED Final   Influenza A NOT DETECTED NOT DETECTED Final   Influenza B NOT DETECTED NOT DETECTED Final   Parainfluenza Virus 1 NOT DETECTED NOT DETECTED Final   Parainfluenza Virus 2 NOT DETECTED NOT DETECTED Final   Parainfluenza Virus 3 NOT DETECTED NOT DETECTED Final   Parainfluenza Virus 4 NOT DETECTED NOT DETECTED Final   Respiratory Syncytial Virus NOT DETECTED NOT DETECTED Final   Bordetella pertussis NOT DETECTED NOT DETECTED Final   Chlamydophila pneumoniae NOT DETECTED NOT DETECTED Final   Mycoplasma pneumoniae NOT DETECTED NOT DETECTED Final    Comment: Performed at [facility], [place]. [address] [place], [place] [address]         Radiology Studies: CT Angio Chest PE W/Cm &/Or Wo Cm  Result Date: 08/26/2019 CLINICAL DATA:  Dyspnea.  Possible pulmonary embolism. EXAM: CT ANGIOGRAPHY CHEST WITH CONTRAST TECHNIQUE: Multidetector CT imaging of the chest was performed using the standard protocol during bolus administration of intravenous contrast. Multiplanar CT image reconstructions and MIPs were obtained to evaluate the vascular anatomy. Automatic exposure control utilized. CONTRAST:  92m OMNIPAQUE IOHEXOL 350 MG/ML SOLN COMPARISON:  January 24, 2019 FINDINGS: Cardiovascular: There are peripheral pulmonary emboli bilaterally with Hampton's humps in the lung bases and lingula. The pulmonary trunk, right pulmonary artery, left pulmonary artery, and bilateral lobar pulmonary arteries are patent without contrast filling defects. There is no right heart strain. Paucity of coronary calcification. Relatively small caliber of the right main coronary artery from the ostia. Mild calcified atherosclerosis of the thoracic aorta arch. A 3.5 cm caliber of the tubular aorta. No thoracic aortic dissection. Mediastinum/Nodes: A reactive-appearing  borderline enlarged 12 mm short axis diameter right hilar noncalcified lymph node. No mediastinal adenopathy. Diminutive thyroid. Lungs/Pleura: No pulmonary edema. Patchy consolidation in both lungs, concerning for ischemia. Two 2 cm thick-walled cavitary lesions in the right base, probably ischemic. No emphysema. Upper Abdomen: A 3.5 cm left hemiliver and 2.0 cm right hemiliver simple-appearing cortical cysts, similarly sized compared to August 2020 and slightly increased in size is compared to the March 14, 2015 CT abdomen study, likely benign hepatic cysts. The bilateral adrenal glands are incompletely imaged. Musculoskeletal: Mild to moderate degenerative changes most pronounced at the lower cervical levels, and a T12 benign bone island. Review of the MIP images confirms the above findings. Critical Value/emergent results were called by telephone at the time of interpretation on 08/26/2019 at 4:21 pm [facility] these results. IMPRESSION: Bilateral peripheral pulmonary emboli without right heart strain. Two 2 cm thick-walled cystic lesions in the right base, probably ischemic. Bilateral Hampton's humps. Unenhanced CT chest recommended in 3 months. A reactive-appearing borderline enlarged right hilar noncalcified lymph node. Paucity of coronary calcification. Relatively small caliber of the right main coronary artery from the ostia. Thoracic aortic calcified atherosclerosis. Electronically Signed   By: RRevonda Humphrey  On: 08/26/2019 16:22   DG Chest Port 1 View  Result Date: 08/26/2019 CLINICAL DATA:  Hypotension EXAM: PORTABLE CHEST 1 VIEW COMPARISON:  January 15, 2019. FINDINGS: There is ill-defined airspace opacity in the right base region. There is mild left base atelectasis. Heart size and pulmonary vascularity are within normal limits. No adenopathy. No pneumothorax. No bone lesions. IMPRESSION: Ill-defined airspace opacity in the right base which may represent  a degree of pneumonia. No associated consolidation. Atypical organism pneumonia could present in this manner. Mild left base atelectasis. Cardiac silhouette within normal limits.  No evident adenopathy. Electronically Signed   By: WLowella GripIII M.D.   On: 08/26/2019 14:07        Scheduled Meds: . vitamin C  1,000 mg Oral Daily  . cholecalciferol  2,000 Units Oral Daily  . gabapentin  600 mg Oral BID  . heparin  2,500 Units Intravenous Once  . hydrocortisone  5 mg Oral BID  . mirabegron ER  50 mg Oral QHS  . [START ON 08/28/2019] potassium chloride  20 mEq Oral QODAY  . pyridOXINE  100 mg Oral Daily  . cyanocobalamin  2,000 mcg Oral Daily   Continuous Infusions: . heparin 2,050 Units/hr (08/27/19 0132)     LOS: 0 days    Time spent: 35MIN    PDomenic Polite MD [facility]   08/27/2019, 10:58 AM

## 2019-08-27 NOTE — Progress Notes (Signed)
ANTICOAGULATION CONSULT NOTE  Pharmacy Consult for Heparin Indication: pulmonary embolus  No Known Allergies  Patient Measurements: Height: 6' (182.9 cm) Weight: 220 lb 0.3 oz (99.8 kg) IBW/kg (Calculated) : 77.6 Heparin Dosing Weight: 97.8 kg  Vital Signs: Temp: 97.9 F (36.6 C) (03/09 0953) Temp Source: Oral (03/09 0953) BP: 118/80 (03/09 0953) Pulse Rate: 89 (03/09 0953)  Labs: Recent Labs    08/26/19 1331 08/26/19 1624 08/27/19 0028 08/27/19 0818 08/27/19 1620  HGB 10.2*  --  10.1*  --   --   HCT 32.9*  --  31.8*  --   --   PLT 339  --  321  --   --   APTT 36  --   --   --   --   LABPROT 16.4*  --   --   --   --   INR 1.3*  --   --   --   --   HEPARINUNFRC  --   --  0.10* 0.18* 0.24*  CREATININE 1.03  --  0.99  --   --   TROPONINIHS 4 3  --   --   --     Estimated Creatinine Clearance: 105.6 mL/min (by C-G formula based on SCr of 0.99 mg/dL).   Medical History: Past Medical History:  Diagnosis Date  . Adrenal insufficiency ([place])   . Anxiety 05/24/2012  . Complication of anesthesia    SLO TO AWAKEN MANY 8 TO 9 YRS AGO, NO ISSUES SINCE  . Goals of care, counseling/discussion 12/17/2018  . Gout   . History of kidney stones   . HLD (hyperlipidemia)   . Malignant melanoma of right great toe ([place]) 11/14/2014  . Neuroendocrine carcinoma of pancreas ([place]) 05/22/2019  . Paroxysmal supraventricular tachycardia ([place]) 05/24/2012   Described in the treadmill report; details are pending   . Sleep apnea    MILD NO CPAP NEEDED    Medications:  Scheduled:  . vitamin C  1,000 mg Oral Daily  . cholecalciferol  2,000 Units Oral Daily  . gabapentin  600 mg Oral BID  . hydrocortisone sod succinate (SOLU-CORTEF) inj  50 mg Intravenous Q8H  . mirabegron ER  50 mg Oral QHS  . [START ON 08/28/2019] potassium chloride  20 mEq Oral QODAY  . pyridOXINE  100 mg Oral Daily  . cyanocobalamin  2,000 mcg Oral Daily    Assessment: 77 yoM with recurrent melanoma admitted with SOB  and found to have acute bilateral PE without RHS. Pt currently on IV heparin.  Heparin level remains subtherapeutic but trending up, CBC stable, no S/Sx bleeding per RN.   No known issues with IV infusion.  Goal of Therapy:  Heparin level 0.3-0.7 units/ml Monitor platelets by anticoagulation protocol: Yes   Plan:  -Increase IV heparin to 2550 units/hr. -Recheck heparin level in 6hr -Daily heparin level and CBC.   Marguerite Olea, [facility] Clinical Pharmacist Phone [phone]  08/27/2019 6:03 PM

## 2019-08-28 ENCOUNTER — Encounter (HOSPITAL_COMMUNITY): Payer: Self-pay | Admitting: Family Medicine

## 2019-08-28 LAB — BASIC METABOLIC PANEL
Anion gap: 11 (ref 5–15)
BUN: 13 mg/dL (ref 6–20)
CO2: 22 mmol/L (ref 22–32)
Calcium: 8.9 mg/dL (ref 8.9–10.3)
Chloride: 106 mmol/L (ref 98–111)
Creatinine, Ser: 0.96 mg/dL (ref 0.61–1.24)
GFR calc Af Amer: >60 mL/min (ref >60)
GFR calc non Af Amer: >60 mL/min (ref >60)
Glucose, Bld: 144 mg/dL — ABNORMAL HIGH (ref 70–99)
Potassium: 3.9 mmol/L (ref 3.5–5.1)
Sodium: 139 mmol/L (ref 135–145)

## 2019-08-28 LAB — CBC
HCT: 32.3 % — ABNORMAL LOW (ref 39.0–52.0)
Hemoglobin: 10.2 g/dL — ABNORMAL LOW (ref 13.0–17.0)
MCH: 25.1 pg — ABNORMAL LOW (ref 26.0–34.0)
MCHC: 31.6 g/dL (ref 30.0–36.0)
MCV: 79.6 fL — ABNORMAL LOW (ref 80.0–100.0)
Platelets: 359 10*3/uL (ref 150–400)
RBC: 4.06 MIL/uL — ABNORMAL LOW (ref 4.22–5.81)
RDW: 17 % — ABNORMAL HIGH (ref 11.5–15.5)
WBC: 6 10*3/uL (ref 4.0–10.5)
nRBC: 0 % (ref 0.0–0.2)

## 2019-08-28 LAB — ANTI-DNA ANTIBODY, DOUBLE-STRANDED: ds DNA Ab: <1 IU/mL (ref 0–9)

## 2019-08-28 LAB — HEPARIN LEVEL (UNFRACTIONATED)
Heparin Unfractionated: 0.21 IU/mL — ABNORMAL LOW (ref 0.30–0.70)
Heparin Unfractionated: 0.25 IU/mL — ABNORMAL LOW (ref 0.30–0.70)
Heparin Unfractionated: 0.44 IU/mL (ref 0.30–0.70)

## 2019-08-28 LAB — BETA-2-GLYCOPROTEIN I ABS, IGG/M/A
Beta-2 Glyco I IgG: <9 GPI IgG units (ref 0–20)
Beta-2-Glycoprotein I IgA: <9 GPI IgA units (ref 0–25)
Beta-2-Glycoprotein I IgM: <9 GPI IgM units (ref 0–32)

## 2019-08-28 LAB — HOMOCYSTEINE: Homocysteine: 10.6 umol/L (ref 0.0–14.5)

## 2019-08-28 LAB — PROTEIN C ACTIVITY: Protein C Activity: 100 % (ref 73–180)

## 2019-08-28 LAB — PROTEIN S ACTIVITY: Protein S Activity: 103 % (ref 63–140)

## 2019-08-28 LAB — CARDIOLIPIN ANTIBODIES, IGG, IGM, IGA
Anticardiolipin IgA: <9 APL U/mL (ref 0–11)
Anticardiolipin IgG: 10 GPL U/mL (ref 0–14)
Anticardiolipin IgM: 9 MPL U/mL (ref 0–12)

## 2019-08-28 LAB — PROTEIN S, TOTAL: Protein S Ag, Total: 106 % (ref 60–150)

## 2019-08-28 LAB — ANA W/REFLEX IF POSITIVE: Anti Nuclear Antibody (ANA): NEGATIVE

## 2019-08-28 MED ORDER — HYDROCORTISONE 5 MG PO TABS
5.0000 mg | ORAL_TABLET | Freq: Two times a day (BID) | ORAL | Status: DC
  Administered 2019-08-28 – 2019-08-29 (×3): 5 mg via ORAL
  Filled 2019-08-28 (×3): qty 1

## 2019-08-28 MED ORDER — SODIUM CHLORIDE 0.9 % IV SOLN
25.0000 mg | Freq: Once | INTRAVENOUS | Status: AC
  Administered 2019-08-28: 25 mg via INTRAVENOUS
  Filled 2019-08-28: qty 0.5

## 2019-08-28 MED ORDER — METHYLPREDNISOLONE SODIUM SUCC 125 MG IJ SOLR
80.0000 mg | Freq: Once | INTRAMUSCULAR | Status: AC
  Administered 2019-08-28: 80 mg via INTRAVENOUS
  Filled 2019-08-28: qty 2

## 2019-08-28 MED ORDER — SODIUM CHLORIDE 0.9 % IV SOLN
1000.0000 mg | Freq: Once | INTRAVENOUS | Status: AC
  Administered 2019-08-28: 1000 mg via INTRAVENOUS
  Filled 2019-08-28: qty 20

## 2019-08-28 MED ORDER — SODIUM CHLORIDE 0.9 % IV SOLN: 1000.0000 mg | Freq: Once | INTRAVENOUS | Status: DC

## 2019-08-28 MED ORDER — SODIUM CHLORIDE 0.9 % IV SOLN
40.0000 mg | Freq: Once | INTRAVENOUS | Status: AC
  Administered 2019-08-28: 40 mg via INTRAVENOUS
  Filled 2019-08-28: qty 4

## 2019-08-28 NOTE — Progress Notes (Signed)
PROGRESS NOTE    James Byrd  PJA:[id number] DOB: 1966-03-17 DOA: 08/26/2019 PCP: Venetia Maxon, Sharon Mt, MD     Brief Narrative:  James Byrd is a 54 y.o.malewith medical history of stage IIIb malignant melanoma with nodal metastasis, history of neuroendocrine carcinoma of pancreas, treated with immunotherapy and subsequently debilitated by severe ongoing arthralgias and pain, presented to the emergency room with hypotension yesterday. On further work-up was noted to have bilateral pulmonary embolism with lung infarction and patient was started on IV heparin.  New events last 24 hours / Subjective: Doing well overall.  Denies any shortness of breath or chest pain.  Assessment & Plan:   Active Problems:   Adrenal insufficiency (HCC)   Bilateral pulmonary embolism (HCC)    Acute bilateral pulmonary embolism with lung infarction -Admitted with hypotension, blood pressure improved  -Continue IV heparin --> Lovenox on discharge.  Patient willing to undergo Lovenox teaching and discharged on Lovenox as recommended by oncology -Echocardiogram with normal RV systolic function -Hypercoagulable work-up pending -Remains on room air   Hypotension History of adrenal insufficiency -Blood pressure improved.  Resume home [place]  Stage IIIb malignant melanoma -On immunotherapy -Appreciate Dr. Marin Olp  Diffuse arthralgias -Ongoing for 2 months, extremely debilitating -Secondary to immunotherapy, possibly drug-induced RA, or other inflammatory arthralgias -Rheumatologic serology pending   DVT prophylaxis: IV heparin Code Status: Full Family Communication: None at bedside  Disposition Plan:  . Patient is from home prior to admission. . Currently in-hospital treatment needed due to PE treatment with IV heparin. . Suspect patient will discharge to home tomorrow if remains stable.    Consultants:   Oncology   Procedures:   None   Antimicrobials:  Anti-infectives (From  admission, onward)   Start     Dose/Rate Route Frequency Ordered Stop   08/27/19 1000  hydroxychloroquine (PLAQUENIL) tablet 200 mg  Status:  Discontinued     200 mg Oral Daily 08/26/19 1713 08/27/19 0708   08/26/19 1430  cefTRIAXone (ROCEPHIN) 1 g in sodium chloride 0.9 % 100 mL IVPB     1 g 200 mL/hr over 30 Minutes Intravenous  Once 08/26/19 1417 08/26/19 1719   08/26/19 1430  azithromycin (ZITHROMAX) 500 mg in sodium chloride 0.9 % 250 mL IVPB     500 mg 250 mL/hr over 60 Minutes Intravenous  Once 08/26/19 1417 08/26/19 1719        Objective: Vitals:   08/27/19 2045 08/28/19 0434 08/28/19 0615 08/28/19 0917  BP: 110/74  114/73   Pulse: 86  84   Resp:    18  Temp: 98 F (36.7 C)  98 F (36.7 C) 98.4 F (36.9 C)  TempSrc: Oral Oral Oral Oral  SpO2: 95%  94% 95%  Weight:      Height:        Intake/Output Summary (Last 24 hours) at 08/28/2019 1332 Last data filed at 08/28/2019 1000 Gross per 24 hour  Intake 840 ml  Output 2250 ml  Net -1410 ml   Filed Weights   08/26/19 1700  Weight: 99.8 kg    Examination:  General exam: Appears calm and comfortable  Respiratory system: Clear to auscultation. Respiratory effort normal. No respiratory distress. No conversational dyspnea.  Cardiovascular system: S1 & S2 heard, RRR. No murmurs. No pedal edema. Gastrointestinal system: Abdomen is nondistended, soft and nontender. Normal bowel sounds heard. Central nervous system: Alert and oriented. No focal neurological deficits. Speech clear.  Extremities: Symmetric in appearance  Skin: No rashes, lesions or ulcers on  exposed skin  Psychiatry: Judgement and insight appear normal. Mood & affect appropriate.   Data Reviewed: I have personally reviewed following labs and imaging studies  CBC: Recent Labs  Lab 08/26/19 1331 08/27/19 0028 08/28/19 0249  WBC 6.3 6.0 6.0  NEUTROABS 4.9 4.4  --   HGB 10.2* 10.1* 10.2*  HCT 32.9* 31.8* 32.3*  MCV 82.0 80.1 79.6*  PLT 339 321 359     Basic Metabolic Panel: Recent Labs  Lab 08/26/19 1331 08/26/19 1711 08/27/19 0028 08/28/19 0249  NA 137  --  139 139  K 4.8  --  4.0 3.9  CL 105  --  105 106  CO2 24  --  22 22  GLUCOSE 109*  --  107* 144*  BUN 16  --  15 13  CREATININE 1.03  --  0.99 0.96  CALCIUM 8.3*  --  8.6* 8.9  MG  --  1.8  --   --    GFR: Estimated Creatinine Clearance: 108.9 mL/min (by C-G formula based on SCr of 0.96 mg/dL). Liver Function Tests: Recent Labs  Lab 08/26/19 1331 08/27/19 0028  AST 22 13*  ALT 17 15  ALKPHOS 64 62  BILITOT 0.9 0.5  PROT 6.2* 6.1*  ALBUMIN 3.0* 3.1*   No results for input(s): LIPASE, AMYLASE in the last 168 hours. No results for input(s): AMMONIA in the last 168 hours. Coagulation Profile: Recent Labs  Lab 08/26/19 1331  INR 1.3*   Cardiac Enzymes: No results for input(s): CKTOTAL, CKMB, CKMBINDEX, TROPONINI in the last 168 hours. BNP (last 3 results) No results for input(s): PROBNP in the last 8760 hours. HbA1C: No results for input(s): HGBA1C in the last 72 hours. CBG: No results for input(s): GLUCAP in the last 168 hours. Lipid Profile: No results for input(s): CHOL, HDL, LDLCALC, TRIG, CHOLHDL, LDLDIRECT in the last 72 hours. Thyroid Function Tests: Recent Labs    08/26/19 1712  TSH 0.806   Anemia Panel: Recent Labs    08/27/19 0818  FERRITIN 745*  TIBC 178*  IRON 22*   Sepsis Labs: Recent Labs  Lab 08/26/19 1331 08/26/19 1623 08/26/19 1711  PROCALCITON  --   --  <0.10  LATICACIDVEN 1.0 0.7  --     Recent Results (from the past 240 hour(s))  Blood Culture (routine x 2)     Status: None (Preliminary result)   Collection Time: 08/26/19  1:36 PM   Specimen: BLOOD RIGHT HAND  Result Value Ref Range Status   Specimen Description BLOOD RIGHT HAND  Final   Special Requests   Final    BOTTLES DRAWN AEROBIC AND ANAEROBIC Blood Culture results may not be optimal due to an inadequate volume of blood received in culture bottles    Culture   Final    NO GROWTH 2 DAYS Performed at [facility], [place] [address] [place], [place] [address]    Report Status PENDING  Incomplete  Urine culture     Status: None   Collection Time: 08/26/19  1:49 PM   Specimen: Urine, Clean Catch  Result Value Ref Range Status   Specimen Description URINE, CLEAN CATCH  Final   Special Requests NONE  Final   Culture   Final    NO GROWTH Performed at [facility], [place] [address], [place], [place] [address]    Report Status 08/27/2019 FINAL  Final  Blood Culture (routine x 2)     Status: None (Preliminary result)   Collection  Time: 08/26/19  2:00 PM   Specimen: BLOOD  Result Value Ref Range Status   Specimen Description BLOOD LEFT ANTECUBITAL  Final   Special Requests   Final    BOTTLES DRAWN AEROBIC AND ANAEROBIC Blood Culture results may not be optimal due to an inadequate volume of blood received in culture bottles   Culture   Final    NO GROWTH 2 DAYS Performed at [facility], [place] [address] [place], [place] [address]    Report Status PENDING  Incomplete  Respiratory Panel by PCR     Status: None   Collection Time: 08/26/19  8:30 PM   Specimen: Flu Kit Nasopharyngeal Swab; Respiratory  Result Value Ref Range Status   Adenovirus NOT DETECTED NOT DETECTED Final   Coronavirus 229E NOT DETECTED NOT DETECTED Final    Comment: (NOTE) The Coronavirus on the Respiratory Panel, DOES NOT test for the novel  Coronavirus (2019 nCoV)    Coronavirus HKU1 NOT DETECTED NOT DETECTED Final   Coronavirus NL63 NOT DETECTED NOT DETECTED Final   Coronavirus OC43 NOT DETECTED NOT DETECTED Final   Metapneumovirus NOT DETECTED NOT DETECTED Final   Rhinovirus / Enterovirus NOT DETECTED NOT DETECTED Final   Influenza A NOT DETECTED NOT DETECTED Final   Influenza B NOT DETECTED NOT DETECTED Final   Parainfluenza Virus 1 NOT DETECTED NOT DETECTED Final   Parainfluenza Virus 2 NOT DETECTED NOT DETECTED Final   Parainfluenza Virus 3  NOT DETECTED NOT DETECTED Final   Parainfluenza Virus 4 NOT DETECTED NOT DETECTED Final   Respiratory Syncytial Virus NOT DETECTED NOT DETECTED Final   Bordetella pertussis NOT DETECTED NOT DETECTED Final   Chlamydophila pneumoniae NOT DETECTED NOT DETECTED Final   Mycoplasma pneumoniae NOT DETECTED NOT DETECTED Final    Comment: Performed at [facility], [place]. [address], [place], [place] [address]      Radiology Studies: CT Angio Chest PE W/Cm &/Or Wo Cm  Result Date: 08/26/2019 CLINICAL DATA:  Dyspnea.  Possible pulmonary embolism. EXAM: CT ANGIOGRAPHY CHEST WITH CONTRAST TECHNIQUE: Multidetector CT imaging of the chest was performed using the standard protocol during bolus administration of intravenous contrast. Multiplanar CT image reconstructions and MIPs were obtained to evaluate the vascular anatomy. Automatic exposure control utilized. CONTRAST:  38m OMNIPAQUE IOHEXOL 350 MG/ML SOLN COMPARISON:  January 24, 2019 FINDINGS: Cardiovascular: There are peripheral pulmonary emboli bilaterally with Hampton's humps in the lung bases and lingula. The pulmonary trunk, right pulmonary artery, left pulmonary artery, and bilateral lobar pulmonary arteries are patent without contrast filling defects. There is no right heart strain. Paucity of coronary calcification. Relatively small caliber of the right main coronary artery from the ostia. Mild calcified atherosclerosis of the thoracic aorta arch. A 3.5 cm caliber of the tubular aorta. No thoracic aortic dissection. Mediastinum/Nodes: A reactive-appearing borderline enlarged 12 mm short axis diameter right hilar noncalcified lymph node. No mediastinal adenopathy. Diminutive thyroid. Lungs/Pleura: No pulmonary edema. Patchy consolidation in both lungs, concerning for ischemia. Two 2 cm thick-walled cavitary lesions in the right base, probably ischemic. No emphysema. Upper Abdomen: A 3.5 cm left hemiliver and 2.0 cm right hemiliver simple-appearing cortical  cysts, similarly sized compared to August 2020 and slightly increased in size is compared to the March 14, 2015 CT abdomen study, likely benign hepatic cysts. The bilateral adrenal glands are incompletely imaged. Musculoskeletal: Mild to moderate degenerative changes most pronounced at the lower cervical levels, and a T12 benign bone island. Review of the MIP images  confirms the above findings. Critical Value/emergent results were called by telephone at the time of interpretation on 08/26/2019 at 4:21 pm to provider [facility] , who verbally acknowledged these results. IMPRESSION: Bilateral peripheral pulmonary emboli without right heart strain. Two 2 cm thick-walled cystic lesions in the right base, probably ischemic. Bilateral Hampton's humps. Unenhanced CT chest recommended in 3 months. A reactive-appearing borderline enlarged right hilar noncalcified lymph node. Paucity of coronary calcification. Relatively small caliber of the right main coronary artery from the ostia. Thoracic aortic calcified atherosclerosis. Electronically Signed   By: Revonda Humphrey   On: 08/26/2019 16:22   NM Bone Scan Whole Body  Result Date: 08/27/2019 CLINICAL DATA:  Diffuse bone and joint pain. History of recurrent melanoma, localized, resected. EXAM: NUCLEAR MEDICINE WHOLE BODY BONE SCAN TECHNIQUE: Whole body anterior and posterior images were obtained approximately 3 hours after intravenous injection of radiopharmaceutical. RADIOPHARMACEUTICALS:  21.9 mCi Technetium-47mMDP IV COMPARISON:  PET CT 04/30/2019 FINDINGS: No focal bone uptake to suggest metastatic disease. There is symmetric uptake within both knees, ankles, elbows and wrists in a pattern most consistent with degenerative change. Most prominent degenerative type uptake about both knees, greater on the left. Both kidneys and urinary bladder are visualized. IMPRESSION: 1. No scintigraphic findings to suggest metastatic disease. 2. Multifocal degenerative joint  disease type uptake, most prominent in the knees. Electronically Signed   By: MKeith RakeM.D.   On: 08/27/2019 14:59   DG Chest Port 1 View  Result Date: 08/26/2019 CLINICAL DATA:  Hypotension EXAM: PORTABLE CHEST 1 VIEW COMPARISON:  January 15, 2019. FINDINGS: There is ill-defined airspace opacity in the right base region. There is mild left base atelectasis. Heart size and pulmonary vascularity are within normal limits. No adenopathy. No pneumothorax. No bone lesions. IMPRESSION: Ill-defined airspace opacity in the right base which may represent a degree of pneumonia. No associated consolidation. Atypical organism pneumonia could present in this manner. Mild left base atelectasis. Cardiac silhouette within normal limits.  No evident adenopathy. Electronically Signed   By: WLowella GripIII M.D.   On: 08/26/2019 14:07   ECHOCARDIOGRAM COMPLETE BUBBLE STUDY  Result Date: 08/27/2019    ECHOCARDIOGRAM REPORT   Patient Name:   James HYSONDate of Exam: 08/27/2019 Medical Rec #:  [id number]    Height:       72.0 in Accession #:    [id number]   Weight:       220.0 lb Date of Birth:  111-Dec-1967   BSA:          2.219 m Patient Age:    89years      BP:           180/109 mmHg Patient Gender: M             HR:           87 bpm. Exam Location:  Inpatient Procedure: 2D Echo, Cardiac Doppler, Color Doppler and Saline Contrast Bubble            Study Indications:    Chest Pain 786.50  History:        Patient has no prior history of Echocardiogram examinations.                 Signs/Symptoms:Chest Pain; Risk Factors:Current Smoker.  Sonographer:    JVickie EpleyRDCS Referring Phys: [id number] [place] 1. Left ventricular ejection fraction, by estimation, is 60 to 65%. The left ventricle has normal function.  The left ventricle has no regional wall motion abnormalities. Left ventricular diastolic parameters were normal.  2. Right ventricular systolic function is normal. The right ventricular size is  normal. There is normal pulmonary artery systolic pressure.  3. Left atrial size was mildly dilated.  4. There is scanty and delayed arrival of contrast in the left heart chambers, consistent with transpulmonary circulation. Agitated saline contrast bubble study was negative, with no evidence of any interatrial shunt.  5. The mitral valve is normal in structure. Trivial mitral valve regurgitation. No evidence of mitral stenosis.  6. The aortic valve is normal in structure. Aortic valve regurgitation is not visualized. No aortic stenosis is present.  7. The inferior vena cava is normal in size with greater than 50% respiratory variability, suggesting right atrial pressure of 3 mmHg. FINDINGS  Left Ventricle: Left ventricular ejection fraction, by estimation, is 60 to 65%. The left ventricle has normal function. The left ventricle has no regional wall motion abnormalities. The left ventricular internal cavity size was normal in size. There is  no left ventricular hypertrophy. Left ventricular diastolic parameters were normal. Normal left ventricular filling pressure. Right Ventricle: The right ventricular size is normal. No increase in right ventricular wall thickness. Right ventricular systolic function is normal. There is normal pulmonary artery systolic pressure. The tricuspid regurgitant velocity is 2.46 m/s, and  with an assumed right atrial pressure of 3 mmHg, the estimated right ventricular systolic pressure is 79.8 mmHg. Left Atrium: Left atrial size was mildly dilated. Right Atrium: Right atrial size was normal in size. Pericardium: There is no evidence of pericardial effusion. Mitral Valve: The mitral valve is normal in structure. Normal mobility of the mitral valve leaflets. Trivial mitral valve regurgitation. No evidence of mitral valve stenosis. Tricuspid Valve: The tricuspid valve is normal in structure. Tricuspid valve regurgitation is trivial. No evidence of tricuspid stenosis. Aortic Valve: The aortic  valve is normal in structure. Aortic valve regurgitation is not visualized. No aortic stenosis is present. Pulmonic Valve: The pulmonic valve was normal in structure. Pulmonic valve regurgitation is not visualized. No evidence of pulmonic stenosis. Aorta: The aortic root is normal in size and structure. Venous: The inferior vena cava is normal in size with greater than 50% respiratory variability, suggesting right atrial pressure of 3 mmHg. IAS/Shunts: No atrial level shunt detected by color flow Doppler. Agitated saline contrast was given intravenously to evaluate for intracardiac shunting. Agitated saline contrast bubble study was negative, with no evidence of any interatrial shunt.  LEFT VENTRICLE PLAX 2D LVIDd:         5.69 cm  Diastology LVIDs:         4.30 cm  LV e' lateral:   11.10 cm/s LV PW:         0.80 cm  LV E/e' lateral: 6.3 LV IVS:        0.80 cm  LV e' medial:    7.07 cm/s LVOT diam:     2.30 cm  LV E/e' medial:  9.9 LV SV:         94 LV SV Index:   43 LVOT Area:     4.15 cm  RIGHT VENTRICLE RV S prime:     18.10 cm/s TAPSE (M-mode): 2.6 cm LEFT ATRIUM             Index       RIGHT ATRIUM           Index LA diam:  3.90 cm 1.76 cm/m  RA Area:     15.80 cm LA Vol (A2C):   56.2 ml 25.33 ml/m RA Volume:   43.70 ml  19.70 ml/m LA Vol (A4C):   46.3 ml 20.87 ml/m LA Biplane Vol: 51.0 ml 22.99 ml/m  AORTIC VALVE LVOT Vmax:   144.00 cm/s LVOT Vmean:  93.600 cm/s LVOT VTI:    0.227 m  AORTA Ao Root diam: 3.40 cm MITRAL VALVE               TRICUSPID VALVE MV Area (PHT): 4.86 cm    TR Peak grad:   24.2 mmHg MV Decel Time: 156 msec    TR Vmax:        246.00 cm/s MV E velocity: 69.80 cm/s MV A velocity: 65.60 cm/s  SHUNTS MV E/A ratio:  1.06        Systemic VTI:  0.23 m                            Systemic Diam: 2.30 cm Dani Gobble Croitoru MD Electronically signed by Sanda Klein MD Signature Date/Time: 08/27/2019/2:20:22 PM    Final    VAS [place] LOWER EXTREMITY VENOUS (DVT)  Result Date: 08/27/2019  Lower  Venous DVTStudy Indications: Pain.  Risk Factors: Confirmed PE. Anticoagulation: Heparin. Comparison Study: 06/04/19 negative LLE Performing Technologist: Antonieta Pert RDMS, RVT  Examination Guidelines: A complete evaluation includes B-mode imaging, spectral Doppler, color Doppler, and power Doppler as needed of all accessible portions of each vessel. Bilateral testing is considered an integral part of a complete examination. Limited examinations for reoccurring indications may be performed as noted. The reflux portion of the exam is performed with the patient in reverse Trendelenburg.  +---------+---------------+---------+-----------+----------+-------------------+ RIGHT    CompressibilityPhasicitySpontaneityPropertiesThrombus Aging      +---------+---------------+---------+-----------+----------+-------------------+ CFV      Full                                         abnormal wave form,                                                       possible fistula?   +---------+---------------+---------+-----------+----------+-------------------+ SFJ      Full                                         abnormal venous                                                           waveform            +---------+---------------+---------+-----------+----------+-------------------+ FV Prox  Full                                         abnormal venous  waveform            +---------+---------------+---------+-----------+----------+-------------------+ FV Mid   Full                                                             +---------+---------------+---------+-----------+----------+-------------------+ FV DistalFull                                                             +---------+---------------+---------+-----------+----------+-------------------+ PFV      Full                                                              +---------+---------------+---------+-----------+----------+-------------------+ POP      Full           Yes      Yes                                      +---------+---------------+---------+-----------+----------+-------------------+ PTV      Full                                                             +---------+---------------+---------+-----------+----------+-------------------+ PERO     Full                                                             +---------+---------------+---------+-----------+----------+-------------------+ GSV      Full                                                             +---------+---------------+---------+-----------+----------+-------------------+ >5cm bakers cyst.  +---------+---------------+---------+-----------+----------+--------------+ LEFT     CompressibilityPhasicitySpontaneityPropertiesThrombus Aging +---------+---------------+---------+-----------+----------+--------------+ CFV      Full           Yes      Yes                                 +---------+---------------+---------+-----------+----------+--------------+ SFJ      Full                                                        +---------+---------------+---------+-----------+----------+--------------+  FV Prox  Full                                                        +---------+---------------+---------+-----------+----------+--------------+ FV Mid   Full                                                        +---------+---------------+---------+-----------+----------+--------------+ FV DistalFull                                                        +---------+---------------+---------+-----------+----------+--------------+ PFV      Full                                                        +---------+---------------+---------+-----------+----------+--------------+ POP      Full           Yes       Yes                                 +---------+---------------+---------+-----------+----------+--------------+ PTV      Full                                                        +---------+---------------+---------+-----------+----------+--------------+ PERO     Full                                                        +---------+---------------+---------+-----------+----------+--------------+ GSV      Full                                                        +---------+---------------+---------+-----------+----------+--------------+ >3cm bakers cyst    Summary: RIGHT: - There is no evidence of deep vein thrombosis in the lower extremity.  - A cystic structure is found in the popliteal fossa. - Abnormal venous waveforms in the Right CFV, SFV and DFA, suggestive of AVF.  LEFT: - There is no evidence of deep vein thrombosis in the lower extremity.  - A cystic structure is found in the popliteal fossa.  *See table(s) above for measurements and observations. Electronically signed by Harold Barban MD on 08/27/2019 at 5:49:03 PM.    Final       Scheduled Meds: . vitamin C  1,000 mg Oral Daily  .  cholecalciferol  2,000 Units Oral Daily  . gabapentin  600 mg Oral BID  . hydrocortisone sod succinate (SOLU-CORTEF) inj  50 mg Intravenous Q8H  . mirabegron ER  50 mg Oral QHS  . potassium chloride  20 mEq Oral QODAY  . pyridOXINE  100 mg Oral Daily  . cyanocobalamin  2,000 mcg Oral Daily   Continuous Infusions: . heparin 2,750 Units/hr (08/28/19 1331)  . iron dextran (INFED/DEXFERRUM) infusion 1,000 mg (08/28/19 1327)     LOS: 1 day      Time spent: 35 minutes   Dessa Phi, DO [facility] 08/28/2019, 1:32 PM   Available via Epic secure chat 7am-7pm After these hours, please refer to coverage provider listed on [email or web address]

## 2019-08-28 NOTE — Progress Notes (Signed)
ANTICOAGULATION CONSULT NOTE  Pharmacy Consult for Heparin Indication: pulmonary embolus  Assessment: 10 yoM with recurrent melanoma admitted with SOB and found to have acute bilateral PE without RHS. Pt currently on IV heparin.  Heparin level remains subtherapeutic but trending up, CBC stable, no S/Sx bleeding per RN.   No known issues with IV infusion.  Goal of Therapy:  Heparin level 0.3-0.7 units/ml Monitor platelets by anticoagulation protocol: Yes   Plan:  -Increase IV heparin to 2750 units/hr. -Recheck heparin level in 6hr -Daily heparin level and CBC.  Thanks for allowing pharmacy to be a part of this patient's care.  James Byrd, PharmD Clinical Pharmacist  08/28/2019 1:07 AM

## 2019-08-28 NOTE — Progress Notes (Signed)
I really should not be surprised that the Doppler of his legs did not show any evidence of thromboembolic disease.  He had a bone scan.  The bone scan really did not show much in the way of bone problems although there is arthritic changes in his knees.  He is still iron deficient.  I think he just does not absorb iron all that well.  I will try a dose of iron and dextran.  He will definitely need the test dose and also the premedication with Solu-Medrol and Pepcid.  His blood pressure is better.  His heart rate is coming down.  His oxygen saturation is better.  He is on heparin.  I would keep on IV heparin for 1 day and then switch him over to Lovenox.  Of note, his heparin levels have been on the low side.  I am sure pharmacy will adjust the rate of heparin to get him more therapeutic.  A hypercoagulable panel was sent off.  This will gradually come back over the next week or so.  His labs look pretty good.  His white cell count is 6.  Hemoglobin 10.2.  Platelet count 359,000.  His sodium is 139.  Potassium 3.9.  His creatinine is 0.96.  Calcium 8.9.  His vital signs show temperature 98.  Pulse 84.  Blood pressure 114/73.  His lungs sound good bilaterally.  His cardiac exam regular rate and rhythm with no murmurs, rubs or bruits.  Abdomen is soft.  Bowel sounds are present.  There is no fluid wave.  There is no palpable liver or spleen tip.  Extremities shows no clubbing, cyanosis or edema.  Neurological exam is nonfocal.  I do think physical therapy would not be a bad idea for him.  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  I will know if this is contributing to the pain that he has with his knees.  I know he has seen orthopedic surgery as an outpatient.  Does not sound like they are to enthusiastic to do anything about these cysts.  Hopefully, he will do well with the IV iron.  Should be able to get enough iron into him.  Hopefully, once he gets onto Lovenox, he will be able to go  home in a day or so.  I appreciate so much the outstanding care that he is getting from all the staff up on [address].  Lattie Haw, MD  Psalm 40:5

## 2019-08-28 NOTE — Progress Notes (Signed)
ANTICOAGULATION CONSULT NOTE  Pharmacy Consult for Heparin Indication: pulmonary embolus  Assessment: 69 yoM with recurrent melanoma admitted with SOB and found to have acute bilateral PE without RHS. Pt currently on IV heparin.  Heparin level now therapeutic  Goal of Therapy:  Heparin level 0.3-0.7 units/ml Monitor platelets by anticoagulation protocol: Yes   Plan:  -Increase IV heparin to 2850 units/hr to prevent drop. -Planning Lovenox soon - 100 mg sq Q 12  -Daily heparin level and CBC.  Thank you Anette Guarneri, PharmD [phone]  08/28/2019 9:55 AM

## 2019-08-28 NOTE — Progress Notes (Signed)
Physical Therapy Treatment Patient Details Name: James Byrd MRN: [id number] DOB: 10-24-1965 Today's Date: 08/28/2019    History of Present Illness Pt is a 54 yo male presenting with shortness of breath and hypotension, found to have bilateral PEs upon admission. Pt PMH includes: adrenal insufficiency, anxiety, arthritis, and malignant melanoma to the pancreas who is currently getting monthly chemotherapy.    PT Comments    Patient is making progress toward PT goals and tolerated increased mobility well. Pt reports feeling much better today overall compared to yesterday. Pt presents with impaired balance and will continue to benefit from further skilled PT services to maximize independence and safety with mobility.     Follow Up Recommendations  Home health PT;Supervision for mobility/OOB     Equipment Recommendations  None recommended by PT    Recommendations for Other Services       Precautions / Restrictions Precautions Precautions: Fall Restrictions Weight Bearing Restrictions: No    Mobility  Bed Mobility Overal bed mobility: Independent                Transfers Overall transfer level: Needs assistance Equipment used: None Transfers: Sit to/from Stand Sit to Stand: Supervision            Ambulation/Gait Ambulation/Gait assistance: Min guard Gait Distance (Feet): 400 Feet Assistive device: None Gait Pattern/deviations: Step-through pattern;Decreased stride length;Narrow base of support;Drifts right/left Gait velocity: decreased   General Gait Details: min guard for safety given impaired balance and tendency to have varying BOS from very narrow (nearly scissoring) to wide and drifting R and L; no overt LOB    Stairs             Wheelchair Mobility    Modified Rankin (Stroke Patients Only)       Balance Overall balance assessment: Needs assistance Sitting-balance support: No upper extremity supported;Feet supported Sitting  balance-Leahy Scale: Good     Standing balance support: No upper extremity supported;During functional activity Standing balance-Leahy Scale: Fair                              Cognition Arousal/Alertness: Awake/alert Behavior During Therapy: WFL for tasks assessed/performed;Flat affect Overall Cognitive Status: Within Functional Limits for tasks assessed                                        Exercises      General Comments        Pertinent Vitals/Pain Pain Assessment: No/denies pain    Home Living                      Prior Function            PT Goals (current goals can now be found in the care plan section) Progress towards PT goals: Progressing toward goals    Frequency    Min 3X/week      PT Plan Current plan remains appropriate    Co-evaluation              AM-PAC PT "6 Clicks" Mobility   Outcome Measure  Help needed turning from your back to your side while in a flat bed without using bedrails?: None Help needed moving from lying on your back to sitting on the side of a flat bed without using bedrails?: None Help needed moving to and  from a bed to a chair (including a wheelchair)?: A Little Help needed standing up from a chair using your arms (e.g., wheelchair or bedside chair)?: A Little Help needed to walk in hospital room?: A Little Help needed climbing 3-5 steps with a railing? : A Little 6 Click Score: 20    End of Session Equipment Utilized During Treatment: Gait belt Activity Tolerance: Patient tolerated treatment well Patient left: in bed;with call bell/phone within reach;with family/visitor present Nurse Communication: Mobility status PT Visit Diagnosis: Other abnormalities of gait and mobility (R26.89);Difficulty in walking, not elsewhere classified (R26.2);Muscle weakness (generalized) (M62.81);Pain Pain - Right/Left: (bilateral) Pain - part of body: Hand;Hip;Ankle and joints of foot;Knee      Time: 9629-5284 PT Time Calculation (min) (ACUTE ONLY): 25 min  Charges:  $Gait Training: 23-37 mins                     Earney Navy, PTA Acute Rehabilitation Services Pager: [phone] Office: [phone]     Darliss Cheney 08/28/2019, 5:04 PM

## 2019-08-29 LAB — PTT-LA MIX: PTT-LA Mix: 82 s — ABNORMAL HIGH (ref 0.0–48.9)

## 2019-08-29 LAB — DRVVT MIX: dRVVT Mix: 43.3 s — ABNORMAL HIGH (ref 0.0–40.4)

## 2019-08-29 LAB — CBC
HCT: 31.3 % — ABNORMAL LOW (ref 39.0–52.0)
Hemoglobin: 10.1 g/dL — ABNORMAL LOW (ref 13.0–17.0)
MCH: 25.7 pg — ABNORMAL LOW (ref 26.0–34.0)
MCHC: 32.3 g/dL (ref 30.0–36.0)
MCV: 79.6 fL — ABNORMAL LOW (ref 80.0–100.0)
Platelets: 380 10*3/uL (ref 150–400)
RBC: 3.93 MIL/uL — ABNORMAL LOW (ref 4.22–5.81)
RDW: 16.5 % — ABNORMAL HIGH (ref 11.5–15.5)
WBC: 9 10*3/uL (ref 4.0–10.5)
nRBC: 0 % (ref 0.0–0.2)

## 2019-08-29 LAB — DRVVT CONFIRM: dRVVT Confirm: 1.2 ratio (ref 0.8–1.2)

## 2019-08-29 LAB — LUPUS ANTICOAGULANT PANEL
DRVVT: 54.9 s — ABNORMAL HIGH (ref 0.0–47.0)
PTT Lupus Anticoagulant: 93.8 s — ABNORMAL HIGH (ref 0.0–51.9)

## 2019-08-29 LAB — HEXAGONAL PHASE PHOSPHOLIPID: Hexagonal Phase Phospholipid: 0 s (ref 0–11)

## 2019-08-29 LAB — PROTEIN C, TOTAL: Protein C, Total: 84 % (ref 60–150)

## 2019-08-29 LAB — RHEUMATOID FACTOR: Rheumatoid fact SerPl-aCnc: 14.8 IU/mL — ABNORMAL HIGH (ref 0.0–13.9)

## 2019-08-29 LAB — HEPARIN LEVEL (UNFRACTIONATED): Heparin Unfractionated: 0.74 IU/mL — ABNORMAL HIGH (ref 0.30–0.70)

## 2019-08-29 MED ORDER — ENOXAPARIN SODIUM 150 MG/ML ~~LOC~~ SOLN: 1.5000 mg/kg | SUBCUTANEOUS | 2 refills | Status: DC

## 2019-08-29 MED ORDER — ENOXAPARIN (LOVENOX) PATIENT EDUCATION KIT
PACK | Freq: Once | Status: DC
  Filled 2019-08-29: qty 1

## 2019-08-29 MED ORDER — ENOXAPARIN SODIUM 150 MG/ML ~~LOC~~ SOLN
1.5000 mg/kg | SUBCUTANEOUS | Status: DC
  Administered 2019-08-29: 150 mg via SUBCUTANEOUS
  Filled 2019-08-29: qty 1

## 2019-08-29 MED FILL — ENOXAPARIN SODIUM 150 MG/ML: 150 | 30 days supply | Qty: 30 | Fill #0

## 2019-08-29 NOTE — Progress Notes (Signed)
James Byrd is having a tough time emotionally this morning.  He is just trying to deal with all the problems that he has had.  He is not sure he is going to take any more immunotherapy.  I told him that if he wanted to stop I would have no problems with this.  I told him that even if he completed the full year of immunotherapy, there is no guarantee that the melanoma will not come back.  He says he has not heard for 2 days.  I am not sure the heparin may be having some effect on his joints.  His bone scan certainly do not show [place] anything that looks like inflammatory arthropathy.  I told him that there is no reason to put him back on Plaquenil.  He has bad arthritis in his knees.  He has a Bakers cysts.  He needs to see his orthopedist down in [place].  We will now move him over to Lovenox.  I think we get away with daily Lovenox.  I just hope that he and his wife will be able to do this at home.  I will know if he might need some home health nurse to come out to give him the Lovenox injections.  He got the IV iron yesterday.  He walked yesterday.  Physical therapy helped him out.  He actually did better than I would have thought.  His labs this morning shows white cell count to be 9.  Hemoglobin 10.1.  Platelet count 380,000.  On his exam, his vital signs are temperature of 97.5.  Pulse 77 here.  Blood pressure 113/64.  His lungs are clear bilaterally.  I hear no friction rubs.  I hear no wheezes.  Cardiac exam regular rate and rhythm with no murmurs, rubs or bruits.  Abdomen is soft.  There is no fluid wave.  There is no guarding or rebound tenderness.  There is no palpable liver or spleen tip.  Extremities shows no clubbing, cyanosis or edema.  Is no swelling of his joints.  He has decent range of motion of his joints.  Neurological exam is nonfocal.  Again, I spent about 45 minutes with him today.  He just has a lot of issues try to deal with emotionally.  He is just is being worn out physically  from all the problems that he has had.  Again, I think he was switched over to Lovenox.  I think that daily Lovenox would be reasonable at 150 mg subcu daily.  I told him he probably will need this for 3 months and then if the emboli are clear on his next CT angiogram, then we will switch him over to an oral agent.  Again, I suspect that it may help to have some home health nurse come out to do the Lovenox injections for him.  I know that he has gotten outstanding care from all the staff up on [address].  Hopefully, he will be able to go home today or tomorrow at the latest.     Lattie Haw, MD  1 Corinthians 16:13

## 2019-08-29 NOTE — Progress Notes (Signed)
Discharge:  IV's removed. CCMD notified.  Lovenox teaching and patient self administration completed.  Lovenox patient education kit given. Discharge summary and medication reviewed.  Answered all questions.   Assisted to wife's vehicle by staff.

## 2019-08-29 NOTE — TOC Transition Note (Signed)
Transition of Care [facility]) - CM/SW Discharge Note Marvetta Gibbons RN, BSN Transitions of Care Unit 4E- RN Case Manager [phone]   Patient Details  Name: James Byrd MRN: [id number] Date of Birth: 1965-10-09  Transition of Care [facility]) CM/SW Contact:  Dawayne Patricia, RN Phone Number: 08/29/2019, 11:56 AM   Clinical Narrative:    Pt stable for transition home, orders placed for HHRN/PT/OT, pt will be going home on Lovenox injections. Bedside RN to educate. Cm spoke with pt at bedside for [facility] choice- list provided for choice Per CMS guidelines from medicare.gov website with star ratings (copy placed in shadow chart)- per pt he has no proference as long as agency is in net work with insurance- Will start with [facility]- pt agreeable- call made to [facility] with [facility] for [facility] referral- referral has been accepted with [facility] for Satuday March 13. No DME needs noted, spouse to transport, address, phone # and PCP all confirmed with pt in epic.    Final next level of care: Home w Home Health Services Barriers to Discharge: No Barriers Identified   Patient Goals and CMS Choice Patient states their goals for this hospitalization and ongoing recovery are:: to be around for my 60 mo old grandbaby CMS Medicare.gov Compare Post Acute Care list provided to:: Patient    Discharge Placement               Home with [facility]        Discharge Plan and Services   Discharge Planning Services: CM Consult Post Acute Care Choice: Home Health          DME Arranged: N/A DME Agency: NA       HH Arranged: RN, PT, OT [place] Agency: Old Agency Date [facility] Agency Contacted: 08/29/19 Time [place]: 1030 Representative spoke with at [place]: [place]) Interventions     Readmission Risk Interventions No flowsheet data found.

## 2019-08-29 NOTE — Discharge Summary (Signed)
Physician Discharge Summary  James Byrd ION:[id number] DOB: 1965/09/18 DOA: 08/26/2019  PCP: Emmaline Kluver, MD  Admit date: 08/26/2019 Discharge date: 08/29/2019  Admitted From: Home Disposition:  Home with home health   Recommendations for Outpatient Follow-up:  Follow up with Dr. Marin Olp as scheduled on 09/11/2019 Please follow up on pending hypercoagulable panel as well as rheumatology serology panel results   Discharge Condition: Stable CODE STATUS: Full  Diet recommendation: Regular diet   Brief/Interim Summary: James Byrd is a 54 y.o.malewith medical historyof stage IIIb malignant melanoma with nodal metastasis, history of neuroendocrine carcinoma of pancreas, treated with immunotherapy and subsequently debilitated by severe ongoing arthralgias and pain,presented to the emergency room with hypotension yesterday. On further work-up was noted to have bilateral pulmonary embolism with lung infarction and patient was started on IV heparin.  Discharge Diagnoses:  Active Problems:   Adrenal insufficiency (HCC)   Bilateral pulmonary embolism (HCC)   Acute bilateral pulmonary embolism with lung infarction -Admitted with hypotension, blood pressure improved  -IV heparin --> Lovenox on discharge.  Patient willing to undergo Lovenox teaching and discharged on Lovenox as recommended by oncology  -Echocardiogram with normal RV systolic function -Hypercoagulable work-up pending -Remains on room air  Hypotension History of adrenal insufficiency -Blood pressure improved. BP 113/64 this morning. Resume home Cortef  Stage IIIb malignant melanoma -Was on immunotherapy -Appreciate Dr. Marin Olp  Diffuse arthralgias -Ongoing for 2 months, extremely debilitating -Secondary to immunotherapy,possibly drug-induced RA,or other inflammatory arthralgias -Rheumatologic serology pending. Has Rheum appointment in April 2021    Discharge Instructions  Discharge Instructions     Call MD for:  difficulty breathing, headache or visual disturbances   Complete by: As directed    Call MD for:  extreme fatigue   Complete by: As directed    Call MD for:  persistant dizziness or light-headedness   Complete by: As directed    Call MD for:  persistant nausea and vomiting   Complete by: As directed    Call MD for:  severe uncontrolled pain   Complete by: As directed    Call MD for:  temperature >100.4   Complete by: As directed    Diet general   Complete by: As directed    Discharge instructions   Complete by: As directed    You were cared for by a hospitalist during your hospital stay. If you have any questions about your discharge medications or the care you received while you were in the hospital after you are discharged, you can call the unit and ask to speak with the hospitalist on call if the hospitalist that took care of you is not available. Once you are discharged, your primary care physician will handle any further medical issues. Please note that NO REFILLS for any discharge medications will be authorized once you are discharged, as it is imperative that you return to your primary care physician (or establish a relationship with a primary care physician if you do not have one) for your aftercare needs so that they can reassess your need for medications and monitor your lab values.   Increase activity slowly   Complete by: As directed      Allergies as of 08/29/2019   No Known Allergies     Medication List    STOP taking these medications   hydroxychloroquine 200 MG tablet Commonly known as: Plaquenil   levofloxacin 500 MG tablet Commonly known as: Levaquin   promethazine 25 MG suppository Commonly known as: PHENERGAN  TAKE these medications   acetaminophen 650 MG CR tablet Commonly known as: TYLENOL Take 1,300 mg by mouth daily as needed for pain.   augmented betamethasone dipropionate 0.05 % cream Commonly known as: DIPROLENE-AF Apply 1  application topically 2 (two) times daily as needed (psoriasis).   benzonatate 200 MG capsule Commonly known as: TESSALON Take 200 mg by mouth 3 (three) times daily as needed for cough.   cyanocobalamin 2000 MCG tablet Take 2,000 mcg by mouth daily. Vitamin b12   diclofenac sodium 1 % Gel Commonly known as: VOLTAREN Apply 2 g topically daily as needed (pain).   enoxaparin 150 MG/ML injection Commonly known as: LOVENOX Inject 1 mL (150 mg total) into the skin daily.   Fusion Plus Caps Take 1 capsule by mouth daily.   gabapentin 600 MG tablet Commonly known as: NEURONTIN Take 600 mg by mouth 2 (two) times daily.   GLUCOSAMINE-CHONDROITIN PO Take 1 tablet by mouth 2 (two) times daily.   hydrocortisone 5 MG tablet Commonly known as: Cortef Take 1 tablet (5 mg total) by mouth daily. What changed: when to take this   ketorolac 10 MG tablet Commonly known as: TORADOL Take 1 tablet (10 mg total) by mouth every 8 (eight) hours as needed. What changed: reasons to take this   multivitamin with minerals Tabs tablet Take 1 tablet by mouth daily.   Myrbetriq 50 MG Tb24 tablet Generic drug: mirabegron ER Take 50 mg by mouth at bedtime.   ondansetron 4 MG disintegrating tablet Commonly known as: Zofran ODT Take 1 tablet (4 mg total) by mouth every 8 (eight) hours as needed for nausea or vomiting.   oxyCODONE-acetaminophen 5-325 MG tablet Commonly known as: PERCOCET/ROXICET Take 1 tablet by mouth every 8 (eight) hours as needed for severe pain. Patient is unsure of dosage. What changed: additional instructions   Potassium Chloride ER 20 MEQ Tbcr Take 1 pill every other day What changed:   how much to take  how to take this  when to take this  additional instructions   pyridOXINE 100 MG tablet Commonly known as: VITAMIN B-6 Take 100 mg by mouth daily.   Testosterone 20.25 MG/ACT (1.62%) Gel Apply 20.25 mg topically See admin instructions. Apply 1 pump (20.25 mg)  topically on each shoulder once daily   vitamin C 1000 MG tablet Take 1,000 mg by mouth daily.   Vitamin D 50 MCG (2000 UT) tablet Take 2,000 Units by mouth daily.      Follow-up Information    [name], MD. Go on 09/11/2019.   Specialty: Oncology Contact information: [place] [address] [phone]          No Known Allergies  Consultations:  Oncology    Procedures/Studies: CT Angio Chest PE W/Cm &/Or Wo Cm  Result Date: 08/26/2019 CLINICAL DATA:  Dyspnea.  Possible pulmonary embolism. EXAM: CT ANGIOGRAPHY CHEST WITH CONTRAST TECHNIQUE: Multidetector CT imaging of the chest was performed using the standard protocol during bolus administration of intravenous contrast. Multiplanar CT image reconstructions and MIPs were obtained to evaluate the vascular anatomy. Automatic exposure control utilized. CONTRAST:  7m OMNIPAQUE IOHEXOL 350 MG/ML SOLN COMPARISON:  January 24, 2019 FINDINGS: Cardiovascular: There are peripheral pulmonary emboli bilaterally with Hampton's humps in the lung bases and lingula. The pulmonary trunk, right pulmonary artery, left pulmonary artery, and bilateral lobar pulmonary arteries are patent without contrast filling defects. There is no right heart strain. Paucity of coronary calcification. Relatively small  caliber of the right main coronary artery from the ostia. Mild calcified atherosclerosis of the thoracic aorta arch. A 3.5 cm caliber of the tubular aorta. No thoracic aortic dissection. Mediastinum/Nodes: A reactive-appearing borderline enlarged 12 mm short axis diameter right hilar noncalcified lymph node. No mediastinal adenopathy. Diminutive thyroid. Lungs/Pleura: No pulmonary edema. Patchy consolidation in both lungs, concerning for ischemia. Two 2 cm thick-walled cavitary lesions in the right base, probably ischemic. No emphysema. Upper Abdomen: A 3.5 cm left hemiliver and 2.0 cm right hemiliver simple-appearing  cortical cysts, similarly sized compared to August 2020 and slightly increased in size is compared to the March 14, 2015 CT abdomen study, likely benign hepatic cysts. The bilateral adrenal glands are incompletely imaged. Musculoskeletal: Mild to moderate degenerative changes most pronounced at the lower cervical levels, and a T12 benign bone island. Review of the MIP images confirms the above findings. Critical Value/emergent results were called by telephone at the time of interpretation on 08/26/2019 at 4:21 pm to provider [facility] , who verbally acknowledged these results. IMPRESSION: Bilateral peripheral pulmonary emboli without right heart strain. Two 2 cm thick-walled cystic lesions in the right base, probably ischemic. Bilateral Hampton's humps. Unenhanced CT chest recommended in 3 months. A reactive-appearing borderline enlarged right hilar noncalcified lymph node. Paucity of coronary calcification. Relatively small caliber of the right main coronary artery from the ostia. Thoracic aortic calcified atherosclerosis. Electronically Signed   By: Revonda Humphrey   On: 08/26/2019 16:22   NM Bone Scan Whole Body  Result Date: 08/27/2019 CLINICAL DATA:  Diffuse bone and joint pain. History of recurrent melanoma, localized, resected. EXAM: NUCLEAR MEDICINE WHOLE BODY BONE SCAN TECHNIQUE: Whole body anterior and posterior images were obtained approximately 3 hours after intravenous injection of radiopharmaceutical. RADIOPHARMACEUTICALS:  21.9 mCi Technetium-18mMDP IV COMPARISON:  PET CT 04/30/2019 FINDINGS: No focal bone uptake to suggest metastatic disease. There is symmetric uptake within both knees, ankles, elbows and wrists in a pattern most consistent with degenerative change. Most prominent degenerative type uptake about both knees, greater on the left. Both kidneys and urinary bladder are visualized. IMPRESSION: 1. No scintigraphic findings to suggest metastatic disease. 2. Multifocal degenerative  joint disease type uptake, most prominent in the knees. Electronically Signed   By: MKeith RakeM.D.   On: 08/27/2019 14:59   DG Chest Port 1 View  Result Date: 08/26/2019 CLINICAL DATA:  Hypotension EXAM: PORTABLE CHEST 1 VIEW COMPARISON:  January 15, 2019. FINDINGS: There is ill-defined airspace opacity in the right base region. There is mild left base atelectasis. Heart size and pulmonary vascularity are within normal limits. No adenopathy. No pneumothorax. No bone lesions. IMPRESSION: Ill-defined airspace opacity in the right base which may represent a degree of pneumonia. No associated consolidation. Atypical organism pneumonia could present in this manner. Mild left base atelectasis. Cardiac silhouette within normal limits.  No evident adenopathy. Electronically Signed   By: WLowella GripIII M.D.   On: 08/26/2019 14:07   ECHOCARDIOGRAM COMPLETE BUBBLE STUDY  Result Date: 08/27/2019    ECHOCARDIOGRAM REPORT   Patient Name:   PJOSEF TOURIGNYDate of Exam: 08/27/2019 Medical Rec #:  [id number]    Height:       72.0 in Accession #:    [id number]   Weight:       220.0 lb Date of Birth:  110/23/1967   BSA:          2.219 m Patient Age:    89years  BP:           180/109 mmHg Patient Gender: M             HR:           87 bpm. Exam Location:  Inpatient Procedure: 2D Echo, Cardiac Doppler, Color Doppler and Saline Contrast Bubble            Study Indications:    Chest Pain 786.50  History:        Patient has no prior history of Echocardiogram examinations.                 Signs/Symptoms:Chest Pain; Risk Factors:Current Smoker.  Sonographer:    Vickie Epley RDCS Referring Phys: [id number] RAVI [place]  1. Left ventricular ejection fraction, by estimation, is 60 to 65%. The left ventricle has normal function. The left ventricle has no regional wall motion abnormalities. Left ventricular diastolic parameters were normal.  2. Right ventricular systolic function is normal. The right ventricular size  is normal. There is normal pulmonary artery systolic pressure.  3. Left atrial size was mildly dilated.  4. There is scanty and delayed arrival of contrast in the left heart chambers, consistent with transpulmonary circulation. Agitated saline contrast bubble study was negative, with no evidence of any interatrial shunt.  5. The mitral valve is normal in structure. Trivial mitral valve regurgitation. No evidence of mitral stenosis.  6. The aortic valve is normal in structure. Aortic valve regurgitation is not visualized. No aortic stenosis is present.  7. The inferior vena cava is normal in size with greater than 50% respiratory variability, suggesting right atrial pressure of 3 mmHg. FINDINGS  Left Ventricle: Left ventricular ejection fraction, by estimation, is 60 to 65%. The left ventricle has normal function. The left ventricle has no regional wall motion abnormalities. The left ventricular internal cavity size was normal in size. There is  no left ventricular hypertrophy. Left ventricular diastolic parameters were normal. Normal left ventricular filling pressure. Right Ventricle: The right ventricular size is normal. No increase in right ventricular wall thickness. Right ventricular systolic function is normal. There is normal pulmonary artery systolic pressure. The tricuspid regurgitant velocity is 2.46 m/s, and  with an assumed right atrial pressure of 3 mmHg, the estimated right ventricular systolic pressure is 29.4 mmHg. Left Atrium: Left atrial size was mildly dilated. Right Atrium: Right atrial size was normal in size. Pericardium: There is no evidence of pericardial effusion. Mitral Valve: The mitral valve is normal in structure. Normal mobility of the mitral valve leaflets. Trivial mitral valve regurgitation. No evidence of mitral valve stenosis. Tricuspid Valve: The tricuspid valve is normal in structure. Tricuspid valve regurgitation is trivial. No evidence of tricuspid stenosis. Aortic Valve: The  aortic valve is normal in structure. Aortic valve regurgitation is not visualized. No aortic stenosis is present. Pulmonic Valve: The pulmonic valve was normal in structure. Pulmonic valve regurgitation is not visualized. No evidence of pulmonic stenosis. Aorta: The aortic root is normal in size and structure. Venous: The inferior vena cava is normal in size with greater than 50% respiratory variability, suggesting right atrial pressure of 3 mmHg. IAS/Shunts: No atrial level shunt detected by color flow Doppler. Agitated saline contrast was given intravenously to evaluate for intracardiac shunting. Agitated saline contrast bubble study was negative, with no evidence of any interatrial shunt.  LEFT VENTRICLE PLAX 2D LVIDd:         5.69 cm  Diastology LVIDs:  4.30 cm  LV e' lateral:   11.10 cm/s LV PW:         0.80 cm  LV E/e' lateral: 6.3 LV IVS:        0.80 cm  LV e' medial:    7.07 cm/s LVOT diam:     2.30 cm  LV E/e' medial:  9.9 LV SV:         94 LV SV Index:   43 LVOT Area:     4.15 cm  RIGHT VENTRICLE RV S prime:     18.10 cm/s TAPSE (M-mode): 2.6 cm LEFT ATRIUM             Index       RIGHT ATRIUM           Index LA diam:        3.90 cm 1.76 cm/m  RA Area:     15.80 cm LA Vol (A2C):   56.2 ml 25.33 ml/m RA Volume:   43.70 ml  19.70 ml/m LA Vol (A4C):   46.3 ml 20.87 ml/m LA Biplane Vol: 51.0 ml 22.99 ml/m  AORTIC VALVE LVOT Vmax:   144.00 cm/s LVOT Vmean:  93.600 cm/s LVOT VTI:    0.227 m  AORTA Ao Root diam: 3.40 cm MITRAL VALVE               TRICUSPID VALVE MV Area (PHT): 4.86 cm    TR Peak grad:   24.2 mmHg MV Decel Time: 156 msec    TR Vmax:        246.00 cm/s MV E velocity: 69.80 cm/s MV A velocity: 65.60 cm/s  SHUNTS MV E/A ratio:  1.06        Systemic VTI:  0.23 m                            Systemic Diam: 2.30 cm Dani Gobble Croitoru MD Electronically signed by Sanda Klein MD Signature Date/Time: 08/27/2019/2:20:22 PM    Final    VAS [place] LOWER EXTREMITY VENOUS (DVT)  Result Date: 08/27/2019   Lower Venous DVTStudy Indications: Pain.  Risk Factors: Confirmed PE. Anticoagulation: Heparin. Comparison Study: 06/04/19 negative LLE Performing Technologist: Antonieta Pert RDMS, RVT  Examination Guidelines: A complete evaluation includes B-mode imaging, spectral Doppler, color Doppler, and power Doppler as needed of all accessible portions of each vessel. Bilateral testing is considered an integral part of a complete examination. Limited examinations for reoccurring indications may be performed as noted. The reflux portion of the exam is performed with the patient in reverse Trendelenburg.  +---------+---------------+---------+-----------+----------+-------------------+ RIGHT    CompressibilityPhasicitySpontaneityPropertiesThrombus Aging      +---------+---------------+---------+-----------+----------+-------------------+ CFV      Full                                         abnormal wave form,                                                       possible fistula?   +---------+---------------+---------+-----------+----------+-------------------+ SFJ      Full  abnormal venous                                                           waveform            +---------+---------------+---------+-----------+----------+-------------------+ FV Prox  Full                                         abnormal venous                                                           waveform            +---------+---------------+---------+-----------+----------+-------------------+ FV Mid   Full                                                             +---------+---------------+---------+-----------+----------+-------------------+ FV DistalFull                                                             +---------+---------------+---------+-----------+----------+-------------------+ PFV      Full                                                              +---------+---------------+---------+-----------+----------+-------------------+ POP      Full           Yes      Yes                                      +---------+---------------+---------+-----------+----------+-------------------+ PTV      Full                                                             +---------+---------------+---------+-----------+----------+-------------------+ PERO     Full                                                             +---------+---------------+---------+-----------+----------+-------------------+ GSV      Full                                                             +---------+---------------+---------+-----------+----------+-------------------+ >  5cm bakers cyst.  +---------+---------------+---------+-----------+----------+--------------+ LEFT     CompressibilityPhasicitySpontaneityPropertiesThrombus Aging +---------+---------------+---------+-----------+----------+--------------+ CFV      Full           Yes      Yes                                 +---------+---------------+---------+-----------+----------+--------------+ SFJ      Full                                                        +---------+---------------+---------+-----------+----------+--------------+ FV Prox  Full                                                        +---------+---------------+---------+-----------+----------+--------------+ FV Mid   Full                                                        +---------+---------------+---------+-----------+----------+--------------+ FV DistalFull                                                        +---------+---------------+---------+-----------+----------+--------------+ PFV      Full                                                        +---------+---------------+---------+-----------+----------+--------------+ POP      Full           Yes       Yes                                 +---------+---------------+---------+-----------+----------+--------------+ PTV      Full                                                        +---------+---------------+---------+-----------+----------+--------------+ PERO     Full                                                        +---------+---------------+---------+-----------+----------+--------------+ GSV      Full                                                        +---------+---------------+---------+-----------+----------+--------------+ >  3cm bakers cyst    Summary: RIGHT: - There is no evidence of deep vein thrombosis in the lower extremity.  - A cystic structure is found in the popliteal fossa. - Abnormal venous waveforms in the Right CFV, SFV and DFA, suggestive of AVF.  LEFT: - There is no evidence of deep vein thrombosis in the lower extremity.  - A cystic structure is found in the popliteal fossa.  *See table(s) above for measurements and observations. Electronically signed by Harold Barban MD on 08/27/2019 at 5:49:03 PM.    Final        Discharge Exam: Vitals:   08/28/19 1952 08/29/19 0402  BP: (!) 150/98 113/64  Pulse: 80 77  Resp: 20   Temp: (!) 97.5 F (36.4 C) (!) 97.5 F (36.4 C)  SpO2:  98%    General: Pt is alert, awake, not in acute distress Cardiovascular: Irreg rhythm, rate bradycardic. Telemetry personally reviewed which shows sinus rhythm with PACs. NOT A FIB, S1/S2 +, no edema Respiratory: CTA bilaterally, no wheezing, no rhonchi, no respiratory distress, no conversational dyspnea  Abdominal: Soft, NT, ND, bowel sounds + Extremities: no edema, no cyanosis Psych: Depressed mood and affect    The results of significant diagnostics from this hospitalization (including imaging, microbiology, ancillary and laboratory) are listed below for reference.     Microbiology: Recent Results (from the past 240 hour(s))  Blood Culture (routine x 2)      Status: None (Preliminary result)   Collection Time: 08/26/19  1:36 PM   Specimen: BLOOD RIGHT HAND  Result Value Ref Range Status   Specimen Description BLOOD RIGHT HAND  Final   Special Requests   Final    BOTTLES DRAWN AEROBIC AND ANAEROBIC Blood Culture results may not be optimal due to an inadequate volume of blood received in culture bottles   Culture   Final    NO GROWTH 2 DAYS Performed at [facility], [place] [address], [place], [place] [address]    Report Status PENDING  Incomplete  Urine culture     Status: None   Collection Time: 08/26/19  1:49 PM   Specimen: Urine, Clean Catch  Result Value Ref Range Status   Specimen Description URINE, CLEAN CATCH  Final   Special Requests NONE  Final   Culture   Final    NO GROWTH Performed at [facility], [place] [address] [place], [place] [address]    Report Status 08/27/2019 FINAL  Final  Blood Culture (routine x 2)     Status: None (Preliminary result)   Collection Time: 08/26/19  2:00 PM   Specimen: BLOOD  Result Value Ref Range Status   Specimen Description BLOOD LEFT ANTECUBITAL  Final   Special Requests   Final    BOTTLES DRAWN AEROBIC AND ANAEROBIC Blood Culture results may not be optimal due to an inadequate volume of blood received in culture bottles   Culture   Final    NO GROWTH 2 DAYS Performed at [facility], [place] [address] [place],  [address]    Report Status PENDING  Incomplete  Respiratory Panel by PCR     Status: None   Collection Time: 08/26/19  8:30 PM   Specimen: Flu Kit Nasopharyngeal Swab; Respiratory  Result Value Ref Range Status   Adenovirus NOT DETECTED NOT DETECTED Final   Coronavirus 229E NOT DETECTED NOT DETECTED Final    Comment: (NOTE) The Coronavirus on the Respiratory Panel, DOES NOT test for  the novel  Coronavirus (2019 nCoV)    Coronavirus HKU1 NOT DETECTED NOT DETECTED Final   Coronavirus NL63 NOT DETECTED NOT DETECTED Final   Coronavirus OC43 NOT DETECTED NOT  DETECTED Final   Metapneumovirus NOT DETECTED NOT DETECTED Final   Rhinovirus / Enterovirus NOT DETECTED NOT DETECTED Final   Influenza A NOT DETECTED NOT DETECTED Final   Influenza B NOT DETECTED NOT DETECTED Final   Parainfluenza Virus 1 NOT DETECTED NOT DETECTED Final   Parainfluenza Virus 2 NOT DETECTED NOT DETECTED Final   Parainfluenza Virus 3 NOT DETECTED NOT DETECTED Final   Parainfluenza Virus 4 NOT DETECTED NOT DETECTED Final   Respiratory Syncytial Virus NOT DETECTED NOT DETECTED Final   Bordetella pertussis NOT DETECTED NOT DETECTED Final   Chlamydophila pneumoniae NOT DETECTED NOT DETECTED Final   Mycoplasma pneumoniae NOT DETECTED NOT DETECTED Final    Comment: Performed at [facility], [place] [address] [place], [place] [address]     Labs: BNP (last 3 results) No results for input(s): BNP in the last 8760 hours. Basic Metabolic Panel: Recent Labs  Lab 08/26/19 1331 08/26/19 1711 08/27/19 0028 08/28/19 0249  NA 137  --  139 139  K 4.8  --  4.0 3.9  CL 105  --  105 106  CO2 24  --  22 22  GLUCOSE 109*  --  107* 144*  BUN 16  --  15 13  CREATININE 1.03  --  0.99 0.96  CALCIUM 8.3*  --  8.6* 8.9  MG  --  1.8  --   --    Liver Function Tests: Recent Labs  Lab 08/26/19 1331 08/27/19 0028  AST 22 13*  ALT 17 15  ALKPHOS 64 62  BILITOT 0.9 0.5  PROT 6.2* 6.1*  ALBUMIN 3.0* 3.1*   No results for input(s): LIPASE, AMYLASE in the last 168 hours. No results for input(s): AMMONIA in the last 168 hours. CBC: Recent Labs  Lab 08/26/19 1331 08/27/19 0028 08/28/19 0249 08/29/19 0317  WBC 6.3 6.0 6.0 9.0  NEUTROABS 4.9 4.4  --   --   HGB 10.2* 10.1* 10.2* 10.1*  HCT 32.9* 31.8* 32.3* 31.3*  MCV 82.0 80.1 79.6* 79.6*  PLT 339 321 359 380   Cardiac Enzymes: No results for input(s): CKTOTAL, CKMB, CKMBINDEX, TROPONINI in the last 168 hours. BNP: Invalid input(s): POCBNP CBG: No results for input(s): GLUCAP in the last 168 hours. D-Dimer Recent  Labs    08/26/19 1331  DDIMER >20.00*   Hgb A1c No results for input(s): HGBA1C in the last 72 hours. Lipid Profile No results for input(s): CHOL, HDL, LDLCALC, TRIG, CHOLHDL, LDLDIRECT in the last 72 hours. Thyroid function studies Recent Labs    08/26/19 1712  TSH 0.806   Anemia work up Recent Labs    08/27/19 0818  FERRITIN 745*  TIBC 178*  IRON 22*   Urinalysis    Component Value Date/Time   COLORURINE YELLOW 08/26/2019 1344   APPEARANCEUR CLEAR 08/26/2019 1344   LABSPEC 1.018 08/26/2019 1344   PHURINE 5.0 08/26/2019 1344   GLUCOSEU NEGATIVE 08/26/2019 1344   HGBUR NEGATIVE 08/26/2019 1344   BILIRUBINUR NEGATIVE 08/26/2019 1344   KETONESUR NEGATIVE 08/26/2019 1344   PROTEINUR NEGATIVE 08/26/2019 1344   UROBILINOGEN 0.2 03/06/2015 0928   NITRITE NEGATIVE 08/26/2019 1344   LEUKOCYTESUR NEGATIVE 08/26/2019 1344   Sepsis Labs Invalid input(s): PROCALCITONIN,  WBC,  LACTICIDVEN Microbiology Recent Results (from the past 240 hour(s))  Blood Culture (  routine x 2)     Status: None (Preliminary result)   Collection Time: 08/26/19  1:36 PM   Specimen: BLOOD RIGHT HAND  Result Value Ref Range Status   Specimen Description BLOOD RIGHT HAND  Final   Special Requests   Final    BOTTLES DRAWN AEROBIC AND ANAEROBIC Blood Culture results may not be optimal due to an inadequate volume of blood received in culture bottles   Culture   Final    NO GROWTH 2 DAYS Performed at [facility], [place] [address] [place], [place] [address]    Report Status PENDING  Incomplete  Urine culture     Status: None   Collection Time: 08/26/19  1:49 PM   Specimen: Urine, Clean Catch  Result Value Ref Range Status   Specimen Description URINE, CLEAN CATCH  Final   Special Requests NONE  Final   Culture   Final    NO GROWTH Performed at [facility], [place] [address], [place], [place] [address]    Report Status 08/27/2019 FINAL  Final  Blood Culture (routine x 2)     Status:  None (Preliminary result)   Collection Time: 08/26/19  2:00 PM   Specimen: BLOOD  Result Value Ref Range Status   Specimen Description BLOOD LEFT ANTECUBITAL  Final   Special Requests   Final    BOTTLES DRAWN AEROBIC AND ANAEROBIC Blood Culture results may not be optimal due to an inadequate volume of blood received in culture bottles   Culture   Final    NO GROWTH 2 DAYS Performed at [facility], [place] [address], [place], [place] [address]    Report Status PENDING  Incomplete  Respiratory Panel by PCR     Status: None   Collection Time: 08/26/19  8:30 PM   Specimen: Flu Kit Nasopharyngeal Swab; Respiratory  Result Value Ref Range Status   Adenovirus NOT DETECTED NOT DETECTED Final   Coronavirus 229E NOT DETECTED NOT DETECTED Final    Comment: (NOTE) The Coronavirus on the Respiratory Panel, DOES NOT test for the novel  Coronavirus (2019 nCoV)    Coronavirus HKU1 NOT DETECTED NOT DETECTED Final   Coronavirus NL63 NOT DETECTED NOT DETECTED Final   Coronavirus OC43 NOT DETECTED NOT DETECTED Final   Metapneumovirus NOT DETECTED NOT DETECTED Final   Rhinovirus / Enterovirus NOT DETECTED NOT DETECTED Final   Influenza A NOT DETECTED NOT DETECTED Final   Influenza B NOT DETECTED NOT DETECTED Final   Parainfluenza Virus 1 NOT DETECTED NOT DETECTED Final   Parainfluenza Virus 2 NOT DETECTED NOT DETECTED Final   Parainfluenza Virus 3 NOT DETECTED NOT DETECTED Final   Parainfluenza Virus 4 NOT DETECTED NOT DETECTED Final   Respiratory Syncytial Virus NOT DETECTED NOT DETECTED Final   Bordetella pertussis NOT DETECTED NOT DETECTED Final   Chlamydophila pneumoniae NOT DETECTED NOT DETECTED Final   Mycoplasma pneumoniae NOT DETECTED NOT DETECTED Final    Comment: Performed at [facility], [place]. [address] [place], [place] [address]     Patient was seen and examined on the day of discharge and was found to be in stable condition. Time coordinating discharge: 25 minutes  including assessment and coordination of care, as well as examination of the patient.   SIGNED:  Dessa Phi, DO [facility] 08/29/2019, 8:39 AM

## 2019-08-30 LAB — FACTOR 5 LEIDEN

## 2019-08-31 LAB — CULTURE, BLOOD (ROUTINE X 2)
Culture: NO GROWTH
Culture: NO GROWTH

## 2019-09-01 DIAGNOSIS — F419 Anxiety disorder, unspecified: Secondary | ICD-10-CM | POA: Diagnosis not present

## 2019-09-01 DIAGNOSIS — Z7901 Long term (current) use of anticoagulants: Secondary | ICD-10-CM | POA: Diagnosis not present

## 2019-09-01 DIAGNOSIS — Z79891 Long term (current) use of opiate analgesic: Secondary | ICD-10-CM | POA: Diagnosis not present

## 2019-09-01 DIAGNOSIS — I471 Supraventricular tachycardia: Secondary | ICD-10-CM | POA: Diagnosis not present

## 2019-09-01 DIAGNOSIS — M810 Age-related osteoporosis without current pathological fracture: Secondary | ICD-10-CM | POA: Diagnosis not present

## 2019-09-01 DIAGNOSIS — M47812 Spondylosis without myelopathy or radiculopathy, cervical region: Secondary | ICD-10-CM | POA: Diagnosis not present

## 2019-09-01 DIAGNOSIS — M150 Primary generalized (osteo)arthritis: Secondary | ICD-10-CM | POA: Diagnosis not present

## 2019-09-01 DIAGNOSIS — C7889 Secondary malignant neoplasm of other digestive organs: Secondary | ICD-10-CM | POA: Diagnosis not present

## 2019-09-01 DIAGNOSIS — E785 Hyperlipidemia, unspecified: Secondary | ICD-10-CM | POA: Diagnosis not present

## 2019-09-01 DIAGNOSIS — C7651 Malignant neoplasm of right lower limb: Secondary | ICD-10-CM | POA: Diagnosis not present

## 2019-09-01 DIAGNOSIS — I2699 Other pulmonary embolism without acute cor pulmonale: Secondary | ICD-10-CM | POA: Diagnosis not present

## 2019-09-01 DIAGNOSIS — M069 Rheumatoid arthritis, unspecified: Secondary | ICD-10-CM | POA: Diagnosis not present

## 2019-09-01 DIAGNOSIS — I700 Atherosclerosis of aorta: Secondary | ICD-10-CM | POA: Diagnosis not present

## 2019-09-01 DIAGNOSIS — E2740 Unspecified adrenocortical insufficiency: Secondary | ICD-10-CM | POA: Diagnosis not present

## 2019-09-01 DIAGNOSIS — I051 Rheumatic mitral insufficiency: Secondary | ICD-10-CM | POA: Diagnosis not present

## 2019-09-01 DIAGNOSIS — Z8701 Personal history of pneumonia (recurrent): Secondary | ICD-10-CM | POA: Diagnosis not present

## 2019-09-01 DIAGNOSIS — J9811 Atelectasis: Secondary | ICD-10-CM | POA: Diagnosis not present

## 2019-09-01 DIAGNOSIS — D630 Anemia in neoplastic disease: Secondary | ICD-10-CM | POA: Diagnosis not present

## 2019-09-01 DIAGNOSIS — I959 Hypotension, unspecified: Secondary | ICD-10-CM | POA: Diagnosis not present

## 2019-09-01 DIAGNOSIS — Z87442 Personal history of urinary calculi: Secondary | ICD-10-CM | POA: Diagnosis not present

## 2019-09-01 DIAGNOSIS — M109 Gout, unspecified: Secondary | ICD-10-CM | POA: Diagnosis not present

## 2019-09-01 DIAGNOSIS — G4730 Sleep apnea, unspecified: Secondary | ICD-10-CM | POA: Diagnosis not present

## 2019-09-01 DIAGNOSIS — Z7952 Long term (current) use of systemic steroids: Secondary | ICD-10-CM | POA: Diagnosis not present

## 2019-09-01 DIAGNOSIS — Z9181 History of falling: Secondary | ICD-10-CM | POA: Diagnosis not present

## 2019-09-01 DIAGNOSIS — M7121 Synovial cyst of popliteal space [Baker], right knee: Secondary | ICD-10-CM | POA: Diagnosis not present

## 2019-09-02 LAB — PROTHROMBIN GENE MUTATION

## 2019-09-04 ENCOUNTER — Other Ambulatory Visit: Payer: Self-pay | Admitting: *Deleted

## 2019-09-04 ENCOUNTER — Telehealth: Payer: Self-pay | Admitting: *Deleted

## 2019-09-04 MED ORDER — PREDNISONE 20 MG PO TABS: 20.0000 mg | ORAL_TABLET | Freq: Every day | ORAL | 2 refills | Status: DC

## 2019-09-04 NOTE — Telephone Encounter (Signed)
Call received from [place] PT with [facility] stating that patient is having difficulty moving and doing therapy and would like to know if pt can be back on steroids again.  Dr. Marin Olp notified and would like for pt to start on Prednisone 20 mg daily. Call placed back to Lana to inform her of MD orders.  Lana requests that prescription be sent to [facility] in [place].  Prescription sent as requested.

## 2019-09-05 DIAGNOSIS — I051 Rheumatic mitral insufficiency: Secondary | ICD-10-CM | POA: Diagnosis not present

## 2019-09-05 DIAGNOSIS — Z7901 Long term (current) use of anticoagulants: Secondary | ICD-10-CM | POA: Diagnosis not present

## 2019-09-05 DIAGNOSIS — J9811 Atelectasis: Secondary | ICD-10-CM | POA: Diagnosis not present

## 2019-09-05 DIAGNOSIS — M47812 Spondylosis without myelopathy or radiculopathy, cervical region: Secondary | ICD-10-CM | POA: Diagnosis not present

## 2019-09-05 DIAGNOSIS — M109 Gout, unspecified: Secondary | ICD-10-CM | POA: Diagnosis not present

## 2019-09-05 DIAGNOSIS — C7889 Secondary malignant neoplasm of other digestive organs: Secondary | ICD-10-CM | POA: Diagnosis not present

## 2019-09-05 DIAGNOSIS — E785 Hyperlipidemia, unspecified: Secondary | ICD-10-CM | POA: Diagnosis not present

## 2019-09-05 DIAGNOSIS — G4730 Sleep apnea, unspecified: Secondary | ICD-10-CM | POA: Diagnosis not present

## 2019-09-05 DIAGNOSIS — C7651 Malignant neoplasm of right lower limb: Secondary | ICD-10-CM | POA: Diagnosis not present

## 2019-09-05 DIAGNOSIS — Z8701 Personal history of pneumonia (recurrent): Secondary | ICD-10-CM | POA: Diagnosis not present

## 2019-09-05 DIAGNOSIS — Z79891 Long term (current) use of opiate analgesic: Secondary | ICD-10-CM | POA: Diagnosis not present

## 2019-09-05 DIAGNOSIS — Z87442 Personal history of urinary calculi: Secondary | ICD-10-CM | POA: Diagnosis not present

## 2019-09-05 DIAGNOSIS — E2740 Unspecified adrenocortical insufficiency: Secondary | ICD-10-CM | POA: Diagnosis not present

## 2019-09-05 DIAGNOSIS — D630 Anemia in neoplastic disease: Secondary | ICD-10-CM | POA: Diagnosis not present

## 2019-09-05 DIAGNOSIS — M810 Age-related osteoporosis without current pathological fracture: Secondary | ICD-10-CM | POA: Diagnosis not present

## 2019-09-05 DIAGNOSIS — Z9181 History of falling: Secondary | ICD-10-CM | POA: Diagnosis not present

## 2019-09-05 DIAGNOSIS — I959 Hypotension, unspecified: Secondary | ICD-10-CM | POA: Diagnosis not present

## 2019-09-05 DIAGNOSIS — F419 Anxiety disorder, unspecified: Secondary | ICD-10-CM | POA: Diagnosis not present

## 2019-09-05 DIAGNOSIS — I471 Supraventricular tachycardia: Secondary | ICD-10-CM | POA: Diagnosis not present

## 2019-09-05 DIAGNOSIS — M150 Primary generalized (osteo)arthritis: Secondary | ICD-10-CM | POA: Diagnosis not present

## 2019-09-05 DIAGNOSIS — M7121 Synovial cyst of popliteal space [Baker], right knee: Secondary | ICD-10-CM | POA: Diagnosis not present

## 2019-09-05 DIAGNOSIS — M069 Rheumatoid arthritis, unspecified: Secondary | ICD-10-CM | POA: Diagnosis not present

## 2019-09-05 DIAGNOSIS — I700 Atherosclerosis of aorta: Secondary | ICD-10-CM | POA: Diagnosis not present

## 2019-09-05 DIAGNOSIS — I2699 Other pulmonary embolism without acute cor pulmonale: Secondary | ICD-10-CM | POA: Diagnosis not present

## 2019-09-05 DIAGNOSIS — Z7952 Long term (current) use of systemic steroids: Secondary | ICD-10-CM | POA: Diagnosis not present

## 2019-09-09 ENCOUNTER — Ambulatory Visit (HOSPITAL_COMMUNITY)
Admission: RE | Admit: 2019-09-09 | Discharge: 2019-09-09 | Disposition: A | Discharge status: Home / Self Care | Payer: PPO | Source: Ambulatory Visit | Attending: Hematology & Oncology | Admitting: Hematology & Oncology

## 2019-09-09 ENCOUNTER — Other Ambulatory Visit: Payer: Self-pay

## 2019-09-09 DIAGNOSIS — C4371 Malignant melanoma of right lower limb, including hip: Secondary | ICD-10-CM | POA: Diagnosis not present

## 2019-09-09 DIAGNOSIS — Z79899 Other long term (current) drug therapy: Secondary | ICD-10-CM | POA: Insufficient documentation

## 2019-09-09 LAB — GLUCOSE, CAPILLARY: Glucose-Capillary: 96 mg/dL (ref 70–99)

## 2019-09-09 MED ORDER — FLUDEOXYGLUCOSE F - 18 (FDG) INJECTION
12.0500 | Freq: Once | INTRAVENOUS | Status: AC
  Administered 2019-09-09: 12.05 via INTRAVENOUS

## 2019-09-11 ENCOUNTER — Other Ambulatory Visit: Payer: Self-pay

## 2019-09-11 ENCOUNTER — Other Ambulatory Visit: Payer: Self-pay | Admitting: *Deleted

## 2019-09-11 ENCOUNTER — Inpatient Hospital Stay (HOSPITAL_BASED_OUTPATIENT_CLINIC_OR_DEPARTMENT_OTHER): Payer: PPO | Admitting: Hematology & Oncology

## 2019-09-11 ENCOUNTER — Encounter: Payer: Self-pay | Admitting: Hematology & Oncology

## 2019-09-11 ENCOUNTER — Inpatient Hospital Stay: Payer: PPO

## 2019-09-11 ENCOUNTER — Inpatient Hospital Stay: Payer: PPO | Attending: Hematology & Oncology

## 2019-09-11 VITALS — BP 95/63 | HR 85 | Temp 97.0°F | Resp 20 | Wt 216.0 lb

## 2019-09-11 DIAGNOSIS — Z79899 Other long term (current) drug therapy: Secondary | ICD-10-CM | POA: Diagnosis not present

## 2019-09-11 DIAGNOSIS — C7A8 Other malignant neuroendocrine tumors: Secondary | ICD-10-CM

## 2019-09-11 DIAGNOSIS — C774 Secondary and unspecified malignant neoplasm of inguinal and lower limb lymph nodes: Secondary | ICD-10-CM | POA: Diagnosis not present

## 2019-09-11 DIAGNOSIS — C4371 Malignant melanoma of right lower limb, including hip: Secondary | ICD-10-CM

## 2019-09-11 LAB — CBC WITH DIFFERENTIAL (CANCER CENTER ONLY)
Abs Immature Granulocytes: 0.05 10*3/uL (ref 0.00–0.07)
Basophils Absolute: 0 10*3/uL (ref 0.0–0.1)
Basophils Relative: 0 %
Eosinophils Absolute: 0.1 10*3/uL (ref 0.0–0.5)
Eosinophils Relative: 2 %
HCT: 32.8 % — ABNORMAL LOW (ref 39.0–52.0)
Hemoglobin: 10.5 g/dL — ABNORMAL LOW (ref 13.0–17.0)
Immature Granulocytes: 1 %
Lymphocytes Relative: 8 %
Lymphs Abs: 0.7 10*3/uL (ref 0.7–4.0)
MCH: 26.1 pg (ref 26.0–34.0)
MCHC: 32 g/dL (ref 30.0–36.0)
MCV: 81.6 fL (ref 80.0–100.0)
Monocytes Absolute: 0.5 10*3/uL (ref 0.1–1.0)
Monocytes Relative: 6 %
Neutro Abs: 7 10*3/uL (ref 1.7–7.7)
Neutrophils Relative %: 83 %
Platelet Count: 202 10*3/uL (ref 150–400)
RBC: 4.02 MIL/uL — ABNORMAL LOW (ref 4.22–5.81)
RDW: 19.7 % — ABNORMAL HIGH (ref 11.5–15.5)
WBC Count: 8.3 10*3/uL (ref 4.0–10.5)
nRBC: 0 % (ref 0.0–0.2)

## 2019-09-11 LAB — CMP (CANCER CENTER ONLY)
ALT: 16 U/L (ref 0–44)
AST: 9 U/L — ABNORMAL LOW (ref 15–41)
Albumin: 3.6 g/dL (ref 3.5–5.0)
Alkaline Phosphatase: 58 U/L (ref 38–126)
Anion gap: 9 (ref 5–15)
BUN: 18 mg/dL (ref 6–20)
CO2: 23 mmol/L (ref 22–32)
Calcium: 8.5 mg/dL — ABNORMAL LOW (ref 8.9–10.3)
Chloride: 107 mmol/L (ref 98–111)
Creatinine: 0.84 mg/dL (ref 0.61–1.24)
GFR, Est AFR Am: >60 mL/min (ref >60)
GFR, Estimated: >60 mL/min (ref >60)
Glucose, Bld: 106 mg/dL — ABNORMAL HIGH (ref 70–99)
Potassium: 3.2 mmol/L — ABNORMAL LOW (ref 3.5–5.1)
Sodium: 139 mmol/L (ref 135–145)
Total Bilirubin: 0.4 mg/dL (ref 0.3–1.2)
Total Protein: 6.2 g/dL — ABNORMAL LOW (ref 6.5–8.1)

## 2019-09-11 LAB — IRON AND TIBC
Iron: 16 ug/dL — ABNORMAL LOW (ref 42–163)
Saturation Ratios: 9 % — ABNORMAL LOW (ref 20–55)
TIBC: 170 ug/dL — ABNORMAL LOW (ref 202–409)
UIBC: 154 ug/dL (ref 117–376)

## 2019-09-11 LAB — FERRITIN: Ferritin: 1015 ng/mL — ABNORMAL HIGH (ref 24–336)

## 2019-09-11 LAB — SAMPLE TO BLOOD BANK

## 2019-09-11 LAB — LACTATE DEHYDROGENASE: LDH: 175 U/L (ref 98–192)

## 2019-09-11 MED ORDER — HYDROCORTISONE 5 MG PO TABS: 5.0000 mg | ORAL_TABLET | Freq: Three times a day (TID) | ORAL | 1 refills | Status: DC

## 2019-09-11 NOTE — Addendum Note (Signed)
Addended by: Burney Gauze R on: 09/11/2019 04:50 PM   Modules accepted: Orders

## 2019-09-11 NOTE — Progress Notes (Signed)
Hematology and Oncology Follow Up Visit  Deagen Krass [id number] 1966/06/19 54 y.o. 09/11/2019   Principle Diagnosis:  Stage IIIB (T3bN1aM0) subungual melanoma of the right hallux -- nodal recurrence in the RIGHT inguinal nodes -- BRAF wt Neuroendocrine carcinoma of the pancreas  Current Therapy:   Yervoy-status post 3 cycles - discontinued in September 2016 secondary to toxicity  XRT to the RIGHT inguinal basin Nivolumab 400 mg q month -- s/p cycle #8 -start in 12/2018 - d/c on 08/2019 due to toxicity Somatuline 120 mg IM q month -- started on 05/22/2019 - d/c on 08/2019   Interim History:  Mr. Davtyan is here today for follow-up.  So far, he is actually doing quite well.  He was hospitalized a couple weeks ago.  He had hypotension.  It was felt that he also had a pulmonary embolism.  Thankfully, he got treated with IV heparin and then he has been on Lovenox.  His recent PET scan that he had done did not show any evidence of pulmonary embolism.  When the radiologist went back to look at the initial CT angiogram, it was not clear whether there was actual pulmonary emboli.  We did Dopplers of his legs and these were all negative.  He did have a PET scan done yesterday.  The PET scan, thankfully, did not seem to show any issue with respect to the melanoma.  There is no obvious melanoma that they could see.  He had a couple subcentimeter lymph nodes with minimal SUV activity.  There was nothing in the pancreas that looked suspicious.  Where he had some activity before, this appeared to be normal.  We are going to stop his treatments now.  His arthritis is his biggest problem.  I am not clear if the arthritis is a residual from his immunotherapy.  However, his quality of life has been greatly compromised by the arthritis.  He is on prednisone now and this is helping the arthritis.  There is been no problems with bleeding.  He has had no cough or shortness of breath.  His blood pressure still on  the low side.  I told him to try hydrocortisone 5 mg 3 times a day.  He currently is taking it twice a day.  He has had no change in bowel or bladder habits.  He has had no headache.  The very good news is that he and his wife are expecting another grandchild in October.  This really makes him motivated to get better.  Overall, I would say his performance status is probably ECOG 2.   Allergies: No Known Allergies  Past Medical History, Surgical history, Social history, and Family History were reviewed and updated.  Review of Systems: Review of Systems  Constitutional: Positive for malaise/fatigue.  HENT: Negative.   Eyes: Negative.   Respiratory: Positive for shortness of breath.   Cardiovascular: Negative.   Gastrointestinal: Positive for constipation and nausea.  Genitourinary: Negative.   Musculoskeletal: Positive for joint pain and myalgias.  Skin: Negative.   Neurological: Positive for tingling and focal weakness.  Endo/Heme/Allergies: Negative.   Psychiatric/Behavioral: Positive for memory loss.     Physical Exam:  weight is 216 lb (98 kg). His temporal temperature is 97 F (36.1 C) (abnormal). His blood pressure is 95/63 and his pulse is 85. His respiration is 20 and oxygen saturation is 97%.   Wt Readings from Last 3 Encounters:  09/11/19 216 lb (98 kg)  08/26/19 220 lb 0.3 oz (99.8 kg)  08/13/19 220 lb (99.8 kg)    Physical Exam Vitals reviewed.  HENT:     Head: Normocephalic and atraumatic.  Eyes:     Pupils: Pupils are equal, round, and reactive to light.  Cardiovascular:     Rate and Rhythm: Normal rate and regular rhythm.     Heart sounds: Normal heart sounds.  Pulmonary:     Effort: Pulmonary effort is normal.     Breath sounds: Normal breath sounds.  Abdominal:     General: Bowel sounds are normal.     Palpations: Abdomen is soft.     Comments: Abdominal exam shows a soft abdomen.  Bowel sounds are present.  There is no palpable abdominal mass.   There is no palpable liver or spleen tip.  He has a healing right inguinal lymphadenectomy scar.  I cannot palpate any obvious adenopathy.  The left inguinal area is unremarkable.    Musculoskeletal:        General: No tenderness or deformity. Normal range of motion.     Cervical back: Normal range of motion.     Comments: The big toe on the right foot is amputated.  There is no lymphedema in the right leg.  He has good pulses in his distal extremities.  Left leg is unremarkable.  Lymphadenopathy:     Cervical: No cervical adenopathy.  Skin:    General: Skin is warm and dry.     Findings: No erythema or rash.  Neurological:     Mental Status: He is alert and oriented to person, place, and time.  Psychiatric:        Behavior: Behavior normal.        Thought Content: Thought content normal.        Judgment: Judgment normal.      Lab Results  Component Value Date   WBC 8.3 09/11/2019   HGB 10.5 (L) 09/11/2019   HCT 32.8 (L) 09/11/2019   MCV 81.6 09/11/2019   PLT 202 09/11/2019   Lab Results  Component Value Date   FERRITIN 745 (H) 08/27/2019   IRON 22 (L) 08/27/2019   TIBC 178 (L) 08/27/2019   UIBC 156 08/27/2019   IRONPCTSAT 12 (L) 08/27/2019   Lab Results  Component Value Date   RETICCTPCT 1.1 07/31/2019   RBC 4.02 (L) 09/11/2019   No results found for: KPAFRELGTCHN, LAMBDASER, KAPLAMBRATIO No results found for: Kandis Cocking, IGMSERUM No results found for: Odetta Pink, SPEI   Chemistry      Component Value Date/Time   NA 139 09/11/2019 0837   NA 145 06/06/2017 1316   NA 141 10/16/2015 1334   K 3.2 (L) 09/11/2019 0837   K 4.1 06/06/2017 1316   K 3.9 10/16/2015 1334   CL 107 09/11/2019 0837   CL 108 06/06/2017 1316   CO2 23 09/11/2019 0837   CO2 26 06/06/2017 1316   CO2 21 (L) 10/16/2015 1334   BUN 18 09/11/2019 0837   BUN 19 06/06/2017 1316   BUN 20.0 10/16/2015 1334   CREATININE 0.84 09/11/2019 0837    CREATININE 1.0 06/06/2017 1316   CREATININE 1.0 10/16/2015 1334      Component Value Date/Time   CALCIUM 8.5 (L) 09/11/2019 0837   CALCIUM 9.4 06/06/2017 1316   CALCIUM 9.7 10/16/2015 1334   ALKPHOS 58 09/11/2019 0837   ALKPHOS 78 06/06/2017 1316   ALKPHOS 62 10/16/2015 1334   AST 9 (L) 09/11/2019 0837   AST 16 10/16/2015  1334   ALT 16 09/11/2019 0837   ALT 27 06/06/2017 1316   ALT 11 10/16/2015 1334   BILITOT 0.4 09/11/2019 0837   BILITOT 0.49 10/16/2015 1334      Impression and Plan: Mr. Nuon is a very pleasant 54 yo caucasian gentleman with history of stage IIIB melanoma of the right hallux that was subungual with one microscopic positive inguinal lymph node. He completed 3 cycles of Yervoy but stopped due to side effects.   I realize that this is a very complicated situation that we are dealing with.  I know that Mr. Lingelbach has just had difficulties with respect to immunotherapy.    Hopefully, he will improve with his performance status now that he is off all treatment.  Have to believe that he will do well off treatment.  We will plan to follow him monthly.  I will plan to do another scan on him probably in May.  We have to watch him closely as he is definitely at significant risk for recurrence of his melanoma.  I just feel bad that he is having all the other problems.  I forgot to mention that he did have iron while in the hospital.  His hemoglobin is a little bit better.  We will see what his iron level is now.  [name], MD 3/24/202110:17 AM

## 2019-09-12 LAB — CHROMOGRANIN A: Chromogranin A (ng/mL): 63.6 ng/mL (ref 0.0–101.8)

## 2019-09-18 ENCOUNTER — Inpatient Hospital Stay: Payer: PPO

## 2019-09-18 ENCOUNTER — Other Ambulatory Visit: Payer: Self-pay

## 2019-09-18 VITALS — BP 137/81 | HR 64 | Temp 97.3°F | Resp 18 | Ht 72.0 in | Wt 221.0 lb

## 2019-09-18 DIAGNOSIS — C4371 Malignant melanoma of right lower limb, including hip: Secondary | ICD-10-CM | POA: Diagnosis not present

## 2019-09-18 DIAGNOSIS — I9589 Other hypotension: Secondary | ICD-10-CM | POA: Diagnosis not present

## 2019-09-18 DIAGNOSIS — C7A8 Other malignant neuroendocrine tumors: Secondary | ICD-10-CM

## 2019-09-18 DIAGNOSIS — E782 Mixed hyperlipidemia: Secondary | ICD-10-CM | POA: Diagnosis not present

## 2019-09-18 MED ORDER — SODIUM CHLORIDE 0.9 % IV SOLN
1975.0000 mg | Freq: Once | INTRAVENOUS | Status: AC
  Administered 2019-09-18: 1975 mg via INTRAVENOUS
  Filled 2019-09-18: qty 39.5

## 2019-09-18 MED ORDER — SODIUM CHLORIDE 0.9 % IV SOLN
25.0000 mg | Freq: Once | INTRAVENOUS | Status: AC
  Administered 2019-09-18: 25 mg via INTRAVENOUS
  Filled 2019-09-18: qty 0.5

## 2019-09-18 NOTE — Patient Instructions (Signed)
Iron Dextran injection What is this medicine? IRON DEXTRAN (AHY ern DEX tran) is an iron complex. Iron is used to make healthy red blood cells, which carry oxygen and nutrients through the body. This medicine is used to treat people who cannot take iron by mouth and have low levels of iron in the blood. This medicine may be used for other purposes; ask your health care provider or pharmacist if you have questions. COMMON BRAND NAME(S): Dexferrum, INFeD What should I tell my health care provider before I take this medicine? They need to know if you have any of these conditions:  anemia not caused by low iron levels  heart disease  high levels of iron in the blood  kidney disease  liver disease  an unusual or allergic reaction to iron, other medicines, foods, dyes, or preservatives  pregnant or trying to get pregnant  breast-feeding How should I use this medicine? This medicine is for injection into a vein or a muscle. It is given by a health care professional in a hospital or clinic setting. Talk to your pediatrician regarding the use of this medicine in children. While this drug may be prescribed for children as young as 4 months old for selected conditions, precautions do apply. Overdosage: If you think you have taken too much of this medicine contact a poison control center or emergency room at once. NOTE: This medicine is only for you. Do not share this medicine with others. What if I miss a dose? It is important not to miss your dose. Call your doctor or health care professional if you are unable to keep an appointment. What may interact with this medicine? Do not take this medicine with any of the following medications:  deferoxamine  dimercaprol  other iron products This medicine may also interact with the following medications:  chloramphenicol  deferasirox This list may not describe all possible interactions. Give your health care provider a list of all the  medicines, herbs, non-prescription drugs, or dietary supplements you use. Also tell them if you smoke, drink alcohol, or use illegal drugs. Some items may interact with your medicine. What should I watch for while using this medicine? Visit your doctor or health care professional regularly. Tell your doctor if your symptoms do not start to get better or if they get worse. You may need blood work done while you are taking this medicine. You may need to follow a special diet. Talk to your doctor. Foods that contain iron include: whole grains/cereals, dried fruits, beans, or peas, leafy green vegetables, and organ meats (liver, kidney). Long-term use of this medicine may increase your risk of some cancers. Talk to your doctor about how to limit your risk. What side effects may I notice from receiving this medicine? Side effects that you should report to your doctor or health care professional as soon as possible:  allergic reactions like skin rash, itching or hives, swelling of the face, lips, or tongue  blue lips, nails, or skin  breathing problems  changes in blood pressure  chest pain  confusion  fast, irregular heartbeat  feeling faint or lightheaded, falls  fever or chills  flushing, sweating, or hot feelings  joint or muscle aches or pains  pain, tingling, numbness in the hands or feet  seizures  unusually weak or tired Side effects that usually do not require medical attention (report to your doctor or health care professional if they continue or are bothersome):  change in taste (metallic taste)    diarrhea  headache  irritation at site where injected  nausea, vomiting  stomach upset This list may not describe all possible side effects. Call your doctor for medical advice about side effects. You may report side effects to FDA at 1-800-FDA-1088. Where should I keep my medicine? This drug is given in a hospital or clinic and will not be stored at home. NOTE: This  sheet is a summary. It may not cover all possible information. If you have questions about this medicine, talk to your doctor, pharmacist, or health care provider.  2020 Elsevier/Gold Standard (2007-10-23 16:59:50)  

## 2019-09-19 DIAGNOSIS — Z87442 Personal history of urinary calculi: Secondary | ICD-10-CM | POA: Diagnosis not present

## 2019-09-19 DIAGNOSIS — Z9181 History of falling: Secondary | ICD-10-CM | POA: Diagnosis not present

## 2019-09-19 DIAGNOSIS — J9811 Atelectasis: Secondary | ICD-10-CM | POA: Diagnosis not present

## 2019-09-19 DIAGNOSIS — Z79891 Long term (current) use of opiate analgesic: Secondary | ICD-10-CM | POA: Diagnosis not present

## 2019-09-19 DIAGNOSIS — M47812 Spondylosis without myelopathy or radiculopathy, cervical region: Secondary | ICD-10-CM | POA: Diagnosis not present

## 2019-09-19 DIAGNOSIS — M150 Primary generalized (osteo)arthritis: Secondary | ICD-10-CM | POA: Diagnosis not present

## 2019-09-19 DIAGNOSIS — I471 Supraventricular tachycardia: Secondary | ICD-10-CM | POA: Diagnosis not present

## 2019-09-19 DIAGNOSIS — E2740 Unspecified adrenocortical insufficiency: Secondary | ICD-10-CM | POA: Diagnosis not present

## 2019-09-19 DIAGNOSIS — I959 Hypotension, unspecified: Secondary | ICD-10-CM | POA: Diagnosis not present

## 2019-09-19 DIAGNOSIS — C7651 Malignant neoplasm of right lower limb: Secondary | ICD-10-CM | POA: Diagnosis not present

## 2019-09-19 DIAGNOSIS — M109 Gout, unspecified: Secondary | ICD-10-CM | POA: Diagnosis not present

## 2019-09-19 DIAGNOSIS — I051 Rheumatic mitral insufficiency: Secondary | ICD-10-CM | POA: Diagnosis not present

## 2019-09-19 DIAGNOSIS — M810 Age-related osteoporosis without current pathological fracture: Secondary | ICD-10-CM | POA: Diagnosis not present

## 2019-09-19 DIAGNOSIS — Z8701 Personal history of pneumonia (recurrent): Secondary | ICD-10-CM | POA: Diagnosis not present

## 2019-09-19 DIAGNOSIS — E785 Hyperlipidemia, unspecified: Secondary | ICD-10-CM | POA: Diagnosis not present

## 2019-09-19 DIAGNOSIS — I700 Atherosclerosis of aorta: Secondary | ICD-10-CM | POA: Diagnosis not present

## 2019-09-19 DIAGNOSIS — F419 Anxiety disorder, unspecified: Secondary | ICD-10-CM | POA: Diagnosis not present

## 2019-09-19 DIAGNOSIS — Z7952 Long term (current) use of systemic steroids: Secondary | ICD-10-CM | POA: Diagnosis not present

## 2019-09-19 DIAGNOSIS — D630 Anemia in neoplastic disease: Secondary | ICD-10-CM | POA: Diagnosis not present

## 2019-09-19 DIAGNOSIS — M069 Rheumatoid arthritis, unspecified: Secondary | ICD-10-CM | POA: Diagnosis not present

## 2019-09-19 DIAGNOSIS — M7121 Synovial cyst of popliteal space [Baker], right knee: Secondary | ICD-10-CM | POA: Diagnosis not present

## 2019-09-19 DIAGNOSIS — G4730 Sleep apnea, unspecified: Secondary | ICD-10-CM | POA: Diagnosis not present

## 2019-09-19 DIAGNOSIS — C7889 Secondary malignant neoplasm of other digestive organs: Secondary | ICD-10-CM | POA: Diagnosis not present

## 2019-09-19 DIAGNOSIS — I2699 Other pulmonary embolism without acute cor pulmonale: Secondary | ICD-10-CM | POA: Diagnosis not present

## 2019-09-19 DIAGNOSIS — Z7901 Long term (current) use of anticoagulants: Secondary | ICD-10-CM | POA: Diagnosis not present

## 2019-10-01 ENCOUNTER — Other Ambulatory Visit: Payer: Self-pay | Admitting: Hematology & Oncology

## 2019-10-07 DIAGNOSIS — I2699 Other pulmonary embolism without acute cor pulmonale: Secondary | ICD-10-CM | POA: Diagnosis not present

## 2019-10-07 DIAGNOSIS — Z8701 Personal history of pneumonia (recurrent): Secondary | ICD-10-CM | POA: Diagnosis not present

## 2019-10-07 DIAGNOSIS — E2740 Unspecified adrenocortical insufficiency: Secondary | ICD-10-CM | POA: Diagnosis not present

## 2019-10-07 DIAGNOSIS — M150 Primary generalized (osteo)arthritis: Secondary | ICD-10-CM | POA: Diagnosis not present

## 2019-10-07 DIAGNOSIS — C7651 Malignant neoplasm of right lower limb: Secondary | ICD-10-CM | POA: Diagnosis not present

## 2019-10-07 DIAGNOSIS — M069 Rheumatoid arthritis, unspecified: Secondary | ICD-10-CM | POA: Diagnosis not present

## 2019-10-07 DIAGNOSIS — E785 Hyperlipidemia, unspecified: Secondary | ICD-10-CM | POA: Diagnosis not present

## 2019-10-07 DIAGNOSIS — I700 Atherosclerosis of aorta: Secondary | ICD-10-CM | POA: Diagnosis not present

## 2019-10-07 DIAGNOSIS — Z7952 Long term (current) use of systemic steroids: Secondary | ICD-10-CM | POA: Diagnosis not present

## 2019-10-07 DIAGNOSIS — C7889 Secondary malignant neoplasm of other digestive organs: Secondary | ICD-10-CM | POA: Diagnosis not present

## 2019-10-07 DIAGNOSIS — I471 Supraventricular tachycardia: Secondary | ICD-10-CM | POA: Diagnosis not present

## 2019-10-07 DIAGNOSIS — Z79891 Long term (current) use of opiate analgesic: Secondary | ICD-10-CM | POA: Diagnosis not present

## 2019-10-07 DIAGNOSIS — Z7901 Long term (current) use of anticoagulants: Secondary | ICD-10-CM | POA: Diagnosis not present

## 2019-10-07 DIAGNOSIS — I051 Rheumatic mitral insufficiency: Secondary | ICD-10-CM | POA: Diagnosis not present

## 2019-10-07 DIAGNOSIS — M47812 Spondylosis without myelopathy or radiculopathy, cervical region: Secondary | ICD-10-CM | POA: Diagnosis not present

## 2019-10-07 DIAGNOSIS — M810 Age-related osteoporosis without current pathological fracture: Secondary | ICD-10-CM | POA: Diagnosis not present

## 2019-10-07 DIAGNOSIS — M7121 Synovial cyst of popliteal space [Baker], right knee: Secondary | ICD-10-CM | POA: Diagnosis not present

## 2019-10-07 DIAGNOSIS — J9811 Atelectasis: Secondary | ICD-10-CM | POA: Diagnosis not present

## 2019-10-07 DIAGNOSIS — Z87442 Personal history of urinary calculi: Secondary | ICD-10-CM | POA: Diagnosis not present

## 2019-10-07 DIAGNOSIS — G4730 Sleep apnea, unspecified: Secondary | ICD-10-CM | POA: Diagnosis not present

## 2019-10-07 DIAGNOSIS — D630 Anemia in neoplastic disease: Secondary | ICD-10-CM | POA: Diagnosis not present

## 2019-10-07 DIAGNOSIS — M109 Gout, unspecified: Secondary | ICD-10-CM | POA: Diagnosis not present

## 2019-10-07 DIAGNOSIS — F419 Anxiety disorder, unspecified: Secondary | ICD-10-CM | POA: Diagnosis not present

## 2019-10-07 DIAGNOSIS — I959 Hypotension, unspecified: Secondary | ICD-10-CM | POA: Diagnosis not present

## 2019-10-07 DIAGNOSIS — Z9181 History of falling: Secondary | ICD-10-CM | POA: Diagnosis not present

## 2019-10-10 ENCOUNTER — Inpatient Hospital Stay (HOSPITAL_BASED_OUTPATIENT_CLINIC_OR_DEPARTMENT_OTHER): Payer: PPO | Admitting: Hematology & Oncology

## 2019-10-10 ENCOUNTER — Inpatient Hospital Stay: Payer: PPO | Attending: Hematology & Oncology

## 2019-10-10 ENCOUNTER — Encounter: Payer: Self-pay | Admitting: Hematology & Oncology

## 2019-10-10 ENCOUNTER — Inpatient Hospital Stay: Payer: PPO | Admitting: Family

## 2019-10-10 ENCOUNTER — Other Ambulatory Visit: Payer: Self-pay

## 2019-10-10 VITALS — BP 144/94 | HR 70 | Temp 97.8°F | Resp 18 | Wt 227.0 lb

## 2019-10-10 DIAGNOSIS — C4371 Malignant melanoma of right lower limb, including hip: Secondary | ICD-10-CM

## 2019-10-10 DIAGNOSIS — D509 Iron deficiency anemia, unspecified: Secondary | ICD-10-CM | POA: Insufficient documentation

## 2019-10-10 DIAGNOSIS — Z9221 Personal history of antineoplastic chemotherapy: Secondary | ICD-10-CM | POA: Insufficient documentation

## 2019-10-10 DIAGNOSIS — Z79899 Other long term (current) drug therapy: Secondary | ICD-10-CM | POA: Insufficient documentation

## 2019-10-10 DIAGNOSIS — C7A8 Other malignant neuroendocrine tumors: Secondary | ICD-10-CM

## 2019-10-10 DIAGNOSIS — C7A098 Malignant carcinoid tumors of other sites: Secondary | ICD-10-CM | POA: Diagnosis not present

## 2019-10-10 LAB — CMP (CANCER CENTER ONLY)
ALT: 22 U/L (ref 0–44)
AST: 15 U/L (ref 15–41)
Albumin: 4.3 g/dL (ref 3.5–5.0)
Alkaline Phosphatase: 69 U/L (ref 38–126)
Anion gap: 7 (ref 5–15)
BUN: 23 mg/dL — ABNORMAL HIGH (ref 6–20)
CO2: 27 mmol/L (ref 22–32)
Calcium: 9.7 mg/dL (ref 8.9–10.3)
Chloride: 105 mmol/L (ref 98–111)
Creatinine: 1 mg/dL (ref 0.61–1.24)
GFR, Est AFR Am: >60 mL/min (ref >60)
GFR, Estimated: >60 mL/min (ref >60)
Glucose, Bld: 98 mg/dL (ref 70–99)
Potassium: 4.3 mmol/L (ref 3.5–5.1)
Sodium: 139 mmol/L (ref 135–145)
Total Bilirubin: 0.5 mg/dL (ref 0.3–1.2)
Total Protein: 7 g/dL (ref 6.5–8.1)

## 2019-10-10 LAB — CBC WITH DIFFERENTIAL (CANCER CENTER ONLY)
Abs Immature Granulocytes: 0.05 10*3/uL (ref 0.00–0.07)
Basophils Absolute: 0 10*3/uL (ref 0.0–0.1)
Basophils Relative: 0 %
Eosinophils Absolute: 0 10*3/uL (ref 0.0–0.5)
Eosinophils Relative: 0 %
HCT: 36.9 % — ABNORMAL LOW (ref 39.0–52.0)
Hemoglobin: 12.2 g/dL — ABNORMAL LOW (ref 13.0–17.0)
Immature Granulocytes: 1 %
Lymphocytes Relative: 5 %
Lymphs Abs: 0.6 10*3/uL — ABNORMAL LOW (ref 0.7–4.0)
MCH: 28.2 pg (ref 26.0–34.0)
MCHC: 33.1 g/dL (ref 30.0–36.0)
MCV: 85.2 fL (ref 80.0–100.0)
Monocytes Absolute: 0.4 10*3/uL (ref 0.1–1.0)
Monocytes Relative: 4 %
Neutro Abs: 9.1 10*3/uL — ABNORMAL HIGH (ref 1.7–7.7)
Neutrophils Relative %: 90 %
Platelet Count: 207 10*3/uL (ref 150–400)
RBC: 4.33 MIL/uL (ref 4.22–5.81)
RDW: 20.4 % — ABNORMAL HIGH (ref 11.5–15.5)
WBC Count: 10.1 10*3/uL (ref 4.0–10.5)
nRBC: 0 % (ref 0.0–0.2)

## 2019-10-10 LAB — IRON AND TIBC
Iron: 133 ug/dL (ref 42–163)
Saturation Ratios: 69 % — ABNORMAL HIGH (ref 20–55)
TIBC: 194 ug/dL — ABNORMAL LOW (ref 202–409)
UIBC: 61 ug/dL — ABNORMAL LOW (ref 117–376)

## 2019-10-10 LAB — FERRITIN: Ferritin: 1737 ng/mL — ABNORMAL HIGH (ref 24–336)

## 2019-10-10 LAB — LACTATE DEHYDROGENASE: LDH: 178 U/L (ref 98–192)

## 2019-10-10 NOTE — Progress Notes (Signed)
Hematology and Oncology Follow Up Visit  James Byrd [id number] 10/25/1965 54 y.o. 10/10/2019   Principle Diagnosis:  Stage IIIB (T3bN1aM0) subungual melanoma of the right hallux -- nodal recurrence in the RIGHT inguinal nodes -- BRAF wt Neuroendocrine carcinoma of the pancreas Iron def anemia  Current Therapy:   Yervoy-status post 3 cycles - discontinued in September 2016 secondary to toxicity  XRT to the RIGHT inguinal basin Nivolumab 400 mg q month -- s/p cycle #8 -start in 12/2018 - d/c on 08/2019 due to toxicity Somatuline 120 mg IM q month -- started on 05/22/2019 - d/c on 08/2019 IV Iron -- Iron Dextran given on 09/18/2019   Interim History:  James Byrd is here today for follow-up. I must say that this is best of seen work in several months. Clearly, stop the immunotherapy and the Somatuline have done more for him then continue him on these.  I realize that we have not completed a year of therapy but I do think that he really was having difficulty.  His arthritis is much better now. He is able to do things now. He is able to work around the house. He is much happier now.  His iron is now replaced. His hemoglobin is back up. This is also a true miracle. I know that James Byrd faith has rewarded him.  He has had no abdominal pain. He has had no diarrhea. He has had no cough.  He is not on blood thinner any longer. The radiologist after having a second look at the CT angiogram did not feel that there was an actual pulmonary embolism.  There is been no issues with his bowels or bladder. He has had no leg swelling.  He has had no headache.  Overall, his performance status right now is ECOG 1.   Allergies: No Known Allergies  Past Medical History, Surgical history, Social history, and Family History were reviewed and updated.  Review of Systems: Review of Systems  Constitutional: Positive for malaise/fatigue.  HENT: Negative.   Eyes: Negative.   Respiratory:  Positive for shortness of breath.   Cardiovascular: Negative.   Gastrointestinal: Positive for constipation and nausea.  Genitourinary: Negative.   Musculoskeletal: Positive for joint pain and myalgias.  Skin: Negative.   Neurological: Positive for tingling and focal weakness.  Endo/Heme/Allergies: Negative.   Psychiatric/Behavioral: Positive for memory loss.     Physical Exam:  weight is 227 lb (103 kg). His temporal temperature is 97.8 F (36.6 C). His blood pressure is 144/94 (abnormal) and his pulse is 70. His respiration is 18 and oxygen saturation is 98%.   Wt Readings from Last 3 Encounters:  10/10/19 227 lb (103 kg)  09/18/19 221 lb (100.2 kg)  09/11/19 216 lb (98 kg)    Physical Exam Vitals reviewed.  HENT:     Head: Normocephalic and atraumatic.  Eyes:     Pupils: Pupils are equal, round, and reactive to light.  Cardiovascular:     Rate and Rhythm: Normal rate and regular rhythm.     Heart sounds: Normal heart sounds.  Pulmonary:     Effort: Pulmonary effort is normal.     Breath sounds: Normal breath sounds.  Abdominal:     General: Bowel sounds are normal.     Palpations: Abdomen is soft.     Comments: Abdominal exam shows a soft abdomen.  Bowel sounds are present.  There is no palpable abdominal mass.  There is no palpable liver or spleen tip.  He has  a healing right inguinal lymphadenectomy scar.  I cannot palpate any obvious adenopathy.  The left inguinal area is unremarkable.    Musculoskeletal:        General: No tenderness or deformity. Normal range of motion.     Cervical back: Normal range of motion.     Comments: The big toe on the right foot is amputated.  There is no lymphedema in the right leg.  He has good pulses in his distal extremities.  Left leg is unremarkable.  Lymphadenopathy:     Cervical: No cervical adenopathy.  Skin:    General: Skin is warm and dry.     Findings: No erythema or rash.  Neurological:     Mental Status: He is alert and  oriented to person, place, and time.  Psychiatric:        Behavior: Behavior normal.        Thought Content: Thought content normal.        Judgment: Judgment normal.      Lab Results  Component Value Date   WBC 10.1 10/10/2019   HGB 12.2 (L) 10/10/2019   HCT 36.9 (L) 10/10/2019   MCV 85.2 10/10/2019   PLT 207 10/10/2019   Lab Results  Component Value Date   FERRITIN 1,015 (H) 09/11/2019   IRON 16 (L) 09/11/2019   TIBC 170 (L) 09/11/2019   UIBC 154 09/11/2019   IRONPCTSAT 9 (L) 09/11/2019   Lab Results  Component Value Date   RETICCTPCT 1.1 07/31/2019   RBC 4.33 10/10/2019   No results found for: KPAFRELGTCHN, LAMBDASER, KAPLAMBRATIO No results found for: IGGSERUM, IGA, IGMSERUM No results found for: Odetta Pink, SPEI   Chemistry      Component Value Date/Time   NA 139 10/10/2019 1030   NA 145 06/06/2017 1316   NA 141 10/16/2015 1334   K 4.3 10/10/2019 1030   K 4.1 06/06/2017 1316   K 3.9 10/16/2015 1334   CL 105 10/10/2019 1030   CL 108 06/06/2017 1316   CO2 27 10/10/2019 1030   CO2 26 06/06/2017 1316   CO2 21 (L) 10/16/2015 1334   BUN 23 (H) 10/10/2019 1030   BUN 19 06/06/2017 1316   BUN 20.0 10/16/2015 1334   CREATININE 1.00 10/10/2019 1030   CREATININE 1.0 06/06/2017 1316   CREATININE 1.0 10/16/2015 1334      Component Value Date/Time   CALCIUM 9.7 10/10/2019 1030   CALCIUM 9.4 06/06/2017 1316   CALCIUM 9.7 10/16/2015 1334   ALKPHOS 69 10/10/2019 1030   ALKPHOS 78 06/06/2017 1316   ALKPHOS 62 10/16/2015 1334   AST 15 10/10/2019 1030   AST 16 10/16/2015 1334   ALT 22 10/10/2019 1030   ALT 27 06/06/2017 1316   ALT 11 10/16/2015 1334   BILITOT 0.5 10/10/2019 1030   BILITOT 0.49 10/16/2015 1334      Impression and Plan: James Byrd is a very pleasant 54 yo caucasian gentleman with history of stage IIIB melanoma of the right hallux that was subungual with one microscopic positive inguinal lymph  node. He completed 3 cycles of Yervoy but stopped due to side effects.   For right now, we will just watch James Byrd. We will repeat a PET scan on him probably in June. I think this would be reasonable.  I am just happy that his quality of life is back to where he would like to be. He can now be an active participant in his  granddaughter's life. Now with the warmer months coming, he will be much more active. He wants to be active. He and his wife just got an aboveground swimming pool that they really look forward to using with the family.  I will plan to see him back after he has his PET scan done.  Again I have to believe that his iron levels are better given his hemoglobin and the fact that his MCV is coming up a little bit.   [name], MD 4/22/202111:45 AM

## 2019-10-11 ENCOUNTER — Encounter: Payer: Self-pay | Admitting: *Deleted

## 2019-10-11 LAB — CHROMOGRANIN A: Chromogranin A (ng/mL): 48.8 ng/mL (ref 0.0–101.8)

## 2019-10-14 DIAGNOSIS — J40 Bronchitis, not specified as acute or chronic: Secondary | ICD-10-CM | POA: Diagnosis not present

## 2019-10-14 DIAGNOSIS — J329 Chronic sinusitis, unspecified: Secondary | ICD-10-CM | POA: Diagnosis not present

## 2019-10-15 DIAGNOSIS — S90222A Contusion of left lesser toe(s) with damage to nail, initial encounter: Secondary | ICD-10-CM | POA: Diagnosis not present

## 2019-10-15 NOTE — Progress Notes (Signed)
Office Visit Note  Patient: James Byrd             Date of Birth: 02-07-1966           MRN: [id number]             PCP: Emmaline Kluver, MD Referring: [name], MD Visit Date: 10/17/2019 Occupation: @GUAROCC @  Subjective:  Pain and swelling in multiple   History of Present Illness: James Byrd is a 54 y.o. male seen in consultation per request of Dr. Marin Olp for joint pain and swelling. Patient states he was hospitalized about 3 months ago due to sepsis at the time he started developing increased joint pain and swelling. He states the pain was mostly in his shoulders, elbows, knees and ankles. Dr. Marin Olp obtain some blood work and his rheumatoid factor was low positive. He was placed on prednisone 20 mg p.o. daily. He states after starting on prednisone his joint symptoms had improved. He also has history of adrenal insufficiency for the last 5 years for which she has been taking hydrocortisone 5 mg p.o. daily. He has been having some discomfort in his knee joints currently. He has known history of stage III melanoma and neuroendocrine carcinoma of pancreas for which he has been followed by oncology. He was on chemotherapy for the last 8 months and stopped it about 2 months ago.  Activities of Daily Living:  Patient reports morning stiffness for 1 hour.   Patient Denies nocturnal pain.  Difficulty dressing/grooming: Denies Difficulty climbing stairs: Denies Difficulty getting out of chair: Denies Difficulty using hands for taps, buttons, cutlery, and/or writing: Reports  Review of Systems  Constitutional: Negative for fatigue and night sweats.  HENT: Negative for mouth sores, mouth dryness and nose dryness.   Eyes: Negative for redness and dryness.  Respiratory: Negative for shortness of breath and difficulty breathing.   Cardiovascular: Negative for chest pain, palpitations, hypertension, irregular heartbeat and swelling in legs/feet.  Gastrointestinal: Positive  for diarrhea. Negative for constipation.  Endocrine: Positive for increased urination.  Genitourinary: Negative for difficulty urinating.  Musculoskeletal: Positive for arthralgias, joint pain, joint swelling, muscle weakness and morning stiffness. Negative for myalgias, muscle tenderness and myalgias.  Skin: Positive for nodules/bumps. Negative for color change, rash, hair loss, skin tightness, ulcers and sensitivity to sunlight.  Allergic/Immunologic: Positive for susceptible to infections.  Neurological: Positive for weakness. Negative for dizziness, fainting, memory loss and night sweats.  Hematological: Positive for bruising/bleeding tendency. Negative for swollen glands.  Psychiatric/Behavioral: Positive for depressed mood. Negative for sleep disturbance. The patient is not nervous/anxious.     PMFS History:  Patient Active Problem List   Diagnosis Date Noted  . Pain in both feet 10/17/2019  . Bilateral pulmonary embolism ([place]) 08/26/2019  . Neuroendocrine carcinoma of pancreas ([place]) 05/22/2019  . Severe sepsis with septic shock ([place]) 01/15/2019  . Acute respiratory failure with hypoxia ([place]) 01/15/2019  . AKI (acute kidney injury) ([place]) 01/15/2019  . Septic shock ([place]) 01/13/2019  . Goals of care, counseling/discussion 12/17/2018  . Adrenal insufficiency ([place])   . Refractory nausea and vomiting   . Paranasal sinus disease   . Acute renal failure syndrome ([place])   . HCAP (healthcare-associated pneumonia) 03/06/2015  . Drug-induced pneumonitis - VERVOY   . Thrombocytopenia ([place]) 02/09/2015  . HLD (hyperlipidemia)   . Chronic diastolic congestive heart failure ([place])   . Malignant melanoma of right great toe Stage IIIB ([id number]) 11/14/2014  . Obstructive sleep apnea 07/19/2012  . Paroxysmal  supraventricular tachycardia ([place]) 05/24/2012  . Chest pressure 05/24/2012  . Palpitations 05/24/2012  . Anxiety 05/24/2012    Past Medical History:  Diagnosis Date  . Adrenal  insufficiency ([place])   . Anxiety 05/24/2012  . Complication of anesthesia    SLO TO AWAKEN MANY 8 TO 9 YRS AGO, NO ISSUES SINCE  . Goals of care, counseling/discussion 12/17/2018  . Gout   . History of kidney stones   . HLD (hyperlipidemia)   . Malignant melanoma of right great toe ([place]) 11/14/2014  . Neuroendocrine carcinoma of pancreas ([place]) 05/22/2019  . Paroxysmal supraventricular tachycardia ([place]) 05/24/2012   Described in the treadmill report; details are pending   . PE (pulmonary thromboembolism) ([place]) 08/2019  . Rheumatoid arthritis ([place])   . Sleep apnea    MILD NO CPAP NEEDED    Family History  Adopted: Yes  Problem Relation Age of Onset  . Stroke Mother   . COPD Father   . Diabetes Father   . Diabetes Sister   . Diabetes Brother    Past Surgical History:  Procedure Laterality Date  . CHOLECYSTECTOMY    . ESOPHAGOGASTRODUODENOSCOPY (EGD) WITH PROPOFOL N/A 05/13/2019   Procedure: ESOPHAGOGASTRODUODENOSCOPY (EGD) WITH PROPOFOL;  Surgeon: Arta Silence, MD;  Location: [facility];  Service: Endoscopy;  Laterality: N/A;  . EXTRACORPOREAL SHOCK WAVE LITHOTRIPSY  2016  . FINE NEEDLE ASPIRATION N/A 05/13/2019   Procedure: FINE NEEDLE ASPIRATION (FNA) LINEAR;  Surgeon: Arta Silence, MD;  Location: [facility];  Service: Endoscopy;  Laterality: N/A;  . LYMPH NODE BIOPSY Right 11/06/2018   Procedure: RIGHT INGUINAL LYMPH NODE BIOPSY AND POSSIBLE RIGHT GROIN NODE DISECTION;  Surgeon: Alphonsa Overall, MD;  Location: [facility];  Service: General;  Laterality: Right;  . r toe partial ambutation    . ROTATOR CUFF REPAIR Right 2010   3 yrs ago  . UPPER ESOPHAGEAL ENDOSCOPIC ULTRASOUND (EUS) N/A 05/13/2019   Procedure: UPPER ESOPHAGEAL ENDOSCOPIC ULTRASOUND (EUS);  Surgeon: Arta Silence, MD;  Location: [facility];  Service: Endoscopy;  Laterality: N/A;   Social History   Social History Narrative   Lives with wife in a one story home.  Has 2 children.  On disability.  Education:  high school.+   Immunization History  Administered Date(s) Administered  . Influenza-Unspecified 04/03/2014     Objective: Vital Signs: BP 123/76 (BP Location: Right Arm, Patient Position: Sitting, Cuff Size: Normal)   Pulse 93   Resp 16   Ht 5' 11"  (1.803 m)   Wt 230 lb (104.3 kg)   BMI 32.08 kg/m    Physical Exam Vitals and nursing note reviewed.  Constitutional:      Appearance: He is well-developed.  HENT:     Head: Normocephalic and atraumatic.  Eyes:     Conjunctiva/sclera: Conjunctivae normal.     Pupils: Pupils are equal, round, and reactive to light.  Cardiovascular:     Rate and Rhythm: Normal rate and regular rhythm.     Heart sounds: Normal heart sounds.  Pulmonary:     Effort: Pulmonary effort is normal.     Breath sounds: Normal breath sounds.  Abdominal:     General: Bowel sounds are normal.     Palpations: Abdomen is soft.  Musculoskeletal:     Cervical back: Normal range of motion and neck supple.  Skin:    General: Skin is warm and dry.     Capillary Refill: Capillary refill takes less than 2 seconds.  Neurological:     Mental Status:  He is alert and oriented to person, place, and time.  Psychiatric:        Behavior: Behavior normal.      Musculoskeletal Exam: He had good range of motion of the cervical thoracic and lumbar spine.  He had discomfort range of motion of bilateral shoulder joints.  He has swelling and contracture of his right elbow.  He has synovitis over his left wrist joint.  He has warmth and swelling in his bilateral knees and ankles.  CDAI Exam: CDAI Score: 12.4  Patient Global: 6 mm; Provider Global: 8 mm Swollen: 6 ; Tender: 9  Joint Exam 10/17/2019      Right  Left  Glenohumeral   Tender   Tender  Elbow  Swollen Tender     Wrist   Tender  Swollen Tender  Knee  Swollen Tender  Swollen Tender  Ankle  Swollen Tender  Swollen Tender     Investigation: No additional findings.  Imaging: No results found.  Recent  Labs: Lab Results  Component Value Date   WBC 10.1 10/10/2019   HGB 12.2 (L) 10/10/2019   PLT 207 10/10/2019   NA 139 10/10/2019   K 4.3 10/10/2019   CL 105 10/10/2019   CO2 27 10/10/2019   GLUCOSE 98 10/10/2019   BUN 23 (H) 10/10/2019   CREATININE 1.00 10/10/2019   BILITOT 0.5 10/10/2019   ALKPHOS 69 10/10/2019   AST 15 10/10/2019   ALT 22 10/10/2019   PROT 7.0 10/10/2019   ALBUMIN 4.3 10/10/2019   CALCIUM 9.7 10/10/2019   GFRAA >60 10/10/2019  August 27, 2019 lupus anticoagulant negative, double-stranded DNA negative, ANA negative, ESR 50, RF 14.8, TSH normal, HIV negative  Speciality Comments: No specialty comments available.  Procedures:  No procedures performed Allergies: Patient has no known allergies.   Assessment / Plan:     Visit Diagnoses: Chronic inflammatory arthritis-patient has chronic inflammatory arthritis going on for the last few months.  I had detailed discussion with Dr. Marin Olp regarding the situation.  Patient does not want to take any medications to suppress his immune system or which would increase risk of infection or increased risk of malignancy.  He recently lost his brother from [place].  Dr. Marin Olp believes that he has some chronic inflammatory arthritis is most likely related to tapering immunotherapy.  He believes his symptoms should continue to improve.  According to him he is doing much better since he has been on prednisone.  He will taper his prednisone gradually as his symptoms will improve.  He does not recommend any further work-up or lab work to evaluate this further.  I explained the situation to patient and his wife who was on the phone.  He was in agreement.  I have advised him to contact me in case his symptoms get worse.  Rheumatoid factor positive-he has low titer positive rheumatoid factor.  Adrenal insufficiency (HCC)-patient states he has adrenal insufficiency for the last 5 years and he has been on hydrocortisone.  Other medical  problems are listed as follows:  Malignant melanoma of right great toe Stage IIIB ([id number])  Neuroendocrine carcinoma of pancreas (HCC)  Chronic diastolic congestive heart failure (HCC)  Paroxysmal supraventricular tachycardia (HCC)  History of hyperlipidemia  Bilateral pulmonary embolism (HCC)  Acute respiratory failure with hypoxia (HCC)  Drug-induced pneumonitis - VERVOY  HCAP (healthcare-associated pneumonia)  Obstructive sleep apnea  Severe sepsis with septic shock ([place]) - 08/2019  Orders: No orders of the defined types were placed in this  encounter.  No orders of the defined types were placed in this encounter.   Face-to-face time spent with patient was 60 minutes. Greater than 50% of time was spent in counseling and coordination of care.  Follow-Up Instructions: Return if symptoms worsen or fail to improve, for Inflammatory arthritis.   Bo Merino, MD  Note - This record has been created using Editor, commissioning.  Chart creation errors have been sought, but may not always  have been located. Such creation errors do not reflect on  the standard of medical care.

## 2019-10-17 ENCOUNTER — Ambulatory Visit: Payer: Self-pay

## 2019-10-17 ENCOUNTER — Ambulatory Visit: Payer: PPO | Admitting: Rheumatology

## 2019-10-17 ENCOUNTER — Encounter: Payer: Self-pay | Admitting: Rheumatology

## 2019-10-17 ENCOUNTER — Other Ambulatory Visit: Payer: Self-pay

## 2019-10-17 VITALS — BP 123/76 | HR 93 | Resp 16 | Ht 71.0 in | Wt 230.0 lb

## 2019-10-17 DIAGNOSIS — C7A8 Other malignant neuroendocrine tumors: Secondary | ICD-10-CM | POA: Diagnosis not present

## 2019-10-17 DIAGNOSIS — T50905A Adverse effect of unspecified drugs, medicaments and biological substances, initial encounter: Secondary | ICD-10-CM

## 2019-10-17 DIAGNOSIS — G4733 Obstructive sleep apnea (adult) (pediatric): Secondary | ICD-10-CM

## 2019-10-17 DIAGNOSIS — M1990 Unspecified osteoarthritis, unspecified site: Secondary | ICD-10-CM | POA: Diagnosis not present

## 2019-10-17 DIAGNOSIS — M79671 Pain in right foot: Secondary | ICD-10-CM | POA: Insufficient documentation

## 2019-10-17 DIAGNOSIS — I2699 Other pulmonary embolism without acute cor pulmonale: Secondary | ICD-10-CM

## 2019-10-17 DIAGNOSIS — J189 Pneumonia, unspecified organism: Secondary | ICD-10-CM | POA: Diagnosis not present

## 2019-10-17 DIAGNOSIS — J9601 Acute respiratory failure with hypoxia: Secondary | ICD-10-CM

## 2019-10-17 DIAGNOSIS — A419 Sepsis, unspecified organism: Secondary | ICD-10-CM

## 2019-10-17 DIAGNOSIS — Z8639 Personal history of other endocrine, nutritional and metabolic disease: Secondary | ICD-10-CM | POA: Diagnosis not present

## 2019-10-17 DIAGNOSIS — R768 Other specified abnormal immunological findings in serum: Secondary | ICD-10-CM

## 2019-10-17 DIAGNOSIS — I471 Supraventricular tachycardia: Secondary | ICD-10-CM

## 2019-10-17 DIAGNOSIS — E2740 Unspecified adrenocortical insufficiency: Secondary | ICD-10-CM | POA: Diagnosis not present

## 2019-10-17 DIAGNOSIS — R6521 Severe sepsis with septic shock: Secondary | ICD-10-CM

## 2019-10-17 DIAGNOSIS — I5032 Chronic diastolic (congestive) heart failure: Secondary | ICD-10-CM | POA: Diagnosis not present

## 2019-10-17 DIAGNOSIS — C4371 Malignant melanoma of right lower limb, including hip: Secondary | ICD-10-CM | POA: Diagnosis not present

## 2019-10-18 DIAGNOSIS — E782 Mixed hyperlipidemia: Secondary | ICD-10-CM | POA: Diagnosis not present

## 2019-10-18 DIAGNOSIS — I10 Essential (primary) hypertension: Secondary | ICD-10-CM | POA: Diagnosis not present

## 2019-11-05 ENCOUNTER — Other Ambulatory Visit: Payer: Self-pay | Admitting: Hematology & Oncology

## 2019-11-18 DIAGNOSIS — I959 Hypotension, unspecified: Secondary | ICD-10-CM | POA: Diagnosis not present

## 2019-11-18 DIAGNOSIS — E785 Hyperlipidemia, unspecified: Secondary | ICD-10-CM | POA: Diagnosis not present

## 2019-11-19 ENCOUNTER — Encounter (HOSPITAL_COMMUNITY): Admission: RE | Admit: 2019-11-19 | Payer: PPO | Source: Ambulatory Visit

## 2019-11-19 ENCOUNTER — Telehealth: Payer: Self-pay | Admitting: *Deleted

## 2019-11-19 NOTE — Telephone Encounter (Signed)
Returned wife's phone call regarding PET scan for today, and appointments with Dr. Marin Olp for 11/21/2019. She stated," that he woke up with nausea and vomiting, like a stomach bug. I gave her central scheduling's phone number to reschedule the PET scan. Once, we know when the PET scan is, we can reschedule the lab/MD appointment. She verbalized understanding.

## 2019-11-21 ENCOUNTER — Inpatient Hospital Stay: Payer: PPO | Admitting: Hematology & Oncology

## 2019-11-21 ENCOUNTER — Inpatient Hospital Stay: Payer: PPO

## 2019-11-22 ENCOUNTER — Other Ambulatory Visit: Payer: Self-pay | Admitting: Hematology & Oncology

## 2019-11-26 ENCOUNTER — Ambulatory Visit (HOSPITAL_COMMUNITY)
Admission: RE | Admit: 2019-11-26 | Discharge: 2019-11-26 | Disposition: A | Discharge status: Home / Self Care | Payer: PPO | Source: Ambulatory Visit | Attending: Hematology & Oncology | Admitting: Hematology & Oncology

## 2019-11-26 ENCOUNTER — Other Ambulatory Visit: Payer: Self-pay

## 2019-11-26 DIAGNOSIS — C4371 Malignant melanoma of right lower limb, including hip: Secondary | ICD-10-CM | POA: Diagnosis not present

## 2019-11-26 LAB — GLUCOSE, CAPILLARY: Glucose-Capillary: 88 mg/dL (ref 70–99)

## 2019-11-26 MED ORDER — FLUDEOXYGLUCOSE F - 18 (FDG) INJECTION
11.4900 | Freq: Once | INTRAVENOUS | Status: AC | PRN
  Administered 2019-11-26: 11.49 via INTRAVENOUS

## 2019-11-27 ENCOUNTER — Encounter: Payer: Self-pay | Admitting: Hematology & Oncology

## 2019-11-27 ENCOUNTER — Inpatient Hospital Stay: Payer: PPO | Attending: Hematology & Oncology

## 2019-11-27 ENCOUNTER — Inpatient Hospital Stay (HOSPITAL_BASED_OUTPATIENT_CLINIC_OR_DEPARTMENT_OTHER): Payer: PPO | Admitting: Hematology & Oncology

## 2019-11-27 VITALS — BP 139/93 | HR 62 | Temp 96.8°F | Resp 20 | Wt 237.8 lb

## 2019-11-27 DIAGNOSIS — C7A098 Malignant carcinoid tumors of other sites: Secondary | ICD-10-CM | POA: Diagnosis not present

## 2019-11-27 DIAGNOSIS — Z79899 Other long term (current) drug therapy: Secondary | ICD-10-CM | POA: Insufficient documentation

## 2019-11-27 DIAGNOSIS — Z9221 Personal history of antineoplastic chemotherapy: Secondary | ICD-10-CM | POA: Insufficient documentation

## 2019-11-27 DIAGNOSIS — C7A8 Other malignant neuroendocrine tumors: Secondary | ICD-10-CM | POA: Diagnosis not present

## 2019-11-27 DIAGNOSIS — D509 Iron deficiency anemia, unspecified: Secondary | ICD-10-CM | POA: Insufficient documentation

## 2019-11-27 DIAGNOSIS — C4371 Malignant melanoma of right lower limb, including hip: Secondary | ICD-10-CM | POA: Insufficient documentation

## 2019-11-27 LAB — CBC WITH DIFFERENTIAL (CANCER CENTER ONLY)
Abs Immature Granulocytes: 0.09 10*3/uL — ABNORMAL HIGH (ref 0.00–0.07)
Basophils Absolute: 0 10*3/uL (ref 0.0–0.1)
Basophils Relative: 0 %
Eosinophils Absolute: 0 10*3/uL (ref 0.0–0.5)
Eosinophils Relative: 0 %
HCT: 39.3 % (ref 39.0–52.0)
Hemoglobin: 13.5 g/dL (ref 13.0–17.0)
Immature Granulocytes: 1 %
Lymphocytes Relative: 9 %
Lymphs Abs: 0.7 10*3/uL (ref 0.7–4.0)
MCH: 31 pg (ref 26.0–34.0)
MCHC: 34.4 g/dL (ref 30.0–36.0)
MCV: 90.3 fL (ref 80.0–100.0)
Monocytes Absolute: 0.3 10*3/uL (ref 0.1–1.0)
Monocytes Relative: 3 %
Neutro Abs: 6.7 10*3/uL (ref 1.7–7.7)
Neutrophils Relative %: 87 %
Platelet Count: 201 10*3/uL (ref 150–400)
RBC: 4.35 MIL/uL (ref 4.22–5.81)
RDW: 16 % — ABNORMAL HIGH (ref 11.5–15.5)
WBC Count: 7.8 10*3/uL (ref 4.0–10.5)
nRBC: 0 % (ref 0.0–0.2)

## 2019-11-27 LAB — CMP (CANCER CENTER ONLY)
ALT: 25 U/L (ref 0–44)
AST: 17 U/L (ref 15–41)
Albumin: 4.4 g/dL (ref 3.5–5.0)
Alkaline Phosphatase: 51 U/L (ref 38–126)
Anion gap: 8 (ref 5–15)
BUN: 22 mg/dL — ABNORMAL HIGH (ref 6–20)
CO2: 24 mmol/L (ref 22–32)
Calcium: 9.5 mg/dL (ref 8.9–10.3)
Chloride: 107 mmol/L (ref 98–111)
Creatinine: 0.99 mg/dL (ref 0.61–1.24)
GFR, Est AFR Am: >60 mL/min (ref >60)
GFR, Estimated: >60 mL/min (ref >60)
Glucose, Bld: 111 mg/dL — ABNORMAL HIGH (ref 70–99)
Potassium: 4.1 mmol/L (ref 3.5–5.1)
Sodium: 139 mmol/L (ref 135–145)
Total Bilirubin: 0.3 mg/dL (ref 0.3–1.2)
Total Protein: 6.6 g/dL (ref 6.5–8.1)

## 2019-11-27 LAB — LACTATE DEHYDROGENASE: LDH: 176 U/L (ref 98–192)

## 2019-11-27 LAB — RETICULOCYTES
Immature Retic Fract: 10.4 % (ref 2.3–15.9)
RBC.: 4.4 MIL/uL (ref 4.22–5.81)
Retic Count, Absolute: 71.7 10*3/uL (ref 19.0–186.0)
Retic Ct Pct: 1.6 % (ref 0.4–3.1)

## 2019-11-27 NOTE — Progress Notes (Signed)
Hematology and Oncology Follow Up Visit  James Byrd [id number] 1966-05-20 54 y.o. 11/27/2019   Principle Diagnosis:  Stage IIIB (T3bN1aM0) subungual melanoma of the right hallux -- nodal recurrence in the RIGHT inguinal nodes -- BRAF wt Neuroendocrine carcinoma of the pancreas Iron def anemia  Current Therapy:   Yervoy-status post 3 cycles - discontinued in September 2016 secondary to toxicity  XRT to the RIGHT inguinal basin Nivolumab 400 mg q month -- s/p cycle #8 -start in 12/2018 - d/c on 08/2019 due to toxicity Somatuline 120 mg IM q month -- started on 05/22/2019 - d/c on 08/2019 IV Iron -- Iron Dextran given on 09/18/2019   Interim History:  James Byrd is here today for follow-up. I must say that this is best of seen work in several months.  His anemia is now resolved.  As such, I am sure his iron deficiency is much better.  We did do a PET scan on him.  Thankfully, the PET scan did not show any evidence of recurrent or metastatic disease.  What ever activity that he had around the pancreas is now gone.  Everything really looks quite good.  His arthritis is doing much better.  He wants to get off the prednisone.  I gave him the taper schedule for his prednisone.  I think he stopped taking potassium now.  I think he stopped taking the oral iron.  He has had no fever.  He has had no cough.  Is no problems with bowels or bladder.  He was having some issues with urinary frequency but this is resolved.  He has had no issues with rashes.  He has had no headache.  His family will be going to the beach next week.    Overall, his performance status right now is ECOG 1.   Allergies: No Known Allergies  Past Medical History, Surgical history, Social history, and Family History were reviewed and updated.  Review of Systems: Review of Systems  Constitutional: Positive for malaise/fatigue.  HENT: Negative.   Eyes: Negative.   Respiratory: Positive for shortness of breath.     Cardiovascular: Negative.   Gastrointestinal: Positive for constipation and nausea.  Genitourinary: Negative.   Musculoskeletal: Positive for joint pain and myalgias.  Skin: Negative.   Neurological: Positive for tingling and focal weakness.  Endo/Heme/Allergies: Negative.   Psychiatric/Behavioral: Positive for memory loss.     Physical Exam:  weight is 237 lb 12.8 oz (107.9 kg). His temporal temperature is 96.8 F (36 C) (abnormal). His blood pressure is 139/93 (abnormal) and his pulse is 62. His respiration is 20 and oxygen saturation is 98%.   Wt Readings from Last 3 Encounters:  11/27/19 237 lb 12.8 oz (107.9 kg)  10/17/19 230 lb (104.3 kg)  10/10/19 227 lb (103 kg)    Physical Exam Vitals reviewed.  HENT:     Head: Normocephalic and atraumatic.  Eyes:     Pupils: Pupils are equal, round, and reactive to light.  Cardiovascular:     Rate and Rhythm: Normal rate and regular rhythm.     Heart sounds: Normal heart sounds.  Pulmonary:     Effort: Pulmonary effort is normal.     Breath sounds: Normal breath sounds.  Abdominal:     General: Bowel sounds are normal.     Palpations: Abdomen is soft.     Comments: Abdominal exam shows a soft abdomen.  Bowel sounds are present.  There is no palpable abdominal mass.  There is no palpable  liver or spleen tip.  He has a healing right inguinal lymphadenectomy scar.  I cannot palpate any obvious adenopathy.  The left inguinal area is unremarkable.    Musculoskeletal:        General: No tenderness or deformity. Normal range of motion.     Cervical back: Normal range of motion.     Comments: The big toe on the right foot is amputated.  There is no lymphedema in the right leg.  He has good pulses in his distal extremities.  Left leg is unremarkable.  Lymphadenopathy:     Cervical: No cervical adenopathy.  Skin:    General: Skin is warm and dry.     Findings: No erythema or rash.  Neurological:     Mental Status: He is alert and  oriented to person, place, and time.  Psychiatric:        Behavior: Behavior normal.        Thought Content: Thought content normal.        Judgment: Judgment normal.      Lab Results  Component Value Date   WBC 7.8 11/27/2019   HGB 13.5 11/27/2019   HCT 39.3 11/27/2019   MCV 90.3 11/27/2019   PLT 201 11/27/2019   Lab Results  Component Value Date   FERRITIN 1,737 (H) 10/10/2019   IRON 133 10/10/2019   TIBC 194 (L) 10/10/2019   UIBC 61 (L) 10/10/2019   IRONPCTSAT 69 (H) 10/10/2019   Lab Results  Component Value Date   RETICCTPCT 1.6 11/27/2019   RBC 4.40 11/27/2019   No results found for: KPAFRELGTCHN, LAMBDASER, KAPLAMBRATIO No results found for: IGGSERUM, IGA, IGMSERUM No results found for: Odetta Pink, SPEI   Chemistry      Component Value Date/Time   NA 139 11/27/2019 1104   NA 145 06/06/2017 1316   NA 141 10/16/2015 1334   K 4.1 11/27/2019 1104   K 4.1 06/06/2017 1316   K 3.9 10/16/2015 1334   CL 107 11/27/2019 1104   CL 108 06/06/2017 1316   CO2 24 11/27/2019 1104   CO2 26 06/06/2017 1316   CO2 21 (L) 10/16/2015 1334   BUN 22 (H) 11/27/2019 1104   BUN 19 06/06/2017 1316   BUN 20.0 10/16/2015 1334   CREATININE 0.99 11/27/2019 1104   CREATININE 1.0 06/06/2017 1316   CREATININE 1.0 10/16/2015 1334      Component Value Date/Time   CALCIUM 9.5 11/27/2019 1104   CALCIUM 9.4 06/06/2017 1316   CALCIUM 9.7 10/16/2015 1334   ALKPHOS 51 11/27/2019 1104   ALKPHOS 78 06/06/2017 1316   ALKPHOS 62 10/16/2015 1334   AST 17 11/27/2019 1104   AST 16 10/16/2015 1334   ALT 25 11/27/2019 1104   ALT 27 06/06/2017 1316   ALT 11 10/16/2015 1334   BILITOT 0.3 11/27/2019 1104   BILITOT 0.49 10/16/2015 1334      Impression and Plan: James Byrd is a very pleasant 53 yo caucasian gentleman with history of stage IIIB melanoma of the right hallux that was subungual with one microscopic positive inguinal lymph node. He  completed 3 cycles of Yervoy but stopped due to side effects.   It is amazing how well he is doing right now.  I am so happy for him.  I know it was a huge struggle with the immunotherapy.  He just is 1 who cannot take immunotherapy.  His quality of life is now back to where he would  like it.  He is able to be functional and be interactive with his family.  He is looking forward to his second grandchild coming in October.  We will plan to get him back in 3 months.  We will still need to do our PET scans on him.  I will do a PET scan week before I see him.    [name], MD 6/9/202112:19 PM

## 2019-11-28 LAB — IRON AND TIBC
Iron: 98 ug/dL (ref 42–163)
Saturation Ratios: 42 % (ref 20–55)
TIBC: 234 ug/dL (ref 202–409)
UIBC: 136 ug/dL (ref 117–376)

## 2019-11-28 LAB — FERRITIN: Ferritin: 974 ng/mL — ABNORMAL HIGH (ref 24–336)

## 2019-12-03 ENCOUNTER — Other Ambulatory Visit: Payer: Self-pay | Admitting: *Deleted

## 2019-12-18 DIAGNOSIS — I9589 Other hypotension: Secondary | ICD-10-CM | POA: Diagnosis not present

## 2019-12-18 DIAGNOSIS — E271 Primary adrenocortical insufficiency: Secondary | ICD-10-CM | POA: Diagnosis not present

## 2020-01-14 DIAGNOSIS — G629 Polyneuropathy, unspecified: Secondary | ICD-10-CM | POA: Diagnosis not present

## 2020-01-14 DIAGNOSIS — Z85820 Personal history of malignant melanoma of skin: Secondary | ICD-10-CM | POA: Diagnosis not present

## 2020-01-14 DIAGNOSIS — M1990 Unspecified osteoarthritis, unspecified site: Secondary | ICD-10-CM | POA: Diagnosis not present

## 2020-01-14 DIAGNOSIS — Z6832 Body mass index (BMI) 32.0-32.9, adult: Secondary | ICD-10-CM | POA: Diagnosis not present

## 2020-01-18 DIAGNOSIS — I9589 Other hypotension: Secondary | ICD-10-CM | POA: Diagnosis not present

## 2020-01-18 DIAGNOSIS — E271 Primary adrenocortical insufficiency: Secondary | ICD-10-CM | POA: Diagnosis not present

## 2020-02-13 ENCOUNTER — Other Ambulatory Visit: Payer: Self-pay

## 2020-02-13 ENCOUNTER — Ambulatory Visit (HOSPITAL_COMMUNITY)
Admission: RE | Admit: 2020-02-13 | Discharge: 2020-02-13 | Disposition: A | Discharge status: Home / Self Care | Payer: PPO | Source: Ambulatory Visit | Attending: Hematology & Oncology | Admitting: Hematology & Oncology

## 2020-02-13 DIAGNOSIS — C4371 Malignant melanoma of right lower limb, including hip: Secondary | ICD-10-CM | POA: Diagnosis not present

## 2020-02-13 DIAGNOSIS — M25461 Effusion, right knee: Secondary | ICD-10-CM | POA: Insufficient documentation

## 2020-02-13 DIAGNOSIS — C439 Malignant melanoma of skin, unspecified: Secondary | ICD-10-CM | POA: Diagnosis not present

## 2020-02-13 DIAGNOSIS — K7689 Other specified diseases of liver: Secondary | ICD-10-CM | POA: Insufficient documentation

## 2020-02-13 DIAGNOSIS — M25462 Effusion, left knee: Secondary | ICD-10-CM | POA: Diagnosis not present

## 2020-02-13 LAB — GLUCOSE, CAPILLARY: Glucose-Capillary: 79 mg/dL (ref 70–99)

## 2020-02-13 MED ORDER — FLUDEOXYGLUCOSE F - 18 (FDG) INJECTION
11.7000 | Freq: Once | INTRAVENOUS | Status: AC | PRN
  Administered 2020-02-13: 11.7 via INTRAVENOUS

## 2020-02-14 ENCOUNTER — Encounter: Payer: Self-pay | Admitting: *Deleted

## 2020-02-18 DIAGNOSIS — E271 Primary adrenocortical insufficiency: Secondary | ICD-10-CM | POA: Diagnosis not present

## 2020-02-18 DIAGNOSIS — I9589 Other hypotension: Secondary | ICD-10-CM | POA: Diagnosis not present

## 2020-02-20 ENCOUNTER — Inpatient Hospital Stay (HOSPITAL_BASED_OUTPATIENT_CLINIC_OR_DEPARTMENT_OTHER): Payer: PPO | Admitting: Hematology & Oncology

## 2020-02-20 ENCOUNTER — Other Ambulatory Visit: Payer: Self-pay

## 2020-02-20 ENCOUNTER — Inpatient Hospital Stay: Payer: PPO | Attending: Hematology & Oncology

## 2020-02-20 VITALS — BP 127/73 | HR 70 | Temp 98.7°F | Resp 18 | Wt 242.0 lb

## 2020-02-20 DIAGNOSIS — C4371 Malignant melanoma of right lower limb, including hip: Secondary | ICD-10-CM | POA: Diagnosis not present

## 2020-02-20 DIAGNOSIS — R110 Nausea: Secondary | ICD-10-CM | POA: Insufficient documentation

## 2020-02-20 DIAGNOSIS — D509 Iron deficiency anemia, unspecified: Secondary | ICD-10-CM | POA: Diagnosis not present

## 2020-02-20 DIAGNOSIS — M791 Myalgia, unspecified site: Secondary | ICD-10-CM | POA: Insufficient documentation

## 2020-02-20 DIAGNOSIS — R531 Weakness: Secondary | ICD-10-CM | POA: Insufficient documentation

## 2020-02-20 DIAGNOSIS — C7A8 Other malignant neuroendocrine tumors: Secondary | ICD-10-CM | POA: Insufficient documentation

## 2020-02-20 DIAGNOSIS — R413 Other amnesia: Secondary | ICD-10-CM | POA: Insufficient documentation

## 2020-02-20 DIAGNOSIS — R0602 Shortness of breath: Secondary | ICD-10-CM | POA: Insufficient documentation

## 2020-02-20 DIAGNOSIS — Z79899 Other long term (current) drug therapy: Secondary | ICD-10-CM | POA: Diagnosis not present

## 2020-02-20 DIAGNOSIS — M2550 Pain in unspecified joint: Secondary | ICD-10-CM | POA: Insufficient documentation

## 2020-02-20 DIAGNOSIS — K5900 Constipation, unspecified: Secondary | ICD-10-CM | POA: Insufficient documentation

## 2020-02-20 DIAGNOSIS — M6740 Ganglion, unspecified site: Secondary | ICD-10-CM | POA: Insufficient documentation

## 2020-02-20 DIAGNOSIS — R5383 Other fatigue: Secondary | ICD-10-CM | POA: Diagnosis not present

## 2020-02-20 LAB — IRON AND TIBC
Iron: 87 ug/dL (ref 42–163)
Saturation Ratios: 39 % (ref 20–55)
TIBC: 225 ug/dL (ref 202–409)
UIBC: 138 ug/dL (ref 117–376)

## 2020-02-20 LAB — CBC WITH DIFFERENTIAL (CANCER CENTER ONLY)
Abs Immature Granulocytes: 0.02 10*3/uL (ref 0.00–0.07)
Basophils Absolute: 0 10*3/uL (ref 0.0–0.1)
Basophils Relative: 0 %
Eosinophils Absolute: 0.3 10*3/uL (ref 0.0–0.5)
Eosinophils Relative: 6 %
HCT: 41.5 % (ref 39.0–52.0)
Hemoglobin: 13.9 g/dL (ref 13.0–17.0)
Immature Granulocytes: 0 %
Lymphocytes Relative: 17 %
Lymphs Abs: 0.9 10*3/uL (ref 0.7–4.0)
MCH: 31.6 pg (ref 26.0–34.0)
MCHC: 33.5 g/dL (ref 30.0–36.0)
MCV: 94.3 fL (ref 80.0–100.0)
Monocytes Absolute: 0.4 10*3/uL (ref 0.1–1.0)
Monocytes Relative: 7 %
Neutro Abs: 3.4 10*3/uL (ref 1.7–7.7)
Neutrophils Relative %: 70 %
Platelet Count: 182 10*3/uL (ref 150–400)
RBC: 4.4 MIL/uL (ref 4.22–5.81)
RDW: 12 % (ref 11.5–15.5)
WBC Count: 5 10*3/uL (ref 4.0–10.5)
nRBC: 0 % (ref 0.0–0.2)

## 2020-02-20 LAB — CMP (CANCER CENTER ONLY)
ALT: 15 U/L (ref 0–44)
AST: 14 U/L — ABNORMAL LOW (ref 15–41)
Albumin: 4.2 g/dL (ref 3.5–5.0)
Alkaline Phosphatase: 70 U/L (ref 38–126)
Anion gap: 10 (ref 5–15)
BUN: 16 mg/dL (ref 6–20)
CO2: 25 mmol/L (ref 22–32)
Calcium: 9.6 mg/dL (ref 8.9–10.3)
Chloride: 105 mmol/L (ref 98–111)
Creatinine: 0.96 mg/dL (ref 0.61–1.24)
GFR, Est AFR Am: >60 mL/min (ref >60)
GFR, Estimated: >60 mL/min (ref >60)
Glucose, Bld: 84 mg/dL (ref 70–99)
Potassium: 3.8 mmol/L (ref 3.5–5.1)
Sodium: 140 mmol/L (ref 135–145)
Total Bilirubin: 0.5 mg/dL (ref 0.3–1.2)
Total Protein: 7.1 g/dL (ref 6.5–8.1)

## 2020-02-20 LAB — LACTATE DEHYDROGENASE: LDH: 151 U/L (ref 98–192)

## 2020-02-20 LAB — FERRITIN: Ferritin: 1228 ng/mL — ABNORMAL HIGH (ref 24–336)

## 2020-02-20 NOTE — Progress Notes (Signed)
Hematology and Oncology Follow Up Visit  James Byrd [id number] 23-May-1966 54 y.o. 02/20/2020   Principle Diagnosis:  Stage IIIB (T3bN1aM0) subungual melanoma of the right hallux -- nodal recurrence in the RIGHT inguinal nodes -- BRAF wt Neuroendocrine carcinoma of the pancreas Iron def anemia  Current Therapy:   Yervoy-status post 3 cycles - discontinued in September 2016 secondary to toxicity  XRT to the RIGHT inguinal basin Nivolumab 400 mg q month -- s/p cycle #8 -start in 12/2018 - d/c on 08/2019 due to toxicity Somatuline 120 mg IM q month -- started on 05/22/2019 - d/c on 08/2019 IV Iron -- Iron Dextran given on 09/18/2019   Interim History:  James Byrd is here today for follow-up.  He continues to make improvement on many fronts.  Unfortunately, the arthritis is bothering him quite a bit.  He gets these ganglion cysts that pop up in different parts of his body.  He does see rheumatology.  I am not sure when he goes back to see the rheumatologist.  We did go ahead and do a PET scan on him.  This was on August 26.  The PET scan did not show any evidence of recurrent melanoma.  His wife just had knee surgery.  Her right knee was operated on about a month ago.  There is no problems with bowels or bladder.  He has had no problems with bleeding.  He has had a nice rise in his hemoglobin.  His next grandchild is coming in a month.     Overall, his performance status right now is ECOG 1.   Allergies: No Known Allergies  Past Medical History, Surgical history, Social history, and Family History were reviewed and updated.  Review of Systems: Review of Systems  Constitutional: Positive for malaise/fatigue.  HENT: Negative.   Eyes: Negative.   Respiratory: Positive for shortness of breath.   Cardiovascular: Negative.   Gastrointestinal: Positive for constipation and nausea.  Genitourinary: Negative.   Musculoskeletal: Positive for joint pain and myalgias.  Skin: Negative.     Neurological: Positive for tingling and focal weakness.  Endo/Heme/Allergies: Negative.   Psychiatric/Behavioral: Positive for memory loss.     Physical Exam:  weight is 242 lb (109.8 kg). His oral temperature is 98.7 F (37.1 C). His blood pressure is 127/73 and his pulse is 70. His respiration is 18 and oxygen saturation is 100%.   Wt Readings from Last 3 Encounters:  02/20/20 242 lb (109.8 kg)  11/27/19 237 lb 12.8 oz (107.9 kg)  10/17/19 230 lb (104.3 kg)    Physical Exam Vitals reviewed.  HENT:     Head: Normocephalic and atraumatic.  Eyes:     Pupils: Pupils are equal, round, and reactive to light.  Cardiovascular:     Rate and Rhythm: Normal rate and regular rhythm.     Heart sounds: Normal heart sounds.  Pulmonary:     Effort: Pulmonary effort is normal.     Breath sounds: Normal breath sounds.  Abdominal:     General: Bowel sounds are normal.     Palpations: Abdomen is soft.     Comments: Abdominal exam shows a soft abdomen.  Bowel sounds are present.  There is no palpable abdominal mass.  There is no palpable liver or spleen tip.  He has a healing right inguinal lymphadenectomy scar.  I cannot palpate any obvious adenopathy.  The left inguinal area is unremarkable.    Musculoskeletal:        General: No tenderness  or deformity. Normal range of motion.     Cervical back: Normal range of motion.     Comments: The big toe on the right foot is amputated.  There is no lymphedema in the right leg.  He has good pulses in his distal extremities.  Left leg is unremarkable.  Lymphadenopathy:     Cervical: No cervical adenopathy.  Skin:    General: Skin is warm and dry.     Findings: No erythema or rash.  Neurological:     Mental Status: He is alert and oriented to person, place, and time.  Psychiatric:        Behavior: Behavior normal.        Thought Content: Thought content normal.        Judgment: Judgment normal.      Lab Results  Component Value Date   WBC  5.0 02/20/2020   HGB 13.9 02/20/2020   HCT 41.5 02/20/2020   MCV 94.3 02/20/2020   PLT 182 02/20/2020   Lab Results  Component Value Date   FERRITIN 974 (H) 11/27/2019   IRON 98 11/27/2019   TIBC 234 11/27/2019   UIBC 136 11/27/2019   IRONPCTSAT 42 11/27/2019   Lab Results  Component Value Date   RETICCTPCT 1.6 11/27/2019   RBC 4.40 02/20/2020   No results found for: KPAFRELGTCHN, LAMBDASER, KAPLAMBRATIO No results found for: IGGSERUM, IGA, IGMSERUM No results found for: Odetta Pink, SPEI   Chemistry      Component Value Date/Time   NA 140 02/20/2020 0753   NA 145 06/06/2017 1316   NA 141 10/16/2015 1334   K 3.8 02/20/2020 0753   K 4.1 06/06/2017 1316   K 3.9 10/16/2015 1334   CL 105 02/20/2020 0753   CL 108 06/06/2017 1316   CO2 25 02/20/2020 0753   CO2 26 06/06/2017 1316   CO2 21 (L) 10/16/2015 1334   BUN 16 02/20/2020 0753   BUN 19 06/06/2017 1316   BUN 20.0 10/16/2015 1334   CREATININE 0.96 02/20/2020 0753   CREATININE 1.0 06/06/2017 1316   CREATININE 1.0 10/16/2015 1334      Component Value Date/Time   CALCIUM 9.6 02/20/2020 0753   CALCIUM 9.4 06/06/2017 1316   CALCIUM 9.7 10/16/2015 1334   ALKPHOS 70 02/20/2020 0753   ALKPHOS 78 06/06/2017 1316   ALKPHOS 62 10/16/2015 1334   AST 14 (L) 02/20/2020 0753   AST 16 10/16/2015 1334   ALT 15 02/20/2020 0753   ALT 27 06/06/2017 1316   ALT 11 10/16/2015 1334   BILITOT 0.5 02/20/2020 0753   BILITOT 0.49 10/16/2015 1334      Impression and Plan: James Byrd is a very pleasant 54 yo caucasian gentleman with history of stage IIIB melanoma of the right hallux that was subungual with one microscopic positive inguinal lymph node. He completed 3 cycles of Yervoy but stopped due to side effects.   It is amazing how well he is doing right now.  I am so happy for him.  I know it was a huge struggle with the immunotherapy.  He just is one who cannot take  immunotherapy.  His quality of life is now back to where he would like it.  He is able to be functional and be interactive with his family.  He is looking forward to his second grandchild coming in October.  We will plan to get him back in 3 months.  We will still need to  do our PET scans on him.  I will do a PET scan week before I see him.    [name], MD 9/2/20218:42 AM

## 2020-03-17 ENCOUNTER — Other Ambulatory Visit: Payer: Self-pay | Admitting: *Deleted

## 2020-03-17 ENCOUNTER — Telehealth: Payer: Self-pay | Admitting: *Deleted

## 2020-03-17 DIAGNOSIS — C4371 Malignant melanoma of right lower limb, including hip: Secondary | ICD-10-CM

## 2020-03-17 DIAGNOSIS — M06062 Rheumatoid arthritis without rheumatoid factor, left knee: Secondary | ICD-10-CM

## 2020-03-17 DIAGNOSIS — C7A8 Other malignant neuroendocrine tumors: Secondary | ICD-10-CM

## 2020-03-17 DIAGNOSIS — M06061 Rheumatoid arthritis without rheumatoid factor, right knee: Secondary | ICD-10-CM

## 2020-03-17 DIAGNOSIS — M25559 Pain in unspecified hip: Secondary | ICD-10-CM

## 2020-03-17 NOTE — Telephone Encounter (Signed)
Message received from patient's wife requesting a referral to Dr. Syed/Rhematologist for patient's RA.  Referral placed in Epic per order of Dr. Marin Olp.

## 2020-03-19 DIAGNOSIS — I9589 Other hypotension: Secondary | ICD-10-CM | POA: Diagnosis not present

## 2020-03-19 DIAGNOSIS — E271 Primary adrenocortical insufficiency: Secondary | ICD-10-CM | POA: Diagnosis not present

## 2020-03-20 ENCOUNTER — Encounter: Payer: Self-pay | Admitting: *Deleted

## 2020-03-23 NOTE — Progress Notes (Signed)
Office Visit Note  Patient: James Byrd             Date of Birth: 06/06/1966           MRN: [id number]             PCP: Emmaline Kluver, MD Referring: Street, [place], * Visit Date: 03/25/2020 Occupation: @GUAROCC @  Subjective:  Rheumatoid Arthritis (Tenderness, total body, cycst on bil wrist, bil elbow swelling, bil knee swelling)   History of Present Illness: James Byrd is a 54 y.o. male with history of inflammatory arthritis and positive rheumatoid factor.  He finished chemotherapy for melanoma in May 2021.  He states that the joint pain and discomfort got worse while he was on chemotherapy.  He has been having pain and discomfort in his both shoulders, both elbows, both hands, both knees.  Having swelling in his both knees.  He was given prednisone by Dr. Marin Olp.  He finished prednisone last Friday.  Activities of Daily Living:  Patient reports morning stiffness for 24 hours.   Patient Denies nocturnal pain.  Difficulty dressing/grooming: Reports Difficulty climbing stairs: Reports Difficulty getting out of chair: Reports Difficulty using hands for taps, buttons, cutlery, and/or writing: Reports  Review of Systems  Constitutional: Positive for fatigue. Negative for night sweats.  HENT: Positive for mouth dryness. Negative for mouth sores and nose dryness.   Eyes: Negative for redness and dryness.  Respiratory: Negative for shortness of breath and difficulty breathing.   Cardiovascular: Negative for chest pain, palpitations, hypertension, irregular heartbeat and swelling in legs/feet.  Gastrointestinal: Negative for constipation and diarrhea.  Endocrine: Positive for cold intolerance, excessive thirst and increased urination.  Genitourinary: Negative for difficulty urinating.  Musculoskeletal: Positive for arthralgias, gait problem, joint pain, joint swelling, morning stiffness and muscle tenderness. Negative for myalgias, muscle weakness and myalgias.    Skin: Negative for color change, rash, hair loss, nodules/bumps, skin tightness, ulcers and sensitivity to sunlight.  Allergic/Immunologic: Positive for susceptible to infections.  Neurological: Positive for numbness and weakness. Negative for dizziness, fainting, memory loss and night sweats.  Hematological: Negative for bruising/bleeding tendency and swollen glands.  Psychiatric/Behavioral: Positive for depressed mood and sleep disturbance. The patient is not nervous/anxious.     PMFS History:  Patient Active Problem List   Diagnosis Date Noted  . Pain in both feet 10/17/2019  . Bilateral pulmonary embolism ([place]) 08/26/2019  . Neuroendocrine carcinoma of pancreas ([place]) 05/22/2019  . Severe sepsis with septic shock ([place]) 01/15/2019  . Acute respiratory failure with hypoxia ([place]) 01/15/2019  . AKI (acute kidney injury) ([place]) 01/15/2019  . Septic shock ([place]) 01/13/2019  . Goals of care, counseling/discussion 12/17/2018  . Adrenal insufficiency ([place])   . Refractory nausea and vomiting   . Paranasal sinus disease   . Acute renal failure syndrome ([place])   . HCAP (healthcare-associated pneumonia) 03/06/2015  . Drug-induced pneumonitis - VERVOY   . Thrombocytopenia ([place]) 02/09/2015  . HLD (hyperlipidemia)   . Chronic diastolic congestive heart failure ([place])   . Malignant melanoma of right great toe Stage IIIB ([id number]) 11/14/2014  . Obstructive sleep apnea 07/19/2012  . Paroxysmal supraventricular tachycardia ([place]) 05/24/2012  . Chest pressure 05/24/2012  . Palpitations 05/24/2012  . Anxiety 05/24/2012    Past Medical History:  Diagnosis Date  . Adrenal insufficiency ([place])   . Anxiety 05/24/2012  . Complication of anesthesia    SLO TO AWAKEN MANY 8 TO 9 YRS AGO, NO ISSUES SINCE  . Goals of care, counseling/discussion  12/17/2018  . Gout   . History of kidney stones   . HLD (hyperlipidemia)   . Malignant melanoma of right great toe ([place]) 11/14/2014  . Neuroendocrine carcinoma of  pancreas ([place]) 05/22/2019  . Paroxysmal supraventricular tachycardia ([place]) 05/24/2012   Described in the treadmill report; details are pending   . PE (pulmonary thromboembolism) ([place]) 08/2019  . Rheumatoid arthritis ([place])   . Sleep apnea    MILD NO CPAP NEEDED    Family History  Adopted: Yes  Problem Relation Age of Onset  . Stroke Mother   . COPD Father   . Diabetes Father   . Diabetes Sister   . Diabetes Brother    Past Surgical History:  Procedure Laterality Date  . CHOLECYSTECTOMY    . ESOPHAGOGASTRODUODENOSCOPY (EGD) WITH PROPOFOL N/A 05/13/2019   Procedure: ESOPHAGOGASTRODUODENOSCOPY (EGD) WITH PROPOFOL;  Surgeon: Arta Silence, MD;  Location: [facility];  Service: Endoscopy;  Laterality: N/A;  . EXTRACORPOREAL SHOCK WAVE LITHOTRIPSY  2016  . FINE NEEDLE ASPIRATION N/A 05/13/2019   Procedure: FINE NEEDLE ASPIRATION (FNA) LINEAR;  Surgeon: Arta Silence, MD;  Location: [facility];  Service: Endoscopy;  Laterality: N/A;  . LYMPH NODE BIOPSY Right 11/06/2018   Procedure: RIGHT INGUINAL LYMPH NODE BIOPSY AND POSSIBLE RIGHT GROIN NODE DISECTION;  Surgeon: Alphonsa Overall, MD;  Location: [facility];  Service: General;  Laterality: Right;  . r toe partial ambutation    . ROTATOR CUFF REPAIR Right 2010   3 yrs ago  . UPPER ESOPHAGEAL ENDOSCOPIC ULTRASOUND (EUS) N/A 05/13/2019   Procedure: UPPER ESOPHAGEAL ENDOSCOPIC ULTRASOUND (EUS);  Surgeon: Arta Silence, MD;  Location: [facility];  Service: Endoscopy;  Laterality: N/A;   Social History   Social History Narrative   Lives with wife in a one story home.  Has 2 children.  On disability.  Education: high school.+   Immunization History  Administered Date(s) Administered  . Influenza-Unspecified 04/03/2014     Objective: Vital Signs: BP 129/78 (BP Location: Right Arm, Patient Position: Sitting, Cuff Size: Normal)   Pulse 86   Resp 16   Ht 6' (1.829 m)   Wt 245 lb 12.8 oz (111.5 kg)   BMI 33.34 kg/m    Physical  Exam Vitals and nursing note reviewed.  Constitutional:      Appearance: He is well-developed.  HENT:     Head: Normocephalic and atraumatic.  Eyes:     Conjunctiva/sclera: Conjunctivae normal.     Pupils: Pupils are equal, round, and reactive to light.  Cardiovascular:     Rate and Rhythm: Normal rate and regular rhythm.     Heart sounds: Normal heart sounds.  Pulmonary:     Effort: Pulmonary effort is normal.     Breath sounds: Normal breath sounds.  Abdominal:     General: Bowel sounds are normal.     Palpations: Abdomen is soft.  Musculoskeletal:     Cervical back: Normal range of motion and neck supple.  Skin:    General: Skin is warm and dry.     Capillary Refill: Capillary refill takes less than 2 seconds.  Neurological:     Mental Status: He is alert and oriented to person, place, and time.  Psychiatric:        Behavior: Behavior normal.      Musculoskeletal Exam: C-spine was in good range of motion.  He had painful range of motion of bilateral shoulders.  He has synovitis over bilateral elbows.  He synovitis on peripheral joints.  There  was tenderness across MCPs and PIPs but no synovitis was noted.  He had painful range of motion of his hip joints.  He had bilateral knee joint large effusions.  He had tenderness across MTPs and PIPs with no synovitis was noted.  CDAI Exam: CDAI Score: 12  Patient Global: 10 mm; Provider Global: 10 mm Swollen: 4 ; Tender: 6  Joint Exam 03/25/2020      Right  Left  Glenohumeral   Tender   Tender  Elbow  Swollen Tender     Wrist     Swollen Tender  Knee  Swollen Tender  Swollen Tender     Investigation: No additional findings.  Imaging: XR Foot 2 Views Left  Result Date: 03/25/2020 PIP and DIP narrowing was noted.  No MTP, intertarsal or tibiotalar joint space narrowing was noted. Impression: These findings are consistent with osteoarthritis of the foot.  XR Foot 2 Views Right  Result Date: 03/25/2020 Amputation of right  first distal phalanx was noted.  PIP and DIP narrowing was noted.  No MTP no intertarsal, tibiotalar or subtalar joint space narrowing was noted.  Inferior posterior calcaneal spurs were noted. Impression: These findings are consistent with postsurgical changes and osteoarthritis.  XR Hand 2 View Left  Result Date: 03/25/2020 No MCP narrowing was noted.  PIP and DIP narrowing was noted.  Intercarpal and radiocarpal joint space narrowing was noted. Impression: These findings are consistent with inflammatory arthritis and osteoarthritis overlap.  XR Hand 2 View Right  Result Date: 03/25/2020 No MCP narrowing was noted.  PIP and DIP narrowing was noted.  Intercarpal joint space narrowing was noted.  CMC narrowing was noted.  Few cystic versus erosive changes were noted in the carpal bones. Impression: These findings are consistent with inflammatory arthritis and osteoarthritis overlap.  XR KNEE 3 VIEW LEFT  Result Date: 03/25/2020 Moderate to severe medial compartment narrowing was noted.  Moderate patellofemoral narrowing was noted. Impression: These findings are consistent with moderate to severe osteoarthritis and moderate chondromalacia patella.  XR KNEE 3 VIEW RIGHT  Result Date: 03/25/2020 Moderate medial compartment narrowing was noted.  Intercondylar osteophytes were noted.  Moderate patellofemoral narrowing was noted. Impression: These findings are consistent moderate osteoarthritis and moderate chondromalacia patella.   Recent Labs: Lab Results  Component Value Date   WBC 5.0 02/20/2020   HGB 13.9 02/20/2020   PLT 182 02/20/2020   NA 140 02/20/2020   K 3.8 02/20/2020   CL 105 02/20/2020   CO2 25 02/20/2020   GLUCOSE 84 02/20/2020   BUN 16 02/20/2020   CREATININE 0.96 02/20/2020   BILITOT 0.5 02/20/2020   ALKPHOS 70 02/20/2020   AST 14 (L) 02/20/2020   ALT 15 02/20/2020   PROT 7.1 02/20/2020   ALBUMIN 4.2 02/20/2020   CALCIUM 9.6 02/20/2020   GFRAA >60 02/20/2020    August 27, 2019 lupus anticoagulant negative, beta-2 negative, anticardiolipin negative, ANA negative, double-stranded DNA negative, RF 14.8  August 26, 2019 HIV negative  January 15, 2019 chest x-ray done by Dr. Marin Olp.  Some scarring was noted due to previous pneumonia.  Speciality Comments: No specialty comments available.  Procedures:  No procedures performed Allergies: Patient has no known allergies.   Assessment / Plan:     Visit Diagnoses: Chronic inflammatory arthritis -due to checkpoint elevator use.  Referred by Dr. Marin Olp. Prednisone responsive.  He could not  take immunosuppressive agents in the past.  He just recently came off the prednisone taper.  He has severe inflammatory  arthritis today.  He is a lot of discomfort.  We had detailed discussion regarding different treatment options and their side effects.  Handout on methotrexate was given.   We will obtain basic labs today.- Plan: Sedimentation rate, Urinalysis, Routine w reflex microscopic  Rheumatoid factor positive-low titer positive.  Pain in both hands -he had pain and swelling in bilateral wrist joints and also some tenderness of her MCPs and PIPs.  Plan: Uric acid, Rheumatoid factor, Cyclic citrul peptide antibody, IgG, 14-3-3 eta Protein, XR Hand 2 View Right, XR Hand 2 View Left.  Intercarpal joint space narrowing was noted.  X-ray findings were consistent with osteoarthritis and inflammatory arthritis overlap.  Chronic pain of both knees -he had bilateral knee joint large effusion.  Plan: XR KNEE 3 VIEW RIGHT, XR KNEE 3 VIEW LEFT.  Bilateral moderate to severe osteoarthritis was noted.  Moderate chondromalacia patella was noted.  Pain in both feet -he had tenderness across MTPs but no synovitis was noted.  Plan: XR Foot 2 Views Right, XR Foot 2 Views Left x-rays were consistent with postsurgical changes and osteoarthritis.  High risk medication use -I will obtain following labs today.  Plan: Hepatitis B core antibody,  IgM, Hepatitis B surface antigen, Hepatitis C antibody, QuantiFERON-TB Gold Plus, Serum protein electrophoresis with reflex, IgG, IgA, IgM, Glucose 6 phosphate dehydrogenase, Thiopurine methyltransferase(tpmt)rbc  Other fatigue -he complains of myalgia and fatigue.  Plan: CK  Adrenal insufficiency (HCC)-he is on hydrocortisone.  Malignant melanoma of right great toe Stage IIIB ([id number])-treated with immunotherapy and resection of his toe.  Neuroendocrine carcinoma of pancreas (HCC)  Chronic diastolic congestive heart failure (HCC)  Paroxysmal supraventricular tachycardia (HCC)  History of hyperlipidemia  Bilateral pulmonary embolism (HCC)  Acute respiratory failure with hypoxia (HCC)  Drug-induced pneumonitis - VERVOY  HCAP (healthcare-associated pneumonia)  Obstructive sleep apnea  Severe sepsis with septic shock ([place]) - while receiving CTX 2016 for Melanoma  Orders: Orders Placed This Encounter  Procedures  . XR Hand 2 View Right  . XR Hand 2 View Left  . XR KNEE 3 VIEW RIGHT  . XR KNEE 3 VIEW LEFT  . XR Foot 2 Views Right  . XR Foot 2 Views Left  . Sedimentation rate  . CK  . Urinalysis, Routine w reflex microscopic  . Uric acid  . Rheumatoid factor  . Cyclic citrul peptide antibody, IgG  . 14-3-3 eta Protein  . Hepatitis B core antibody, IgM  . Hepatitis B surface antigen  . Hepatitis C antibody  . QuantiFERON-TB Gold Plus  . Serum protein electrophoresis with reflex  . IgG, IgA, IgM  . Glucose 6 phosphate dehydrogenase  . Thiopurine methyltransferase(tpmt)rbc   Meds ordered this encounter  Medications  . predniSONE (DELTASONE) 5 MG tablet    Sig: Take 4 tabs po x 7 days, 3  tabs po x 7 days, 2  tabs po x 7 days, 1  tab po x 14 days, 0.5 tab x 14 days    Dispense:  84 tablet    Refill:  0      Follow-Up Instructions: Return in about 2 weeks (around 04/08/2020) for Inflammatory arthritis, positive RF.   Bo Merino, MD  Note - This record  has been created using Editor, commissioning.  Chart creation errors have been sought, but may not always  have been located. Such creation errors do not reflect on  the standard of medical care.

## 2020-03-25 ENCOUNTER — Ambulatory Visit: Payer: Self-pay

## 2020-03-25 ENCOUNTER — Other Ambulatory Visit: Payer: Self-pay

## 2020-03-25 ENCOUNTER — Ambulatory Visit: Payer: PPO | Admitting: Rheumatology

## 2020-03-25 ENCOUNTER — Encounter: Payer: Self-pay | Admitting: Rheumatology

## 2020-03-25 VITALS — BP 129/78 | HR 86 | Resp 16 | Ht 72.0 in | Wt 245.8 lb

## 2020-03-25 DIAGNOSIS — G8929 Other chronic pain: Secondary | ICD-10-CM

## 2020-03-25 DIAGNOSIS — I5032 Chronic diastolic (congestive) heart failure: Secondary | ICD-10-CM | POA: Diagnosis not present

## 2020-03-25 DIAGNOSIS — C7A8 Other malignant neuroendocrine tumors: Secondary | ICD-10-CM | POA: Diagnosis not present

## 2020-03-25 DIAGNOSIS — M79641 Pain in right hand: Secondary | ICD-10-CM

## 2020-03-25 DIAGNOSIS — I471 Supraventricular tachycardia, unspecified: Secondary | ICD-10-CM

## 2020-03-25 DIAGNOSIS — M79671 Pain in right foot: Secondary | ICD-10-CM | POA: Diagnosis not present

## 2020-03-25 DIAGNOSIS — M79672 Pain in left foot: Secondary | ICD-10-CM

## 2020-03-25 DIAGNOSIS — E2740 Unspecified adrenocortical insufficiency: Secondary | ICD-10-CM | POA: Diagnosis not present

## 2020-03-25 DIAGNOSIS — M25562 Pain in left knee: Secondary | ICD-10-CM

## 2020-03-25 DIAGNOSIS — M25561 Pain in right knee: Secondary | ICD-10-CM

## 2020-03-25 DIAGNOSIS — G4733 Obstructive sleep apnea (adult) (pediatric): Secondary | ICD-10-CM

## 2020-03-25 DIAGNOSIS — M79642 Pain in left hand: Secondary | ICD-10-CM

## 2020-03-25 DIAGNOSIS — R5383 Other fatigue: Secondary | ICD-10-CM

## 2020-03-25 DIAGNOSIS — C4371 Malignant melanoma of right lower limb, including hip: Secondary | ICD-10-CM

## 2020-03-25 DIAGNOSIS — M1990 Unspecified osteoarthritis, unspecified site: Secondary | ICD-10-CM

## 2020-03-25 DIAGNOSIS — R768 Other specified abnormal immunological findings in serum: Secondary | ICD-10-CM

## 2020-03-25 DIAGNOSIS — R6521 Severe sepsis with septic shock: Secondary | ICD-10-CM

## 2020-03-25 DIAGNOSIS — Z79899 Other long term (current) drug therapy: Secondary | ICD-10-CM

## 2020-03-25 DIAGNOSIS — Z8639 Personal history of other endocrine, nutritional and metabolic disease: Secondary | ICD-10-CM

## 2020-03-25 DIAGNOSIS — J189 Pneumonia, unspecified organism: Secondary | ICD-10-CM

## 2020-03-25 DIAGNOSIS — A419 Sepsis, unspecified organism: Secondary | ICD-10-CM

## 2020-03-25 DIAGNOSIS — I2699 Other pulmonary embolism without acute cor pulmonale: Secondary | ICD-10-CM

## 2020-03-25 DIAGNOSIS — T50905A Adverse effect of unspecified drugs, medicaments and biological substances, initial encounter: Secondary | ICD-10-CM

## 2020-03-25 DIAGNOSIS — J9601 Acute respiratory failure with hypoxia: Secondary | ICD-10-CM

## 2020-03-25 MED ORDER — PREDNISONE 5 MG PO TABS: ORAL_TABLET | ORAL | 0 refills | Status: DC

## 2020-03-25 NOTE — Patient Instructions (Signed)
Methotrexate injection What is this medicine? METHOTREXATE (METH oh TREX ate) is a chemotherapy drug used to treat cancer including breast cancer, leukemia, and lymphoma. This medicine can also be used to treat psoriasis and certain kinds of arthritis. This medicine may be used for other purposes; ask your health care provider or pharmacist if you have questions. What should I tell my health care provider before I take this medicine? They need to know if you have any of these conditions:  fluid in the stomach area or lungs  if you often drink alcohol  infection or immune system problems  kidney disease  liver disease  low blood counts, like low white cell, platelet, or red cell counts  lung disease  radiation therapy  stomach ulcers  ulcerative colitis  an unusual or allergic reaction to methotrexate, other medicines, foods, dyes, or preservatives  pregnant or trying to get pregnant  breast-feeding How should I use this medicine? This medicine is for infusion into a vein or for injection into muscle or into the spinal fluid (whichever applies). It is usually given by a health care professional in a hospital or clinic setting. In rare cases, you might get this medicine at home. You will be taught how to give this medicine. Use exactly as directed. Take your medicine at regular intervals. Do not take your medicine more often than directed. If this medicine is used for arthritis or psoriasis, it should be taken weekly, NOT daily. It is important that you put your used needles and syringes in a special sharps container. Do not put them in a trash can. If you do not have a sharps container, call your pharmacist or healthcare provider to get one. Talk to your pediatrician regarding the use of this medicine in children. While this drug may be prescribed for children as young as 2 years for selected conditions, precautions do apply. Overdosage: If you think you have taken too much of  this medicine contact a poison control center or emergency room at once. NOTE: This medicine is only for you. Do not share this medicine with others. What if I miss a dose? It is important not to miss your dose. Call your doctor or health care professional if you are unable to keep an appointment. If you give yourself the medicine and you miss a dose, talk with your doctor or health care professional. Do not take double or extra doses. What may interact with this medicine? This medicine may interact with the following medications:  acitretin  aspirin or aspirin-like medicines including salicylates  azathioprine  certain antibiotics like chloramphenicol, penicillin, tetracycline  certain medicines for stomach problems like esomeprazole, omeprazole, pantoprazole  cyclosporine  gold  hydroxychloroquine  live virus vaccines  mercaptopurine  NSAIDs, medicines for pain and inflammation, like ibuprofen or naproxen  other cytotoxic agents  penicillamine  phenylbutazone  phenytoin  probenacid  retinoids such as isotretinoin and tretinoin  steroid medicines like prednisone or cortisone  sulfonamides like sulfasalazine and trimethoprim/sulfamethoxazole  theophylline This list may not describe all possible interactions. Give your health care provider a list of all the medicines, herbs, non-prescription drugs, or dietary supplements you use. Also tell them if you smoke, drink alcohol, or use illegal drugs. Some items may interact with your medicine. What should I watch for while using this medicine? Avoid alcoholic drinks. In some cases, you may be given additional medicines to help with side effects. Follow all directions for their use. This medicine can make you more sensitive to   the sun. Keep out of the sun. If you cannot avoid being in the sun, wear protective clothing and use sunscreen. Do not use sun lamps or tanning beds/booths. You may get drowsy or dizzy. Do not drive,  use machinery, or do anything that needs mental alertness until you know how this medicine affects you. Do not stand or sit up quickly, especially if you are an older patient. This reduces the risk of dizzy or fainting spells. You may need blood work done while you are taking this medicine. Call your doctor or health care professional for advice if you get a fever, chills or sore throat, or other symptoms of a cold or flu. Do not treat yourself. This drug decreases your body's ability to fight infections. Try to avoid being around people who are sick. This medicine may increase your risk to bruise or bleed. Call your doctor or health care professional if you notice any unusual bleeding. Check with your doctor or health care professional if you get an attack of severe diarrhea, nausea and vomiting, or if you sweat a lot. The loss of too much body fluid can make it dangerous for you to take this medicine. Talk to your doctor about your risk of cancer. You may be more at risk for certain types of cancers if you take this medicine. Both men and women must use effective birth control with this medicine. Do not become pregnant while taking this medicine or until at least 1 normal menstrual cycle has occurred after stopping it. Women should inform their doctor if they wish to become pregnant or think they might be pregnant. Men should not father a child while taking this medicine and for 3 months after stopping it. There is a potential for serious side effects to an unborn child. Talk to your health care professional or pharmacist for more information. Do not breast-feed an infant while taking this medicine. What side effects may I notice from receiving this medicine? Side effects that you should report to your doctor or health care professional as soon as possible:  allergic reactions like skin rash, itching or hives, swelling of the face, lips, or tongue  back pain  breathing problems or shortness of  breath  confusion  diarrhea  dry, nonproductive cough  low blood counts - this medicine may decrease the number of white blood cells, red blood cells and platelets. You may be at increased risk of infections and bleeding  mouth sores  redness, blistering, peeling or loosening of the skin, including inside the mouth  seizures  severe headaches  signs of infection - fever or chills, cough, sore throat, pain or difficulty passing urine  signs and symptoms of bleeding such as bloody or black, tarry stools; red or dark-brown urine; spitting up blood or brown material that looks like coffee grounds; red spots on the skin; unusual bruising or bleeding from the eye, gums, or nose  signs and symptoms of kidney injury like trouble passing urine or change in the amount of urine  signs and symptoms of liver injury like dark yellow or brown urine; general ill feeling or flu-like symptoms; light-colored stools; loss of appetite; nausea; right upper belly pain; unusually weak or tired; yellowing of the eyes or skin  stiff neck  vomiting Side effects that usually do not require medical attention (report to your doctor or health care professional if they continue or are bothersome):  dizziness  hair loss  headache  stomach pain  upset stomach This list   may not describe all possible side effects. Call your doctor for medical advice about side effects. You may report side effects to FDA at 1-800-FDA-1088. Where should I keep my medicine? If you are using this medicine at home, you will be instructed on how to store this medicine. Throw away any unused medicine after the expiration date on the label. NOTE: This sheet is a summary. It may not cover all possible information. If you have questions about this medicine, talk to your doctor, pharmacist, or health care provider.  2020 Elsevier/Gold Standard (2017-01-26 13:31:42)   

## 2020-03-27 NOTE — Progress Notes (Signed)
Office Visit Note  Patient: James Byrd             Date of Birth: 1966/01/16           MRN: [id number]             PCP: Emmaline Kluver, MD Referring: Street, [place], * Visit Date: 04/08/2020 Occupation: @GUAROCC @  Subjective:  Pain and swelling in multiple joints.   History of Present Illness: James Byrd is a 54 y.o. male with history of chronic inflammatory arthritis which started after checkpoint inhibitors.  He has been on prednisone 15 mg p.o. daily since his last visit.  He continues to have pain and discomfort in his right elbow, bilateral wrist and his bilateral knees.  He states joints continued to swell.  Activities of Daily Living:  Patient reports morning stiffness for all day.   Patient Reports nocturnal pain.  Difficulty dressing/grooming: Reports Difficulty climbing stairs: Reports Difficulty getting out of chair: Reports Difficulty using hands for taps, buttons, cutlery, and/or writing: Reports  Review of Systems  Constitutional: Positive for fatigue.  HENT: Negative for mouth sores, mouth dryness and nose dryness.   Eyes: Negative for pain, itching and dryness.  Respiratory: Negative for shortness of breath and difficulty breathing.   Cardiovascular: Negative for chest pain and palpitations.  Gastrointestinal: Negative for blood in stool, constipation and diarrhea.  Endocrine: Negative for increased urination.  Genitourinary: Negative for difficulty urinating.  Musculoskeletal: Positive for arthralgias, joint pain, joint swelling, myalgias, morning stiffness, muscle tenderness and myalgias.  Skin: Negative for color change, rash and redness.  Allergic/Immunologic: Positive for susceptible to infections.  Neurological: Positive for numbness. Negative for dizziness, headaches, memory loss and weakness.  Hematological: Positive for bruising/bleeding tendency.  Psychiatric/Behavioral: Positive for sleep disturbance. Negative for confusion.     PMFS History:  Patient Active Problem List   Diagnosis Date Noted  . Pain in both feet 10/17/2019  . Bilateral pulmonary embolism ([place]) 08/26/2019  . Neuroendocrine carcinoma of pancreas ([place]) 05/22/2019  . Severe sepsis with septic shock ([place]) 01/15/2019  . Acute respiratory failure with hypoxia ([place]) 01/15/2019  . AKI (acute kidney injury) ([place]) 01/15/2019  . Septic shock ([place]) 01/13/2019  . Goals of care, counseling/discussion 12/17/2018  . Adrenal insufficiency ([place])   . Refractory nausea and vomiting   . Paranasal sinus disease   . Acute renal failure syndrome ([place])   . HCAP (healthcare-associated pneumonia) 03/06/2015  . Drug-induced pneumonitis - VERVOY   . Thrombocytopenia ([place]) 02/09/2015  . HLD (hyperlipidemia)   . Chronic diastolic congestive heart failure ([place])   . Malignant melanoma of right great toe Stage IIIB ([id number]) 11/14/2014  . Obstructive sleep apnea 07/19/2012  . Paroxysmal supraventricular tachycardia ([place]) 05/24/2012  . Chest pressure 05/24/2012  . Palpitations 05/24/2012  . Anxiety 05/24/2012    Past Medical History:  Diagnosis Date  . Adrenal insufficiency ([place])   . Anxiety 05/24/2012  . Complication of anesthesia    SLO TO AWAKEN MANY 8 TO 9 YRS AGO, NO ISSUES SINCE  . Goals of care, counseling/discussion 12/17/2018  . Gout   . History of kidney stones   . HLD (hyperlipidemia)   . Malignant melanoma of right great toe ([place]) 11/14/2014  . Neuroendocrine carcinoma of pancreas ([place]) 05/22/2019  . Paroxysmal supraventricular tachycardia ([place]) 05/24/2012   Described in the treadmill report; details are pending   . PE (pulmonary thromboembolism) ([place]) 08/2019  . Rheumatoid arthritis ([place])   . Sleep apnea  MILD NO CPAP NEEDED    Family History  Adopted: Yes  Problem Relation Age of Onset  . Stroke Mother   . COPD Father   . Diabetes Father   . Diabetes Sister   . Diabetes Brother    Past Surgical History:  Procedure Laterality Date  .  CHOLECYSTECTOMY    . ESOPHAGOGASTRODUODENOSCOPY (EGD) WITH PROPOFOL N/A 05/13/2019   Procedure: ESOPHAGOGASTRODUODENOSCOPY (EGD) WITH PROPOFOL;  Surgeon: Arta Silence, MD;  Location: [facility];  Service: Endoscopy;  Laterality: N/A;  . EXTRACORPOREAL SHOCK WAVE LITHOTRIPSY  2016  . FINE NEEDLE ASPIRATION N/A 05/13/2019   Procedure: FINE NEEDLE ASPIRATION (FNA) LINEAR;  Surgeon: Arta Silence, MD;  Location: [facility];  Service: Endoscopy;  Laterality: N/A;  . LYMPH NODE BIOPSY Right 11/06/2018   Procedure: RIGHT INGUINAL LYMPH NODE BIOPSY AND POSSIBLE RIGHT GROIN NODE DISECTION;  Surgeon: Alphonsa Overall, MD;  Location: [facility];  Service: General;  Laterality: Right;  . r toe partial ambutation    . ROTATOR CUFF REPAIR Right 2010   3 yrs ago  . UPPER ESOPHAGEAL ENDOSCOPIC ULTRASOUND (EUS) N/A 05/13/2019   Procedure: UPPER ESOPHAGEAL ENDOSCOPIC ULTRASOUND (EUS);  Surgeon: Arta Silence, MD;  Location: [facility];  Service: Endoscopy;  Laterality: N/A;   Social History   Social History Narrative   Lives with wife in a one story home.  Has 2 children.  On disability.  Education: high school.+   Immunization History  Administered Date(s) Administered  . Influenza-Unspecified 04/03/2014     Objective: Vital Signs: BP 117/81 (BP Location: Left Arm, Patient Position: Sitting, Cuff Size: Normal)   Pulse 71   Resp 17   Ht 6' (1.829 m)   Wt 243 lb 9.6 oz (110.5 kg)   BMI 33.04 kg/m    Physical Exam Vitals and nursing note reviewed.  Constitutional:      Appearance: He is well-developed.  HENT:     Head: Normocephalic and atraumatic.  Eyes:     Conjunctiva/sclera: Conjunctivae normal.     Pupils: Pupils are equal, round, and reactive to light.  Cardiovascular:     Rate and Rhythm: Normal rate and regular rhythm.     Heart sounds: Normal heart sounds.  Pulmonary:     Effort: Pulmonary effort is normal.     Breath sounds: Normal breath sounds.  Abdominal:     General:  Bowel sounds are normal.     Palpations: Abdomen is soft.  Musculoskeletal:     Cervical back: Normal range of motion and neck supple.  Skin:    General: Skin is warm and dry.     Capillary Refill: Capillary refill takes less than 2 seconds.  Neurological:     Mental Status: He is alert and oriented to person, place, and time.  Psychiatric:        Behavior: Behavior normal.      Musculoskeletal Exam: The spine was in good range of motion. Shoulder joints with good range of motion. He has contracture in his right elbow. He had extensor tenosynovitis in his bilateral elbow joints. He had warmth and swelling in his bilateral knee joints and a large effusion in his left knee joint.  CDAI Exam: CDAI Score: 11  Patient Global: 4 mm; Provider Global: 6 mm Swollen: 5 ; Tender: 5  Joint Exam 04/08/2020      Right  Left  Elbow  Swollen Tender     Wrist  Swollen Tender  Swollen Tender  Knee  Swollen Tender  Swollen  Tender     Investigation: No additional findings.  Imaging: XR Foot 2 Views Left  Result Date: 03/25/2020 PIP and DIP narrowing was noted.  No MTP, intertarsal or tibiotalar joint space narrowing was noted. Impression: These findings are consistent with osteoarthritis of the foot.  XR Foot 2 Views Right  Result Date: 03/25/2020 Amputation of right first distal phalanx was noted.  PIP and DIP narrowing was noted.  No MTP no intertarsal, tibiotalar or subtalar joint space narrowing was noted.  Inferior posterior calcaneal spurs were noted. Impression: These findings are consistent with postsurgical changes and osteoarthritis.  XR Hand 2 View Left  Result Date: 03/25/2020 No MCP narrowing was noted.  PIP and DIP narrowing was noted.  Intercarpal and radiocarpal joint space narrowing was noted. Impression: These findings are consistent with inflammatory arthritis and osteoarthritis overlap.  XR Hand 2 View Right  Result Date: 03/25/2020 No MCP narrowing was noted.  PIP and  DIP narrowing was noted.  Intercarpal joint space narrowing was noted.  CMC narrowing was noted.  Few cystic versus erosive changes were noted in the carpal bones. Impression: These findings are consistent with inflammatory arthritis and osteoarthritis overlap.  XR KNEE 3 VIEW LEFT  Result Date: 03/25/2020 Moderate to severe medial compartment narrowing was noted.  Moderate patellofemoral narrowing was noted. Impression: These findings are consistent with moderate to severe osteoarthritis and moderate chondromalacia patella.  XR KNEE 3 VIEW RIGHT  Result Date: 03/25/2020 Moderate medial compartment narrowing was noted.  Intercondylar osteophytes were noted.  Moderate patellofemoral narrowing was noted. Impression: These findings are consistent moderate osteoarthritis and moderate chondromalacia patella.   Recent Labs: Lab Results  Component Value Date   WBC 5.0 02/20/2020   HGB 13.9 02/20/2020   PLT 182 02/20/2020   NA 140 02/20/2020   K 3.8 02/20/2020   CL 105 02/20/2020   CO2 25 02/20/2020   GLUCOSE 84 02/20/2020   BUN 16 02/20/2020   CREATININE 0.96 02/20/2020   BILITOT 0.5 02/20/2020   ALKPHOS 70 02/20/2020   AST 14 (L) 02/20/2020   ALT 15 02/20/2020   PROT 7.0 03/25/2020   ALBUMIN 4.2 02/20/2020   CALCIUM 9.6 02/20/2020   GFRAA >60 02/20/2020   QFTBGOLDPLUS NEGATIVE 03/25/2020   March 25, 2020 UA negative, SPEP normal, immunoglobulins normal, TB Gold negative, hepatitis B-, hepatitis C negative, G6PD normal, TPMT normal, RF negative, anti-CCP negative, uric acid 6.7, CK 67, ESR 19   Speciality Comments: No specialty comments available.  Procedures:  No procedures performed Allergies: Patient has no known allergies.   Assessment / Plan:     Visit Diagnoses: Chronic inflammatory arthritis-history of a stage IIIb malignant melanoma with nodal mets and neuroendocrine carcinoma of the pancreas. Status post immunotherapy and chronic inflammatory arthritis after  immunotherapy. He continues to suffer from severe inflammatory arthritis. He has had recurrent aspiration of his left knee joint due to moderate to severe effusion. He has been on prednisone 10 mg p.o. daily. We had detailed discussion regarding different medication options at the last visit. A handout on methotrexate was given. Patient states he has discussed use of methotrexate with Dr. Myna Hidalgo who is in agreement to proceed with methotrexate. My plan is to start him on methotrexate 0.6 mL subcu weekly and increase it to 0.8 mL subcu weekly after 2 weeks if the labs are stable. We will check labs 2 weeks x 2 and then every 3 months to monitor for drug toxicity. He will also need folic acid 2  mg p.o. daily. I have advised him to stay on prednisone 15 mg p.o. daily until the follow-up visit.  Drug Counseling TB Gold: 03/25/2020 Hepatitis panel: 03/25/2020  Chest-xray: Pending  Contraception: Wife has had tubal ligation.  Alcohol use: None  Patient was counseled on the purpose, proper use, and adverse effects of methotrexate including nausea, infection, and signs and symptoms of pneumonitis.  Reviewed instructions with patient to take methotrexate weekly along with folic acid daily.  Discussed the importance of frequent monitoring of kidney and liver function and blood counts, and provided patient with standing lab instructions.  Counseled patient to avoid NSAIDs and alcohol while on methotrexate.  Provided patient with educational materials on methotrexate and answered all questions.  Advised patient to get annual influenza vaccine and to get a pneumococcal vaccine if patient has not already had one.  Patient voiced understanding.  Patient consented to methotrexate use.  Will upload into chart.     High risk medication use-he will be starting methotrexate and folic acid and continue prednisone 15 mg p.o. daily until follow-up in 6 weeks. I plan to taper prednisone after that.  Primary osteoarthritis  of both hands-he has overlap of osteoarthritis and severe inflammatory arthritis. The x-ray showed some changes in the carpal bones at the last visit.  Primary osteoarthritis of both knees-x-rays obtained at the last visit showed bilateral moderate to severe osteoarthritis and moderate chondromalacia patella. He also has ongoing warmth and swelling in his bilateral knee joints and moderate to severe effusion in his left knee joint. He has had multiple aspirations of his left knee joint palpation.  Primary osteoarthritis of both feet-x-ray findings are consistent with postsurgical changes and osteoarthritis.  Other fatigue-he has persistent fatigue and myalgias. His CK is normal.  Adrenal insufficiency ([place]) - He is on hydrocortisone 5 mg p.o. twice daily  Malignant melanoma of right great toe Stage IIIB ([id number])-status post immunotherapy by Dr. Marin Olp. Right big toe amputation.  Neuroendocrine carcinoma of pancreas (H[facility])-status post chemotherapy.  Other medical problems include the following:  Paroxysmal supraventricular tachycardia ([place])  History of hyperlipidemia  Bilateral pulmonary embolism ([facility])-treated with IV heparin and did not require long-term anticoagulation.  Drug-induced pneumonitis - VERVOY  Obstructive sleep apnea  Educate about COVID-19 virus infection-patient was advised by Dr. Marin Olp to not to take vaccination against COVID-19. Use of mask, social distancing and hand hygiene was discussed.    Orders: Orders Placed This Encounter  Procedures  . DG Chest 2 View   Meds ordered this encounter  Medications  . folic acid (FOLVITE) 1 MG tablet    Sig: Take 2 tablets (2 mg total) by mouth daily.    Dispense:  180 tablet    Refill:  2  . Tuberculin-Allergy Syringes 27G X 1/2" 1 ML MISC    Sig: Use 1 syringe once weekly to inject methotrexate.    Dispense:  12 each    Refill:  2  . methotrexate 50 MG/2ML injection    Sig: Inject 0.38mL into the skin once  weekly, if labs are stable increase to 0.31mL into the skin once weekly.    Dispense:  4 mL    Refill:  0      Follow-Up Instructions: Return in about 6 weeks (around 05/20/2020) for Chronic inflammatory arthritis.   Bo Merino, MD  Note - This record has been created using Editor, commissioning.  Chart creation errors have been sought, but may not always  have been located. Such creation errors do not  reflect on  the standard of medical care.

## 2020-04-03 NOTE — Progress Notes (Signed)
I will discuss results at the follow-up visit.

## 2020-04-08 ENCOUNTER — Ambulatory Visit (HOSPITAL_COMMUNITY)
Admission: RE | Admit: 2020-04-08 | Discharge: 2020-04-08 | Disposition: A | Discharge status: Home / Self Care | Payer: PPO | Source: Ambulatory Visit | Attending: Rheumatology | Admitting: Rheumatology

## 2020-04-08 ENCOUNTER — Other Ambulatory Visit: Payer: Self-pay

## 2020-04-08 ENCOUNTER — Ambulatory Visit: Payer: PPO | Admitting: Rheumatology

## 2020-04-08 ENCOUNTER — Encounter: Payer: Self-pay | Admitting: Rheumatology

## 2020-04-08 VITALS — BP 117/81 | HR 71 | Resp 17 | Ht 72.0 in | Wt 243.6 lb

## 2020-04-08 DIAGNOSIS — C4371 Malignant melanoma of right lower limb, including hip: Secondary | ICD-10-CM | POA: Diagnosis not present

## 2020-04-08 DIAGNOSIS — R5383 Other fatigue: Secondary | ICD-10-CM

## 2020-04-08 DIAGNOSIS — M19072 Primary osteoarthritis, left ankle and foot: Secondary | ICD-10-CM

## 2020-04-08 DIAGNOSIS — M19041 Primary osteoarthritis, right hand: Secondary | ICD-10-CM | POA: Diagnosis not present

## 2020-04-08 DIAGNOSIS — J9811 Atelectasis: Secondary | ICD-10-CM | POA: Diagnosis not present

## 2020-04-08 DIAGNOSIS — C7A8 Other malignant neuroendocrine tumors: Secondary | ICD-10-CM

## 2020-04-08 DIAGNOSIS — M1990 Unspecified osteoarthritis, unspecified site: Secondary | ICD-10-CM | POA: Diagnosis not present

## 2020-04-08 DIAGNOSIS — I2699 Other pulmonary embolism without acute cor pulmonale: Secondary | ICD-10-CM

## 2020-04-08 DIAGNOSIS — I471 Supraventricular tachycardia: Secondary | ICD-10-CM

## 2020-04-08 DIAGNOSIS — G4733 Obstructive sleep apnea (adult) (pediatric): Secondary | ICD-10-CM

## 2020-04-08 DIAGNOSIS — Z79899 Other long term (current) drug therapy: Secondary | ICD-10-CM

## 2020-04-08 DIAGNOSIS — T50905A Adverse effect of unspecified drugs, medicaments and biological substances, initial encounter: Secondary | ICD-10-CM

## 2020-04-08 DIAGNOSIS — A419 Sepsis, unspecified organism: Secondary | ICD-10-CM

## 2020-04-08 DIAGNOSIS — M19071 Primary osteoarthritis, right ankle and foot: Secondary | ICD-10-CM

## 2020-04-08 DIAGNOSIS — Z7189 Other specified counseling: Secondary | ICD-10-CM

## 2020-04-08 DIAGNOSIS — M170 Bilateral primary osteoarthritis of knee: Secondary | ICD-10-CM | POA: Diagnosis not present

## 2020-04-08 DIAGNOSIS — M19042 Primary osteoarthritis, left hand: Secondary | ICD-10-CM

## 2020-04-08 DIAGNOSIS — R768 Other specified abnormal immunological findings in serum: Secondary | ICD-10-CM

## 2020-04-08 DIAGNOSIS — Z8639 Personal history of other endocrine, nutritional and metabolic disease: Secondary | ICD-10-CM | POA: Diagnosis not present

## 2020-04-08 DIAGNOSIS — E2740 Unspecified adrenocortical insufficiency: Secondary | ICD-10-CM | POA: Diagnosis not present

## 2020-04-08 DIAGNOSIS — J9601 Acute respiratory failure with hypoxia: Secondary | ICD-10-CM

## 2020-04-08 DIAGNOSIS — I5032 Chronic diastolic (congestive) heart failure: Secondary | ICD-10-CM

## 2020-04-08 DIAGNOSIS — J189 Pneumonia, unspecified organism: Secondary | ICD-10-CM

## 2020-04-08 LAB — PROTEIN ELECTROPHORESIS, SERUM, WITH REFLEX
Albumin ELP: 4.3 g/dL (ref 3.8–4.8)
Alpha 1: 0.3 g/dL (ref 0.2–0.3)
Alpha 2: 0.7 g/dL (ref 0.5–0.9)
Beta 2: 0.3 g/dL (ref 0.2–0.5)
Beta Globulin: 0.4 g/dL (ref 0.4–0.6)
Gamma Globulin: 0.9 g/dL (ref 0.8–1.7)
Total Protein: 7 g/dL (ref 6.1–8.1)

## 2020-04-08 LAB — HEPATITIS B SURFACE ANTIGEN: Hepatitis B Surface Ag: NONREACTIVE

## 2020-04-08 LAB — QUANTIFERON-TB GOLD PLUS
Mitogen-NIL: 5.55 IU/mL
NIL: 0.02 IU/mL
QuantiFERON-TB Gold Plus: NEGATIVE
TB1-NIL: 0 IU/mL
TB2-NIL: 0 IU/mL

## 2020-04-08 LAB — CK: Total CK: 37 U/L — ABNORMAL LOW (ref 44–196)

## 2020-04-08 LAB — URINALYSIS, ROUTINE W REFLEX MICROSCOPIC
Bilirubin Urine: NEGATIVE
Glucose, UA: NEGATIVE
Hgb urine dipstick: NEGATIVE
Ketones, ur: NEGATIVE
Leukocytes,Ua: NEGATIVE
Nitrite: NEGATIVE
Protein, ur: NEGATIVE
Specific Gravity, Urine: 1.022 (ref 1.001–1.03)
pH: 5.5 (ref 5.0–8.0)

## 2020-04-08 LAB — SEDIMENTATION RATE: Sed Rate: 19 mm/h (ref 0–20)

## 2020-04-08 LAB — 14-3-3 ETA PROTEIN: 14-3-3 eta Protein: <0.2 ng/mL (ref <0.2)

## 2020-04-08 LAB — CYCLIC CITRUL PEPTIDE ANTIBODY, IGG: Cyclic Citrullin Peptide Ab: <16 UNITS

## 2020-04-08 LAB — THIOPURINE METHYLTRANSFERASE (TPMT), RBC: Thiopurine Methyltransferase, RBC: 14 nmol/hr/mL RBC

## 2020-04-08 LAB — HEPATITIS B CORE ANTIBODY, IGM: Hep B C IgM: NONREACTIVE

## 2020-04-08 LAB — HEPATITIS C ANTIBODY
Hepatitis C Ab: NONREACTIVE
SIGNAL TO CUT-OFF: 0.01 (ref <1.00)

## 2020-04-08 LAB — IGG, IGA, IGM
IgG (Immunoglobin G), Serum: 1046 mg/dL (ref 600–1640)
IgM, Serum: 64 mg/dL (ref 50–300)
Immunoglobulin A: 115 mg/dL (ref 47–310)

## 2020-04-08 LAB — URIC ACID: Uric Acid, Serum: 6.7 mg/dL (ref 4.0–8.0)

## 2020-04-08 LAB — GLUCOSE 6 PHOSPHATE DEHYDROGENASE: G-6PDH: 14.1 U/g Hgb (ref 7.0–20.5)

## 2020-04-08 LAB — RHEUMATOID FACTOR: Rheumatoid fact SerPl-aCnc: <14 IU/mL (ref <14)

## 2020-04-08 MED ORDER — METHOTREXATE SODIUM CHEMO INJECTION 50 MG/2ML: INTRAMUSCULAR | 0 refills | Status: DC

## 2020-04-08 MED ORDER — FOLIC ACID 1 MG PO TABS: 2.0000 mg | ORAL_TABLET | Freq: Every day | ORAL | 2 refills | Status: DC

## 2020-04-08 MED ORDER — "TUBERCULIN-ALLERGY SYRINGES 27G X 1/2"" 1 ML MISC": 2 refills | Status: DC

## 2020-04-08 NOTE — Patient Instructions (Addendum)
Standing Labs We placed an order today for your standing lab work.   Please have your standing labs drawn in 2 weeks x2 and then every 3 months.   If possible, please have your labs drawn 2 weeks prior to your appointment so that the provider can discuss your results at your appointment.  We have open lab daily Monday through Thursday from 8:30-12:30 PM and 1:30-4:30 PM and Friday from 8:30-12:30 PM and 1:30-4:00 PM at the office of Dr. Bo Merino, [facility].   Please be advised, patients with office appointments requiring lab work will take precedents over walk-in lab work.  If possible, please come for your lab work on Monday and Friday afternoons, as you may experience shorter wait times. The office is located at [address] [place], [place], [place] [address] No appointment is necessary.   Labs are drawn by [facility]. Please bring your co-pay at the time of your lab draw.  You may receive a bill from [facility] for your lab work.  If you wish to have your labs drawn at another location, please call the office 24 hours in advance to send orders.  If you have any questions regarding directions or hours of operation,  please call [phone].   As a reminder, please drink plenty of water prior to coming for your lab work. Thanks!    Methotrexate injection What is this medicine? METHOTREXATE (METH oh TREX ate) is a chemotherapy drug used to treat cancer including breast cancer, leukemia, and lymphoma. This medicine can also be used to treat psoriasis and certain kinds of arthritis. This medicine may be used for other purposes; ask your health care provider or pharmacist if you have questions. What should I tell my health care provider before I take this medicine? They need to know if you have any of these conditions:  fluid in the stomach area or lungs  if you often drink alcohol  infection or immune system problems  kidney disease  liver disease  low blood  counts, like low white cell, platelet, or red cell counts  lung disease  radiation therapy  stomach ulcers  ulcerative colitis  an unusual or allergic reaction to methotrexate, other medicines, foods, dyes, or preservatives  pregnant or trying to get pregnant  breast-feeding How should I use this medicine? This medicine is for infusion into a vein or for injection into muscle or into the spinal fluid (whichever applies). It is usually given by a health care professional in a hospital or clinic setting. In rare cases, you might get this medicine at home. You will be taught how to give this medicine. Use exactly as directed. Take your medicine at regular intervals. Do not take your medicine more often than directed. If this medicine is used for arthritis or psoriasis, it should be taken weekly, NOT daily. It is important that you put your used needles and syringes in a special sharps container. Do not put them in a trash can. If you do not have a sharps container, call your pharmacist or healthcare provider to get one. Talk to your pediatrician regarding the use of this medicine in children. While this drug may be prescribed for children as young as 2 years for selected conditions, precautions do apply. Overdosage: If you think you have taken too much of this medicine contact a poison control center or emergency room at once. NOTE: This medicine is only for you. Do not share this medicine with others. What if I miss a  dose? It is important not to miss your dose. Call your doctor or health care professional if you are unable to keep an appointment. If you give yourself the medicine and you miss a dose, talk with your doctor or health care professional. Do not take double or extra doses. What may interact with this medicine? This medicine may interact with the following medications:  acitretin  aspirin or aspirin-like medicines including salicylates  azathioprine  certain antibiotics  like chloramphenicol, penicillin, tetracycline  certain medicines for stomach problems like esomeprazole, omeprazole, pantoprazole  cyclosporine  gold  hydroxychloroquine  live virus vaccines  mercaptopurine  NSAIDs, medicines for pain and inflammation, like ibuprofen or naproxen  other cytotoxic agents  penicillamine  phenylbutazone  phenytoin  probenacid  retinoids such as isotretinoin and tretinoin  steroid medicines like prednisone or cortisone  sulfonamides like sulfasalazine and trimethoprim/sulfamethoxazole  theophylline This list may not describe all possible interactions. Give your health care provider a list of all the medicines, herbs, non-prescription drugs, or dietary supplements you use. Also tell them if you smoke, drink alcohol, or use illegal drugs. Some items may interact with your medicine. What should I watch for while using this medicine? Avoid alcoholic drinks. In some cases, you may be given additional medicines to help with side effects. Follow all directions for their use. This medicine can make you more sensitive to the sun. Keep out of the sun. If you cannot avoid being in the sun, wear protective clothing and use sunscreen. Do not use sun lamps or tanning beds/booths. You may get drowsy or dizzy. Do not drive, use machinery, or do anything that needs mental alertness until you know how this medicine affects you. Do not stand or sit up quickly, especially if you are an older patient. This reduces the risk of dizzy or fainting spells. You may need blood work done while you are taking this medicine. Call your doctor or health care professional for advice if you get a fever, chills or sore throat, or other symptoms of a cold or flu. Do not treat yourself. This drug decreases your body's ability to fight infections. Try to avoid being around people who are sick. This medicine may increase your risk to bruise or bleed. Call your doctor or health care  professional if you notice any unusual bleeding. Check with your doctor or health care professional if you get an attack of severe diarrhea, nausea and vomiting, or if you sweat a lot. The loss of too much body fluid can make it dangerous for you to take this medicine. Talk to your doctor about your risk of cancer. You may be more at risk for certain types of cancers if you take this medicine. Both men and women must use effective birth control with this medicine. Do not become pregnant while taking this medicine or until at least 1 normal menstrual cycle has occurred after stopping it. Women should inform their doctor if they wish to become pregnant or think they might be pregnant. Men should not father a child while taking this medicine and for 3 months after stopping it. There is a potential for serious side effects to an unborn child. Talk to your health care professional or pharmacist for more information. Do not breast-feed an infant while taking this medicine. What side effects may I notice from receiving this medicine? Side effects that you should report to your doctor or health care professional as soon as possible:  allergic reactions like skin rash, itching or hives,  swelling of the face, lips, or tongue  back pain  breathing problems or shortness of breath  confusion  diarrhea  dry, nonproductive cough  low blood counts - this medicine may decrease the number of white blood cells, red blood cells and platelets. You may be at increased risk of infections and bleeding  mouth sores  redness, blistering, peeling or loosening of the skin, including inside the mouth  seizures  severe headaches  signs of infection - fever or chills, cough, sore throat, pain or difficulty passing urine  signs and symptoms of bleeding such as bloody or black, tarry stools; red or dark-brown urine; spitting up blood or brown material that looks like coffee grounds; red spots on the skin; unusual  bruising or bleeding from the eye, gums, or nose  signs and symptoms of kidney injury like trouble passing urine or change in the amount of urine  signs and symptoms of liver injury like dark yellow or brown urine; general ill feeling or flu-like symptoms; light-colored stools; loss of appetite; nausea; right upper belly pain; unusually weak or tired; yellowing of the eyes or skin  stiff neck  vomiting Side effects that usually do not require medical attention (report to your doctor or health care professional if they continue or are bothersome):  dizziness  hair loss  headache  stomach pain  upset stomach This list may not describe all possible side effects. Call your doctor for medical advice about side effects. You may report side effects to FDA at 1-800-FDA-1088. Where should I keep my medicine? If you are using this medicine at home, you will be instructed on how to store this medicine. Throw away any unused medicine after the expiration date on the label. NOTE: This sheet is a summary. It may not cover all possible information. If you have questions about this medicine, talk to your doctor, pharmacist, or health care provider.  2020 Elsevier/Gold Standard (2017-01-26 13:31:42)   COVID-19 vaccine recommendations:   COVID-19 vaccine is recommended for everyone (unless you are allergic to a vaccine component), even if you are on a medication that suppresses your immune system.   If you are on Methotrexate, Cellcept (mycophenolate), Rinvoq, Morrie Sheldon, and Olumiant- hold the medication for 1 week after each vaccine. Hold Methotrexate for 2 weeks after the single dose COVID-19 vaccine.   If you are on Orencia subcutaneous injection - hold medication one week prior to and one week after the first COVID-19 vaccine dose (only).   If you are on Orencia IV infusions- time vaccination administration so that the first COVID-19 vaccination will occur four weeks after the infusion and  postpone the subsequent infusion by one week.   If you are on Cyclophosphamide or Rituxan infusions please contact your doctor prior to receiving the COVID-19 vaccine.   Do not take Tylenol or any anti-inflammatory medications (NSAIDs) 24 hours prior to the COVID-19 vaccination.   There is no direct evidence about the efficacy of the COVID-19 vaccine in individuals who are on medications that suppress the immune system.   Even if you are fully vaccinated, and you are on any medications that suppress your immune system, please continue to wear a mask, maintain at least six feet social distance and practice hand hygiene.   If you develop a COVID-19 infection, please contact your PCP or our office to determine if you need antibody infusion.  The booster vaccine is now available for immunocompromised patients. It is advised that if you had Pfizer vaccine you should  get [employer] booster.  If you had a Moderna vaccine then you should get a Moderna booster. Johnson and Wynetta Emery does not have a booster vaccine at this time.  Please see the following web sites for updated information.   [email or web address]

## 2020-04-08 NOTE — Progress Notes (Signed)
Mild scarring was noted in the lung bases.  Otherwise unremarkable.

## 2020-04-10 ENCOUNTER — Telehealth: Payer: Self-pay | Admitting: Rheumatology

## 2020-04-10 NOTE — Telephone Encounter (Signed)
Patient advised we have not received a decision in regard to his Methotrexate at this time. Patient advised we will probably not hear anything until Monday from the insurance company.

## 2020-04-10 NOTE — Telephone Encounter (Signed)
Patient left a message stating we are trying to get approval for him to start MTX injections. Patient received word from insurance that they have denied coverage. Patient wants to check status of that authorization. Please call patient with an update.

## 2020-04-13 ENCOUNTER — Telehealth: Payer: Self-pay | Admitting: Rheumatology

## 2020-04-13 NOTE — Telephone Encounter (Signed)
This is in reference to mtx PA

## 2020-04-13 NOTE — Telephone Encounter (Signed)
[employer] calling in reference to PA request for patient. Please use Reference # [id number]

## 2020-04-13 NOTE — Telephone Encounter (Signed)
Received notification from [facility] regarding a prior authorization for Methotrexate. Authorization has been APPROVED from 04/13/2020 to 04/12/2120.

## 2020-04-18 DIAGNOSIS — E271 Primary adrenocortical insufficiency: Secondary | ICD-10-CM | POA: Diagnosis not present

## 2020-04-18 DIAGNOSIS — I9589 Other hypotension: Secondary | ICD-10-CM | POA: Diagnosis not present

## 2020-04-22 ENCOUNTER — Ambulatory Visit: Payer: PPO | Admitting: Rheumatology

## 2020-04-23 ENCOUNTER — Telehealth: Payer: Self-pay

## 2020-04-23 DIAGNOSIS — Z79899 Other long term (current) drug therapy: Secondary | ICD-10-CM | POA: Diagnosis not present

## 2020-04-23 NOTE — Telephone Encounter (Signed)
Lab Orders released.  

## 2020-04-23 NOTE — Telephone Encounter (Signed)
Patient requested labwork orders be sent to [facility] in [place].  Patient is going this morning 04/23/20.

## 2020-04-24 LAB — CMP14+EGFR
ALT: 17 IU/L (ref 0–44)
AST: 17 IU/L (ref 0–40)
Albumin/Globulin Ratio: 1.8 (ref 1.2–2.2)
Albumin: 4.6 g/dL (ref 3.8–4.9)
Alkaline Phosphatase: 99 IU/L (ref 44–121)
BUN/Creatinine Ratio: 16 (ref 9–20)
BUN: 17 mg/dL (ref 6–24)
Bilirubin Total: 0.5 mg/dL (ref 0.0–1.2)
CO2: 24 mmol/L (ref 20–29)
Calcium: 9.3 mg/dL (ref 8.7–10.2)
Chloride: 103 mmol/L (ref 96–106)
Creatinine, Ser: 1.06 mg/dL (ref 0.76–1.27)
GFR calc Af Amer: 92 mL/min/{1.73_m2} (ref >59)
GFR calc non Af Amer: 80 mL/min/{1.73_m2} (ref >59)
Globulin, Total: 2.5 g/dL (ref 1.5–4.5)
Glucose: 93 mg/dL (ref 65–99)
Potassium: 4.7 mmol/L (ref 3.5–5.2)
Sodium: 143 mmol/L (ref 134–144)
Total Protein: 7.1 g/dL (ref 6.0–8.5)

## 2020-04-24 LAB — CBC WITH DIFFERENTIAL/PLATELET
Basophils Absolute: 0 10*3/uL (ref 0.0–0.2)
Basos: 0 %
EOS (ABSOLUTE): 0.4 10*3/uL (ref 0.0–0.4)
Eos: 7 %
Hematocrit: 44.6 % (ref 37.5–51.0)
Hemoglobin: 14.9 g/dL (ref 13.0–17.7)
Immature Grans (Abs): 0 10*3/uL (ref 0.0–0.1)
Immature Granulocytes: 0 %
Lymphocytes Absolute: 1.2 10*3/uL (ref 0.7–3.1)
Lymphs: 24 %
MCH: 31.2 pg (ref 26.6–33.0)
MCHC: 33.4 g/dL (ref 31.5–35.7)
MCV: 93 fL (ref 79–97)
Monocytes Absolute: 0.5 10*3/uL (ref 0.1–0.9)
Monocytes: 10 %
Neutrophils Absolute: 2.9 10*3/uL (ref 1.4–7.0)
Neutrophils: 59 %
Platelets: 228 10*3/uL (ref 150–450)
RBC: 4.78 x10E6/uL (ref 4.14–5.80)
RDW: 13 % (ref 11.6–15.4)
WBC: 5 10*3/uL (ref 3.4–10.8)

## 2020-04-24 NOTE — Telephone Encounter (Signed)
CBC and CMP normal

## 2020-04-29 ENCOUNTER — Other Ambulatory Visit: Payer: Self-pay | Admitting: Hematology & Oncology

## 2020-05-04 ENCOUNTER — Ambulatory Visit (HOSPITAL_COMMUNITY): Admission: RE | Admit: 2020-05-04 | Payer: PPO | Source: Ambulatory Visit

## 2020-05-08 NOTE — Progress Notes (Signed)
Office Visit Note  Patient: James Byrd             Date of Birth: Nov 07, 1965           MRN: [id number]             PCP: Emmaline Kluver, MD Referring: Emmaline Kluver, * Visit Date: 05/22/2020 Occupation: @GUAROCC @  Subjective:  Other (patient has noticed improvement on MTX but reports fatigue and nausea)   History of Present Illness: James Byrd is a 54 y.o. male with history of inflammatory arthritis induced after immunotherapy.  He was a started on methotrexate on April 16, 2020.  He has noticed remarkable improvement in the joint swelling.  He has been off prednisone for approximately 1 month now.  He states he continues to have discomfort in his knee joints.  None of the other joints are painful or swollen.  He has been experiencing nausea and fatigue with methotrexate.  He has been taking folic acid only 1 tablet a day.  He plans to increase it to 2 tablets a day.  Activities of Daily Living:  Patient reports morning stiffness for 0 minutes.   Patient Denies nocturnal pain.  Difficulty dressing/grooming: Denies Difficulty climbing stairs: Reports Difficulty getting out of chair: Reports Difficulty using hands for taps, buttons, cutlery, and/or writing: Denies  Review of Systems  Constitutional: Positive for fatigue.  HENT: Negative for mouth sores, mouth dryness and nose dryness.   Eyes: Negative for pain, itching and dryness.  Respiratory: Negative for shortness of breath and difficulty breathing.   Cardiovascular: Negative for chest pain and palpitations.  Gastrointestinal: Positive for nausea and vomiting. Negative for blood in stool, constipation and diarrhea.  Endocrine: Positive for increased urination.  Genitourinary: Negative for difficulty urinating.  Musculoskeletal: Positive for arthralgias, joint pain and joint swelling. Negative for myalgias, morning stiffness, muscle tenderness and myalgias.  Skin: Negative for color change, rash and  redness.  Allergic/Immunologic: Negative for susceptible to infections.  Neurological: Positive for numbness and memory loss. Negative for dizziness and headaches.  Hematological: Positive for bruising/bleeding tendency.  Psychiatric/Behavioral: Negative for confusion.    PMFS History:  Patient Active Problem List   Diagnosis Date Noted  . Pain in both feet 10/17/2019  . Bilateral pulmonary embolism ([place]) 08/26/2019  . Neuroendocrine carcinoma of pancreas ([place]) 05/22/2019  . Severe sepsis with septic shock ([place]) 01/15/2019  . Acute respiratory failure with hypoxia ([place]) 01/15/2019  . AKI (acute kidney injury) ([place]) 01/15/2019  . Septic shock ([place]) 01/13/2019  . Goals of care, counseling/discussion 12/17/2018  . Adrenal insufficiency ([place])   . Refractory nausea and vomiting   . Paranasal sinus disease   . Acute renal failure syndrome ([place])   . HCAP (healthcare-associated pneumonia) 03/06/2015  . Drug-induced pneumonitis - VERVOY   . Thrombocytopenia ([place]) 02/09/2015  . HLD (hyperlipidemia)   . Chronic diastolic congestive heart failure ([place])   . Malignant melanoma of right great toe Stage IIIB ([id number]) 11/14/2014  . Obstructive sleep apnea 07/19/2012  . Paroxysmal supraventricular tachycardia ([place]) 05/24/2012  . Chest pressure 05/24/2012  . Palpitations 05/24/2012  . Anxiety 05/24/2012    Past Medical History:  Diagnosis Date  . Adrenal insufficiency ([place])   . Anxiety 05/24/2012  . Complication of anesthesia    SLO TO AWAKEN MANY 8 TO 9 YRS AGO, NO ISSUES SINCE  . Goals of care, counseling/discussion 12/17/2018  . Gout   . History of kidney stones   . HLD (hyperlipidemia)   .  Malignant melanoma of right great toe ([place]) 11/14/2014  . Neuroendocrine carcinoma of pancreas ([place]) 05/22/2019  . Paroxysmal supraventricular tachycardia ([place]) 05/24/2012   Described in the treadmill report; details are pending   . PE (pulmonary thromboembolism) ([place]) 08/2019  . Rheumatoid arthritis  ([place])   . Sleep apnea    MILD NO CPAP NEEDED    Family History  Adopted: Yes  Problem Relation Age of Onset  . Stroke Mother   . COPD Father   . Diabetes Father   . Diabetes Sister   . Diabetes Brother    Past Surgical History:  Procedure Laterality Date  . CHOLECYSTECTOMY    . ESOPHAGOGASTRODUODENOSCOPY (EGD) WITH PROPOFOL N/A 05/13/2019   Procedure: ESOPHAGOGASTRODUODENOSCOPY (EGD) WITH PROPOFOL;  Surgeon: Arta Silence, MD;  Location: [facility];  Service: Endoscopy;  Laterality: N/A;  . EXTRACORPOREAL SHOCK WAVE LITHOTRIPSY  2016  . FINE NEEDLE ASPIRATION N/A 05/13/2019   Procedure: FINE NEEDLE ASPIRATION (FNA) LINEAR;  Surgeon: Arta Silence, MD;  Location: [facility];  Service: Endoscopy;  Laterality: N/A;  . LYMPH NODE BIOPSY Right 11/06/2018   Procedure: RIGHT INGUINAL LYMPH NODE BIOPSY AND POSSIBLE RIGHT GROIN NODE DISECTION;  Surgeon: Alphonsa Overall, MD;  Location: [facility];  Service: General;  Laterality: Right;  . r toe partial ambutation    . ROTATOR CUFF REPAIR Right 2010   3 yrs ago  . UPPER ESOPHAGEAL ENDOSCOPIC ULTRASOUND (EUS) N/A 05/13/2019   Procedure: UPPER ESOPHAGEAL ENDOSCOPIC ULTRASOUND (EUS);  Surgeon: Arta Silence, MD;  Location: [facility];  Service: Endoscopy;  Laterality: N/A;   Social History   Social History Narrative   Lives with wife in a one story home.  Has 2 children.  On disability.  Education: high school.+   Immunization History  Administered Date(s) Administered  . Influenza-Unspecified 04/03/2014     Objective: Vital Signs: BP 115/81 (BP Location: Right Arm, Patient Position: Sitting, Cuff Size: Normal)   Pulse 79   Resp 16   Ht 6' (1.829 m)   Wt 246 lb (111.6 kg)   BMI 33.36 kg/m    Physical Exam Vitals and nursing note reviewed.  Constitutional:      Appearance: He is well-developed.  HENT:     Head: Normocephalic and atraumatic.  Eyes:     Conjunctiva/sclera: Conjunctivae normal.     Pupils: Pupils are equal,  round, and reactive to light.  Cardiovascular:     Rate and Rhythm: Normal rate and regular rhythm.     Heart sounds: Normal heart sounds.  Pulmonary:     Effort: Pulmonary effort is normal.     Breath sounds: Normal breath sounds.  Abdominal:     General: Bowel sounds are normal.     Palpations: Abdomen is soft.  Musculoskeletal:     Cervical back: Normal range of motion and neck supple.  Skin:    General: Skin is warm and dry.     Capillary Refill: Capillary refill takes less than 2 seconds.  Neurological:     Mental Status: He is alert and oriented to person, place, and time.  Psychiatric:        Behavior: Behavior normal.      Musculoskeletal Exam: He has some stiffness range of motion of the cervical spine.  Shoulder joints and elbow joints with good range of motion.  Some warmth also noted over the right elbow.  He had synovial thickening and some tenderness on the right wrist joint.  No synovitis was noted over MCPs or PIPs.  CDAI Exam: CDAI Score: 7.7  Patient Global: 3 mm; Provider Global: 4 mm Swollen: 3 ; Tender: 4  Joint Exam 05/22/2020      Right  Left  Elbow  Swollen Tender     Wrist   Tender     Knee  Swollen Tender  Swollen Tender     Investigation: No additional findings.  Imaging: NM PET Image Restage (PS) Whole Body  Result Date: 05/18/2020 CLINICAL DATA:  Subsequent treatment strategy for melanoma involving the right great toe. Prior surgery, radiation therapy, and immunotherapy. EXAM: NUCLEAR MEDICINE PET WHOLE BODY TECHNIQUE: 12.2 mCi F-18 FDG was injected intravenously. Full-ring PET imaging was performed from the head to foot after the radiotracer. CT data was obtained and used for attenuation correction and anatomic localization. Fasting blood glucose: 84 mg/dl COMPARISON:  Multiple exams, including 02/13/2020 FINDINGS: Mediastinal blood pool activity: SUV max 2.8 HEAD/NECK: Symmetric palatine tonsillar activity is again observed and likely  physiologic. No significant abnormal hypermetabolic activity in this region. Incidental CT findings: none CHEST: No significant abnormal hypermetabolic activity in this region. Incidental CT findings: Atherosclerotic calcification of the aortic arch. Mild scarring in both lower lobes. ABDOMEN/PELVIS: A 0.7 cm in short axis soft tissue density nodule along the lateral margin of the left gluteus maximus muscle in the subcutaneous tissues on image 214 of series 4 has a maximum SUV of 2.6 (previously 1.8) this remains less than blood pool activity, and previously measured 0.7 cm in short axis on image 02/13/20. Smaller subcutaneous nodules along the right buttock region are stable and not metabolic. Incidental CT findings: Photopenic cysts in the liver. Cholecystectomy. Small retroperitoneal lymph nodes are not pathologically enlarged. Photopenic lesion along the left mid kidney medially is probably a cyst. Postoperative findings in the right groin region. SKELETON: Degenerative activity at the left [facility] joint. Incidental CT findings: Chronic AVN along the knees. EXTREMITIES: Subtle activity along the interosseous muscles in the right foot without a discrete mass identified, likely physiologic/incidental. Incidental CT findings: Small bilateral knee joint effusions. IMPRESSION: 1. No compelling findings of active malignancy. 2. There is a tiny soft tissue density nodule along the medial margin of the left gluteus maximus muscle with a maximum SUV of 2.6, which is just below the blood pool activity. This measures 0.7 cm in short axis which is stable. This is probably a small benign lymph node but merits surveillance. Similar smaller more superficial subcutaneous nodules along the right buttock region are stable and not significantly metabolic. 3. There is some low-grade activity in the interosseous muscles of the right foot without a discrete mass identified, probably physiologic/incidental. 4. Other imaging findings of  potential clinical significance: Aortic Atherosclerosis (ICD10-I70.0). Hepatic and left renal cysts. Chronic AVN along the knees. Bilateral knee joint effusions. Electronically Signed   By: Van Clines M.D.   On: 05/18/2020 13:31    Recent Labs: Lab Results  Component Value Date   WBC 3.4 (L) 05/21/2020   HGB 13.9 05/21/2020   PLT 208 05/21/2020   NA 140 05/21/2020   K 3.7 05/21/2020   CL 106 05/21/2020   CO2 27 05/21/2020   GLUCOSE 89 05/21/2020   BUN 18 05/21/2020   CREATININE 0.98 05/21/2020   BILITOT 0.5 05/21/2020   ALKPHOS 76 05/21/2020   AST 17 05/21/2020   ALT 17 05/21/2020   PROT 7.2 05/21/2020   ALBUMIN 4.5 05/21/2020   CALCIUM 9.6 05/21/2020   GFRAA 95 05/11/2020   QFTBGOLDPLUS NEGATIVE 03/25/2020    Speciality  Comments: No specialty comments available.  Procedures:  Large Joint Inj: bilateral knee on 05/22/2020 11:59 AM Indications: pain Details: 27 G 1.5 in needle, medial approach  Arthrogram: No  Medications (Right): 1.5 mL lidocaine 1 %; 40 mg triamcinolone acetonide 40 MG/ML Medications (Left): 1.5 mL lidocaine 1 %; 40 mg triamcinolone acetonide 40 MG/ML Outcome: tolerated well, no immediate complications Procedure, treatment alternatives, risks and benefits explained, specific risks discussed. Consent was given by the patient. Immediately prior to procedure a time out was called to verify the correct patient, procedure, equipment, support staff and site/side marked as required. Patient was prepped and draped in the usual sterile fashion.     Allergies: Patient has no known allergies.   Assessment / Plan:     Visit Diagnoses: Chronic inflammatory arthritis - history of a stage IIIb malignant melanoma with nodal mets and neuroendocrine carcinoma of the pancreas. Status post immunotherapy.  He has developed chronic inflammatory arthritis.  He was started on methotrexate at the last visit.  He has been on 0.8 mL subcu for the last 2 weeks.  He has  noticed continued improvement in his symptoms.  He has been off prednisone for 2 weeks.  He still have some discomfort and swelling in his joints on examination today.  I advised him to increase methotrexate to 1 mL subcu weekly.  He has been taking folic acid only 1 tablet p.o. daily.  Have advised him to increase folic acid to 2 mg p.o. daily.  He has been experiencing some fatigue.  We also discussed possible use of anti-TNF's if he has an adequate response to methotrexate.  High risk medication use - methotrexate 0.8 mL subcu weekly, folic acid 2 mg p.o. daily although he has been taking only 1 tablet a day.  Labs done yesterday by Dr. Marin Olp were stable.  He had mild neutropenia.  I will check labs again in 2 weeks after increasing the dose of methotrexate and then every 2 months to monitor for drug toxicity..   Primary osteoarthritis of both hands - he has overlap of osteoarthritis and severe inflammatory arthritis. The x-ray showed some changes in the carpal bones.  He had some tenderness and synovial thickening over the right wrist joint.  Right elbow joint was also swollen.  Primary osteoarthritis of both knees - x-rays showed bilateral moderate to severe osteoarthritis and moderate chondromalacia patella.  He had to have warmth and swelling in bilateral knee joints.  He has been having difficulty walking.  Today after informed consent was obtained bilateral knee joints was injected with cortisone.  He tolerated the procedure well.  Primary osteoarthritis of both feet-he currently does not have any discomfort in his feet.  Other fatigue-he complains of increased fatigue on methotrexate.  Adrenal insufficiency ([place]) - He is on hydrocortisone 5 mg p.o. twice daily  Malignant melanoma of right great toe Stage IIIB ([id number]) - status post immunotherapy by Dr. Marin Olp. Right big toe amputation.  Paroxysmal supraventricular tachycardia ([facility])  Neuroendocrine carcinoma of pancreas ([place]) - status  post chemotherapy.  History of hyperlipidemia  Obstructive sleep apnea  Bilateral pulmonary embolism ([place]) - treated with IV heparin and did not require long-term anticoagulation.  Drug-induced pneumonitis - VERVOY  Orders: Orders Placed This Encounter  Procedures  . Large Joint Inj   No orders of the defined types were placed in this encounter.   Follow-Up Instructions: Return in about 2 months (around 07/23/2020) for inflammatory arthritis.   Bo Merino, MD  Note -  This record has been created using [employer].  Chart creation errors have been sought, but may not always  have been located. Such creation errors do not reflect on  the standard of medical care.

## 2020-05-10 ENCOUNTER — Other Ambulatory Visit: Payer: Self-pay | Admitting: Rheumatology

## 2020-05-11 ENCOUNTER — Other Ambulatory Visit: Payer: Self-pay | Admitting: *Deleted

## 2020-05-11 DIAGNOSIS — Z79899 Other long term (current) drug therapy: Secondary | ICD-10-CM | POA: Diagnosis not present

## 2020-05-11 NOTE — Progress Notes (Signed)
Patient requested lab orders to be released for [employer]. Patient will be updating labs today.

## 2020-05-11 NOTE — Telephone Encounter (Signed)
Last Visit: 04/08/2020  Next Visit: 05/22/2020 Labs: 04/23/2020 CBC and CMP normal.  Current Dose per office note 04/08/2020: methotrexate 0.6 mL subcu weekly and increase it to 0.8 mL subcu weekly after 2 weeks if the labs are stable. DX: Chronic inflammatory arthritis-history of a stage IIIb malignant melanoma with nodal mets and neuroendocrine carcinoma of the pancreas  Patient states he has increased to MTX 0.8 mL weekly. Patient updating labs today.   Okay to refill MTX?

## 2020-05-12 LAB — CMP14+EGFR
ALT: 27 IU/L (ref 0–44)
AST: 25 IU/L (ref 0–40)
Albumin/Globulin Ratio: 2 (ref 1.2–2.2)
Albumin: 4.6 g/dL (ref 3.8–4.9)
Alkaline Phosphatase: 81 IU/L (ref 44–121)
BUN/Creatinine Ratio: 15 (ref 9–20)
BUN: 15 mg/dL (ref 6–24)
Bilirubin Total: 0.4 mg/dL (ref 0.0–1.2)
CO2: 27 mmol/L (ref 20–29)
Calcium: 9.4 mg/dL (ref 8.7–10.2)
Chloride: 107 mmol/L — ABNORMAL HIGH (ref 96–106)
Creatinine, Ser: 1.03 mg/dL (ref 0.76–1.27)
GFR calc Af Amer: 95 mL/min/{1.73_m2} (ref >59)
GFR calc non Af Amer: 83 mL/min/{1.73_m2} (ref >59)
Globulin, Total: 2.3 g/dL (ref 1.5–4.5)
Glucose: 99 mg/dL (ref 65–99)
Potassium: 4 mmol/L (ref 3.5–5.2)
Sodium: 144 mmol/L (ref 134–144)
Total Protein: 6.9 g/dL (ref 6.0–8.5)

## 2020-05-12 LAB — CBC WITH DIFFERENTIAL/PLATELET
Basophils Absolute: 0 10*3/uL (ref 0.0–0.2)
Basos: 0 %
EOS (ABSOLUTE): 0.3 10*3/uL (ref 0.0–0.4)
Eos: 8 %
Hematocrit: 41.2 % (ref 37.5–51.0)
Hemoglobin: 13.9 g/dL (ref 13.0–17.7)
Immature Grans (Abs): 0 10*3/uL (ref 0.0–0.1)
Immature Granulocytes: 1 %
Lymphocytes Absolute: 1 10*3/uL (ref 0.7–3.1)
Lymphs: 28 %
MCH: 31.2 pg (ref 26.6–33.0)
MCHC: 33.7 g/dL (ref 31.5–35.7)
MCV: 93 fL (ref 79–97)
Monocytes Absolute: 0.3 10*3/uL (ref 0.1–0.9)
Monocytes: 9 %
Neutrophils Absolute: 2 10*3/uL (ref 1.4–7.0)
Neutrophils: 54 %
Platelets: 198 10*3/uL (ref 150–450)
RBC: 4.45 x10E6/uL (ref 4.14–5.80)
RDW: 13.5 % (ref 11.6–15.4)
WBC: 3.6 10*3/uL (ref 3.4–10.8)

## 2020-05-18 ENCOUNTER — Telehealth: Payer: Self-pay | Admitting: *Deleted

## 2020-05-18 ENCOUNTER — Other Ambulatory Visit: Payer: Self-pay

## 2020-05-18 ENCOUNTER — Ambulatory Visit (HOSPITAL_COMMUNITY)
Admission: RE | Admit: 2020-05-18 | Discharge: 2020-05-18 | Disposition: A | Discharge status: Home / Self Care | Payer: PPO | Source: Ambulatory Visit | Attending: Hematology & Oncology | Admitting: Hematology & Oncology

## 2020-05-18 DIAGNOSIS — C4371 Malignant melanoma of right lower limb, including hip: Secondary | ICD-10-CM

## 2020-05-18 LAB — GLUCOSE, CAPILLARY: Glucose-Capillary: 84 mg/dL (ref 70–99)

## 2020-05-18 MED ORDER — FLUDEOXYGLUCOSE F - 18 (FDG) INJECTION
12.1500 | Freq: Once | INTRAVENOUS | Status: AC | PRN
  Administered 2020-05-18: 12.15 via INTRAVENOUS

## 2020-05-18 NOTE — Telephone Encounter (Signed)
As noted below by Dr. Marin Olp, I informed the wife that there is NO melanoma. She verbalized understanding.

## 2020-05-18 NOTE — Telephone Encounter (Signed)
-----   Message from [name], MD sent at 05/18/2020  3:14 PM EST ----- Call - NO melanoma!!  Merry Christmas!!  Laurey Arrow

## 2020-05-19 DIAGNOSIS — I9589 Other hypotension: Secondary | ICD-10-CM | POA: Diagnosis not present

## 2020-05-19 DIAGNOSIS — E271 Primary adrenocortical insufficiency: Secondary | ICD-10-CM | POA: Diagnosis not present

## 2020-05-21 ENCOUNTER — Other Ambulatory Visit: Payer: Self-pay

## 2020-05-21 ENCOUNTER — Telehealth: Payer: Self-pay | Admitting: Hematology & Oncology

## 2020-05-21 ENCOUNTER — Inpatient Hospital Stay (HOSPITAL_BASED_OUTPATIENT_CLINIC_OR_DEPARTMENT_OTHER): Payer: PPO | Admitting: Hematology & Oncology

## 2020-05-21 ENCOUNTER — Encounter: Payer: Self-pay | Admitting: Hematology & Oncology

## 2020-05-21 ENCOUNTER — Inpatient Hospital Stay: Payer: PPO | Attending: Hematology & Oncology

## 2020-05-21 VITALS — BP 107/79 | HR 62 | Temp 97.8°F | Resp 19 | Wt 243.0 lb

## 2020-05-21 DIAGNOSIS — D509 Iron deficiency anemia, unspecified: Secondary | ICD-10-CM | POA: Diagnosis not present

## 2020-05-21 DIAGNOSIS — Z79899 Other long term (current) drug therapy: Secondary | ICD-10-CM | POA: Insufficient documentation

## 2020-05-21 DIAGNOSIS — C4371 Malignant melanoma of right lower limb, including hip: Secondary | ICD-10-CM

## 2020-05-21 DIAGNOSIS — D696 Thrombocytopenia, unspecified: Secondary | ICD-10-CM

## 2020-05-21 DIAGNOSIS — M1990 Unspecified osteoarthritis, unspecified site: Secondary | ICD-10-CM | POA: Diagnosis not present

## 2020-05-21 DIAGNOSIS — C7A8 Other malignant neuroendocrine tumors: Secondary | ICD-10-CM

## 2020-05-21 LAB — CMP (CANCER CENTER ONLY)
ALT: 17 U/L (ref 0–44)
AST: 17 U/L (ref 15–41)
Albumin: 4.5 g/dL (ref 3.5–5.0)
Alkaline Phosphatase: 76 U/L (ref 38–126)
Anion gap: 7 (ref 5–15)
BUN: 18 mg/dL (ref 6–20)
CO2: 27 mmol/L (ref 22–32)
Calcium: 9.6 mg/dL (ref 8.9–10.3)
Chloride: 106 mmol/L (ref 98–111)
Creatinine: 0.98 mg/dL (ref 0.61–1.24)
GFR, Estimated: >60 mL/min (ref >60)
Glucose, Bld: 89 mg/dL (ref 70–99)
Potassium: 3.7 mmol/L (ref 3.5–5.1)
Sodium: 140 mmol/L (ref 135–145)
Total Bilirubin: 0.5 mg/dL (ref 0.3–1.2)
Total Protein: 7.2 g/dL (ref 6.5–8.1)

## 2020-05-21 LAB — CBC WITH DIFFERENTIAL (CANCER CENTER ONLY)
Abs Immature Granulocytes: 0 K/uL (ref 0.00–0.07)
Basophils Absolute: 0 K/uL (ref 0.0–0.1)
Basophils Relative: 1 %
Eosinophils Absolute: 0.3 K/uL (ref 0.0–0.5)
Eosinophils Relative: 9 %
HCT: 40.1 % (ref 39.0–52.0)
Hemoglobin: 13.9 g/dL (ref 13.0–17.0)
Immature Granulocytes: 0 %
Lymphocytes Relative: 34 %
Lymphs Abs: 1.2 K/uL (ref 0.7–4.0)
MCH: 31.7 pg (ref 26.0–34.0)
MCHC: 34.7 g/dL (ref 30.0–36.0)
MCV: 91.3 fL (ref 80.0–100.0)
Monocytes Absolute: 0.3 K/uL (ref 0.1–1.0)
Monocytes Relative: 10 %
Neutro Abs: 1.6 K/uL — ABNORMAL LOW (ref 1.7–7.7)
Neutrophils Relative %: 46 %
Platelet Count: 208 K/uL (ref 150–400)
RBC: 4.39 MIL/uL (ref 4.22–5.81)
RDW: 13.1 % (ref 11.5–15.5)
WBC Count: 3.4 K/uL — ABNORMAL LOW (ref 4.0–10.5)
nRBC: 0 % (ref 0.0–0.2)

## 2020-05-21 LAB — LACTATE DEHYDROGENASE: LDH: 127 U/L (ref 98–192)

## 2020-05-21 NOTE — Progress Notes (Signed)
Hematology and Oncology Follow Up Visit  James Byrd [id number] 10/10/1965 54 y.o. 05/21/2020   Principle Diagnosis:  Stage IIIB ([id number]) subungual melanoma of the right hallux -- nodal recurrence in the RIGHT inguinal nodes -- BRAF wt Neuroendocrine carcinoma of the pancreas Iron def anemia  Current Therapy:   Yervoy-status post 3 cycles - discontinued in September 2016 secondary to toxicity  XRT to the RIGHT inguinal basin Nivolumab 400 mg q month -- s/p cycle #8 -start in 12/2018 - d/c on 08/2019 due to toxicity Somatuline 120 mg IM q month -- started on 05/22/2019 - d/c on 08/2019 IV Iron -- Iron Dextran given on 09/18/2019   Interim History:  James Byrd is here today for follow-up.  He looks quite good.  His main problem has been the arthritis.  He is on methotrexate weekly for this.  This is injectable methotrexate.  He seems to be doing pretty well with this.  He does have flareups of the arthritis.  We did do a PET scan on him earlier in the week.  Thankfully, the PET scan does not show any evidence of recurrent melanoma.  Everything really looks good on the PET scan.  He has had no problems with bleeding.  Is no problems with bowels or bladder.  He has had no cough or shortness of breath.  He has had no rashes.  He has had some neuropathy.  He wants to get off the Neurontin.  I think this would be okay for him.  He will taper off.  He has had no problems with bleeding.  He has had no headache.  Overall, his performance status is ECOG 1.  Allergies: No Known Allergies  Past Medical History, Surgical history, Social history, and Family History were reviewed and updated.  Review of Systems: Review of Systems  Constitutional: Positive for malaise/fatigue.  HENT: Negative.   Eyes: Negative.   Respiratory: Positive for shortness of breath.   Cardiovascular: Negative.   Gastrointestinal: Positive for constipation and nausea.  Genitourinary: Negative.   Musculoskeletal:  Positive for joint pain and myalgias.  Skin: Negative.   Neurological: Positive for tingling and focal weakness.  Endo/Heme/Allergies: Negative.   Psychiatric/Behavioral: Positive for memory loss.     Physical Exam:  weight is 243 lb (110.2 kg). His oral temperature is 97.8 F (36.6 C). His blood pressure is 107/79 and his pulse is 62. His respiration is 19 and oxygen saturation is 99%.   Wt Readings from Last 3 Encounters:  05/21/20 243 lb (110.2 kg)  04/08/20 243 lb 9.6 oz (110.5 kg)  03/25/20 245 lb 12.8 oz (111.5 kg)    Physical Exam Vitals reviewed.  HENT:     Head: Normocephalic and atraumatic.  Eyes:     Pupils: Pupils are equal, round, and reactive to light.  Cardiovascular:     Rate and Rhythm: Normal rate and regular rhythm.     Heart sounds: Normal heart sounds.  Pulmonary:     Effort: Pulmonary effort is normal.     Breath sounds: Normal breath sounds.  Abdominal:     General: Bowel sounds are normal.     Palpations: Abdomen is soft.     Comments: Abdominal exam shows a soft abdomen.  Bowel sounds are present.  There is no palpable abdominal mass.  There is no palpable liver or spleen tip.  He has a healing right inguinal lymphadenectomy scar.  I cannot palpate any obvious adenopathy.  The left inguinal area is unremarkable.  Musculoskeletal:        General: No tenderness or deformity. Normal range of motion.     Cervical back: Normal range of motion.     Comments: The big toe on the right foot is amputated.  There is no lymphedema in the right leg.  He has good pulses in his distal extremities.  Left leg is unremarkable.  Lymphadenopathy:     Cervical: No cervical adenopathy.  Skin:    General: Skin is warm and dry.     Findings: No erythema or rash.  Neurological:     Mental Status: He is alert and oriented to person, place, and time.  Psychiatric:        Behavior: Behavior normal.        Thought Content: Thought content normal.        Judgment:  Judgment normal.      Lab Results  Component Value Date   WBC 3.4 (L) 05/21/2020   HGB 13.9 05/21/2020   HCT 40.1 05/21/2020   MCV 91.3 05/21/2020   PLT 208 05/21/2020   Lab Results  Component Value Date   FERRITIN 1,228 (H) 02/20/2020   IRON 87 02/20/2020   TIBC 225 02/20/2020   UIBC 138 02/20/2020   IRONPCTSAT 39 02/20/2020   Lab Results  Component Value Date   RETICCTPCT 1.6 11/27/2019   RBC 4.39 05/21/2020   No results found for: Nils Pyle [facility] Lab Results  Component Value Date   IGGSERUM 1,046 03/25/2020   IGMSERUM 64 03/25/2020   Lab Results  Component Value Date   ALBUMINELP 4.3 03/25/2020   A1GS 0.3 03/25/2020   A2GS 0.7 03/25/2020   BETS 0.4 03/25/2020   BETA2SER 0.3 03/25/2020   GAMS 0.9 03/25/2020   SPEI  03/25/2020     Comment:     Normal Serum Protein Electrophoresis Pattern. No abnormal protein bands (M-protein) detected.      Chemistry      Component Value Date/Time   NA 140 05/21/2020 0832   NA 144 05/11/2020 1400   NA 145 06/06/2017 1316   NA 141 10/16/2015 1334   K 3.7 05/21/2020 0832   K 4.1 06/06/2017 1316   K 3.9 10/16/2015 1334   CL 106 05/21/2020 0832   CL 108 06/06/2017 1316   CO2 27 05/21/2020 0832   CO2 26 06/06/2017 1316   CO2 21 (L) 10/16/2015 1334   BUN 18 05/21/2020 0832   BUN 15 05/11/2020 1400   BUN 19 06/06/2017 1316   BUN 20.0 10/16/2015 1334   CREATININE 0.98 05/21/2020 0832   CREATININE 1.0 06/06/2017 1316   CREATININE 1.0 10/16/2015 1334      Component Value Date/Time   CALCIUM 9.6 05/21/2020 0832   CALCIUM 9.4 06/06/2017 1316   CALCIUM 9.7 10/16/2015 1334   ALKPHOS 76 05/21/2020 0832   ALKPHOS 78 06/06/2017 1316   ALKPHOS 62 10/16/2015 1334   AST 17 05/21/2020 0832   AST 16 10/16/2015 1334   ALT 17 05/21/2020 0832   ALT 27 06/06/2017 1316   ALT 11 10/16/2015 1334   BILITOT 0.5 05/21/2020 0832   BILITOT 0.49 10/16/2015 1334      Impression and Plan: James Byrd is a very  pleasant 53 yo caucasian gentleman with history of stage IIIB melanoma of the right hallux that was subungual with one microscopic positive inguinal lymph node. He completed 3 cycles of Yervoy but stopped due to side effects.   It is so nice to see that he  is doing well.  I just wish that the arthritis would be a little bit better.  For right now, we will move his appointments out to every 6 months.  I think this would be reasonable.  We still have to get our PET scan before we see him back as he is at risk for recurrence.  I told him that I had no problems with him on the methotrexate.  His white cell count being a little bit low is a side effect of the methotrexate.  The white cell count is not low enough that would cause him any problems.      [name], MD 12/2/20219:34 AM

## 2020-05-21 NOTE — Telephone Encounter (Signed)
Appointments scheduled patient has my chart access for appt info per 12/2 los

## 2020-05-22 ENCOUNTER — Encounter: Payer: Self-pay | Admitting: Rheumatology

## 2020-05-22 ENCOUNTER — Ambulatory Visit (INDEPENDENT_AMBULATORY_CARE_PROVIDER_SITE_OTHER): Payer: PPO | Admitting: Rheumatology

## 2020-05-22 VITALS — BP 115/81 | HR 79 | Resp 16 | Ht 72.0 in | Wt 246.0 lb

## 2020-05-22 DIAGNOSIS — I2699 Other pulmonary embolism without acute cor pulmonale: Secondary | ICD-10-CM

## 2020-05-22 DIAGNOSIS — Z79899 Other long term (current) drug therapy: Secondary | ICD-10-CM

## 2020-05-22 DIAGNOSIS — M19042 Primary osteoarthritis, left hand: Secondary | ICD-10-CM

## 2020-05-22 DIAGNOSIS — M170 Bilateral primary osteoarthritis of knee: Secondary | ICD-10-CM | POA: Diagnosis not present

## 2020-05-22 DIAGNOSIS — M19072 Primary osteoarthritis, left ankle and foot: Secondary | ICD-10-CM

## 2020-05-22 DIAGNOSIS — M1990 Unspecified osteoarthritis, unspecified site: Secondary | ICD-10-CM

## 2020-05-22 DIAGNOSIS — J189 Pneumonia, unspecified organism: Secondary | ICD-10-CM

## 2020-05-22 DIAGNOSIS — G4733 Obstructive sleep apnea (adult) (pediatric): Secondary | ICD-10-CM | POA: Diagnosis not present

## 2020-05-22 DIAGNOSIS — T50905A Adverse effect of unspecified drugs, medicaments and biological substances, initial encounter: Secondary | ICD-10-CM

## 2020-05-22 DIAGNOSIS — M19041 Primary osteoarthritis, right hand: Secondary | ICD-10-CM | POA: Diagnosis not present

## 2020-05-22 DIAGNOSIS — I471 Supraventricular tachycardia: Secondary | ICD-10-CM

## 2020-05-22 DIAGNOSIS — R5383 Other fatigue: Secondary | ICD-10-CM

## 2020-05-22 DIAGNOSIS — E2740 Unspecified adrenocortical insufficiency: Secondary | ICD-10-CM | POA: Diagnosis not present

## 2020-05-22 DIAGNOSIS — M19071 Primary osteoarthritis, right ankle and foot: Secondary | ICD-10-CM | POA: Diagnosis not present

## 2020-05-22 DIAGNOSIS — C7A8 Other malignant neuroendocrine tumors: Secondary | ICD-10-CM | POA: Diagnosis not present

## 2020-05-22 DIAGNOSIS — Z8639 Personal history of other endocrine, nutritional and metabolic disease: Secondary | ICD-10-CM | POA: Diagnosis not present

## 2020-05-22 DIAGNOSIS — C4371 Malignant melanoma of right lower limb, including hip: Secondary | ICD-10-CM

## 2020-05-22 MED ORDER — TRIAMCINOLONE ACETONIDE 40 MG/ML IJ SUSP
40.0000 mg | INTRAMUSCULAR | Status: AC | PRN
  Administered 2020-05-22: 40 mg via INTRA_ARTICULAR

## 2020-05-22 MED ORDER — LIDOCAINE HCL 1 % IJ SOLN
1.5000 mL | INTRAMUSCULAR | Status: AC | PRN
  Administered 2020-05-22: 1.5 mL

## 2020-05-22 NOTE — Patient Instructions (Signed)
Increase methotrexate to 1.0 mL subcu weekly Take folic acid 2 mg daily.  Standing Labs We placed an order today for your standing lab work.   Please have your standing labs drawn in 2 weeks after increasing the dose of methotrexate than 2 months if labs are stable then we will check labs every 3 months.  If possible, please have your labs drawn 2 weeks prior to your appointment so that the provider can discuss your results at your appointment.  We have open lab daily Monday through Thursday from 8:30-12:30 PM and 1:30-4:30 PM and Friday from 8:30-12:30 PM and 1:30-4:00 PM at the office of Dr. Bo Merino, [facility].   Please be advised, patients with office appointments requiring lab work will take precedents over walk-in lab work.  If possible, please come for your lab work on Monday and Friday afternoons, as you may experience shorter wait times. The office is located at [address], [place], [place], [place] [address] No appointment is necessary.   Labs are drawn by [facility]. Please bring your co-pay at the time of your lab draw.  You may receive a bill from [facility] for your lab work.  If you wish to have your labs drawn at another location, please call the office 24 hours in advance to send orders.  If you have any questions regarding directions or hours of operation,  please call [phone].   As a reminder, please drink plenty of water prior to coming for your lab work. Thanks!

## 2020-05-26 ENCOUNTER — Telehealth: Payer: Self-pay

## 2020-05-26 NOTE — Telephone Encounter (Signed)
Pharmacist at [facility] called requesting new prescription for Methotrexate with increased dosage (1.0 ml weekly).

## 2020-05-26 NOTE — Telephone Encounter (Signed)
Last Visit: 05/22/2020 Next Visit: 07/24/2020  Labs: 05/21/2020 WBC 3.4, Neutro Abs. 1.6  Current Dose per office note 05/22/2020: advised him to increase methotrexate to 1 mL subcu weekly.   DX: Chronic inflammatory arthritis   Okay to refill MTX?

## 2020-05-26 NOTE — Telephone Encounter (Signed)
I called patient and spoke with the patient and his wife.  His white cell count is low at 3.4.  I advised him to decrease methotrexate to 0.8 mL subcu weekly.  He took 1 dose of methotrexate 1 mL subcu on last Monday.  He will come in 2 weeks for repeat labs.  We will discuss further dosing based on the lab work.  At this point he will continue methotrexate at 0.8 mL subcu weekly.  I also discussed with his wife that if his symptoms are not better over the next 2 to 3 weeks she should contact us for an earlier appointment.  We may have to add anti-TNF's.  We will keep the dose of methotrexate at 0.8 mL subcu weekly.

## 2020-06-04 ENCOUNTER — Other Ambulatory Visit: Payer: Self-pay | Admitting: *Deleted

## 2020-06-04 ENCOUNTER — Telehealth: Payer: Self-pay

## 2020-06-04 DIAGNOSIS — Z79899 Other long term (current) drug therapy: Secondary | ICD-10-CM

## 2020-06-04 NOTE — Telephone Encounter (Signed)
[facility] called stating patient is at their office and requested labwork orders be released.

## 2020-06-04 NOTE — Telephone Encounter (Signed)
Lab Orders released.  

## 2020-06-05 LAB — CBC WITH DIFFERENTIAL/PLATELET
Basophils Absolute: 0 10*3/uL (ref 0.0–0.2)
Basos: 1 %
EOS (ABSOLUTE): 0.2 10*3/uL (ref 0.0–0.4)
Eos: 5 %
Hematocrit: 42.1 % (ref 37.5–51.0)
Hemoglobin: 14.1 g/dL (ref 13.0–17.7)
Immature Grans (Abs): 0 10*3/uL (ref 0.0–0.1)
Immature Granulocytes: 0 %
Lymphocytes Absolute: 0.8 10*3/uL (ref 0.7–3.1)
Lymphs: 21 %
MCH: 31.3 pg (ref 26.6–33.0)
MCHC: 33.5 g/dL (ref 31.5–35.7)
MCV: 93 fL (ref 79–97)
Monocytes Absolute: 0.3 10*3/uL (ref 0.1–0.9)
Monocytes: 7 %
Neutrophils Absolute: 2.7 10*3/uL (ref 1.4–7.0)
Neutrophils: 66 %
Platelets: 184 10*3/uL (ref 150–450)
RBC: 4.51 x10E6/uL (ref 4.14–5.80)
RDW: 14.2 % (ref 11.6–15.4)
WBC: 4 10*3/uL (ref 3.4–10.8)

## 2020-06-05 LAB — CMP14+EGFR
ALT: 21 IU/L (ref 0–44)
AST: 21 IU/L (ref 0–40)
Albumin/Globulin Ratio: 1.9 (ref 1.2–2.2)
Albumin: 4.6 g/dL (ref 3.8–4.9)
Alkaline Phosphatase: 93 IU/L (ref 44–121)
BUN/Creatinine Ratio: 18 (ref 9–20)
BUN: 17 mg/dL (ref 6–24)
Bilirubin Total: 0.5 mg/dL (ref 0.0–1.2)
CO2: 19 mmol/L — ABNORMAL LOW (ref 20–29)
Calcium: 9.5 mg/dL (ref 8.7–10.2)
Chloride: 103 mmol/L (ref 96–106)
Creatinine, Ser: 0.95 mg/dL (ref 0.76–1.27)
GFR calc Af Amer: 105 mL/min/{1.73_m2} (ref >59)
GFR calc non Af Amer: 91 mL/min/{1.73_m2} (ref >59)
Globulin, Total: 2.4 g/dL (ref 1.5–4.5)
Glucose: 88 mg/dL (ref 65–99)
Potassium: 4.3 mmol/L (ref 3.5–5.2)
Sodium: 138 mmol/L (ref 134–144)
Total Protein: 7 g/dL (ref 6.0–8.5)

## 2020-06-05 NOTE — Progress Notes (Signed)
CBC and CMP are normal.

## 2020-06-16 DIAGNOSIS — J329 Chronic sinusitis, unspecified: Secondary | ICD-10-CM | POA: Diagnosis not present

## 2020-06-16 DIAGNOSIS — J40 Bronchitis, not specified as acute or chronic: Secondary | ICD-10-CM | POA: Diagnosis not present

## 2020-06-16 DIAGNOSIS — Z20828 Contact with and (suspected) exposure to other viral communicable diseases: Secondary | ICD-10-CM | POA: Diagnosis not present

## 2020-06-17 DIAGNOSIS — U071 COVID-19: Secondary | ICD-10-CM | POA: Diagnosis not present

## 2020-06-18 DIAGNOSIS — M1990 Unspecified osteoarthritis, unspecified site: Secondary | ICD-10-CM | POA: Diagnosis not present

## 2020-06-18 DIAGNOSIS — E7849 Other hyperlipidemia: Secondary | ICD-10-CM | POA: Diagnosis not present

## 2020-07-13 NOTE — Progress Notes (Signed)
Office Visit Note  Patient: James Byrd             Date of Birth: 1966/06/10           MRN: [id number]             PCP: Emmaline Kluver, MD Referring: Emmaline Kluver, * Visit Date: 07/24/2020 Occupation: @GUAROCC @  Subjective:  Medication monitoring   History of Present Illness: James Byrd is a 54 y.o. male with history of chronic inflammatory arthritis and osteoarthritis.  Patient is currently on methotrexate 0.8 mL of his injections once weekly and folic acid 2 mg by mouth daily.  He is tolerating methotrexate without any side effects.  He was diagnosed with COVID 19 on 06/17/2020.  He received the monoclonal antibody infusion and his symptoms resolved within about 2 days.  He did not hold methotrexate during this time. He denies any recent flares.  He has not had any increased joint pain or joint swelling.  No recurrence of knee joint effusions since his last visit.  He noted significant improvement after bilateral knee joint cortisone injections at his last office visit on 05/22/20.    Activities of Daily Living:  Patient reports morning stiffness for 0  minutes.   Patient Denies nocturnal pain.  Difficulty dressing/grooming: Denies Difficulty climbing stairs: Reports Difficulty getting out of chair: Reports Difficulty using hands for taps, buttons, cutlery, and/or writing: Denies  Review of Systems  Constitutional: Negative for fatigue.  HENT: Negative for mouth sores, mouth dryness and nose dryness.   Eyes: Negative for pain, itching and dryness.  Respiratory: Negative for shortness of breath and difficulty breathing.   Cardiovascular: Negative for chest pain and palpitations.  Gastrointestinal: Negative for blood in stool, constipation and diarrhea.  Endocrine: Negative for increased urination.  Genitourinary: Negative for difficulty urinating.  Musculoskeletal: Negative for arthralgias, joint pain, joint swelling, myalgias, morning stiffness, muscle  tenderness and myalgias.  Skin: Negative for color change, rash and redness.  Allergic/Immunologic: Negative for susceptible to infections.  Neurological: Negative for dizziness, numbness, headaches, memory loss and weakness.  Hematological: Negative for bruising/bleeding tendency.  Psychiatric/Behavioral: Positive for sleep disturbance. Negative for confusion.    PMFS History:  Patient Active Problem List   Diagnosis Date Noted  . Pain in both feet 10/17/2019  . Bilateral pulmonary embolism ([place]) 08/26/2019  . Neuroendocrine carcinoma of pancreas ([place]) 05/22/2019  . Severe sepsis with septic shock ([place]) 01/15/2019  . Acute respiratory failure with hypoxia ([place]) 01/15/2019  . AKI (acute kidney injury) ([place]) 01/15/2019  . Septic shock ([place]) 01/13/2019  . Goals of care, counseling/discussion 12/17/2018  . Adrenal insufficiency ([place])   . Refractory nausea and vomiting   . Paranasal sinus disease   . Acute renal failure syndrome ([place])   . HCAP (healthcare-associated pneumonia) 03/06/2015  . Drug-induced pneumonitis - VERVOY   . Thrombocytopenia ([place]) 02/09/2015  . HLD (hyperlipidemia)   . Chronic diastolic congestive heart failure ([place])   . Malignant melanoma of right great toe Stage IIIB ([id number]) 11/14/2014  . Obstructive sleep apnea 07/19/2012  . Paroxysmal supraventricular tachycardia ([place]) 05/24/2012  . Chest pressure 05/24/2012  . Palpitations 05/24/2012  . Anxiety 05/24/2012    Past Medical History:  Diagnosis Date  . Adrenal insufficiency ([place])   . Anxiety 05/24/2012  . Complication of anesthesia    SLO TO AWAKEN MANY 8 TO 9 YRS AGO, NO ISSUES SINCE  . Goals of care, counseling/discussion 12/17/2018  . Gout   . History of  kidney stones   . HLD (hyperlipidemia)   . Malignant melanoma of right great toe ([place]) 11/14/2014  . Neuroendocrine carcinoma of pancreas ([place]) 05/22/2019  . Paroxysmal supraventricular tachycardia ([place]) 05/24/2012   Described in the treadmill report;  details are pending   . PE (pulmonary thromboembolism) ([place]) 08/2019  . Rheumatoid arthritis ([place])   . Sleep apnea    MILD NO CPAP NEEDED    Family History  Adopted: Yes  Problem Relation Age of Onset  . Stroke Mother   . COPD Father   . Diabetes Father   . Diabetes Sister   . Diabetes Brother    Past Surgical History:  Procedure Laterality Date  . CHOLECYSTECTOMY    . ESOPHAGOGASTRODUODENOSCOPY (EGD) WITH PROPOFOL N/A 05/13/2019   Procedure: ESOPHAGOGASTRODUODENOSCOPY (EGD) WITH PROPOFOL;  Surgeon: Arta Silence, MD;  Location: [facility];  Service: Endoscopy;  Laterality: N/A;  . EXTRACORPOREAL SHOCK WAVE LITHOTRIPSY  2016  . FINE NEEDLE ASPIRATION N/A 05/13/2019   Procedure: FINE NEEDLE ASPIRATION (FNA) LINEAR;  Surgeon: Arta Silence, MD;  Location: [facility];  Service: Endoscopy;  Laterality: N/A;  . LYMPH NODE BIOPSY Right 11/06/2018   Procedure: RIGHT INGUINAL LYMPH NODE BIOPSY AND POSSIBLE RIGHT GROIN NODE DISECTION;  Surgeon: Alphonsa Overall, MD;  Location: [facility];  Service: General;  Laterality: Right;  . r toe partial ambutation    . ROTATOR CUFF REPAIR Right 2010   3 yrs ago  . UPPER ESOPHAGEAL ENDOSCOPIC ULTRASOUND (EUS) N/A 05/13/2019   Procedure: UPPER ESOPHAGEAL ENDOSCOPIC ULTRASOUND (EUS);  Surgeon: Arta Silence, MD;  Location: [facility];  Service: Endoscopy;  Laterality: N/A;   Social History   Social History Narrative   Lives with wife in a one story home.  Has 2 children.  On disability.  Education: high school.+   Immunization History  Administered Date(s) Administered  . Influenza-Unspecified 04/03/2014     Objective: Vital Signs: BP (!) 146/78 (BP Location: Left Arm, Patient Position: Sitting, Cuff Size: Normal)   Pulse 66   Resp 16   Ht 6' (1.829 m)   Wt 240 lb 6.4 oz (109 kg)   BMI 32.60 kg/m    Physical Exam Vitals and nursing note reviewed.  Constitutional:      Appearance: He is well-developed and well-nourished.  HENT:      Head: Normocephalic and atraumatic.  Eyes:     Extraocular Movements: EOM normal.     Conjunctiva/sclera: Conjunctivae normal.     Pupils: Pupils are equal, round, and reactive to light.  Pulmonary:     Effort: Pulmonary effort is normal.  Abdominal:     Palpations: Abdomen is soft.  Musculoskeletal:     Cervical back: Normal range of motion and neck supple.  Skin:    General: Skin is warm and dry.     Capillary Refill: Capillary refill takes less than 2 seconds.  Neurological:     Mental Status: He is alert and oriented to person, place, and time.  Psychiatric:        Mood and Affect: Mood and affect normal.        Behavior: Behavior normal.      Musculoskeletal Exam: C-spine has slightly limited range of motion with lateral rotation.  Thoracic and lumbar spine have good range of motion with no discomfort.  Shoulder joints, elbow joints, wrist joints, MCPs, PIPs, DIPs have good range of motion with no synovitis.  He is able to make a complete fist bilaterally.  Synovial thickening over the right  wrist joint was noted.  Hip joints have good range of motion with no discomfort.  Knee joints have good range of motion with no warmth or effusion.  Ankle joints have good range of motion with no tenderness or inflammation.    CDAI Exam: CDAI Score: 0.4  Patient Global: 2 mm; Provider Global: 2 mm Swollen: 0 ; Tender: 0  Joint Exam 07/24/2020   No joint exam has been documented for this visit   There is currently no information documented on the homunculus. Go to the Rheumatology activity and complete the homunculus joint exam.  Investigation: No additional findings.  Imaging: No results found.  Recent Labs: Lab Results  Component Value Date   WBC 4.0 06/04/2020   HGB 14.1 06/04/2020   PLT 184 06/04/2020   NA 138 06/04/2020   K 4.3 06/04/2020   CL 103 06/04/2020   CO2 19 (L) 06/04/2020   GLUCOSE 88 06/04/2020   BUN 17 06/04/2020   CREATININE 0.95 06/04/2020   BILITOT 0.5  06/04/2020   ALKPHOS 93 06/04/2020   AST 21 06/04/2020   ALT 21 06/04/2020   PROT 7.0 06/04/2020   ALBUMIN 4.6 06/04/2020   CALCIUM 9.5 06/04/2020   GFRAA 105 06/04/2020   QFTBGOLDPLUS NEGATIVE 03/25/2020    Speciality Comments: No specialty comments available.  Procedures:  No procedures performed Allergies: Patient has no known allergies.   Assessment / Plan:     Visit Diagnoses: Chronic inflammatory arthritis - History of a stage IIIb malignant melanoma with nodal mets and neuroendocrine carcinoma of the pancreas. Status post immunotherapy: He has no joint tenderness or synovitis on exam.  He has bilateral knee joint cortisone injections performed on 05/22/2020 which resolved his joint pain and effusions.  He has not had any recurrence of knee joint pain.  He is clinically been doing well on methotrexate 0.8 mL sq injections once weekly and folic acid 2 mg by mouth daily.  He has been tolerating methotrexate without any side effects.  He has not noticed any increased joint pain or inflammation since reducing the dose of methotrexate from 1.0 mL to 0.8 mL weekly after a drop in his white blood cell count on 05/21/20.  His white blood cell count has returned to within normal limits.  He will continue on methotrexate 0.8 mL sq injections once weekly and folic acid 2 mg by mouth daily. A refill of MTX was sent to the pharmacy. He was advised to notify us if he develops increased joint pain or joint swelling.  He will follow-up in the office in 3-4 months.   High risk medication use -Methotrexate 0.8 ml sq injections once weekly and folic acid 2 mg po daily.  CBC and CMP were updated on 06/04/2020.  Lab results were reviewed with the patient today in the office.  He will be due to update lab work in March and every 3 months to monitor for drug toxicity.  Standing orders for CBC and CMP remain in place.  He plans on having his next lab work drawn at his PCPs office. We discussed the importance of  holding methotrexate if he develops signs or symptoms of an infection and to resume once the infection has completely cleared. He has not received any of the COVID-19 vaccinations.  He was diagnosed with Covid on 06/17/2020 and received the monoclonal antibody infusion.  Primary osteoarthritis of both hands: Mild PIP and DIP thickening consistent with osteoarthritis of both hands.  Synovial thickening of the right wrist  joint noted.  No synovitis or joint tenderness was noted.  Primary osteoarthritis of both knees: He had bilateral knee joint cortisone injections performed on 05/22/2020.  His knee joint pain and inflammation resolved after and has not recurred.  He had good range of motion of both knee joints on examination today with no warmth or effusion.  Primary osteoarthritis of both feet: He is not experiencing any discomfort in his feet at this time.  He has good range of motion of both ankle joints with no tenderness or inflammation.  He is wearing proper fitting shoes.  Other fatigue: Stable   Other medical conditions are listed as follows:   Adrenal insufficiency (HCC)  Malignant melanoma of right great toe Stage IIIB ([id number])  Paroxysmal supraventricular tachycardia (H[facility])  Neuroendocrine carcinoma of pancreas ([place])  History of hyperlipidemia  Obstructive sleep apnea  Bilateral pulmonary embolism (H[facility])  Drug-induced pneumonitis - VERVOY  Severe sepsis with septic shock ([place])  Orders: No orders of the defined types were placed in this encounter.  Meds ordered this encounter  Medications  . Methotrexate Sodium (METHOTREXATE, PF,) 50 MG/2ML injection    Sig: Inject 0.8 mL SQ once weekly.    Dispense:  4 mL    Refill:  2     Follow-Up Instructions: Return in about 4 months (around 11/21/2020) for Chronic inflammatory arthritis .   Ofilia Neas, PA-C  Note - This record has been created using Dragon software.  Chart creation errors have been sought, but may not  always  have been located. Such creation errors do not reflect on  the standard of medical care.

## 2020-07-24 ENCOUNTER — Encounter: Payer: Self-pay | Admitting: Physician Assistant

## 2020-07-24 ENCOUNTER — Other Ambulatory Visit: Payer: Self-pay

## 2020-07-24 ENCOUNTER — Ambulatory Visit: Payer: PPO | Admitting: Physician Assistant

## 2020-07-24 VITALS — BP 146/78 | HR 66 | Resp 16 | Ht 72.0 in | Wt 240.4 lb

## 2020-07-24 DIAGNOSIS — Z79899 Other long term (current) drug therapy: Secondary | ICD-10-CM

## 2020-07-24 DIAGNOSIS — R6521 Severe sepsis with septic shock: Secondary | ICD-10-CM

## 2020-07-24 DIAGNOSIS — C4371 Malignant melanoma of right lower limb, including hip: Secondary | ICD-10-CM | POA: Diagnosis not present

## 2020-07-24 DIAGNOSIS — A419 Sepsis, unspecified organism: Secondary | ICD-10-CM

## 2020-07-24 DIAGNOSIS — J189 Pneumonia, unspecified organism: Secondary | ICD-10-CM

## 2020-07-24 DIAGNOSIS — G4733 Obstructive sleep apnea (adult) (pediatric): Secondary | ICD-10-CM | POA: Diagnosis not present

## 2020-07-24 DIAGNOSIS — R5383 Other fatigue: Secondary | ICD-10-CM | POA: Diagnosis not present

## 2020-07-24 DIAGNOSIS — M170 Bilateral primary osteoarthritis of knee: Secondary | ICD-10-CM

## 2020-07-24 DIAGNOSIS — E2740 Unspecified adrenocortical insufficiency: Secondary | ICD-10-CM | POA: Diagnosis not present

## 2020-07-24 DIAGNOSIS — M19041 Primary osteoarthritis, right hand: Secondary | ICD-10-CM | POA: Diagnosis not present

## 2020-07-24 DIAGNOSIS — C7A8 Other malignant neuroendocrine tumors: Secondary | ICD-10-CM

## 2020-07-24 DIAGNOSIS — M19072 Primary osteoarthritis, left ankle and foot: Secondary | ICD-10-CM

## 2020-07-24 DIAGNOSIS — Z8639 Personal history of other endocrine, nutritional and metabolic disease: Secondary | ICD-10-CM

## 2020-07-24 DIAGNOSIS — T50905A Adverse effect of unspecified drugs, medicaments and biological substances, initial encounter: Secondary | ICD-10-CM

## 2020-07-24 DIAGNOSIS — M19071 Primary osteoarthritis, right ankle and foot: Secondary | ICD-10-CM

## 2020-07-24 DIAGNOSIS — I2699 Other pulmonary embolism without acute cor pulmonale: Secondary | ICD-10-CM

## 2020-07-24 DIAGNOSIS — M19042 Primary osteoarthritis, left hand: Secondary | ICD-10-CM

## 2020-07-24 DIAGNOSIS — I471 Supraventricular tachycardia, unspecified: Secondary | ICD-10-CM

## 2020-07-24 DIAGNOSIS — M1990 Unspecified osteoarthritis, unspecified site: Secondary | ICD-10-CM | POA: Diagnosis not present

## 2020-07-24 MED ORDER — METHOTREXATE SODIUM CHEMO INJECTION (PF) 50 MG/2ML
INTRAMUSCULAR | 2 refills | Status: DC
Start: 2020-07-24 — End: 2020-12-14

## 2020-07-24 NOTE — Patient Instructions (Addendum)
Standing Labs We placed an order today for your standing lab work.   Please have your standing labs drawn in March and every 3 months   CBC and CMP   If possible, please have your labs drawn 2 weeks prior to your appointment so that the provider can discuss your results at your appointment.  We have open lab daily Monday through Thursday from 8:30-12:30 PM and 1:30-4:30 PM and Friday from 8:30-12:30 PM and 1:30-4:00 PM at the office of Dr. Bo Merino, [facility].   Please be advised, all patients with office appointments requiring lab work will take precedents over walk-in lab work.  If possible, please come for your lab work on Monday and Friday afternoons, as you may experience shorter wait times. The office is located at [address], [place], [place], [place] [address] No appointment is necessary.   Labs are drawn by [facility]. Please bring your co-pay at the time of your lab draw.  You may receive a bill from [facility] for your lab work.  If you wish to have your labs drawn at another location, please call the office 24 hours in advance to send orders.  If you have any questions regarding directions or hours of operation,  please call [phone].   As a reminder, please drink plenty of water prior to coming for your lab work. Thanks!

## 2020-08-17 DIAGNOSIS — E271 Primary adrenocortical insufficiency: Secondary | ICD-10-CM | POA: Diagnosis not present

## 2020-08-17 DIAGNOSIS — I9589 Other hypotension: Secondary | ICD-10-CM | POA: Diagnosis not present

## 2020-08-25 ENCOUNTER — Other Ambulatory Visit: Payer: Self-pay | Admitting: Hematology & Oncology

## 2020-08-31 ENCOUNTER — Encounter: Payer: Self-pay | Admitting: *Deleted

## 2020-09-16 DIAGNOSIS — E271 Primary adrenocortical insufficiency: Secondary | ICD-10-CM | POA: Diagnosis not present

## 2020-09-16 DIAGNOSIS — I9589 Other hypotension: Secondary | ICD-10-CM | POA: Diagnosis not present

## 2020-10-06 ENCOUNTER — Other Ambulatory Visit: Payer: Self-pay | Admitting: Rheumatology

## 2020-10-06 ENCOUNTER — Other Ambulatory Visit: Payer: Self-pay | Admitting: *Deleted

## 2020-10-06 ENCOUNTER — Other Ambulatory Visit: Payer: Self-pay | Admitting: Hematology & Oncology

## 2020-10-07 ENCOUNTER — Encounter: Payer: Self-pay | Admitting: Hematology & Oncology

## 2020-11-11 DIAGNOSIS — J329 Chronic sinusitis, unspecified: Secondary | ICD-10-CM | POA: Diagnosis not present

## 2020-11-11 DIAGNOSIS — J40 Bronchitis, not specified as acute or chronic: Secondary | ICD-10-CM | POA: Diagnosis not present

## 2020-11-12 ENCOUNTER — Ambulatory Visit (HOSPITAL_COMMUNITY): Admission: RE | Admit: 2020-11-12 | Payer: PPO | Source: Ambulatory Visit

## 2020-11-19 ENCOUNTER — Inpatient Hospital Stay: Payer: PPO | Admitting: Hematology & Oncology

## 2020-11-19 ENCOUNTER — Inpatient Hospital Stay: Payer: PPO | Attending: Hematology & Oncology

## 2020-11-19 DIAGNOSIS — D509 Iron deficiency anemia, unspecified: Secondary | ICD-10-CM | POA: Insufficient documentation

## 2020-11-19 DIAGNOSIS — Z79899 Other long term (current) drug therapy: Secondary | ICD-10-CM | POA: Insufficient documentation

## 2020-11-19 DIAGNOSIS — C779 Secondary and unspecified malignant neoplasm of lymph node, unspecified: Secondary | ICD-10-CM | POA: Insufficient documentation

## 2020-11-19 DIAGNOSIS — Z923 Personal history of irradiation: Secondary | ICD-10-CM | POA: Insufficient documentation

## 2020-11-19 DIAGNOSIS — C4371 Malignant melanoma of right lower limb, including hip: Secondary | ICD-10-CM | POA: Insufficient documentation

## 2020-11-20 ENCOUNTER — Ambulatory Visit: Payer: PPO | Admitting: Rheumatology

## 2020-11-24 ENCOUNTER — Telehealth: Payer: Self-pay

## 2020-11-24 NOTE — Telephone Encounter (Signed)
Returned call to pts wife to r/s appts to follow pet scan on 12/09/20   James Byrd

## 2020-12-01 NOTE — Progress Notes (Signed)
Office Visit Note  Patient: James Byrd             Date of Birth: 06/23/1965           MRN: [id number]             PCP: Emmaline Kluver, MD Referring: Street, [place], * Visit Date: 12/15/2020 Occupation: @GUAROCC @  Subjective:  No chief complaint on file.   History of Present Illness: James Byrd is a 55 y.o. male with a history of melanoma and chronic inflammatory arthritis.  He developed nausea and vomiting on December 01, 2020.  He went to the emergency room in [place].  Where he had a chest x-ray which was suspicious of pneumonia.  He also had a CT scan of his abdomen which showed nodule.  He had a PET scan on December 10, 2020 which was reviewed by Dr. Marin Olp.  He had right iliac nodule which will require radiation therapy.  He had been doing well on methotrexate he took his last dose of methotrexate on December 07, 2020.  His joint symptoms are well controlled without any joint swelling.  He has been staying active and playing golf.  Activities of Daily Living:  Patient reports morning stiffness for 0 minutes.   Patient Denies nocturnal pain.  Difficulty dressing/grooming: Denies Difficulty climbing stairs: Denies Difficulty getting out of chair: Denies Difficulty using hands for taps, buttons, cutlery, and/or writing: Denies  Review of Systems  Constitutional:  Negative for fatigue.  HENT:  Negative for mouth sores, mouth dryness and nose dryness.   Eyes:  Negative for pain, itching and dryness.  Respiratory:  Negative for shortness of breath and difficulty breathing.   Cardiovascular:  Negative for chest pain and palpitations.  Gastrointestinal:  Negative for blood in stool, constipation and diarrhea.  Endocrine: Negative for increased urination.  Genitourinary:  Negative for difficulty urinating.  Musculoskeletal:  Negative for joint pain, joint pain, joint swelling, myalgias, morning stiffness, muscle tenderness and myalgias.  Skin:  Negative for color change,  rash and redness.  Allergic/Immunologic: Positive for susceptible to infections.  Neurological:  Negative for dizziness, numbness, headaches, memory loss and weakness.  Hematological:  Positive for bruising/bleeding tendency.  Psychiatric/Behavioral:  Negative for confusion.    PMFS History:  Patient Active Problem List   Diagnosis Date Noted   Pain in both feet 10/17/2019   Bilateral pulmonary embolism ([place]) 08/26/2019   Neuroendocrine carcinoma of pancreas ([place]) 05/22/2019   Severe sepsis with septic shock ([place]) 01/15/2019   Acute respiratory failure with hypoxia ([place]) 01/15/2019   AKI (acute kidney injury) ([place]) 01/15/2019   Septic shock ([place]) 01/13/2019   Goals of care, counseling/discussion 12/17/2018   Adrenal insufficiency (HCC)    Refractory nausea and vomiting    Paranasal sinus disease    Acute renal failure syndrome ([place])    HCAP (healthcare-associated pneumonia) 03/06/2015   Drug-induced pneumonitis - VERVOY    Thrombocytopenia ([place]) 02/09/2015   HLD (hyperlipidemia)    Chronic diastolic congestive heart failure ([place])    Malignant melanoma of right great toe Stage IIIB ([id number]) 11/14/2014   Obstructive sleep apnea 07/19/2012   Paroxysmal supraventricular tachycardia ([place]) 05/24/2012   Chest pressure 05/24/2012   Palpitations 05/24/2012   Anxiety 05/24/2012    Past Medical History:  Diagnosis Date   Adrenal insufficiency ([place])    Anxiety 30/0/9233   Complication of anesthesia    SLO TO AWAKEN MANY 8 TO 9 YRS AGO, NO ISSUES SINCE   Goals of  care, counseling/discussion 12/17/2018   Gout    History of kidney stones    HLD (hyperlipidemia)    Malignant melanoma of right great toe ([place]) 11/14/2014   Neuroendocrine carcinoma of pancreas ([place]) 05/22/2019   Paroxysmal supraventricular tachycardia ([place]) 05/24/2012   Described in the treadmill report; details are pending    PE (pulmonary thromboembolism) ([place]) 08/2019   Rheumatoid arthritis ([place])    Sleep apnea     MILD NO CPAP NEEDED    Family History  Adopted: Yes  Problem Relation Age of Onset   Stroke Mother    COPD Father    Diabetes Father    Dementia Father    Diabetes Sister    Past Surgical History:  Procedure Laterality Date   CHOLECYSTECTOMY     ESOPHAGOGASTRODUODENOSCOPY (EGD) WITH PROPOFOL N/A 05/13/2019   Procedure: ESOPHAGOGASTRODUODENOSCOPY (EGD) WITH PROPOFOL;  Surgeon: Arta Silence, MD;  Location: [facility];  Service: Endoscopy;  Laterality: N/A;   EXTRACORPOREAL SHOCK WAVE LITHOTRIPSY  2016   FINE NEEDLE ASPIRATION N/A 05/13/2019   Procedure: FINE NEEDLE ASPIRATION (FNA) LINEAR;  Surgeon: Arta Silence, MD;  Location: [facility];  Service: Endoscopy;  Laterality: N/A;   LYMPH NODE BIOPSY Right 11/06/2018   Procedure: RIGHT INGUINAL LYMPH NODE BIOPSY AND POSSIBLE RIGHT GROIN NODE DISECTION;  Surgeon: Alphonsa Overall, MD;  Location: [facility];  Service: General;  Laterality: Right;   r toe partial ambutation     ROTATOR CUFF REPAIR Right 2010   3 yrs ago   UPPER ESOPHAGEAL ENDOSCOPIC ULTRASOUND (EUS) N/A 05/13/2019   Procedure: UPPER ESOPHAGEAL ENDOSCOPIC ULTRASOUND (EUS);  Surgeon: Arta Silence, MD;  Location: [facility];  Service: Endoscopy;  Laterality: N/A;   Social History   Social History Narrative   Lives with wife in a one story home.  Has 2 children.  On disability.  Education: high school.+   Immunization History  Administered Date(s) Administered   Influenza-Unspecified 04/03/2014     Objective: Vital Signs: BP 109/75 (BP Location: Right Arm, Patient Position: Sitting, Cuff Size: Normal)   Pulse 65   Resp 16   Ht 6' (1.829 m)   Wt 237 lb 12.8 oz (107.9 kg)   BMI 32.25 kg/m    Physical Exam Vitals and nursing note reviewed.  Constitutional:      Appearance: He is well-developed.  HENT:     Head: Normocephalic and atraumatic.  Eyes:     Conjunctiva/sclera: Conjunctivae normal.     Pupils: Pupils are equal, round, and reactive to light.   Cardiovascular:     Rate and Rhythm: Normal rate and regular rhythm.     Heart sounds: Normal heart sounds.  Pulmonary:     Effort: Pulmonary effort is normal.     Breath sounds: Normal breath sounds.  Abdominal:     General: Bowel sounds are normal.     Palpations: Abdomen is soft.  Musculoskeletal:     Cervical back: Normal range of motion and neck supple.  Skin:    General: Skin is warm and dry.     Capillary Refill: Capillary refill takes less than 2 seconds.  Neurological:     Mental Status: He is alert and oriented to person, place, and time.  Psychiatric:        Behavior: Behavior normal.     Musculoskeletal Exam: C-spine was in good range of motion.  Shoulder joints, elbow joints, wrist joints, MCPs PIPs and DIPs with good range of motion with no synovitis.  He had PIP and  DIP thickening.  Hip joints, knee joints, ankles, MTPs with good range of motion with no synovitis.  CDAI Exam: CDAI Score: -- Patient Global: --; Provider Global: -- Swollen: --; Tender: -- Joint Exam 12/15/2020   No joint exam has been documented for this visit   There is currently no information documented on the homunculus. Go to the Rheumatology activity and complete the homunculus joint exam.  Investigation: No additional findings.  Imaging: NM PET Image Restage (PS) Whole Body  Result Date: 12/10/2020 CLINICAL DATA:  Subsequent treatment strategy for melanoma of the great toe on the RIGHT in a 55 year old male. EXAM: NUCLEAR MEDICINE PET WHOLE BODY TECHNIQUE: 11.7 mCi F-18 FDG was injected intravenously. Full-ring PET imaging was performed from the head to foot after the radiotracer. CT data was obtained and used for attenuation correction and anatomic localization. Fasting blood glucose: 90 mg/dl COMPARISON:  Multiple prior studies most recent May 18, 2020. FINDINGS: Mediastinal blood pool activity: SUV max 2.48 HEAD/NECK: No hypermetabolic activity in the scalp. No hypermetabolic  cervical lymph nodes. Incidental CT findings: none CHEST: No hypermetabolic mediastinal or hilar nodes. No suspicious pulmonary nodules on the CT scan. Incidental CT findings: Scattered atheromatous plaque in the thoracic aorta. Normal heart size without substantial pericardial effusion. Normal caliber of central pulmonary vasculature. Limited assessment of cardiovascular structures given lack of intravenous contrast. No suspicious pulmonary nodule. Basilar atelectasis. Airways are patent. No effusion. ABDOMEN/PELVIS: RIGHT common iliac lymph node (image 205/4) 16 mm with a maximum SUV of 11.4. Signs of prior RIGHT groin dissection without focal increased metabolic activity. LEFT gluteal nodule with a maximum SUV of 4.5 increased from 2.6 on the previous study. No solid organ uptake to suggest solid organ involvement. Incidental CT findings: Hepatic cysts are similar to previous imaging. Post cholecystectomy. Pancreatic atrophy as before. Spleen, adrenal glands and kidneys without acute findings. Cortical scarring of the bilateral kidneys. Smooth contour the urinary bladder under distended. No acute gastrointestinal findings with normal appendix. Prostate unremarkable by CT. SKELETON: No focal hypermetabolic activity to suggest skeletal metastasis. Incidental CT findings: none EXTREMITIES: No abnormal hypermetabolic activity in the lower extremities. Incidental CT findings: Chronic AVN/bone infarct about the knees as before. IMPRESSION: Signs of disease recurrence in the RIGHT common iliac region with enlarged lymph node in this area displaying hypermetabolic features. LEFT gluteal nodule with increased metabolic activity also suspicious but not changed in size since previous imaging. Maximum SUV of 4.5 on today's study as compared to 2.6 on the previous exam. This could potentially represent a small injection granuloma. Scattered nodules in the soft tissues of the RIGHT gluteal region without increased metabolic  activity compatible with small injection granulomata. Other chronic findings as outlined above including aortic atherosclerosis and chronic bone infarcts. Aortic Atherosclerosis (ICD10-I70.0). Electronically Signed   By: Zetta Bills M.D.   On: 12/10/2020 09:02    Recent Labs: Lab Results  Component Value Date   WBC 3.9 (L) 12/11/2020   HGB 13.9 12/11/2020   PLT 236 12/11/2020   NA 137 12/11/2020   K 4.0 12/11/2020   CL 104 12/11/2020   CO2 23 12/11/2020   GLUCOSE 85 12/11/2020   BUN 25 (H) 12/11/2020   CREATININE 1.07 12/11/2020   BILITOT 0.9 12/11/2020   ALKPHOS 66 12/11/2020   AST 21 12/11/2020   ALT 25 12/11/2020   PROT 7.4 12/11/2020   ALBUMIN 4.6 12/11/2020   CALCIUM 9.6 12/11/2020   GFRAA 105 06/04/2020   QFTBGOLDPLUS NEGATIVE 03/25/2020  Speciality Comments: No specialty comments available.  Procedures:  No procedures performed Allergies: Patient has no known allergies.   Assessment / Plan:     Visit Diagnoses: Chronic inflammatory arthritis - History of a stage IIIb malignant melanoma with nodal mets and neuroendocrine carcinoma of the pancreas. Status post immunotherapy: He had good response to methotrexate.  He had been symptom-free for the last few months.  He recently was diagnosed with recurrence of melanoma based on the PET scan.  He had been receiving radiation therapy next week.  His last methotrexate dose was 8 days ago.  I advised him to hold methotrexate until he gets clearance from the radiation oncologist.  High risk medication use - Methotrexate 0.8 ml sq injections once weekly and folic acid 2 mg po daily.  Labs obtained on December 11, 2020 showed mild neutropenia otherwise were unremarkable.  I discussed future immunization with him and also placed information in the AVS.  He has been advised to stop methotrexate in the future if he develops any infection and resume once the infection resolves.  Primary osteoarthritis of both hands-he has bilateral PIP  and DIP thickening with no synovitis.  Primary osteoarthritis of both knees - He had bilateral knee joint cortisone injections performed on 05/22/2020.  He had no warmth swelling or effusion in his knee joints.  Primary osteoarthritis of both feet-he had no tenderness on palpation over MTPs.  Malignant melanoma of right great toe Stage IIIB ([id number])-he had recurrence of melanoma with right iliac lymph node involvement.  He will be starting radiation therapy.  Adrenal insufficiency (HCC)-he is on chronic hydrocortisone 5 mg p.o. 3 times daily.  Problems are listed as follows:  Neuroendocrine carcinoma of pancreas (HCC)  Paroxysmal supraventricular tachycardia (HCC)  History of hyperlipidemia  Bilateral pulmonary embolism (HCC)  Obstructive sleep apnea  Severe sepsis with septic shock ([place])  Drug-induced pneumonitis - VERVOY  Orders: No orders of the defined types were placed in this encounter.  No orders of the defined types were placed in this encounter.    Follow-Up Instructions: Return in about 3 months (around 03/17/2021) for Inflammatory arthritis.   Bo Merino, MD  Note - This record has been created using Editor, commissioning.  Chart creation errors have been sought, but may not always  have been located. Such creation errors do not reflect on  the standard of medical care.

## 2020-12-02 DIAGNOSIS — R591 Generalized enlarged lymph nodes: Secondary | ICD-10-CM | POA: Diagnosis not present

## 2020-12-02 DIAGNOSIS — E860 Dehydration: Secondary | ICD-10-CM | POA: Diagnosis not present

## 2020-12-02 DIAGNOSIS — R1110 Vomiting, unspecified: Secondary | ICD-10-CM | POA: Diagnosis not present

## 2020-12-02 DIAGNOSIS — Z8701 Personal history of pneumonia (recurrent): Secondary | ICD-10-CM | POA: Diagnosis not present

## 2020-12-02 DIAGNOSIS — Z20822 Contact with and (suspected) exposure to covid-19: Secondary | ICD-10-CM | POA: Diagnosis not present

## 2020-12-02 DIAGNOSIS — J189 Pneumonia, unspecified organism: Secondary | ICD-10-CM | POA: Diagnosis not present

## 2020-12-02 DIAGNOSIS — Z79899 Other long term (current) drug therapy: Secondary | ICD-10-CM | POA: Diagnosis not present

## 2020-12-03 ENCOUNTER — Telehealth: Payer: Self-pay | Admitting: *Deleted

## 2020-12-03 NOTE — Telephone Encounter (Signed)
Call received from pt.'s wife stating that pt was in the ER at [facility] yesterday and a CT was done which showed a "swollen lymph node in the pelvic region which is concerning for metastatic disease and pneumonia."  Dr. Marin Olp notified. No new orders received at this time. Pt has a PET scan scheduled for 12/09/20 and MD appt on 12/11/20.

## 2020-12-09 ENCOUNTER — Other Ambulatory Visit: Payer: Self-pay

## 2020-12-09 ENCOUNTER — Encounter (HOSPITAL_COMMUNITY)
Admission: RE | Admit: 2020-12-09 | Discharge: 2020-12-09 | Disposition: A | Discharge status: Home / Self Care | Payer: PPO | Source: Ambulatory Visit | Attending: Hematology & Oncology | Admitting: Hematology & Oncology

## 2020-12-09 DIAGNOSIS — Z79899 Other long term (current) drug therapy: Secondary | ICD-10-CM | POA: Insufficient documentation

## 2020-12-09 DIAGNOSIS — J9811 Atelectasis: Secondary | ICD-10-CM | POA: Diagnosis not present

## 2020-12-09 DIAGNOSIS — C4371 Malignant melanoma of right lower limb, including hip: Secondary | ICD-10-CM | POA: Insufficient documentation

## 2020-12-09 DIAGNOSIS — J9859 Other diseases of mediastinum, not elsewhere classified: Secondary | ICD-10-CM | POA: Diagnosis not present

## 2020-12-09 DIAGNOSIS — K7689 Other specified diseases of liver: Secondary | ICD-10-CM | POA: Diagnosis not present

## 2020-12-09 LAB — GLUCOSE, CAPILLARY: Glucose-Capillary: 90 mg/dL (ref 70–99)

## 2020-12-09 MED ORDER — FLUDEOXYGLUCOSE F - 18 (FDG) INJECTION
12.0000 | Freq: Once | INTRAVENOUS | Status: AC | PRN
  Administered 2020-12-09: 11.7 via INTRAVENOUS

## 2020-12-11 ENCOUNTER — Inpatient Hospital Stay (HOSPITAL_BASED_OUTPATIENT_CLINIC_OR_DEPARTMENT_OTHER): Payer: PPO | Admitting: Hematology & Oncology

## 2020-12-11 ENCOUNTER — Inpatient Hospital Stay: Payer: PPO

## 2020-12-11 ENCOUNTER — Encounter: Payer: Self-pay | Admitting: Radiation Oncology

## 2020-12-11 ENCOUNTER — Encounter: Payer: Self-pay | Admitting: Hematology & Oncology

## 2020-12-11 ENCOUNTER — Other Ambulatory Visit: Payer: Self-pay

## 2020-12-11 ENCOUNTER — Telehealth: Payer: Self-pay | Admitting: *Deleted

## 2020-12-11 VITALS — BP 96/73 | HR 68 | Temp 97.9°F | Resp 18 | Wt 232.0 lb

## 2020-12-11 DIAGNOSIS — D509 Iron deficiency anemia, unspecified: Secondary | ICD-10-CM | POA: Diagnosis not present

## 2020-12-11 DIAGNOSIS — C7A8 Other malignant neuroendocrine tumors: Secondary | ICD-10-CM

## 2020-12-11 DIAGNOSIS — C4371 Malignant melanoma of right lower limb, including hip: Secondary | ICD-10-CM

## 2020-12-11 DIAGNOSIS — Z79899 Other long term (current) drug therapy: Secondary | ICD-10-CM | POA: Diagnosis not present

## 2020-12-11 DIAGNOSIS — D696 Thrombocytopenia, unspecified: Secondary | ICD-10-CM

## 2020-12-11 DIAGNOSIS — C779 Secondary and unspecified malignant neoplasm of lymph node, unspecified: Secondary | ICD-10-CM | POA: Diagnosis not present

## 2020-12-11 DIAGNOSIS — Z923 Personal history of irradiation: Secondary | ICD-10-CM | POA: Diagnosis not present

## 2020-12-11 LAB — CMP (CANCER CENTER ONLY)
ALT: 25 U/L (ref 0–44)
AST: 21 U/L (ref 15–41)
Albumin: 4.6 g/dL (ref 3.5–5.0)
Alkaline Phosphatase: 66 U/L (ref 38–126)
Anion gap: 10 (ref 5–15)
BUN: 25 mg/dL — ABNORMAL HIGH (ref 6–20)
CO2: 23 mmol/L (ref 22–32)
Calcium: 9.6 mg/dL (ref 8.9–10.3)
Chloride: 104 mmol/L (ref 98–111)
Creatinine: 1.07 mg/dL (ref 0.61–1.24)
GFR, Estimated: >60 mL/min (ref >60)
Glucose, Bld: 85 mg/dL (ref 70–99)
Potassium: 4 mmol/L (ref 3.5–5.1)
Sodium: 137 mmol/L (ref 135–145)
Total Bilirubin: 0.9 mg/dL (ref 0.3–1.2)
Total Protein: 7.4 g/dL (ref 6.5–8.1)

## 2020-12-11 LAB — CBC WITH DIFFERENTIAL (CANCER CENTER ONLY)
Abs Immature Granulocytes: 0.02 10*3/uL (ref 0.00–0.07)
Basophils Absolute: 0 10*3/uL (ref 0.0–0.1)
Basophils Relative: 1 %
Eosinophils Absolute: 0.4 10*3/uL (ref 0.0–0.5)
Eosinophils Relative: 11 %
HCT: 39.8 % (ref 39.0–52.0)
Hemoglobin: 13.9 g/dL (ref 13.0–17.0)
Immature Granulocytes: 1 %
Lymphocytes Relative: 28 %
Lymphs Abs: 1.1 10*3/uL (ref 0.7–4.0)
MCH: 33.4 pg (ref 26.0–34.0)
MCHC: 34.9 g/dL (ref 30.0–36.0)
MCV: 95.7 fL (ref 80.0–100.0)
Monocytes Absolute: 0.3 10*3/uL (ref 0.1–1.0)
Monocytes Relative: 9 %
Neutro Abs: 2 10*3/uL (ref 1.7–7.7)
Neutrophils Relative %: 50 %
Platelet Count: 236 10*3/uL (ref 150–400)
RBC: 4.16 MIL/uL — ABNORMAL LOW (ref 4.22–5.81)
RDW: 12.8 % (ref 11.5–15.5)
WBC Count: 3.9 10*3/uL — ABNORMAL LOW (ref 4.0–10.5)
nRBC: 0 % (ref 0.0–0.2)

## 2020-12-11 LAB — FERRITIN: Ferritin: 1845 ng/mL — ABNORMAL HIGH (ref 24–336)

## 2020-12-11 LAB — IRON AND TIBC
Iron: 149 ug/dL (ref 42–163)
Saturation Ratios: 60 % — ABNORMAL HIGH (ref 20–55)
TIBC: 249 ug/dL (ref 202–409)
UIBC: 99 ug/dL — ABNORMAL LOW (ref 117–376)

## 2020-12-11 LAB — LACTATE DEHYDROGENASE: LDH: 150 U/L (ref 98–192)

## 2020-12-11 NOTE — Progress Notes (Signed)
  Radiation Oncology         [phone]) [phone] ________________________________  Name: James Byrd MRN: [id number]  Date: 12/11/2020  DOB: [birth year]  Chart Note:  I got a call from Dr. Marin Olp and reviewed this patient's most recent findings and wanted to take a minute to document my impression.  He has an isolated solitary right common iliac lymph node oligometastasis from melanoma which appears amenable to SBRT    Radiographic Findings: NM PET Image Restage (PS) Whole Body  Result Date: 12/10/2020 CLINICAL DATA:  Subsequent treatment strategy for melanoma of the great toe on the RIGHT in a 55 year old male. EXAM: NUCLEAR MEDICINE PET WHOLE BODY TECHNIQUE: 11.7 mCi F-18 FDG was injected intravenously. Full-ring PET imaging was performed from the head to foot after the radiotracer. CT data was obtained and used for attenuation correction and anatomic localization. Fasting blood glucose: 90 mg/dl COMPARISON:  Multiple prior studies most recent May 18, 2020. FINDINGS: Mediastinal blood pool activity: SUV max 2.48 HEAD/NECK: No hypermetabolic activity in the scalp. No hypermetabolic cervical lymph nodes. Incidental CT findings: none CHEST: No hypermetabolic mediastinal or hilar nodes. No suspicious pulmonary nodules on the CT scan. Incidental CT findings: Scattered atheromatous plaque in the thoracic aorta. Normal heart size without substantial pericardial effusion. Normal caliber of central pulmonary vasculature. Limited assessment of cardiovascular structures given lack of intravenous contrast. No suspicious pulmonary nodule. Basilar atelectasis. Airways are patent. No effusion. ABDOMEN/PELVIS: RIGHT common iliac lymph node (image 205/4) 16 mm with a maximum SUV of 11.4. Signs of prior RIGHT groin dissection without focal increased metabolic activity. LEFT gluteal nodule with a maximum SUV of 4.5 increased from 2.6 on the previous study. No solid organ uptake to suggest solid organ involvement.  Incidental CT findings: Hepatic cysts are similar to previous imaging. Post cholecystectomy. Pancreatic atrophy as before. Spleen, adrenal glands and kidneys without acute findings. Cortical scarring of the bilateral kidneys. Smooth contour the urinary bladder under distended. No acute gastrointestinal findings with normal appendix. Prostate unremarkable by CT. SKELETON: No focal hypermetabolic activity to suggest skeletal metastasis. Incidental CT findings: none EXTREMITIES: No abnormal hypermetabolic activity in the lower extremities. Incidental CT findings: Chronic AVN/bone infarct about the knees as before. IMPRESSION: Signs of disease recurrence in the RIGHT common iliac region with enlarged lymph node in this area displaying hypermetabolic features. LEFT gluteal nodule with increased metabolic activity also suspicious but not changed in size since previous imaging. Maximum SUV of 4.5 on today's study as compared to 2.6 on the previous exam. This could potentially represent a small injection granuloma. Scattered nodules in the soft tissues of the RIGHT gluteal region without increased metabolic activity compatible with small injection granulomata. Other chronic findings as outlined above including aortic atherosclerosis and chronic bone infarcts. Aortic Atherosclerosis (ICD10-I70.0). Electronically Signed   By: Zetta Bills M.D.   On: 12/10/2020 09:02    Impression/Plan:  In light of this information, we will arrange reconsultation to discuss SBRT    ________________________________  Sheral Apley. Tammi Klippel, M.D.

## 2020-12-11 NOTE — Telephone Encounter (Signed)
Per 12/11/20 los - called patient and gave upcoming appointments - patient confirmed - mailed calendar

## 2020-12-11 NOTE — Progress Notes (Signed)
Hematology and Oncology Follow Up Visit  James Byrd [id number] 11-23-1965 55 y.o. 12/11/2020   Principle Diagnosis:  Stage IIIB ([id number]) subungual melanoma of the right hallux -- nodal recurrence in the RIGHT inguinal nodes -- BRAF wt RIGHT Common Iliac node recurrence Neuroendocrine carcinoma of the pancreas Iron def anemia  Current Therapy:   Yervoy-status post 3 cycles - discontinued in September 2016 secondary to toxicity  XRT to the RIGHT inguinal basin Nivolumab 400 mg q month -- s/p cycle #8 -start in 12/2018 - d/c on 08/2019 due to toxicity Somatuline 120 mg IM q month -- started on 05/22/2019 - d/c on 08/2019 IV Iron -- Iron Dextran given on 09/18/2019   Interim History:  James Byrd is here today for follow-up.  He apparently had pneumonia recently.  I am unsure as to why he had the pneumonia.  I will know if you might be from the medication gets for his rheumatoid arthritis.  We did do a PET scan on him.  This was done couple days ago.  Unfortunate, looks like he does have another recurrence.  The recurrence is in the right iliac lymph node.  This in the common iliac lymph node.  The lymph node was only 16 mm.  However the SUV is 11.4.  This is quite concerning.  It does not surprise me that he would have a recurrence.  Unfortunate, it is only been 16 months since he completed his radiation therapy to the right inguinal area after having a right inguinal recurrence.  At least, his arthritis is doing well.  He has had no swelling.  He has had no knee pain.  He is more active.  He is out mowing the yard.  He and his family did go to the beach I think over [place] weekend.  He is still coughing a little bit.  He has had no nausea or vomiting.  There has been no bleeding.  He has had no change in bowel or bladder habits.  Overall, his performance status is ECOG 1.     Allergies: No Known Allergies  Past Medical History, Surgical history, Social history, and  Family History were reviewed and updated.  Review of Systems: Review of Systems  Constitutional:  Positive for malaise/fatigue.  HENT: Negative.    Eyes: Negative.   Respiratory:  Positive for shortness of breath.   Cardiovascular: Negative.   Gastrointestinal:  Positive for constipation and nausea.  Genitourinary: Negative.   Musculoskeletal:  Positive for joint pain and myalgias.  Skin: Negative.   Neurological:  Positive for tingling and focal weakness.  Endo/Heme/Allergies: Negative.   Psychiatric/Behavioral:  Positive for memory loss.     Physical Exam:  weight is 232 lb (105.2 kg). His oral temperature is 97.9 F (36.6 C). His blood pressure is 96/73 and his pulse is 68. His respiration is 18 and oxygen saturation is 96%.   Wt Readings from Last 3 Encounters:  12/11/20 232 lb (105.2 kg)  07/24/20 240 lb 6.4 oz (109 kg)  05/22/20 246 lb (111.6 kg)    Physical Exam Vitals reviewed.  HENT:     Head: Normocephalic and atraumatic.  Eyes:     Pupils: Pupils are equal, round, and reactive to light.  Cardiovascular:     Rate and Rhythm: Normal rate and regular rhythm.     Heart sounds: Normal heart sounds.  Pulmonary:     Effort: Pulmonary effort is normal.     Breath sounds: Normal breath sounds.  Abdominal:  General: Bowel sounds are normal.     Palpations: Abdomen is soft.     Comments: Abdominal exam shows a soft abdomen.  Bowel sounds are present.  There is no palpable abdominal mass.  There is no palpable liver or spleen tip.  He has a healing right inguinal lymphadenectomy scar.  I cannot palpate any obvious adenopathy.  The left inguinal area is unremarkable.    Musculoskeletal:        General: No tenderness or deformity. Normal range of motion.     Cervical back: Normal range of motion.     Comments: The big toe on the right foot is amputated.  There is no lymphedema in the right leg.  He has good pulses in his distal extremities.  Left leg is unremarkable.   Lymphadenopathy:     Cervical: No cervical adenopathy.  Skin:    General: Skin is warm and dry.     Findings: No erythema or rash.  Neurological:     Mental Status: He is alert and oriented to person, place, and time.  Psychiatric:        Behavior: Behavior normal.        Thought Content: Thought content normal.        Judgment: Judgment normal.     Lab Results  Component Value Date   WBC 3.9 (L) 12/11/2020   HGB 13.9 12/11/2020   HCT 39.8 12/11/2020   MCV 95.7 12/11/2020   PLT 236 12/11/2020   Lab Results  Component Value Date   FERRITIN 1,228 (H) 02/20/2020   IRON 87 02/20/2020   TIBC 225 02/20/2020   UIBC 138 02/20/2020   IRONPCTSAT 39 02/20/2020   Lab Results  Component Value Date   RETICCTPCT 1.6 11/27/2019   RBC 4.16 (L) 12/11/2020   No results found for: Nils Pyle [facility] Lab Results  Component Value Date   IGGSERUM 1,046 03/25/2020   IGMSERUM 64 03/25/2020   Lab Results  Component Value Date   ALBUMINELP 4.3 03/25/2020   A1GS 0.3 03/25/2020   A2GS 0.7 03/25/2020   BETS 0.4 03/25/2020   BETA2SER 0.3 03/25/2020   GAMS 0.9 03/25/2020   SPEI  03/25/2020     Comment:     Normal Serum Protein Electrophoresis Pattern. No abnormal protein bands (M-protein) detected.      Chemistry      Component Value Date/Time   NA 137 12/11/2020 0755   NA 138 06/04/2020 1003   NA 145 06/06/2017 1316   NA 141 10/16/2015 1334   K 4.0 12/11/2020 0755   K 4.1 06/06/2017 1316   K 3.9 10/16/2015 1334   CL 104 12/11/2020 0755   CL 108 06/06/2017 1316   CO2 23 12/11/2020 0755   CO2 26 06/06/2017 1316   CO2 21 (L) 10/16/2015 1334   BUN 25 (H) 12/11/2020 0755   BUN 17 06/04/2020 1003   BUN 19 06/06/2017 1316   BUN 20.0 10/16/2015 1334   CREATININE 1.07 12/11/2020 0755   CREATININE 1.0 06/06/2017 1316   CREATININE 1.0 10/16/2015 1334      Component Value Date/Time   CALCIUM 9.6 12/11/2020 0755   CALCIUM 9.4 06/06/2017 1316   CALCIUM 9.7  10/16/2015 1334   ALKPHOS 66 12/11/2020 0755   ALKPHOS 78 06/06/2017 1316   ALKPHOS 62 10/16/2015 1334   AST 21 12/11/2020 0755   AST 16 10/16/2015 1334   ALT 25 12/11/2020 0755   ALT 27 06/06/2017 1316   ALT 11 10/16/2015 1334  BILITOT 0.9 12/11/2020 0755   BILITOT 0.49 10/16/2015 1334      Impression and Plan: Mr. Vandyken is a very pleasant 55 yo caucasian gentleman with history of stage IIIB melanoma of the right hallux that was subungual with one microscopic positive inguinal lymph node. He completed 3 cycles of Yervoy but stopped due to side effects.   He then had a recurrence in the right inguinal area.  He had this resected.  He then underwent radiation therapy.  This was completed I think in March 2021.  Now it looks like he has a another recurrence.  This appears to be oligometastatic.  I  spoke with Dr. Tammi Klippel of Radiation Oncology.  I thought maybe that stereotactic radiosurgery would not be a bad idea to try to treat this.  It is a small lymph node.  I really think that radiosurgery would be reasonable since there is oligometastatic disease.  I had a long talk with Mr. Gaut and his wife.  I have known him for quite a while.  I know that their faith is incredibly strong.  At this point, I would likely plan to get him back in about 6 weeks.  I would think that the radiosurgery would start after the July 4 weekend.  I probably would not do a another PET scan for about 2 months after he completes radiosurgery.        [name], MD 6/24/20229:03 AM

## 2020-12-12 ENCOUNTER — Other Ambulatory Visit: Payer: Self-pay | Admitting: Physician Assistant

## 2020-12-14 NOTE — Telephone Encounter (Signed)
Next Visit: 12/15/2020  Last Visit: 07/24/2020  Last Fill: 07/24/2020  DX: Chronic inflammatory arthritis   Current Dose per office note on 07/24/2020: Methotrexate 0.8 ml sq injections once weekly  Labs: 12/11/2020 BUN 25, WBC 3.9, RBC 4.16  Okay to refill methotrexate?

## 2020-12-15 ENCOUNTER — Ambulatory Visit (INDEPENDENT_AMBULATORY_CARE_PROVIDER_SITE_OTHER): Payer: PPO | Admitting: Rheumatology

## 2020-12-15 ENCOUNTER — Encounter: Payer: Self-pay | Admitting: Rheumatology

## 2020-12-15 ENCOUNTER — Encounter: Payer: Self-pay | Admitting: *Deleted

## 2020-12-15 ENCOUNTER — Other Ambulatory Visit: Payer: Self-pay

## 2020-12-15 VITALS — BP 109/75 | HR 65 | Resp 16 | Ht 72.0 in | Wt 237.8 lb

## 2020-12-15 DIAGNOSIS — I471 Supraventricular tachycardia: Secondary | ICD-10-CM

## 2020-12-15 DIAGNOSIS — J189 Pneumonia, unspecified organism: Secondary | ICD-10-CM

## 2020-12-15 DIAGNOSIS — M19072 Primary osteoarthritis, left ankle and foot: Secondary | ICD-10-CM

## 2020-12-15 DIAGNOSIS — M19041 Primary osteoarthritis, right hand: Secondary | ICD-10-CM

## 2020-12-15 DIAGNOSIS — M19042 Primary osteoarthritis, left hand: Secondary | ICD-10-CM

## 2020-12-15 DIAGNOSIS — M170 Bilateral primary osteoarthritis of knee: Secondary | ICD-10-CM

## 2020-12-15 DIAGNOSIS — M19071 Primary osteoarthritis, right ankle and foot: Secondary | ICD-10-CM

## 2020-12-15 DIAGNOSIS — A419 Sepsis, unspecified organism: Secondary | ICD-10-CM

## 2020-12-15 DIAGNOSIS — I2699 Other pulmonary embolism without acute cor pulmonale: Secondary | ICD-10-CM | POA: Diagnosis not present

## 2020-12-15 DIAGNOSIS — Z79899 Other long term (current) drug therapy: Secondary | ICD-10-CM | POA: Diagnosis not present

## 2020-12-15 DIAGNOSIS — T50905A Adverse effect of unspecified drugs, medicaments and biological substances, initial encounter: Secondary | ICD-10-CM

## 2020-12-15 DIAGNOSIS — C4371 Malignant melanoma of right lower limb, including hip: Secondary | ICD-10-CM

## 2020-12-15 DIAGNOSIS — E2740 Unspecified adrenocortical insufficiency: Secondary | ICD-10-CM | POA: Diagnosis not present

## 2020-12-15 DIAGNOSIS — Z8639 Personal history of other endocrine, nutritional and metabolic disease: Secondary | ICD-10-CM

## 2020-12-15 DIAGNOSIS — M1990 Unspecified osteoarthritis, unspecified site: Secondary | ICD-10-CM | POA: Diagnosis not present

## 2020-12-15 DIAGNOSIS — R6521 Severe sepsis with septic shock: Secondary | ICD-10-CM

## 2020-12-15 DIAGNOSIS — G4733 Obstructive sleep apnea (adult) (pediatric): Secondary | ICD-10-CM

## 2020-12-15 DIAGNOSIS — C7A8 Other malignant neuroendocrine tumors: Secondary | ICD-10-CM

## 2020-12-15 NOTE — Patient Instructions (Signed)
Standing Labs We placed an order today for your standing lab work.   Please have your standing labs drawn in September and every 3 months  If possible, please have your labs drawn 2 weeks prior to your appointment so that the provider can discuss your results at your appointment.  Please note that you may see your imaging and lab results in [place] before we have reviewed them. We may be awaiting multiple results to interpret others before contacting you. Please allow our office up to 72 hours to thoroughly review all of the results before contacting the office for clarification of your results.  We have open lab daily: Monday through Thursday from 1:30-4:30 PM and Friday from 1:30-4:00 PM at the office of Dr. Bo Merino, [facility].   Please be advised, all patients with office appointments requiring lab work will take precedent over walk-in lab work.  If possible, please come for your lab work on Monday and Friday afternoons, as you may experience shorter wait times. The office is located at [address], [place], [place], [place] [address] No appointment is necessary.   Labs are drawn by [facility]. Please bring your co-pay at the time of your lab draw.  You may receive a bill from [facility] for your lab work.  If you wish to have your labs drawn at another location, please call the office 24 hours in advance to send orders.  If you have any questions regarding directions or hours of operation,  please call [phone].   As a reminder, please drink plenty of water prior to coming for your lab work. Thanks!   Vaccines You are taking a medication(s) that can suppress your immune system.  The following immunizations are recommended: Flu annually Covid-19  Td/Tdap (tetanus, diphtheria, pertussis) every 10 years Pneumonia (Prevnar 15 then Pneumovax 23 at least 1 year apart.  Alternatively, can take Prevnar 20 without needing additional dose) Shingrix (after age 55): 2  doses from 4 weeks to 6 months apart  Please check with your PCP to make sure you are up to date.   If you test POSITIVE for COVID19 and have MILD to MODERATE symptoms: First, call your PCP if you would like to receive COVID19 treatment AND Hold your medications during the infection and for at least 1 week after your symptoms have resolved: Injectable medication (Benlysta, Cimzia, Cosentyx, Enbrel, Humira, Orencia, Remicade, Simponi, Stelara, Taltz, Tremfya) Methotrexate Leflunomide (Arava) Mycophenolate (Cellcept) Morrie Sheldon, Olumiant, or Rinvoq If you take Actemra or Kevzara, you DO NOT need to hold these for COVID19 infection.  If you test POSITIVE for COVID19 and have NO symptoms: First, call your PCP if you would like to receive COVID19 treatment AND Hold your medications for at least 10 days after the day that you tested positive Injectable medication (Benlysta, Cimzia, Cosentyx, Enbrel, Humira, Orencia, Remicade, Simponi, Stelara, Taltz, Tremfya) Methotrexate Leflunomide (Arava) Mycophenolate (Cellcept) Morrie Sheldon, Olumiant, or Rinvoq If you take Actemra or Kevzara, you DO NOT need to hold these for COVID19 infection.  If you have signs or symptoms of an infection or start antibiotics: First, call your PCP for workup of your infection. Hold your medication through the infection, until you complete your antibiotics, and until symptoms resolve if you take the following: Injectable medication (Actemra, Benlysta, Cimzia, Cosentyx, Enbrel, Humira, Kevzara, Orencia, Remicade, Simponi, Stelara, Taltz, Tremfya) Methotrexate Leflunomide (Arava) Mycophenolate (Cellcept) Morrie Sheldon, Olumiant, or Rinvoq

## 2020-12-15 NOTE — Progress Notes (Signed)
Faxed last office note, 12/11/2020, to Carolyn's work fax number, per her request. Fax # is [phone].

## 2020-12-15 NOTE — Progress Notes (Addendum)
Histology and Location of Primary Cancer:  Stage IIIB ([id number]) subungual melanoma of the right hallux (nodal recurrence in the RIGHT inguinal nodes -- BRAF with RIGHT Common Iliac node recurrence)  Location(s) of Symptomatic tumor(s):  PET Scan 12/09/2020 IMPRESSION: --Signs of disease recurrence in the RIGHT common iliac region with enlarged lymph node in this area displaying hypermetabolic features. --LEFT gluteal nodule with increased metabolic activity also suspicious but not changed in size since previous imaging. Maximum SUV of 4.5 on today's study as compared to 2.6 on the previous exam. This could potentially represent a small injection granuloma. --Scattered nodules in the soft tissues of the RIGHT gluteal region without increased metabolic activity compatible with small injection granulomata. --Other chronic findings as outlined above including aortic atherosclerosis and chronic bone infarcts. --Aortic Atherosclerosis (ICD10-I70.0).  Past/Anticipated chemotherapy by medical oncology, if any:  Under care of Dr. Burney Gauze 12/11/2020 Current Therapy:        --Yervoy-status post 3 cycles - discontinued in September 2016 secondary to toxicity --XRT to the RIGHT inguinal basin --Nivolumab 400 mg q month -- s/p cycle #8 -start in 12/2018 - d/c on 08/2019 due to toxicity --Somatuline 120 mg IM q month -- started on 05/22/2019 - d/c on 08/2019 --IV Iron -- Iron Dextran given on 09/18/2019 --At this point, I would likely plan to get him back in about 6 weeks.  I would think that the radiosurgery would start after the July 4 weekend. --I probably would not do a another PET scan for about 2 months after he completes radiosurgery  Pain on a scale of 0-10 is: Patient denies any current pain/discomfort   Bowel/Bladder retention or incontinence (please describe): Patient denies and issues with diarrhea, constipation, dysuria, hematuria, or change in bowel/bladder habits  Ambulatory  status? Walker? Wheelchair?: Able to ambulate unassisted  SAFETY ISSUES: Prior radiation? Yes 12/27/2018--01/29/2019 (Dr. Gery Pray) Site Technique Total Dose Dose per Fx Completed Fx Beam Energies  Extremity: Ext_Rt_rt ingu 3D 48/48 2.4 20/20 6X, 10X  Pacemaker/ICD? No Possible current pregnancy? N/A Is the patient on methotrexate? Last received on 12/07/2020 (39m/2mL weekly injections by Rheumatologist Dr. DGaren Grams. Reports Dr. DGaren Gramsplans to hold future injections until after patient finishes radiation  Additional Complaints / other details:  Finishing up a course of Amoxicillin for recent pneumonia diagnosis

## 2020-12-16 ENCOUNTER — Ambulatory Visit
Admission: RE | Admit: 2020-12-16 | Discharge: 2020-12-16 | Disposition: A | Discharge status: Home / Self Care | Payer: PPO | Source: Ambulatory Visit | Attending: Radiation Oncology | Admitting: Radiation Oncology

## 2020-12-16 DIAGNOSIS — E2740 Unspecified adrenocortical insufficiency: Secondary | ICD-10-CM | POA: Insufficient documentation

## 2020-12-16 DIAGNOSIS — C775 Secondary and unspecified malignant neoplasm of intrapelvic lymph nodes: Secondary | ICD-10-CM | POA: Insufficient documentation

## 2020-12-16 DIAGNOSIS — C779 Secondary and unspecified malignant neoplasm of lymph node, unspecified: Secondary | ICD-10-CM | POA: Diagnosis not present

## 2020-12-16 DIAGNOSIS — C4371 Malignant melanoma of right lower limb, including hip: Secondary | ICD-10-CM | POA: Insufficient documentation

## 2020-12-16 DIAGNOSIS — I471 Supraventricular tachycardia: Secondary | ICD-10-CM | POA: Insufficient documentation

## 2020-12-16 DIAGNOSIS — I700 Atherosclerosis of aorta: Secondary | ICD-10-CM | POA: Insufficient documentation

## 2020-12-16 DIAGNOSIS — M069 Rheumatoid arthritis, unspecified: Secondary | ICD-10-CM | POA: Insufficient documentation

## 2020-12-16 NOTE — Progress Notes (Signed)
  Radiation Oncology         [phone]) [phone] ________________________________  Name: James Byrd MRN: [id number]  Date: 12/16/2020  DOB: 1965/07/23  STEREOTACTIC BODY RADIOTHERAPY SIMULATION AND TREATMENT PLANNING NOTE    ICD-10-CM   1. Metastatic melanoma to lymph node (HCC)  C77.9       DIAGNOSIS:  55 y.o. with recurrent metastatic melanoma involving a solitary right common iliac lymph node and a left gluteal nodule.   NARRATIVE:  The patient was brought to the [facility].  Identity was confirmed.  All relevant records and images related to the planned course of therapy were reviewed.  The patient freely provided informed written consent to proceed with treatment after reviewing the details related to the planned course of therapy. The consent form was witnessed and verified by the simulation staff.  Then, the patient was set-up in a stable reproducible  supine position for radiation therapy.  A BodyFix immobilization pillow was fabricated for reproducible positioning.  Surface markings were placed.  The CT images were loaded into the planning software.  The gross target volumes (GTV) and planning target volumes (PTV) were delinieated, and avoidance structures were contoured.  Treatment planning then occurred.  The radiation prescription was entered and confirmed.  A total of two complex treatment devices were fabricated in the form of the BodyFix immobilization pillow and a neck accuform cushion.  I have requested : 3D Simulation  I have requested a DVH of the following structures: targets and all normal structures near the target including bladder, bowel and skin as noted on the radiation plan to maintain doses in adherence with established limits  SPECIAL TREATMENT PROCEDURE:  The planned course of therapy using radiation constitutes a special treatment procedure. Special care is required in the management of this patient for the following reasons. High dose per fraction  requiring special monitoring for increased toxicities of treatment including daily imaging..  The special nature of the planned course of radiotherapy will require increased physician supervision and oversight to ensure patient's safety with optimal treatment outcomes.  PLAN:  The patient will receive 50 Gy in 5 fractions to the lymph node and gluteal nodule.  ________________________________  Sheral Apley. Tammi Klippel, M.D.

## 2020-12-16 NOTE — Progress Notes (Signed)
Radiation Oncology         [phone] ________________________________  Initial outpatient Consultation  Name: James Byrd MRN: [id number]  Date of Service: 12/16/2020 DOB: July 12, 1965  DH:RCBULA, Sharon Mt, MD  [name], MD   REFERRING PHYSICIAN: [name], MD  DIAGNOSIS: 55 y.o. with recurrent metastatic melanoma involving a solitary right common iliac lymph node and a left gluteal nodule.    ICD-10-CM   1. Metastatic melanoma to lymph node (HCC)  C77.9       HISTORY OF PRESENT ILLNESS: James Byrd is a 55 y.o. male seen at the request of Dr. Marin Olp.  He has a history of stage IIIb ([id number]) subungual melanoma of the right great toe and has been followed by Dr. Marin Olp since around May 2016.  He was initially treated with amputation of the right great toe but had a positive sentinel node so he went on to complete 3 cycles of Yervoy which was discontinued secondary to severe toxicity.  His restaging CT scans were without any evidence of disease recurrence until a PET scan on 10/30/2018 showed new right inguinal lymphadenopathy but no other active sites of disease.  He underwent right inguinal node biopsy and dissection on 11/06/2018 with 2 of 7 nodes found to be positive and extracapsular extension present.  Therefore, he was seen in consult with Dr. Sondra Come on 12/19/2018 and elected to proceed with a 20 fraction course of postoperative radiotherapy to the surgical bed completed in August 2020.  He was then treated with nivolumab (start in 12/2018) and Somatuline 120 mg IM q month -- started on 05/22/2019.  He completed 8 cycles of the nivolumab therapy but discontinued this in March 2021 secondary to toxicity resulting in significant joint pain.  He continued on observation only with a restaging PET scan in November 2021 showing no compelling evidence of active malignancy.    He had a recent CT A/P at the time of an ED visit in [facility] for work-up of nausea and vomiting  on 12/01/2020.  Unfortunately, this showed a new nodule in the pelvis so he had a PET scan for further evaluation on 12/10/2020 which confirmed recurrent disease in a solitary, 23mm right common iliac node and a left gluteal nodule which appear amenable to SBRT.  The left gluteal nodule was noted to have increased metabolic activity as compared to prior imaging from November 2021 with a current SUV of 4.5 as compared to 2.6 in November 2021.  There was some question as to whether this could potentially represent a small injection granuloma but the patient reports that he has not had any recent injections in the left gluteus to his knowledge.     He is referred back today for discussion of the potential role for stereotactic body radiotherapy in the management of this recurrent oligometastatic disease.  PREVIOUS RADIATION THERAPY: Yes   12/27/2018 through 01/29/2019 Site Technique Total Dose Dose per Fx Completed Fx Beam Energies  Extremity: Ext_Rt_rt inguinal 3D 48/48 Gy 2.4 20/20 6X, 10X    PAST MEDICAL HISTORY:  Past Medical History:  Diagnosis Date   Adrenal insufficiency ([place])    Anxiety 32/06/2246   Complication of anesthesia    SLO TO AWAKEN MANY 8 TO 9 YRS AGO, NO ISSUES SINCE   Goals of care, counseling/discussion 12/17/2018   Gout    History of kidney stones    HLD (hyperlipidemia)    Malignant melanoma of right great toe ([place]) 11/14/2014   Neuroendocrine carcinoma of pancreas (  [place]) 05/22/2019   Paroxysmal supraventricular tachycardia ([place]) 05/24/2012   Described in the treadmill report; details are pending    PE (pulmonary thromboembolism) ([place]) 08/2019   Rheumatoid arthritis ([place])    Sleep apnea    MILD NO CPAP NEEDED      PAST SURGICAL HISTORY: Past Surgical History:  Procedure Laterality Date   CHOLECYSTECTOMY     ESOPHAGOGASTRODUODENOSCOPY (EGD) WITH PROPOFOL N/A 05/13/2019   Procedure: ESOPHAGOGASTRODUODENOSCOPY (EGD) WITH PROPOFOL;  Surgeon: Arta Silence, MD;   Location: [facility];  Service: Endoscopy;  Laterality: N/A;   EXTRACORPOREAL SHOCK WAVE LITHOTRIPSY  2016   FINE NEEDLE ASPIRATION N/A 05/13/2019   Procedure: FINE NEEDLE ASPIRATION (FNA) LINEAR;  Surgeon: Arta Silence, MD;  Location: [facility];  Service: Endoscopy;  Laterality: N/A;   LYMPH NODE BIOPSY Right 11/06/2018   Procedure: RIGHT INGUINAL LYMPH NODE BIOPSY AND POSSIBLE RIGHT GROIN NODE DISECTION;  Surgeon: Alphonsa Overall, MD;  Location: [facility];  Service: General;  Laterality: Right;   r toe partial ambutation     ROTATOR CUFF REPAIR Right 2010   3 yrs ago   UPPER ESOPHAGEAL ENDOSCOPIC ULTRASOUND (EUS) N/A 05/13/2019   Procedure: UPPER ESOPHAGEAL ENDOSCOPIC ULTRASOUND (EUS);  Surgeon: Arta Silence, MD;  Location: [facility];  Service: Endoscopy;  Laterality: N/A;    FAMILY HISTORY:  Family History  Adopted: Yes  Problem Relation Age of Onset   Stroke Mother    COPD Father    Diabetes Father    Dementia Father    Diabetes Sister     SOCIAL HISTORY:  Social History   Socioeconomic History   Marital status: Married    Spouse name: Not on file   Number of children: Not on file   Years of education: Not on file   Highest education level: Not on file  Occupational History   Not on file  Tobacco Use   Smoking status: Never   Smokeless tobacco: Never  Vaping Use   Vaping Use: Never used  Substance and Sexual Activity   Alcohol use: No    Alcohol/week: 0.0 standard drinks   Drug use: No   Sexual activity: Yes  Other Topics Concern   Not on file  Social History Narrative   Lives with wife in a one story home.  Has 2 children.  On disability.  Education: high school.+   Social Determinants of Radio broadcast assistant Strain: Not on file  Food Insecurity: Not on file  Transportation Needs: Not on file  Physical Activity: Not on file  Stress: Not on file  Social Connections: Not on file  Intimate Partner Violence: Not on file    ALLERGIES:  Patient has no known allergies.  MEDICATIONS:  Current Outpatient Medications  Medication Sig Dispense Refill   Methotrexate Sodium (METHOTREXATE, PF,) 50 MG/2ML injection INJECT 0.66ml SUBCUTANEOUSLY ONCE A WEEK (Patient not taking: Reported on 12/15/2020) 10 mL 0   acetaminophen (TYLENOL) 650 MG CR tablet Take 1,300 mg by mouth daily as needed for pain.     amoxicillin (AMOXIL) 500 MG capsule Take 500 mg by mouth 3 (three) times daily.     Ascorbic Acid (VITAMIN C) 1000 MG tablet Take 1,000 mg by mouth daily.     Aspirin Effervescent (ALKA-SELTZER ORIGINAL PO) Take by mouth daily as needed.     augmented betamethasone dipropionate (DIPROLENE-AF) 0.05 % cream Apply 1 application topically 2 (two) times daily as needed (psoriasis).      Cholecalciferol (VITAMIN D) 2000 units tablet Take  2,000 Units by mouth daily.     cyanocobalamin 2000 MCG tablet Take 2,000 mcg by mouth daily. Vitamin b12     diclofenac sodium (VOLTAREN) 1 % GEL Apply 2 g topically daily as needed (pain).      DULoxetine (CYMBALTA) 20 MG capsule Take 20 mg by mouth daily.     folic acid (FOLVITE) 1 MG tablet Take 2 tablets (2 mg total) by mouth daily. 180 tablet 2   gabapentin (NEURONTIN) 300 MG capsule Take 300 mg by mouth daily.     GLUCOSAMINE-CHONDROITIN PO Take 1 tablet by mouth 2 (two) times daily.      hydrocortisone (CORTEF) 5 MG tablet TAKE ONE TABLET BY MOUTH THREE TIMES DAILY 90 tablet 2   Multiple Vitamin (MULTIVITAMIN WITH MINERALS) TABS tablet Take 1 tablet by mouth daily.     Omega-3 Fatty Acids (FISH OIL PO) Take by mouth daily.     ondansetron (ZOFRAN-ODT) 4 MG disintegrating tablet Take 4 mg by mouth every 6 (six) hours as needed.     oxyCODONE-acetaminophen (PERCOCET/ROXICET) 5-325 MG tablet TAKE ONE TABLET BY MOUTH EVERY 8 HOURS AS NEEDED FOR SEVERE PAIN. 60 tablet 0   pyridOXINE (VITAMIN B-6) 100 MG tablet Take 100 mg by mouth daily.     Testosterone 20.25 MG/ACT (1.62%) GEL Apply 20.25 mg topically See  admin instructions. Apply 1 pump (20.25 mg) topically on each shoulder once daily (Patient not taking: Reported on 12/15/2020)     TUBERCULIN SYR 1CC/27GX1/2" (B-D TB SYRINGE 1CC/27GX1/2") 27G X 1/2" 1 ML MISC Use 1 syringe once weekly to inject methotrexate. (Patient not taking: Reported on 12/15/2020) 12 each 2   No current facility-administered medications for this visit.   Facility-Administered Medications Ordered in Other Visits  Medication Dose Route Frequency Provider Last Rate Last Admin   0.9 %  sodium chloride infusion   Intravenous Continuous [name], MD 500 mL/hr at 02/05/15 1510 New Bag at 02/05/15 1510    REVIEW OF SYSTEMS:  On review of systems, the patient reports that he is doing well overall. He denies any chest pain, shortness of breath, cough, fevers, chills, night sweats, unintended weight changes. He denies any bowel or bladder disturbances, and denies abdominal pain, nausea or vomiting. He denies any new musculoskeletal or joint aches or pains. A complete review of systems is obtained and is otherwise negative.    PHYSICAL EXAM:  Wt Readings from Last 3 Encounters:  12/15/20 237 lb 12.8 oz (107.9 kg)  12/11/20 232 lb (105.2 kg)  07/24/20 240 lb 6.4 oz (109 kg)   Temp Readings from Last 3 Encounters:  12/11/20 97.9 F (36.6 C) (Oral)  05/21/20 97.8 F (36.6 C) (Oral)  02/20/20 98.7 F (37.1 C) (Oral)   BP Readings from Last 3 Encounters:  12/15/20 109/75  12/11/20 96/73  07/24/20 (!) 146/78   Pulse Readings from Last 3 Encounters:  12/15/20 65  12/11/20 68  07/24/20 66    /10  In general this is a well appearing Caucasian male in no acute distress.  He is alert and oriented x4 and appropriate throughout the examination. HEENT reveals that the patient is normocephalic, atraumatic. EOMs are intact. PERRLA. Skin is intact without any evidence of gross lesions. Cardiopulmonary assessment is negative for acute distress and he exhibits normal effort. The  abdomen is soft, non tender, non distended. Lower extremities are negative for pretibial pitting edema, deep calf tenderness, cyanosis or clubbing.   KPS = 100  100 - Normal;  no complaints; no evidence of disease. 90   - Able to carry on normal activity; minor signs or symptoms of disease. 80   - Normal activity with effort; some signs or symptoms of disease. 44   - Cares for self; unable to carry on normal activity or to do active work. 60   - Requires occasional assistance, but is able to care for most of his personal needs. 50   - Requires considerable assistance and frequent medical care. 40   - Disabled; requires special care and assistance. 35   - Severely disabled; hospital admission is indicated although death not imminent. 66   - Very sick; hospital admission necessary; active supportive treatment necessary. 10   - Moribund; fatal processes progressing rapidly. 0     - Dead  Karnofsky DA, Abelmann [place], Craver LS and Burchenal [facility] [phone]) The use of the nitrogen mustards in the palliative treatment of carcinoma: with particular reference to bronchogenic carcinoma Cancer 1 634-56  LABORATORY DATA:  Lab Results  Component Value Date   WBC 3.9 (L) 12/11/2020   HGB 13.9 12/11/2020   HCT 39.8 12/11/2020   MCV 95.7 12/11/2020   PLT 236 12/11/2020   Lab Results  Component Value Date   NA 137 12/11/2020   K 4.0 12/11/2020   CL 104 12/11/2020   CO2 23 12/11/2020   Lab Results  Component Value Date   ALT 25 12/11/2020   AST 21 12/11/2020   ALKPHOS 66 12/11/2020   BILITOT 0.9 12/11/2020     RADIOGRAPHY: NM PET Image Restage (PS) Whole Body  Result Date: 12/10/2020 CLINICAL DATA:  Subsequent treatment strategy for melanoma of the great toe on the RIGHT in a 55 year old male. EXAM: NUCLEAR MEDICINE PET WHOLE BODY TECHNIQUE: 11.7 mCi F-18 FDG was injected intravenously. Full-ring PET imaging was performed from the head to foot after the radiotracer. CT data was obtained and used  for attenuation correction and anatomic localization. Fasting blood glucose: 90 mg/dl COMPARISON:  Multiple prior studies most recent May 18, 2020. FINDINGS: Mediastinal blood pool activity: SUV max 2.48 HEAD/NECK: No hypermetabolic activity in the scalp. No hypermetabolic cervical lymph nodes. Incidental CT findings: none CHEST: No hypermetabolic mediastinal or hilar nodes. No suspicious pulmonary nodules on the CT scan. Incidental CT findings: Scattered atheromatous plaque in the thoracic aorta. Normal heart size without substantial pericardial effusion. Normal caliber of central pulmonary vasculature. Limited assessment of cardiovascular structures given lack of intravenous contrast. No suspicious pulmonary nodule. Basilar atelectasis. Airways are patent. No effusion. ABDOMEN/PELVIS: RIGHT common iliac lymph node (image 205/4) 16 mm with a maximum SUV of 11.4. Signs of prior RIGHT groin dissection without focal increased metabolic activity. LEFT gluteal nodule with a maximum SUV of 4.5 increased from 2.6 on the previous study. No solid organ uptake to suggest solid organ involvement. Incidental CT findings: Hepatic cysts are similar to previous imaging. Post cholecystectomy. Pancreatic atrophy as before. Spleen, adrenal glands and kidneys without acute findings. Cortical scarring of the bilateral kidneys. Smooth contour the urinary bladder under distended. No acute gastrointestinal findings with normal appendix. Prostate unremarkable by CT. SKELETON: No focal hypermetabolic activity to suggest skeletal metastasis. Incidental CT findings: none EXTREMITIES: No abnormal hypermetabolic activity in the lower extremities. Incidental CT findings: Chronic AVN/bone infarct about the knees as before. IMPRESSION: Signs of disease recurrence in the RIGHT common iliac region with enlarged lymph node in this area displaying hypermetabolic features. LEFT gluteal nodule with increased metabolic activity also suspicious but  not changed  in size since previous imaging. Maximum SUV of 4.5 on today's study as compared to 2.6 on the previous exam. This could potentially represent a small injection granuloma. Scattered nodules in the soft tissues of the RIGHT gluteal region without increased metabolic activity compatible with small injection granulomata. Other chronic findings as outlined above including aortic atherosclerosis and chronic bone infarcts. Aortic Atherosclerosis (ICD10-I70.0). Electronically Signed   By: Zetta Bills M.D.   On: 12/10/2020 09:02      IMPRESSION/PLAN: 1. 55 y.o. with recurrent metastatic melanoma involving a solitary right common iliac lymph node and a left gluteal nodule.    The gluteal nodule was read on the PET as a possible injection granuloma, but, in questioning the patient in detail and reviewing all of his medication administrations, there is not a left gluteal injection in the past several years.  Given the increase in PET activity in 6 months and the low risk of any complications by treating the nodule with SBRT, I think SBRT to the nodule is reasonable.  Today, we talked to the patient and his wife, Hoyle Sauer, about the findings and workup thus far. We discussed the natural history of recurrent metastatic melanoma and general treatment, highlighting the role of stereotactic body radiotherapy (SBRT) in the management of oligometastatic disease in a solitary right inguinal node and a left gluteal nodule. We discussed the available radiation techniques, and focused on the details and logistics of delivery.  The recommendation is for a 5 fraction course of SBRT targeting the recurrent disease in the solitary right common iliac node and the left gluteal nodule.  We reviewed the anticipated acute and late sequelae associated with radiation in this setting. The patient and his wife were encouraged to ask questions that were answered to their stated satisfaction.  At the conclusion of our  conversation, the patient elects to proceed with the recommended 5 fraction course of SBRT targeting the recurrent disease in the solitary right common iliac node and the left gluteal nodule.  He has freely signed written consent to proceed today in the office and a copy of this document will be placed in his medical record.  He will undergo CT simulation/treatment planning following our visit today, in anticipation of beginning his treatments in the near future.  He and his wife are comfortable and in agreement with the stated plan.  Therefore, we will share our discussion with Dr. Marin Olp and proceed with treatment planning accordingly.  We personally spent 75 minutes in this encounter including chart review, reviewing radiological studies, meeting face-to-face with the patient, entering orders and completing documentation.    Nicholos Johns, PA-C    Tyler Pita, MD  [facility] Direct Dial: [phone]  Fax: [phone] [email or web address]  Skype  LinkedIn

## 2020-12-17 DIAGNOSIS — C775 Secondary and unspecified malignant neoplasm of intrapelvic lymph nodes: Secondary | ICD-10-CM | POA: Diagnosis not present

## 2020-12-17 DIAGNOSIS — C4371 Malignant melanoma of right lower limb, including hip: Secondary | ICD-10-CM | POA: Diagnosis not present

## 2020-12-18 DIAGNOSIS — C775 Secondary and unspecified malignant neoplasm of intrapelvic lymph nodes: Secondary | ICD-10-CM | POA: Diagnosis not present

## 2020-12-18 DIAGNOSIS — C4371 Malignant melanoma of right lower limb, including hip: Secondary | ICD-10-CM | POA: Diagnosis not present

## 2020-12-18 DIAGNOSIS — C779 Secondary and unspecified malignant neoplasm of lymph node, unspecified: Secondary | ICD-10-CM | POA: Insufficient documentation

## 2020-12-29 ENCOUNTER — Other Ambulatory Visit: Payer: Self-pay

## 2020-12-29 ENCOUNTER — Ambulatory Visit
Admission: RE | Admit: 2020-12-29 | Discharge: 2020-12-29 | Disposition: A | Discharge status: Home / Self Care | Payer: PPO | Source: Ambulatory Visit | Attending: Radiation Oncology | Admitting: Radiation Oncology

## 2020-12-29 DIAGNOSIS — C779 Secondary and unspecified malignant neoplasm of lymph node, unspecified: Secondary | ICD-10-CM

## 2020-12-30 ENCOUNTER — Ambulatory Visit: Payer: PPO | Admitting: Radiation Oncology

## 2020-12-31 ENCOUNTER — Ambulatory Visit
Admission: RE | Admit: 2020-12-31 | Discharge: 2020-12-31 | Disposition: A | Discharge status: Home / Self Care | Payer: PPO | Source: Ambulatory Visit | Attending: Radiation Oncology | Admitting: Radiation Oncology

## 2020-12-31 ENCOUNTER — Other Ambulatory Visit: Payer: Self-pay

## 2020-12-31 DIAGNOSIS — C779 Secondary and unspecified malignant neoplasm of lymph node, unspecified: Secondary | ICD-10-CM | POA: Diagnosis not present

## 2021-01-01 ENCOUNTER — Ambulatory Visit: Payer: PPO | Admitting: Radiation Oncology

## 2021-01-04 ENCOUNTER — Ambulatory Visit
Admission: RE | Admit: 2021-01-04 | Discharge: 2021-01-04 | Disposition: A | Discharge status: Home / Self Care | Payer: PPO | Source: Ambulatory Visit | Attending: Radiation Oncology | Admitting: Radiation Oncology

## 2021-01-04 DIAGNOSIS — C779 Secondary and unspecified malignant neoplasm of lymph node, unspecified: Secondary | ICD-10-CM

## 2021-01-06 ENCOUNTER — Ambulatory Visit
Admission: RE | Admit: 2021-01-06 | Discharge: 2021-01-06 | Disposition: A | Discharge status: Home / Self Care | Payer: PPO | Source: Ambulatory Visit | Attending: Radiation Oncology | Admitting: Radiation Oncology

## 2021-01-06 ENCOUNTER — Other Ambulatory Visit: Payer: Self-pay

## 2021-01-06 DIAGNOSIS — C779 Secondary and unspecified malignant neoplasm of lymph node, unspecified: Secondary | ICD-10-CM | POA: Diagnosis not present

## 2021-01-08 ENCOUNTER — Other Ambulatory Visit: Payer: Self-pay

## 2021-01-08 ENCOUNTER — Ambulatory Visit
Admission: RE | Admit: 2021-01-08 | Discharge: 2021-01-08 | Disposition: A | Discharge status: Home / Self Care | Payer: PPO | Source: Ambulatory Visit | Attending: Radiation Oncology | Admitting: Radiation Oncology

## 2021-01-08 ENCOUNTER — Encounter: Payer: Self-pay | Admitting: Urology

## 2021-01-08 DIAGNOSIS — C775 Secondary and unspecified malignant neoplasm of intrapelvic lymph nodes: Secondary | ICD-10-CM | POA: Diagnosis not present

## 2021-01-08 DIAGNOSIS — C779 Secondary and unspecified malignant neoplasm of lymph node, unspecified: Secondary | ICD-10-CM

## 2021-01-08 DIAGNOSIS — C4371 Malignant melanoma of right lower limb, including hip: Secondary | ICD-10-CM | POA: Diagnosis not present

## 2021-01-12 ENCOUNTER — Other Ambulatory Visit: Payer: Self-pay | Admitting: *Deleted

## 2021-01-12 MED ORDER — HYDROCORTISONE 5 MG PO TABS: 5.0000 mg | ORAL_TABLET | Freq: Three times a day (TID) | ORAL | 3 refills | Status: DC

## 2021-01-12 NOTE — Telephone Encounter (Signed)
Call received from pt.'s wife requesting a refill of hydrocortisone.  Refill sent per order of Dr. Marin Olp.

## 2021-01-22 ENCOUNTER — Encounter: Payer: Self-pay | Admitting: Hematology & Oncology

## 2021-01-22 ENCOUNTER — Other Ambulatory Visit: Payer: Self-pay

## 2021-01-22 ENCOUNTER — Inpatient Hospital Stay: Payer: PPO | Attending: Hematology & Oncology

## 2021-01-22 ENCOUNTER — Inpatient Hospital Stay: Payer: PPO | Admitting: Hematology & Oncology

## 2021-01-22 VITALS — BP 119/75 | HR 78 | Temp 98.1°F | Resp 18 | Wt 241.0 lb

## 2021-01-22 DIAGNOSIS — C7A8 Other malignant neuroendocrine tumors: Secondary | ICD-10-CM

## 2021-01-22 DIAGNOSIS — C4371 Malignant melanoma of right lower limb, including hip: Secondary | ICD-10-CM

## 2021-01-22 DIAGNOSIS — Z79899 Other long term (current) drug therapy: Secondary | ICD-10-CM | POA: Diagnosis not present

## 2021-01-22 DIAGNOSIS — D509 Iron deficiency anemia, unspecified: Secondary | ICD-10-CM | POA: Diagnosis not present

## 2021-01-22 DIAGNOSIS — C779 Secondary and unspecified malignant neoplasm of lymph node, unspecified: Secondary | ICD-10-CM | POA: Diagnosis not present

## 2021-01-22 LAB — CBC WITH DIFFERENTIAL (CANCER CENTER ONLY)
Abs Immature Granulocytes: 0.01 10*3/uL (ref 0.00–0.07)
Basophils Absolute: 0 10*3/uL (ref 0.0–0.1)
Basophils Relative: 0 %
Eosinophils Absolute: 0.2 10*3/uL (ref 0.0–0.5)
Eosinophils Relative: 5 %
HCT: 38.4 % — ABNORMAL LOW (ref 39.0–52.0)
Hemoglobin: 13.5 g/dL (ref 13.0–17.0)
Immature Granulocytes: 0 %
Lymphocytes Relative: 21 %
Lymphs Abs: 1 10*3/uL (ref 0.7–4.0)
MCH: 33.7 pg (ref 26.0–34.0)
MCHC: 35.2 g/dL (ref 30.0–36.0)
MCV: 95.8 fL (ref 80.0–100.0)
Monocytes Absolute: 0.3 10*3/uL (ref 0.1–1.0)
Monocytes Relative: 7 %
Neutro Abs: 3 10*3/uL (ref 1.7–7.7)
Neutrophils Relative %: 67 %
Platelet Count: 170 10*3/uL (ref 150–400)
RBC: 4.01 MIL/uL — ABNORMAL LOW (ref 4.22–5.81)
RDW: 12.3 % (ref 11.5–15.5)
WBC Count: 4.5 10*3/uL (ref 4.0–10.5)
nRBC: 0 % (ref 0.0–0.2)

## 2021-01-22 LAB — CMP (CANCER CENTER ONLY)
ALT: 33 U/L (ref 0–44)
AST: 24 U/L (ref 15–41)
Albumin: 4.5 g/dL (ref 3.5–5.0)
Alkaline Phosphatase: 91 U/L (ref 38–126)
Anion gap: 8 (ref 5–15)
BUN: 21 mg/dL — ABNORMAL HIGH (ref 6–20)
CO2: 28 mmol/L (ref 22–32)
Calcium: 9.7 mg/dL (ref 8.9–10.3)
Chloride: 102 mmol/L (ref 98–111)
Creatinine: 1.12 mg/dL (ref 0.61–1.24)
GFR, Estimated: >60 mL/min (ref >60)
Glucose, Bld: 149 mg/dL — ABNORMAL HIGH (ref 70–99)
Potassium: 4 mmol/L (ref 3.5–5.1)
Sodium: 138 mmol/L (ref 135–145)
Total Bilirubin: 0.5 mg/dL (ref 0.3–1.2)
Total Protein: 7.4 g/dL (ref 6.5–8.1)

## 2021-01-22 LAB — LACTATE DEHYDROGENASE: LDH: 144 U/L (ref 98–192)

## 2021-01-22 NOTE — Progress Notes (Signed)
Hematology and Oncology Follow Up Visit  Souleymane Saiki [id number] 1965-11-04 55 y.o. 01/22/2021   Principle Diagnosis:  Stage IIIB (T3bN1aM0) subungual melanoma of the right hallux -- nodal recurrence in the RIGHT inguinal nodes -- BRAF wt RIGHT Common Iliac node recurrence Neuroendocrine carcinoma of the pancreas Iron def anemia  Current Therapy:   Yervoy-status post 3 cycles - discontinued in September 2016 secondary to toxicity  XRT to the RIGHT inguinal basin Nivolumab 400 mg q month -- s/p cycle #8 -start in 12/2018 - d/c on 08/2019 due to toxicity Somatuline 120 mg IM q month -- started on 05/22/2019 - d/c on 08/2019 IV Iron -- Iron Dextran given on 09/18/2019   Interim History:  Mr. Laskaris is here today for follow-up.  He is looking quite good.  He finished up the radiation therapy couple weeks ago.  He did well with this.  He has had no problems with respect to the flareup of his arthritis.  His knees feel pretty good.  He has had no change in bowel or bladder habits.  He has had no issues with cough or shortness of breath.  He has had no headache.  There has been no bleeding.  He has had no rashes.  He has had no palpable nodules.  His appetite is doing well.  He and his wife did have vacation.  Hopefully, they will be able to go on another vacation soon.  Currently, I would have to say his performance status is ECOG 1.      Allergies: No Known Allergies  Past Medical History, Surgical history, Social history, and Family History were reviewed and updated.  Review of Systems: Review of Systems  Constitutional:  Positive for malaise/fatigue.  HENT: Negative.    Eyes: Negative.   Respiratory:  Positive for shortness of breath.   Cardiovascular: Negative.   Gastrointestinal:  Positive for constipation and nausea.  Genitourinary: Negative.   Musculoskeletal:  Positive for joint pain and myalgias.  Skin: Negative.   Neurological:  Positive for tingling and focal  weakness.  Endo/Heme/Allergies: Negative.   Psychiatric/Behavioral:  Positive for memory loss.     Physical Exam:  vitals were not taken for this visit.   Wt Readings from Last 3 Encounters:  12/16/20 236 lb 9.6 oz (107.3 kg)  12/15/20 237 lb 12.8 oz (107.9 kg)  12/11/20 232 lb (105.2 kg)    Physical Exam Vitals reviewed.  HENT:     Head: Normocephalic and atraumatic.  Eyes:     Pupils: Pupils are equal, round, and reactive to light.  Cardiovascular:     Rate and Rhythm: Normal rate and regular rhythm.     Heart sounds: Normal heart sounds.  Pulmonary:     Effort: Pulmonary effort is normal.     Breath sounds: Normal breath sounds.  Abdominal:     General: Bowel sounds are normal.     Palpations: Abdomen is soft.     Comments: Abdominal exam shows a soft abdomen.  Bowel sounds are present.  There is no palpable abdominal mass.  There is no palpable liver or spleen tip.  He has a healing right inguinal lymphadenectomy scar.  I cannot palpate any obvious adenopathy.  The left inguinal area is unremarkable.    Musculoskeletal:        General: No tenderness or deformity. Normal range of motion.     Cervical back: Normal range of motion.     Comments: The big toe on the right foot is amputated.  There is no lymphedema in the right leg.  He has good pulses in his distal extremities.  Left leg is unremarkable.  Lymphadenopathy:     Cervical: No cervical adenopathy.  Skin:    General: Skin is warm and dry.     Findings: No erythema or rash.  Neurological:     Mental Status: He is alert and oriented to person, place, and time.  Psychiatric:        Behavior: Behavior normal.        Thought Content: Thought content normal.        Judgment: Judgment normal.     Lab Results  Component Value Date   WBC 3.9 (L) 12/11/2020   HGB 13.9 12/11/2020   HCT 39.8 12/11/2020   MCV 95.7 12/11/2020   PLT 236 12/11/2020   Lab Results  Component Value Date   FERRITIN 1,845 (H) 12/11/2020    IRON 149 12/11/2020   TIBC 249 12/11/2020   UIBC 99 (L) 12/11/2020   IRONPCTSAT 60 (H) 12/11/2020   Lab Results  Component Value Date   RETICCTPCT 1.6 11/27/2019   RBC 4.16 (L) 12/11/2020   No results found for: Nils Pyle [facility] Lab Results  Component Value Date   IGGSERUM 1,046 03/25/2020   IGMSERUM 64 03/25/2020   Lab Results  Component Value Date   ALBUMINELP 4.3 03/25/2020   A1GS 0.3 03/25/2020   A2GS 0.7 03/25/2020   BETS 0.4 03/25/2020   BETA2SER 0.3 03/25/2020   GAMS 0.9 03/25/2020   SPEI  03/25/2020     Comment:     Normal Serum Protein Electrophoresis Pattern. No abnormal protein bands (M-protein) detected.      Chemistry      Component Value Date/Time   NA 137 12/11/2020 0755   NA 138 06/04/2020 1003   NA 145 06/06/2017 1316   NA 141 10/16/2015 1334   K 4.0 12/11/2020 0755   K 4.1 06/06/2017 1316   K 3.9 10/16/2015 1334   CL 104 12/11/2020 0755   CL 108 06/06/2017 1316   CO2 23 12/11/2020 0755   CO2 26 06/06/2017 1316   CO2 21 (L) 10/16/2015 1334   BUN 25 (H) 12/11/2020 0755   BUN 17 06/04/2020 1003   BUN 19 06/06/2017 1316   BUN 20.0 10/16/2015 1334   CREATININE 1.07 12/11/2020 0755   CREATININE 1.0 06/06/2017 1316   CREATININE 1.0 10/16/2015 1334      Component Value Date/Time   CALCIUM 9.6 12/11/2020 0755   CALCIUM 9.4 06/06/2017 1316   CALCIUM 9.7 10/16/2015 1334   ALKPHOS 66 12/11/2020 0755   ALKPHOS 78 06/06/2017 1316   ALKPHOS 62 10/16/2015 1334   AST 21 12/11/2020 0755   AST 16 10/16/2015 1334   ALT 25 12/11/2020 0755   ALT 27 06/06/2017 1316   ALT 11 10/16/2015 1334   BILITOT 0.9 12/11/2020 0755   BILITOT 0.49 10/16/2015 1334      Impression and Plan: Mr. Brandis is a very pleasant 55 yo caucasian gentleman with history of stage IIIB melanoma of the right hallux that was subungual with one microscopic positive inguinal lymph node. He completed 3 cycles of Yervoy but stopped due to side effects.   He then  had a recurrence in the right inguinal area.  He had this resected.  He then underwent radiation therapy.  This was completed I think in March 2021.  He now has had another recurrence.  As such, he had radiosurgery for this.  Hopefully,  we will see that he is back in remission.  We probably would not do another PET scan on him for another month or so.  I would like for him to wait about 6 weeks was so until we do another scan on him.  I think when the problems is that is melanoma has been BRAF wild-type.  He is not tolerant of immunotherapy although what he had before was [facility] and we might be able to consider immunotherapy (i.e. immune checkpoint inhibitor) and hopefully he would do well.  I am just glad that his quality of life is doing better.  I know he has the arthritic issues.  This seems to be holding steady right now.  I will plan to do a PET scan in late September.  I will then see him back afterwards.     [name], MD 8/5/20229:40 AM

## 2021-01-25 ENCOUNTER — Encounter: Payer: Self-pay | Admitting: Hematology & Oncology

## 2021-01-31 ENCOUNTER — Other Ambulatory Visit: Payer: Self-pay | Admitting: Rheumatology

## 2021-02-01 NOTE — Telephone Encounter (Signed)
Next Visit: 03/18/2021  Last Visit: 03/18/2021  Last Fill: 04/08/2020  Dx: Chronic inflammatory arthritis   Current Dose per office note on 3/38/2505: folic acid 2 mg po daily  Okay to refill per Dr. Estanislado Pandy

## 2021-02-19 DIAGNOSIS — R058 Other specified cough: Secondary | ICD-10-CM | POA: Diagnosis not present

## 2021-02-19 DIAGNOSIS — Z20822 Contact with and (suspected) exposure to covid-19: Secondary | ICD-10-CM | POA: Diagnosis not present

## 2021-02-20 DIAGNOSIS — R059 Cough, unspecified: Secondary | ICD-10-CM | POA: Diagnosis not present

## 2021-02-20 DIAGNOSIS — R509 Fever, unspecified: Secondary | ICD-10-CM | POA: Diagnosis not present

## 2021-02-20 DIAGNOSIS — Z20822 Contact with and (suspected) exposure to covid-19: Secondary | ICD-10-CM | POA: Diagnosis not present

## 2021-02-20 DIAGNOSIS — J209 Acute bronchitis, unspecified: Secondary | ICD-10-CM | POA: Diagnosis not present

## 2021-02-23 ENCOUNTER — Other Ambulatory Visit: Payer: Self-pay

## 2021-02-23 DIAGNOSIS — J329 Chronic sinusitis, unspecified: Secondary | ICD-10-CM | POA: Diagnosis not present

## 2021-02-23 DIAGNOSIS — J40 Bronchitis, not specified as acute or chronic: Secondary | ICD-10-CM | POA: Diagnosis not present

## 2021-02-23 MED ORDER — HYDROCORTISONE 5 MG PO TABS: 5.0000 mg | ORAL_TABLET | Freq: Three times a day (TID) | ORAL | 3 refills | Status: DC

## 2021-02-24 ENCOUNTER — Encounter: Payer: Self-pay | Admitting: Urology

## 2021-02-24 NOTE — Progress Notes (Signed)
Patient reports moderate fatigue, mild constipation that's resolving, and a cough, due to recent bronchitis. No other symptoms to report at this time.  Meaningful use complete.  Patient notified of 8:30am telephone visit on 02/25/21 and expressed understanding.

## 2021-02-25 ENCOUNTER — Ambulatory Visit
Admission: RE | Admit: 2021-02-25 | Discharge: 2021-02-25 | Disposition: A | Discharge status: Home / Self Care | Payer: PPO | Source: Ambulatory Visit | Attending: Radiation Oncology | Admitting: Radiation Oncology

## 2021-02-25 DIAGNOSIS — C779 Secondary and unspecified malignant neoplasm of lymph node, unspecified: Secondary | ICD-10-CM | POA: Insufficient documentation

## 2021-02-25 NOTE — Progress Notes (Signed)
  Radiation Oncology         [phone]) [phone] ________________________________  Name: James Byrd MRN: [id number]  Date: 01/08/2021  DOB: May 03, 1966  End of Treatment Note  Diagnosis:   55 y.o. with recurrent metastatic melanoma involving a solitary right common iliac lymph node and a left gluteal nodule.     Indication for treatment:  Curative, Definitive SBRT       Radiation treatment dates:   12/29/20 - 01/08/21  Site/dose:   The right common iliac lymph node and a left gluteal nodule were treated to 50 Gy in 5 fractions of 10 Gy  Beams/energy:   The patient was treated using stereotactic body radiotherapy according to a 3D conformal radiotherapy plan.  Volumetric arc fields were employed to deliver 6 MV X-rays.  Image guidance was performed with per fraction cone beam CT prior to treatment under personal MD supervision.  Immobilization was achieved using BodyFix Pillow.  Narrative: The patient tolerated radiation treatment well with no acute ill side effects.  Plan: The patient has completed radiation treatment. The patient will return to radiation oncology clinic for routine followup in one month. I advised them to call or return sooner if they have any questions or concerns related to their recovery or treatment. ________________________________  Sheral Apley. Tammi Klippel, M.D.

## 2021-02-25 NOTE — Progress Notes (Signed)
Radiation Oncology         [phone]) [phone] ________________________________  Name: James Byrd MRN: [id number]  Date: 02/25/2021  DOB: [birth year]  Post Treatment Note  CC: Street, Sharon Mt, MD  Street, Sharon Mt, *  Diagnosis:   55 y.o. with recurrent metastatic melanoma involving a solitary right common iliac lymph node and a left gluteal nodule.   Interval Since Last Radiation:  7 weeks  12/29/20 - 01/08/21: The right common iliac lymph node and a left gluteal nodule were treated to 50 Gy in 5 fractions of 10 Gy  12/27/2018 through 01/29/2019 Site Technique Total Dose Dose per Fx Completed Fx Beam Energies  Extremity: Ext_Rt_rt inguinal         Narrative:  I spoke with the patient to conduct his routine scheduled 1 month follow up visit via telephone to spare the patient unnecessary potential exposure in the healthcare setting during the current COVID-19 pandemic.  The patient was notified in advance and gave permission to proceed with this visit format.  He tolerated radiation treatment well with no acute ill side effects.                              On review of systems, the patient states that he is doing well in general. His fatigue is improving and he is currently without complaints aside from occasional constipation that he is managing with stool softeners prn. He denies any skin irritation in the treatment fields, abdominal pain, N/V/D. He reports a healthy appetite and is maintaining his weight. Overall, he is quite pleased with his progress to date.  ALLERGIES:  has No Known Allergies.  Meds: Current Outpatient Medications  Medication Sig Dispense Refill   acetaminophen (TYLENOL) 650 MG CR tablet Take 1,300 mg by mouth daily as needed for pain.     Ascorbic Acid (VITAMIN C) 1000 MG tablet Take 1,000 mg by mouth daily.     augmented betamethasone dipropionate (DIPROLENE-AF) 0.05 % cream Apply 1 application topically 2 (two) times daily as needed (psoriasis).       Cholecalciferol (VITAMIN D) 2000 units tablet Take 2,000 Units by mouth daily.     cyanocobalamin 2000 MCG tablet Take 2,000 mcg by mouth daily. Vitamin b12     diclofenac sodium (VOLTAREN) 1 % GEL Apply 2 g topically daily as needed (pain).      DULoxetine (CYMBALTA) 20 MG capsule Take 20 mg by mouth daily.     folic acid (FOLVITE) 1 MG tablet TAKE TWO TABLETS BY MOUTH ONCE DAILY 180 tablet 2   gabapentin (NEURONTIN) 300 MG capsule Take 300 mg by mouth daily.     GLUCOSAMINE-CHONDROITIN PO Take 1 tablet by mouth 2 (two) times daily.      hydrocortisone (CORTEF) 5 MG tablet Take 1 tablet (5 mg total) by mouth 3 (three) times daily. 90 tablet 3   Multiple Vitamin (MULTIVITAMIN WITH MINERALS) TABS tablet Take 1 tablet by mouth daily.     Omega-3 Fatty Acids (FISH OIL PO) Take by mouth daily.     ondansetron (ZOFRAN-ODT) 4 MG disintegrating tablet Take 4 mg by mouth every 6 (six) hours as needed.     oxyCODONE-acetaminophen (PERCOCET/ROXICET) 5-325 MG tablet TAKE ONE TABLET BY MOUTH EVERY 8 HOURS AS NEEDED FOR SEVERE PAIN. 60 tablet 0   pyridOXINE (VITAMIN B-6) 100 MG tablet Take 100 mg by mouth daily.     Methotrexate Sodium (METHOTREXATE, PF,) 50 MG/2ML injection  INJECT 0.10ml SUBCUTANEOUSLY ONCE A WEEK (Patient not taking: No sig reported) 10 mL 0   Testosterone 20.25 MG/ACT (1.62%) GEL Apply 20.25 mg topically See admin instructions. Apply 1 pump (20.25 mg) topically on each shoulder once daily (Patient not taking: No sig reported)     TUBERCULIN SYR 1CC/27GX1/2" (B-D TB SYRINGE 1CC/27GX1/2") 27G X 1/2" 1 ML MISC Use 1 syringe once weekly to inject methotrexate. (Patient not taking: No sig reported) 12 each 2   No current facility-administered medications for this encounter.   Facility-Administered Medications Ordered in Other Encounters  Medication Dose Route Frequency Provider Last Rate Last Admin   0.9 %  sodium chloride infusion   Intravenous Continuous [name], MD 500 mL/hr at  02/05/15 1510 New Bag at 02/05/15 1510    Physical Findings:  vitals were not taken for this visit.  Pain Assessment Pain Score: 0-No pain/10 Unable to assess due to telephone follow up visit format.   Lab Findings: Lab Results  Component Value Date   WBC 4.5 01/22/2021   HGB 13.5 01/22/2021   HCT 38.4 (L) 01/22/2021   MCV 95.8 01/22/2021   PLT 170 01/22/2021     Radiographic Findings: No results found.  Impression/Plan: 1. 55 y.o. with recurrent metastatic melanoma involving a solitary right common iliac lymph node and a left gluteal nodule.  He appears to have recovered well from the effects of his recent radiotherapy and is currently without complaints. We discussed that while we are happy to continue to participate in his care if clinically indicated, at this point, we will plan to see him back on an as needed basis. He will continue in routine follow up under the care and direction of Dr. Marin Olp for continued management of his systemic disease. He knows that he is welcome to call at any time with questions or concerns related to his prior radiotherapy.    Nicholos Johns, PA-C

## 2021-03-04 NOTE — Progress Notes (Deleted)
Office Visit Note  Patient: James Byrd             Date of Birth: [birth year]           MRN: [id number]             PCP: Emmaline Kluver, MD Referring: Street, Sharon Mt, * Visit Date: 03/18/2021 Occupation: @GUAROCC @  Subjective:  No chief complaint on file.   History of Present Illness: James Byrd is a 55 y.o. male ***   Activities of Daily Living:  Patient reports morning stiffness for *** {minute/hour:19697}.   Patient {ACTIONS;DENIES/REPORTS:21021675::"Denies"} nocturnal pain.  Difficulty dressing/grooming: {ACTIONS;DENIES/REPORTS:21021675::"Denies"} Difficulty climbing stairs: {ACTIONS;DENIES/REPORTS:21021675::"Denies"} Difficulty getting out of chair: {ACTIONS;DENIES/REPORTS:21021675::"Denies"} Difficulty using hands for taps, buttons, cutlery, and/or writing: {ACTIONS;DENIES/REPORTS:21021675::"Denies"}  No Rheumatology ROS completed.   PMFS History:  Patient Active Problem List   Diagnosis Date Noted   Metastatic melanoma to lymph node ([place]) 12/16/2020   Pain in both feet 10/17/2019   Bilateral pulmonary embolism ([place]) 08/26/2019   Neuroendocrine carcinoma of pancreas ([place]) 05/22/2019   Severe sepsis with septic shock ([place]) 01/15/2019   Acute respiratory failure with hypoxia ([place]) 01/15/2019   AKI (acute kidney injury) ([place]) 01/15/2019   Septic shock ([place]) 01/13/2019   Goals of care, counseling/discussion 12/17/2018   Adrenal insufficiency ([facility])    Refractory nausea and vomiting    Paranasal sinus disease    Acute renal failure syndrome ([place])    HCAP (healthcare-associated pneumonia) 03/06/2015   Drug-induced pneumonitis - VERVOY    Thrombocytopenia ([place]) 02/09/2015   HLD (hyperlipidemia)    Chronic diastolic congestive heart failure ([place])    Malignant melanoma of right great toe Stage IIIB ([id number]) 11/14/2014   Obstructive sleep apnea 07/19/2012   Paroxysmal supraventricular tachycardia ([place]) 05/24/2012   Chest pressure 05/24/2012    Palpitations 05/24/2012   Anxiety 05/24/2012    Past Medical History:  Diagnosis Date   Adrenal insufficiency ([place])    Anxiety 35/07/4816   Complication of anesthesia    SLO TO AWAKEN MANY 8 TO 9 YRS AGO, NO ISSUES SINCE   Goals of care, counseling/discussion 12/17/2018   Gout    History of kidney stones    HLD (hyperlipidemia)    Malignant melanoma of right great toe ([place]) 11/14/2014   Neuroendocrine carcinoma of pancreas ([place]) 05/22/2019   Paroxysmal supraventricular tachycardia ([place]) 05/24/2012   Described in the treadmill report; details are pending    PE (pulmonary thromboembolism) ([place]) 08/2019   Rheumatoid arthritis ([place])    Sleep apnea    MILD NO CPAP NEEDED    Family History  Adopted: Yes  Problem Relation Age of Onset   Stroke Mother    COPD Father    Diabetes Father    Dementia Father    Diabetes Sister    Past Surgical History:  Procedure Laterality Date   CHOLECYSTECTOMY     ESOPHAGOGASTRODUODENOSCOPY (EGD) WITH PROPOFOL N/A 05/13/2019   Procedure: ESOPHAGOGASTRODUODENOSCOPY (EGD) WITH PROPOFOL;  Surgeon: Arta Silence, MD;  Location: [facility];  Service: Endoscopy;  Laterality: N/A;   EXTRACORPOREAL SHOCK WAVE LITHOTRIPSY  2016   FINE NEEDLE ASPIRATION N/A 05/13/2019   Procedure: FINE NEEDLE ASPIRATION (FNA) LINEAR;  Surgeon: Arta Silence, MD;  Location: [facility];  Service: Endoscopy;  Laterality: N/A;   LYMPH NODE BIOPSY Right 11/06/2018   Procedure: RIGHT INGUINAL LYMPH NODE BIOPSY AND POSSIBLE RIGHT GROIN NODE DISECTION;  Surgeon: Alphonsa Overall, MD;  Location: [facility];  Service: General;  Laterality: Right;   r toe partial  ambutation     ROTATOR CUFF REPAIR Right 2010   3 yrs ago   UPPER ESOPHAGEAL ENDOSCOPIC ULTRASOUND (EUS) N/A 05/13/2019   Procedure: UPPER ESOPHAGEAL ENDOSCOPIC ULTRASOUND (EUS);  Surgeon: Arta Silence, MD;  Location: [facility];  Service: Endoscopy;  Laterality: N/A;   Social History   Social History Narrative   Lives  with wife in a one story home.  Has 2 children.  On disability.  Education: high school.+   Immunization History  Administered Date(s) Administered   Influenza,inj,Quad PF,6+ Mos 03/19/2019   Influenza-Unspecified 04/03/2014     Objective: Vital Signs: There were no vitals taken for this visit.   Physical Exam   Musculoskeletal Exam: ***  CDAI Exam: CDAI Score: -- Patient Global: --; Provider Global: -- Swollen: --; Tender: -- Joint Exam 03/18/2021   No joint exam has been documented for this visit   There is currently no information documented on the homunculus. Go to the Rheumatology activity and complete the homunculus joint exam.  Investigation: No additional findings.  Imaging: No results found.  Recent Labs: Lab Results  Component Value Date   WBC 4.5 01/22/2021   HGB 13.5 01/22/2021   PLT 170 01/22/2021   NA 138 01/22/2021   K 4.0 01/22/2021   CL 102 01/22/2021   CO2 28 01/22/2021   GLUCOSE 149 (H) 01/22/2021   BUN 21 (H) 01/22/2021   CREATININE 1.12 01/22/2021   BILITOT 0.5 01/22/2021   ALKPHOS 91 01/22/2021   AST 24 01/22/2021   ALT 33 01/22/2021   PROT 7.4 01/22/2021   ALBUMIN 4.5 01/22/2021   CALCIUM 9.7 01/22/2021   GFRAA 105 06/04/2020   QFTBGOLDPLUS NEGATIVE 03/25/2020    Speciality Comments: No specialty comments available.  Procedures:  No procedures performed Allergies: Patient has no known allergies.   Assessment / Plan:     Visit Diagnoses: No diagnosis found.  Orders: No orders of the defined types were placed in this encounter.  No orders of the defined types were placed in this encounter.   Face-to-face time spent with patient was *** minutes. Greater than 50% of time was spent in counseling and coordination of care.  Follow-Up Instructions: No follow-ups on file.   Earnestine Mealing, CMA  Note - This record has been created using Editor, commissioning.  Chart creation errors have been sought, but may not always  have been  located. Such creation errors do not reflect on  the standard of medical care.

## 2021-03-11 ENCOUNTER — Other Ambulatory Visit: Payer: Self-pay

## 2021-03-11 ENCOUNTER — Ambulatory Visit (HOSPITAL_COMMUNITY)
Admission: RE | Admit: 2021-03-11 | Discharge: 2021-03-11 | Disposition: A | Discharge status: Home / Self Care | Payer: PPO | Source: Ambulatory Visit | Attending: Hematology & Oncology | Admitting: Hematology & Oncology

## 2021-03-11 DIAGNOSIS — Z79899 Other long term (current) drug therapy: Secondary | ICD-10-CM | POA: Diagnosis not present

## 2021-03-11 DIAGNOSIS — I700 Atherosclerosis of aorta: Secondary | ICD-10-CM | POA: Diagnosis not present

## 2021-03-11 DIAGNOSIS — C7A8 Other malignant neuroendocrine tumors: Secondary | ICD-10-CM | POA: Insufficient documentation

## 2021-03-11 DIAGNOSIS — C439 Malignant melanoma of skin, unspecified: Secondary | ICD-10-CM | POA: Diagnosis not present

## 2021-03-11 LAB — GLUCOSE, CAPILLARY: Glucose-Capillary: 94 mg/dL (ref 70–99)

## 2021-03-11 MED ORDER — FLUDEOXYGLUCOSE F - 18 (FDG) INJECTION
12.5000 | Freq: Once | INTRAVENOUS | Status: AC
  Administered 2021-03-11: 12.65 via INTRAVENOUS

## 2021-03-12 DIAGNOSIS — R051 Acute cough: Secondary | ICD-10-CM | POA: Diagnosis not present

## 2021-03-12 DIAGNOSIS — J209 Acute bronchitis, unspecified: Secondary | ICD-10-CM | POA: Diagnosis not present

## 2021-03-15 ENCOUNTER — Other Ambulatory Visit: Payer: Self-pay | Admitting: Hematology & Oncology

## 2021-03-15 DIAGNOSIS — H1131 Conjunctival hemorrhage, right eye: Secondary | ICD-10-CM | POA: Diagnosis not present

## 2021-03-18 ENCOUNTER — Ambulatory Visit: Payer: PPO | Admitting: Rheumatology

## 2021-03-18 ENCOUNTER — Telehealth: Payer: Self-pay

## 2021-03-18 DIAGNOSIS — A419 Sepsis, unspecified organism: Secondary | ICD-10-CM

## 2021-03-18 DIAGNOSIS — C4371 Malignant melanoma of right lower limb, including hip: Secondary | ICD-10-CM

## 2021-03-18 DIAGNOSIS — E2740 Unspecified adrenocortical insufficiency: Secondary | ICD-10-CM

## 2021-03-18 DIAGNOSIS — C7A8 Other malignant neuroendocrine tumors: Secondary | ICD-10-CM

## 2021-03-18 DIAGNOSIS — M1990 Unspecified osteoarthritis, unspecified site: Secondary | ICD-10-CM

## 2021-03-18 DIAGNOSIS — J189 Pneumonia, unspecified organism: Secondary | ICD-10-CM

## 2021-03-18 DIAGNOSIS — M19071 Primary osteoarthritis, right ankle and foot: Secondary | ICD-10-CM

## 2021-03-18 DIAGNOSIS — M170 Bilateral primary osteoarthritis of knee: Secondary | ICD-10-CM

## 2021-03-18 DIAGNOSIS — M19041 Primary osteoarthritis, right hand: Secondary | ICD-10-CM

## 2021-03-18 DIAGNOSIS — Z79899 Other long term (current) drug therapy: Secondary | ICD-10-CM

## 2021-03-18 DIAGNOSIS — Z8639 Personal history of other endocrine, nutritional and metabolic disease: Secondary | ICD-10-CM

## 2021-03-18 DIAGNOSIS — G4733 Obstructive sleep apnea (adult) (pediatric): Secondary | ICD-10-CM

## 2021-03-18 DIAGNOSIS — I2699 Other pulmonary embolism without acute cor pulmonale: Secondary | ICD-10-CM

## 2021-03-18 DIAGNOSIS — I471 Supraventricular tachycardia: Secondary | ICD-10-CM

## 2021-03-18 NOTE — Telephone Encounter (Signed)
Patient's wife James Byrd called stating James Byrd had his scan and found two new spots which are larger than the previous ones.  He will be starting radiation tomorrow 03/19/21.  She states he is scheduled for an appointment with Dr. Estanislado Pandy today at 2:15 and is checking if he needs to come in or if it can be rescheduled.  She states he didn't take his injection.  James Byrd requested a return call.

## 2021-03-18 NOTE — Telephone Encounter (Signed)
Patient's wife James Byrd advised we can cancel the appointment scheduled for today. Patient's wife states they have an appointment with the oncologist tomorrow. She was asking if he should hold his MTX. Please advise.

## 2021-03-18 NOTE — Telephone Encounter (Signed)
I returned patient's call.  I spoke with Hoyle Sauer (patient's wife).  She stated that patient is starting chemotherapy tomorrow.  I advised that he should hold off methotrexate because he is starting chemotherapy.  Once he gets clearance from the oncologist in the future then we can restart methotrexate.

## 2021-03-19 ENCOUNTER — Encounter: Payer: Self-pay | Admitting: Hematology & Oncology

## 2021-03-19 ENCOUNTER — Other Ambulatory Visit: Payer: Self-pay

## 2021-03-19 ENCOUNTER — Inpatient Hospital Stay (HOSPITAL_BASED_OUTPATIENT_CLINIC_OR_DEPARTMENT_OTHER): Payer: PPO | Admitting: Hematology & Oncology

## 2021-03-19 ENCOUNTER — Inpatient Hospital Stay: Payer: PPO | Attending: Hematology & Oncology

## 2021-03-19 ENCOUNTER — Inpatient Hospital Stay: Payer: PPO

## 2021-03-19 VITALS — BP 113/81 | HR 86 | Temp 98.2°F | Resp 18 | Wt 245.0 lb

## 2021-03-19 DIAGNOSIS — C4371 Malignant melanoma of right lower limb, including hip: Secondary | ICD-10-CM

## 2021-03-19 DIAGNOSIS — Z79899 Other long term (current) drug therapy: Secondary | ICD-10-CM | POA: Diagnosis not present

## 2021-03-19 DIAGNOSIS — Z923 Personal history of irradiation: Secondary | ICD-10-CM | POA: Diagnosis not present

## 2021-03-19 DIAGNOSIS — D509 Iron deficiency anemia, unspecified: Secondary | ICD-10-CM | POA: Diagnosis not present

## 2021-03-19 DIAGNOSIS — Z5111 Encounter for antineoplastic chemotherapy: Secondary | ICD-10-CM | POA: Diagnosis not present

## 2021-03-19 DIAGNOSIS — E271 Primary adrenocortical insufficiency: Secondary | ICD-10-CM | POA: Diagnosis not present

## 2021-03-19 DIAGNOSIS — C779 Secondary and unspecified malignant neoplasm of lymph node, unspecified: Secondary | ICD-10-CM | POA: Insufficient documentation

## 2021-03-19 DIAGNOSIS — C7A8 Other malignant neuroendocrine tumors: Secondary | ICD-10-CM

## 2021-03-19 DIAGNOSIS — I9589 Other hypotension: Secondary | ICD-10-CM | POA: Diagnosis not present

## 2021-03-19 LAB — CBC WITH DIFFERENTIAL (CANCER CENTER ONLY)
Abs Immature Granulocytes: 0.01 10*3/uL (ref 0.00–0.07)
Basophils Absolute: 0 10*3/uL (ref 0.0–0.1)
Basophils Relative: 1 %
Eosinophils Absolute: 0.3 10*3/uL (ref 0.0–0.5)
Eosinophils Relative: 8 %
HCT: 39.8 % (ref 39.0–52.0)
Hemoglobin: 13.7 g/dL (ref 13.0–17.0)
Immature Granulocytes: 0 %
Lymphocytes Relative: 28 %
Lymphs Abs: 0.8 10*3/uL (ref 0.7–4.0)
MCH: 33.1 pg (ref 26.0–34.0)
MCHC: 34.4 g/dL (ref 30.0–36.0)
MCV: 96.1 fL (ref 80.0–100.0)
Monocytes Absolute: 0.4 10*3/uL (ref 0.1–1.0)
Monocytes Relative: 12 %
Neutro Abs: 1.5 10*3/uL — ABNORMAL LOW (ref 1.7–7.7)
Neutrophils Relative %: 51 %
Platelet Count: 178 10*3/uL (ref 150–400)
RBC: 4.14 MIL/uL — ABNORMAL LOW (ref 4.22–5.81)
RDW: 13.1 % (ref 11.5–15.5)
WBC Count: 3 10*3/uL — ABNORMAL LOW (ref 4.0–10.5)
nRBC: 0 % (ref 0.0–0.2)

## 2021-03-19 LAB — LACTATE DEHYDROGENASE: LDH: 125 U/L (ref 98–192)

## 2021-03-19 LAB — CMP (CANCER CENTER ONLY)
ALT: 21 U/L (ref 0–44)
AST: 17 U/L (ref 15–41)
Albumin: 4.5 g/dL (ref 3.5–5.0)
Alkaline Phosphatase: 86 U/L (ref 38–126)
Anion gap: 9 (ref 5–15)
BUN: 20 mg/dL (ref 6–20)
CO2: 27 mmol/L (ref 22–32)
Calcium: 9.7 mg/dL (ref 8.9–10.3)
Chloride: 104 mmol/L (ref 98–111)
Creatinine: 1.01 mg/dL (ref 0.61–1.24)
GFR, Estimated: >60 mL/min (ref >60)
Glucose, Bld: 75 mg/dL (ref 70–99)
Potassium: 3.7 mmol/L (ref 3.5–5.1)
Sodium: 140 mmol/L (ref 135–145)
Total Bilirubin: 0.4 mg/dL (ref 0.3–1.2)
Total Protein: 7.3 g/dL (ref 6.5–8.1)

## 2021-03-19 LAB — IRON AND TIBC
Iron: 103 ug/dL (ref 42–163)
Saturation Ratios: 45 % (ref 20–55)
TIBC: 228 ug/dL (ref 202–409)
UIBC: 125 ug/dL (ref 117–376)

## 2021-03-19 LAB — FERRITIN: Ferritin: 1083 ng/mL — ABNORMAL HIGH (ref 24–336)

## 2021-03-19 LAB — TSH: TSH: 2.872 u[IU]/mL (ref 0.320–4.118)

## 2021-03-19 MED ORDER — SODIUM CHLORIDE 0.9 % IV SOLN: Freq: Once | INTRAVENOUS | Status: AC

## 2021-03-19 MED ORDER — SODIUM CHLORIDE 0.9 % IV SOLN
480.0000 mg | Freq: Once | INTRAVENOUS | Status: AC
  Administered 2021-03-19: 480 mg via INTRAVENOUS
  Filled 2021-03-19: qty 48

## 2021-03-19 NOTE — Patient Instructions (Signed)
[facility]  Discharge Instructions: Thank you for choosing [facility] to provide your oncology and hematology care.   If you have a lab appointment with the Cancer Center, please go directly to the Cancer Center and check in at the registration area.  Wear comfortable clothing and clothing appropriate for easy access to any Portacath or PICC line.   We strive to give you quality time with your provider. You may need to reschedule your appointment if you arrive late (15 or more minutes).  Arriving late affects you and other patients whose appointments are after yours.  Also, if you miss three or more appointments without notifying the office, you may be dismissed from the clinic at the provider's discretion.      For prescription refill requests, have your pharmacy contact our office and allow 72 hours for refills to be completed.    Today you received the following chemotherapy and/or immunotherapy agents opdivo    To help prevent nausea and vomiting after your treatment, we encourage you to take your nausea medication as directed.  BELOW ARE SYMPTOMS THAT SHOULD BE REPORTED IMMEDIATELY: *FEVER GREATER THAN 100.4 F (38 C) OR HIGHER *CHILLS OR SWEATING *NAUSEA AND VOMITING THAT IS NOT CONTROLLED WITH YOUR NAUSEA MEDICATION *UNUSUAL SHORTNESS OF BREATH *UNUSUAL BRUISING OR BLEEDING *URINARY PROBLEMS (pain or burning when urinating, or frequent urination) *BOWEL PROBLEMS (unusual diarrhea, constipation, pain near the anus) TENDERNESS IN MOUTH AND THROAT WITH OR WITHOUT PRESENCE OF ULCERS (sore throat, sores in mouth, or a toothache) UNUSUAL RASH, SWELLING OR PAIN  UNUSUAL VAGINAL DISCHARGE OR ITCHING   Items with * indicate a potential emergency and should be followed up as soon as possible or go to the Emergency Department if any problems should occur.  Please show the CHEMOTHERAPY ALERT CARD or IMMUNOTHERAPY ALERT CARD at check-in to the  Emergency Department and triage nurse. Should you have questions after your visit or need to cancel or reschedule your appointment, please contact [facility]  [phone] and follow the prompts.  Office hours are 8:00 a.m. to 4:30 p.m. Monday - Friday. Please note that voicemails left after 4:00 p.m. may not be returned until the following business day.  We are closed weekends and major holidays. You have access to a nurse at all times for urgent questions. Please call the main number to the clinic [phone] and follow the prompts.  For any non-urgent questions, you may also contact your provider using MyChart. We now offer e-Visits for anyone 18 and older to request care online for non-urgent symptoms. For details visit [email or web address].   Also download the MyChart app! Go to the app store, search "MyChart", open the app, select [facility], and log in with your MyChart username and password.  Due to Covid, a mask is required upon entering the hospital/clinic. If you do not have a mask, one will be given to you upon arrival. For doctor visits, patients may have 1 support person aged 18 or older with them. For treatment visits, patients cannot have anyone with them due to current Covid guidelines and our immunocompromised population.  

## 2021-03-19 NOTE — Progress Notes (Signed)
Okay for Opdivo while taking prednisone per Dr. Marin Olp.

## 2021-03-19 NOTE — Progress Notes (Signed)
Hematology and Oncology Follow Up Visit  James Byrd [id number] Mar 29, 1966 55 y.o. 03/19/2021   Principle Diagnosis:  Stage IIIB ([id number]) subungual melanoma of the right hallux -- nodal recurrence in the RIGHT inguinal nodes -- BRAF wt RIGHT Common Iliac node recurrence Progression of melanoma-lymph node metastasis- 02/2021 Neuroendocrine carcinoma of the pancreas Iron def anemia  Current Therapy:   Yervoy-status post 3 cycles - discontinued in September 2016 secondary to toxicity  XRT to the RIGHT inguinal basin Nivolumab 480 mg q month -- start cycle #1 on 03/19/2021 Somatuline 120 mg IM q month -- started on 05/22/2019 - d/c on 08/2019 IV Iron -- Iron Dextran given on 09/18/2019   Interim History:  James Byrd is here today for follow-up.  Unfortunately, we do have progressive disease.  We did do a PET scan on him.  This was done on 03/11/2021.  This showed new and an enlarged left retroperitoneal lymph node.  In addition, there is a new left external iliac lymph node.  He had resolution of the lymph node in the right common iliac area from radiation.  I think it is apparent that this melanoma is still trying to come back.  I think we need to get him off systemic therapy.  Unfortunately, he is not BRAF mutated.  We will give nivolumab another try.  Is been a year and a half that he has had this.  I think this will work.  I do not think he will have too much if any toxicity from it.  Thankfully, he does have some lymph node metastasis.  I know that the FDA is going to approve a new category of immunotherapy for melanoma.  These are  LAG-3 inhibitors.  He feels good.  He looks good.  He has had no cough or shortness of breath.  He has had no problems with bowels or bladder.  He has had no rashes.  He has had no fever.  Overall, I would say his performance status is ECOG 1.    Allergies: No Known Allergies  Past Medical History, Surgical history, Social history, and Family  History were reviewed and updated.  Review of Systems: Review of Systems  Constitutional:  Positive for malaise/fatigue.  HENT: Negative.    Eyes: Negative.   Respiratory:  Positive for shortness of breath.   Cardiovascular: Negative.   Gastrointestinal:  Positive for constipation and nausea.  Genitourinary: Negative.   Musculoskeletal:  Positive for joint pain and myalgias.  Skin: Negative.   Neurological:  Positive for tingling and focal weakness.  Endo/Heme/Allergies: Negative.   Psychiatric/Behavioral:  Positive for memory loss.     Physical Exam:  weight is 245 lb (111.1 kg). His oral temperature is 98.2 F (36.8 C). His blood pressure is 113/81 and his pulse is 86. His respiration is 18 and oxygen saturation is 96%.   Wt Readings from Last 3 Encounters:  03/19/21 245 lb (111.1 kg)  01/22/21 241 lb (109.3 kg)  12/16/20 236 lb 9.6 oz (107.3 kg)    Physical Exam Vitals reviewed.  HENT:     Head: Normocephalic and atraumatic.  Eyes:     Pupils: Pupils are equal, round, and reactive to light.  Cardiovascular:     Rate and Rhythm: Normal rate and regular rhythm.     Heart sounds: Normal heart sounds.  Pulmonary:     Effort: Pulmonary effort is normal.     Breath sounds: Normal breath sounds.  Abdominal:     General: Bowel sounds  are normal.     Palpations: Abdomen is soft.     Comments: Abdominal exam shows a soft abdomen.  Bowel sounds are present.  There is no palpable abdominal mass.  There is no palpable liver or spleen tip.  He has a healing right inguinal lymphadenectomy scar.  I cannot palpate any obvious adenopathy.  The left inguinal area is unremarkable.    Musculoskeletal:        General: No tenderness or deformity. Normal range of motion.     Cervical back: Normal range of motion.     Comments: The big toe on the right foot is amputated.  There is no lymphedema in the right leg.  He has good pulses in his distal extremities.  Left leg is unremarkable.   Lymphadenopathy:     Cervical: No cervical adenopathy.  Skin:    General: Skin is warm and dry.     Findings: No erythema or rash.  Neurological:     Mental Status: He is alert and oriented to person, place, and time.  Psychiatric:        Behavior: Behavior normal.        Thought Content: Thought content normal.        Judgment: Judgment normal.     Lab Results  Component Value Date   WBC 3.0 (L) 03/19/2021   HGB 13.7 03/19/2021   HCT 39.8 03/19/2021   MCV 96.1 03/19/2021   PLT 178 03/19/2021   Lab Results  Component Value Date   FERRITIN 1,083 (H) 03/19/2021   IRON 103 03/19/2021   TIBC 228 03/19/2021   UIBC 125 03/19/2021   IRONPCTSAT 45 03/19/2021   Lab Results  Component Value Date   RETICCTPCT 1.6 11/27/2019   RBC 4.14 (L) 03/19/2021   No results found for: Nils Pyle [facility] Lab Results  Component Value Date   IGGSERUM 1,046 03/25/2020   IGMSERUM 64 03/25/2020   Lab Results  Component Value Date   ALBUMINELP 4.3 03/25/2020   A1GS 0.3 03/25/2020   A2GS 0.7 03/25/2020   BETS 0.4 03/25/2020   BETA2SER 0.3 03/25/2020   GAMS 0.9 03/25/2020   SPEI  03/25/2020     Comment:     Normal Serum Protein Electrophoresis Pattern. No abnormal protein bands (M-protein) detected.      Chemistry      Component Value Date/Time   NA 140 03/19/2021 0941   NA 138 06/04/2020 1003   NA 145 06/06/2017 1316   NA 141 10/16/2015 1334   K 3.7 03/19/2021 0941   K 4.1 06/06/2017 1316   K 3.9 10/16/2015 1334   CL 104 03/19/2021 0941   CL 108 06/06/2017 1316   CO2 27 03/19/2021 0941   CO2 26 06/06/2017 1316   CO2 21 (L) 10/16/2015 1334   BUN 20 03/19/2021 0941   BUN 17 06/04/2020 1003   BUN 19 06/06/2017 1316   BUN 20.0 10/16/2015 1334   CREATININE 1.01 03/19/2021 0941   CREATININE 1.0 06/06/2017 1316   CREATININE 1.0 10/16/2015 1334      Component Value Date/Time   CALCIUM 9.7 03/19/2021 0941   CALCIUM 9.4 06/06/2017 1316   CALCIUM 9.7  10/16/2015 1334   ALKPHOS 86 03/19/2021 0941   ALKPHOS 78 06/06/2017 1316   ALKPHOS 62 10/16/2015 1334   AST 17 03/19/2021 0941   AST 16 10/16/2015 1334   ALT 21 03/19/2021 0941   ALT 27 06/06/2017 1316   ALT 11 10/16/2015 1334   BILITOT 0.4  03/19/2021 0941   BILITOT 0.49 10/16/2015 1334      Impression and Plan: Mr. Haji is a very pleasant 55 yo caucasian gentleman with history of stage IIIB melanoma of the right hallux that was subungual with one microscopic positive inguinal lymph node. He completed 3 cycles of Yervoy but stopped due to side effects.   He then had a recurrence in the right inguinal area.  He had this resected.  He then underwent radiation therapy.  This was completed I think in March 2021.  He now has had another recurrence.  As such, he had radiosurgery for this.  This is clearly helped him.  However, he now has new disease.  We will get him back on nivolumab.  I would like to believe that he should respond.  If not, then I think we are clearly looking at clinical trials for him.  Again, he is wild-type for the BRAF mutation.  I know that he is very motivated.  He has wonderful support from his wife and family.  We will plan for a follow-up PET scan after 4 cycles of nivolumab.  We will plan to get him back in 4 weeks for his second cycle.      [name], MD 9/30/20223:45 PM

## 2021-03-26 ENCOUNTER — Inpatient Hospital Stay: Payer: PPO | Admitting: Hematology & Oncology

## 2021-03-26 ENCOUNTER — Inpatient Hospital Stay: Payer: PPO

## 2021-03-29 ENCOUNTER — Other Ambulatory Visit: Payer: Self-pay | Admitting: *Deleted

## 2021-03-29 DIAGNOSIS — C7A8 Other malignant neuroendocrine tumors: Secondary | ICD-10-CM

## 2021-03-29 DIAGNOSIS — M06061 Rheumatoid arthritis without rheumatoid factor, right knee: Secondary | ICD-10-CM

## 2021-03-29 DIAGNOSIS — M06062 Rheumatoid arthritis without rheumatoid factor, left knee: Secondary | ICD-10-CM

## 2021-03-29 DIAGNOSIS — C4371 Malignant melanoma of right lower limb, including hip: Secondary | ICD-10-CM

## 2021-03-29 DIAGNOSIS — G4733 Obstructive sleep apnea (adult) (pediatric): Secondary | ICD-10-CM

## 2021-03-29 MED ORDER — OXYCODONE-ACETAMINOPHEN 5-325 MG PO TABS: ORAL_TABLET | ORAL | 0 refills | Status: DC

## 2021-03-29 MED ORDER — METHYLPREDNISOLONE 4 MG PO TBPK: ORAL_TABLET | ORAL | 0 refills | Status: DC

## 2021-04-06 ENCOUNTER — Telehealth: Payer: Self-pay

## 2021-04-06 NOTE — Telephone Encounter (Signed)
Per Dr Marin Olp monitor swelling and let us know if anything changes or gets worse. Hoyle Sauer advised and states understanding.

## 2021-04-06 NOTE — Telephone Encounter (Signed)
Spoke with pt's wife Hoyle Sauer who is concerned about pt's left knee swelling. Pt is currently on Opdivo and had similar issues with swelling this last time he was on this treatment. Previously pt had swelling in both knees and dealt with baker cysts.   Hoyle Sauer states the swelling in the left leg had being going on for 2 weeks however when pt was on the Prednisone taper dose it decreased. Wife would like to know if there is anything he needs to do to help with the swelling or prevent it from getting worse?

## 2021-04-11 ENCOUNTER — Encounter: Payer: Self-pay | Admitting: Hematology & Oncology

## 2021-04-12 ENCOUNTER — Encounter: Payer: Self-pay | Admitting: Hematology & Oncology

## 2021-04-12 ENCOUNTER — Other Ambulatory Visit: Payer: Self-pay | Admitting: Family

## 2021-04-12 DIAGNOSIS — M06061 Rheumatoid arthritis without rheumatoid factor, right knee: Secondary | ICD-10-CM

## 2021-04-12 DIAGNOSIS — C7A8 Other malignant neuroendocrine tumors: Secondary | ICD-10-CM

## 2021-04-12 MED ORDER — METHYLPREDNISOLONE 4 MG PO TBPK: ORAL_TABLET | ORAL | 0 refills | Status: DC

## 2021-04-12 NOTE — Telephone Encounter (Signed)
Spoke with wife who had not received the MyChart message yet. She stated the swelling in pts' leg is worse this week than last. Pt is elevating his leg above his heart to assist with decreasing the swelling. Declined fever but area is warm to touch. Wife states that the swelling is now in pt's right elbow and wrist. She states this occurred last time pt was on this treatment. Pt has been taking Tylenol ES and Oxycodone for pain. Wife is concerned pt may need to go back on a daily steroid dose.   Per Judson Roch if pt is having a lot of pain he needs to go to [facility] If not in a lot a pain then she will refill the Medrol dosepak. Sarah recommend waiting until Dr Marin Olp returns to discuss daily steroid use.   Wife advised and states understanding. She does not think the pain is too bad to where he needs to go to the ER. Wife also states she can f/u with Sarah at next in office apt this Friday, 04/16/21. Wife would like Rx sent to  [facility].

## 2021-04-15 ENCOUNTER — Telehealth: Payer: Self-pay | Admitting: *Deleted

## 2021-04-15 NOTE — Telephone Encounter (Signed)
Per secure chat - called and lvm about moving appointment up the same day for treatment. Requested call back to confirm.

## 2021-04-16 ENCOUNTER — Inpatient Hospital Stay: Payer: PPO | Admitting: Family

## 2021-04-16 ENCOUNTER — Encounter: Payer: Self-pay | Admitting: Family

## 2021-04-16 ENCOUNTER — Inpatient Hospital Stay: Payer: PPO | Attending: Hematology & Oncology

## 2021-04-16 ENCOUNTER — Other Ambulatory Visit: Payer: Self-pay

## 2021-04-16 ENCOUNTER — Inpatient Hospital Stay: Payer: PPO

## 2021-04-16 VITALS — BP 117/76 | HR 60 | Temp 98.0°F | Resp 18 | Wt 249.0 lb

## 2021-04-16 DIAGNOSIS — Z923 Personal history of irradiation: Secondary | ICD-10-CM | POA: Diagnosis not present

## 2021-04-16 DIAGNOSIS — C4371 Malignant melanoma of right lower limb, including hip: Secondary | ICD-10-CM

## 2021-04-16 DIAGNOSIS — C7A8 Other malignant neuroendocrine tumors: Secondary | ICD-10-CM | POA: Diagnosis not present

## 2021-04-16 DIAGNOSIS — E032 Hypothyroidism due to medicaments and other exogenous substances: Secondary | ICD-10-CM | POA: Diagnosis not present

## 2021-04-16 DIAGNOSIS — Z5111 Encounter for antineoplastic chemotherapy: Secondary | ICD-10-CM | POA: Diagnosis not present

## 2021-04-16 DIAGNOSIS — D509 Iron deficiency anemia, unspecified: Secondary | ICD-10-CM

## 2021-04-16 DIAGNOSIS — C779 Secondary and unspecified malignant neoplasm of lymph node, unspecified: Secondary | ICD-10-CM | POA: Insufficient documentation

## 2021-04-16 DIAGNOSIS — Z79899 Other long term (current) drug therapy: Secondary | ICD-10-CM | POA: Insufficient documentation

## 2021-04-16 LAB — CBC WITH DIFFERENTIAL (CANCER CENTER ONLY)
Abs Immature Granulocytes: 0.01 10*3/uL (ref 0.00–0.07)
Basophils Absolute: 0 10*3/uL (ref 0.0–0.1)
Basophils Relative: 1 %
Eosinophils Absolute: 0.1 10*3/uL (ref 0.0–0.5)
Eosinophils Relative: 3 %
HCT: 40.1 % (ref 39.0–52.0)
Hemoglobin: 13.6 g/dL (ref 13.0–17.0)
Immature Granulocytes: 0 %
Lymphocytes Relative: 24 %
Lymphs Abs: 1.2 10*3/uL (ref 0.7–4.0)
MCH: 32.6 pg (ref 26.0–34.0)
MCHC: 33.9 g/dL (ref 30.0–36.0)
MCV: 96.2 fL (ref 80.0–100.0)
Monocytes Absolute: 0.4 10*3/uL (ref 0.1–1.0)
Monocytes Relative: 9 %
Neutro Abs: 3.1 10*3/uL (ref 1.7–7.7)
Neutrophils Relative %: 63 %
Platelet Count: 184 10*3/uL (ref 150–400)
RBC: 4.17 MIL/uL — ABNORMAL LOW (ref 4.22–5.81)
RDW: 12.6 % (ref 11.5–15.5)
WBC Count: 4.9 10*3/uL (ref 4.0–10.5)
nRBC: 0 % (ref 0.0–0.2)

## 2021-04-16 LAB — CMP (CANCER CENTER ONLY)
ALT: 15 U/L (ref 0–44)
AST: 15 U/L (ref 15–41)
Albumin: 4.4 g/dL (ref 3.5–5.0)
Alkaline Phosphatase: 67 U/L (ref 38–126)
Anion gap: 8 (ref 5–15)
BUN: 25 mg/dL — ABNORMAL HIGH (ref 6–20)
CO2: 27 mmol/L (ref 22–32)
Calcium: 9.7 mg/dL (ref 8.9–10.3)
Chloride: 106 mmol/L (ref 98–111)
Creatinine: 1 mg/dL (ref 0.61–1.24)
GFR, Estimated: >60 mL/min (ref >60)
Glucose, Bld: 86 mg/dL (ref 70–99)
Potassium: 3.8 mmol/L (ref 3.5–5.1)
Sodium: 141 mmol/L (ref 135–145)
Total Bilirubin: 0.4 mg/dL (ref 0.3–1.2)
Total Protein: 7.3 g/dL (ref 6.5–8.1)

## 2021-04-16 LAB — LACTATE DEHYDROGENASE: LDH: 154 U/L (ref 98–192)

## 2021-04-16 MED ORDER — SODIUM CHLORIDE 0.9 % IV SOLN: Freq: Once | INTRAVENOUS | Status: AC

## 2021-04-16 MED ORDER — SODIUM CHLORIDE 0.9 % IV SOLN
480.0000 mg | Freq: Once | INTRAVENOUS | Status: AC
  Administered 2021-04-16: 480 mg via INTRAVENOUS
  Filled 2021-04-16: qty 48

## 2021-04-16 NOTE — Progress Notes (Signed)
Lab work reviewed by NP, ok to treat despite counts.

## 2021-04-16 NOTE — Progress Notes (Addendum)
Hematology and Oncology Follow Up Visit  James Byrd [id number] 1965-08-10 55 y.o. 04/16/2021   Principle Diagnosis:  Stage IIIB ([id number]) subungual melanoma of the right hallux -- nodal recurrence in the RIGHT inguinal nodes -- BRAF wt RIGHT Common Iliac node recurrence Progression of melanoma-lymph node metastasis- 02/2021 Neuroendocrine carcinoma of the pancreas Iron def anemia   Past Therapy:        Yervoy-status post 3 cycles - discontinued in September 2016 secondary to toxicity Somatuline 120 mg IM q month -- started on 05/22/2019 - d/c on 08/2019 XRT to the RIGHT inguinal basin  Current Therapy:   Nivolumab 480 mg q month -- started on 03/19/2021, s/p cycle 1 IV Iron as indicated    Interim History:  Mr. James Byrd is here today for follow-up and treatment. He is doing better today. The steroid dose pack helped resolve the swelling in his left knee. No redness or bruising noted.  Mild SOB with over exertion.  He had one episode of dizziness on Wednesday morning but this resolved once he ate.  No fever, chills, n/v, cough, rash, chest pain, palpitations, abdominal pain or changes in bowel or bladder habits.  No adenopathy or lymphedema noted on exam. The neuropathy in his lower extremities and fingertips is unchanged.  He was able to play golf once this week which was awesome.  He also enjoys spending time with his two sweet grand babies.  No blood loss noted. No petechiae.  He has a good appetite and is staying well hydrated throughout the day. His weight is stable at 249 lbs.   ECOG Performance Status: 1 - Symptomatic but completely ambulatory  Medications:  Allergies as of 04/16/2021   No Known Allergies      Medication List        Accurate as of April 16, 2021 10:49 AM. If you have any questions, ask your nurse or doctor.          acetaminophen 650 MG CR tablet Commonly known as: TYLENOL Take 1,300 mg by mouth daily as needed for pain.   augmented  betamethasone dipropionate 0.05 % cream Commonly known as: DIPROLENE-AF Apply 1 application topically 2 (two) times daily as needed (psoriasis).   chlorpheniramine-HYDROcodone 10-8 MG/5ML Suer Commonly known as: TUSSIONEX Take 5 mLs by mouth 2 (two) times daily as needed.   cyanocobalamin 2000 MCG tablet Take 2,000 mcg by mouth daily. Vitamin b12   diclofenac sodium 1 % Gel Commonly known as: VOLTAREN Apply 2 g topically daily as needed (pain).   DULoxetine 20 MG capsule Commonly known as: CYMBALTA Take 20 mg by mouth daily.   FISH OIL PO Take by mouth daily.   folic acid 1 MG tablet Commonly known as: FOLVITE TAKE TWO TABLETS BY MOUTH ONCE DAILY   gabapentin 300 MG capsule Commonly known as: NEURONTIN Take 300 mg by mouth daily.   GLUCOSAMINE-CHONDROITIN PO Take 1 tablet by mouth 2 (two) times daily.   hydrocortisone 5 MG tablet Commonly known as: CORTEF Take 1 tablet (5 mg total) by mouth 3 (three) times daily.   methotrexate (PF) 50 MG/2ML injection INJECT 0.7m SUBCUTANEOUSLY ONCE A WEEK   methylPREDNISolone 4 MG Tbpk tablet Commonly known as: MEDROL DOSEPAK Take as directed.   multivitamin with minerals Tabs tablet Take 1 tablet by mouth daily.   neomycin-polymyxin b-dexamethasone 3.5-10000-0.1 Susp Commonly known as: MAXITROL Place into the right eye.   ondansetron 4 MG disintegrating tablet Commonly known as: ZOFRAN-ODT Take 4 mg by mouth every 6 (  six) hours as needed.   oxyCODONE-acetaminophen 5-325 MG tablet Commonly known as: PERCOCET/ROXICET TAKE ONE TABLET BY MOUTH EVERY 8 HOURS AS NEEDED FOR SEVERE PAIN.   predniSONE 20 MG tablet Commonly known as: DELTASONE Take 40 mg by mouth daily.   promethazine-dextromethorphan 6.25-15 MG/5ML syrup Commonly known as: PROMETHAZINE-DM Take 10 mLs by mouth at bedtime.   pyridOXINE 100 MG tablet Commonly known as: VITAMIN B-6 Take 100 mg by mouth daily.   Testosterone 20.25 MG/ACT (1.62%) Gel Apply  20.25 mg topically See admin instructions. Apply 1 pump (20.25 mg) topically on each shoulder once daily   TUBERCULIN SYR 1CC/27GX1/2" 27G X 1/2" 1 ML Misc Commonly known as: B-D TB SYRINGE 1CC/27GX1/2" Use 1 syringe once weekly to inject methotrexate.   vitamin C 1000 MG tablet Take 1,000 mg by mouth daily.   Vitamin D 50 MCG (2000 UT) tablet Take 2,000 Units by mouth daily.        Allergies: No Known Allergies  Past Medical History, Surgical history, Social history, and Family History were reviewed and updated.  Review of Systems: All other 10 point review of systems is negative.   Physical Exam:  vitals were not taken for this visit.   Wt Readings from Last 3 Encounters:  03/19/21 245 lb (111.1 kg)  01/22/21 241 lb (109.3 kg)  12/16/20 236 lb 9.6 oz (107.3 kg)    Ocular: Sclerae unicteric, pupils equal, round and reactive to light Ear-nose-throat: Oropharynx clear, dentition fair Lymphatic: No cervical, supraclavicular or axillary adenopathy Lungs no rales or rhonchi, good excursion bilaterally Heart regular rate and rhythm, no murmur appreciated Abd soft, nontender, positive bowel sounds, no liver or spleen tip palpated on exam, no fluid wave MSK no focal spinal tenderness, no joint edema Neuro: non-focal, well-oriented, appropriate affect Breasts: Deferred   Lab Results  Component Value Date   WBC 4.9 04/16/2021   HGB 13.6 04/16/2021   HCT 40.1 04/16/2021   MCV 96.2 04/16/2021   PLT 184 04/16/2021   Lab Results  Component Value Date   FERRITIN 1,083 (H) 03/19/2021   IRON 103 03/19/2021   TIBC 228 03/19/2021   UIBC 125 03/19/2021   IRONPCTSAT 45 03/19/2021   Lab Results  Component Value Date   RETICCTPCT 1.6 11/27/2019   RBC 4.17 (L) 04/16/2021   No results found for: Nils Pyle [facility] Lab Results  Component Value Date   IGGSERUM 1,046 03/25/2020   IGMSERUM 64 03/25/2020   Lab Results  Component Value Date   ALBUMINELP 4.3  03/25/2020   A1GS 0.3 03/25/2020   A2GS 0.7 03/25/2020   BETS 0.4 03/25/2020   BETA2SER 0.3 03/25/2020   GAMS 0.9 03/25/2020   SPEI  03/25/2020     Comment:     Normal Serum Protein Electrophoresis Pattern. No abnormal protein bands (M-protein) detected.      Chemistry      Component Value Date/Time   NA 140 03/19/2021 0941   NA 138 06/04/2020 1003   NA 145 06/06/2017 1316   NA 141 10/16/2015 1334   K 3.7 03/19/2021 0941   K 4.1 06/06/2017 1316   K 3.9 10/16/2015 1334   CL 104 03/19/2021 0941   CL 108 06/06/2017 1316   CO2 27 03/19/2021 0941   CO2 26 06/06/2017 1316   CO2 21 (L) 10/16/2015 1334   BUN 20 03/19/2021 0941   BUN 17 06/04/2020 1003   BUN 19 06/06/2017 1316   BUN 20.0 10/16/2015 1334   CREATININE 1.01 03/19/2021  0941   CREATININE 1.0 06/06/2017 1316   CREATININE 1.0 10/16/2015 1334      Component Value Date/Time   CALCIUM 9.7 03/19/2021 0941   CALCIUM 9.4 06/06/2017 1316   CALCIUM 9.7 10/16/2015 1334   ALKPHOS 86 03/19/2021 0941   ALKPHOS 78 06/06/2017 1316   ALKPHOS 62 10/16/2015 1334   AST 17 03/19/2021 0941   AST 16 10/16/2015 1334   ALT 21 03/19/2021 0941   ALT 27 06/06/2017 1316   ALT 11 10/16/2015 1334   BILITOT 0.4 03/19/2021 0941   BILITOT 0.49 10/16/2015 1334       Impression and Plan: Mr. Hashimi is a very pleasant 55 yo caucasian gentleman with history of stage IIIB melanoma of the right hallux that was subungual with one microscopic positive inguinal lymph node. He completed 3 cycles of Yervoy but stopped due to side effects.  He had a recurrence in the right inguinal area that was resected and he completed radiation therapy in March 2021.  He then had another recurrence treated with radiosurgery and has now started treatment again with Nivolumab.  He has had some left knee swelling and joint pain with treatment. The steroid dose pack has helped. I will discuss having him start a low dose steroid with Dr. Marin Olp next week when he returns  from vacation.  We will proceed with cycle 2 today as planned and will repeat a PET scan after cycle 4.  Follow-up in 4 weeks.  He can contact our office with any questions or concerns.   Lottie Dawson, NP 10/28/202210:49 AM  Addendum: Damaris Schooner with Dr. Marin Olp and ok to start patient on low dose prednisone 10 mg PO daily for RA symptoms. Script sent.   04/19/2021 08:41

## 2021-04-16 NOTE — Progress Notes (Signed)
RN confirmed with patient he took last dose of medrol dosepak today.  T.O. Thane Edu RN/Jaloni Sorber Ronnald Ramp PharmD

## 2021-04-16 NOTE — Patient Instructions (Signed)
Nivolumab injection What is this medication? NIVOLUMAB (nye VOL ue mab) is a monoclonal antibody. It treats certain types of cancer. Some of the cancers treated are colon cancer, head and neck cancer,Hodgkin lymphoma, lung cancer, and melanoma. This medicine may be used for other purposes; ask your health care provider orpharmacist if you have questions. COMMON BRAND NAME(S): Opdivo What should I tell my care team before I take this medication? They need to know if you have any of these conditions: autoimmune diseases like Crohn's disease, ulcerative colitis, or lupus have had or planning to have an allogeneic stem cell transplant (uses someone else's stem cells) history of chest radiation history of organ transplant nervous system problems like myasthenia gravis or Guillain-Barre syndrome an unusual or allergic reaction to nivolumab, other medicines, foods, dyes, or preservatives pregnant or trying to get pregnant breast-feeding How should I use this medication? This medicine is for infusion into a vein. It is given by a health careprofessional in a hospital or clinic setting. A special MedGuide will be given to you before each treatment. Be sure to readthis information carefully each time. Talk to your pediatrician regarding the use of this medicine in children. While this drug may be prescribed for children as young as 12 years for selectedconditions, precautions do apply. Overdosage: If you think you have taken too much of this medicine contact apoison control center or emergency room at once. NOTE: This medicine is only for you. Do not share this medicine with others. What if I miss a dose? It is important not to miss your dose. Call your doctor or health careprofessional if you are unable to keep an appointment. What may interact with this medication? Interactions have not been studied. This list may not describe all possible interactions. Give your health care provider a list of all  the medicines, herbs, non-prescription drugs, or dietary supplements you use. Also tell them if you smoke, drink alcohol, or use illegaldrugs. Some items may interact with your medicine. What should I watch for while using this medication? This drug may make you feel generally unwell. Continue your course of treatmenteven though you feel ill unless your doctor tells you to stop. You may need blood work done while you are taking this medicine. Do not become pregnant while taking this medicine or for 5 months after stopping it. Women should inform their doctor if they wish to become pregnant or think they might be pregnant. There is a potential for serious side effects to an unborn child. Talk to your health care professional or pharmacist for more information. Do not breast-feed an infant while taking this medicine orfor 5 months after stopping it. What side effects may I notice from receiving this medication? Side effects that you should report to your doctor or health care professionalas soon as possible: allergic reactions like skin rash, itching or hives, swelling of the face, lips, or tongue breathing problems blood in the urine bloody or watery diarrhea or black, tarry stools changes in emotions or moods changes in vision chest pain cough dizziness feeling faint or lightheaded, falls fever, chills headache with fever, neck stiffness, confusion, loss of memory, sensitivity to light, hallucination, loss of contact with reality, or seizures joint pain mouth sores redness, blistering, peeling or loosening of the skin, including inside the mouth severe muscle pain or weakness signs and symptoms of high blood sugar such as dizziness; dry mouth; dry skin; fruity breath; nausea; stomach pain; increased hunger or thirst; increased urination signs and symptoms of   kidney injury like trouble passing urine or change in the amount of urine signs and symptoms of liver injury like dark yellow or brown  urine; general ill feeling or flu-like symptoms; light-colored stools; loss of appetite; nausea; right upper belly pain; unusually weak or tired; yellowing of the eyes or skin swelling of the ankles, feet, hands trouble passing urine or change in the amount of urine unusually weak or tired weight gain or loss Side effects that usually do not require medical attention (report to yourdoctor or health care professional if they continue or are bothersome): bone pain constipation decreased appetite diarrhea muscle pain nausea, vomiting tiredness This list may not describe all possible side effects. Call your doctor for medical advice about side effects. You may report side effects to FDA at1-800-FDA-1088. Where should I keep my medication? This drug is given in a hospital or clinic and will not be stored at home. NOTE: This sheet is a summary. It may not cover all possible information. If you have questions about this medicine, talk to your doctor, pharmacist, orhealth care provider.  2022 Elsevier/Gold Standard (2019-10-09 10:08:25)  

## 2021-04-19 ENCOUNTER — Encounter: Payer: Self-pay | Admitting: *Deleted

## 2021-04-19 ENCOUNTER — Other Ambulatory Visit: Payer: Self-pay | Admitting: Family

## 2021-04-19 ENCOUNTER — Telehealth: Payer: Self-pay | Admitting: *Deleted

## 2021-04-19 DIAGNOSIS — C779 Secondary and unspecified malignant neoplasm of lymph node, unspecified: Secondary | ICD-10-CM

## 2021-04-19 LAB — TSH: TSH: 3.298 u[IU]/mL (ref 0.320–4.118)

## 2021-04-19 MED ORDER — PREDNISONE 10 MG PO TABS: 10.0000 mg | ORAL_TABLET | Freq: Every day | ORAL | 2 refills | Status: DC

## 2021-04-19 NOTE — Telephone Encounter (Signed)
Per 04/16/21 los - called and gave upcoming appointments - confirmed

## 2021-05-04 ENCOUNTER — Other Ambulatory Visit: Payer: Self-pay

## 2021-05-07 IMAGING — MR MR 3D RECON AT SCANNER
10 of 22 series · 20 of 48 positions shown · IV contrast (gadavist)
Comparison: PET-CT from 04/30/2019

CLINICAL DATA: Staging melanoma

EXAM:
MRI ABDOMEN WITHOUT AND WITH CONTRAST
TECHNIQUE: Multiplanar multisequence MR imaging of the abdomen was performed
both before and after the administration of intravenous contrast.
CONTRAST:  10mL GADAVIST GADOBUTROL 1 MMOL/ML IV SOLN

[Series 1: 3 plane non · axial · 8.0mm · 0.78mm/px · z∈[-36,+200]mm · 2 of 13 slices shown]
[im 1/13]
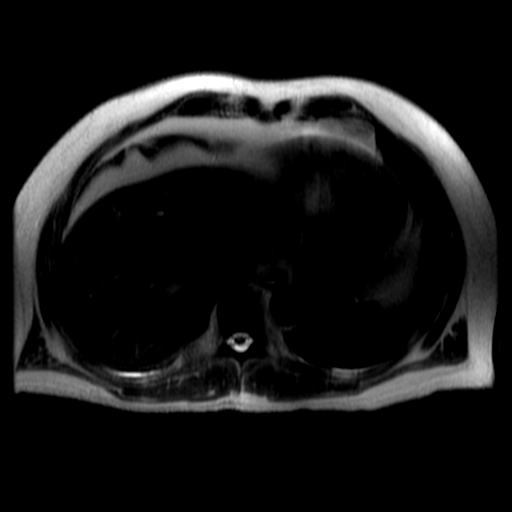
[im 13/13]
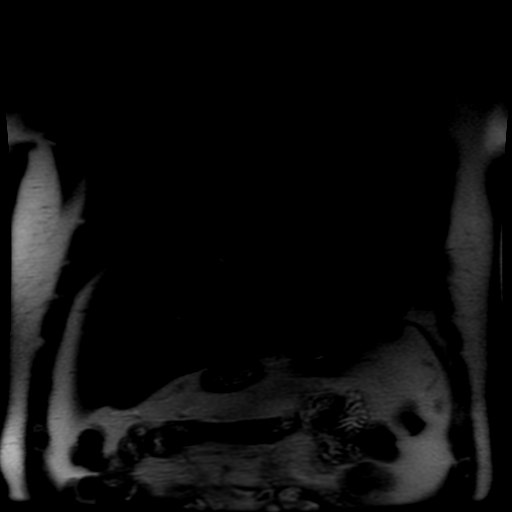

[Series 3: T2 fat-sat · axial · 5.0mm · 0.78mm/px · z∈[-239,+46]mm · 2 of 58 slices shown]
[im 1/58]
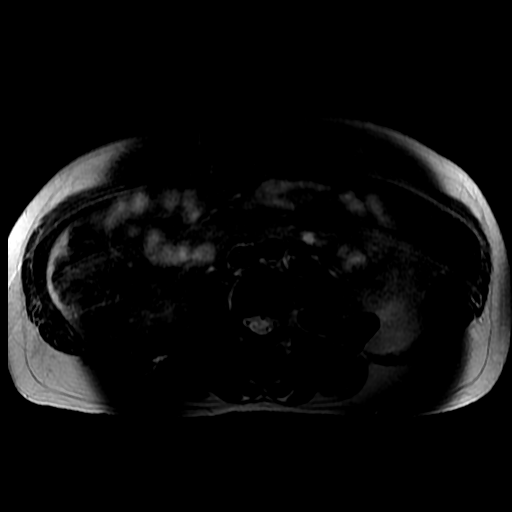
[im 58/58]
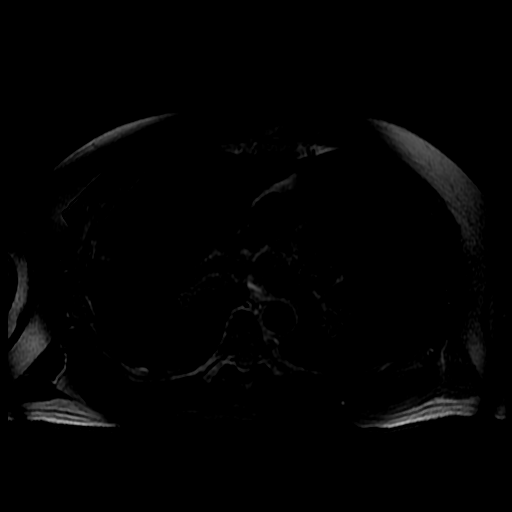

[Series 4: DWI b500 · axial · 6.0mm · 1.48mm/px · z∈[-231,+50]mm · 2 of 74 slices shown]
[im 1/74]
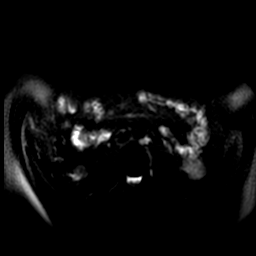
[im 74/74]
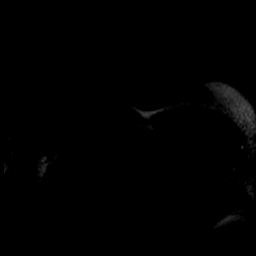

[Series 6: T2 · axial · 5.0mm · 0.78mm/px · 1 of 55 slices shown (1 of 2)]
[im 1/55]
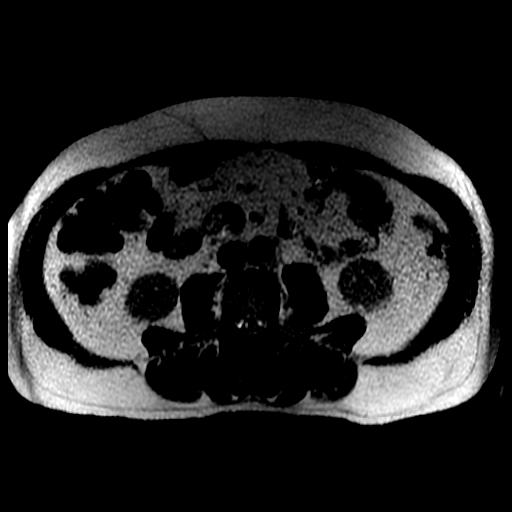

[Series 7: bSSFP · coronal · 5.0mm · 0.78mm/px · 1 of 51 slices shown]
[im 1/51]
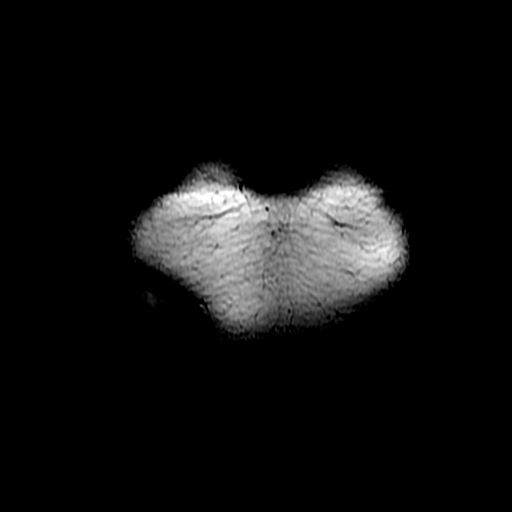

[Series 8: T2 · coronal · 5.0mm · 0.78mm/px · 1 of 51 slices shown (2 of 2)]
[im 1/51]
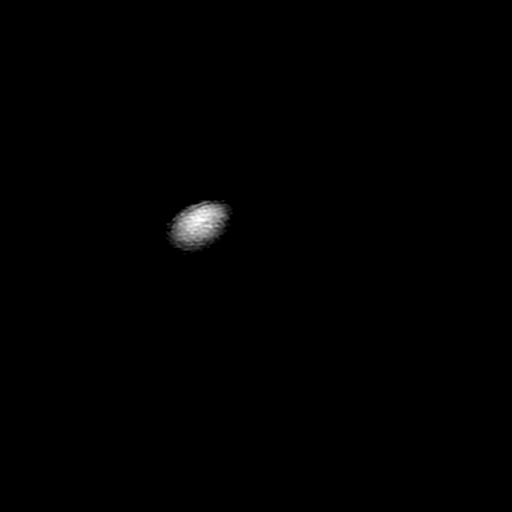

[Series 9: ax dualecho bh · axial · 5.0mm · 0.78mm/px · z∈[-244,+26]mm · 3 of 110 slices shown]
[im 1/110]
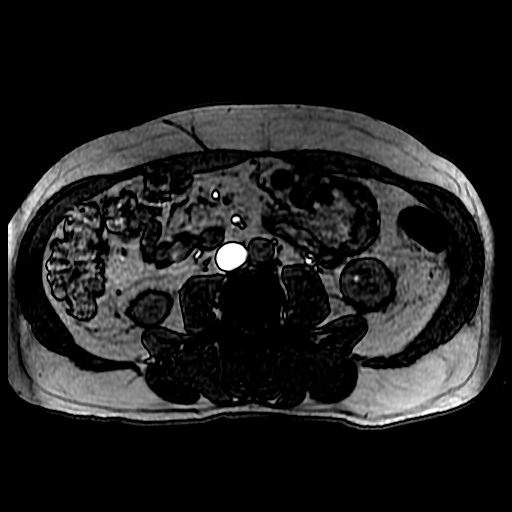
[im 55/110]
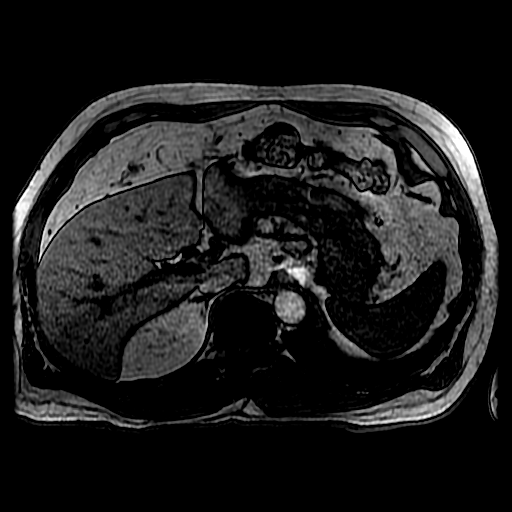
[im 110/110]
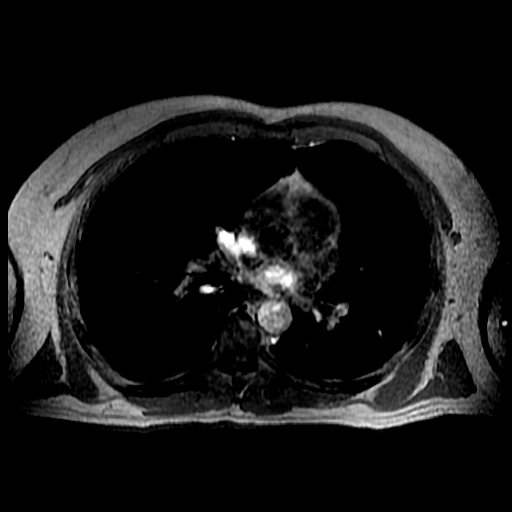

[Series 400: DWI · axial · 6.0mm · 1.48mm/px · 1 of 37 slices shown]
[im 1/37]
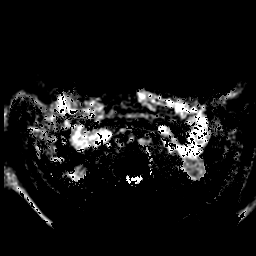

[Series 500: reformatted · axial · 1.6mm · 0.66mm/px · z∈[-137,-7]mm · 5 of 181 slices shown (1 of 2)]
[im 1/181]
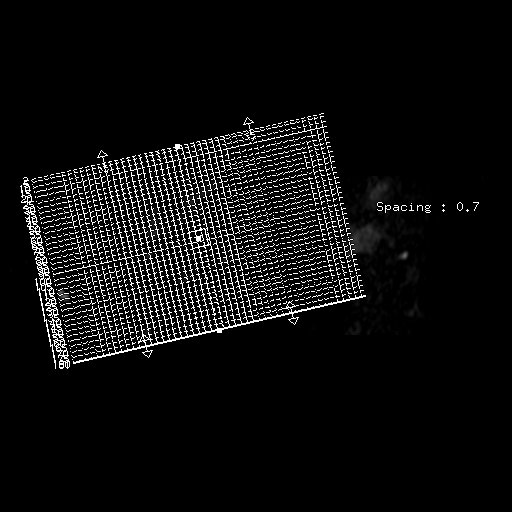
[im 46/181]
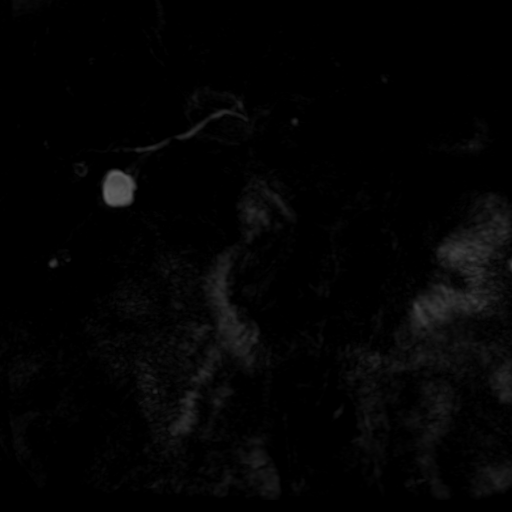
[im 91/181]
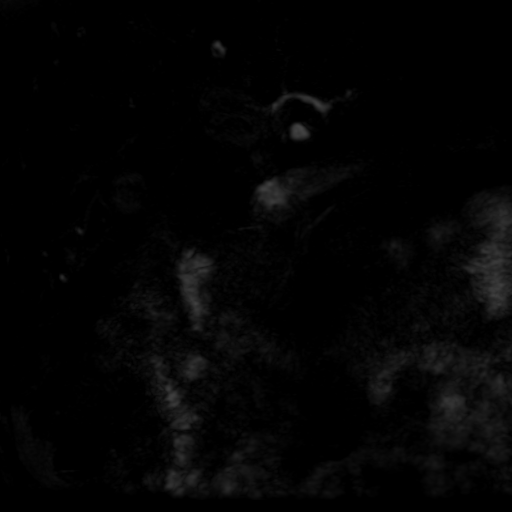
[im 136/181]
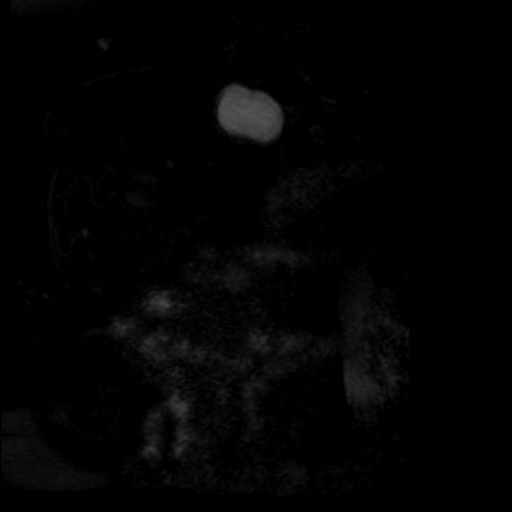
[im 181/181]
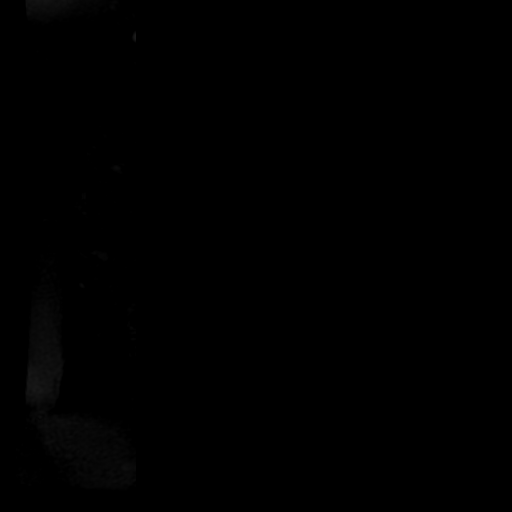

[Series 501: reformatted · coronal · 100.0mm · 0.51mm/px · 2 of 180 slices shown (2 of 2)]
[im 1/180]
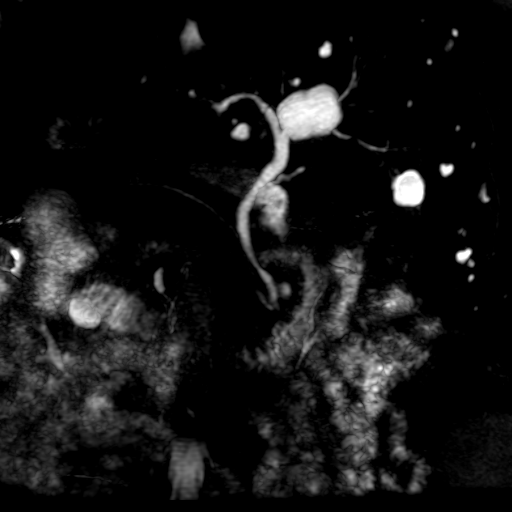
[im 45/180]
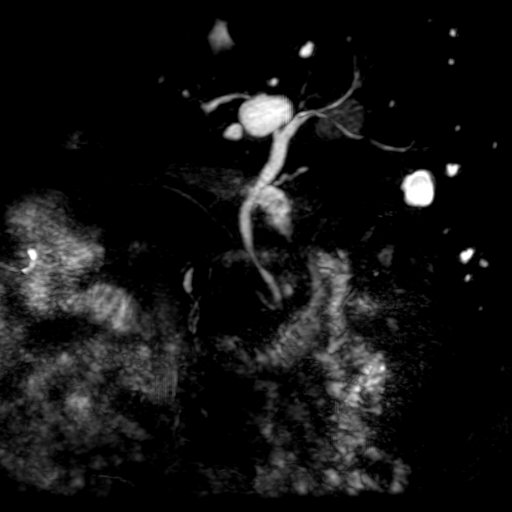

[20 of 48 positions shown; findings below may reference images not displayed]

FINDINGS: Lower chest: Trace pleural fluid noted bilaterally .

Hepatobiliary: Multiple liver cysts identified. The largest is in
the subcapsular left lobe of liver measuring 3.1 cm no suspicious
liver abnormality identified. Previous cholecystectomy. No biliary
dilatation

Pancreas: On the T2 weighted and diffusion sequences there is
ill-defined increased signal within the distal tail of pancreas and
uncinate process of the pancreas which corresponds to the areas of
increased FDG uptake on recent PET-CT. On the arterial phase
postcontrast images there is relative hypoenhancement within these 2
areas. Within the uncinate process of pancreas this area measures
approximately 1.8 cm, image [id number] and in tail of pancreas this
area measured 3.5 cm, image [id number].

Spleen:  Within normal limits in size and appearance.

Adrenals/Urinary Tract: Normal adrenal glands. Bilateral renal
cortical scarring. No hydronephrosis. Bilateral kidney cysts noted.

Stomach/Bowel: Visualized portions within the abdomen are
unremarkable.

Vascular/Lymphatic: No pathologically enlarged lymph nodes
identified. No abdominal aortic aneurysm demonstrated.

Other:  No free fluid or fluid collections.

Musculoskeletal: No suspicious bone lesions identified.
IMPRESSION: 1. Abnormal increased signal on the T2 weighted and diffusion
sequences with relative hypoenhancement on the arterial phase
sequences correspond to the areas of increased FDG uptake on recent
PET scan in the uncinate process of pancreas and distal tail of
pancreas. In the absence of signs or symptoms of pancreatitis
findings are concerning for infiltrative process. Melanoma
metastasis cannot be excluded. Consider further evaluation with
endoscopic ultrasound with possible tissue sampling if clinically
indicated.
2. Liver and kidney cysts.

## 2021-05-12 ENCOUNTER — Encounter: Payer: Self-pay | Admitting: Hematology & Oncology

## 2021-05-14 ENCOUNTER — Other Ambulatory Visit: Payer: Self-pay | Admitting: Hematology & Oncology

## 2021-05-15 ENCOUNTER — Encounter: Payer: Self-pay | Admitting: Hematology & Oncology

## 2021-05-17 ENCOUNTER — Inpatient Hospital Stay: Payer: PPO | Attending: Hematology & Oncology

## 2021-05-17 ENCOUNTER — Inpatient Hospital Stay (HOSPITAL_BASED_OUTPATIENT_CLINIC_OR_DEPARTMENT_OTHER): Payer: PPO | Admitting: Hematology & Oncology

## 2021-05-17 ENCOUNTER — Encounter: Payer: Self-pay | Admitting: Hematology & Oncology

## 2021-05-17 ENCOUNTER — Other Ambulatory Visit: Payer: Self-pay

## 2021-05-17 ENCOUNTER — Inpatient Hospital Stay: Payer: PPO

## 2021-05-17 DIAGNOSIS — C779 Secondary and unspecified malignant neoplasm of lymph node, unspecified: Secondary | ICD-10-CM | POA: Insufficient documentation

## 2021-05-17 DIAGNOSIS — Z79899 Other long term (current) drug therapy: Secondary | ICD-10-CM | POA: Diagnosis not present

## 2021-05-17 DIAGNOSIS — D509 Iron deficiency anemia, unspecified: Secondary | ICD-10-CM | POA: Insufficient documentation

## 2021-05-17 DIAGNOSIS — C4371 Malignant melanoma of right lower limb, including hip: Secondary | ICD-10-CM | POA: Diagnosis not present

## 2021-05-17 DIAGNOSIS — M069 Rheumatoid arthritis, unspecified: Secondary | ICD-10-CM | POA: Diagnosis not present

## 2021-05-17 DIAGNOSIS — Z923 Personal history of irradiation: Secondary | ICD-10-CM | POA: Insufficient documentation

## 2021-05-17 DIAGNOSIS — Z5111 Encounter for antineoplastic chemotherapy: Secondary | ICD-10-CM | POA: Diagnosis not present

## 2021-05-17 DIAGNOSIS — E032 Hypothyroidism due to medicaments and other exogenous substances: Secondary | ICD-10-CM

## 2021-05-17 LAB — CMP (CANCER CENTER ONLY)
ALT: 22 U/L (ref 0–44)
AST: 16 U/L (ref 15–41)
Albumin: 4.2 g/dL (ref 3.5–5.0)
Alkaline Phosphatase: 74 U/L (ref 38–126)
Anion gap: 8 (ref 5–15)
BUN: 23 mg/dL — ABNORMAL HIGH (ref 6–20)
CO2: 25 mmol/L (ref 22–32)
Calcium: 9.6 mg/dL (ref 8.9–10.3)
Chloride: 106 mmol/L (ref 98–111)
Creatinine: 0.95 mg/dL (ref 0.61–1.24)
GFR, Estimated: >60 mL/min (ref >60)
Glucose, Bld: 100 mg/dL — ABNORMAL HIGH (ref 70–99)
Potassium: 3.7 mmol/L (ref 3.5–5.1)
Sodium: 139 mmol/L (ref 135–145)
Total Bilirubin: 0.4 mg/dL (ref 0.3–1.2)
Total Protein: 6.7 g/dL (ref 6.5–8.1)

## 2021-05-17 LAB — CBC WITH DIFFERENTIAL (CANCER CENTER ONLY)
Abs Immature Granulocytes: 0.03 10*3/uL (ref 0.00–0.07)
Basophils Absolute: 0 10*3/uL (ref 0.0–0.1)
Basophils Relative: 0 %
Eosinophils Absolute: 0.2 10*3/uL (ref 0.0–0.5)
Eosinophils Relative: 2 %
HCT: 39.9 % (ref 39.0–52.0)
Hemoglobin: 13.4 g/dL (ref 13.0–17.0)
Immature Granulocytes: 1 %
Lymphocytes Relative: 14 %
Lymphs Abs: 0.9 10*3/uL (ref 0.7–4.0)
MCH: 32 pg (ref 26.0–34.0)
MCHC: 33.6 g/dL (ref 30.0–36.0)
MCV: 95.2 fL (ref 80.0–100.0)
Monocytes Absolute: 0.7 10*3/uL (ref 0.1–1.0)
Monocytes Relative: 11 %
Neutro Abs: 4.5 10*3/uL (ref 1.7–7.7)
Neutrophils Relative %: 72 %
Platelet Count: 169 10*3/uL (ref 150–400)
RBC: 4.19 MIL/uL — ABNORMAL LOW (ref 4.22–5.81)
RDW: 13.5 % (ref 11.5–15.5)
WBC Count: 6.2 10*3/uL (ref 4.0–10.5)
nRBC: 0 % (ref 0.0–0.2)

## 2021-05-17 LAB — LACTATE DEHYDROGENASE: LDH: 173 U/L (ref 98–192)

## 2021-05-17 MED ORDER — PREDNISONE 10 MG PO TABS: 10.0000 mg | ORAL_TABLET | Freq: Every day | ORAL | 3 refills | Status: DC

## 2021-05-17 MED ORDER — SODIUM CHLORIDE 0.9 % IV SOLN: Freq: Once | INTRAVENOUS | Status: AC

## 2021-05-17 MED ORDER — SODIUM CHLORIDE 0.9 % IV SOLN
480.0000 mg | Freq: Once | INTRAVENOUS | Status: AC
  Administered 2021-05-17: 480 mg via INTRAVENOUS
  Filled 2021-05-17: qty 48
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
## 2021-05-17 NOTE — Patient Instructions (Signed)
[facility]  Discharge Instructions: Thank you for choosing [facility] to provide your oncology and hematology care.   If you have a lab appointment with the Cancer Center, please go directly to the Cancer Center and check in at the registration area.  Wear comfortable clothing and clothing appropriate for easy access to any Portacath or PICC line.   We strive to give you quality time with your provider. You may need to reschedule your appointment if you arrive late (15 or more minutes).  Arriving late affects you and other patients whose appointments are after yours.  Also, if you miss three or more appointments without notifying the office, you may be dismissed from the clinic at the provider's discretion.      For prescription refill requests, have your pharmacy contact our office and allow 72 hours for refills to be completed.    Today you received the following chemotherapy and/or immunotherapy agents:  Opdivo      To help prevent nausea and vomiting after your treatment, we encourage you to take your nausea medication as directed.  BELOW ARE SYMPTOMS THAT SHOULD BE REPORTED IMMEDIATELY: *FEVER GREATER THAN 100.4 F (38 C) OR HIGHER *CHILLS OR SWEATING *NAUSEA AND VOMITING THAT IS NOT CONTROLLED WITH YOUR NAUSEA MEDICATION *UNUSUAL SHORTNESS OF BREATH *UNUSUAL BRUISING OR BLEEDING *URINARY PROBLEMS (pain or burning when urinating, or frequent urination) *BOWEL PROBLEMS (unusual diarrhea, constipation, pain near the anus) TENDERNESS IN MOUTH AND THROAT WITH OR WITHOUT PRESENCE OF ULCERS (sore throat, sores in mouth, or a toothache) UNUSUAL RASH, SWELLING OR PAIN  UNUSUAL VAGINAL DISCHARGE OR ITCHING   Items with * indicate a potential emergency and should be followed up as soon as possible or go to the Emergency Department if any problems should occur.  Please show the CHEMOTHERAPY ALERT CARD or IMMUNOTHERAPY ALERT CARD at check-in to the  Emergency Department and triage nurse. Should you have questions after your visit or need to cancel or reschedule your appointment, please contact [facility]  [phone] and follow the prompts.  Office hours are 8:00 a.m. to 4:30 p.m. Monday - Friday. Please note that voicemails left after 4:00 p.m. may not be returned until the following business day.  We are closed weekends and major holidays. You have access to a nurse at all times for urgent questions. Please call the main number to the clinic [phone] and follow the prompts.  For any non-urgent questions, you may also contact your provider using MyChart. We now offer e-Visits for anyone 18 and older to request care online for non-urgent symptoms. For details visit [email or web address].   Also download the MyChart app! Go to the app store, search "MyChart", open the app, select [facility], and log in with your MyChart username and password.  Due to Covid, a mask is required upon entering the hospital/clinic. If you do not have a mask, one will be given to you upon arrival. For doctor visits, patients may have 1 support person aged 18 or older with them. For treatment visits, patients cannot have anyone with them due to current Covid guidelines and our immunocompromised population.  

## 2021-05-17 NOTE — Addendum Note (Signed)
Addended by: Burney Gauze R on: 05/17/2021 01:18 PM   Modules accepted: Orders

## 2021-05-18 LAB — IRON AND TIBC
Iron: 67 ug/dL (ref 45–182)
Saturation Ratios: 28 % (ref 17.9–39.5)
TIBC: 243 ug/dL — ABNORMAL LOW (ref 250–450)
UIBC: 176 ug/dL

## 2021-05-18 LAB — TSH: TSH: 3.7 u[IU]/mL (ref 0.350–4.500)

## 2021-05-18 LAB — FERRITIN: Ferritin: 720 ng/mL — ABNORMAL HIGH (ref 24–336)

## 2021-05-19 DIAGNOSIS — J329 Chronic sinusitis, unspecified: Secondary | ICD-10-CM | POA: Diagnosis not present

## 2021-05-19 DIAGNOSIS — Z20828 Contact with and (suspected) exposure to other viral communicable diseases: Secondary | ICD-10-CM | POA: Diagnosis not present

## 2021-05-19 DIAGNOSIS — J40 Bronchitis, not specified as acute or chronic: Secondary | ICD-10-CM | POA: Diagnosis not present

## 2021-06-07 ENCOUNTER — Telehealth: Payer: Self-pay | Admitting: *Deleted

## 2021-06-07 NOTE — Telephone Encounter (Signed)
Wife called and stated,"James Byrd has a Baker's cyst behind his knee. What should we do?" Per Dr. Marin Olp, he needs to see his Orthopedic doctor. She verbalized understanding.

## 2021-06-15 ENCOUNTER — Inpatient Hospital Stay: Payer: PPO

## 2021-06-15 ENCOUNTER — Other Ambulatory Visit: Payer: Self-pay

## 2021-06-15 ENCOUNTER — Other Ambulatory Visit: Payer: Self-pay | Admitting: Family

## 2021-06-15 ENCOUNTER — Encounter: Payer: Self-pay | Admitting: Hematology & Oncology

## 2021-06-15 ENCOUNTER — Encounter: Payer: Self-pay | Admitting: Family

## 2021-06-15 ENCOUNTER — Inpatient Hospital Stay: Payer: PPO | Attending: Hematology & Oncology

## 2021-06-15 ENCOUNTER — Inpatient Hospital Stay (HOSPITAL_BASED_OUTPATIENT_CLINIC_OR_DEPARTMENT_OTHER): Payer: PPO | Admitting: Hematology & Oncology

## 2021-06-15 ENCOUNTER — Telehealth: Payer: Self-pay | Admitting: Hematology & Oncology

## 2021-06-15 VITALS — BP 124/78 | HR 74 | Resp 18

## 2021-06-15 VITALS — BP 132/84 | HR 63 | Temp 98.0°F | Resp 18 | Wt 271.0 lb

## 2021-06-15 DIAGNOSIS — Z923 Personal history of irradiation: Secondary | ICD-10-CM | POA: Diagnosis not present

## 2021-06-15 DIAGNOSIS — C4371 Malignant melanoma of right lower limb, including hip: Secondary | ICD-10-CM | POA: Diagnosis present

## 2021-06-15 DIAGNOSIS — Z5111 Encounter for antineoplastic chemotherapy: Secondary | ICD-10-CM | POA: Insufficient documentation

## 2021-06-15 DIAGNOSIS — C779 Secondary and unspecified malignant neoplasm of lymph node, unspecified: Secondary | ICD-10-CM | POA: Insufficient documentation

## 2021-06-15 DIAGNOSIS — Z79899 Other long term (current) drug therapy: Secondary | ICD-10-CM | POA: Insufficient documentation

## 2021-06-15 DIAGNOSIS — D509 Iron deficiency anemia, unspecified: Secondary | ICD-10-CM | POA: Diagnosis not present

## 2021-06-15 DIAGNOSIS — N522 Drug-induced erectile dysfunction: Secondary | ICD-10-CM

## 2021-06-15 LAB — CMP (CANCER CENTER ONLY)
ALT: 22 U/L (ref 0–44)
AST: 16 U/L (ref 15–41)
Albumin: 4.3 g/dL (ref 3.5–5.0)
Alkaline Phosphatase: 69 U/L (ref 38–126)
Anion gap: 9 (ref 5–15)
BUN: 27 mg/dL — ABNORMAL HIGH (ref 6–20)
CO2: 24 mmol/L (ref 22–32)
Calcium: 9.7 mg/dL (ref 8.9–10.3)
Chloride: 104 mmol/L (ref 98–111)
Creatinine: 0.98 mg/dL (ref 0.61–1.24)
GFR, Estimated: >60 mL/min (ref >60)
Glucose, Bld: 112 mg/dL — ABNORMAL HIGH (ref 70–99)
Potassium: 4.5 mmol/L (ref 3.5–5.1)
Sodium: 137 mmol/L (ref 135–145)
Total Bilirubin: 0.3 mg/dL (ref 0.3–1.2)
Total Protein: 7.2 g/dL (ref 6.5–8.1)

## 2021-06-15 LAB — CBC WITH DIFFERENTIAL (CANCER CENTER ONLY)
Abs Immature Granulocytes: 0.05 10*3/uL (ref 0.00–0.07)
Basophils Absolute: 0 10*3/uL (ref 0.0–0.1)
Basophils Relative: 0 %
Eosinophils Absolute: 0.1 10*3/uL (ref 0.0–0.5)
Eosinophils Relative: 1 %
HCT: 42.3 % (ref 39.0–52.0)
Hemoglobin: 14.2 g/dL (ref 13.0–17.0)
Immature Granulocytes: 1 %
Lymphocytes Relative: 7 %
Lymphs Abs: 0.7 10*3/uL (ref 0.7–4.0)
MCH: 31.8 pg (ref 26.0–34.0)
MCHC: 33.6 g/dL (ref 30.0–36.0)
MCV: 94.8 fL (ref 80.0–100.0)
Monocytes Absolute: 0.5 10*3/uL (ref 0.1–1.0)
Monocytes Relative: 6 %
Neutro Abs: 7.6 10*3/uL (ref 1.7–7.7)
Neutrophils Relative %: 85 %
Platelet Count: 216 10*3/uL (ref 150–400)
RBC: 4.46 MIL/uL (ref 4.22–5.81)
RDW: 13.6 % (ref 11.5–15.5)
WBC Count: 8.9 10*3/uL (ref 4.0–10.5)
nRBC: 0 % (ref 0.0–0.2)

## 2021-06-15 LAB — LACTATE DEHYDROGENASE: LDH: 175 U/L (ref 98–192)

## 2021-06-15 MED ORDER — SILDENAFIL CITRATE 50 MG PO TABS
50.0000 mg | ORAL_TABLET | Freq: Every day | ORAL | 0 refills | Status: DC | PRN
Start: 2021-06-15 — End: 2022-01-20

## 2021-06-15 MED ORDER — SODIUM CHLORIDE 0.9 % IV SOLN
480.0000 mg | Freq: Once | INTRAVENOUS | Status: AC
  Administered 2021-06-15: 09:00:00 480 mg via INTRAVENOUS
  Filled 2021-06-15: qty 48

## 2021-06-15 MED ORDER — SODIUM CHLORIDE 0.9 % IV SOLN: Freq: Once | INTRAVENOUS | Status: AC

## 2021-06-15 NOTE — Telephone Encounter (Signed)
Scheduled appt per 12/27 los - patient is aware of appt date and time

## 2021-06-15 NOTE — Patient Instructions (Signed)
[facility]  Discharge Instructions: Thank you for choosing [facility] to provide your oncology and hematology care.   If you have a lab appointment with the [facility], please go directly to the [facility] and check in at the registration area.  Wear comfortable clothing and clothing appropriate for easy access to any Portacath or PICC line.   We strive to give you quality time with your provider. You may need to reschedule your appointment if you arrive late (15 or more minutes).  Arriving late affects you and other patients whose appointments are after yours.  Also, if you miss three or more appointments without notifying the office, you may be dismissed from the clinic at the providers discretion.      For prescription refill requests, have your pharmacy contact our office and allow 72 hours for refills to be completed.    Today you received the following chemotherapy and/or immunotherapy agents:  Nivolumab      To help prevent nausea and vomiting after your treatment, we encourage you to take your nausea medication as directed.  BELOW ARE SYMPTOMS THAT SHOULD BE REPORTED IMMEDIATELY: *FEVER GREATER THAN 100.4 F (38 C) OR HIGHER *CHILLS OR SWEATING *NAUSEA AND VOMITING THAT IS NOT CONTROLLED WITH YOUR NAUSEA MEDICATION *UNUSUAL SHORTNESS OF BREATH *UNUSUAL BRUISING OR BLEEDING *URINARY PROBLEMS (pain or burning when urinating, or frequent urination) *BOWEL PROBLEMS (unusual diarrhea, constipation, pain near the anus) TENDERNESS IN MOUTH AND THROAT WITH OR WITHOUT PRESENCE OF ULCERS (sore throat, sores in mouth, or a toothache) UNUSUAL RASH, SWELLING OR PAIN  UNUSUAL VAGINAL DISCHARGE OR ITCHING   Items with * indicate a potential emergency and should be followed up as soon as possible or go to the Emergency Department if any problems should occur.  Please show the CHEMOTHERAPY ALERT CARD or IMMUNOTHERAPY ALERT CARD at check-in to the  Emergency Department and triage nurse. Should you have questions after your visit or need to cancel or reschedule your appointment, please contact [facility]  [phone] and follow the prompts.  Office hours are 8:00 a.m. to 4:30 p.m. Monday - Friday. Please note that voicemails left after 4:00 p.m. may not be returned until the following business day.  We are closed weekends and major holidays. You have access to a nurse at all times for urgent questions. Please call the main number to the clinic [phone] and follow the prompts.  For any non-urgent questions, you may also contact your provider using MyChart. We now offer e-Visits for anyone 27 and older to request care online for non-urgent symptoms. For details visit [email or web address].   Also download the MyChart app! Go to the app store, search "MyChart", open the app, select [facility], and log in with your MyChart username and password.  Due to Covid, a mask is required upon entering the hospital/clinic. If you do not have a mask, one will be given to you upon arrival. For doctor visits, patients may have 1 support person aged 1 or older with them. For treatment visits, patients cannot have anyone with them due to current Covid guidelines and our immunocompromised population.

## 2021-06-15 NOTE — Progress Notes (Signed)
Hematology and Oncology Follow Up Visit  James Byrd [id number] 16-Nov-1965 55 y.o. 06/15/2021   Principle Diagnosis:  Stage IIIB (T3bN1aM0) subungual melanoma of the right hallux -- nodal recurrence in the RIGHT inguinal nodes -- BRAF wt RIGHT Common Iliac node recurrence Progression of melanoma-lymph node metastasis- 02/2021 Neuroendocrine carcinoma of the pancreas Iron def anemia   Past Therapy:        Yervoy-status post 3 cycles - discontinued in September 2016 secondary to toxicity Somatuline 120 mg IM q month -- started on 05/22/2019 - d/c on 08/2019 XRT to the RIGHT inguinal basin  Current Therapy:   Nivolumab 480 mg q month -- started on 03/19/2021, s/p cycle 3 IV Iron as indicated    Interim History:  James Byrd is here today for follow-up and treatment.  He has wife at a wonderful Christmas.  I saw the pictures of them with one of their grandchildren.  Also happy for him.  He has been having some problems with his knee.  We have back on low-dose steroids.  He has been off methotrexate.  He would like to get back onto methotrexate.  He has had no cough or shortness of breath.  He has had no nausea or vomiting.  There has been no rash.  He has had no change in bowel or bladder habits.  He has had no bleeding.  Currently, I would say his performance status is probably ECOG 1.      Medications:  Allergies as of 06/15/2021   No Known Allergies      Medication List        Accurate as of June 15, 2021  8:02 AM. If you have any questions, ask your nurse or doctor.          acetaminophen 650 MG CR tablet Commonly known as: TYLENOL Take 1,300 mg by mouth daily as needed for pain.   augmented betamethasone dipropionate 0.05 % cream Commonly known as: DIPROLENE-AF Apply 1 application topically 2 (two) times daily as needed (psoriasis).   cyanocobalamin 2000 MCG tablet Take 2,000 mcg by mouth daily. Vitamin b12   diclofenac sodium 1 % Gel Commonly known  as: VOLTAREN Apply 2 g topically daily as needed (pain).   DULoxetine 20 MG capsule Commonly known as: CYMBALTA Take 20 mg by mouth daily.   FISH OIL PO Take by mouth daily.   folic acid 1 MG tablet Commonly known as: FOLVITE TAKE TWO TABLETS BY MOUTH ONCE DAILY   gabapentin 600 MG tablet Commonly known as: NEURONTIN Take 1,200 mg by mouth 3 (three) times daily.   GLUCOSAMINE-CHONDROITIN PO Take 1 tablet by mouth 2 (two) times daily.   hydrocortisone 5 MG tablet Commonly known as: CORTEF TAKE ONE TABLET BY MOUTH THREE TIMES DAILY   Melatonin 10 MG Tabs Take 10 mg by mouth at bedtime as needed.   methotrexate (PF) 50 MG/2ML injection INJECT 0.66m SUBCUTANEOUSLY ONCE A WEEK   multivitamin with minerals Tabs tablet Take 1 tablet by mouth daily.   neomycin-polymyxin b-dexamethasone 3.5-10000-0.1 Susp Commonly known as: MAXITROL Place into the right eye.   ondansetron 4 MG disintegrating tablet Commonly known as: ZOFRAN-ODT Take 4 mg by mouth every 6 (six) hours as needed.   oxyCODONE-acetaminophen 5-325 MG tablet Commonly known as: PERCOCET/ROXICET TAKE ONE TABLET BY MOUTH EVERY 8 HOURS AS NEEDED FOR SEVERE PAIN.   predniSONE 10 MG tablet Commonly known as: DELTASONE Take 1 tablet (10 mg total) by mouth daily with breakfast.   promethazine-dextromethorphan 6.25-15  MG/5ML syrup Commonly known as: PROMETHAZINE-DM Take 10 mLs by mouth at bedtime.   pyridOXINE 100 MG tablet Commonly known as: VITAMIN B-6 Take 100 mg by mouth daily.   Testosterone 20.25 MG/ACT (1.62%) Gel Apply 20.25 mg topically See admin instructions. Apply 1 pump (20.25 mg) topically on each shoulder once daily   TUBERCULIN SYR 1CC/27GX1/2" 27G X 1/2" 1 ML Misc Commonly known as: B-D TB SYRINGE 1CC/27GX1/2" Use 1 syringe once weekly to inject methotrexate.   vitamin C 1000 MG tablet Take 1,000 mg by mouth daily.   Vitamin D 50 MCG (2000 UT) tablet Take 2,000 Units by mouth daily.         Allergies: No Known Allergies  Past Medical History, Surgical history, Social history, and Family History were reviewed and updated.  Review of Systems: Review of Systems  Constitutional: Negative.   HENT: Negative.    Eyes: Negative.   Respiratory: Negative.    Cardiovascular: Negative.   Gastrointestinal: Negative.   Genitourinary: Negative.   Musculoskeletal:  Positive for joint pain and myalgias.  Skin: Negative.   Neurological: Negative.   Endo/Heme/Allergies: Negative.   Psychiatric/Behavioral: Negative.     Physical Exam:  weight is 271 lb (122.9 kg). His oral temperature is 98 F (36.7 C). His blood pressure is 132/84 and his pulse is 63. His respiration is 18 and oxygen saturation is 95%.   Wt Readings from Last 3 Encounters:  06/15/21 271 lb (122.9 kg)  05/17/21 264 lb (119.7 kg)  04/16/21 249 lb (112.9 kg)    Physical Exam Vitals reviewed.  HENT:     Head: Normocephalic and atraumatic.  Eyes:     Pupils: Pupils are equal, round, and reactive to light.  Cardiovascular:     Rate and Rhythm: Normal rate and regular rhythm.     Heart sounds: Normal heart sounds.  Pulmonary:     Effort: Pulmonary effort is normal.     Breath sounds: Normal breath sounds.  Abdominal:     General: Bowel sounds are normal.     Palpations: Abdomen is soft.  Musculoskeletal:        General: No tenderness or deformity. Normal range of motion.     Cervical back: Normal range of motion.  Lymphadenopathy:     Cervical: No cervical adenopathy.  Skin:    General: Skin is warm and dry.     Findings: No erythema or rash.  Neurological:     Mental Status: He is alert and oriented to person, place, and time.  Psychiatric:        Behavior: Behavior normal.        Thought Content: Thought content normal.        Judgment: Judgment normal.     Lab Results  Component Value Date   WBC 8.9 06/15/2021   HGB 14.2 06/15/2021   HCT 42.3 06/15/2021   MCV 94.8 06/15/2021   PLT 216  06/15/2021   Lab Results  Component Value Date   FERRITIN 720 (H) 05/17/2021   IRON 67 05/17/2021   TIBC 243 (L) 05/17/2021   UIBC 176 05/17/2021   IRONPCTSAT 28 05/17/2021   Lab Results  Component Value Date   RETICCTPCT 1.6 11/27/2019   RBC 4.46 06/15/2021   No results found for: Nils Pyle [facility] Lab Results  Component Value Date   IGGSERUM 1,046 03/25/2020   IGMSERUM 64 03/25/2020   Lab Results  Component Value Date   ALBUMINELP 4.3 03/25/2020   A1GS 0.3 03/25/2020  A2GS 0.7 03/25/2020   BETS 0.4 03/25/2020   BETA2SER 0.3 03/25/2020   GAMS 0.9 03/25/2020   SPEI  03/25/2020     Comment:     Normal Serum Protein Electrophoresis Pattern. No abnormal protein bands (M-protein) detected.      Chemistry      Component Value Date/Time   NA 139 05/17/2021 1146   NA 138 06/04/2020 1003   NA 145 06/06/2017 1316   NA 141 10/16/2015 1334   K 3.7 05/17/2021 1146   K 4.1 06/06/2017 1316   K 3.9 10/16/2015 1334   CL 106 05/17/2021 1146   CL 108 06/06/2017 1316   CO2 25 05/17/2021 1146   CO2 26 06/06/2017 1316   CO2 21 (L) 10/16/2015 1334   BUN 23 (H) 05/17/2021 1146   BUN 17 06/04/2020 1003   BUN 19 06/06/2017 1316   BUN 20.0 10/16/2015 1334   CREATININE 0.95 05/17/2021 1146   CREATININE 1.0 06/06/2017 1316   CREATININE 1.0 10/16/2015 1334      Component Value Date/Time   CALCIUM 9.6 05/17/2021 1146   CALCIUM 9.4 06/06/2017 1316   CALCIUM 9.7 10/16/2015 1334   ALKPHOS 74 05/17/2021 1146   ALKPHOS 78 06/06/2017 1316   ALKPHOS 62 10/16/2015 1334   AST 16 05/17/2021 1146   AST 16 10/16/2015 1334   ALT 22 05/17/2021 1146   ALT 27 06/06/2017 1316   ALT 11 10/16/2015 1334   BILITOT 0.4 05/17/2021 1146   BILITOT 0.49 10/16/2015 1334       Impression and Plan: Mr. Keast is a very pleasant 55 yo caucasian gentleman with history of stage IIIB melanoma of the right hallux that was subungual with one microscopic positive inguinal lymph node. He  completed 3 cycles of Yervoy but stopped due to side effects.   He had a recurrence in the right inguinal area that was resected and he completed radiation therapy in March 2021.   He then had another recurrence treated with radiosurgery and has now started treatment again with Nivolumab.   We will go ahead with his 4th cycle of nivolumab.  I do think he is doing well with this.  I really do not think that the steroid dose that he is on is going to adversely affect the nivolumab.  I will go ahead and set him up with a PET scan.  We will do this probably in about 3 or 4 weeks.  We will then have to see about further therapy after the PET scan.  Hopefully he will be in remission.  I know he would like to get back onto the methotrexate.  I will have to see what count of interaction, if any there will be if we get him on methotrexate.   [name], MD 12/27/20228:02 AM

## 2021-06-22 ENCOUNTER — Telehealth: Payer: Self-pay | Admitting: *Deleted

## 2021-06-22 NOTE — Telephone Encounter (Signed)
Received call from Lawana Chambers patients wife who said that patient is in [place] ED ready to be admitted for human metapneumovirus as well as dehydration and nausea and vomiting.  Told patient to keep [place] posted and gave best wishes to [place] as he recovers.

## 2021-06-24 ENCOUNTER — Telehealth: Payer: Self-pay | Admitting: *Deleted

## 2021-06-24 DIAGNOSIS — I517 Cardiomegaly: Secondary | ICD-10-CM

## 2021-06-24 NOTE — Telephone Encounter (Signed)
Call received from patient's wife, Hoyle Sauer to inform Dr. Marin Olp that pt will be discharged today to home with oxygen.  Dr. Marin Olp notified.

## 2021-07-14 ENCOUNTER — Other Ambulatory Visit: Payer: Self-pay

## 2021-07-14 ENCOUNTER — Encounter (HOSPITAL_COMMUNITY)
Admission: RE | Admit: 2021-07-14 | Discharge: 2021-07-14 | Disposition: A | Discharge status: Home / Self Care | Payer: PPO | Source: Ambulatory Visit | Attending: Hematology & Oncology | Admitting: Hematology & Oncology

## 2021-07-14 DIAGNOSIS — C4371 Malignant melanoma of right lower limb, including hip: Secondary | ICD-10-CM | POA: Diagnosis present

## 2021-07-14 LAB — GLUCOSE, CAPILLARY: Glucose-Capillary: 97 mg/dL (ref 70–99)

## 2021-07-14 MED ORDER — FLUDEOXYGLUCOSE F - 18 (FDG) INJECTION
13.5000 | Freq: Once | INTRAVENOUS | Status: AC
  Administered 2021-07-14: 08:00:00 13.26 via INTRAVENOUS

## 2021-07-15 ENCOUNTER — Other Ambulatory Visit: Payer: Self-pay | Admitting: Hematology & Oncology

## 2021-07-15 DIAGNOSIS — C4371 Malignant melanoma of right lower limb, including hip: Secondary | ICD-10-CM

## 2021-07-16 ENCOUNTER — Other Ambulatory Visit: Payer: Self-pay

## 2021-07-16 ENCOUNTER — Telehealth: Payer: Self-pay | Admitting: *Deleted

## 2021-07-16 ENCOUNTER — Emergency Department (HOSPITAL_COMMUNITY): Payer: PPO

## 2021-07-16 ENCOUNTER — Encounter (HOSPITAL_COMMUNITY): Payer: Self-pay | Admitting: Emergency Medicine

## 2021-07-16 ENCOUNTER — Encounter (HOSPITAL_COMMUNITY): Payer: Self-pay

## 2021-07-16 ENCOUNTER — Emergency Department (HOSPITAL_COMMUNITY)
Admission: EM | Admit: 2021-07-16 | Discharge: 2021-07-16 | Disposition: A | Discharge status: Home / Self Care | Payer: PPO | Attending: Emergency Medicine | Admitting: Emergency Medicine

## 2021-07-16 DIAGNOSIS — Z7901 Long term (current) use of anticoagulants: Secondary | ICD-10-CM | POA: Insufficient documentation

## 2021-07-16 DIAGNOSIS — R112 Nausea with vomiting, unspecified: Secondary | ICD-10-CM

## 2021-07-16 DIAGNOSIS — Z79899 Other long term (current) drug therapy: Secondary | ICD-10-CM | POA: Diagnosis not present

## 2021-07-16 DIAGNOSIS — U071 COVID-19: Secondary | ICD-10-CM | POA: Diagnosis not present

## 2021-07-16 DIAGNOSIS — Z7952 Long term (current) use of systemic steroids: Secondary | ICD-10-CM | POA: Insufficient documentation

## 2021-07-16 DIAGNOSIS — Z85820 Personal history of malignant melanoma of skin: Secondary | ICD-10-CM | POA: Insufficient documentation

## 2021-07-16 LAB — CBC WITH DIFFERENTIAL/PLATELET
Abs Immature Granulocytes: 0.02 10*3/uL (ref 0.00–0.07)
Basophils Absolute: 0 10*3/uL (ref 0.0–0.1)
Basophils Relative: 0 %
Eosinophils Absolute: 0.1 10*3/uL (ref 0.0–0.5)
Eosinophils Relative: 1 %
HCT: 38.6 % — ABNORMAL LOW (ref 39.0–52.0)
Hemoglobin: 12.7 g/dL — ABNORMAL LOW (ref 13.0–17.0)
Immature Granulocytes: 0 %
Lymphocytes Relative: 8 %
Lymphs Abs: 0.4 10*3/uL — ABNORMAL LOW (ref 0.7–4.0)
MCH: 31.1 pg (ref 26.0–34.0)
MCHC: 32.9 g/dL (ref 30.0–36.0)
MCV: 94.4 fL (ref 80.0–100.0)
Monocytes Absolute: 0.4 10*3/uL (ref 0.1–1.0)
Monocytes Relative: 7 %
Neutro Abs: 4 10*3/uL (ref 1.7–7.7)
Neutrophils Relative %: 84 %
Platelets: 164 10*3/uL (ref 150–400)
RBC: 4.09 MIL/uL — ABNORMAL LOW (ref 4.22–5.81)
RDW: 13.7 % (ref 11.5–15.5)
WBC: 4.9 10*3/uL (ref 4.0–10.5)
nRBC: 0 % (ref 0.0–0.2)

## 2021-07-16 LAB — URINALYSIS, ROUTINE W REFLEX MICROSCOPIC
Bilirubin Urine: NEGATIVE
Glucose, UA: NEGATIVE mg/dL
Hgb urine dipstick: NEGATIVE
Ketones, ur: NEGATIVE mg/dL
Leukocytes,Ua: NEGATIVE
Nitrite: NEGATIVE
Protein, ur: NEGATIVE mg/dL
Specific Gravity, Urine: 1.019 (ref 1.005–1.030)
pH: 5 (ref 5.0–8.0)

## 2021-07-16 LAB — COMPREHENSIVE METABOLIC PANEL
ALT: 22 U/L (ref 0–44)
AST: 19 U/L (ref 15–41)
Albumin: 4.1 g/dL (ref 3.5–5.0)
Alkaline Phosphatase: 63 U/L (ref 38–126)
Anion gap: 8 (ref 5–15)
BUN: 18 mg/dL (ref 6–20)
CO2: 27 mmol/L (ref 22–32)
Calcium: 8.7 mg/dL — ABNORMAL LOW (ref 8.9–10.3)
Chloride: 102 mmol/L (ref 98–111)
Creatinine, Ser: 1.13 mg/dL (ref 0.61–1.24)
GFR, Estimated: >60 mL/min (ref >60)
Glucose, Bld: 91 mg/dL (ref 70–99)
Potassium: 3.6 mmol/L (ref 3.5–5.1)
Sodium: 137 mmol/L (ref 135–145)
Total Bilirubin: 0.6 mg/dL (ref 0.3–1.2)
Total Protein: 7.6 g/dL (ref 6.5–8.1)

## 2021-07-16 LAB — RESP PANEL BY RT-PCR (FLU A&B, COVID) ARPGX2
Influenza A by PCR: NEGATIVE
Influenza B by PCR: NEGATIVE
SARS Coronavirus 2 by RT PCR: POSITIVE — AB

## 2021-07-16 LAB — LIPASE, BLOOD: Lipase: 28 U/L (ref 11–51)

## 2021-07-16 MED ORDER — NIRMATRELVIR/RITONAVIR (PAXLOVID)TABLET: 3.0000 | ORAL_TABLET | Freq: Two times a day (BID) | ORAL | 0 refills | Status: DC

## 2021-07-16 MED ORDER — NIRMATRELVIR/RITONAVIR (PAXLOVID)TABLET: 3.0000 | ORAL_TABLET | Freq: Two times a day (BID) | ORAL | 0 refills | Status: AC

## 2021-07-16 MED ORDER — ONDANSETRON 8 MG PO TBDP: 8.0000 mg | ORAL_TABLET | Freq: Three times a day (TID) | ORAL | 0 refills | Status: DC | PRN

## 2021-07-16 MED ORDER — ONDANSETRON HCL 4 MG/2ML IJ SOLN
4.0000 mg | Freq: Once | INTRAMUSCULAR | Status: AC
  Administered 2021-07-16: 4 mg via INTRAVENOUS
  Filled 2021-07-16: qty 2

## 2021-07-16 MED ORDER — KETOROLAC TROMETHAMINE 30 MG/ML IJ SOLN
15.0000 mg | Freq: Once | INTRAMUSCULAR | Status: AC
  Administered 2021-07-16: 15 mg via INTRAVENOUS
  Filled 2021-07-16: qty 1

## 2021-07-16 MED ORDER — SODIUM CHLORIDE 0.9 % IV BOLUS
1000.0000 mL | Freq: Once | INTRAVENOUS | Status: AC
  Administered 2021-07-16: 1000 mL via INTRAVENOUS

## 2021-07-16 NOTE — Progress Notes (Signed)
Patient Name  James, Byrd Legal Sex  Male DOB  1965/10/22 SSN  [id number] Address  [place] [address] Phone  [phone] [facility])  [phone] (Mobile)    RE: US BIOPSY (ABDOMINAL RETROPERTIONEAL MASS) Received: Today Arne Cleveland, MD  Valli Glance [place]   CT core R ext iliac LAN   DDH        Previous Messages   ----- Message -----  From: Valli Glance  Sent: 07/15/2021   4:33 PM EST  To: [facility] Procedure Requests  Subject: US BIOPSY (ABDOMINAL RETROPERTIONEAL MASS)     US BIOPSY (ABDOMINAL RETROPERTIONEAL MASS)        Reason: Recurrent melanoma.Large right external iliac node.         History: PET in Computer        Provider: [name]       [place]

## 2021-07-16 NOTE — ED Provider Notes (Signed)
[facility] DEPT Provider Note   CSN: [id number] Arrival date & time: 07/16/21  1222     History  Chief Complaint  Patient presents with   Emesis   Covid Positive    James Byrd is a 56 y.o. male.  He has a past medical history of hyperlipidemia, malignant melanoma with spread to lymph nodes and abdomen, pulmonary embolism not on anticoagulation. Patient presents emergency department after being diagnosed with COVID-19 yesterday afternoon.  Patient has been having body aches, fatigue, fevers, cough, and nasal congestion at home.  He was sent over here by his oncologist because in the past he has had COVID-19 and has received infusions for this.  He apparently recently had a PET scan as well that showed worsening lymphadenopathy and spread of his cancer.  Patient complains of some mild abdominal discomfort in his lower pelvic area, however this is not completely abnormal for him given his new abdominal mets.  He is apparently has been having associated nausea and vomiting since yesterday afternoon due to an increased gag reflex from upper respiratory symptoms.  Per wife and patient, he has been unable to tolerate p.o. intake because of this.  He denies any shortness of breath, chest pain, diarrhea, leg swelling.   Emesis Associated symptoms: abdominal pain, cough and fever   Associated symptoms: no diarrhea and no sore throat       Home Medications Prior to Admission medications   Medication Sig Start Date End Date Taking? Authorizing Provider  acetaminophen (TYLENOL) 650 MG CR tablet Take 1,300 mg by mouth daily as needed for pain.    [provider]  Ascorbic Acid (VITAMIN C) 1000 MG tablet Take 1,000 mg by mouth daily.    [provider]  augmented betamethasone dipropionate (DIPROLENE-AF) 0.05 % cream Apply 1 application topically 2 (two) times daily as needed (psoriasis).  09/29/15   [provider]  Cholecalciferol  (VITAMIN D) 2000 units tablet Take 2,000 Units by mouth daily.    [provider]  cyanocobalamin 2000 MCG tablet Take 2,000 mcg by mouth daily. Vitamin b12    [provider]  diclofenac sodium (VOLTAREN) 1 % GEL Apply 2 g topically daily as needed (pain).  10/23/18   [provider]  DULoxetine (CYMBALTA) 20 MG capsule Take 20 mg by mouth daily. 09/18/19   [provider]  folic acid (FOLVITE) 1 MG tablet TAKE TWO TABLETS BY MOUTH ONCE DAILY 02/01/21   Bo Merino, MD  gabapentin (NEURONTIN) 600 MG tablet Take 1,200 mg by mouth 3 (three) times daily. 02/22/21   [provider]  GLUCOSAMINE-CHONDROITIN PO Take 1 tablet by mouth 2 (two) times daily.     [provider]  hydrocortisone (CORTEF) 5 MG tablet TAKE ONE TABLET BY MOUTH THREE TIMES DAILY 05/15/21   [name], MD  Melatonin 10 MG TABS Take 10 mg by mouth at bedtime as needed.    [provider]  Methotrexate Sodium (METHOTREXATE, PF,) 50 MG/2ML injection INJECT 0.69ml SUBCUTANEOUSLY ONCE A WEEK Patient not taking: No sig reported 12/14/20   Bo Merino, MD  Multiple Vitamin (MULTIVITAMIN WITH MINERALS) TABS tablet Take 1 tablet by mouth daily.    [provider]  nirmatrelvir/ritonavir EUA (PAXLOVID) 20 x 150 MG & 10 x 100MG  TABS Take 3 tablets by mouth 2 (two) times daily for 5 days. Patient GFR is >60. Take nirmatrelvir (150 mg) two tablets twice daily for 5 days and ritonavir (100 mg) one tablet  twice daily for 5 days. 07/16/21 07/21/21  Marcena Dias, Adora Fridge, PA-C  Omega-3 Fatty Acids (FISH OIL PO) Take by mouth daily.    [provider]  ondansetron (ZOFRAN-ODT) 8 MG disintegrating tablet Take 1 tablet (8 mg total) by mouth every 8 (eight) hours as needed for nausea or vomiting. 07/16/21   Arav Bannister, Adora Fridge, PA-C  oxyCODONE-acetaminophen (PERCOCET/ROXICET) 5-325 MG tablet TAKE ONE TABLET BY MOUTH EVERY 8 HOURS AS NEEDED FOR SEVERE PAIN. 03/29/21    [name], MD  predniSONE (DELTASONE) 10 MG tablet Take 1 tablet (10 mg total) by mouth daily with breakfast. 05/17/21   [name], MD  promethazine-dextromethorphan (PROMETHAZINE-DM) 6.25-15 MG/5ML syrup Take 10 mLs by mouth at bedtime. 03/12/21   [provider]  pyridOXINE (VITAMIN B-6) 100 MG tablet Take 100 mg by mouth daily.    [provider]  sildenafil (VIAGRA) 50 MG tablet Take 1 tablet (50 mg total) by mouth daily as needed for erectile dysfunction. 06/15/21   Celso Amy, NP      Allergies    Patient has no known allergies.    Review of Systems   Review of Systems  Constitutional:  Positive for fatigue and fever.  HENT:  Positive for congestion. Negative for ear pain, rhinorrhea and sore throat.   Respiratory:  Positive for cough. Negative for shortness of breath.   Cardiovascular:  Negative for chest pain and leg swelling.  Gastrointestinal:  Positive for abdominal pain, nausea and vomiting. Negative for blood in stool, constipation and diarrhea.  All other systems reviewed and are negative.  Physical Exam Updated Vital Signs BP (!) 143/57    Pulse 98    Temp 99.4 F (37.4 C) (Oral)    Resp 16    SpO2 93%  Physical Exam Vitals and nursing note reviewed.  Constitutional:      General: He is not in acute distress.    Appearance: Normal appearance. He is not ill-appearing, toxic-appearing or diaphoretic.  HENT:     Head: Normocephalic and atraumatic.     Nose: Congestion present. No nasal deformity.     Mouth/Throat:     Lips: Pink. No lesions.     Mouth: Mucous membranes are moist. No injury, lacerations, oral lesions or angioedema.     Pharynx: Oropharynx is clear. Uvula midline. No pharyngeal swelling, oropharyngeal exudate, posterior oropharyngeal erythema or uvula swelling.  Eyes:     General: Gaze aligned appropriately. No scleral icterus.       Right eye: No discharge.        Left eye: No discharge.     Conjunctiva/sclera:  Conjunctivae normal.     Right eye: Right conjunctiva is not injected. No exudate or hemorrhage.    Left eye: Left conjunctiva is not injected. No exudate or hemorrhage. Cardiovascular:     Rate and Rhythm: Normal rate and regular rhythm.     Pulses: Normal pulses.          Radial pulses are 2+ on the right side and 2+ on the left side.       Dorsalis pedis pulses are 2+ on the right side and 2+ on the left side.     Heart sounds: Normal heart sounds, S1 normal and S2 normal. Heart sounds not distant. No murmur heard.   No friction rub. No gallop. No S3 or S4 sounds.  Pulmonary:     Effort: Pulmonary effort is normal. No accessory muscle usage or respiratory distress.  Breath sounds: Normal breath sounds. No stridor. No wheezing, rhonchi or rales.     Comments: Lungs CTA bilaterally. No accessory muscle use or tachypnea. Normal effort. Equal chest rise bilaterally Chest:     Chest wall: No tenderness.  Abdominal:     General: Abdomen is flat. Bowel sounds are normal. There is no distension.     Palpations: Abdomen is soft. There is no mass or pulsatile mass.     Tenderness: There is abdominal tenderness. There is no guarding or rebound.     Comments: Mild suprapubic ttp  Musculoskeletal:     Right lower leg: No edema.     Left lower leg: No edema.  Skin:    General: Skin is warm and dry.     Coloration: Skin is not jaundiced or pale.     Findings: No bruising, erythema, lesion or rash.  Neurological:     General: No focal deficit present.     Mental Status: He is alert and oriented to person, place, and time.     GCS: GCS eye subscore is 4. GCS verbal subscore is 5. GCS motor subscore is 6.  Psychiatric:        Mood and Affect: Mood normal.        Behavior: Behavior normal. Behavior is cooperative.    ED Results / Procedures / Treatments   Labs (all labs ordered are listed, but only abnormal results are displayed) Labs Reviewed  RESP PANEL BY RT-PCR (FLU A&B, COVID) [id number]  - Abnormal; Notable for the following components:      Result Value   SARS Coronavirus 2 by RT PCR POSITIVE (*)    All other components within normal limits  CBC WITH DIFFERENTIAL/PLATELET - Abnormal; Notable for the following components:   RBC 4.09 (*)    Hemoglobin 12.7 (*)    HCT 38.6 (*)    Lymphs Abs 0.4 (*)    All other components within normal limits  COMPREHENSIVE METABOLIC PANEL - Abnormal; Notable for the following components:   Calcium 8.7 (*)    All other components within normal limits  LIPASE, BLOOD  URINALYSIS, ROUTINE W REFLEX MICROSCOPIC    EKG None  Radiology DG Chest 2 View  Result Date: 07/16/2021 CLINICAL DATA:  Nausea, vomiting, cough, COVID positive EXAM: CHEST - 2 VIEW COMPARISON:  Previous studies including the examination of 06/24/2021 FINDINGS: Cardiac size is within normal limits. Thoracic aorta is tortuous. There are no signs of alveolar pulmonary edema. There are linear densities in both lower lung fields with interval increase. There is no pleural effusion or pneumothorax. IMPRESSION: Transverse linear densities seen in both lower lung fields suggesting subsegmental atelectasis. There are no signs of pulmonary edema or focal pulmonary consolidation. Electronically Signed   By: Elmer Picker M.D.   On: 07/16/2021 13:50    Procedures Procedures   Medications Ordered in ED Medications  ondansetron (ZOFRAN) injection 4 mg (4 mg Intravenous Given 07/16/21 1414)  sodium chloride 0.9 % bolus 1,000 mL (0 mLs Intravenous Stopped 07/16/21 1644)  ketorolac (TORADOL) 30 MG/ML injection 15 mg (15 mg Intravenous Given 07/16/21 1414)    ED Course/ Medical Decision Making/ A&P Clinical Course as of 07/16/21 1706  Fri Jul 16, 2021  1353 DG Chest 2 View Transverse linear densities seen in both lower lung fields suggesting subsegmental atelectasis. There are no signs of pulmonary edema or focal pulmonary consolidation   [GL]    Clinical Course User  Index [GL] Britne Borelli, Adora Fridge, PA-C  Medical Decision Making Amount and/or Complexity of Data Reviewed Independent Historian: spouse    Details: Independent historian presenting with wife External Data Reviewed: labs, radiology and notes.    Details: Reviewed recent oncology visit notes, telephone notes, previous kidney function, and recent PET scan results Labs: ordered. Decision-making details documented in ED Course. Radiology: ordered and independent interpretation performed. Decision-making details documented in ED Course.  Risk Prescription drug management.   This is a 56 y.o. male with a PMH of hyperlipidemia, malignant melanoma with spread to lymph nodes and abdomen, pulmonary embolism not on anticoagulation who presents to the ED with URI sx with associated n/v.   Initial Impression: Vitals reassuring. Patient presents with positive COVID diagnosis since yesterday.  He has associated nausea and vomiting with decreased p.o. intake.  He does have some chronic abdominal symptoms, however given nausea and vomiting, will order abdominal work-up. Abdominal exam is overall reassuring.  I have low suspicion for intra-abdominal etiology, suspect that nausea and vomiting is more due to gag reflex from cough and nasal congestion.  His lungs are clear, however given history of cancer, will obtain chest x-ray to evaluate for pneumonia.  With decreased p.o. intake, will get other labs and look for signs of dehydration, infection, or electrolyte abnormalities. Plan to provide patient with IV fluids, Toradol for body aches, and IV Zofran.  We will plan to p.o. challenge.   Patient is a good candidate for Paxlovid, however he has been unable to tolerate PO intake so he may not be able to take this.   I personally reviewed all laboratory work and imaging. Abnormal results outlined below. CBC with no leukocytosis.  There is anemia with hemoglobin of 12.7, however fairly stable  from baseline.  CMP with no electrolyte abnormalities, normal kidney function, LFTs normal.  Lipase negative.  UA without signs of dehydration, or infection.  CXR shows some suggestion of subsegmental atelectasis, however no signs of pulmonary edema, or focal consolidations indicating pneumonia. Overall labs and imaging are reassuring  On reassessment, patient's symptoms are improved.  He was able to tolerate p.o. intake without vomiting.  His nausea and body aches have improved.  I feel patient should be stable for discharge home.  Will provide antiemetic for nausea relief, and prescription for Paxlovid given hx of cancer and high risk. Reviewed medications with Paxlovid and patient is to not take Viagra and consider taking lower doses of Percocets. Kidney function is normal so he can tolerate the regular dosing.  Dispo Plan: Paxlovid, Antiemetics, Encourage hydration. Return precautions provided.  Portions of this note were generated with [employer]. Dictation errors may occur despite best attempts at proofreading.    Final Clinical Impression(s) / ED Diagnoses Final diagnoses:  COVID-19  Nausea and vomiting, unspecified vomiting type    Rx / DC Orders ED Discharge Orders          Ordered    nirmatrelvir/ritonavir EUA (PAXLOVID) 20 x 150 MG & 10 x 100MG  TABS  2 times daily,   Status:  Discontinued        07/16/21 1609    ondansetron (ZOFRAN-ODT) 8 MG disintegrating tablet  Every 8 hours PRN,   Status:  Discontinued        07/16/21 1609    nirmatrelvir/ritonavir EUA (PAXLOVID) 20 x 150 MG & 10 x 100MG  TABS  2 times daily        07/16/21 1634    ondansetron (ZOFRAN-ODT) 8 MG disintegrating tablet  Every 8 hours PRN  07/16/21 Weed, Zaydee Aina C, PA-C 07/16/21 1706    Daleen Bo, MD 07/16/21 1921

## 2021-07-16 NOTE — Telephone Encounter (Signed)
Call received from patient's wife stating that pt tested positive for Covid-19 this morning and that she is taking pt to the [facility] ER.  Dr. Marin Olp notified.

## 2021-07-16 NOTE — Discharge Instructions (Signed)
I have prescribed you 5 day course of Paxlovid which is an antiviral for COVID-19.  While taking Paxlovid, the antiviral for COVID-19, please do not take your Viagra. I also caution the use of your Percocet as well and consider taking half of your normal dose while takign this medication as Paxlovid may increase the concentration of Oxycodone.    You can also treat your fevers and body aches by taking 500 mg Tylenol every 4-6 hours until symptoms start to improve. I have also prescribed zofran for your nausea that can dissolve underneath the tongue. You can take this 30 min prior to taking other medications to limit the likelihood that you will throw up your medications.   If you develop worsening or concerning symptoms such as worsening shortness of breath, please return to the ED for further evaluation.

## 2021-07-16 NOTE — ED Triage Notes (Signed)
Pt c/o N/V beginning yesterday. Pt tested + for COVID yesterday, with coughing, chills, aches, HA. Unable to keep fluids or food down.

## 2021-07-17 ENCOUNTER — Encounter: Payer: Self-pay | Admitting: Hematology & Oncology

## 2021-07-19 ENCOUNTER — Encounter: Payer: Self-pay | Admitting: Hematology & Oncology

## 2021-07-21 ENCOUNTER — Inpatient Hospital Stay: Payer: PPO

## 2021-07-21 ENCOUNTER — Inpatient Hospital Stay: Payer: PPO | Admitting: Hematology & Oncology

## 2021-07-22 ENCOUNTER — Other Ambulatory Visit: Payer: Self-pay | Admitting: Radiology

## 2021-07-26 ENCOUNTER — Ambulatory Visit (HOSPITAL_COMMUNITY)
Admission: RE | Admit: 2021-07-26 | Discharge: 2021-07-26 | Disposition: A | Discharge status: Home / Self Care | Payer: PPO | Source: Ambulatory Visit | Attending: Hematology & Oncology | Admitting: Hematology & Oncology

## 2021-07-26 ENCOUNTER — Other Ambulatory Visit: Payer: Self-pay

## 2021-07-26 DIAGNOSIS — Z923 Personal history of irradiation: Secondary | ICD-10-CM | POA: Insufficient documentation

## 2021-07-26 DIAGNOSIS — Z8616 Personal history of COVID-19: Secondary | ICD-10-CM | POA: Insufficient documentation

## 2021-07-26 DIAGNOSIS — C7A8 Other malignant neuroendocrine tumors: Secondary | ICD-10-CM | POA: Insufficient documentation

## 2021-07-26 DIAGNOSIS — C4371 Malignant melanoma of right lower limb, including hip: Secondary | ICD-10-CM | POA: Insufficient documentation

## 2021-07-26 DIAGNOSIS — Z9221 Personal history of antineoplastic chemotherapy: Secondary | ICD-10-CM | POA: Insufficient documentation

## 2021-07-26 DIAGNOSIS — D509 Iron deficiency anemia, unspecified: Secondary | ICD-10-CM | POA: Diagnosis not present

## 2021-07-26 DIAGNOSIS — C775 Secondary and unspecified malignant neoplasm of intrapelvic lymph nodes: Secondary | ICD-10-CM | POA: Diagnosis not present

## 2021-07-26 DIAGNOSIS — Z79899 Other long term (current) drug therapy: Secondary | ICD-10-CM | POA: Insufficient documentation

## 2021-07-26 LAB — PROTIME-INR
INR: 1.1 (ref 0.8–1.2)
Prothrombin Time: 13.7 seconds (ref 11.4–15.2)

## 2021-07-26 LAB — CBC
HCT: 38.5 % — ABNORMAL LOW (ref 39.0–52.0)
Hemoglobin: 13.4 g/dL (ref 13.0–17.0)
MCH: 31.5 pg (ref 26.0–34.0)
MCHC: 34.8 g/dL (ref 30.0–36.0)
MCV: 90.6 fL (ref 80.0–100.0)
Platelets: 255 10*3/uL (ref 150–400)
RBC: 4.25 MIL/uL (ref 4.22–5.81)
RDW: 13.4 % (ref 11.5–15.5)
WBC: 8 10*3/uL (ref 4.0–10.5)
nRBC: 0 % (ref 0.0–0.2)

## 2021-07-26 MED ORDER — LIDOCAINE HCL 1 % IJ SOLN
INTRAMUSCULAR | Status: AC
  Administered 2021-07-26: 10 mL via INTRADERMAL
  Filled 2021-07-26: qty 10

## 2021-07-26 MED ORDER — SODIUM CHLORIDE 0.9 % IV SOLN: INTRAVENOUS | Status: DC

## 2021-07-26 MED ORDER — FENTANYL CITRATE (PF) 100 MCG/2ML IJ SOLN
INTRAMUSCULAR | Status: AC
  Filled 2021-07-26: qty 4

## 2021-07-26 MED ORDER — MIDAZOLAM HCL 2 MG/2ML IJ SOLN
INTRAMUSCULAR | Status: AC | PRN
  Administered 2021-07-26 (×2): 1 mg via INTRAVENOUS

## 2021-07-26 MED ORDER — FENTANYL CITRATE (PF) 100 MCG/2ML IJ SOLN
INTRAMUSCULAR | Status: AC | PRN
  Administered 2021-07-26 (×2): 50 ug via INTRAVENOUS

## 2021-07-26 MED ORDER — MIDAZOLAM HCL 2 MG/2ML IJ SOLN
INTRAMUSCULAR | Status: AC
  Filled 2021-07-26: qty 4

## 2021-07-26 NOTE — H&P (Signed)
Chief Complaint: Patient was seen in consultation today for large right external iliac node at the request of Ennever,Peter R  Referring Physician(s): Ennever,Peter R  Supervising Physician: Jacqulynn Cadet  Patient Status: [facility] - Out-pt  History of Present Illness: James Byrd is a 56 y.o. 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  He is s/p resection and has had another recurrence, neuroendocrine carcinoma of the pancreas, and iron deficiency anemia.  He has undergone chemotherapy nd radiation therapies.  He is currently being treated with Nivolumab and iron infusions.  Recent PET states  "generally worsened, with substantial increase in size and activity of abdominal and pelvic lymph nodes"  Last chemotherapy 4 weeks ago at this point +Covid 07/16/21 Interval PET 07/14/21: IMPRESSION: 1. Generally worsened, with substantial increase in size and activity of abdominal and pelvic lymph nodes. 2. The small nodule along the lateral left gluteus muscle is reduced in size and activity. 3. Accentuated synovial activity along the large joints, suggesting arthritis.  Request made per Dr Marin Olp for LN biopsy Dr. Vernard Gambles reviewed the case and has approved a right external iliac lymph node biopsy via CT.  Past Medical History:  Diagnosis Date   Adrenal insufficiency ([place])    Anxiety 87/10/6431   Complication of anesthesia    SLO TO AWAKEN MANY 8 TO 9 YRS AGO, NO ISSUES SINCE   Goals of care, counseling/discussion 12/17/2018   Gout    History of kidney stones    HLD (hyperlipidemia)    Malignant melanoma of right great toe ([place]) 11/14/2014   Neuroendocrine carcinoma of pancreas ([place]) 05/22/2019   Paroxysmal supraventricular tachycardia ([place]) 05/24/2012   Described in the treadmill report; details are pending    PE (pulmonary thromboembolism) ([place]) 08/2019   Rheumatoid arthritis ([place])    Sleep  apnea    MILD NO CPAP NEEDED    Past Surgical History:  Procedure Laterality Date   CHOLECYSTECTOMY     ESOPHAGOGASTRODUODENOSCOPY (EGD) WITH PROPOFOL N/A 05/13/2019   Procedure: ESOPHAGOGASTRODUODENOSCOPY (EGD) WITH PROPOFOL;  Surgeon: Arta Silence, MD;  Location: [facility] ENDOSCOPY;  Service: Endoscopy;  Laterality: N/A;   EXTRACORPOREAL SHOCK WAVE LITHOTRIPSY  2016   FINE NEEDLE ASPIRATION N/A 05/13/2019   Procedure: FINE NEEDLE ASPIRATION (FNA) LINEAR;  Surgeon: Arta Silence, MD;  Location: [facility] ENDOSCOPY;  Service: Endoscopy;  Laterality: N/A;   LYMPH NODE BIOPSY Right 11/06/2018   Procedure: RIGHT INGUINAL LYMPH NODE BIOPSY AND POSSIBLE RIGHT GROIN NODE DISECTION;  Surgeon: Alphonsa Overall, MD;  Location: [facility] ORS;  Service: General;  Laterality: Right;   r toe partial ambutation     ROTATOR CUFF REPAIR Right 2010   3 yrs ago   UPPER ESOPHAGEAL ENDOSCOPIC ULTRASOUND (EUS) N/A 05/13/2019   Procedure: UPPER ESOPHAGEAL ENDOSCOPIC ULTRASOUND (EUS);  Surgeon: Arta Silence, MD;  Location: [facility];  Service: Endoscopy;  Laterality: N/A;    Allergies: Patient has no known allergies.  Medications: Prior to Admission medications   Medication Sig Start Date End Date Taking? Authorizing Provider  acetaminophen (TYLENOL) 650 MG CR tablet Take 1,300 mg by mouth daily as needed for pain.   Yes [provider]  Ascorbic Acid (VITAMIN C) 1000 MG tablet Take 1,000 mg by mouth daily.   Yes [provider]  augmented betamethasone dipropionate (DIPROLENE-AF) 0.05 % cream Apply 1 application topically 2 (two) times daily as needed (psoriasis).  09/29/15  Yes [provider]  Cholecalciferol (VITAMIN  D) 2000 units tablet Take 2,000 Units by mouth daily.   Yes [provider]  cyanocobalamin 2000 MCG tablet Take 2,000 mcg by mouth daily. Vitamin b12   Yes [provider]  diclofenac sodium (VOLTAREN) 1 % GEL Apply 2 g topically daily as needed (pain).   10/23/18  Yes [provider]  DULoxetine (CYMBALTA) 20 MG capsule Take 20 mg by mouth daily. 09/18/19  Yes [provider]  folic acid (FOLVITE) 1 MG tablet TAKE TWO TABLETS BY MOUTH ONCE DAILY 02/01/21  Yes Deveshwar, Abel Presto, MD  gabapentin (NEURONTIN) 600 MG tablet Take 1,200 mg by mouth 3 (three) times daily. 02/22/21  Yes [provider]  hydrocortisone (CORTEF) 5 MG tablet TAKE ONE TABLET BY MOUTH THREE TIMES DAILY 05/15/21  Yes [name], MD  Melatonin 10 MG TABS Take 10 mg by mouth at bedtime as needed.   Yes [provider]  Multiple Vitamin (MULTIVITAMIN WITH MINERALS) TABS tablet Take 1 tablet by mouth daily.   Yes [provider]  Omega-3 Fatty Acids (FISH OIL PO) Take by mouth daily.   Yes [provider]  ondansetron (ZOFRAN-ODT) 8 MG disintegrating tablet Take 1 tablet (8 mg total) by mouth every 8 (eight) hours as needed for nausea or vomiting. 07/16/21  Yes Loeffler, Adora Fridge, PA-C  predniSONE (DELTASONE) 10 MG tablet Take 1 tablet (10 mg total) by mouth daily with breakfast. 05/17/21  Yes Ennever, Rudell Cobb, MD  promethazine-dextromethorphan (PROMETHAZINE-DM) 6.25-15 MG/5ML syrup Take 10 mLs by mouth at bedtime. 03/12/21  Yes [provider]  pyridOXINE (VITAMIN B-6) 100 MG tablet Take 100 mg by mouth daily.   Yes [provider]  GLUCOSAMINE-CHONDROITIN PO Take 1 tablet by mouth 2 (two) times daily.     [provider]  Methotrexate Sodium (METHOTREXATE, PF,) 50 MG/2ML injection INJECT 0.16ml SUBCUTANEOUSLY ONCE A WEEK Patient not taking: Reported on 12/15/2020 12/14/20   Bo Merino, MD  oxyCODONE-acetaminophen (PERCOCET/ROXICET) 5-325 MG tablet TAKE ONE TABLET BY MOUTH EVERY 8 HOURS AS NEEDED FOR SEVERE PAIN. 03/29/21   [name], MD  sildenafil (VIAGRA) 50 MG tablet Take 1 tablet (50 mg total) by mouth daily as needed for erectile dysfunction. 06/15/21   Celso Amy, NP     Family  History  Adopted: Yes  Problem Relation Age of Onset   Stroke Mother    COPD Father    Diabetes Father    Dementia Father    Diabetes Sister     Social History   Socioeconomic History   Marital status: Married    Spouse name: Not on file   Number of children: Not on file   Years of education: Not on file   Highest education level: Not on file  Occupational History   Not on file  Tobacco Use   Smoking status: Never   Smokeless tobacco: Never  Vaping Use   Vaping Use: Never used  Substance and Sexual Activity   Alcohol use: No    Alcohol/week: 0.0 standard drinks   Drug use: No   Sexual activity: Yes  Other Topics Concern   Not on file  Social History Narrative   Lives with wife in a one story home.  Has 2 children.  On disability.  Education: high school.+   Social Determinants of Radio broadcast assistant Strain: Not on file  Food Insecurity: Not on file  Transportation Needs: Not on file  Physical Activity: Not on file  Stress: Not  on file  Social Connections: Not on file    Review of Systems: A 12 point ROS discussed and pertinent positives are indicated in the HPI above.  All other systems are negative.  Review of Systems  Constitutional:  Negative for activity change, appetite change, fatigue, fever and unexpected weight change.  Respiratory:  Negative for cough and shortness of breath.   Cardiovascular:  Negative for chest pain.  Gastrointestinal:  Negative for abdominal pain.  Neurological:  Negative for weakness.  Psychiatric/Behavioral:  Negative for behavioral problems and confusion.    Vital Signs: BP 121/85    Pulse 89    Temp 98.3 F (36.8 C) (Oral)    Resp 18    Ht 6' (1.829 m)    Wt 261 lb (118.4 kg)    SpO2 96%    BMI 35.40 kg/m   Physical Exam Vitals reviewed.  Cardiovascular:     Rate and Rhythm: Normal rate and regular rhythm.     Heart sounds: Normal heart sounds.  Pulmonary:     Effort: Pulmonary effort is normal.     Breath  sounds: Normal breath sounds.  Abdominal:     Palpations: Abdomen is soft.     Tenderness: There is no abdominal tenderness.  Musculoskeletal:        General: Normal range of motion.  Skin:    General: Skin is warm.  Neurological:     Mental Status: He is alert and oriented to person, place, and time.  Psychiatric:        Behavior: Behavior normal.    Imaging: DG Chest 2 View  Result Date: 07/16/2021 CLINICAL DATA:  Nausea, vomiting, cough, COVID positive EXAM: CHEST - 2 VIEW COMPARISON:  Previous studies including the examination of 06/24/2021 FINDINGS: Cardiac size is within normal limits. Thoracic aorta is tortuous. There are no signs of alveolar pulmonary edema. There are linear densities in both lower lung fields with interval increase. There is no pleural effusion or pneumothorax. IMPRESSION: Transverse linear densities seen in both lower lung fields suggesting subsegmental atelectasis. There are no signs of pulmonary edema or focal pulmonary consolidation. Electronically Signed   By: Elmer Picker M.D.   On: 07/16/2021 13:50   NM PET Image Restage (PS) Whole Body  Result Date: 07/15/2021 CLINICAL DATA:  Subsequent treatment strategy for recurrent melanoma. EXAM: NUCLEAR MEDICINE PET WHOLE BODY TECHNIQUE: 13.3 mCi F-18 FDG was injected intravenously. Full-ring PET imaging was performed from the head to foot after the radiotracer. CT data was obtained and used for attenuation correction and anatomic localization. Fasting blood glucose: Ninety-seven mg/dl COMPARISON:  03/11/2021 FINDINGS: Mediastinal blood pool activity: SUV max 2.4 HEAD/NECK: Mildly asymmetric palatine tonsillar activity, maximum SUV 7.3 on the left and 5.4 on the right, likely physiologic. Incidental CT findings: Chronic bilateral maxillary sinusitis. CHEST: No significant abnormal hypermetabolic activity in this region. Incidental CT findings: Bandlike densities in the lung bases favoring scarring and/or  atelectasis. Prior atypical infectious bronchiolitis in the superior segment right lower lobe has resolved. ABDOMEN/PELVIS: Index left periaortic lymph node 2.6 cm in short axis on image 168 of series 4, maximum SUV 26.1 (formerly 1.6 cm in short axis by my measurement, with previous maximum SUV 16.9). An adjacent lower para-aortic lymph node measures 2.0 cm in short axis on image 174 series 4 (formerly 0.5 cm) with maximum SUV of 8.2 (formerly 1.8). Right external iliac lymph node with a large amount of central necrosis or internal degeneration hypermetabolic along its margins, 6.3 cm in  short axis on image 215 series 4, maximum SUV 23.5. This previously measured 0.9 cm by my measurements meth maximum SUV of 9.0. Activity along the anal canal is most likely physiologic, maximum SUV 7.0 (formerly 4.6). The small soft tissue nodule along the lateral margin of the gluteus maximus muscle is less sharply defined today, measuring about 7 by 4 mm on image 203 of series 4 (formerly 7 by 8 mm), with only faint accentuated metabolic activity in this vicinity which extends beyond the nodule and into the adjacent muscular tissues, maximum SUV of the nodule itself about 2.2 (formerly 3.2). Faintly accentuated activity along the right groin at the site of the prior surgery. Incidental CT findings: Photopenic right hepatic cysts. Cholecystectomy. SKELETON: Mildly accentuated activity along the synovium in the shoulders, elbows, hips, knees, ankles, wrists appears increased from prior and may represent a mild flare up of arthropathy. No specific focal bony lesions suspicious for malignancy is identified. Incidental CT findings: none EXTREMITIES: No significant abnormal hypermetabolic activity in this region. Incidental CT findings: Bilateral knee joint effusions. IMPRESSION: 1. Generally worsened, with substantial increase in size and activity of abdominal and pelvic lymph nodes. 2. The small nodule along the lateral left gluteus  muscle is reduced in size and activity. 3. Accentuated synovial activity along the large joints, suggesting arthritis. Electronically Signed   By: Van Clines M.D.   On: 07/15/2021 07:25    Labs:  CBC: Recent Labs    05/17/21 1146 06/15/21 0741 07/16/21 1411 07/26/21 0833  WBC 6.2 8.9 4.9 8.0  HGB 13.4 14.2 12.7* 13.4  HCT 39.9 42.3 38.6* 38.5*  PLT 169 216 164 255    COAGS: Recent Labs    07/26/21 0833  INR 1.1    BMP: Recent Labs    04/16/21 1034 05/17/21 1146 06/15/21 0741 07/16/21 1411  NA 141 139 137 137  K 3.8 3.7 4.5 3.6  CL 106 106 104 102  CO2 27 25 24 27   GLUCOSE 86 100* 112* 91  BUN 25* 23* 27* 18  CALCIUM 9.7 9.6 9.7 8.7*  CREATININE 1.00 0.95 0.98 1.13  GFRNONAA >60 >60 >60 >60    LIVER FUNCTION TESTS: Recent Labs    04/16/21 1034 05/17/21 1146 06/15/21 0741 07/16/21 1411  BILITOT 0.4 0.4 0.3 0.6  AST 15 16 16 19   ALT 15 22 22 22   ALKPHOS 67 74 69 63  PROT 7.3 6.7 7.2 7.6  ALBUMIN 4.4 4.2 4.3 4.1    TUMOR MARKERS: No results for input(s): AFPTM, CEA, CA199, CHROMGRNA in the last 8760 hours.  Assessment and Plan:  Melanoma many times in last 4-5 years Recent recurrence  Last chemo 4 weeks ago PET + 07/14/21:  Generally worsened, with substantial increase in size and activity of abdominal and pelvic lymph nodes. Scheduled now for biopsy of Right external iliac lymph node Risks and benefits of iliac lymph node biopsy was discussed with the patient and/or patient's family including, but not limited to bleeding, infection, damage to adjacent structures or low yield requiring additional tests.  All of the questions were answered and there is agreement to proceed. Consent signed and in chart.   Thank you for this interesting consult.  I greatly enjoyed meeting James Byrd and look forward to participating in their care.  A copy of this report was sent to the requesting provider on this date.  Electronically Signed: Pasty Spillers, PA 07/26/2021, 9:09 AM   I spent a total of  30 Minutes  in face to face in clinical consultation, greater than 50% of which was counseling/coordinating care for right iliac LN Bx

## 2021-07-26 NOTE — Procedures (Signed)
Interventional Radiology Procedure Note  Procedure: CT guided core biopsy of right external iliac chain LN  Complications: None  Estimated Blood Loss: None  Recommendations: - bedrest x 2 hrs - DC home  Signed,  Criselda Peaches, MD

## 2021-07-28 ENCOUNTER — Inpatient Hospital Stay: Payer: PPO | Attending: Hematology & Oncology

## 2021-07-28 ENCOUNTER — Other Ambulatory Visit: Payer: Self-pay

## 2021-07-28 ENCOUNTER — Inpatient Hospital Stay (HOSPITAL_BASED_OUTPATIENT_CLINIC_OR_DEPARTMENT_OTHER): Payer: PPO | Admitting: Hematology & Oncology

## 2021-07-28 ENCOUNTER — Encounter: Payer: Self-pay | Admitting: Hematology & Oncology

## 2021-07-28 VITALS — BP 121/83 | HR 86 | Temp 98.1°F | Resp 18 | Ht 72.0 in | Wt 271.0 lb

## 2021-07-28 DIAGNOSIS — C774 Secondary and unspecified malignant neoplasm of inguinal and lower limb lymph nodes: Secondary | ICD-10-CM | POA: Insufficient documentation

## 2021-07-28 DIAGNOSIS — Z923 Personal history of irradiation: Secondary | ICD-10-CM | POA: Diagnosis not present

## 2021-07-28 DIAGNOSIS — D509 Iron deficiency anemia, unspecified: Secondary | ICD-10-CM | POA: Diagnosis not present

## 2021-07-28 DIAGNOSIS — C4371 Malignant melanoma of right lower limb, including hip: Secondary | ICD-10-CM | POA: Insufficient documentation

## 2021-07-28 DIAGNOSIS — Z9221 Personal history of antineoplastic chemotherapy: Secondary | ICD-10-CM | POA: Insufficient documentation

## 2021-07-28 DIAGNOSIS — M069 Rheumatoid arthritis, unspecified: Secondary | ICD-10-CM | POA: Diagnosis not present

## 2021-07-28 DIAGNOSIS — C779 Secondary and unspecified malignant neoplasm of lymph node, unspecified: Secondary | ICD-10-CM

## 2021-07-28 DIAGNOSIS — G629 Polyneuropathy, unspecified: Secondary | ICD-10-CM | POA: Insufficient documentation

## 2021-07-28 LAB — CBC WITH DIFFERENTIAL (CANCER CENTER ONLY)
Abs Immature Granulocytes: 0.04 10*3/uL (ref 0.00–0.07)
Basophils Absolute: 0 10*3/uL (ref 0.0–0.1)
Basophils Relative: 0 %
Eosinophils Absolute: 0.1 10*3/uL (ref 0.0–0.5)
Eosinophils Relative: 1 %
HCT: 34 % — ABNORMAL LOW (ref 39.0–52.0)
Hemoglobin: 11.4 g/dL — ABNORMAL LOW (ref 13.0–17.0)
Immature Granulocytes: 1 %
Lymphocytes Relative: 11 %
Lymphs Abs: 0.9 10*3/uL (ref 0.7–4.0)
MCH: 31 pg (ref 26.0–34.0)
MCHC: 33.5 g/dL (ref 30.0–36.0)
MCV: 92.4 fL (ref 80.0–100.0)
Monocytes Absolute: 0.6 10*3/uL (ref 0.1–1.0)
Monocytes Relative: 8 %
Neutro Abs: 6.6 10*3/uL (ref 1.7–7.7)
Neutrophils Relative %: 79 %
Platelet Count: 206 10*3/uL (ref 150–400)
RBC: 3.68 MIL/uL — ABNORMAL LOW (ref 4.22–5.81)
RDW: 13.3 % (ref 11.5–15.5)
WBC Count: 8.2 10*3/uL (ref 4.0–10.5)
nRBC: 0 % (ref 0.0–0.2)

## 2021-07-28 LAB — CMP (CANCER CENTER ONLY)
ALT: 16 U/L (ref 0–44)
AST: 15 U/L (ref 15–41)
Albumin: 4 g/dL (ref 3.5–5.0)
Alkaline Phosphatase: 65 U/L (ref 38–126)
Anion gap: 8 (ref 5–15)
BUN: 17 mg/dL (ref 6–20)
CO2: 26 mmol/L (ref 22–32)
Calcium: 9.3 mg/dL (ref 8.9–10.3)
Chloride: 104 mmol/L (ref 98–111)
Creatinine: 0.95 mg/dL (ref 0.61–1.24)
GFR, Estimated: >60 mL/min (ref >60)
Glucose, Bld: 103 mg/dL — ABNORMAL HIGH (ref 70–99)
Potassium: 3.6 mmol/L (ref 3.5–5.1)
Sodium: 138 mmol/L (ref 135–145)
Total Bilirubin: 0.3 mg/dL (ref 0.3–1.2)
Total Protein: 6.8 g/dL (ref 6.5–8.1)

## 2021-07-28 LAB — LACTATE DEHYDROGENASE: LDH: 221 U/L — ABNORMAL HIGH (ref 98–192)

## 2021-07-28 MED ORDER — PREGABALIN 75 MG PO CAPS: 75.0000 mg | ORAL_CAPSULE | Freq: Two times a day (BID) | ORAL | 3 refills | Status: DC

## 2021-07-28 NOTE — Progress Notes (Signed)
Hematology and Oncology Follow Up Visit  Avrohom Mckelvin [id number] March 10, 1966 56 y.o. 07/28/2021   Principle Diagnosis:  Stage IIIB ([id number]) subungual melanoma of the right hallux -- nodal recurrence in the RIGHT inguinal nodes -- BRAF wt RIGHT Common Iliac node recurrence Progression of melanoma-lymph node metastasis- 02/2021 Neuroendocrine carcinoma of the pancreas Iron def anemia   Past Therapy:        Yervoy-status post 3 cycles - discontinued in September 2016 secondary to toxicity Somatuline 120 mg IM q month -- started on 05/22/2019 - d/c on 08/2019 XRT to the RIGHT inguinal basin  Current Therapy:   Nivolumab 480 mg q month -- started on 03/19/2021, s/p cycle #4 IV Iron as indicated    Interim History:  Mr. Schumm is here today for follow-up.unfortunately, we found that he had progressive disease.  We did a PET scan on him in late January.  This was on 07/14/2021.  This did show progressive disease.  He has had COVID since we last saw him.  Thankfully, he was not hospitalized for this.  He did have a biopsy done 2 days ago.  I am awaiting the results.  I hope that the biopsy shows that the melanoma is C-kit positive.  I think this would clearly be favorable as this would allow [place] to be treated him with Gleevec.  We will also send the tumor off for molecular markers to see if there is any targetable mutations that we could use.  He does not have any problems with pain.  He is having a lot of neuropathy.  He is on gabapentin but this does seem to bother him.  I will try him on some Lyrica.  I will try him on 75 mg p.o. twice daily.  Hopefully he will be able to tolerate this and will help with his some of the neuropathy.  He has had no problems with cough.  There is no change in bowel or bladder habits.  He has had no rashes.  He does have rheumatoid arthritis.  He is on low-dose prednisone.  He has been off methotrexate.  For right now, we will keep him off methotrexate  until we know what the biopsy shows and what other treatment options might be.  If he does not have a c-kit mutation, I will then refer him to [facility].  I spoken to Dr. Manus Gunning Coliccio.  She heads up the melanoma program at [facility].  I think she would feel that he would be appropriate for a clinical trial.  He would like to try to stay with [place].  He has been with [place] for many years.  He feels comfortable with [place].  Currently, I would say his performance status is probably ECOG 1.        Medications:  Allergies as of 07/28/2021   No Known Allergies      Medication List        Accurate as of July 28, 2021  3:08 PM. If you have any questions, ask your nurse or doctor.          acetaminophen 650 MG CR tablet Commonly known as: TYLENOL Take 1,300 mg by mouth daily as needed for pain.   augmented betamethasone dipropionate 0.05 % cream Commonly known as: DIPROLENE-AF Apply 1 application topically 2 (two) times daily as needed (psoriasis).   cyanocobalamin 2000 MCG tablet Take 2,000 mcg by mouth daily. Vitamin b12   diclofenac sodium 1 % Gel Commonly known as: VOLTAREN  Apply 2 g topically daily as needed (pain).   DULoxetine 20 MG capsule Commonly known as: CYMBALTA Take 20 mg by mouth daily.   FISH OIL PO Take by mouth daily.   folic acid 1 MG tablet Commonly known as: FOLVITE TAKE TWO TABLETS BY MOUTH ONCE DAILY   gabapentin 600 MG tablet Commonly known as: NEURONTIN Take 1,200 mg by mouth 3 (three) times daily.   GLUCOSAMINE-CHONDROITIN PO Take 1 tablet by mouth 2 (two) times daily.   hydrocortisone 5 MG tablet Commonly known as: CORTEF TAKE ONE TABLET BY MOUTH THREE TIMES DAILY   Melatonin 10 MG Tabs Take 10 mg by mouth at bedtime as needed.   methotrexate (PF) 50 MG/2ML injection INJECT 0.63m SUBCUTANEOUSLY ONCE A WEEK   multivitamin with minerals Tabs tablet Take 1 tablet by mouth daily.   ondansetron 8 MG disintegrating tablet Commonly  known as: ZOFRAN-ODT Take 1 tablet (8 mg total) by mouth every 8 (eight) hours as needed for nausea or vomiting.   oxyCODONE-acetaminophen 5-325 MG tablet Commonly known as: PERCOCET/ROXICET TAKE ONE TABLET BY MOUTH EVERY 8 HOURS AS NEEDED FOR SEVERE PAIN.   predniSONE 10 MG tablet Commonly known as: DELTASONE Take 1 tablet (10 mg total) by mouth daily with breakfast.   promethazine-dextromethorphan 6.25-15 MG/5ML syrup Commonly known as: PROMETHAZINE-DM Take 10 mLs by mouth at bedtime.   pyridOXINE 100 MG tablet Commonly known as: VITAMIN B-6 Take 100 mg by mouth daily.   sildenafil 50 MG tablet Commonly known as: Viagra Take 1 tablet (50 mg total) by mouth daily as needed for erectile dysfunction.   vitamin C 1000 MG tablet Take 1,000 mg by mouth daily.   Vitamin D 50 MCG (2000 UT) tablet Take 2,000 Units by mouth daily.        Allergies: No Known Allergies  Past Medical History, Surgical history, Social history, and Family History were reviewed and updated.  Review of Systems: Review of Systems  Constitutional: Negative.   HENT: Negative.    Eyes: Negative.   Respiratory: Negative.    Cardiovascular: Negative.   Gastrointestinal: Negative.   Genitourinary: Negative.   Musculoskeletal:  Positive for joint pain and myalgias.  Skin: Negative.   Neurological: Negative.   Endo/Heme/Allergies: Negative.   Psychiatric/Behavioral: Negative.     Physical Exam:  vitals were not taken for this visit.   Wt Readings from Last 3 Encounters:  07/26/21 261 lb (118.4 kg)  06/15/21 271 lb (122.9 kg)  05/17/21 264 lb (119.7 kg)    Physical Exam Vitals reviewed.  HENT:     Head: Normocephalic and atraumatic.  Eyes:     Pupils: Pupils are equal, round, and reactive to light.  Cardiovascular:     Rate and Rhythm: Normal rate and regular rhythm.     Heart sounds: Normal heart sounds.  Pulmonary:     Effort: Pulmonary effort is normal.     Breath sounds: Normal  breath sounds.  Abdominal:     General: Bowel sounds are normal.     Palpations: Abdomen is soft.  Musculoskeletal:        General: No tenderness or deformity. Normal range of motion.     Cervical back: Normal range of motion.  Lymphadenopathy:     Cervical: No cervical adenopathy.  Skin:    General: Skin is warm and dry.     Findings: No erythema or rash.  Neurological:     Mental Status: He is alert and oriented to person, place, and time.  Psychiatric:        Behavior: Behavior normal.        Thought Content: Thought content normal.        Judgment: Judgment normal.     Lab Results  Component Value Date   WBC 8.2 07/28/2021   HGB 11.4 (L) 07/28/2021   HCT 34.0 (L) 07/28/2021   MCV 92.4 07/28/2021   PLT 206 07/28/2021   Lab Results  Component Value Date   FERRITIN 720 (H) 05/17/2021   IRON 67 05/17/2021   TIBC 243 (L) 05/17/2021   UIBC 176 05/17/2021   IRONPCTSAT 28 05/17/2021   Lab Results  Component Value Date   RETICCTPCT 1.6 11/27/2019   RBC 3.68 (L) 07/28/2021   No results found for: Nils Pyle [facility] Lab Results  Component Value Date   IGGSERUM 1,046 03/25/2020   IGMSERUM 64 03/25/2020   Lab Results  Component Value Date   ALBUMINELP 4.3 03/25/2020   A1GS 0.3 03/25/2020   A2GS 0.7 03/25/2020   BETS 0.4 03/25/2020   BETA2SER 0.3 03/25/2020   GAMS 0.9 03/25/2020   SPEI  03/25/2020     Comment:     Normal Serum Protein Electrophoresis Pattern. No abnormal protein bands (M-protein) detected.      Chemistry      Component Value Date/Time   NA 137 07/16/2021 1411   NA 138 06/04/2020 1003   NA 145 06/06/2017 1316   NA 141 10/16/2015 1334   K 3.6 07/16/2021 1411   K 4.1 06/06/2017 1316   K 3.9 10/16/2015 1334   CL 102 07/16/2021 1411   CL 108 06/06/2017 1316   CO2 27 07/16/2021 1411   CO2 26 06/06/2017 1316   CO2 21 (L) 10/16/2015 1334   BUN 18 07/16/2021 1411   BUN 17 06/04/2020 1003   BUN 19 06/06/2017 1316   BUN  20.0 10/16/2015 1334   CREATININE 1.13 07/16/2021 1411   CREATININE 0.98 06/15/2021 0741   CREATININE 1.0 06/06/2017 1316   CREATININE 1.0 10/16/2015 1334      Component Value Date/Time   CALCIUM 8.7 (L) 07/16/2021 1411   CALCIUM 9.4 06/06/2017 1316   CALCIUM 9.7 10/16/2015 1334   ALKPHOS 63 07/16/2021 1411   ALKPHOS 78 06/06/2017 1316   ALKPHOS 62 10/16/2015 1334   AST 19 07/16/2021 1411   AST 16 06/15/2021 0741   AST 16 10/16/2015 1334   ALT 22 07/16/2021 1411   ALT 22 06/15/2021 0741   ALT 27 06/06/2017 1316   ALT 11 10/16/2015 1334   BILITOT 0.6 07/16/2021 1411   BILITOT 0.3 06/15/2021 0741   BILITOT 0.49 10/16/2015 1334       Impression and Plan: Mr. Giambalvo is a very pleasant 56 yo caucasian gentleman with history of stage IIIB melanoma of the right hallux that was subungual with one microscopic positive inguinal lymph node. He completed 3 cycles of Yervoy but stopped due to side effects.   He had a recurrence in the right inguinal area that was resected and he completed radiation therapy in March 2021.   He then had another recurrence treated with radiosurgery and has now started treatment again with Nivolumab.   For right now, we just have to wait the biopsy results.  Again hopefully we will have this back.  I will then have to send the material off for molecular testing to see if there is the C-kit mutation and see if there are other targetable mutations.  His quality of life and  performance status is still incredibly good.  As such, I still think we have the like 3 of being aggressive with our options.  I know he is very well motivated.  His wife is incredibly helpful and very supportive.  We will plan to get him back depending on what we find with his biopsies.   [name], MD 2/8/20233:08 PM

## 2021-07-29 LAB — FERRITIN: Ferritin: 1079 ng/mL — ABNORMAL HIGH (ref 24–336)

## 2021-07-29 LAB — IRON AND IRON BINDING CAPACITY (CC-WL,HP ONLY)
Iron: 40 ug/dL — ABNORMAL LOW (ref 45–182)
Saturation Ratios: 18 % (ref 17.9–39.5)
TIBC: 228 ug/dL — ABNORMAL LOW (ref 250–450)
UIBC: 188 ug/dL (ref 117–376)

## 2021-07-30 ENCOUNTER — Encounter: Payer: Self-pay | Admitting: *Deleted

## 2021-07-30 LAB — SURGICAL PATHOLOGY

## 2021-07-30 NOTE — Progress Notes (Signed)
Foundation one requested through [facility] pathology per order of Dr. Marin Olp.

## 2021-08-04 ENCOUNTER — Encounter: Payer: Self-pay | Admitting: Hematology & Oncology

## 2021-08-09 ENCOUNTER — Encounter: Payer: Self-pay | Admitting: Hematology & Oncology

## 2021-08-12 ENCOUNTER — Other Ambulatory Visit: Payer: Self-pay | Admitting: Hematology & Oncology

## 2021-08-12 ENCOUNTER — Encounter (HOSPITAL_COMMUNITY): Payer: Self-pay | Admitting: Hematology & Oncology

## 2021-08-12 ENCOUNTER — Other Ambulatory Visit: Payer: Self-pay

## 2021-08-12 ENCOUNTER — Other Ambulatory Visit: Payer: Self-pay | Admitting: *Deleted

## 2021-08-12 ENCOUNTER — Encounter: Payer: Self-pay | Admitting: Hematology & Oncology

## 2021-08-12 ENCOUNTER — Ambulatory Visit (HOSPITAL_COMMUNITY)
Admission: RE | Admit: 2021-08-12 | Discharge: 2021-08-12 | Disposition: A | Discharge status: Home / Self Care | Payer: PPO | Source: Ambulatory Visit | Attending: Hematology & Oncology | Admitting: Hematology & Oncology

## 2021-08-12 DIAGNOSIS — C4371 Malignant melanoma of right lower limb, including hip: Secondary | ICD-10-CM | POA: Insufficient documentation

## 2021-08-12 DIAGNOSIS — C779 Secondary and unspecified malignant neoplasm of lymph node, unspecified: Secondary | ICD-10-CM

## 2021-08-12 MED ORDER — GADOBUTROL 1 MMOL/ML IV SOLN
10.0000 mL | Freq: Once | INTRAVENOUS | Status: AC | PRN
  Administered 2021-08-12: 10 mL via INTRAVENOUS

## 2021-08-12 MED ORDER — PREDNISONE 10 MG PO TABS: 10.0000 mg | ORAL_TABLET | Freq: Every day | ORAL | 3 refills | Status: DC

## 2021-08-12 MED ORDER — PREGABALIN 75 MG PO CAPS: 75.0000 mg | ORAL_CAPSULE | Freq: Two times a day (BID) | ORAL | 3 refills | Status: DC

## 2021-08-13 ENCOUNTER — Other Ambulatory Visit: Payer: Self-pay | Admitting: *Deleted

## 2021-08-13 ENCOUNTER — Encounter: Payer: Self-pay | Admitting: Hematology & Oncology

## 2021-08-13 ENCOUNTER — Telehealth: Payer: Self-pay | Admitting: *Deleted

## 2021-08-13 DIAGNOSIS — C4371 Malignant melanoma of right lower limb, including hip: Secondary | ICD-10-CM

## 2021-08-13 DIAGNOSIS — C7A8 Other malignant neuroendocrine tumors: Secondary | ICD-10-CM

## 2021-08-13 DIAGNOSIS — E032 Hypothyroidism due to medicaments and other exogenous substances: Secondary | ICD-10-CM

## 2021-08-13 DIAGNOSIS — D509 Iron deficiency anemia, unspecified: Secondary | ICD-10-CM

## 2021-08-13 DIAGNOSIS — N522 Drug-induced erectile dysfunction: Secondary | ICD-10-CM

## 2021-08-13 DIAGNOSIS — C779 Secondary and unspecified malignant neoplasm of lymph node, unspecified: Secondary | ICD-10-CM

## 2021-08-13 NOTE — Telephone Encounter (Signed)
Faxed referral to Dr. Harriet Masson [facility] [phone]

## 2021-08-26 ENCOUNTER — Other Ambulatory Visit: Payer: Self-pay | Admitting: *Deleted

## 2021-08-26 ENCOUNTER — Telehealth: Payer: Self-pay | Admitting: *Deleted

## 2021-08-26 ENCOUNTER — Telehealth: Payer: Self-pay

## 2021-08-26 DIAGNOSIS — C7A8 Other malignant neuroendocrine tumors: Secondary | ICD-10-CM

## 2021-08-26 DIAGNOSIS — N522 Drug-induced erectile dysfunction: Secondary | ICD-10-CM

## 2021-08-26 DIAGNOSIS — C779 Secondary and unspecified malignant neoplasm of lymph node, unspecified: Secondary | ICD-10-CM

## 2021-08-26 DIAGNOSIS — G4733 Obstructive sleep apnea (adult) (pediatric): Secondary | ICD-10-CM

## 2021-08-26 DIAGNOSIS — M06062 Rheumatoid arthritis without rheumatoid factor, left knee: Secondary | ICD-10-CM

## 2021-08-26 DIAGNOSIS — C4371 Malignant melanoma of right lower limb, including hip: Secondary | ICD-10-CM

## 2021-08-26 MED ORDER — OXYCODONE-ACETAMINOPHEN 5-325 MG PO TABS: ORAL_TABLET | ORAL | 0 refills | Status: DC

## 2021-08-26 NOTE — Telephone Encounter (Signed)
Spoke with patient and his wife. Advised we will need to see him here in the office prior to making any recommendations as whether to restart MTX. Patient has not been seen since 11/2020. Patient wife states "he has some MTX left and he will probably go ahead and restart it and then come in". Advised patient and his wife not to do so as we need to make sure it is appropriate for patient to restart the MTX. Patient has been scheduled for 08/31/2021 at 9:00 am.  ?

## 2021-08-26 NOTE — Progress Notes (Unsigned)
Office Visit Note  Patient: James Byrd             Date of Birth: [birth year]           MRN: [id number]             PCP: Emmaline Kluver, MD Referring: Emmaline Kluver, * Visit Date: 08/31/2021 Occupation: '@GUAROCC'$ @  Subjective:  Discuss restarting methotrexate   History of Present Illness: [place] Talcott is a 56 y.o. male with history of chronic inflammatory arthritis and osteoarthritis.   CBC and CMP updated on 08/27/21.  Creatinine and GFR WNL.  LFTs WNL.   Activities of Daily Living:  Patient reports morning stiffness for *** {minute/hour:19697}.   Patient {ACTIONS;DENIES/REPORTS:21021675::"Denies"} nocturnal pain.  Difficulty dressing/grooming: {ACTIONS;DENIES/REPORTS:21021675::"Denies"} Difficulty climbing stairs: {ACTIONS;DENIES/REPORTS:21021675::"Denies"} Difficulty getting out of chair: {ACTIONS;DENIES/REPORTS:21021675::"Denies"} Difficulty using hands for taps, buttons, cutlery, and/or writing: {ACTIONS;DENIES/REPORTS:21021675::"Denies"}  No Rheumatology ROS completed.   PMFS History:  Patient Active Problem List   Diagnosis Date Noted   Metastatic melanoma to lymph node ([place]) 12/16/2020   Pain in both feet 10/17/2019   Bilateral pulmonary embolism ([place]) 08/26/2019   Neuroendocrine carcinoma of pancreas ([place]) 05/22/2019   Severe sepsis with septic shock ([place]) 01/15/2019   Acute respiratory failure with hypoxia ([place]) 01/15/2019   AKI (acute kidney injury) ([place]) 01/15/2019   Septic shock ([place]) 01/13/2019   Goals of care, counseling/discussion 12/17/2018   Adrenal insufficiency (HCC)    Refractory nausea and vomiting    Paranasal sinus disease    Acute renal failure syndrome ([place])    HCAP (healthcare-associated pneumonia) 03/06/2015   Drug-induced pneumonitis - VERVOY    Thrombocytopenia ([place]) 02/09/2015   HLD (hyperlipidemia)    Chronic diastolic congestive heart failure ([place])    Malignant melanoma of right great toe Stage IIIB ([id number])  11/14/2014   Obstructive sleep apnea 07/19/2012   Paroxysmal supraventricular tachycardia ([place]) 05/24/2012   Chest pressure 05/24/2012   Palpitations 05/24/2012   Anxiety 05/24/2012    Past Medical History:  Diagnosis Date   Adrenal insufficiency ([place])    Anxiety 82/09/2351   Complication of anesthesia    SLO TO AWAKEN MANY 8 TO 9 YRS AGO, NO ISSUES SINCE   Goals of care, counseling/discussion 12/17/2018   Gout    History of kidney stones    HLD (hyperlipidemia)    Malignant melanoma of right great toe ([place]) 11/14/2014   Neuroendocrine carcinoma of pancreas ([place]) 05/22/2019   Paroxysmal supraventricular tachycardia ([place]) 05/24/2012   Described in the treadmill report; details are pending    PE (pulmonary thromboembolism) ([place]) 08/2019   Rheumatoid arthritis ([place])    Sleep apnea    MILD NO CPAP NEEDED    Family History  Adopted: Yes  Problem Relation Age of Onset   Stroke Mother    COPD Father    Diabetes Father    Dementia Father    Diabetes Sister    Past Surgical History:  Procedure Laterality Date   CHOLECYSTECTOMY     ESOPHAGOGASTRODUODENOSCOPY (EGD) WITH PROPOFOL N/A 05/13/2019   Procedure: ESOPHAGOGASTRODUODENOSCOPY (EGD) WITH PROPOFOL;  Surgeon: Arta Silence, MD;  Location: [facility];  Service: Endoscopy;  Laterality: N/A;   EXTRACORPOREAL SHOCK WAVE LITHOTRIPSY  2016   FINE NEEDLE ASPIRATION N/A 05/13/2019   Procedure: FINE NEEDLE ASPIRATION (FNA) LINEAR;  Surgeon: Arta Silence, MD;  Location: [facility];  Service: Endoscopy;  Laterality: N/A;   LYMPH NODE BIOPSY Right 11/06/2018   Procedure: RIGHT INGUINAL LYMPH NODE BIOPSY AND POSSIBLE RIGHT GROIN NODE  DISECTION;  Surgeon: Alphonsa Overall, MD;  Location: [facility];  Service: General;  Laterality: Right;   r toe partial ambutation     ROTATOR CUFF REPAIR Right 2010   3 yrs ago   UPPER ESOPHAGEAL ENDOSCOPIC ULTRASOUND (EUS) N/A 05/13/2019   Procedure: UPPER ESOPHAGEAL ENDOSCOPIC ULTRASOUND (EUS);  Surgeon:  Arta Silence, MD;  Location: [facility];  Service: Endoscopy;  Laterality: N/A;   Social History   Social History Narrative   Lives with wife in a one story home.  Has 2 children.  On disability.  Education: high school.+   Immunization History  Administered Date(s) Administered   Influenza,inj,Quad PF,6+ Mos 03/19/2019   Influenza-Unspecified 04/03/2014     Objective: Vital Signs: There were no vitals taken for this visit.   Physical Exam Vitals and nursing note reviewed.  Constitutional:      Appearance: He is well-developed.  HENT:     Head: Normocephalic and atraumatic.  Eyes:     Conjunctiva/sclera: Conjunctivae normal.     Pupils: Pupils are equal, round, and reactive to light.  Cardiovascular:     Rate and Rhythm: Normal rate and regular rhythm.     Heart sounds: Normal heart sounds.  Pulmonary:     Effort: Pulmonary effort is normal.     Breath sounds: Normal breath sounds.  Abdominal:     General: Bowel sounds are normal.     Palpations: Abdomen is soft.  Musculoskeletal:     Cervical back: Normal range of motion and neck supple.  Skin:    General: Skin is warm and dry.     Capillary Refill: Capillary refill takes less than 2 seconds.  Neurological:     Mental Status: He is alert and oriented to person, place, and time.  Psychiatric:        Behavior: Behavior normal.     Musculoskeletal Exam: ***  CDAI Exam: CDAI Score: -- Patient Global: --; Provider Global: -- Swollen: --; Tender: -- Joint Exam 08/31/2021   No joint exam has been documented for this visit   There is currently no information documented on the homunculus. Go to the Rheumatology activity and complete the homunculus joint exam.  Investigation: No additional findings.  Imaging: MR Brain W Wo Contrast  Result Date: 08/12/2021 CLINICAL DATA:  Skin cancer staging.  Melanoma right great toe. EXAM: MRI HEAD WITHOUT AND WITH CONTRAST TECHNIQUE: Multiplanar, multiecho pulse sequences  of the brain and surrounding structures were obtained without and with intravenous contrast. CONTRAST:  2m GADAVIST GADOBUTROL 1 MMOL/ML IV SOLN COMPARISON:  MRI head 06/16/2015 FINDINGS: Brain: No acute infarction, hemorrhage, hydrocephalus, extra-axial collection or mass lesion. Small white matter hyperintensity right frontal white matter. Small area of susceptibility in the right cerebellum. No associated edema or enhancement. Findings consistent with chronic microhemorrhage. Negative for mass lesion. Normal enhancement. Negative for metastatic disease. Vascular: Normal arterial flow voids Skull and upper cervical spine: Negative Sinuses/Orbits: Mild mucosal edema paranasal sinuses. Negative orbit Other: None IMPRESSION: Negative for metastatic disease.  No acute abnormality. Electronically Signed   By: CFranchot GalloM.D.   On: 08/12/2021 17:46    Recent Labs: Lab Results  Component Value Date   WBC 8.2 07/28/2021   HGB 11.4 (L) 07/28/2021   PLT 206 07/28/2021   NA 138 07/28/2021   K 3.6 07/28/2021   CL 104 07/28/2021   CO2 26 07/28/2021   GLUCOSE 103 (H) 07/28/2021   BUN 17 07/28/2021   CREATININE 0.95 07/28/2021   BILITOT 0.3  07/28/2021   ALKPHOS 65 07/28/2021   AST 15 07/28/2021   ALT 16 07/28/2021   PROT 6.8 07/28/2021   ALBUMIN 4.0 07/28/2021   CALCIUM 9.3 07/28/2021   GFRAA 105 06/04/2020   QFTBGOLDPLUS NEGATIVE 03/25/2020    Speciality Comments: No specialty comments available.  Procedures:  No procedures performed Allergies: Patient has no known allergies.   Assessment / Plan:     Visit Diagnoses: No diagnosis found.  Orders: No orders of the defined types were placed in this encounter.  No orders of the defined types were placed in this encounter.   Face-to-face time spent with patient was *** minutes. Greater than 50% of time was spent in counseling and coordination of care.  Follow-Up Instructions: No follow-ups on file.   Earnestine Mealing, CMA  Note -  This record has been created using Editor, commissioning.  Chart creation errors have been sought, but may not always  have been located. Such creation errors do not reflect on  the standard of medical care.

## 2021-08-26 NOTE — Telephone Encounter (Signed)
Patient called stating his oncologist told him to contact Dr. Estanislado Pandy to see if he can start back up with his methotrexate injections.  Patient states he has completed the session of chemotherapy and has a lot of swelling in his legs and arms.  Patient states he still has a couple of injections left from his previous prescription that he never finished and requested a return call.   ?

## 2021-08-26 NOTE — Telephone Encounter (Signed)
Call placed to patient's wife to let her know that Dr Marin Olp has spoken with Dr. Harriet Masson and that he would like to see pt tomorrow morning at 0830. Hoyle Sauer is appreciative of call and states that pt will be here at 0830 tomorrow morning.  ?

## 2021-08-27 ENCOUNTER — Other Ambulatory Visit: Payer: Self-pay

## 2021-08-27 ENCOUNTER — Inpatient Hospital Stay (HOSPITAL_BASED_OUTPATIENT_CLINIC_OR_DEPARTMENT_OTHER): Payer: PPO | Admitting: Hematology & Oncology

## 2021-08-27 ENCOUNTER — Encounter: Payer: Self-pay | Admitting: Hematology & Oncology

## 2021-08-27 ENCOUNTER — Inpatient Hospital Stay: Payer: PPO | Attending: Hematology & Oncology

## 2021-08-27 ENCOUNTER — Inpatient Hospital Stay: Payer: PPO

## 2021-08-27 VITALS — BP 115/82 | HR 91 | Temp 97.8°F | Resp 20 | Wt 270.0 lb

## 2021-08-27 DIAGNOSIS — C7A8 Other malignant neuroendocrine tumors: Secondary | ICD-10-CM | POA: Diagnosis not present

## 2021-08-27 DIAGNOSIS — C774 Secondary and unspecified malignant neoplasm of inguinal and lower limb lymph nodes: Secondary | ICD-10-CM | POA: Diagnosis present

## 2021-08-27 DIAGNOSIS — Z79899 Other long term (current) drug therapy: Secondary | ICD-10-CM | POA: Insufficient documentation

## 2021-08-27 DIAGNOSIS — C779 Secondary and unspecified malignant neoplasm of lymph node, unspecified: Secondary | ICD-10-CM

## 2021-08-27 DIAGNOSIS — N522 Drug-induced erectile dysfunction: Secondary | ICD-10-CM

## 2021-08-27 DIAGNOSIS — C4371 Malignant melanoma of right lower limb, including hip: Secondary | ICD-10-CM | POA: Insufficient documentation

## 2021-08-27 LAB — CBC WITH DIFFERENTIAL (CANCER CENTER ONLY)
Abs Immature Granulocytes: 0.02 10*3/uL (ref 0.00–0.07)
Basophils Absolute: 0 10*3/uL (ref 0.0–0.1)
Basophils Relative: 0 %
Eosinophils Absolute: 0.1 10*3/uL (ref 0.0–0.5)
Eosinophils Relative: 3 %
HCT: 39.6 % (ref 39.0–52.0)
Hemoglobin: 13.3 g/dL (ref 13.0–17.0)
Immature Granulocytes: 0 %
Lymphocytes Relative: 18 %
Lymphs Abs: 0.8 10*3/uL (ref 0.7–4.0)
MCH: 30.6 pg (ref 26.0–34.0)
MCHC: 33.6 g/dL (ref 30.0–36.0)
MCV: 91.2 fL (ref 80.0–100.0)
Monocytes Absolute: 0.5 10*3/uL (ref 0.1–1.0)
Monocytes Relative: 11 %
Neutro Abs: 3.2 10*3/uL (ref 1.7–7.7)
Neutrophils Relative %: 68 %
Platelet Count: 229 10*3/uL (ref 150–400)
RBC: 4.34 MIL/uL (ref 4.22–5.81)
RDW: 13.4 % (ref 11.5–15.5)
WBC Count: 4.7 10*3/uL (ref 4.0–10.5)
nRBC: 0 % (ref 0.0–0.2)

## 2021-08-27 LAB — CMP (CANCER CENTER ONLY)
ALT: 18 U/L (ref 0–44)
AST: 19 U/L (ref 15–41)
Albumin: 4.1 g/dL (ref 3.5–5.0)
Alkaline Phosphatase: 70 U/L (ref 38–126)
Anion gap: 10 (ref 5–15)
BUN: 19 mg/dL (ref 6–20)
CO2: 29 mmol/L (ref 22–32)
Calcium: 9.6 mg/dL (ref 8.9–10.3)
Chloride: 99 mmol/L (ref 98–111)
Creatinine: 1.16 mg/dL (ref 0.61–1.24)
GFR, Estimated: >60 mL/min (ref >60)
Glucose, Bld: 107 mg/dL — ABNORMAL HIGH (ref 70–99)
Potassium: 3.9 mmol/L (ref 3.5–5.1)
Sodium: 138 mmol/L (ref 135–145)
Total Bilirubin: 0.8 mg/dL (ref 0.3–1.2)
Total Protein: 7.7 g/dL (ref 6.5–8.1)

## 2021-08-27 LAB — LACTATE DEHYDROGENASE: LDH: 294 U/L — ABNORMAL HIGH (ref 98–192)

## 2021-08-27 LAB — IRON AND IRON BINDING CAPACITY (CC-WL,HP ONLY)
Iron: 39 ug/dL — ABNORMAL LOW (ref 45–182)
Saturation Ratios: 17 % — ABNORMAL LOW (ref 17.9–39.5)
TIBC: 228 ug/dL — ABNORMAL LOW (ref 250–450)
UIBC: 189 ug/dL (ref 117–376)

## 2021-08-27 LAB — FERRITIN: Ferritin: 1601 ng/mL — ABNORMAL HIGH (ref 24–336)

## 2021-08-27 LAB — PREALBUMIN: Prealbumin: 17.1 mg/dL — ABNORMAL LOW (ref 18–38)

## 2021-08-27 MED ORDER — FENTANYL 25 MCG/HR TD PT72
1.0000 | MEDICATED_PATCH | TRANSDERMAL | 0 refills | Status: DC
Start: 2021-08-27 — End: 2021-08-31

## 2021-08-27 MED ORDER — KETOROLAC TROMETHAMINE 15 MG/ML IJ SOLN
30.0000 mg | Freq: Once | INTRAMUSCULAR | Status: AC
  Administered 2021-08-27: 30 mg via INTRAVENOUS
  Filled 2021-08-27: qty 2

## 2021-08-27 MED ORDER — KETOROLAC TROMETHAMINE 15 MG/ML IJ SOLN: 30.0000 mg | Freq: Once | INTRAMUSCULAR | Status: DC

## 2021-08-27 MED ORDER — METOCLOPRAMIDE HCL 5 MG/ML IJ SOLN
20.0000 mg | Freq: Once | INTRAVENOUS | Status: AC
  Administered 2021-08-27: 20 mg via INTRAVENOUS
  Filled 2021-08-27: qty 4

## 2021-08-27 MED ORDER — HYDROMORPHONE HCL 4 MG/ML IJ SOLN
4.0000 mg | Freq: Once | INTRAMUSCULAR | Status: AC
  Administered 2021-08-27: 4 mg via INTRAVENOUS
  Filled 2021-08-27: qty 1

## 2021-08-27 MED ORDER — HYDROMORPHONE HCL 4 MG/ML IJ SOLN: 4.0000 mg | INTRAMUSCULAR | Status: DC | PRN

## 2021-08-27 MED ORDER — SODIUM CHLORIDE 0.9 % IV SOLN: INTRAVENOUS | Status: AC

## 2021-08-27 NOTE — Progress Notes (Signed)
Hematology and Oncology Follow Up Visit  James Byrd [id number] Apr 27, 1966 56 y.o. 08/27/2021   Principle Diagnosis:  Stage IIIB ([id number]) subungual melanoma of the right hallux -- nodal recurrence in the RIGHT inguinal nodes -- BRAF wt RIGHT Common Iliac node recurrence Progression of melanoma-lymph node metastasis- 02/2021 Neuroendocrine carcinoma of the pancreas Iron def anemia   Past Therapy:        Yervoy-status post 3 cycles - discontinued in September 2016 secondary to toxicity Somatuline 120 mg IM q month -- started on 05/22/2019 - d/c on 08/2019 XRT to the RIGHT inguinal basin  Current Therapy:   Nivolumab 480 mg q month -- started on 03/19/2021, s/p cycle #4 IV Iron as indicated    Interim History:  James Byrd is here today for follow-up.  He clearly has progressive disease.  We did do the C-kit mutation which was negative.  We did have him go down to [facility] to see about any clinical trial.  He saw the wonderful Dr. Manus Gunning Colicchio.  She is going to send his material off for another marker- LAG -to see if we qualify for a Phase 1/2 trial.  He clearly has declined.  He has had a lot of pain in the left testicle area.  This might be referred pain.  There is little bit of swelling when I examined this area.  I would like to get a ultrasound of his scrotum to make sure that there is no testicular torsion that might be causing problems for him.  Pain is definitely an issue.  We have him on some oxycodone.  This does not seem to be holding the pain all that well.  I just do not want to see him miserable.  I think is going need some kind of long-acting pain medication.  I will see about sending in a Duragesic patch (25 mcg every 3 days to the skin) and see if this might help control the pain a bit better.  He is on Lyrica which might help with some neuropathic issues.  He may benefit from some radiation therapy.  I will have to call Radiation Oncology on Monday.  His  disease basically in the lymph nodes.  He does have a large right external iliac lymph node measuring 6.3 cm.  He has multiple left pelvic lymph nodes.  He has significant rheumatoid arthritis.  This is causing pain for him.  He has been off medication for this.  He is going to see his rheumatologist next week to try to get back on medication.  I know the options are not all that great.  We could certainly consider chemotherapy for him.  I know that dacarbazine has been utilized in patients with melanoma.  In speaking with Dr. Dwyane Luo, they may want to also consider the intratumoral vaccine.  However, this might be somewhat difficult to administer because of the death of the lymph nodes.  I just hate that he is having such a tough time right now.  Liver looks to be somewhat dehydrated.  We will give him some IV fluids.  We will give him some IV pain medication.  We will see about giving him some IV Toradol for anti-inflammatory effect.  Overall, I would say his performance status is probably ECOG 2.       Medications:  Allergies as of 08/27/2021   No Known Allergies      Medication List        Accurate as of August 27, 2021  5:29 PM. If you have any questions, ask your nurse or doctor.          STOP taking these medications    DULoxetine 20 MG capsule Commonly known as: CYMBALTA Stopped by: [name], MD   gabapentin 600 MG tablet Commonly known as: NEURONTIN Stopped by: [name], MD       TAKE these medications    acetaminophen 650 MG CR tablet Commonly known as: TYLENOL Take 1,300 mg by mouth daily as needed for pain.   augmented betamethasone dipropionate 0.05 % cream Commonly known as: DIPROLENE-AF Apply 1 application topically 2 (two) times daily as needed (psoriasis).   BLACK CURRANT SEED OIL PO Take 2 tablets by mouth daily.   cyanocobalamin 2000 MCG tablet Take 2,000 mcg by mouth daily. Vitamin b12   diclofenac sodium 1 % Gel Commonly  known as: VOLTAREN Apply 2 g topically daily as needed (pain).   fentaNYL 25 MCG/HR Commonly known as: [place] 1 patch onto the skin every 3 (three) days. Started by: [name], MD   FISH OIL PO Take by mouth daily.   folic acid 1 MG tablet Commonly known as: FOLVITE TAKE TWO TABLETS BY MOUTH ONCE DAILY   hydrocortisone 5 MG tablet Commonly known as: CORTEF Take 5 mg by mouth 3 (three) times daily. What changed: Another medication with the same name was removed. Continue taking this medication, and follow the directions you see here. Changed by: [name], MD   levofloxacin 750 MG tablet Commonly known as: LEVAQUIN Take 750 mg by mouth daily.   Melatonin 10 MG Tabs Take 10 mg by mouth at bedtime as needed.   multivitamin with minerals Tabs tablet Take 1 tablet by mouth daily.   ondansetron 8 MG disintegrating tablet Commonly known as: ZOFRAN-ODT Take 1 tablet (8 mg total) by mouth every 8 (eight) hours as needed for nausea or vomiting.   oxyCODONE-acetaminophen 5-325 MG tablet Commonly known as: PERCOCET/ROXICET TAKE ONE TABLET BY MOUTH EVERY 8 HOURS AS NEEDED FOR SEVERE PAIN.   predniSONE 10 MG tablet Commonly known as: DELTASONE Take 1 tablet (10 mg total) by mouth daily with breakfast.   pregabalin 75 MG capsule Commonly known as: LYRICA Take 75 mg by mouth 2 (two) times daily. What changed: Another medication with the same name was removed. Continue taking this medication, and follow the directions you see here. Changed by: [name], MD   pyridOXINE 100 MG tablet Commonly known as: VITAMIN B-6 Take 100 mg by mouth daily.   sildenafil 50 MG tablet Commonly known as: Viagra Take 1 tablet (50 mg total) by mouth daily as needed for erectile dysfunction.   vitamin C 1000 MG tablet Take 1,000 mg by mouth daily.   Vitamin D 50 MCG (2000 UT) tablet Take 2,000 Units by mouth daily.        Allergies: No Known Allergies  Past  Medical History, Surgical history, Social history, and Family History were reviewed and updated.  Review of Systems: Review of Systems  Constitutional: Negative.   HENT: Negative.    Eyes: Negative.   Respiratory: Negative.    Cardiovascular: Negative.   Gastrointestinal: Negative.   Genitourinary: Negative.   Musculoskeletal:  Positive for joint pain and myalgias.  Skin: Negative.   Neurological: Negative.   Endo/Heme/Allergies: Negative.   Psychiatric/Behavioral: Negative.     Physical Exam:  weight is 270 lb (122.5 kg). His oral temperature is 97.8 F (36.6 C). His blood  pressure is 115/82 and his pulse is 91. His respiration is 20 and oxygen saturation is 96%.   Wt Readings from Last 3 Encounters:  08/27/21 270 lb (122.5 kg)  07/28/21 271 lb (122.9 kg)  07/26/21 261 lb (118.4 kg)    Physical Exam Vitals reviewed.  HENT:     Head: Normocephalic and atraumatic.  Eyes:     Pupils: Pupils are equal, round, and reactive to light.  Cardiovascular:     Rate and Rhythm: Normal rate and regular rhythm.     Heart sounds: Normal heart sounds.  Pulmonary:     Effort: Pulmonary effort is normal.     Breath sounds: Normal breath sounds.  Abdominal:     General: Bowel sounds are normal.     Palpations: Abdomen is soft.  Musculoskeletal:        General: No tenderness or deformity. Normal range of motion.     Cervical back: Normal range of motion.  Lymphadenopathy:     Cervical: No cervical adenopathy.  Skin:    General: Skin is warm and dry.     Findings: No erythema or rash.  Neurological:     Mental Status: He is alert and oriented to person, place, and time.  Psychiatric:        Behavior: Behavior normal.        Thought Content: Thought content normal.        Judgment: Judgment normal.     Lab Results  Component Value Date   WBC 4.7 08/27/2021   HGB 13.3 08/27/2021   HCT 39.6 08/27/2021   MCV 91.2 08/27/2021   PLT 229 08/27/2021   Lab Results  Component  Value Date   FERRITIN 1,601 (H) 08/27/2021   IRON 39 (L) 08/27/2021   TIBC 228 (L) 08/27/2021   UIBC 189 08/27/2021   IRONPCTSAT 17 (L) 08/27/2021   Lab Results  Component Value Date   RETICCTPCT 1.6 11/27/2019   RBC 4.34 08/27/2021   No results found for: Nils Pyle [facility] Lab Results  Component Value Date   IGGSERUM 1,046 03/25/2020   IGMSERUM 64 03/25/2020   Lab Results  Component Value Date   ALBUMINELP 4.3 03/25/2020   A1GS 0.3 03/25/2020   A2GS 0.7 03/25/2020   BETS 0.4 03/25/2020   BETA2SER 0.3 03/25/2020   GAMS 0.9 03/25/2020   SPEI  03/25/2020     Comment:     Normal Serum Protein Electrophoresis Pattern. No abnormal protein bands (M-protein) detected.      Chemistry      Component Value Date/Time   NA 138 08/27/2021 0831   NA 138 06/04/2020 1003   NA 145 06/06/2017 1316   NA 141 10/16/2015 1334   K 3.9 08/27/2021 0831   K 4.1 06/06/2017 1316   K 3.9 10/16/2015 1334   CL 99 08/27/2021 0831   CL 108 06/06/2017 1316   CO2 29 08/27/2021 0831   CO2 26 06/06/2017 1316   CO2 21 (L) 10/16/2015 1334   BUN 19 08/27/2021 0831   BUN 17 06/04/2020 1003   BUN 19 06/06/2017 1316   BUN 20.0 10/16/2015 1334   CREATININE 1.16 08/27/2021 0831   CREATININE 1.0 06/06/2017 1316   CREATININE 1.0 10/16/2015 1334      Component Value Date/Time   CALCIUM 9.6 08/27/2021 0831   CALCIUM 9.4 06/06/2017 1316   CALCIUM 9.7 10/16/2015 1334   ALKPHOS 70 08/27/2021 0831   ALKPHOS 78 06/06/2017 1316   ALKPHOS 62 10/16/2015 1334  AST 19 08/27/2021 0831   AST 16 10/16/2015 1334   ALT 18 08/27/2021 0831   ALT 27 06/06/2017 1316   ALT 11 10/16/2015 1334   BILITOT 0.8 08/27/2021 0831   BILITOT 0.49 10/16/2015 1334       Impression and Plan: James Byrd is a very nice  56 yo caucasian gentleman with history of stage IIIB melanoma of the right hallux that was subungual with one microscopic positive inguinal lymph node. He completed 3 cycles of Yervoy but  stopped due to side effects.   He had a recurrence in the right inguinal area that was resected and he completed radiation therapy in March 2021.   Our goal clearly is his quality of life.  Hopefully, we will be able to get him feeling a little bit better.  We will have to see if Radiation Oncology will be able to help out with any palliative radiation therapy.  I am sure that [facility] will let us know if they do think he would be a candidate for any protocol therapy.  I note that they are worried about some abnormality in the pancreas.  This she has been biopsied in the past.  This was 4 years ago.  This was a low-grade neuroendocrine tumor which was not melanoma.  This really has not been a problem for him.  I think his iron is on the low side.  We may want to think about giving him some IV iron.  Hopefully this will make him feel a bit better.  We will plan to get him back to see [place] probably in a week or so if not sooner.  Again I just hate that he is declining and having a tough time with his melanoma.   [name], MD 3/10/20235:29 PM

## 2021-08-27 NOTE — Patient Instructions (Signed)

## 2021-08-28 ENCOUNTER — Ambulatory Visit (HOSPITAL_BASED_OUTPATIENT_CLINIC_OR_DEPARTMENT_OTHER)
Admission: RE | Admit: 2021-08-28 | Discharge: 2021-08-28 | Disposition: A | Discharge status: Home / Self Care | Payer: PPO | Source: Ambulatory Visit | Attending: Hematology & Oncology | Admitting: Hematology & Oncology

## 2021-08-28 DIAGNOSIS — C779 Secondary and unspecified malignant neoplasm of lymph node, unspecified: Secondary | ICD-10-CM | POA: Insufficient documentation

## 2021-08-30 ENCOUNTER — Encounter: Payer: Self-pay | Admitting: *Deleted

## 2021-08-30 NOTE — Progress Notes (Signed)
Patient is no longer receiving nivolumab. ?Careplan removed per Dr. Antonieta Pert instructions. ?

## 2021-08-31 ENCOUNTER — Ambulatory Visit: Payer: PPO | Admitting: Physician Assistant

## 2021-08-31 ENCOUNTER — Other Ambulatory Visit: Payer: Self-pay

## 2021-08-31 ENCOUNTER — Encounter: Payer: Self-pay | Admitting: Physician Assistant

## 2021-08-31 VITALS — BP 123/81 | HR 75 | Ht 72.0 in | Wt 270.0 lb

## 2021-08-31 DIAGNOSIS — T50905A Adverse effect of unspecified drugs, medicaments and biological substances, initial encounter: Secondary | ICD-10-CM

## 2021-08-31 DIAGNOSIS — E2740 Unspecified adrenocortical insufficiency: Secondary | ICD-10-CM

## 2021-08-31 DIAGNOSIS — M1990 Unspecified osteoarthritis, unspecified site: Secondary | ICD-10-CM | POA: Diagnosis not present

## 2021-08-31 DIAGNOSIS — C779 Secondary and unspecified malignant neoplasm of lymph node, unspecified: Secondary | ICD-10-CM

## 2021-08-31 DIAGNOSIS — Z79899 Other long term (current) drug therapy: Secondary | ICD-10-CM | POA: Diagnosis not present

## 2021-08-31 DIAGNOSIS — R6521 Severe sepsis with septic shock: Secondary | ICD-10-CM

## 2021-08-31 DIAGNOSIS — M19071 Primary osteoarthritis, right ankle and foot: Secondary | ICD-10-CM

## 2021-08-31 DIAGNOSIS — M19042 Primary osteoarthritis, left hand: Secondary | ICD-10-CM

## 2021-08-31 DIAGNOSIS — C4371 Malignant melanoma of right lower limb, including hip: Secondary | ICD-10-CM

## 2021-08-31 DIAGNOSIS — M170 Bilateral primary osteoarthritis of knee: Secondary | ICD-10-CM | POA: Diagnosis not present

## 2021-08-31 DIAGNOSIS — M19041 Primary osteoarthritis, right hand: Secondary | ICD-10-CM

## 2021-08-31 DIAGNOSIS — I471 Supraventricular tachycardia: Secondary | ICD-10-CM

## 2021-08-31 DIAGNOSIS — A419 Sepsis, unspecified organism: Secondary | ICD-10-CM

## 2021-08-31 DIAGNOSIS — G4733 Obstructive sleep apnea (adult) (pediatric): Secondary | ICD-10-CM

## 2021-08-31 DIAGNOSIS — C7A8 Other malignant neuroendocrine tumors: Secondary | ICD-10-CM

## 2021-08-31 DIAGNOSIS — Z8639 Personal history of other endocrine, nutritional and metabolic disease: Secondary | ICD-10-CM

## 2021-08-31 DIAGNOSIS — M19072 Primary osteoarthritis, left ankle and foot: Secondary | ICD-10-CM

## 2021-08-31 DIAGNOSIS — J189 Pneumonia, unspecified organism: Secondary | ICD-10-CM

## 2021-08-31 DIAGNOSIS — I2699 Other pulmonary embolism without acute cor pulmonale: Secondary | ICD-10-CM

## 2021-08-31 MED ORDER — FENTANYL 25 MCG/HR TD PT72: 1.0000 | MEDICATED_PATCH | TRANSDERMAL | 0 refills | Status: DC

## 2021-08-31 NOTE — Patient Instructions (Addendum)
Standing Labs ?We placed an order today for your standing lab work.  ? ?Please have your standing labs drawn in 2 weeks x2, 2 months, then every 3 months  ? ?If possible, please have your labs drawn 2 weeks prior to your appointment so that the provider can discuss your results at your appointment. ? ?Please note that you may see your imaging and lab results in [place] before we have reviewed them. ?We may be awaiting multiple results to interpret others before contacting you. ?Please allow our office up to 72 hours to thoroughly review all of the results before contacting the office for clarification of your results. ? ?We have open lab daily: ?Monday through Thursday from 1:30-4:30 PM and Friday from 1:30-4:00 PM ?at the office of Dr. Bo Merino, [facility].   ?Please be advised, all patients with office appointments requiring lab work will take precedent over walk-in lab work.  ?If possible, please come for your lab work on Monday and Friday afternoons, as you may experience shorter wait times. ?The office is located at [address] [place], [place], [place] [address] ?No appointment is necessary.   ?Labs are drawn by [facility]. Please bring your co-pay at the time of your lab draw.  You may receive a bill from [facility] for your lab work. ? ?Please note if you are on Hydroxychloroquine and and an order has been placed for a Hydroxychloroquine level, you will need to have it drawn 4 hours or more after your last dose. ? ?If you wish to have your labs drawn at another location, please call the office 24 hours in advance to send orders. ? ?If you have any questions regarding directions or hours of operation,  ?please call [phone].   ?As a reminder, please drink plenty of water prior to coming for your lab work. Thanks! ? ? ?Leflunomide tablets ?What is this medication? ?LEFLUNOMIDE (le FLOO na mide) is for rheumatoid arthritis. ?This medicine may be used for other purposes; ask your health  care provider or pharmacist if you have questions. ?COMMON BRAND NAME(S): Arava ?What should I tell my care team before I take this medication? ?They need to know if you have any of these conditions: ?diabetes ?have a fever or infection ?high blood pressure ?immune system problems ?kidney disease ?liver disease ?low blood cell counts, like low white cell, platelet, or red cell counts ?lung or breathing disease, like asthma ?recently received or scheduled to receive a vaccine ?receiving treatment for cancer ?skin conditions or sensitivity ?tingling of the fingers or toes, or other nerve disorder ?tuberculosis ?an unusual or allergic reaction to leflunomide, teriflunomide, other medicines, food, dyes, or preservatives ?pregnant or trying to get pregnant ?breast-feeding ?How should I use this medication? ?Take this medicine by mouth with a full glass of water. Follow the directions on the prescription label. Take your medicine at regular intervals. Do not take your medicine more often than directed. Do not stop taking except on your doctor's advice. ?Talk to your pediatrician regarding the use of this medicine in children. Special care may be needed. ?Overdosage: If you think you have taken too much of this medicine contact a poison control center or emergency room at once. ?NOTE: This medicine is only for you. Do not share this medicine with others. ?What if I miss a dose? ?If you miss a dose, take it as soon as you can. If it is almost time for your next dose, take only that dose. Do not take double  or extra doses. ?What may interact with this medication? ?Do not take this medicine with any of the following medications: ?teriflunomide ?This medicine may also interact with the following medications: ?alosetron ?birth control pills ?caffeine ?cefaclor ?certain medicines for diabetes like nateglinide, repaglinide, rosiglitazone, pioglitazone ?certain medicines for high cholesterol like atorvastatin, pravastatin,  rosuvastatin, simvastatin ?charcoal ?cholestyramine ?ciprofloxacin ?duloxetine ?furosemide ?ketoprofen ?live virus vaccines ?medicines that increase your risk for infection ?methotrexate ?mitoxantrone ?paclitaxel ?penicillin ?theophylline ?tizanidine ?warfarin ?This list may not describe all possible interactions. Give your health care provider a list of all the medicines, herbs, non-prescription drugs, or dietary supplements you use. Also tell them if you smoke, drink alcohol, or use illegal drugs. Some items may interact with your medicine. ?What should I watch for while using this medication? ?Visit your health care provider for regular checks on your progress. Tell your doctor or health care provider if your symptoms do not start to get better or if they get worse. You may need blood work done while you are taking this medicine. ?This medicine may cause serious skin reactions. They can happen weeks to months after starting the medicine. Contact your health care provider right away if you notice fevers or flu-like symptoms with a rash. The rash may be red or purple and then turn into blisters or peeling of the skin. Or, you might notice a red rash with swelling of the face, lips or lymph nodes in your neck or under your arms. ?This medicine may stay in your body for up to 2 years after your last dose. Tell your doctor about any unusual side effects or symptoms. A medicine can be given to help lower your blood levels of this medicine more quickly. ?Women must use effective birth control with this medicine. There is a potential for serious side effects to an unborn child. Do not become pregnant while taking this medicine. Inform your doctor if you wish to become pregnant. This medicine remains in your blood after you stop taking it. You must continue using effective birth control until the blood levels have been checked and they are low enough. A medicine can be given to help lower your blood levels of this  medicine more quickly. Immediately talk to your doctor if you think you may be pregnant. You may need a pregnancy test. Talk to your health care provider or pharmacist for more information. ?You should not receive certain vaccines during your treatment and for a certain time after your treatment with this medication ends. Talk to your health care provider for more information. ?What side effects may I notice from receiving this medication? ?Side effects that you should report to your doctor or health care professional as soon as possible: ?allergic reactions like skin rash, itching or hives, swelling of the face, lips, or tongue ?breathing problems ?cough ?increased blood pressure ?low blood counts - this medicine may decrease the number of white blood cells and platelets. You may be at increased risk for infections and bleeding. ?pain, tingling, numbness in the hands or feet ?rash, fever, and swollen lymph nodes ?redness, blistering, peeing or loosening of the skin, including inside the mouth ?signs of decreased platelets or bleeding - bruising, pinpoint red spots on the skin, black, tarry stools, blood in urine ?signs of infection - fever or chills, cough, sore throat, pain or trouble passing urine ?signs and symptoms of liver injury like dark yellow or brown urine; general ill feeling or flu-like symptoms; light-colored stools; loss of appetite; nausea; right upper belly  pain; unusually weak or tired; yellowing of the eyes or skin ?trouble passing urine or change in the amount of urine ?vomiting ?Side effects that usually do not require medical attention (report to your doctor or health care professional if they continue or are bothersome): ?diarrhea ?hair thinning or loss ?headache ?nausea ?tiredness ?This list may not describe all possible side effects. Call your doctor for medical advice about side effects. You may report side effects to FDA at 1-800-FDA-1088. ?Where should I keep my medication? ?Keep out of  the reach of children. ?Store at room temperature between 15 and 30 degrees C (59 and 86 degrees F). Protect from moisture and light. Throw away any unused medicine after the expiration date. ?NOTE: This sheet is a sum

## 2021-08-31 NOTE — Progress Notes (Signed)
Pharmacy Note ? ?Subjective: ?Patient presents today to the [facility] for follow up office visit.  Patient seen by the pharmacist for counseling on leflunomide Jolee Ewing) for inflammatory arthritis. ? ?Objective: ?CBC ?   ?Component Value Date/Time  ? WBC 4.7 08/27/2021 0831  ? WBC 8.0 07/26/2021 0833  ? RBC 4.34 08/27/2021 0831  ? HGB 13.3 08/27/2021 0831  ? HGB 14.1 06/04/2020 1003  ? HGB 15.1 06/06/2017 1316  ? HCT 39.6 08/27/2021 0831  ? HCT 42.1 06/04/2020 1003  ? HCT 43.2 06/06/2017 1316  ? PLT 229 08/27/2021 0831  ? PLT 184 06/04/2020 1003  ? MCV 91.2 08/27/2021 0831  ? MCV 93 06/04/2020 1003  ? MCV 87 06/06/2017 1316  ? MCH 30.6 08/27/2021 0831  ? MCHC 33.6 08/27/2021 0831  ? RDW 13.4 08/27/2021 0831  ? RDW 14.2 06/04/2020 1003  ? RDW 12.6 06/06/2017 1316  ? LYMPHSABS 0.8 08/27/2021 0831  ? LYMPHSABS 0.8 06/04/2020 1003  ? LYMPHSABS 2.2 06/06/2017 1316  ? MONOABS 0.5 08/27/2021 0831  ? EOSABS 0.1 08/27/2021 0831  ? EOSABS 0.2 06/04/2020 1003  ? EOSABS 0.2 06/06/2017 1316  ? BASOSABS 0.0 08/27/2021 0831  ? BASOSABS 0.0 06/04/2020 1003  ? BASOSABS 0.0 06/06/2017 1316  ? ? ?CMP  ?   ?Component Value Date/Time  ? NA 138 08/27/2021 0831  ? NA 138 06/04/2020 1003  ? NA 145 06/06/2017 1316  ? NA 141 10/16/2015 1334  ? K 3.9 08/27/2021 0831  ? K 4.1 06/06/2017 1316  ? K 3.9 10/16/2015 1334  ? CL 99 08/27/2021 0831  ? CL 108 06/06/2017 1316  ? CO2 29 08/27/2021 0831  ? CO2 26 06/06/2017 1316  ? CO2 21 (L) 10/16/2015 1334  ? GLUCOSE 107 (H) 08/27/2021 0831  ? GLUCOSE 77 06/06/2017 1316  ? BUN 19 08/27/2021 0831  ? BUN 17 06/04/2020 1003  ? BUN 19 06/06/2017 1316  ? BUN 20.0 10/16/2015 1334  ? CREATININE 1.16 08/27/2021 0831  ? CREATININE 1.0 06/06/2017 1316  ? CREATININE 1.0 10/16/2015 1334  ? CALCIUM 9.6 08/27/2021 0831  ? CALCIUM 9.4 06/06/2017 1316  ? CALCIUM 9.7 10/16/2015 1334  ? PROT 7.7 08/27/2021 0831  ? PROT 7.0 06/04/2020 1003  ? PROT 7.0 06/06/2017 1316  ? PROT 6.8 10/16/2015 1334  ? ALBUMIN 4.1  08/27/2021 0831  ? ALBUMIN 4.6 06/04/2020 1003  ? ALBUMIN 4.5 12/09/2016 1352  ? ALBUMIN 3.9 10/16/2015 1334  ? AST 19 08/27/2021 0831  ? AST 16 10/16/2015 1334  ? ALT 18 08/27/2021 0831  ? ALT 27 06/06/2017 1316  ? ALT 11 10/16/2015 1334  ? ALKPHOS 70 08/27/2021 0831  ? ALKPHOS 78 06/06/2017 1316  ? ALKPHOS 62 10/16/2015 1334  ? BILITOT 0.8 08/27/2021 0831  ? BILITOT 0.49 10/16/2015 1334  ? GFRNONAA >60 08/27/2021 0831  ? GFRAA 105 06/04/2020 1003  ? GFRAA >60 02/20/2020 0753  ? ? ?Baseline Immunosuppressant Therapy Labs ?Quantiferon TB Gold Latest Ref Rng & Units 03/25/2020  ?Quantiferon TB Gold Plus NEGATIVE NEGATIVE  ? ? ?Hepatitis Latest Ref Rng & Units 03/25/2020  ?Hep B Surface Ag NON-REACTI NON-REACTIVE  ?Hep B IgM NON-REACTI NON-REACTIVE  ?Hep C Ab NON-REACTI NON-REACTIVE  ?Hep C Ab NON-REACTI NON-REACTIVE  ? ? ?Lab Results  ?Component Value Date  ? HIV NON REACTIVE 08/26/2019  ? HIV Non Reactive 03/06/2015  ? HIV Non Reactive 02/12/2015  ? ? ?Immunoglobulin Electrophoresis Latest Ref Rng & Units 03/25/2020  ?IgA  47 - 310  mg/dL 115  ?IgG 600 - 1,640 mg/dL 1,046  ?IgM 50 - 300 mg/dL 64  ? ? ?Serum Protein Electrophoresis Latest Ref Rng & Units 08/27/2021  ?Total Protein 6.5 - 8.1 g/dL 7.7  ?Albumin 3.8 - 4.8 g/dL -  ?Alpha-1 0.2 - 0.3 g/dL -  ?Alpha-2 0.5 - 0.9 g/dL -  ?Beta Globulin 0.4 - 0.6 g/dL -  ?Beta 2 0.2 - 0.5 g/dL -  ?Gamma Globulin 0.8 - 1.7 g/dL -  ? ? ?Lab Results  ?Component Value Date  ? G6PDH 14.1 03/25/2020  ? ? ?Lab Results  ?Component Value Date  ? TPMT 14 03/25/2020  ?  ? ?Assessment/Plan: ? ?Patient was counseled on the purpose, proper use, and adverse effects of leflunomide including risk of infection, nausea/diarrhea/weight loss, increase in blood pressure, rash, hair loss, tingling in the hands and feet, and signs and symptoms of interstitial lung disease.   Also counseled on Black Box warning of liver injury and importance of avoiding alcohol while on therapy. Discussed that there is  the possibility of an increased risk of malignancy but it is not well understood if this increased risk is due to the medication or the disease state.  Counseled patient to avoid live vaccines. Recommend annual influenza, Pneumovax 23, Prevnar 13, and Shingrix as indicated.  ? ?Discussed the importance of frequent monitoring of liver function and blood count.  Standing orders placed.  Discussed importance of birth control while on leflunomide due to risk of congenital abnormalities, and patient confirms his wife is post-menopausal.  Provided patient with educational materials on leflunomide and answered all questions.  Patient consented to [place] use, and consent will be uploaded into the media tab.   ? ?Patient dose will be '10mg'$  daily x 2 weeks. Repeat labs. If labs stable, increase to '20mg'$  daily.  Prescription pending lab results and clearance from Dr. Marin Olp (oncology) to proceed ? ?James Byrd, PharmD, MPH, BCPS ?Clinical Pharmacist (Rheumatology and Pulmonology) ? ?

## 2021-09-01 ENCOUNTER — Encounter: Payer: Self-pay | Admitting: Hematology & Oncology

## 2021-09-01 ENCOUNTER — Other Ambulatory Visit: Payer: Self-pay

## 2021-09-01 MED ORDER — LEFLUNOMIDE 10 MG PO TABS: ORAL_TABLET | ORAL | 0 refills | Status: DC

## 2021-09-01 NOTE — Progress Notes (Unsigned)
Advised patient's wife that prescription is being sent to the pharmacy. She requested that the prescription is sent to [facility] in [place].  ?

## 2021-09-02 ENCOUNTER — Encounter (HOSPITAL_COMMUNITY): Payer: Self-pay | Admitting: Emergency Medicine

## 2021-09-02 ENCOUNTER — Inpatient Hospital Stay: Payer: PPO

## 2021-09-02 ENCOUNTER — Other Ambulatory Visit: Payer: Self-pay | Admitting: *Deleted

## 2021-09-02 ENCOUNTER — Other Ambulatory Visit: Payer: Self-pay

## 2021-09-02 VITALS — BP 115/68 | HR 87 | Temp 98.4°F | Resp 18

## 2021-09-02 DIAGNOSIS — C4371 Malignant melanoma of right lower limb, including hip: Secondary | ICD-10-CM | POA: Diagnosis not present

## 2021-09-02 MED ORDER — SODIUM CHLORIDE 0.9 % IV SOLN: Freq: Once | INTRAVENOUS | Status: AC

## 2021-09-02 MED ORDER — SODIUM CHLORIDE 0.9 % IV SOLN
1000.0000 mg | Freq: Once | INTRAVENOUS | Status: AC
  Administered 2021-09-02: 1000 mg via INTRAVENOUS
  Filled 2021-09-02: qty 10

## 2021-09-02 NOTE — Progress Notes (Signed)
PA submitted for fentanyl patches 25 mcg.  Awaiting approval. ?

## 2021-09-03 NOTE — Progress Notes (Signed)
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 ?Location(s) of Symptomatic tumor(s): abdomin ? ?Past/Anticipated chemotherapy by medical oncology, if any: Dr Marin Olp ?Past Therapy:        ?Yervoy-status post 3 cycles - discontinued in September 2016 secondary to toxicity ?Somatuline 120 mg IM q month -- started on 05/22/2019 - d/c on 08/2019 ?XRT to the RIGHT inguinal basin ?  ?Current Therapy:   ?Nivolumab 480 mg q month -- started on 03/19/2021, s/p cycle #4 ?IV Iron as indicated  ? ?Patient's main complaints related to symptomatic tumor(s) are: abdominal pain ? ?Pain on a scale of 0-10 is: 4/10  ? ? ?If Spine Met(s), symptoms, if any, include: ?Bowel/Bladder retention or incontinence (please describe): urinary urgency with incomplete bladder emptying ?Numbness or weakness in extremities (please describe): weakness due to neuropathy in legs, numbness in hands ?Current Decadron regimen, if applicable: none ? ?Ambulatory status? Walker? Wheelchair?: ambulatory ? ?SAFETY ISSUES: ?Prior radiation?  ?12/29/20 - 01/08/21: The right common iliac lymph node and a left gluteal nodule were treated to 50 Gy in 5 fractions of 10 Gy ?  ?12/27/2018 through 01/29/2019 ?Site Technique Total Dose Dose per Fx Completed Fx Beam Energies  ?Extremity: Ext_Rt_rt inguinal       ? ?Pacemaker/ICD? No ?Possible current pregnancy? no ?Is the patient on methotrexate? no ? ?Additional Complaints / other details:  low back pain ? ?Vitals:  ? 09/07/21 1034  ?BP: 122/75  ?Pulse: 64  ?Resp: 18  ?Temp: (!) 96.9 ?F (36.1 ?C)  ?TempSrc: Temporal  ?SpO2: 97%  ?Weight: 263 lb 8 oz (119.5 kg)  ?Height: 6' (1.829 m)  ? ? ?

## 2021-09-06 NOTE — Progress Notes (Incomplete)
?Radiation Oncology         [phone] ?________________________________ ? ?Outpatient Re-Consultation ? ?Name: James Byrd MRN: [id number]  ?Date: 09/07/2021  DOB: Jul 22, 1965 ? ?LK:[id number], Sharon Mt, MD  James Napoleon, MD  ? ?REFERRING PHYSICIAN: [name], MD ? ?DIAGNOSIS: The primary encounter diagnosis was Malignant melanoma of right great toe Stage IIIB ([id number]). Diagnoses of Neuroendocrine carcinoma of pancreas ([place]) and Metastatic melanoma to lymph node [facility]) were also pertinent to this visit. ? ?Stage IIIB ([id number]) subungual melanoma of the right hallux; nodal recurrence in the right inguinal nodes; BRAF with right common iliac node recurrence; progression of melanoma-lymph node metastasis 02/2021 ? ?Neuroendocrine carcinoma of the pancreas ? ?Interval since last radiation: about 8 months  ? ?Radiation treatment dates: ? ?12/29/20 - 01/08/21: The right common iliac lymph node and a left gluteal nodule were treated to 50 Gy in 5 fractions of 10 Gy (Dr. Tammi Klippel) ? ? ?Right inguinal lymph nodes : 12/27/2018 through 01/29/2019  ?Site Technique Total Dose Dose per Fx Completed Fx Beam Energies  ?Extremity: Ext_Rt_rt ingu 3D 48/48 2.4 20/20 6X, 10X  ?  ? ? ?HISTORY OF PRESENT ILLNESS::James Byrd is a 56 y.o. male who is accompanied by ***. he is seen as a courtesy of Dr. Marin Byrd for re-evaluation and an opinion concerning palliative radiation therapy as part of management for his recent progression of metastatic melanoma. I last met with the patient in 2020 for follow-up following RT directed to the site of disease recurrence in the right inguinal area. This was also resected. He had another recurrence in the right iliac lymph node and left gluteal nodule in June 2022 which was treated with radiation under Dr. Tammi Klippel. Since then, he has maintained care under Dr. Marin Byrd. His recent history of recurrence is as follows.  ? ?PET on 03/11/21 showed an interval mixed response to therapy, defined  by: a new enlarged and FDG avid left retroperitoneal lymph node, and a new FDG avid left external iliac lymph node compatible with new lymph node metastasis. Resolution of FDG uptake was seen in a previously enlarged right common iliac lymph nodes, and a stable to slight decrease in FDG uptake was seen associated with a left gluteal region soft tissue nodule (also unchanged in size).  ? ?Given progression of melanoma and lymph node metastasis, the patient met with Dr. Marin Byrd and accordingly began immunotherapy consisting of Nivolumab on 03/19/21 . Completed on 06/15/21 (s/p 4 cycles).  ? ?PET on 07/14/21 showed a substantial increase in size and activity of abdominal and pelvic lymph nodes (large right external iliac lymph node in particular was appreciated to measure 6.3 cm). PET also showed a decrease in size and activity of the small nodule along the lateral left gluteus muscle. ? ?Biopsy of the left external left iliac chain lymph node on 07/26/21 showed malignant neoplasm consistent with metastatic melanoma. (MS-stable, C-kit mutation negative). (Of note: given his negative c-kit mutation, Dr. Marin Byrd referred the patient to Dr. Dwyane Byrd at [facility] for consideration of clinical trials).  ? ?Per his most recent follow-up with Dr. Marin Byrd on 08/27/21, the patient was noted to have clearly declined, and reported multifocal pain (rheumatoid arthritis, left testicular pain). Dr. Marin Byrd noted that further chemotherapy could be considered for the patient and possibly the intratumoral vaccine (though this apparently may be somewhat difficult to administer because of the death of the lymph nodes).  ? ?Pertinent imaging this far includes:  ?-- MRI of the brain on 08/12/21  which demonstrated no evidence of metastatic disease or acute intracranial abnormalities.  ?-- Scrotal US on 08/28/21 (prompted due to reports of testicular pain on 08/27/21) showed no evidence of epididymitis/orchitis or testicular torsion, and  no explanation for the patient's left-sided testicular pain. ? ?Other past therapies:        ?--Yervoy-status post 3 cycles - discontinued in September 2016 secondary to toxicity ?--Somatuline 120 mg IM q month -- started on 05/22/2019 - d/c on 08/2019 ?--IV Iron -- Iron Dextran given on 09/18/2019 ? ? ?PAST MEDICAL HISTORY:  ?Past Medical History:  ?Diagnosis Date  ? Adrenal insufficiency ([place])   ? Anxiety 05/24/2012  ? Complication of anesthesia   ? SLO TO AWAKEN MANY 8 TO 9 YRS AGO, NO ISSUES SINCE  ? Goals of care, counseling/discussion 12/17/2018  ? Gout   ? History of kidney stones   ? HLD (hyperlipidemia)   ? Malignant melanoma of right great toe ([place]) 11/14/2014  ? Neuroendocrine carcinoma of pancreas ([place]) 05/22/2019  ? Paroxysmal supraventricular tachycardia ([place]) 05/24/2012  ? Described in the treadmill report; details are pending   ? PE (pulmonary thromboembolism) ([place]) 08/2019  ? Rheumatoid arthritis ([place])   ? Sleep apnea   ? MILD NO CPAP NEEDED  ? ? ?PAST SURGICAL HISTORY: ?Past Surgical History:  ?Procedure Laterality Date  ? CHOLECYSTECTOMY    ? ESOPHAGOGASTRODUODENOSCOPY (EGD) WITH PROPOFOL N/A 05/13/2019  ? Procedure: ESOPHAGOGASTRODUODENOSCOPY (EGD) WITH PROPOFOL;  Surgeon: Arta Silence, MD;  Location: [facility] ENDOSCOPY;  Service: Endoscopy;  Laterality: N/A;  ? EXTRACORPOREAL SHOCK WAVE LITHOTRIPSY  2016  ? FINE NEEDLE ASPIRATION N/A 05/13/2019  ? Procedure: FINE NEEDLE ASPIRATION (FNA) LINEAR;  Surgeon: Arta Silence, MD;  Location: [facility] END[facility];  Service: Endoscopy;  Laterality: N/A;  ? LYMPH NODE BIOPSY Right 11/06/2018  ? Procedure: RIGHT INGUINAL LYMPH NODE BIOPSY AND POSSIBLE RIGHT GROIN NODE DISECTION;  Surgeon: Alphonsa Overall, MD;  Location: [facility] ORS;  Service: General;  Laterality: Right;  ? r toe partial ambutation    ? ROTATOR CUFF REPAIR Right 2010  ? 3 yrs ago  ? UPPER ESOPHAGEAL ENDOSCOPIC ULTRASOUND (EUS) N/A 05/13/2019  ? Procedure: UPPER ESOPHAGEAL ENDOSCOPIC ULTRASOUND (EUS);  Surgeon:  Arta Silence, MD;  Location: [facility];  Service: Endoscopy;  Laterality: N/A;  ? ? ?FAMILY HISTORY:  ?Family History  ?Adopted: Yes  ?Problem Relation Age of Onset  ? Stroke Mother   ? Breast cancer Mother   ? COPD Father   ? Diabetes Father   ? Dementia Father   ? Diabetes Sister   ? ? ?SOCIAL HISTORY:  ?Social History  ? ?Tobacco Use  ? Smoking status: Never  ?  Passive exposure: Never  ? Smokeless tobacco: Never  ?Vaping Use  ? Vaping Use: Never used  ?Substance Use Topics  ? Alcohol use: No  ?  Alcohol/week: 0.0 standard drinks  ? Drug use: No  ? ? ?ALLERGIES: No Known Allergies ? ?MEDICATIONS:  ?Current Outpatient Medications  ?Medication Sig Dispense Refill  ? acetaminophen (TYLENOL) 650 MG CR tablet Take 1,300 mg by mouth daily as needed for pain.    ? Ascorbic Acid (VITAMIN C) 1000 MG tablet Take 1,000 mg by mouth daily.    ? augmented betamethasone dipropionate (DIPROLENE-AF) 0.05 % cream Apply 1 application topically 2 (two) times daily as needed (psoriasis).     ? BLACK CURRANT SEED OIL PO Take 2 tablets by mouth daily.    ? Cholecalciferol (VITAMIN D) 2000 units tablet Take 2,000 Units by  mouth daily.    ? cyanocobalamin 2000 MCG tablet Take 2,000 mcg by mouth daily. Vitamin b12    ? diclofenac sodium (VOLTAREN) 1 % GEL Apply 2 g topically daily as needed (pain).     ? fentaNYL (DURAGESIC) 25 MCG/HR Place 1 patch onto the skin every 3 (three) days. 10 patch 0  ? folic acid (FOLVITE) 1 MG tablet TAKE TWO TABLETS BY MOUTH ONCE DAILY 180 tablet 2  ? hydrocortisone (CORTEF) 5 MG tablet Take 5 mg by mouth 3 (three) times daily.    ? leflunomide (ARAVA) 10 MG tablet Take 51m po qd x 2 weeks, if labs are stable, increase to 220mpo qd. 42 tablet 0  ? levofloxacin (LEVAQUIN) 750 MG tablet Take 750 mg by mouth daily.    ? Melatonin 10 MG TABS Take 10 mg by mouth at bedtime as needed.    ? Multiple Vitamin (MULTIVITAMIN WITH MINERALS) TABS tablet Take 1 tablet by mouth daily.    ? Omega-3 Fatty Acids (FISH  OIL PO) Take by mouth daily.    ? ondansetron (ZOFRAN-ODT) 8 MG disintegrating tablet Take 1 tablet (8 mg total) by mouth every 8 (eight) hours as needed for nausea or vomiting. 20 tablet 0  ? oxyCODONE

## 2021-09-07 ENCOUNTER — Encounter (HOSPITAL_COMMUNITY): Payer: Self-pay | Admitting: Hematology & Oncology

## 2021-09-07 ENCOUNTER — Encounter: Payer: Self-pay | Admitting: Radiation Oncology

## 2021-09-07 ENCOUNTER — Other Ambulatory Visit: Payer: Self-pay

## 2021-09-07 ENCOUNTER — Ambulatory Visit
Admission: RE | Admit: 2021-09-07 | Discharge: 2021-09-07 | Disposition: A | Discharge status: Home / Self Care | Payer: PPO | Source: Ambulatory Visit | Attending: Radiation Oncology | Admitting: Radiation Oncology

## 2021-09-07 VITALS — BP 122/75 | HR 64 | Temp 96.9°F | Resp 18 | Ht 72.0 in | Wt 263.5 lb

## 2021-09-07 DIAGNOSIS — Z86711 Personal history of pulmonary embolism: Secondary | ICD-10-CM | POA: Diagnosis not present

## 2021-09-07 DIAGNOSIS — Z79899 Other long term (current) drug therapy: Secondary | ICD-10-CM | POA: Insufficient documentation

## 2021-09-07 DIAGNOSIS — E785 Hyperlipidemia, unspecified: Secondary | ICD-10-CM | POA: Insufficient documentation

## 2021-09-07 DIAGNOSIS — C4371 Malignant melanoma of right lower limb, including hip: Secondary | ICD-10-CM | POA: Diagnosis present

## 2021-09-07 DIAGNOSIS — C774 Secondary and unspecified malignant neoplasm of inguinal and lower limb lymph nodes: Secondary | ICD-10-CM | POA: Insufficient documentation

## 2021-09-07 DIAGNOSIS — G4730 Sleep apnea, unspecified: Secondary | ICD-10-CM | POA: Insufficient documentation

## 2021-09-07 DIAGNOSIS — N50812 Left testicular pain: Secondary | ICD-10-CM | POA: Insufficient documentation

## 2021-09-07 DIAGNOSIS — R600 Localized edema: Secondary | ICD-10-CM | POA: Insufficient documentation

## 2021-09-07 DIAGNOSIS — C779 Secondary and unspecified malignant neoplasm of lymph node, unspecified: Secondary | ICD-10-CM

## 2021-09-07 DIAGNOSIS — Z510 Encounter for antineoplastic radiation therapy: Secondary | ICD-10-CM | POA: Diagnosis not present

## 2021-09-07 DIAGNOSIS — Z803 Family history of malignant neoplasm of breast: Secondary | ICD-10-CM | POA: Insufficient documentation

## 2021-09-07 DIAGNOSIS — C7A1 Malignant poorly differentiated neuroendocrine tumors: Secondary | ICD-10-CM | POA: Insufficient documentation

## 2021-09-07 DIAGNOSIS — Z791 Long term (current) use of non-steroidal anti-inflammatories (NSAID): Secondary | ICD-10-CM | POA: Insufficient documentation

## 2021-09-07 DIAGNOSIS — M069 Rheumatoid arthritis, unspecified: Secondary | ICD-10-CM | POA: Diagnosis not present

## 2021-09-07 DIAGNOSIS — Z87442 Personal history of urinary calculi: Secondary | ICD-10-CM | POA: Insufficient documentation

## 2021-09-07 DIAGNOSIS — C7A8 Other malignant neuroendocrine tumors: Secondary | ICD-10-CM

## 2021-09-07 NOTE — Progress Notes (Signed)
See MD note for nursing evaluation. °

## 2021-09-08 ENCOUNTER — Encounter: Payer: Self-pay | Admitting: Hematology & Oncology

## 2021-09-08 NOTE — Telephone Encounter (Signed)
Spoke with Hoyle Sauer and pt who were wanting to speak with Dr Marin Olp further about Dr Collicho's plan to remove the abdominal tumor and hold radiation for a period of time. Dr Marin Olp agreed to call pt to answer questions regarding this. Pt is aware. ?

## 2021-09-08 NOTE — Progress Notes (Signed)
?Radiation Oncology         (336) [phone] ?________________________________ ? ?Outpatient Re-Consultation ? ?Name: James Byrd MRN: [id number]  ?Date: 09/07/2021  DOB: 1966/05/06 ? ?YD:XAJOIN, James Mt, MD  [name], MD  ? ?REFERRING PHYSICIAN: [name], MD ? ?DIAGNOSIS: The primary encounter diagnosis was Malignant melanoma of right great toe Stage IIIB ([id number]). Diagnoses of Neuroendocrine carcinoma of pancreas ([place]) and Metastatic melanoma to lymph node [facility]) were also pertinent to this visit. ? ?Stage IIIB ([id number]) subungual melanoma of the right hallux; nodal recurrence in the right inguinal nodes; BRAF with right common iliac node recurrence; progression of melanoma-lymph node metastasis 02/2021 ? ?Neuroendocrine carcinoma of the pancreas ? ?Interval since last radiation: about 8 months  ? ?Radiation treatment dates: ? ?12/29/20 - 01/08/21: The right common iliac lymph node and a left gluteal nodule were treated to 50 Gy in 5 fractions of 10 Gy (Dr. Tammi Klippel) ? ? ?Right inguinal lymph nodes : 12/27/2018 through 01/29/2019  ?Site Technique Total Dose Dose per Fx Completed Fx Beam Energies  ?Extremity: Ext_Rt_rt ingu 3D 48/48 2.4 20/20 6X, 10X  ?  ? ? ?HISTORY OF PRESENT ILLNESS::James Byrd is a 56 y.o. male who is accompanied by his wife. he is seen as a courtesy of Dr. Marin Olp for re-evaluation and an opinion concerning palliative radiation therapy as part of management for his recent progression of metastatic melanoma. I last met with the patient in 2020 for follow-up following RT directed to the site of disease recurrence in the right inguinal area. This was also resected. He had another recurrence in the right iliac lymph node and left gluteal nodule in June 2022 which was treated with radiation under Dr. Tammi Klippel. Since then, he has maintained care under Dr. Marin Olp. His recent history of recurrence is as follows.  ? ?PET on 03/11/21 showed an interval mixed response to therapy,  defined by: a new enlarged and FDG avid left retroperitoneal lymph node, and a new FDG avid left external iliac lymph node compatible with new lymph node metastasis. Resolution of FDG uptake was seen in a previously enlarged right common iliac lymph nodes, and a stable to slight decrease in FDG uptake was seen associated with a left gluteal region soft tissue nodule (also unchanged in size).  ? ?Given progression of melanoma and lymph node metastasis, the patient met with Dr. Marin Olp and accordingly began immunotherapy consisting of Nivolumab on 03/19/21 . Completed on 06/15/21 (s/p 4 cycles).  ? ?PET on 07/14/21 showed a substantial increase in size and activity of abdominal and pelvic lymph nodes (large right external iliac lymph node in particular was appreciated to measure 6.3 cm). PET also showed a decrease in size and activity of the small nodule along the lateral left gluteus muscle. ? ?Biopsy of the left external left iliac chain lymph node on 07/26/21 showed malignant neoplasm consistent with metastatic melanoma. (MS-stable, C-kit mutation negative). (Of note: given his negative c-kit mutation, Dr. Marin Olp referred the patient to Dr. Dwyane Luo at [facility] for consideration of clinical trials).  ? ?Per his most recent follow-up with Dr. Marin Olp on 08/27/21, the patient was noted to have clearly declined, and reported multifocal pain (rheumatoid arthritis, left testicular pain). Dr. Marin Olp noted that further chemotherapy could be considered for the patient and possibly the intratumoral vaccine (though this apparently may be somewhat difficult to administer because of the death of the lymph nodes).  ? ?Pertinent imaging this far includes:  ?-- MRI of the brain on  08/12/21 which demonstrated no evidence of metastatic disease or acute intracranial abnormalities.  ?-- Scrotal US on 08/28/21 (prompted due to reports of testicular pain on 08/27/21) showed no evidence of epididymitis/orchitis or testicular  torsion, and no explanation for the patient's left-sided testicular pain. ? ?Other past therapies:        ?--Yervoy-status post 3 cycles - discontinued in September 2016 secondary to toxicity ?--Somatuline 120 mg IM q month -- started on 05/22/2019 - d/c on 08/2019 ?--IV Iron -- Iron Dextran given on 09/18/2019 ? ? ?PAST MEDICAL HISTORY:  ?Past Medical History:  ?Diagnosis Date  ? Adrenal insufficiency ([place])   ? Anxiety 05/24/2012  ? Complication of anesthesia   ? SLO TO AWAKEN MANY 8 TO 9 YRS AGO, NO ISSUES SINCE  ? Goals of care, counseling/discussion 12/17/2018  ? Gout   ? History of kidney stones   ? HLD (hyperlipidemia)   ? Malignant melanoma of right great toe ([place]) 11/14/2014  ? Neuroendocrine carcinoma of pancreas ([place]) 05/22/2019  ? Paroxysmal supraventricular tachycardia ([place]) 05/24/2012  ? Described in the treadmill report; details are pending   ? PE (pulmonary thromboembolism) ([place]) 08/2019  ? Rheumatoid arthritis ([place])   ? Sleep apnea   ? MILD NO CPAP NEEDED  ? ? ?PAST SURGICAL HISTORY: ?Past Surgical History:  ?Procedure Laterality Date  ? CHOLECYSTECTOMY    ? ESOPHAGOGASTRODUODENOSCOPY (EGD) WITH PROPOFOL N/A 05/13/2019  ? Procedure: ESOPHAGOGASTRODUODENOSCOPY (EGD) WITH PROPOFOL;  Surgeon: Arta Silence, MD;  Location: [facility];  Service: Endoscopy;  Laterality: N/A;  ? EXTRACORPOREAL SHOCK WAVE LITHOTRIPSY  2016  ? FINE NEEDLE ASPIRATION N/A 05/13/2019  ? Procedure: FINE NEEDLE ASPIRATION (FNA) LINEAR;  Surgeon: Arta Silence, MD;  Location: [facility];  Service: Endoscopy;  Laterality: N/A;  ? LYMPH NODE BIOPSY Right 11/06/2018  ? Procedure: RIGHT INGUINAL LYMPH NODE BIOPSY AND POSSIBLE RIGHT GROIN NODE DISECTION;  Surgeon: Alphonsa Overall, MD;  Location: [facility] OR[facility];  Service: General;  Laterality: Right;  ? r toe partial ambutation    ? ROTATOR CUFF REPAIR Right 2010  ? 3 yrs ago  ? UPPER ESOPHAGEAL ENDOSCOPIC ULTRASOUND (EUS) N/A 05/13/2019  ? Procedure: UPPER ESOPHAGEAL ENDOSCOPIC ULTRASOUND  (EUS);  Surgeon: Arta Silence, MD;  Location: [facility];  Service: Endoscopy;  Laterality: N/A;  ? ? ?FAMILY HISTORY:  ?Family History  ?Adopted: Yes  ?Problem Relation Age of Onset  ? Stroke Mother   ? Breast cancer Mother   ? COPD Father   ? Diabetes Father   ? Dementia Father   ? Diabetes Sister   ? ? ?SOCIAL HISTORY:  ?Social History  ? ?Tobacco Use  ? Smoking status: Never  ?  Passive exposure: Never  ? Smokeless tobacco: Never  ?Vaping Use  ? Vaping Use: Never used  ?Substance Use Topics  ? Alcohol use: No  ?  Alcohol/week: 0.0 standard drinks  ? Drug use: No  ? ? ?ALLERGIES: No Known Allergies ? ?MEDICATIONS:  ?Current Outpatient Medications  ?Medication Sig Dispense Refill  ? acetaminophen (TYLENOL) 650 MG CR tablet Take 1,300 mg by mouth daily as needed for pain.    ? Ascorbic Acid (VITAMIN C) 1000 MG tablet Take 1,000 mg by mouth daily.    ? augmented betamethasone dipropionate (DIPROLENE-AF) 0.05 % cream Apply 1 application topically 2 (two) times daily as needed (psoriasis).     ? BLACK CURRANT SEED OIL PO Take 2 tablets by mouth daily.    ? Cholecalciferol (VITAMIN D) 2000 units tablet Take 2,000 Units  by mouth daily.    ? cyanocobalamin 2000 MCG tablet Take 2,000 mcg by mouth daily. Vitamin b12    ? diclofenac sodium (VOLTAREN) 1 % GEL Apply 2 g topically daily as needed (pain).     ? DULoxetine (CYMBALTA) 20 MG capsule Take 20 mg by mouth daily.    ? folic acid (FOLVITE) 1 MG tablet TAKE TWO TABLETS BY MOUTH ONCE DAILY 180 tablet 2  ? hydrocortisone (CORTEF) 5 MG tablet Take 5 mg by mouth 3 (three) times daily.    ? leflunomide (ARAVA) 10 MG tablet Take 61m po qd x 2 weeks, if labs are stable, increase to 234mpo qd. 42 tablet 0  ? levofloxacin (LEVAQUIN) 750 MG tablet Take 750 mg by mouth daily.    ? Melatonin 10 MG TABS Take 10 mg by mouth at bedtime as needed.    ? Multiple Vitamin (MULTIVITAMIN WITH MINERALS) TABS tablet Take 1 tablet by mouth daily.    ? Omega-3 Fatty Acids (FISH OIL PO)  Take by mouth daily.    ? ondansetron (ZOFRAN-ODT) 8 MG disintegrating tablet Take 1 tablet (8 mg total) by mouth every 8 (eight) hours as needed for nausea or vomiting. 20 tablet 0  ? [place]

## 2021-09-14 ENCOUNTER — Inpatient Hospital Stay (HOSPITAL_BASED_OUTPATIENT_CLINIC_OR_DEPARTMENT_OTHER): Payer: PPO | Admitting: Hematology & Oncology

## 2021-09-14 ENCOUNTER — Inpatient Hospital Stay: Payer: PPO

## 2021-09-14 ENCOUNTER — Encounter: Payer: Self-pay | Admitting: Family

## 2021-09-14 ENCOUNTER — Ambulatory Visit: Payer: PPO

## 2021-09-14 ENCOUNTER — Other Ambulatory Visit: Payer: Self-pay

## 2021-09-14 VITALS — BP 121/86 | HR 67 | Temp 98.0°F | Wt 261.0 lb

## 2021-09-14 DIAGNOSIS — C4371 Malignant melanoma of right lower limb, including hip: Secondary | ICD-10-CM

## 2021-09-14 DIAGNOSIS — Z510 Encounter for antineoplastic radiation therapy: Secondary | ICD-10-CM | POA: Diagnosis not present

## 2021-09-14 DIAGNOSIS — C779 Secondary and unspecified malignant neoplasm of lymph node, unspecified: Secondary | ICD-10-CM

## 2021-09-14 LAB — CMP (CANCER CENTER ONLY)
ALT: 15 U/L (ref 0–44)
AST: 16 U/L (ref 15–41)
Albumin: 4.6 g/dL (ref 3.5–5.0)
Alkaline Phosphatase: 53 U/L (ref 38–126)
Anion gap: 9 (ref 5–15)
BUN: 27 mg/dL — ABNORMAL HIGH (ref 6–20)
CO2: 25 mmol/L (ref 22–32)
Calcium: 10.2 mg/dL (ref 8.9–10.3)
Chloride: 104 mmol/L (ref 98–111)
Creatinine: 0.9 mg/dL (ref 0.61–1.24)
GFR, Estimated: >60 mL/min (ref >60)
Glucose, Bld: 86 mg/dL (ref 70–99)
Potassium: 3.8 mmol/L (ref 3.5–5.1)
Sodium: 138 mmol/L (ref 135–145)
Total Bilirubin: 0.3 mg/dL (ref 0.3–1.2)
Total Protein: 7.7 g/dL (ref 6.5–8.1)

## 2021-09-14 LAB — CBC WITH DIFFERENTIAL (CANCER CENTER ONLY)
Abs Immature Granulocytes: 0.1 10*3/uL — ABNORMAL HIGH (ref 0.00–0.07)
Basophils Absolute: 0 10*3/uL (ref 0.0–0.1)
Basophils Relative: 0 %
Eosinophils Absolute: 0 10*3/uL (ref 0.0–0.5)
Eosinophils Relative: 0 %
HCT: 39.5 % (ref 39.0–52.0)
Hemoglobin: 13.3 g/dL (ref 13.0–17.0)
Immature Granulocytes: 1 %
Lymphocytes Relative: 9 %
Lymphs Abs: 0.9 10*3/uL (ref 0.7–4.0)
MCH: 30.1 pg (ref 26.0–34.0)
MCHC: 33.7 g/dL (ref 30.0–36.0)
MCV: 89.4 fL (ref 80.0–100.0)
Monocytes Absolute: 0.7 10*3/uL (ref 0.1–1.0)
Monocytes Relative: 7 %
Neutro Abs: 7.9 10*3/uL — ABNORMAL HIGH (ref 1.7–7.7)
Neutrophils Relative %: 83 %
Platelet Count: 230 10*3/uL (ref 150–400)
RBC: 4.42 MIL/uL (ref 4.22–5.81)
RDW: 14.1 % (ref 11.5–15.5)
WBC Count: 9.6 10*3/uL (ref 4.0–10.5)
nRBC: 0 % (ref 0.0–0.2)

## 2021-09-14 LAB — LACTATE DEHYDROGENASE: LDH: 234 U/L — ABNORMAL HIGH (ref 98–192)

## 2021-09-14 MED ORDER — OXYCODONE-ACETAMINOPHEN 10-325 MG PO TABS: 1.0000 | ORAL_TABLET | ORAL | 0 refills | Status: DC | PRN

## 2021-09-14 NOTE — Progress Notes (Signed)
?Hematology and Oncology Follow Up Visit ? ?James Byrd ?[id number] ?20-Apr-1966 56 y.o. ?09/14/2021 ? ? ?Principle Diagnosis:  ?Stage IIIB ([id number]) subungual melanoma of the right hallux -- nodal recurrence in the RIGHT inguinal nodes -- BRAF wt ?RIGHT Common Iliac node recurrence ?Progression of melanoma-lymph node metastasis- 02/2021 ?Neuroendocrine carcinoma of the pancreas ?Iron def anemia ?  ?Past Therapy:        ?Yervoy-status post 3 cycles - discontinued in September 2016 secondary to toxicity ?Somatuline 120 mg IM q month -- started on 05/22/2019 - d/c on 08/2019 ?XRT to the RIGHT inguinal basin ? ?Current Therapy:   ?Nivolumab 480 mg q month -- started on 03/19/2021, s/p cycle #4 ?IV Iron as indicated  ?  ?Interim History:  James Byrd is here today for follow-up.  Unfortunately , James Byrd is not can be having surgery at [facility].  Apparently, the surgeon did not think that there is any indication for surgery for his right melanoma lesion.  It is quite large.  Is causing James Byrd some problems with back discomfort and urinary frequency. ? ?James Byrd is got have radiation therapy for some melanoma on the left abdomen and pelvis.  James Byrd will start this tomorrow.  I think James Byrd is 10 treatments. ? ?James Byrd is still having pain issues.  James Byrd has not taken the fentanyl patch.  His wife is little worried about the fentanyl patch.  I told her that this is time-released.  As such, James Byrd will not have the side effects that you read about.  This is not the same thing as the "street version" of fentanyl. ? ?James Byrd apparently is going see a Dealer.  This is to help with the pain in his left testicle.  We did get an ultrasound that did not show anything that was obvious in the left testicle.  I have to believe the pain is more referred pain. ? ?James Byrd still is trying his best.  James Byrd is doing his best to try to improve his status with the melanoma. ? ?I think that our only option for him would be chemotherapy.  I realize this is not done all that  much.  I know the chemotherapy has sort of lost favor with melanoma since we have targeted therapy and immunotherapy.  Unfortunate, his melanoma is "triple negative" and there really is nothing else that can be done for targeted or immunotherapy. ? ?I think that what would be reasonable would be to use dacarbazine.  I know this has some effectiveness with melanoma. ? ?Currently, I would have to say his performance status is ECOG 1.   ? ?James Byrd does have quite a few problems with his rheumatoid arthritis.  I think the rheumatologist to start him on a medication for this.  James Byrd has a lot of joint aches because of the rheumatism.    ? ?Medications:  ?Allergies as of 09/14/2021   ?No Known Allergies ?  ? ?  ?Medication List  ?  ? ?  ? Accurate as of September 14, 2021 12:43 PM. If you have any questions, ask your nurse or doctor.  ?  ?  ? ?  ? ?acetaminophen 650 MG CR tablet ?Commonly known as: TYLENOL ?Take 1,300 mg by mouth daily as needed for pain. ?  ?augmented betamethasone dipropionate 0.05 % cream ?Commonly known as: DIPROLENE-AF ?Apply 1 application topically 2 (two) times daily as needed (psoriasis). ?  ?BLACK CURRANT SEED OIL PO ?Take 2 tablets by mouth daily. ?  ?cyanocobalamin 2000 MCG tablet ?Take  2,000 mcg by mouth daily. Vitamin b12 ?  ?diclofenac sodium 1 % Gel ?Commonly known as: VOLTAREN ?Apply 2 g topically daily as needed (pain). ?  ?DULoxetine 20 MG capsule ?Commonly known as: CYMBALTA ?Take 20 mg by mouth daily. ?  ?fentaNYL 25 MCG/HR ?Commonly known as: [place] ?Place 1 patch onto the skin every 3 (three) days. ?  ?FISH OIL PO ?Take by mouth daily. ?  ?folic acid 1 MG tablet ?Commonly known as: FOLVITE ?TAKE TWO TABLETS BY MOUTH ONCE DAILY ?  ?hydrocortisone 5 MG tablet ?Commonly known as: CORTEF ?Take 5 mg by mouth 3 (three) times daily. ?  ?leflunomide 10 MG tablet ?Commonly known as: ARAVA ?Take 26m po qd x 2 weeks, if labs are stable, increase to 258mpo qd. ?  ?levofloxacin 750 MG tablet ?Commonly  known as: LEVAQUIN ?Take 750 mg by mouth daily. ?  ?Melatonin 10 MG Tabs ?Take 10 mg by mouth at bedtime as needed. ?  ?Methotrexate (PF) 25 MG/0.4ML Soaj ?Inject 0.8 mL SQ once weekly. ?  ?multivitamin with minerals Tabs tablet ?Take 1 tablet by mouth daily. ?  ?naloxone 4 MG/0.1ML Liqd nasal spray kit ?Commonly known as: NARCAN ?  ?ondansetron 8 MG disintegrating tablet ?Commonly known as: ZOFRAN-ODT ?Take 1 tablet (8 mg total) by mouth every 8 (eight) hours as needed for nausea or vomiting. ?  ?OVER THE COUNTER MEDICATION ?Take 0.5 each by mouth as needed. CBD gummies ?  ?oxyCODONE-acetaminophen 5-325 MG tablet ?Commonly known as: PERCOCET/ROXICET ?TAKE ONE TABLET BY MOUTH EVERY 8 HOURS AS NEEDED FOR SEVERE PAIN. ?  ?predniSONE 10 MG tablet ?Commonly known as: DELTASONE ?Take 1 tablet (10 mg total) by mouth daily with breakfast. ?  ?pregabalin 75 MG capsule ?Commonly known as: LYRICA ?Take 75 mg by mouth 2 (two) times daily. ?  ?pyridOXINE 100 MG tablet ?Commonly known as: VITAMIN B-6 ?Take 100 mg by mouth daily. ?  ?sildenafil 50 MG tablet ?Commonly known as: Viagra ?Take 1 tablet (50 mg total) by mouth daily as needed for erectile dysfunction. ?  ?vitamin C 1000 MG tablet ?Take 1,000 mg by mouth daily. ?  ?Vitamin D 50 MCG (2000 UT) tablet ?Take 2,000 Units by mouth daily. ?  ?zinc gluconate 50 MG tablet ?Take 50 mg by mouth daily. ?  ? ?  ? ? ?Allergies: No Known Allergies ? ?Past Medical History, Surgical history, Social history, and Family History were reviewed and updated. ? ?Review of Systems: ?Review of Systems  ?Constitutional: Negative.   ?HENT: Negative.    ?Eyes: Negative.   ?Respiratory: Negative.    ?Cardiovascular: Negative.   ?Gastrointestinal: Negative.   ?Genitourinary: Negative.   ?Musculoskeletal:  Positive for joint pain and myalgias.  ?Skin: Negative.   ?Neurological: Negative.   ?Endo/Heme/Allergies: Negative.   ?Psychiatric/Behavioral: Negative.    ? ?Physical Exam: ? weight is 261 lb  (118.4 kg). His oral temperature is 98 ?F (36.7 ?C). His blood pressure is 121/86 and his pulse is 67. His oxygen saturation is 97%.  ? ?Wt Readings from Last 3 Encounters:  ?09/14/21 261 lb (118.4 kg)  ?09/07/21 263 lb 8 oz (119.5 kg)  ?08/31/21 270 lb (122.5 kg)  ? ? ?Physical Exam ?Vitals reviewed.  ?HENT:  ?   Head: Normocephalic and atraumatic.  ?Eyes:  ?   Pupils: Pupils are equal, round, and reactive to light.  ?Cardiovascular:  ?   Rate and Rhythm: Normal rate and regular rhythm.  ?   Heart sounds: Normal heart sounds.  ?  Pulmonary:  ?   Effort: Pulmonary effort is normal.  ?   Breath sounds: Normal breath sounds.  ?Abdominal:  ?   General: Bowel sounds are normal.  ?   Palpations: Abdomen is soft.  ?Musculoskeletal:     ?   General: No tenderness or deformity. Normal range of motion.  ?   Cervical back: Normal range of motion.  ?Lymphadenopathy:  ?   Cervical: No cervical adenopathy.  ?Skin: ?   General: Skin is warm and dry.  ?   Findings: No erythema or rash.  ?Neurological:  ?   Mental Status: James Byrd is alert and oriented to person, place, and time.  ?Psychiatric:     ?   Behavior: Behavior normal.     ?   Thought Content: Thought content normal.     ?   Judgment: Judgment normal.  ? ? ? ?Lab Results  ?Component Value Date  ? WBC 9.6 09/14/2021  ? HGB 13.3 09/14/2021  ? HCT 39.5 09/14/2021  ? MCV 89.4 09/14/2021  ? PLT 230 09/14/2021  ? ?Lab Results  ?Component Value Date  ? FERRITIN 1,601 (H) 08/27/2021  ? IRON 39 (L) 08/27/2021  ? TIBC 228 (L) 08/27/2021  ? UIBC 189 08/27/2021  ? IRONPCTSAT 17 (L) 08/27/2021  ? ?Lab Results  ?Component Value Date  ? RETICCTPCT 1.6 11/27/2019  ? RBC 4.42 09/14/2021  ? ?No results found for: KPAFRELGTCHN, LAMBDASER, KAPLAMBRATIO ?Lab Results  ?Component Value Date  ? IGGSERUM 1,046 03/25/2020  ? IGMSERUM 64 03/25/2020  ? ?Lab Results  ?Component Value Date  ? ALBUMINELP 4.3 03/25/2020  ? A1GS 0.3 03/25/2020  ? A2GS 0.7 03/25/2020  ? BETS 0.4 03/25/2020  ? BETA2SER 0.3  03/25/2020  ? GAMS 0.9 03/25/2020  ? SPEI  03/25/2020  ?   Comment:  ?   Normal Serum Protein Electrophoresis Pattern. ?No abnormal protein bands (M-protein) detected. ?  ? ?  Chemistry   ?   ?Component Value Date

## 2021-09-14 NOTE — Addendum Note (Signed)
Addended by: Burney Gauze R on: 09/14/2021 12:57 PM ? ? Modules accepted: Orders ? ?

## 2021-09-15 ENCOUNTER — Other Ambulatory Visit: Payer: Self-pay | Admitting: *Deleted

## 2021-09-15 ENCOUNTER — Telehealth: Payer: Self-pay | Admitting: *Deleted

## 2021-09-15 ENCOUNTER — Ambulatory Visit
Admission: RE | Admit: 2021-09-15 | Discharge: 2021-09-15 | Disposition: A | Discharge status: Home / Self Care | Payer: PPO | Source: Ambulatory Visit | Attending: Radiation Oncology | Admitting: Radiation Oncology

## 2021-09-15 ENCOUNTER — Encounter: Payer: Self-pay | Admitting: *Deleted

## 2021-09-15 ENCOUNTER — Ambulatory Visit: Payer: PPO

## 2021-09-15 DIAGNOSIS — C779 Secondary and unspecified malignant neoplasm of lymph node, unspecified: Secondary | ICD-10-CM

## 2021-09-15 DIAGNOSIS — Z510 Encounter for antineoplastic radiation therapy: Secondary | ICD-10-CM | POA: Diagnosis not present

## 2021-09-15 DIAGNOSIS — C4371 Malignant melanoma of right lower limb, including hip: Secondary | ICD-10-CM

## 2021-09-15 DIAGNOSIS — C7A8 Other malignant neuroendocrine tumors: Secondary | ICD-10-CM

## 2021-09-15 NOTE — Telephone Encounter (Signed)
Patient dropped off a copy of lab results on 09/14/2021. Lab results reviewed by Hazel Sams, PA-C. Per Lovena Le, okay to increase Arava 20 mg po daily. Recheck labs in 2 weeks. ? ?Spoke with Hoyle Sauer and advised. She expressed understanding.   ?

## 2021-09-15 NOTE — Telephone Encounter (Signed)
Faxed urgent referral to Dr. Jyl Heinz @ [facility] [phone] ?

## 2021-09-15 NOTE — Progress Notes (Signed)
Referral placed to Dr. Jyl Heinz per order of Dr. Marin Olp.  ?

## 2021-09-16 ENCOUNTER — Other Ambulatory Visit: Payer: Self-pay

## 2021-09-16 ENCOUNTER — Ambulatory Visit
Admission: RE | Admit: 2021-09-16 | Discharge: 2021-09-16 | Disposition: A | Discharge status: Home / Self Care | Payer: PPO | Source: Ambulatory Visit | Attending: Radiation Oncology | Admitting: Radiation Oncology

## 2021-09-16 DIAGNOSIS — Z510 Encounter for antineoplastic radiation therapy: Secondary | ICD-10-CM | POA: Diagnosis not present

## 2021-09-17 ENCOUNTER — Ambulatory Visit
Admission: RE | Admit: 2021-09-17 | Discharge: 2021-09-17 | Disposition: A | Discharge status: Home / Self Care | Payer: PPO | Source: Ambulatory Visit | Attending: Radiation Oncology | Admitting: Radiation Oncology

## 2021-09-17 DIAGNOSIS — Z510 Encounter for antineoplastic radiation therapy: Secondary | ICD-10-CM | POA: Diagnosis not present

## 2021-09-20 ENCOUNTER — Other Ambulatory Visit: Payer: Self-pay

## 2021-09-20 ENCOUNTER — Ambulatory Visit
Admission: RE | Admit: 2021-09-20 | Discharge: 2021-09-20 | Disposition: A | Discharge status: Home / Self Care | Payer: PPO | Source: Ambulatory Visit | Attending: Radiation Oncology | Admitting: Radiation Oncology

## 2021-09-20 ENCOUNTER — Encounter: Payer: Self-pay | Admitting: Hematology & Oncology

## 2021-09-20 DIAGNOSIS — C4371 Malignant melanoma of right lower limb, including hip: Secondary | ICD-10-CM | POA: Diagnosis present

## 2021-09-20 DIAGNOSIS — C774 Secondary and unspecified malignant neoplasm of inguinal and lower limb lymph nodes: Secondary | ICD-10-CM | POA: Diagnosis present

## 2021-09-20 DIAGNOSIS — Z510 Encounter for antineoplastic radiation therapy: Secondary | ICD-10-CM | POA: Diagnosis present

## 2021-09-21 ENCOUNTER — Ambulatory Visit
Admission: RE | Admit: 2021-09-21 | Discharge: 2021-09-21 | Disposition: A | Discharge status: Home / Self Care | Payer: PPO | Source: Ambulatory Visit | Attending: Radiation Oncology | Admitting: Radiation Oncology

## 2021-09-21 DIAGNOSIS — Z510 Encounter for antineoplastic radiation therapy: Secondary | ICD-10-CM | POA: Diagnosis not present

## 2021-09-22 ENCOUNTER — Other Ambulatory Visit: Payer: Self-pay

## 2021-09-22 ENCOUNTER — Ambulatory Visit
Admission: RE | Admit: 2021-09-22 | Discharge: 2021-09-22 | Disposition: A | Discharge status: Home / Self Care | Payer: PPO | Source: Ambulatory Visit | Attending: Radiation Oncology | Admitting: Radiation Oncology

## 2021-09-22 ENCOUNTER — Ambulatory Visit: Payer: PPO

## 2021-09-22 DIAGNOSIS — Z510 Encounter for antineoplastic radiation therapy: Secondary | ICD-10-CM | POA: Diagnosis not present

## 2021-09-22 MED ORDER — LEFLUNOMIDE 10 MG PO TABS: 20.0000 mg | ORAL_TABLET | Freq: Every day | ORAL | 2 refills | Status: DC

## 2021-09-22 NOTE — Telephone Encounter (Signed)
Patient called requesting prescription refill of Leflunomide to be sent to [facility].   ?

## 2021-09-22 NOTE — Telephone Encounter (Signed)
Next Visit: 10/12/2021 ? ?Last Visit: 08/31/2021 ? ?Last Fill: 09/01/2021 ? ?DX: Chronic inflammatory arthritis  ? ?Current Dose per phone note 09/15/2021: , okay to increase Arava 20 mg po daily ? ?Labs: 09/14/2021 BUN 27, Neutro Abs 7.9, Abs Immature Granulocytes 0.10 ? ?Okay to refill Arava?  ?

## 2021-09-23 ENCOUNTER — Ambulatory Visit
Admission: RE | Admit: 2021-09-23 | Discharge: 2021-09-23 | Disposition: A | Discharge status: Home / Self Care | Payer: PPO | Source: Ambulatory Visit | Attending: Radiation Oncology | Admitting: Radiation Oncology

## 2021-09-23 DIAGNOSIS — Z510 Encounter for antineoplastic radiation therapy: Secondary | ICD-10-CM | POA: Diagnosis not present

## 2021-09-24 ENCOUNTER — Other Ambulatory Visit: Payer: Self-pay

## 2021-09-24 ENCOUNTER — Ambulatory Visit
Admission: RE | Admit: 2021-09-24 | Discharge: 2021-09-24 | Disposition: A | Discharge status: Home / Self Care | Payer: PPO | Source: Ambulatory Visit | Attending: Radiation Oncology | Admitting: Radiation Oncology

## 2021-09-24 DIAGNOSIS — Z510 Encounter for antineoplastic radiation therapy: Secondary | ICD-10-CM | POA: Diagnosis not present

## 2021-09-27 ENCOUNTER — Ambulatory Visit: Payer: PPO

## 2021-09-27 ENCOUNTER — Ambulatory Visit
Admission: RE | Admit: 2021-09-27 | Discharge: 2021-09-27 | Disposition: A | Discharge status: Home / Self Care | Payer: PPO | Source: Ambulatory Visit | Attending: Radiation Oncology | Admitting: Radiation Oncology

## 2021-09-27 DIAGNOSIS — Z510 Encounter for antineoplastic radiation therapy: Secondary | ICD-10-CM | POA: Diagnosis not present

## 2021-09-28 ENCOUNTER — Other Ambulatory Visit: Payer: Self-pay

## 2021-09-28 ENCOUNTER — Encounter: Payer: Self-pay | Admitting: Hematology & Oncology

## 2021-09-28 ENCOUNTER — Encounter: Payer: Self-pay | Admitting: Radiation Oncology

## 2021-09-28 ENCOUNTER — Ambulatory Visit
Admission: RE | Admit: 2021-09-28 | Discharge: 2021-09-28 | Disposition: A | Discharge status: Home / Self Care | Payer: PPO | Source: Ambulatory Visit | Attending: Radiation Oncology | Admitting: Radiation Oncology

## 2021-09-28 DIAGNOSIS — Z510 Encounter for antineoplastic radiation therapy: Secondary | ICD-10-CM | POA: Diagnosis not present

## 2021-09-28 NOTE — Progress Notes (Deleted)
Office Visit Note  Patient: James Byrd             Date of Birth: [birth year]           MRN: [id number]             PCP: Emmaline Kluver, MD Referring: Emmaline Kluver, * Visit Date: 10/12/2021 Occupation: '@GUAROCC'$ @  Subjective:  Medication monitoring   History of Present Illness: James Byrd is a 56 y.o. male with history of chronic inflammatory arthritis and osteoarthritis.  Patient is taking arava 20 mg 1 tablet by mouth daily. Patent was started on arava after his last office visit on 08/31/21.    CBC and CMP updated on 10/04/21: WBC count is low-3.8, absolute lymphocytes are low-0.2, hgb 12.9, plt 123, CMP WNL.     Activities of Daily Living:  Patient reports morning stiffness for *** {minute/hour:19697}.   Patient {ACTIONS;DENIES/REPORTS:21021675::"Denies"} nocturnal pain.  Difficulty dressing/grooming: {ACTIONS;DENIES/REPORTS:21021675::"Denies"} Difficulty climbing stairs: {ACTIONS;DENIES/REPORTS:21021675::"Denies"} Difficulty getting out of chair: {ACTIONS;DENIES/REPORTS:21021675::"Denies"} Difficulty using hands for taps, buttons, cutlery, and/or writing: {ACTIONS;DENIES/REPORTS:21021675::"Denies"}  No Rheumatology ROS completed.   PMFS History:  Patient Active Problem List   Diagnosis Date Noted   Metastatic melanoma to lymph node ([place]) 12/16/2020   Pain in both feet 10/17/2019   Bilateral pulmonary embolism ([place]) 08/26/2019   Neuroendocrine carcinoma of pancreas ([place]) 05/22/2019   Severe sepsis with septic shock ([place]) 01/15/2019   Acute respiratory failure with hypoxia ([place]) 01/15/2019   AKI (acute kidney injury) ([place]) 01/15/2019   Septic shock ([place]) 01/13/2019   Goals of care, counseling/discussion 12/17/2018   Adrenal insufficiency (HCC)    Refractory nausea and vomiting    Paranasal sinus disease    Acute renal failure syndrome ([place])    HCAP (healthcare-associated pneumonia) 03/06/2015   Drug-induced pneumonitis - VERVOY    Thrombocytopenia  ([place]) 02/09/2015   HLD (hyperlipidemia)    Chronic diastolic congestive heart failure ([place])    Malignant melanoma of right great toe Stage IIIB ([id number]) 11/14/2014   Obstructive sleep apnea 07/19/2012   Paroxysmal supraventricular tachycardia ([place]) 05/24/2012   Chest pressure 05/24/2012   Palpitations 05/24/2012   Anxiety 05/24/2012    Past Medical History:  Diagnosis Date   Adrenal insufficiency ([place])    Anxiety 62/07/6331   Complication of anesthesia    SLO TO AWAKEN MANY 8 TO 9 YRS AGO, NO ISSUES SINCE   Goals of care, counseling/discussion 12/17/2018   Gout    History of kidney stones    HLD (hyperlipidemia)    Malignant melanoma of right great toe ([place]) 11/14/2014   Neuroendocrine carcinoma of pancreas ([place]) 05/22/2019   Paroxysmal supraventricular tachycardia ([place]) 05/24/2012   Described in the treadmill report; details are pending    PE (pulmonary thromboembolism) ([place]) 08/2019   Rheumatoid arthritis ([place])    Sleep apnea    MILD NO CPAP NEEDED    Family History  Adopted: Yes  Problem Relation Age of Onset   Stroke Mother    Breast cancer Mother    COPD Father    Diabetes Father    Dementia Father    Diabetes Sister    Past Surgical History:  Procedure Laterality Date   CHOLECYSTECTOMY     ESOPHAGOGASTRODUODENOSCOPY (EGD) WITH PROPOFOL N/A 05/13/2019   Procedure: ESOPHAGOGASTRODUODENOSCOPY (EGD) WITH PROPOFOL;  Surgeon: Arta Silence, MD;  Location: [facility];  Service: Endoscopy;  Laterality: N/A;   EXTRACORPOREAL SHOCK WAVE LITHOTRIPSY  2016   FINE NEEDLE ASPIRATION N/A 05/13/2019   Procedure: FINE NEEDLE  ASPIRATION (FNA) LINEAR;  Surgeon: Arta Silence, MD;  Location: [facility];  Service: Endoscopy;  Laterality: N/A;   LYMPH NODE BIOPSY Right 11/06/2018   Procedure: RIGHT INGUINAL LYMPH NODE BIOPSY AND POSSIBLE RIGHT GROIN NODE DISECTION;  Surgeon: Alphonsa Overall, MD;  Location: [facility];  Service: General;  Laterality: Right;   r toe partial ambutation      ROTATOR CUFF REPAIR Right 2010   3 yrs ago   UPPER ESOPHAGEAL ENDOSCOPIC ULTRASOUND (EUS) N/A 05/13/2019   Procedure: UPPER ESOPHAGEAL ENDOSCOPIC ULTRASOUND (EUS);  Surgeon: Arta Silence, MD;  Location: [facility];  Service: Endoscopy;  Laterality: N/A;   Social History   Social History Narrative   Lives with wife in a one story home.  Has 2 children.  On disability.  Education: high school.+   Immunization History  Administered Date(s) Administered   Influenza,inj,Quad PF,6+ Mos 03/19/2019   Influenza-Unspecified 04/03/2014     Objective: Vital Signs: There were no vitals taken for this visit.   Physical Exam Vitals and nursing note reviewed.  Constitutional:      Appearance: He is well-developed.  HENT:     Head: Normocephalic and atraumatic.  Eyes:     Conjunctiva/sclera: Conjunctivae normal.     Pupils: Pupils are equal, round, and reactive to light.  Cardiovascular:     Rate and Rhythm: Normal rate and regular rhythm.     Heart sounds: Normal heart sounds.  Pulmonary:     Effort: Pulmonary effort is normal.     Breath sounds: Normal breath sounds.  Abdominal:     General: Bowel sounds are normal.     Palpations: Abdomen is soft.  Musculoskeletal:     Cervical back: Normal range of motion and neck supple.  Skin:    General: Skin is warm and dry.     Capillary Refill: Capillary refill takes less than 2 seconds.  Neurological:     Mental Status: He is alert and oriented to person, place, and time.  Psychiatric:        Behavior: Behavior normal.     Musculoskeletal Exam: ***  CDAI Exam: CDAI Score: -- Patient Global: --; Provider Global: -- Swollen: --; Tender: -- Joint Exam 10/12/2021   No joint exam has been documented for this visit   There is currently no information documented on the homunculus. Go to the Rheumatology activity and complete the homunculus joint exam.  Investigation: No additional findings.  Imaging: No results  found.  Recent Labs: Lab Results  Component Value Date   WBC 9.6 09/14/2021   HGB 13.3 09/14/2021   PLT 230 09/14/2021   NA 138 09/14/2021   K 3.8 09/14/2021   CL 104 09/14/2021   CO2 25 09/14/2021   GLUCOSE 86 09/14/2021   BUN 27 (H) 09/14/2021   CREATININE 0.90 09/14/2021   BILITOT 0.3 09/14/2021   ALKPHOS 53 09/14/2021   AST 16 09/14/2021   ALT 15 09/14/2021   PROT 7.7 09/14/2021   ALBUMIN 4.6 09/14/2021   CALCIUM 10.2 09/14/2021   GFRAA 105 06/04/2020   QFTBGOLDPLUS NEGATIVE 03/25/2020    Speciality Comments: No specialty comments available.  Procedures:  No procedures performed Allergies: Patient has no known allergies.   Assessment / Plan:     Visit Diagnoses: No diagnosis found.  Orders: No orders of the defined types were placed in this encounter.  No orders of the defined types were placed in this encounter.   Face-to-face time spent with patient was *** minutes. Greater than  50% of time was spent in counseling and coordination of care.  Follow-Up Instructions: No follow-ups on file.   Earnestine Mealing, CMA  Note - This record has been created using Editor, commissioning.  Chart creation errors have been sought, but may not always  have been located. Such creation errors do not reflect on  the standard of medical care.

## 2021-10-04 ENCOUNTER — Inpatient Hospital Stay: Payer: PPO | Admitting: Hematology & Oncology

## 2021-10-04 ENCOUNTER — Inpatient Hospital Stay: Payer: PPO | Attending: Hematology & Oncology

## 2021-10-04 ENCOUNTER — Encounter: Payer: Self-pay | Admitting: Hematology & Oncology

## 2021-10-04 ENCOUNTER — Other Ambulatory Visit: Payer: Self-pay

## 2021-10-04 VITALS — BP 111/73 | HR 68 | Temp 97.6°F | Resp 19 | Wt 262.0 lb

## 2021-10-04 DIAGNOSIS — C774 Secondary and unspecified malignant neoplasm of inguinal and lower limb lymph nodes: Secondary | ICD-10-CM | POA: Insufficient documentation

## 2021-10-04 DIAGNOSIS — C4371 Malignant melanoma of right lower limb, including hip: Secondary | ICD-10-CM | POA: Insufficient documentation

## 2021-10-04 DIAGNOSIS — D509 Iron deficiency anemia, unspecified: Secondary | ICD-10-CM | POA: Insufficient documentation

## 2021-10-04 DIAGNOSIS — M069 Rheumatoid arthritis, unspecified: Secondary | ICD-10-CM | POA: Insufficient documentation

## 2021-10-04 DIAGNOSIS — Z923 Personal history of irradiation: Secondary | ICD-10-CM | POA: Diagnosis not present

## 2021-10-04 DIAGNOSIS — Z79899 Other long term (current) drug therapy: Secondary | ICD-10-CM | POA: Diagnosis not present

## 2021-10-04 DIAGNOSIS — C779 Secondary and unspecified malignant neoplasm of lymph node, unspecified: Secondary | ICD-10-CM | POA: Diagnosis not present

## 2021-10-04 LAB — CMP (CANCER CENTER ONLY)
ALT: 15 U/L (ref 0–44)
AST: 15 U/L (ref 15–41)
Albumin: 4.2 g/dL (ref 3.5–5.0)
Alkaline Phosphatase: 75 U/L (ref 38–126)
Anion gap: 7 (ref 5–15)
BUN: 17 mg/dL (ref 6–20)
CO2: 32 mmol/L (ref 22–32)
Calcium: 9.7 mg/dL (ref 8.9–10.3)
Chloride: 101 mmol/L (ref 98–111)
Creatinine: 0.95 mg/dL (ref 0.61–1.24)
GFR, Estimated: >60 mL/min (ref >60)
Glucose, Bld: 96 mg/dL (ref 70–99)
Potassium: 4.1 mmol/L (ref 3.5–5.1)
Sodium: 140 mmol/L (ref 135–145)
Total Bilirubin: 0.5 mg/dL (ref 0.3–1.2)
Total Protein: 7.2 g/dL (ref 6.5–8.1)

## 2021-10-04 LAB — CBC WITH DIFFERENTIAL (CANCER CENTER ONLY)
Abs Immature Granulocytes: 0.02 10*3/uL (ref 0.00–0.07)
Basophils Absolute: 0 10*3/uL (ref 0.0–0.1)
Basophils Relative: 0 %
Eosinophils Absolute: 0.1 10*3/uL (ref 0.0–0.5)
Eosinophils Relative: 3 %
HCT: 39.4 % (ref 39.0–52.0)
Hemoglobin: 12.9 g/dL — ABNORMAL LOW (ref 13.0–17.0)
Immature Granulocytes: 1 %
Lymphocytes Relative: 5 %
Lymphs Abs: 0.2 10*3/uL — ABNORMAL LOW (ref 0.7–4.0)
MCH: 30.2 pg (ref 26.0–34.0)
MCHC: 32.7 g/dL (ref 30.0–36.0)
MCV: 92.3 fL (ref 80.0–100.0)
Monocytes Absolute: 0.3 10*3/uL (ref 0.1–1.0)
Monocytes Relative: 8 %
Neutro Abs: 3.2 10*3/uL (ref 1.7–7.7)
Neutrophils Relative %: 83 %
Platelet Count: 123 10*3/uL — ABNORMAL LOW (ref 150–400)
RBC: 4.27 MIL/uL (ref 4.22–5.81)
RDW: 15.1 % (ref 11.5–15.5)
WBC Count: 3.8 10*3/uL — ABNORMAL LOW (ref 4.0–10.5)
nRBC: 0 % (ref 0.0–0.2)

## 2021-10-04 LAB — LACTATE DEHYDROGENASE: LDH: 169 U/L (ref 98–192)

## 2021-10-04 MED ORDER — SOLIFENACIN SUCCINATE 10 MG PO TABS: 10.0000 mg | ORAL_TABLET | Freq: Every day | ORAL | 3 refills | Status: DC

## 2021-10-04 NOTE — Progress Notes (Signed)
?Hematology and Oncology Follow Up Visit ? ?James Byrd ?[id number] ?05-Jan-1966 56 y.o. ?10/04/2021 ? ? ?Principle Diagnosis:  ?Stage IIIB ([id number]) subungual melanoma of the right hallux -- nodal recurrence in the RIGHT inguinal nodes -- BRAF wt ?RIGHT Common Iliac node recurrence ?Progression of melanoma-lymph node metastasis- 02/2021 ?Neuroendocrine carcinoma of the pancreas ?Iron def anemia ?  ?Past Therapy:        ?Yervoy-status post 3 cycles - discontinued in September 2016 secondary to toxicity ?Somatuline 120 mg IM q month -- started on 05/22/2019 - d/c on 08/2019 ?XRT to the RIGHT inguinal basin ?XRT to abdominal lymph nodes-completed 09/21/2021 ? ?Current Therapy:   ?Nivolumab 480 mg q month -- started on 03/19/2021, s/p cycle #4 ?IV Iron as indicated  ?  ?Interim History:  Mr. James Byrd is here today for follow-up.  He completed radiation therapy.  He had radiation therapy to a couple spots. ? ?He has appoint with Dr. Crisoforo [facility] at [facility] is to see about the possibility of resecting out any disease. ? ?His problem right now is that he is having urinary frequency.  Apparently, he was told that a tumor is present on his bladder.  I will try him on some Vesicare (10 mg p.o. daily) to see if this may help. ? ?He has seen urology.  They did do an ultrasound of his scrotum.  There really is nothing in there outside of maybe a cyst that they find.  However they did not think it was the cause for any kind of pain. ? ?His pain seems to be doing pretty well right now.  He is on a Duragesic patch which does seem to be helping him. ? ?He has had no cough or shortness of breath.  He has had no problems with leg swelling. ? ?I think that if he is not a candidate for surgery, we will go ahead and see about chemotherapy with dacarbazine.  We have tried everything else for his melanoma. ? ?He has had no problems with fever.  There is no bleeding.  He has had no headache. ? ?His arthritis does cause him problems.  He has bad  rheumatoid arthritis.  His joints swell quite a bit. ? ?Overall, I would say his performance status is probably ECOG 1.   ? ?Medications:  ?Allergies as of 10/04/2021   ?No Known Allergies ?  ? ?  ?Medication List  ?  ? ?  ? Accurate as of October 04, 2021  9:18 AM. If you have any questions, ask your nurse or doctor.  ?  ?  ? ?  ? ?STOP taking these medications   ? ?Methotrexate (PF) 25 MG/0.4ML Soaj ?Stopped by: [name], MD ?  ? ?  ? ?TAKE these medications   ? ?acetaminophen 650 MG CR tablet ?Commonly known as: TYLENOL ?Take 1,300 mg by mouth daily as needed for pain. ?  ?augmented betamethasone dipropionate 0.05 % cream ?Commonly known as: DIPROLENE-AF ?Apply 1 application topically 2 (two) times daily as needed (psoriasis). ?  ?BLACK CURRANT SEED OIL PO ?Take 2 tablets by mouth daily. ?  ?cyanocobalamin 2000 MCG tablet ?Take 2,000 mcg by mouth daily. Vitamin b12 ?  ?diclofenac sodium 1 % Gel ?Commonly known as: VOLTAREN ?Apply 2 g topically daily as needed (pain). ?  ?DULoxetine 20 MG capsule ?Commonly known as: CYMBALTA ?Take 20 mg by mouth daily. ?  ?fentaNYL 25 MCG/HR ?Commonly known as: [place] ?Place 1 patch onto the skin every 3 (three) days. ?  ?  FISH OIL PO ?Take by mouth daily. ?  ?folic acid 1 MG tablet ?Commonly known as: FOLVITE ?TAKE TWO TABLETS BY MOUTH ONCE DAILY ?  ?hydrocortisone 5 MG tablet ?Commonly known as: CORTEF ?Take 5 mg by mouth 3 (three) times daily. ?  ?leflunomide 10 MG tablet ?Commonly known as: ARAVA ?Take 2 tablets (20 mg total) by mouth daily. ?  ?levofloxacin 750 MG tablet ?Commonly known as: LEVAQUIN ?Take 750 mg by mouth daily. ?  ?Melatonin 10 MG Tabs ?Take 10 mg by mouth at bedtime as needed. ?  ?multivitamin with minerals Tabs tablet ?Take 1 tablet by mouth daily. ?  ?naloxone 4 MG/0.1ML Liqd nasal spray kit ?Commonly known as: NARCAN ?  ?ondansetron 8 MG disintegrating tablet ?Commonly known as: ZOFRAN-ODT ?Take 1 tablet (8 mg total) by mouth every 8 (eight) hours  as needed for nausea or vomiting. ?  ?OVER THE COUNTER MEDICATION ?Take 0.5 each by mouth as needed. CBD gummies ?  ?oxyCODONE-acetaminophen 10-325 MG tablet ?Commonly known as: Percocet ?Take 1 tablet by mouth every 4 (four) hours as needed for pain. ?  ?predniSONE 10 MG tablet ?Commonly known as: DELTASONE ?Take 1 tablet (10 mg total) by mouth daily with breakfast. ?  ?pregabalin 75 MG capsule ?Commonly known as: LYRICA ?Take 75 mg by mouth 2 (two) times daily. ?  ?pyridOXINE 100 MG tablet ?Commonly known as: VITAMIN B-6 ?Take 100 mg by mouth daily. ?  ?sildenafil 50 MG tablet ?Commonly known as: Viagra ?Take 1 tablet (50 mg total) by mouth daily as needed for erectile dysfunction. ?  ?Testosterone 20.25 MG/ACT (1.62%) Gel ?  ?vitamin C 1000 MG tablet ?Take 1,000 mg by mouth daily. ?  ?Vitamin D 50 MCG (2000 UT) tablet ?Take 2,000 Units by mouth daily. ?  ?zinc gluconate 50 MG tablet ?Take 50 mg by mouth daily. ?  ? ?  ? ? ?Allergies: No Known Allergies ? ?Past Medical History, Surgical history, Social history, and Family History were reviewed and updated. ? ?Review of Systems: ?Review of Systems  ?Constitutional: Negative.   ?HENT: Negative.    ?Eyes: Negative.   ?Respiratory: Negative.    ?Cardiovascular: Negative.   ?Gastrointestinal: Negative.   ?Genitourinary: Negative.   ?Musculoskeletal:  Positive for joint pain and myalgias.  ?Skin: Negative.   ?Neurological: Negative.   ?Endo/Heme/Allergies: Negative.   ?Psychiatric/Behavioral: Negative.    ? ?Physical Exam: ? weight is 262 lb (118.8 kg). His oral temperature is 97.6 ?F (36.4 ?C). His blood pressure is 111/73 and his pulse is 68. His respiration is 19 and oxygen saturation is 97%.  ? ?Wt Readings from Last 3 Encounters:  ?10/04/21 262 lb (118.8 kg)  ?09/14/21 261 lb (118.4 kg)  ?09/07/21 263 lb 8 oz (119.5 kg)  ? ? ?Physical Exam ?Vitals reviewed.  ?HENT:  ?   Head: Normocephalic and atraumatic.  ?Eyes:  ?   Pupils: Pupils are equal, round, and reactive  to light.  ?Cardiovascular:  ?   Rate and Rhythm: Normal rate and regular rhythm.  ?   Heart sounds: Normal heart sounds.  ?Pulmonary:  ?   Effort: Pulmonary effort is normal.  ?   Breath sounds: Normal breath sounds.  ?Abdominal:  ?   General: Bowel sounds are normal.  ?   Palpations: Abdomen is soft.  ?Musculoskeletal:     ?   General: No tenderness or deformity. Normal range of motion.  ?   Cervical back: Normal range of motion.  ?Lymphadenopathy:  ?  Cervical: No cervical adenopathy.  ?Skin: ?   General: Skin is warm and dry.  ?   Findings: No erythema or rash.  ?Neurological:  ?   Mental Status: He is alert and oriented to person, place, and time.  ?Psychiatric:     ?   Behavior: Behavior normal.     ?   Thought Content: Thought content normal.     ?   Judgment: Judgment normal.  ? ? ? ?Lab Results  ?Component Value Date  ? WBC 3.8 (L) 10/04/2021  ? HGB 12.9 (L) 10/04/2021  ? HCT 39.4 10/04/2021  ? MCV 92.3 10/04/2021  ? PLT 123 (L) 10/04/2021  ? ?Lab Results  ?Component Value Date  ? FERRITIN 1,601 (H) 08/27/2021  ? IRON 39 (L) 08/27/2021  ? TIBC 228 (L) 08/27/2021  ? UIBC 189 08/27/2021  ? IRONPCTSAT 17 (L) 08/27/2021  ? ?Lab Results  ?Component Value Date  ? RETICCTPCT 1.6 11/27/2019  ? RBC 4.27 10/04/2021  ? ?No results found for: KPAFRELGTCHN, LAMBDASER, KAPLAMBRATIO ?Lab Results  ?Component Value Date  ? IGGSERUM 1,046 03/25/2020  ? IGMSERUM 64 03/25/2020  ? ?Lab Results  ?Component Value Date  ? ALBUMINELP 4.3 03/25/2020  ? A1GS 0.3 03/25/2020  ? A2GS 0.7 03/25/2020  ? BETS 0.4 03/25/2020  ? BETA2SER 0.3 03/25/2020  ? GAMS 0.9 03/25/2020  ? SPEI  03/25/2020  ?   Comment:  ?   Normal Serum Protein Electrophoresis Pattern. ?No abnormal protein bands (M-protein) detected. ?  ? ?  Chemistry   ?   ?Component Value Date/Time  ? NA 140 10/04/2021 0818  ? NA 138 06/04/2020 1003  ? NA 145 06/06/2017 1316  ? NA 141 10/16/2015 1334  ? K 4.1 10/04/2021 0818  ? K 4.1 06/06/2017 1316  ? K 3.9 10/16/2015 1334  ? CL  101 10/04/2021 0818  ? CL 108 06/06/2017 1316  ? CO2 32 10/04/2021 0818  ? CO2 26 06/06/2017 1316  ? CO2 21 (L) 10/16/2015 1334  ? BUN 17 10/04/2021 0818  ? BUN 17 06/04/2020 1003  ? BUN 19 06/06/2017 1316  ? B

## 2021-10-07 ENCOUNTER — Telehealth: Payer: Self-pay | Admitting: *Deleted

## 2021-10-07 NOTE — Telephone Encounter (Signed)
Please have patient repeat CBC in 2 weeks.  If his white cell count and platelets are still low then we may have to reduce the dose of leflunomide to 10 mg p.o. daily.

## 2021-10-07 NOTE — Telephone Encounter (Signed)
Hoyle Sauer advised to have patient repeat CBC in 2 weeks.  If his white cell count and platelets are still low then we may have to reduce the dose of leflunomide to 10 mg p.o. daily. She expressed understanding and states James Byrd just finished his radiation.  ?

## 2021-10-07 NOTE — Telephone Encounter (Signed)
Patient's wife called stating [facility] had labs done with Dr. Marin Olp on 10/04/2021. Patient recently increased Arava to 20 mg recently and would like to know if he may continue on the 20 mg based on the labs. Please review and advise. Thanks! ?

## 2021-10-12 ENCOUNTER — Ambulatory Visit: Payer: PPO | Admitting: Physician Assistant

## 2021-10-12 DIAGNOSIS — E2740 Unspecified adrenocortical insufficiency: Secondary | ICD-10-CM

## 2021-10-12 DIAGNOSIS — M19041 Primary osteoarthritis, right hand: Secondary | ICD-10-CM

## 2021-10-12 DIAGNOSIS — M1990 Unspecified osteoarthritis, unspecified site: Secondary | ICD-10-CM

## 2021-10-12 DIAGNOSIS — C4371 Malignant melanoma of right lower limb, including hip: Secondary | ICD-10-CM

## 2021-10-12 DIAGNOSIS — C7A8 Other malignant neuroendocrine tumors: Secondary | ICD-10-CM

## 2021-10-12 DIAGNOSIS — M170 Bilateral primary osteoarthritis of knee: Secondary | ICD-10-CM

## 2021-10-12 DIAGNOSIS — Z8639 Personal history of other endocrine, nutritional and metabolic disease: Secondary | ICD-10-CM

## 2021-10-12 DIAGNOSIS — T50905A Adverse effect of unspecified drugs, medicaments and biological substances, initial encounter: Secondary | ICD-10-CM

## 2021-10-12 DIAGNOSIS — Z79899 Other long term (current) drug therapy: Secondary | ICD-10-CM

## 2021-10-12 DIAGNOSIS — I2699 Other pulmonary embolism without acute cor pulmonale: Secondary | ICD-10-CM

## 2021-10-12 DIAGNOSIS — I471 Supraventricular tachycardia: Secondary | ICD-10-CM

## 2021-10-12 DIAGNOSIS — A419 Sepsis, unspecified organism: Secondary | ICD-10-CM

## 2021-10-12 DIAGNOSIS — G4733 Obstructive sleep apnea (adult) (pediatric): Secondary | ICD-10-CM

## 2021-10-12 DIAGNOSIS — M19071 Primary osteoarthritis, right ankle and foot: Secondary | ICD-10-CM

## 2021-10-14 ENCOUNTER — Telehealth: Payer: Self-pay | Admitting: Radiology

## 2021-10-14 ENCOUNTER — Telehealth: Payer: Self-pay | Admitting: *Deleted

## 2021-10-14 NOTE — Telephone Encounter (Signed)
Patient's  wife called, She has concerns about him and would like a sooner appt.  She also stated his surgeon from [facility] would like to speak to Dr. Sondra Come as soon as possible about possible surgery and CT results.  Her name is Hoyle Sauer and [phone]. ?Reports urinary frequency, urgency and incontinence. He is also having periodic diarrhea. ?

## 2021-10-14 NOTE — Telephone Encounter (Signed)
RETURNED PATIENT'S WIFE'S PHONE CALL, SPOKE WITH PATIENT'S WIFE ?

## 2021-10-18 ENCOUNTER — Telehealth: Payer: Self-pay | Admitting: *Deleted

## 2021-10-18 NOTE — Telephone Encounter (Signed)
CALLED PATIENT'S WIFE - CAROLYN TO INFORM THAT DR. KINARD WOULD BE GLAD TO SEE HER HUSBAND ON 10-19-21 @ 9 AM, PATIENT'S WIFE CAROLYN AGREED TO THIS APPT. ?

## 2021-10-18 NOTE — Progress Notes (Incomplete)
?  Radiation Oncology         [phone] ?________________________________ ? ?Patient Name: James Byrd ?MRN: [id number] ?DOB: 1966-03-08 ?Referring Physician: Burney Gauze (Profile Not Attached) ?Date of Service: 09/28/2021 ?[facility], [place] ? ?                                                      End Of Treatment Note ? 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 ?Cancer Staging: The primary encounter diagnosis was Malignant melanoma of right great toe Stage IIIB ([id number]). Diagnoses of Neuroendocrine carcinoma of pancreas ([facility]) and Metastatic melanoma to lymph node [facility]) were also pertinent to this visit. ?  ?Stage IIIB ([id number]) subungual melanoma of the right hallux; nodal recurrence in the right inguinal nodes; BRAF with right common iliac node recurrence; progression of melanoma-lymph node metastasis 02/2021 ?  ?Neuroendocrine carcinoma of the pancreas ? ? ?Intent: Palliative ? ?Radiation Treatment Dates: 09/15/2021 through 09/28/2021 ?Site Technique Total Dose (Gy) Dose per Fx (Gy) Completed Fx Beam Energies  ?Internal Iliac Nodes: Pelvis IMRT 30/30 3 10/10 6X  ?Abdomen: Abd IMRT 30/30 3 10/10 6X  ? ?Narrative: The patient tolerated radiation therapy with some difficulty. During his final weekly treatment check on 09/22/21, the patient reported abdominal pain, fatigue, nausea, vomiting, urinary frequency, urgency, and poor appetite.  ? ?Plan: The patient will follow-up with radiation oncology in one month . ? ?________________________________________________ ?----------------------------------- ? ?Blair Promise, PhD, MD ? ?This document serves as a record of services personally performed by Gery Pray, MD. It was created on his behalf by Roney Mans, a trained medical scribe. The creation of this record is based on the scribe's personal observations and the provider's statements to  them. This document has been checked and approved by the attending provider. ? ?

## 2021-10-18 NOTE — Progress Notes (Signed)
?Radiation Oncology         [phone] ?________________________________ ? ?Name: James Byrd MRN: [id number]  ?Date: 10/19/2021  DOB: 09/25/1965 ? ?Follow-Up Visit Note ? ?CC: Street, Sharon Mt, MD  [name], MD ? ?  ICD-10-CM   ?1. Malignant melanoma of right great toe Stage IIIB ([id number])  C43.71   ?  ?2. Neuroendocrine carcinoma of pancreas ([place])  C7A.8   ?  ?3. Metastatic melanoma to lymph node (HCC)  C77.9   ?  ? ? ?Diagnosis:  The primary encounter diagnosis was Malignant melanoma of right great toe Stage IIIB ([id number]). Diagnoses of Neuroendocrine carcinoma of pancreas ([place]) and Metastatic melanoma to lymph node [facility]) were also pertinent to this visit. ?  ?Stage IIIB ([id number]) subungual melanoma of the right hallux; nodal recurrence in the right inguinal nodes; BRAF with right common iliac node recurrence; progression of melanoma-lymph node metastasis 02/2021 ?  ?Neuroendocrine carcinoma of the pancreas ? ?Interval Since Last Radiation: 21 days  ? ?3) Intent: Palliative ?    Radiation Treatment Dates: 09/15/2021 through 09/28/2021 ?Site Technique Total Dose (Gy) Dose per Fx (Gy) Completed Fx Beam Energies  ?Internal Iliac Nodes: Pelvis IMRT 30/30 3 10/10 6X  ?Abdomen: Abd IMRT 30/30 3 10/10 6X  ? ? ?2) 12/29/20 - 01/08/21: The right common iliac lymph node and a left gluteal nodule were treated to 50 Gy in 5 fractions of 10 Gy (Dr. Tammi Klippel) ?  ?  ?1) Right inguinal lymph nodes : 12/27/2018 through 01/29/2019  ?Site Technique Total Dose Dose per Fx Completed Fx Beam Energies  ?Extremity: Ext_Rt_rt ingu 3D 48/48 2.4 20/20 6X, 10X  ? ? ? ?Narrative:  The patient returns today for 1 month follow-up. The patient tolerated radiation therapy with some difficulty. During his final weekly treatment check on 09/22/21, the patient reported abdominal pain, fatigue, nausea, vomiting, urinary frequency, urgency, and poor appetite.        ? ?Since he was seen for re-evaluation on 09/07/21, the patient followed  up with [facility] surgical oncology on 09/09/21. During which time, the patient reported his primary concern being urinary frequency and nocturia caused by the tumor pushing against his bladder, causing him to urinary every 1.5 hours. He also endorsed intermittent and sudden cramps around his groin, and back pain + left LE pain due to one of his tumors causing presumptive nerve compression. During this visit, Dr. Freddi Che discussed the role of palliative surgery to improve the patient's symptoms. The patient was informed that his symptoms of back and left LE pain are on the opposite side of his tumor, and that removing the tumor may not relieve his urinary symptoms but make them worse. That being said, surgical intervention was not recommended.       ? ?The patient also followed up with Dr. Marin Olp on 09/14/21. During which time, the role of chemotherapy was discussed with the patient given that targeted and immunotherapy treatment options have been exhausted. Pending his response to RT and further consideration for surgery by [facility], the patient may return to Dr. Marin Olp to discuss the role of chemotherapy further.  ? ?Accordingly, the patient met with Dr. Crisoforo Oxford, [facility]ology, on 10/05/21 to further discuss the role of surgery. CT of the abdomen and pelvis performed during this visit revealed metastatic disease involving a necrotic right external iliac and left para-aortic lymph node, with the nodes exerting mass effect on adjacent structures including the urinary bladder (which appeared displaced to the left).  CT also showed an indeterminate ovoid 19 mm structure in the 5 o'clock perianal region, with mild peripheral enhancement. No substantial surrounding inflammatory changes were appreciated. After review of imaging, differentials for urinary symptoms noted by Dr. Shen included possible effects from RT vs compression of the bladder by the metastatic lymph node.  ? ?Patient returns today for  discussion of his symptoms and to help determine whether light of his urinary symptoms are related to his recent course of radiation therapy or whether they are related to the mass effect of the tumor along his right bladder. ? ?Patient was placed on Vesicare by Dr. Ennever for his urinary symptoms.  According to the patient this has not really helped.  The patient denies any hematuria or dysuria.  Upon standing has immediate urge symptoms and oftentimes has incontinence because of this issue.  He also has nocturia 5-6 times at night making it difficult for him to get good rest. ? ?Allergies:  has No Known Allergies. ? ?Meds: ?Current Outpatient Medications  ?Medication Sig Dispense Refill  ? acetaminophen (TYLENOL) 650 MG CR tablet Take 1,300 mg by mouth daily as needed for pain.    ? Ascorbic Acid (VITAMIN C) 1000 MG tablet Take 1,000 mg by mouth daily.    ? augmented betamethasone dipropionate (DIPROLENE-AF) 0.05 % cream Apply 1 application topically 2 (two) times daily as needed (psoriasis).     ? BLACK CURRANT SEED OIL PO Take 2 tablets by mouth daily.    ? Cholecalciferol (VITAMIN D) 2000 units tablet Take 2,000 Units by mouth daily.    ? cyanocobalamin 2000 MCG tablet Take 2,000 mcg by mouth daily. Vitamin b12    ? diclofenac sodium (VOLTAREN) 1 % GEL Apply 2 g topically daily as needed (pain).     ? DULoxetine (CYMBALTA) 20 MG capsule Take 20 mg by mouth daily.    ? folic acid (FOLVITE) 1 MG tablet TAKE TWO TABLETS BY MOUTH ONCE DAILY 180 tablet 2  ? hydrocortisone (CORTEF) 5 MG tablet Take 5 mg by mouth 3 (three) times daily.    ? leflunomide (ARAVA) 10 MG tablet Take 2 tablets (20 mg total) by mouth daily. 60 tablet 2  ? Multiple Vitamin (MULTIVITAMIN WITH MINERALS) TABS tablet Take 1 tablet by mouth daily.    ? Omega-3 Fatty Acids (FISH OIL PO) Take by mouth daily.    ? ondansetron (ZOFRAN-ODT) 8 MG disintegrating tablet Take 1 tablet (8 mg total) by mouth every 8 (eight) hours as needed for nausea or  vomiting. 20 tablet 0  ? OVER THE COUNTER MEDICATION Take 0.5 each by mouth as needed. CBD gummies    ? predniSONE (DELTASONE) 10 MG tablet Take 1 tablet (10 mg total) by mouth daily with breakfast. 90 tablet 3  ? pregabalin (LYRICA) 75 MG capsule Take 75 mg by mouth 2 (two) times daily.    ? pyridOXINE (VITAMIN B-6) 100 MG tablet Take 100 mg by mouth daily.    ? solifenacin (VESICARE) 10 MG tablet Take 1 tablet (10 mg total) by mouth daily. 30 tablet 3  ? Testosterone 20.25 MG/ACT (1.62%) GEL     ? zinc gluconate 50 MG tablet Take 50 mg by mouth daily.    ? fentaNYL (DURAGESIC) 25 MCG/HR Place 1 patch onto the skin every 3 (three) days. (Patient not taking: Reported on 09/14/2021) 10 patch 0  ? Melatonin 10 MG TABS Take 10 mg by mouth at bedtime as needed. (Patient not taking: Reported on 10/19/2021)    ?   naloxone (NARCAN) nasal spray 4 mg/0.1 mL  (Patient not taking: Reported on 10/19/2021)    ? oxyCODONE-acetaminophen (PERCOCET) 10-325 MG tablet Take 1 tablet by mouth every 4 (four) hours as needed for pain. (Patient not taking: Reported on 10/19/2021) 90 tablet 0  ? sildenafil (VIAGRA) 50 MG tablet Take 1 tablet (50 mg total) by mouth daily as needed for erectile dysfunction. (Patient not taking: Reported on 10/19/2021) 10 tablet 0  ? ?No current facility-administered medications for this encounter.  ? ?Facility-Administered Medications Ordered in Other Encounters  ?Medication Dose Route Frequency Provider Last Rate Last Admin  ? 0.9 %  sodium chloride infusion   Intravenous Continuous [name], MD 500 mL/hr at 02/05/15 1510 New Bag at 02/05/15 1510  ? ? ?Physical Findings: ?The patient is in no acute distress. Patient is alert and oriented.  Walks with a significant limp due to arthritis in his left pelvis region. ? height is 6' (1.829 m) and weight is 258 lb 6.4 oz (117.2 kg). His temperature is 97.8 ?F (36.6 ?C). His blood pressure is 120/90 and his pulse is 97. His respiration is 18 and oxygen saturation is  99%. .   ? ?Lab Findings: ?Lab Results  ?Component Value Date  ? WBC 3.8 (L) 10/04/2021  ? HGB 12.9 (L) 10/04/2021  ? HCT 39.4 10/04/2021  ? MCV 92.3 10/04/2021  ? PLT 123 (L) 10/04/2021  ? ? ?Radiographic Find

## 2021-10-18 NOTE — Telephone Encounter (Signed)
RETURNED PATIENT'S WIFE'S PHONE CALL, LVM FOR A RETURN CALL 

## 2021-10-19 ENCOUNTER — Ambulatory Visit
Admission: RE | Admit: 2021-10-19 | Discharge: 2021-10-19 | Disposition: A | Discharge status: Home / Self Care | Payer: PPO | Source: Ambulatory Visit | Attending: Radiation Oncology | Admitting: Radiation Oncology

## 2021-10-19 ENCOUNTER — Encounter: Payer: Self-pay | Admitting: Radiation Oncology

## 2021-10-19 VITALS — BP 120/90 | HR 97 | Temp 97.8°F | Resp 18 | Ht 72.0 in | Wt 258.4 lb

## 2021-10-19 DIAGNOSIS — C7A8 Other malignant neuroendocrine tumors: Secondary | ICD-10-CM | POA: Diagnosis present

## 2021-10-19 DIAGNOSIS — C779 Secondary and unspecified malignant neoplasm of lymph node, unspecified: Secondary | ICD-10-CM | POA: Insufficient documentation

## 2021-10-19 DIAGNOSIS — C4371 Malignant melanoma of right lower limb, including hip: Secondary | ICD-10-CM | POA: Diagnosis present

## 2021-10-19 NOTE — Progress Notes (Signed)
James Byrd is here today for follow up post radiation to the abdomen. ? ?They completed their radiation on: 09/28/2021  ? ?Does the patient complain of any of the following: ? ?Pain:generalized joint pain ?Abdominal bloating: denies ?Diarrhea/Constipation: denies ?Nausea/Vomiting: nausea and vomiting, zofran improves nausea ?Blood in Urine or Stool: denies ?Urinary Issues (dysuria/incomplete emptying/ incontinence/ increased frequency/urgency): urgency, frequency, nocturia x 3-4, incontinence ?Post radiation skin changes: denies ? ? ?Additional comments if applicable: nothing of note ? ?Vitals:  ? 10/19/21 0909  ?BP: 120/90  ?Pulse: 97  ?Resp: 18  ?Temp: 97.8 ?F (36.6 ?C)  ?SpO2: 99%  ?Weight: 258 lb 6.4 oz (117.2 kg)  ?Height: 6' (1.829 m)  ? ?  ?

## 2021-10-20 NOTE — Progress Notes (Signed)
? ?Office Visit Note ? ?Patient: James Byrd             ?Date of Birth: February 17, 1966           ?MRN: [id number]             ?PCP: Street, Sharon Mt, MD ?Referring: Street, Sharon Mt, * ?Visit Date: 11/03/2021 ?Occupation: '@GUAROCC'$ @ ? ?Subjective:  ?Pain in multiple joints ? ?History of Present Illness: James Byrd is a 56 y.o. male with history of chronic inflammatory arthritis and osteoarthritis.  He is currently tolerating Arava 20 mg 1 tablet daily without any side effects.  He was started on [place] after his last office visit on 08/31/2021.  He has noticed minimal improvement since initiating therapy.  He remains on prednisone 10 mg daily.  He states that he has good days and bad days with his joint pain and inflammation.  On bad days he has difficulty getting out of bed due to severity of pain and stiffness in his joints.  His pain and inflammation have been most severe in his hands and wrists as well as both knees. ?He remains under the care of Dr. Marin Olp, Dr. Sondra Come, and Dr. Crisoforo Oxford for management of metastatic melanoma.  He reports that he has completed radiation therapy for there was discussion of possible surgery in the near future.  He had a recent appointment with the radiation oncologist Dr. Sondra Come on 10/19/2021.  He has an upcoming appointment scheduled on 11/23/2021 with Dr. Crisoforo Oxford, general surgery to further discuss the next steps in management.   ? ? ?Activities of Daily Living:  ?Patient reports morning stiffness for several hours.   ?Patient Denies nocturnal pain.  ?Difficulty dressing/grooming: Reports ?Difficulty climbing stairs: Reports ?Difficulty getting out of chair: Reports ?Difficulty using hands for taps, buttons, cutlery, and/or writing: Reports ? ?Review of Systems  ?Constitutional:  Positive for fatigue.  ?HENT:  Negative for mouth sores, mouth dryness and nose dryness.   ?Eyes:  Negative for pain, itching and dryness.  ?Respiratory:  Negative for shortness of breath and difficulty  breathing.   ?Cardiovascular:  Negative for chest pain and palpitations.  ?Gastrointestinal:  Negative for blood in stool, constipation and diarrhea.  ?Endocrine: Negative for increased urination.  ?Genitourinary:  Negative for difficulty urinating.  ?Musculoskeletal:  Positive for joint pain, joint pain, joint swelling and morning stiffness. Negative for myalgias, muscle tenderness and myalgias.  ?Skin:  Negative for color change, rash and redness.  ?Allergic/Immunologic: Negative for susceptible to infections.  ?Neurological:  Positive for numbness and weakness. Negative for dizziness, headaches and memory loss.  ?Hematological:  Positive for bruising/bleeding tendency.  ?Psychiatric/Behavioral:  Negative for confusion.   ? ?PMFS History:  ?Patient Active Problem List  ? Diagnosis Date Noted  ? Metastatic melanoma to lymph node ([place]) 12/16/2020  ? Pain in both feet 10/17/2019  ? Bilateral pulmonary embolism ([place]) 08/26/2019  ? Neuroendocrine carcinoma of pancreas ([place]) 05/22/2019  ? Severe sepsis with septic shock ([place]) 01/15/2019  ? Acute respiratory failure with hypoxia ([place]) 01/15/2019  ? AKI (acute kidney injury) ([place]) 01/15/2019  ? Septic shock ([place]) 01/13/2019  ? Goals of care, counseling/discussion 12/17/2018  ? Adrenal insufficiency ([place])   ? Refractory nausea and vomiting   ? Paranasal sinus disease   ? Acute renal failure syndrome (HCC)   ? HCAP (healthcare-associated pneumonia) 03/06/2015  ? Drug-induced pneumonitis - [facility]   ? Thrombocytopenia ([place]) 02/09/2015  ? HLD (hyperlipidemia)   ? Chronic diastolic congestive heart failure ([place])   ?  Malignant melanoma of right great toe Stage IIIB ([id number]) 11/14/2014  ? Obstructive sleep apnea 07/19/2012  ? Paroxysmal supraventricular tachycardia ([place]) 05/24/2012  ? Chest pressure 05/24/2012  ? Palpitations 05/24/2012  ? Anxiety 05/24/2012  ?  ?Past Medical History:  ?Diagnosis Date  ? Adrenal insufficiency ([place])   ? Anxiety 05/24/2012  ? Complication of  anesthesia   ? SLO TO AWAKEN MANY 8 TO 9 YRS AGO, NO ISSUES SINCE  ? Goals of care, counseling/discussion 12/17/2018  ? Gout   ? History of kidney stones   ? HLD (hyperlipidemia)   ? Malignant melanoma of right great toe ([place]) 11/14/2014  ? Neuroendocrine carcinoma of pancreas ([place]) 05/22/2019  ? Paroxysmal supraventricular tachycardia ([place]) 05/24/2012  ? Described in the treadmill report; details are pending   ? PE (pulmonary thromboembolism) ([place]) 08/2019  ? Rheumatoid arthritis ([place])   ? Sleep apnea   ? MILD NO CPAP NEEDED  ?  ?Family History  ?Adopted: Yes  ?Problem Relation Age of Onset  ? Stroke Mother   ? Breast cancer Mother   ? COPD Father   ? Diabetes Father   ? Dementia Father   ? Diabetes Sister   ? ?Past Surgical History:  ?Procedure Laterality Date  ? CHOLECYSTECTOMY    ? ESOPHAGOGASTRODUODENOSCOPY (EGD) WITH PROPOFOL N/A 05/13/2019  ? Procedure: ESOPHAGOGASTRODUODENOSCOPY (EGD) WITH PROPOFOL;  Surgeon: Arta Silence, MD;  Location: [facility] ENDOSCOPY;  Service: Endoscopy;  Laterality: N/A;  ? EXTRACORPOREAL SHOCK WAVE LITHOTRIPSY  2016  ? FINE NEEDLE ASPIRATION N/A 05/13/2019  ? Procedure: FINE NEEDLE ASPIRATION (FNA) LINEAR;  Surgeon: Arta Silence, MD;  Location: [facility] ENDOSCOPY;  Service: Endoscopy;  Laterality: N/A;  ? LYMPH NODE BIOPSY Right 11/06/2018  ? Procedure: RIGHT INGUINAL LYMPH NODE BIOPSY AND POSSIBLE RIGHT GROIN NODE DISECTION;  Surgeon: Alphonsa Overall, MD;  Location: [facility] ORS;  Service: General;  Laterality: Right;  ? r toe partial ambutation    ? ROTATOR CUFF REPAIR Right 2010  ? 3 yrs ago  ? UPPER ESOPHAGEAL ENDOSCOPIC ULTRASOUND (EUS) N/A 05/13/2019  ? Procedure: UPPER ESOPHAGEAL ENDOSCOPIC ULTRASOUND (EUS);  Surgeon: Arta Silence, MD;  Location: [facility];  Service: Endoscopy;  Laterality: N/A;  ? ?Social History  ? ?Social History Narrative  ? Lives with wife in a one story home.  Has 2 children.  On disability.  Education: high school.+  ? ?Immunization History  ?Administered Date(s)  Administered  ? Influenza,inj,Quad PF,6+ Mos 03/19/2019  ? Influenza-Unspecified 04/03/2014  ?  ? ?Objective: ?Vital Signs: BP 119/77 (BP Location: Right Arm, Patient Position: Sitting, Cuff Size: Normal)   Pulse 67   Ht 6' (1.829 m)   Wt 260 lb (117.9 kg)   BMI 35.26 kg/m?   ? ?Physical Exam ?Vitals and nursing note reviewed.  ?Constitutional:   ?   Appearance: He is well-developed.  ?HENT:  ?   Head: Normocephalic and atraumatic.  ?Eyes:  ?   Conjunctiva/sclera: Conjunctivae normal.  ?   Pupils: Pupils are equal, round, and reactive to light.  ?Cardiovascular:  ?   Rate and Rhythm: Normal rate and regular rhythm.  ?   Heart sounds: Normal heart sounds.  ?Pulmonary:  ?   Effort: Pulmonary effort is normal.  ?   Breath sounds: Normal breath sounds.  ?Abdominal:  ?   General: Bowel sounds are normal.  ?   Palpations: Abdomen is soft.  ?Musculoskeletal:  ?   Cervical back: Normal range of motion and neck supple.  ?Skin: ?   General:  Skin is warm and dry.  ?   Capillary Refill: Capillary refill takes less than 2 seconds.  ?Neurological:  ?   Mental Status: He is alert and oriented to person, place, and time.  ?Psychiatric:     ?   Behavior: Behavior normal.  ?  ? ?Musculoskeletal Exam: C-spine has good ROM.  Postural thoracic kyphosis noted.  No midline spinal tenderness or SI joint tenderness. Shoulder joints have good ROM.  Right elbow joint contracture noted.  Tenderness and warmth of the right elbow joint.  Extensor tenosynovitis of the left wrist.  Tenderness of the right wrist.  Some tenderness and synovitis of the right second and third MCP joints.  Warmth and synovitis of both knee joints.  Ankle joints have good ROM with warmth and tenderness of the left ankle joint.  ? ?CDAI Exam: ?CDAI Score: 9.2  ?Patient Global: 6 mm; Provider Global: 6 mm ?Swollen: 3 ; Tender: 6  ?Joint Exam 11/03/2021  ? ?   Right  Left  ?Elbow   Tender     ?Wrist   Tender  Swollen Tender  ?Knee  Swollen Tender  Swollen Tender   ?Ankle      Tender  ? ? ? ?Investigation: ?No additional findings. ? ?Imaging: ?No results found. ? ?Recent Labs: ?Lab Results  ?Component Value Date  ? WBC 3.8 (L) 10/04/2021  ? HGB 12.9 (L) 10/04/2021  ? PLT 123 (L)

## 2021-10-25 ENCOUNTER — Ambulatory Visit: Payer: PPO | Admitting: Physician Assistant

## 2021-10-28 ENCOUNTER — Ambulatory Visit: Payer: PPO | Admitting: Radiation Oncology

## 2021-10-31 ENCOUNTER — Other Ambulatory Visit: Payer: Self-pay | Admitting: Hematology & Oncology

## 2021-11-01 ENCOUNTER — Encounter: Payer: Self-pay | Admitting: Hematology & Oncology

## 2021-11-03 ENCOUNTER — Encounter: Payer: Self-pay | Admitting: Physician Assistant

## 2021-11-03 ENCOUNTER — Ambulatory Visit: Payer: PPO | Admitting: Physician Assistant

## 2021-11-03 VITALS — BP 119/77 | HR 67 | Ht 72.0 in | Wt 260.0 lb

## 2021-11-03 DIAGNOSIS — J984 Other disorders of lung: Secondary | ICD-10-CM

## 2021-11-03 DIAGNOSIS — R6521 Severe sepsis with septic shock: Secondary | ICD-10-CM

## 2021-11-03 DIAGNOSIS — M170 Bilateral primary osteoarthritis of knee: Secondary | ICD-10-CM

## 2021-11-03 DIAGNOSIS — M1990 Unspecified osteoarthritis, unspecified site: Secondary | ICD-10-CM | POA: Diagnosis not present

## 2021-11-03 DIAGNOSIS — M19072 Primary osteoarthritis, left ankle and foot: Secondary | ICD-10-CM

## 2021-11-03 DIAGNOSIS — I471 Supraventricular tachycardia, unspecified: Secondary | ICD-10-CM

## 2021-11-03 DIAGNOSIS — M19041 Primary osteoarthritis, right hand: Secondary | ICD-10-CM | POA: Diagnosis not present

## 2021-11-03 DIAGNOSIS — T50905A Adverse effect of unspecified drugs, medicaments and biological substances, initial encounter: Secondary | ICD-10-CM

## 2021-11-03 DIAGNOSIS — J189 Pneumonia, unspecified organism: Secondary | ICD-10-CM

## 2021-11-03 DIAGNOSIS — C4371 Malignant melanoma of right lower limb, including hip: Secondary | ICD-10-CM

## 2021-11-03 DIAGNOSIS — C7A8 Other malignant neuroendocrine tumors: Secondary | ICD-10-CM

## 2021-11-03 DIAGNOSIS — G4733 Obstructive sleep apnea (adult) (pediatric): Secondary | ICD-10-CM

## 2021-11-03 DIAGNOSIS — Z79899 Other long term (current) drug therapy: Secondary | ICD-10-CM | POA: Diagnosis not present

## 2021-11-03 DIAGNOSIS — M19042 Primary osteoarthritis, left hand: Secondary | ICD-10-CM

## 2021-11-03 DIAGNOSIS — E2740 Unspecified adrenocortical insufficiency: Secondary | ICD-10-CM

## 2021-11-03 DIAGNOSIS — M19071 Primary osteoarthritis, right ankle and foot: Secondary | ICD-10-CM

## 2021-11-03 DIAGNOSIS — I2699 Other pulmonary embolism without acute cor pulmonale: Secondary | ICD-10-CM

## 2021-11-03 DIAGNOSIS — A419 Sepsis, unspecified organism: Secondary | ICD-10-CM

## 2021-11-03 DIAGNOSIS — Z8639 Personal history of other endocrine, nutritional and metabolic disease: Secondary | ICD-10-CM

## 2021-11-04 ENCOUNTER — Other Ambulatory Visit: Payer: Self-pay | Admitting: *Deleted

## 2021-11-04 DIAGNOSIS — C7A8 Other malignant neuroendocrine tumors: Secondary | ICD-10-CM

## 2021-11-04 DIAGNOSIS — E2740 Unspecified adrenocortical insufficiency: Secondary | ICD-10-CM

## 2021-11-04 DIAGNOSIS — M1990 Unspecified osteoarthritis, unspecified site: Secondary | ICD-10-CM

## 2021-11-04 DIAGNOSIS — Z79899 Other long term (current) drug therapy: Secondary | ICD-10-CM

## 2021-11-04 DIAGNOSIS — M19041 Primary osteoarthritis, right hand: Secondary | ICD-10-CM

## 2021-11-04 DIAGNOSIS — C4371 Malignant melanoma of right lower limb, including hip: Secondary | ICD-10-CM

## 2021-11-04 LAB — CBC WITH DIFFERENTIAL/PLATELET
Absolute Monocytes: 299 cells/uL (ref 200–950)
Basophils Absolute: 13 cells/uL (ref 0–200)
Basophils Relative: 0.2 %
Eosinophils Absolute: 20 cells/uL (ref 15–500)
Eosinophils Relative: 0.3 %
HCT: 37.1 % — ABNORMAL LOW (ref 38.5–50.0)
Hemoglobin: 12.8 g/dL — ABNORMAL LOW (ref 13.2–17.1)
Lymphs Abs: 208 cells/uL — ABNORMAL LOW (ref 850–3900)
MCH: 31.2 pg (ref 27.0–33.0)
MCHC: 34.5 g/dL (ref 32.0–36.0)
MCV: 90.5 fL (ref 80.0–100.0)
MPV: 9.5 fL (ref 7.5–12.5)
Monocytes Relative: 4.6 %
Neutro Abs: 5961 cells/uL (ref 1500–7800)
Neutrophils Relative %: 91.7 %
Platelets: 196 10*3/uL (ref 140–400)
RBC: 4.1 10*6/uL — ABNORMAL LOW (ref 4.20–5.80)
RDW: 15.7 % — ABNORMAL HIGH (ref 11.0–15.0)
Total Lymphocyte: 3.2 %
WBC: 6.5 10*3/uL (ref 3.8–10.8)

## 2021-11-04 LAB — COMPLETE METABOLIC PANEL WITH GFR
AG Ratio: 1.6 (calc) (ref 1.0–2.5)
ALT: 16 U/L (ref 9–46)
AST: 15 U/L (ref 10–35)
Albumin: 4.4 g/dL (ref 3.6–5.1)
Alkaline phosphatase (APISO): 66 U/L (ref 35–144)
BUN: 20 mg/dL (ref 7–25)
CO2: 23 mmol/L (ref 20–32)
Calcium: 9.6 mg/dL (ref 8.6–10.3)
Chloride: 105 mmol/L (ref 98–110)
Creat: 0.79 mg/dL (ref 0.70–1.30)
Globulin: 2.8 g/dL (calc) (ref 1.9–3.7)
Glucose, Bld: 94 mg/dL (ref 65–99)
Potassium: 4.2 mmol/L (ref 3.5–5.3)
Sodium: 140 mmol/L (ref 135–146)
Total Bilirubin: 0.4 mg/dL (ref 0.2–1.2)
Total Protein: 7.2 g/dL (ref 6.1–8.1)
eGFR: 105 mL/min/{1.73_m2} (ref >=60)

## 2021-11-04 NOTE — Progress Notes (Signed)
CMP WNL.   Anemia is stable.   Total WBC count has returned to WNL.  Absolute lymphocytes remain low-stable.  Reviewed with Dr. Estanislado Pandy.  Ok to continue on current dose of arava 20 mg daily.   Recheck CBC and CMP in 3 months or sooner if desired by Dr. Marin Olp.

## 2021-11-19 ENCOUNTER — Other Ambulatory Visit: Payer: Self-pay | Admitting: Hematology & Oncology

## 2021-12-02 ENCOUNTER — Encounter: Payer: Self-pay | Admitting: Hematology & Oncology

## 2021-12-03 ENCOUNTER — Encounter: Payer: Self-pay | Admitting: Hematology & Oncology

## 2021-12-03 NOTE — Telephone Encounter (Signed)
Spoke with wife who wanted to know if Dr Marin Olp had been in touch with Dr Crisoforo Oxford. Pt is scheduled for surgery 12/15/21 to remove both tumors. Pt will be in the hospital about 1 week and will recover at home x 2 months. Pt is to check back with Dr Marin Olp after surgery to see if he needs chemotherapy or other tx. A CT was complete at [facility] and it showed the tumors had not grown or shrunk. Dr Crisoforo Oxford is going to try and remove what he can. Dr Marin Olp notified and states he will contact pt.

## 2021-12-06 ENCOUNTER — Other Ambulatory Visit: Payer: Self-pay | Admitting: Physician Assistant

## 2021-12-06 NOTE — Telephone Encounter (Signed)
Next Visit: 01/19/2022  Last Visit: 11/03/2021  Last Fill: 09/22/2021  DX:  Chronic inflammatory arthritis   Current Dose per office note 11/03/2021: Arava 20 mg 1 tablet by mouth daily  Labs: 11/03/2021, CMP WNL.   Anemia is stable.   Total WBC count has returned to WNL.  Absolute lymphocytes remain low-stable.  Reviewed with Dr. Estanislado Pandy.  Ok to continue on current dose of arava 20 mg daily.   Recheck CBC and CMP in 3 months or sooner if desired by Dr. Marin Olp.    Okay to refill Arava?

## 2022-01-05 NOTE — Progress Notes (Deleted)
Office Visit Note  Patient: James Byrd             Date of Birth: [birth year]           MRN: [id number]             PCP: Emmaline Kluver, MD Referring: Venetia Maxon Sharon Mt, * Visit Date: 01/19/2022 Occupation: '@GUAROCC'$ @  Subjective:  No chief complaint on file.   History of Present Illness: [name] is a 56 y.o. male ***   Activities of Daily Living:  Patient reports morning stiffness for *** {minute/hour:19697}.   Patient {ACTIONS;DENIES/REPORTS:21021675::"Denies"} nocturnal pain.  Difficulty dressing/grooming: {ACTIONS;DENIES/REPORTS:21021675::"Denies"} Difficulty climbing stairs: {ACTIONS;DENIES/REPORTS:21021675::"Denies"} Difficulty getting out of chair: {ACTIONS;DENIES/REPORTS:21021675::"Denies"} Difficulty using hands for taps, buttons, cutlery, and/or writing: {ACTIONS;DENIES/REPORTS:21021675::"Denies"}  No Rheumatology ROS completed.   PMFS History:  Patient Active Problem List   Diagnosis Date Noted  . Metastatic melanoma to lymph node ([place]) 12/16/2020  . Pain in both feet 10/17/2019  . Bilateral pulmonary embolism ([place]) 08/26/2019  . Neuroendocrine carcinoma of pancreas ([place]) 05/22/2019  . Severe sepsis with septic shock ([place]) 01/15/2019  . Acute respiratory failure with hypoxia ([place]) 01/15/2019  . AKI (acute kidney injury) ([place]) 01/15/2019  . Septic shock ([place]) 01/13/2019  . Goals of care, counseling/discussion 12/17/2018  . Adrenal insufficiency ([place])   . Refractory nausea and vomiting   . Paranasal sinus disease   . Acute renal failure syndrome ([place])   . HCAP (healthcare-associated pneumonia) 03/06/2015  . Drug-induced pneumonitis - VERVOY   . Thrombocytopenia ([place]) 02/09/2015  . HLD (hyperlipidemia)   . Chronic diastolic congestive heart failure ([place])   . Malignant melanoma of right great toe Stage IIIB ([id number]) 11/14/2014  . Obstructive sleep apnea 07/19/2012  . Paroxysmal supraventricular tachycardia ([place]) 05/24/2012  . Chest  pressure 05/24/2012  . Palpitations 05/24/2012  . Anxiety 05/24/2012    Past Medical History:  Diagnosis Date  . Adrenal insufficiency ([place])   . Anxiety 05/24/2012  . Complication of anesthesia    SLO TO AWAKEN MANY 8 TO 9 YRS AGO, NO ISSUES SINCE  . Goals of care, counseling/discussion 12/17/2018  . Gout   . History of kidney stones   . HLD (hyperlipidemia)   . Malignant melanoma of right great toe ([place]) 11/14/2014  . Neuroendocrine carcinoma of pancreas ([place]) 05/22/2019  . Paroxysmal supraventricular tachycardia ([place]) 05/24/2012   Described in the treadmill report; details are pending   . PE (pulmonary thromboembolism) ([place]) 08/2019  . Rheumatoid arthritis ([place])   . Sleep apnea    MILD NO CPAP NEEDED    Family History  Adopted: Yes  Problem Relation Age of Onset  . Stroke Mother   . Breast cancer Mother   . COPD Father   . Diabetes Father   . Dementia Father   . Diabetes Sister    Past Surgical History:  Procedure Laterality Date  . CHOLECYSTECTOMY    . ESOPHAGOGASTRODUODENOSCOPY (EGD) WITH PROPOFOL N/A 05/13/2019   Procedure: ESOPHAGOGASTRODUODENOSCOPY (EGD) WITH PROPOFOL;  Surgeon: Arta Silence, MD;  Location: [facility];  Service: Endoscopy;  Laterality: N/A;  . EXTRACORPOREAL SHOCK WAVE LITHOTRIPSY  2016  . FINE NEEDLE ASPIRATION N/A 05/13/2019   Procedure: FINE NEEDLE ASPIRATION (FNA) LINEAR;  Surgeon: Arta Silence, MD;  [facility];  Service: Endoscopy;  Laterality: N/A;  . LYMPH NODE BIOPSY Right 11/06/2018   Procedure: RIGHT INGUINAL LYMPH NODE BIOPSY AND POSSIBLE RIGHT GROIN NODE DISECTION;  Surgeon: Alphonsa Overall, MD;  Location: [facility];  Service: General;  Laterality:  Right;  . r toe partial ambutation    . ROTATOR CUFF REPAIR Right 2010   3 yrs ago  . UPPER ESOPHAGEAL ENDOSCOPIC ULTRASOUND (EUS) N/A 05/13/2019   Procedure: UPPER ESOPHAGEAL ENDOSCOPIC ULTRASOUND (EUS);  Surgeon: Arta Silence, MD;  Location: [facility];  Service: Endoscopy;   Laterality: N/A;   Social History   Social History Narrative   Lives with wife in a one story home.  Has 2 children.  On disability.  Education: high school.+   Immunization History  Administered Date(s) Administered  . Influenza,inj,Quad PF,6+ Mos 03/19/2019  . Influenza-Unspecified 04/03/2014     Objective: Vital Signs: There were no vitals taken for this visit.   Physical Exam   Musculoskeletal Exam: ***  CDAI Exam: CDAI Score: -- Patient Global: --; Provider Global: -- Swollen: --; Tender: -- Joint Exam 01/19/2022   No joint exam has been documented for this visit   There is currently no information documented on the homunculus. Go to the Rheumatology activity and complete the homunculus joint exam.  Investigation: No additional findings.  Imaging: No results found.  Recent Labs: Lab Results  Component Value Date   WBC 6.5 11/03/2021   HGB 12.8 (L) 11/03/2021   PLT 196 11/03/2021   NA 140 11/03/2021   K 4.2 11/03/2021   CL 105 11/03/2021   CO2 23 11/03/2021   GLUCOSE 94 11/03/2021   BUN 20 11/03/2021   CREATININE 0.79 11/03/2021   BILITOT 0.4 11/03/2021   ALKPHOS 75 10/04/2021   AST 15 11/03/2021   ALT 16 11/03/2021   PROT 7.2 11/03/2021   ALBUMIN 4.2 10/04/2021   CALCIUM 9.6 11/03/2021   GFRAA 105 06/04/2020   QFTBGOLDPLUS NEGATIVE 03/25/2020    Speciality Comments: No specialty comments available.  Procedures:  No procedures performed Allergies: Patient has no known allergies.   Assessment / Plan:     Visit Diagnoses: No diagnosis found.  Orders: No orders of the defined types were placed in this encounter.  No orders of the defined types were placed in this encounter.   Face-to-face time spent with patient was *** minutes. Greater than 50% of time was spent in counseling and coordination of care.  Follow-Up Instructions: No follow-ups on file.   Earnestine Mealing, CMA  Note - This record has been created using Editor, commissioning.   Chart creation errors have been sought, but may not always  have been located. Such creation errors do not reflect on  the standard of medical care.

## 2022-01-09 NOTE — Progress Notes (Signed)
Radiation Oncology         [phone] ________________________________  Outpatient Re-Consultation  Name: James Byrd MRN: [id number]  Date: 01/10/2022  DOB: Mar 19, 1966  FV:OHKGOV, Sharon Mt, MD  [name], MD   REFERRING PHYSICIAN: [name], MD  DIAGNOSIS: The primary encounter diagnosis was Malignant melanoma of right great toe Stage IIIB [phone]). A diagnosis of Lung nodules was also pertinent to this visit.  Metastatic melanoma; new metastatic pulmonary nodules in the RUL and LLL   The primary encounter diagnosis was Malignant melanoma of right great toe Stage IIIB ([id number]). Diagnoses of Neuroendocrine carcinoma of pancreas ([place]) and Metastatic melanoma to lymph node [facility]) were also pertinent to this visit.   Stage IIIB (T3bN1aM0) subungual melanoma of the right hallux; nodal recurrence in the right inguinal nodes; BRAF with right common iliac node recurrence; progression of melanoma-lymph node metastasis 02/2021   Neuroendocrine carcinoma of the pancreas  Interval Since Last Radiation: 3 months and 13 days   3) Intent: Palliative     Radiation Treatment Dates: 09/15/2021 through 09/28/2021 Site Technique Total Dose (Gy) Dose per Fx (Gy) Completed Fx Beam Energies  Internal Iliac Nodes: Pelvis IMRT 30/30 3 10/10 6X  Abdomen: Abd IMRT 30/30 3 10/10 6X      2) 12/29/20 - 01/08/21: The right common iliac lymph node and a left gluteal nodule were treated to 50 Gy in 5 fractions of 10 Gy (Dr. Tammi Klippel)     1) Right inguinal lymph nodes : 12/27/2018 through 01/29/2019  Site Technique Total Dose Dose per Fx Completed Fx Beam Energies  Extremity: Ext_Rt_rt ingu 3D 48/48 2.4 20/20 6X, 10X    HISTORY OF PRESENT ILLNESS::James Byrd is a 56 y.o. male who is accompanied by his wife. he is seen as a courtesy of Dr. Marin Olp for an opinion concerning radiation therapy as part of management for his diagnosed metastatic melanoma.   The patient was last seen for  follow up on 10/19/21. Since that time, the patient underwent resectioning of the right pelvic lymph node on 12/16/21 under the care of Dr. Crisoforo Oxford. Pathology from the procedure revealed  no evidence of metastatic melanoma (with predominant necrotic fibroadipose tissue with histiocytic inflammation and hemosiderin deposit).  Shortly after his procedure, the patient presented to the ED on 12/25/21 with redness and drainage from a surgical site. On examination, surrounding erythema from the surgical site was appreciated, without induration or fluctuance. Labs were negative and the patient was started on Keflex.   The patient presented for a routine follow CT of the chest abdomen and pelvis on 11/23/21 which revealed multiple new metastatic pulmonary nodules in the right upper lobe, with the largest measuring approximately 2.3 x 2.4 cm. An additional metastatic lesion in the left lower lobe was also appreciated, measuring 2.3 x 2.7 cm. CT also showed a decrease in size of the necrotic left para-aortic node mass, and no significant change in size of the necrotic right external iliac nodal mass.  CT of the abdomen and pelvis performed on 12/26/21 for evaluation of acute onset abdominal pain showed indeterminate fluid in the resection bed in the right hemipelvis with locules of gas and questionable wall enhancement. Differential considerations were noted to include post-op related etiologies vs infection.    Since completion of his palliative surgery, he is able to urinate easier and with much less frequency and urgency.  He denies any pain within the pelvis or abdomen area.  He reports soreness along his surgical scar which  is healing by secondary intent.  Denies any significant cough or breathing problems.  He denies any hemoptysis.    PAST MEDICAL HISTORY:  Past Medical History:  Diagnosis Date   Adrenal insufficiency ([place])    Anxiety 04/28/1477   Complication of anesthesia    SLO TO AWAKEN MANY 8 TO 9 YRS  AGO, NO ISSUES SINCE   Goals of care, counseling/discussion 12/17/2018   Gout    History of kidney stones    HLD (hyperlipidemia)    Malignant melanoma of right great toe ([place]) 11/14/2014   Neuroendocrine carcinoma of pancreas ([place]) 05/22/2019   Paroxysmal supraventricular tachycardia ([place]) 05/24/2012   Described in the treadmill report; details are pending    PE (pulmonary thromboembolism) ([place]) 08/2019   Rheumatoid arthritis ([place])    Sleep apnea    MILD NO CPAP NEEDED    PAST SURGICAL HISTORY: Past Surgical History:  Procedure Laterality Date   CHOLECYSTECTOMY     ESOPHAGOGASTRODUODENOSCOPY (EGD) WITH PROPOFOL N/A 05/13/2019   Procedure: ESOPHAGOGASTRODUODENOSCOPY (EGD) WITH PROPOFOL;  Surgeon: Arta Silence, MD;  [facility];  Service: Endoscopy;  Laterality: N/A;   EXTRACORPOREAL SHOCK WAVE LITHOTRIPSY  2016   FINE NEEDLE ASPIRATION N/A 05/13/2019   Procedure: FINE NEEDLE ASPIRATION (FNA) LINEAR;  Surgeon: Arta Silence, MD;  [facility];  Service: Endoscopy;  Laterality: N/A;   LYMPH NODE BIOPSY Right 11/06/2018   Procedure: RIGHT INGUINAL LYMPH NODE BIOPSY AND POSSIBLE RIGHT GROIN NODE DISECTION;  Surgeon: Alphonsa Overall, MD;  [facility];  Service: General;  Laterality: Right;   r toe partial ambutation     ROTATOR CUFF REPAIR Right 2010   3 yrs ago   UPPER ESOPHAGEAL ENDOSCOPIC ULTRASOUND (EUS) N/A 05/13/2019   Procedure: UPPER ESOPHAGEAL ENDOSCOPIC ULTRASOUND (EUS);  Surgeon: Arta Silence, MD;  Location: [facility];  Service: Endoscopy;  Laterality: N/A;    FAMILY HISTORY:  Family History  Adopted: Yes  Problem Relation Age of Onset   Stroke Mother    Breast cancer Mother    COPD Father    Diabetes Father    Dementia Father    Diabetes Sister     SOCIAL HISTORY:  Social History   Tobacco Use   Smoking status: Never    Passive exposure: Never   Smokeless tobacco: Never  Vaping Use   Vaping Use: Never used  Substance Use  Topics   Alcohol use: No    Alcohol/week: 0.0 standard drinks of alcohol   Drug use: No    ALLERGIES: No Known Allergies  MEDICATIONS:  Current Outpatient Medications  Medication Sig Dispense Refill   acetaminophen (TYLENOL) 650 MG CR tablet Take 1,300 mg by mouth daily as needed for pain.     Ascorbic Acid (VITAMIN C) 1000 MG tablet Take 1,000 mg by mouth daily.     augmented betamethasone dipropionate (DIPROLENE-AF) 0.05 % cream Apply 1 application topically 2 (two) times daily as needed (psoriasis).      BLACK CURRANT SEED OIL PO Take 2 tablets by mouth daily.     Cholecalciferol (VITAMIN D) 2000 units tablet Take 2,000 Units by mouth daily.     cyanocobalamin 2000 MCG tablet Take 2,000 mcg by mouth daily. Vitamin b12     diclofenac sodium (VOLTAREN) 1 % GEL Apply 2 g topically daily as needed (pain).      DULoxetine (CYMBALTA) 20 MG capsule Take 20 mg by mouth daily.     fentaNYL (DURAGESIC) 25 MCG/HR Place 1 patch onto the skin every  3 (three) days. (Patient not taking: Reported on 11/03/2021) 10 patch 0   folic acid (FOLVITE) 1 MG tablet TAKE TWO TABLETS BY MOUTH ONCE DAILY 180 tablet 2   hydrocortisone (CORTEF) 5 MG tablet Take 5 mg by mouth 3 (three) times daily.     leflunomide (ARAVA) 10 MG tablet TAKE TWO TABLETS BY MOUTH ONCE DAILY 60 tablet 2   Melatonin 10 MG TABS Take 10 mg by mouth at bedtime as needed.     Multiple Vitamin (MULTIVITAMIN WITH MINERALS) TABS tablet Take 1 tablet by mouth daily.     naloxone (NARCAN) nasal spray 4 mg/0.1 mL  (Patient not taking: Reported on 10/19/2021)     Omega-3 Fatty Acids (FISH OIL PO) Take by mouth daily.     ondansetron (ZOFRAN-ODT) 8 MG disintegrating tablet Take 1 tablet (8 mg total) by mouth every 8 (eight) hours as needed for nausea or vomiting. 20 tablet 0   OVER THE COUNTER MEDICATION Take 0.5 each by mouth as needed. CBD gummies     oxyCODONE-acetaminophen (PERCOCET) 10-325 MG tablet Take 1 tablet by mouth every 4 (four) hours as  needed for pain. 90 tablet 0   predniSONE (DELTASONE) 10 MG tablet Take 1 tablet (10 mg total) by mouth daily with breakfast. 90 tablet 3   pregabalin (LYRICA) 75 MG capsule TAKE ONE CAPSULE BY MOUTH TWICE DAILY 60 capsule 3   pyridOXINE (VITAMIN B-6) 100 MG tablet Take 100 mg by mouth daily.     sildenafil (VIAGRA) 50 MG tablet Take 1 tablet (50 mg total) by mouth daily as needed for erectile dysfunction. (Patient not taking: Reported on 10/19/2021) 10 tablet 0   solifenacin (VESICARE) 10 MG tablet Take 1 tablet (10 mg total) by mouth daily. (Patient not taking: Reported on 11/03/2021) 30 tablet 3   Testosterone 20.25 MG/ACT (1.62%) GEL      zinc gluconate 50 MG tablet Take 50 mg by mouth daily.     No current facility-administered medications for this encounter.   Facility-Administered Medications Ordered in Other Encounters  Medication Dose Route Frequency Provider Last Rate Last Admin   0.9 %  sodium chloride infusion   Intravenous Continuous [name], MD 500 mL/hr at 02/05/15 1510 New Bag at 02/05/15 1510    REVIEW OF SYSTEMS:  A 10+ POINT REVIEW OF SYSTEMS WAS OBTAINED including neurology, dermatology, psychiatry, cardiac, respiratory, lymph, extremities, GI, GU, musculoskeletal, constitutional, reproductive, HEENT.    PHYSICAL EXAM:  height is 6' (1.829 m) and weight is 261 lb (118.4 kg). His oral temperature is 98.1 F (36.7 C). His blood pressure is 139/94 (abnormal) and his pulse is 64. His respiration is 18 and oxygen saturation is 98%.   General: Alert and oriented, in no acute distress HEENT: Head is normocephalic. Extraocular movements are intact. Oropharynx is clear. Neck: Neck is supple, no palpable cervical or supraclavicular lymphadenopathy. Heart: Regular in rate and rhythm with no murmurs, rubs, or gallops. Chest: Clear to auscultation bilaterally, with no rhonchi, wheezes, or rales. Abdomen: Soft, nontender, nondistended, with no rigidity or guarding.  Scar inferior  to the umbilicus which is healing by secondary intent. Extremities: No cyanosis or edema. Lymphatics: see Neck Exam Skin: No concerning lesions. Musculoskeletal: symmetric strength and muscle tone throughout. Neurologic: Cranial nerves II through XII are grossly intact. No obvious focalities. Speech is fluent. Coordination is intact. Psychiatric: Judgment and insight are intact. Affect is appropriate.   ECOG = 1  0 - Asymptomatic (Fully active, able to carry  on all predisease activities without restriction)  1 - Symptomatic but completely ambulatory (Restricted in physically strenuous activity but ambulatory and able to carry out work of a light or sedentary nature. For example, light housework, office work)  2 - Symptomatic, <50% in bed during the day (Ambulatory and capable of all self care but unable to carry out any work activities. Up and about more than 50% of waking hours)  3 - Symptomatic, >50% in bed, but not bedbound (Capable of only limited self-care, confined to bed or chair 50% or more of waking hours)  4 - Bedbound (Completely disabled. Cannot carry on any self-care. Totally confined to bed or chair)  5 - Death   Eustace Pen MM, Creech RH, Tormey DC, et al. [phone]). "Toxicity and response criteria of the [facility]". [place] Oncol. 5 (6): 649-55  LABORATORY DATA:  Lab Results  Component Value Date   WBC 6.5 11/03/2021   HGB 12.8 (L) 11/03/2021   HCT 37.1 (L) 11/03/2021   MCV 90.5 11/03/2021   PLT 196 11/03/2021   NEUTROABS 5,961 11/03/2021   Lab Results  Component Value Date   NA 140 11/03/2021   K 4.2 11/03/2021   CL 105 11/03/2021   CO2 23 11/03/2021   GLUCOSE 94 11/03/2021   BUN 20 11/03/2021   CREATININE 0.79 11/03/2021   CALCIUM 9.6 11/03/2021      RADIOGRAPHY: No results found.    IMPRESSION: Metastatic melanoma; new metastatic pulmonary nodules in the RUL and LLL   The primary encounter diagnosis was Malignant melanoma of  right great toe Stage IIIB ([id number]). Diagnoses of Neuroendocrine carcinoma of pancreas ([place]) and Metastatic melanoma to lymph node [facility]) were also pertinent to this visit.   Stage IIIB (T3bN1aM0) subungual melanoma of the right hallux; nodal recurrence in the right inguinal nodes; BRAF with right common iliac node recurrence; progression of melanoma-lymph node metastasis 02/2021   Neuroendocrine carcinoma of the pancreas  He would be a good candidate for radiation therapy directed to his right upper lobe pulmonary nodule and left lower lobe pulmonary nodule.  The lesion in the right upper lobe appears to be bilobed and we will cover this entire lesion with his treatments.  May be a potential candidate for SBRT for these areas but will need to complete planning prior to making this decision.  If he is not a candidate for SBRT then he would be a candidate for ultra hypofractionated accelerated radiation therapy over approximately 10 treatments.    We reviewed the anticipated acute and late sequelae associated with radiation in this setting.  The patient was encouraged to ask questions that I answered to the best of my ability.  A patient consent form was discussed and signed.  We retained a copy for our records.  The patient would like to proceed with radiation and will be scheduled for CT simulation.  PLAN: Return for CT simulation later this week with treatments to begin approximately a week later.  Anticipate between 5 and 10 radiation treatments.   35 minutes of total time was spent for this patient encounter, including preparation, face-to-face counseling with the patient and coordination of care, physical exam, and documentation of the encounter.   ------------------------------------------------  Blair Promise, PhD, MD  This document serves as a record of services personally performed by Gery Pray, MD. It was created on his behalf by Roney Mans, a trained medical scribe. The  creation of this record is based on the scribe's  personal observations and the provider's statements to them. This document has been checked and approved by the attending provider.

## 2022-01-10 ENCOUNTER — Other Ambulatory Visit: Payer: Self-pay

## 2022-01-10 ENCOUNTER — Other Ambulatory Visit: Payer: Self-pay | Admitting: Oncology

## 2022-01-10 ENCOUNTER — Ambulatory Visit
Admission: RE | Admit: 2022-01-10 | Discharge: 2022-01-10 | Disposition: A | Discharge status: Home / Self Care | Payer: PPO | Source: Ambulatory Visit | Attending: Radiation Oncology | Admitting: Radiation Oncology

## 2022-01-10 ENCOUNTER — Encounter: Payer: Self-pay | Admitting: Radiation Oncology

## 2022-01-10 ENCOUNTER — Ambulatory Visit
Admission: RE | Admit: 2022-01-10 | Discharge: 2022-01-10 | Disposition: A | Discharge status: Home / Self Care | Payer: Self-pay | Source: Ambulatory Visit | Attending: Radiation Oncology | Admitting: Radiation Oncology

## 2022-01-10 ENCOUNTER — Telehealth: Payer: Self-pay | Admitting: Hematology & Oncology

## 2022-01-10 VITALS — BP 139/94 | HR 64 | Temp 98.1°F | Resp 18 | Ht 72.0 in | Wt 261.0 lb

## 2022-01-10 DIAGNOSIS — Z87442 Personal history of urinary calculi: Secondary | ICD-10-CM | POA: Insufficient documentation

## 2022-01-10 DIAGNOSIS — Z7952 Long term (current) use of systemic steroids: Secondary | ICD-10-CM | POA: Diagnosis not present

## 2022-01-10 DIAGNOSIS — G4730 Sleep apnea, unspecified: Secondary | ICD-10-CM | POA: Diagnosis not present

## 2022-01-10 DIAGNOSIS — R918 Other nonspecific abnormal finding of lung field: Secondary | ICD-10-CM

## 2022-01-10 DIAGNOSIS — Z79899 Other long term (current) drug therapy: Secondary | ICD-10-CM | POA: Diagnosis not present

## 2022-01-10 DIAGNOSIS — C4371 Malignant melanoma of right lower limb, including hip: Secondary | ICD-10-CM

## 2022-01-10 DIAGNOSIS — E785 Hyperlipidemia, unspecified: Secondary | ICD-10-CM | POA: Diagnosis not present

## 2022-01-10 DIAGNOSIS — C7801 Secondary malignant neoplasm of right lung: Secondary | ICD-10-CM | POA: Diagnosis present

## 2022-01-10 DIAGNOSIS — Z7969 Long term (current) use of other immunomodulators and immunosuppressants: Secondary | ICD-10-CM | POA: Insufficient documentation

## 2022-01-10 DIAGNOSIS — Z803 Family history of malignant neoplasm of breast: Secondary | ICD-10-CM | POA: Diagnosis not present

## 2022-01-10 DIAGNOSIS — M069 Rheumatoid arthritis, unspecified: Secondary | ICD-10-CM | POA: Insufficient documentation

## 2022-01-10 DIAGNOSIS — E2740 Unspecified adrenocortical insufficiency: Secondary | ICD-10-CM | POA: Insufficient documentation

## 2022-01-10 DIAGNOSIS — C7802 Secondary malignant neoplasm of left lung: Secondary | ICD-10-CM | POA: Diagnosis not present

## 2022-01-10 DIAGNOSIS — Z86711 Personal history of pulmonary embolism: Secondary | ICD-10-CM | POA: Insufficient documentation

## 2022-01-10 NOTE — Progress Notes (Signed)
Histology and Location of Primary Cancer: metastatic melanoma  Location(s) of Symptomatic tumor(s): metastatic nodules in right upper lobe and left lower lobe  Past/Anticipated chemotherapy by medical oncology, if any: will meet with Dr. Marin Olp on 01/20/22.  Pain on a scale of 0-10 is: 0   Ambulatory status? [place]? Wheelchair?: ambulatory  SAFETY ISSUES: Prior radiation? Internal iliac nodes and abdomen 30 Gy 09/15/2021 - 09/28/2021 Pacemaker/ICD? no Possible current pregnancy? no Is the patient on methotrexate? no  Additional Complaints / other details:  Denies having any shortness of breath or cough.  BP (!) 139/94 (BP Location: Left Arm, Patient Position: Sitting)   Pulse 64   Temp 98.1 F (36.7 C) (Oral)   Resp 18   Ht 6' (1.829 m)   Wt 261 lb (118.4 kg)   SpO2 98%   BMI 35.40 kg/m

## 2022-01-10 NOTE — Telephone Encounter (Signed)
Called to schedule per 7/21 sch msg , left voicemail for patient to call us back to schedule

## 2022-01-13 ENCOUNTER — Ambulatory Visit
Admission: RE | Admit: 2022-01-13 | Discharge: 2022-01-13 | Disposition: A | Discharge status: Home / Self Care | Payer: PPO | Source: Ambulatory Visit | Attending: Radiation Oncology | Admitting: Radiation Oncology

## 2022-01-13 DIAGNOSIS — C4371 Malignant melanoma of right lower limb, including hip: Secondary | ICD-10-CM | POA: Diagnosis present

## 2022-01-19 ENCOUNTER — Other Ambulatory Visit: Payer: Self-pay | Admitting: *Deleted

## 2022-01-19 ENCOUNTER — Ambulatory Visit: Payer: PPO | Admitting: Rheumatology

## 2022-01-19 DIAGNOSIS — G4733 Obstructive sleep apnea (adult) (pediatric): Secondary | ICD-10-CM

## 2022-01-19 DIAGNOSIS — M19071 Primary osteoarthritis, right ankle and foot: Secondary | ICD-10-CM

## 2022-01-19 DIAGNOSIS — M170 Bilateral primary osteoarthritis of knee: Secondary | ICD-10-CM

## 2022-01-19 DIAGNOSIS — Z79899 Other long term (current) drug therapy: Secondary | ICD-10-CM

## 2022-01-19 DIAGNOSIS — A419 Sepsis, unspecified organism: Secondary | ICD-10-CM

## 2022-01-19 DIAGNOSIS — Z8639 Personal history of other endocrine, nutritional and metabolic disease: Secondary | ICD-10-CM

## 2022-01-19 DIAGNOSIS — M19042 Primary osteoarthritis, left hand: Secondary | ICD-10-CM

## 2022-01-19 DIAGNOSIS — J189 Pneumonia, unspecified organism: Secondary | ICD-10-CM

## 2022-01-19 DIAGNOSIS — E2740 Unspecified adrenocortical insufficiency: Secondary | ICD-10-CM

## 2022-01-19 DIAGNOSIS — M1990 Unspecified osteoarthritis, unspecified site: Secondary | ICD-10-CM

## 2022-01-19 DIAGNOSIS — C7A8 Other malignant neuroendocrine tumors: Secondary | ICD-10-CM

## 2022-01-19 DIAGNOSIS — C4371 Malignant melanoma of right lower limb, including hip: Secondary | ICD-10-CM

## 2022-01-19 DIAGNOSIS — I2699 Other pulmonary embolism without acute cor pulmonale: Secondary | ICD-10-CM

## 2022-01-19 DIAGNOSIS — I471 Supraventricular tachycardia: Secondary | ICD-10-CM

## 2022-01-19 DIAGNOSIS — C779 Secondary and unspecified malignant neoplasm of lymph node, unspecified: Secondary | ICD-10-CM

## 2022-01-20 ENCOUNTER — Inpatient Hospital Stay: Payer: PPO

## 2022-01-20 ENCOUNTER — Other Ambulatory Visit: Payer: Self-pay

## 2022-01-20 ENCOUNTER — Inpatient Hospital Stay (HOSPITAL_BASED_OUTPATIENT_CLINIC_OR_DEPARTMENT_OTHER): Payer: PPO | Admitting: Hematology & Oncology

## 2022-01-20 ENCOUNTER — Encounter: Payer: Self-pay | Admitting: Hematology & Oncology

## 2022-01-20 VITALS — BP 114/70 | HR 94 | Temp 97.9°F | Resp 18 | Ht 72.0 in | Wt 259.0 lb

## 2022-01-20 DIAGNOSIS — Z79899 Other long term (current) drug therapy: Secondary | ICD-10-CM | POA: Insufficient documentation

## 2022-01-20 DIAGNOSIS — C7A8 Other malignant neuroendocrine tumors: Secondary | ICD-10-CM | POA: Diagnosis not present

## 2022-01-20 DIAGNOSIS — D509 Iron deficiency anemia, unspecified: Secondary | ICD-10-CM | POA: Insufficient documentation

## 2022-01-20 DIAGNOSIS — C4371 Malignant melanoma of right lower limb, including hip: Secondary | ICD-10-CM

## 2022-01-20 DIAGNOSIS — Z923 Personal history of irradiation: Secondary | ICD-10-CM | POA: Insufficient documentation

## 2022-01-20 DIAGNOSIS — Z5111 Encounter for antineoplastic chemotherapy: Secondary | ICD-10-CM | POA: Diagnosis present

## 2022-01-20 DIAGNOSIS — M069 Rheumatoid arthritis, unspecified: Secondary | ICD-10-CM | POA: Insufficient documentation

## 2022-01-20 DIAGNOSIS — C774 Secondary and unspecified malignant neoplasm of inguinal and lower limb lymph nodes: Secondary | ICD-10-CM | POA: Insufficient documentation

## 2022-01-20 DIAGNOSIS — Z510 Encounter for antineoplastic radiation therapy: Secondary | ICD-10-CM | POA: Insufficient documentation

## 2022-01-20 DIAGNOSIS — C779 Secondary and unspecified malignant neoplasm of lymph node, unspecified: Secondary | ICD-10-CM | POA: Insufficient documentation

## 2022-01-20 LAB — COMPREHENSIVE METABOLIC PANEL
ALT: 19 U/L (ref 0–44)
AST: 15 U/L (ref 15–41)
Albumin: 4.4 g/dL (ref 3.5–5.0)
Alkaline Phosphatase: 64 U/L (ref 38–126)
Anion gap: 10 (ref 5–15)
BUN: 15 mg/dL (ref 6–20)
CO2: 29 mmol/L (ref 22–32)
Calcium: 9.8 mg/dL (ref 8.9–10.3)
Chloride: 102 mmol/L (ref 98–111)
Creatinine, Ser: 0.99 mg/dL (ref 0.61–1.24)
GFR, Estimated: >60 mL/min (ref >60)
Glucose, Bld: 120 mg/dL — ABNORMAL HIGH (ref 70–99)
Potassium: 3.6 mmol/L (ref 3.5–5.1)
Sodium: 141 mmol/L (ref 135–145)
Total Bilirubin: 0.5 mg/dL (ref 0.3–1.2)
Total Protein: 7 g/dL (ref 6.5–8.1)

## 2022-01-20 LAB — CBC WITH DIFFERENTIAL (CANCER CENTER ONLY)
Abs Immature Granulocytes: 0.02 10*3/uL (ref 0.00–0.07)
Basophils Absolute: 0 10*3/uL (ref 0.0–0.1)
Basophils Relative: 0 %
Eosinophils Absolute: 0.4 10*3/uL (ref 0.0–0.5)
Eosinophils Relative: 8 %
HCT: 37.7 % — ABNORMAL LOW (ref 39.0–52.0)
Hemoglobin: 12.3 g/dL — ABNORMAL LOW (ref 13.0–17.0)
Immature Granulocytes: 0 %
Lymphocytes Relative: 8 %
Lymphs Abs: 0.4 10*3/uL — ABNORMAL LOW (ref 0.7–4.0)
MCH: 30.2 pg (ref 26.0–34.0)
MCHC: 32.6 g/dL (ref 30.0–36.0)
MCV: 92.6 fL (ref 80.0–100.0)
Monocytes Absolute: 0.3 10*3/uL (ref 0.1–1.0)
Monocytes Relative: 6 %
Neutro Abs: 3.6 10*3/uL (ref 1.7–7.7)
Neutrophils Relative %: 78 %
Platelet Count: 145 10*3/uL — ABNORMAL LOW (ref 150–400)
RBC: 4.07 MIL/uL — ABNORMAL LOW (ref 4.22–5.81)
RDW: 14.1 % (ref 11.5–15.5)
WBC Count: 4.6 10*3/uL (ref 4.0–10.5)
nRBC: 0 % (ref 0.0–0.2)

## 2022-01-20 LAB — FERRITIN: Ferritin: 1418 ng/mL — ABNORMAL HIGH (ref 24–336)

## 2022-01-20 LAB — IRON AND IRON BINDING CAPACITY (CC-WL,HP ONLY)
Iron: 67 ug/dL (ref 45–182)
Saturation Ratios: 31 % (ref 17.9–39.5)
TIBC: 214 ug/dL — ABNORMAL LOW (ref 250–450)
UIBC: 147 ug/dL (ref 117–376)

## 2022-01-20 LAB — LACTATE DEHYDROGENASE: LDH: 161 U/L (ref 98–192)

## 2022-01-20 NOTE — Progress Notes (Signed)
DISCONTINUE ON PATHWAY REGIMEN - Melanoma and Other Skin Cancers     A cycle is every 28 days:     Nivolumab   **Always confirm dose/schedule in your pharmacy ordering system**  REASON: Disease Progression PRIOR TREATMENT: [id number]: Nivolumab 480 mg q28 Days for up to 1 Year of Therapy TREATMENT RESPONSE: Partial Response (PR)  START ON PATHWAY REGIMEN - Melanoma and Other Skin Cancers     A cycle is every 21 days:     Dacarbazine   **Always confirm dose/schedule in your pharmacy ordering system**  Patient Characteristics: Melanoma, Cutaneous/Unknown Primary, Distant Metastases, Unresectable, No Brain Metastases, Third Line and Beyond, BRAF V600 Wild Type / BRAF V600 Results Pending or Unknown, Not a Candidate for Immunotherapy Disease Classification: Melanoma Disease Subtype: Cutaneous BRAF V600 Mutation Status: BRAF V600 Wild Type (No Mutation) Therapeutic Status: Distant Metastases Line of Therapy: Third Line and Beyond Immunotherapy Candidate Status: Not a Candidate for Immunotherapy Intent of Therapy: Non-Curative / Palliative Intent, Discussed with Patient

## 2022-01-20 NOTE — Progress Notes (Signed)
Hematology and Oncology Follow Up Visit  James Byrd [id number] 1966-05-25 56 y.o. 01/20/2022   Principle Diagnosis:  Stage IIIB ([id number]) subungual melanoma of the right hallux -- nodal recurrence in the RIGHT inguinal nodes -- BRAF wt RIGHT Common Iliac node recurrence Progression of melanoma-lymph node metastasis- 02/2021 Neuroendocrine carcinoma of the pancreas Iron def anemia   Past Therapy:        Yervoy-status post 3 cycles - discontinued in September 2016 secondary to toxicity Somatuline 120 mg IM q month -- started on 05/22/2019 - d/c on 08/2019 XRT to the RIGHT inguinal basin XRT to abdominal lymph nodes-completed 09/21/2021  Current Therapy:   Nivolumab 480 mg q month -- started on 03/19/2021, s/p cycle #4 IV Iron as indicated  Status post surgical resection of abdominal disease - 11/2021 SBRT-lung nodules -start on 01/25/2022 Dacarbazine - IV -start cycle 1 on 01/26/2022    Interim History:  James Byrd is here today for follow-up.  He has been quite busy.  He underwent abdominal surgery at [facility].  James Byrd went ahead and did an exploratory surgery on him.  This was in June.  He resected what he could.  I will have to try to get the pathology report.  He is to start radiation therapy for lung.  He actually looks quite good.  He does have a little bit of slow healing with the laparotomy scar.  This is being dressed and packed by his wife.  She is doing a Chief Technology Officer job.  He has a decent appetite.  He has had no nausea or vomiting.  He has had no bowel or bladder changes.  He has had no problems with pain.  He is able to urinate without pain now.  Is been no problems with leg swelling.  Has had no rashes.  Is been no swollen lymph nodes.  He has had no headache.  Overall, I was his performance status is probably ECOG 0.      Medications:  Allergies as of 01/20/2022   No Known Allergies      Medication List        Accurate as of January 20, 2022  1:33 PM.  If you have any questions, ask your nurse or doctor.          STOP taking these medications    fentaNYL 25 MCG/HR Commonly known as: DURAGESIC Stopped by: [name], MD   Melatonin 10 MG Tabs Stopped by: [name], MD   sildenafil 50 MG tablet Commonly known as: Viagra Stopped by: [name], MD   solifenacin 10 MG tablet Commonly known as: VESIcare Stopped by: [name], MD       TAKE these medications    acetaminophen 650 MG CR tablet Commonly known as: TYLENOL Take 1,300 mg by mouth daily as needed for pain.   augmented betamethasone dipropionate 0.05 % cream Commonly known as: DIPROLENE-AF Apply 1 application topically 2 (two) times daily as needed (psoriasis).   BLACK CURRANT SEED OIL PO Take 2 tablets by mouth daily.   cyanocobalamin 2000 MCG tablet Take 2,000 mcg by mouth daily. Vitamin b12   diclofenac sodium 1 % Gel Commonly known as: VOLTAREN Apply 2 g topically daily as needed (pain).   DULoxetine 20 MG capsule Commonly known as: CYMBALTA Take 20 mg by mouth daily.   FISH OIL PO Take by mouth daily.   folic acid 1 MG tablet Commonly known as: FOLVITE TAKE TWO TABLETS BY MOUTH  ONCE DAILY   hydrocortisone 5 MG tablet Commonly known as: CORTEF Take 5 mg by mouth 3 (three) times daily.   leflunomide 10 MG tablet Commonly known as: ARAVA TAKE TWO TABLETS BY MOUTH ONCE DAILY   multivitamin with minerals Tabs tablet Take 1 tablet by mouth daily.   naloxone 4 MG/0.1ML Liqd nasal spray kit Commonly known as: NARCAN   ondansetron 8 MG disintegrating tablet Commonly known as: ZOFRAN-ODT Take 1 tablet (8 mg total) by mouth every 8 (eight) hours as needed for nausea or vomiting.   OVER THE COUNTER MEDICATION Take 0.5 each by mouth as needed. CBD gummies   oxyCODONE-acetaminophen 10-325 MG tablet Commonly known as: PERCOCET Take 1 tablet by mouth every 4 (four) hours as needed for pain.   predniSONE 10 MG  tablet Commonly known as: DELTASONE Take 1 tablet (10 mg total) by mouth daily with breakfast.   pregabalin 75 MG capsule Commonly known as: LYRICA TAKE ONE CAPSULE BY MOUTH TWICE DAILY   pyridOXINE 100 MG tablet Commonly known as: VITAMIN B6 Take 100 mg by mouth daily.   Testosterone 20.25 MG/ACT (1.62%) Gel   vitamin C 1000 MG tablet Take 1,000 mg by mouth daily.   Vitamin D 50 MCG (2000 UT) tablet Take 2,000 Units by mouth daily.   zinc gluconate 50 MG tablet Take 50 mg by mouth daily.        Allergies: No Known Allergies  Past Medical History, Surgical history, Social history, and Family History were reviewed and updated.  Review of Systems: Review of Systems  Constitutional: Negative.   HENT: Negative.    Eyes: Negative.   Respiratory: Negative.    Cardiovascular: Negative.   Gastrointestinal: Negative.   Genitourinary: Negative.   Musculoskeletal:  Positive for joint pain and myalgias.  Skin: Negative.   Neurological: Negative.   Endo/Heme/Allergies: Negative.   Psychiatric/Behavioral: Negative.      Physical Exam:  height is 6' (1.829 m) and weight is 259 lb (117.5 kg). His oral temperature is 97.9 F (36.6 C). His blood pressure is 114/70 and his pulse is 94. His respiration is 18 and oxygen saturation is 98%.   Wt Readings from Last 3 Encounters:  01/20/22 259 lb (117.5 kg)  01/10/22 261 lb (118.4 kg)  11/03/21 260 lb (117.9 kg)    Physical Exam Vitals reviewed.  HENT:     Head: Normocephalic and atraumatic.  Eyes:     Pupils: Pupils are equal, round, and reactive to light.  Cardiovascular:     Rate and Rhythm: Normal rate and regular rhythm.     Heart sounds: Normal heart sounds.  Pulmonary:     Effort: Pulmonary effort is normal.     Breath sounds: Normal breath sounds.  Abdominal:     General: Bowel sounds are normal.     Palpations: Abdomen is soft.  Musculoskeletal:        General: No tenderness or deformity. Normal range of  motion.     Cervical back: Normal range of motion.  Lymphadenopathy:     Cervical: No cervical adenopathy.  Skin:    General: Skin is warm and dry.     Findings: No erythema or rash.  Neurological:     Mental Status: He is alert and oriented to person, place, and time.  Psychiatric:        Behavior: Behavior normal.        Thought Content: Thought content normal.        Judgment: Judgment normal.  Lab Results  Component Value Date   WBC 4.6 01/20/2022   HGB 12.3 (L) 01/20/2022   HCT 37.7 (L) 01/20/2022   MCV 92.6 01/20/2022   PLT 145 (L) 01/20/2022   Lab Results  Component Value Date   FERRITIN 1,601 (H) 08/27/2021   IRON 39 (L) 08/27/2021   TIBC 228 (L) 08/27/2021   UIBC 189 08/27/2021   IRONPCTSAT 17 (L) 08/27/2021   Lab Results  Component Value Date   RETICCTPCT 1.6 11/27/2019   RBC 4.07 (L) 01/20/2022   No results found for: "KPAFRELGTCHN", "LAMBDASER", "KAPLAMBRATIO" Lab Results  Component Value Date   IGGSERUM 1,046 03/25/2020   IGMSERUM 64 03/25/2020   Lab Results  Component Value Date   ALBUMINELP 4.3 03/25/2020   A1GS 0.3 03/25/2020   A2GS 0.7 03/25/2020   BETS 0.4 03/25/2020   BETA2SER 0.3 03/25/2020   GAMS 0.9 03/25/2020   SPEI  03/25/2020     Comment:     Normal Serum Protein Electrophoresis Pattern. No abnormal protein bands (M-protein) detected.      Chemistry      Component Value Date/Time   NA 141 01/20/2022 1139   NA 138 06/04/2020 1003   NA 145 06/06/2017 1316   NA 141 10/16/2015 1334   K 3.6 01/20/2022 1139   K 4.1 06/06/2017 1316   K 3.9 10/16/2015 1334   CL 102 01/20/2022 1139   CL 108 06/06/2017 1316   CO2 29 01/20/2022 1139   CO2 26 06/06/2017 1316   CO2 21 (L) 10/16/2015 1334   BUN 15 01/20/2022 1139   BUN 17 06/04/2020 1003   BUN 19 06/06/2017 1316   BUN 20.0 10/16/2015 1334   CREATININE 0.99 01/20/2022 1139   CREATININE 0.79 11/03/2021 1528   CREATININE 1.0 10/16/2015 1334      Component Value Date/Time    CALCIUM 9.8 01/20/2022 1139   CALCIUM 9.4 06/06/2017 1316   CALCIUM 9.7 10/16/2015 1334   ALKPHOS 64 01/20/2022 1139   ALKPHOS 78 06/06/2017 1316   ALKPHOS 62 10/16/2015 1334   AST 15 01/20/2022 1139   AST 15 10/04/2021 0818   AST 16 10/16/2015 1334   ALT 19 01/20/2022 1139   ALT 15 10/04/2021 0818   ALT 27 06/06/2017 1316   ALT 11 10/16/2015 1334   BILITOT 0.5 01/20/2022 1139   BILITOT 0.5 10/04/2021 0818   BILITOT 0.49 10/16/2015 1334       Impression and Plan: Mr. Hellen is a very nice  56 yo caucasian gentleman with history of stage IIIB melanoma of the right hallux that was subungual with one microscopic positive inguinal lymph node. He completed 3 cycles of Yervoy but stopped due to side effects.   He has had multiple recurrences.  He has had radiation for these.  He has had treatment for these.  He ultimately has had surgery to try to resect out what has recurred in his abdomen.  He is having a lot of pain and that was a motivation behind the surgery.  Again, he does not quite good.  I do think it be worthwhile to try him on some dacarbazine.  I think this would be a reasonable option for him.  He will start his radiation therapy to the lung.  I will try the dacarbazine for any Residual disease that he has.  I probably would give him 4 cycles and then we can repeat his PET scan.  The PET scan really is the best way for [place] to  identify him responding to treatment.  I just want his quality of life to be good.  Everything that we have been doing is focused on his quality of life.  We will start treatment next week.  I will plan to get him back to see me when he has his second cycle of treatment in late August.    [name], MD 8/3/20231:33 PM

## 2022-01-21 ENCOUNTER — Other Ambulatory Visit: Payer: Self-pay

## 2022-01-21 NOTE — Progress Notes (Signed)
Pharmacist Chemotherapy Monitoring - Initial Assessment    Anticipated start date: 01/26/22   The following has been reviewed per standard work regarding the patient's treatment regimen: The patient's diagnosis, treatment plan and drug doses, and organ/hematologic function Lab orders and baseline tests specific to treatment regimen  The treatment plan start date, drug sequencing, and pre-medications Prior authorization status  Patient's documented medication list, including drug-drug interaction screen and prescriptions for anti-emetics and supportive care specific to the treatment regimen The drug concentrations, fluid compatibility, administration routes, and timing of the medications to be used The patient's access for treatment and lifetime cumulative dose history, if applicable  The patient's medication allergies and previous infusion related reactions, if applicable   Changes made to treatment plan:  N/A  Follow up needed:  Pending authorization for treatment    Clella Mckeel, Jacqlyn Larsen, [facility], 01/21/2022  10:26 AM

## 2022-01-22 ENCOUNTER — Other Ambulatory Visit: Payer: Self-pay

## 2022-01-24 ENCOUNTER — Other Ambulatory Visit: Payer: Self-pay

## 2022-01-24 DIAGNOSIS — Z5111 Encounter for antineoplastic chemotherapy: Secondary | ICD-10-CM | POA: Diagnosis not present

## 2022-01-24 DIAGNOSIS — C4371 Malignant melanoma of right lower limb, including hip: Secondary | ICD-10-CM | POA: Diagnosis not present

## 2022-01-24 DIAGNOSIS — C779 Secondary and unspecified malignant neoplasm of lymph node, unspecified: Secondary | ICD-10-CM | POA: Insufficient documentation

## 2022-01-25 ENCOUNTER — Other Ambulatory Visit: Payer: Self-pay

## 2022-01-25 ENCOUNTER — Ambulatory Visit
Admission: RE | Admit: 2022-01-25 | Discharge: 2022-01-25 | Disposition: A | Discharge status: Home / Self Care | Payer: PPO | Source: Ambulatory Visit | Attending: Radiation Oncology | Admitting: Radiation Oncology

## 2022-01-25 DIAGNOSIS — C4371 Malignant melanoma of right lower limb, including hip: Secondary | ICD-10-CM | POA: Diagnosis not present

## 2022-01-25 DIAGNOSIS — Z5111 Encounter for antineoplastic chemotherapy: Secondary | ICD-10-CM | POA: Diagnosis not present

## 2022-01-25 LAB — RAD ONC ARIA SESSION SUMMARY
Course Elapsed Days: 0
Plan Fractions Treated to Date: 1
Plan Fractions Treated to Date: 1
Plan Prescribed Dose Per Fraction: 5 Gy
Plan Prescribed Dose Per Fraction: 5 Gy
Plan Total Fractions Prescribed: 10
Plan Total Fractions Prescribed: 10
Plan Total Prescribed Dose: 50 Gy
Plan Total Prescribed Dose: 50 Gy
Reference Point Dosage Given to Date: 5 Gy
Reference Point Dosage Given to Date: 5 Gy
Reference Point Session Dosage Given: 5 Gy
Reference Point Session Dosage Given: 5 Gy
Session Number: 1

## 2022-01-26 ENCOUNTER — Other Ambulatory Visit: Payer: Self-pay

## 2022-01-26 ENCOUNTER — Ambulatory Visit
Admission: RE | Admit: 2022-01-26 | Discharge: 2022-01-26 | Disposition: A | Discharge status: Home / Self Care | Payer: PPO | Source: Ambulatory Visit | Attending: Radiation Oncology | Admitting: Radiation Oncology

## 2022-01-26 ENCOUNTER — Inpatient Hospital Stay: Payer: PPO | Admitting: Licensed Clinical Social Worker

## 2022-01-26 ENCOUNTER — Inpatient Hospital Stay: Payer: PPO

## 2022-01-26 ENCOUNTER — Encounter: Payer: Self-pay | Admitting: Hematology & Oncology

## 2022-01-26 VITALS — BP 129/80 | HR 89 | Temp 97.7°F | Resp 18

## 2022-01-26 DIAGNOSIS — C4371 Malignant melanoma of right lower limb, including hip: Secondary | ICD-10-CM

## 2022-01-26 DIAGNOSIS — C779 Secondary and unspecified malignant neoplasm of lymph node, unspecified: Secondary | ICD-10-CM

## 2022-01-26 DIAGNOSIS — Z5111 Encounter for antineoplastic chemotherapy: Secondary | ICD-10-CM | POA: Diagnosis not present

## 2022-01-26 LAB — RAD ONC ARIA SESSION SUMMARY
Course Elapsed Days: 1
Plan Fractions Treated to Date: 2
Plan Fractions Treated to Date: 2
Plan Prescribed Dose Per Fraction: 5 Gy
Plan Prescribed Dose Per Fraction: 5 Gy
Plan Total Fractions Prescribed: 10
Plan Total Fractions Prescribed: 10
Plan Total Prescribed Dose: 50 Gy
Plan Total Prescribed Dose: 50 Gy
Reference Point Dosage Given to Date: 10 Gy
Reference Point Dosage Given to Date: 10 Gy
Reference Point Session Dosage Given: 5 Gy
Reference Point Session Dosage Given: 5 Gy
Session Number: 2

## 2022-01-26 LAB — CBC WITH DIFFERENTIAL (CANCER CENTER ONLY)
Abs Immature Granulocytes: 0.02 10*3/uL (ref 0.00–0.07)
Basophils Absolute: 0 10*3/uL (ref 0.0–0.1)
Basophils Relative: 0 %
Eosinophils Absolute: 0.2 10*3/uL (ref 0.0–0.5)
Eosinophils Relative: 3 %
HCT: 39.1 % (ref 39.0–52.0)
Hemoglobin: 12.9 g/dL — ABNORMAL LOW (ref 13.0–17.0)
Immature Granulocytes: 0 %
Lymphocytes Relative: 6 %
Lymphs Abs: 0.3 10*3/uL — ABNORMAL LOW (ref 0.7–4.0)
MCH: 30.1 pg (ref 26.0–34.0)
MCHC: 33 g/dL (ref 30.0–36.0)
MCV: 91.1 fL (ref 80.0–100.0)
Monocytes Absolute: 0.3 10*3/uL (ref 0.1–1.0)
Monocytes Relative: 6 %
Neutro Abs: 4.5 10*3/uL (ref 1.7–7.7)
Neutrophils Relative %: 85 %
Platelet Count: 158 10*3/uL (ref 150–400)
RBC: 4.29 MIL/uL (ref 4.22–5.81)
RDW: 13.9 % (ref 11.5–15.5)
WBC Count: 5.3 10*3/uL (ref 4.0–10.5)
nRBC: 0 % (ref 0.0–0.2)

## 2022-01-26 LAB — CMP (CANCER CENTER ONLY)
ALT: 21 U/L (ref 0–44)
AST: 15 U/L (ref 15–41)
Albumin: 4.6 g/dL (ref 3.5–5.0)
Alkaline Phosphatase: 67 U/L (ref 38–126)
Anion gap: 10 (ref 5–15)
BUN: 18 mg/dL (ref 6–20)
CO2: 26 mmol/L (ref 22–32)
Calcium: 9.7 mg/dL (ref 8.9–10.3)
Chloride: 105 mmol/L (ref 98–111)
Creatinine: 0.94 mg/dL (ref 0.61–1.24)
GFR, Estimated: >60 mL/min (ref >60)
Glucose, Bld: 131 mg/dL — ABNORMAL HIGH (ref 70–99)
Potassium: 3.7 mmol/L (ref 3.5–5.1)
Sodium: 141 mmol/L (ref 135–145)
Total Bilirubin: 0.5 mg/dL (ref 0.3–1.2)
Total Protein: 6.8 g/dL (ref 6.5–8.1)

## 2022-01-26 LAB — LACTATE DEHYDROGENASE: LDH: 173 U/L (ref 98–192)

## 2022-01-26 MED ORDER — DEXAMETHASONE 4 MG PO TABS: ORAL_TABLET | ORAL | 1 refills | Status: DC

## 2022-01-26 MED ORDER — SODIUM CHLORIDE 0.9 % IV SOLN: Freq: Once | INTRAVENOUS | Status: AC

## 2022-01-26 MED ORDER — SODIUM CHLORIDE 0.9 % IV SOLN
985.0000 mg/m2 | Freq: Once | INTRAVENOUS | Status: AC
  Administered 2022-01-26: 2400 mg via INTRAVENOUS
  Filled 2022-01-26: qty 240

## 2022-01-26 MED ORDER — PROCHLORPERAZINE MALEATE 10 MG PO TABS: 10.0000 mg | ORAL_TABLET | Freq: Four times a day (QID) | ORAL | 1 refills | Status: DC | PRN

## 2022-01-26 MED ORDER — SODIUM CHLORIDE 0.9 % IV SOLN
150.0000 mg | Freq: Once | INTRAVENOUS | Status: AC
  Administered 2022-01-26: 150 mg via INTRAVENOUS
  Filled 2022-01-26: qty 150

## 2022-01-26 MED ORDER — SODIUM CHLORIDE 0.9 % IV SOLN
10.0000 mg | Freq: Once | INTRAVENOUS | Status: AC
  Administered 2022-01-26: 10 mg via INTRAVENOUS
  Filled 2022-01-26: qty 10

## 2022-01-26 MED ORDER — PALONOSETRON HCL INJECTION 0.25 MG/5ML
0.2500 mg | Freq: Once | INTRAVENOUS | Status: AC
  Administered 2022-01-26: 0.25 mg via INTRAVENOUS

## 2022-01-26 NOTE — Progress Notes (Signed)
[facility] Work  Initial Assessment   Caesar Mannella is a 56 y.o. year old male accompanied by spouse. Clinical Social Work was referred by nurse navigator for assessment of psychosocial needs.   SDOH (Social Determinants of Health) assessments performed: Yes SDOH Interventions    Flowsheet Row Most Recent Value  SDOH Interventions   Food Insecurity Interventions Intervention Not Indicated  Housing Interventions Intervention Not Indicated  Transportation Interventions Intervention Not Indicated       SDOH Screenings   Alcohol Screen: Not on file  Depression (CBU3-8): Not on file  Financial Resource Strain: Medium Risk (01/26/2022)   Overall Financial Resource Strain (CARDIA)    Difficulty of Paying Living Expenses: Somewhat hard  Food Insecurity: No Food Insecurity (01/26/2022)   Hunger Vital Sign    Worried About Running Out of Food in the Last Year: Never true    Ran Out of Food in the Last Year: Never true  Housing: Low Risk  (01/26/2022)   Housing    Last Housing Risk Score: 0  Physical Activity: Not on file  Social Connections: Not on file  Stress: Not on file  Tobacco Use: Low Risk  (01/20/2022)   Patient History    Smoking Tobacco Use: Never    Smokeless Tobacco Use: Never    Passive Exposure: Never  Transportation Needs: No Transportation Needs (01/26/2022)   PRAPARE - Transportation    Lack of Transportation (Medical): No    Lack of Transportation (Non-Medical): No     Distress Screen completed: No    09/07/2021   10:56 AM  ONCBCN DISTRESS SCREENING  Screening Type Initial Screening  Distress experienced in past week (1-10) 2  Physical Problem type Pain;Sleep/insomnia;Changes in urination;Swollen arms/legs      Family/Social Information:  Housing Arrangement: patient lives with his wife, Hoyle Sauer . Family members/support persons in your life? Family Transportation concerns: no  Employment: Disabled  Income source: Ship broker concerns: Yes, due to illness and/or loss of work during treatment Type of concern: Medical bills Food access concerns: no Religious or spiritual practice: Yes.  Patient expressed deep faith. Services Currently in place:  [employer].  [facility] provides CNA services  Coping/ Adjustment to diagnosis: Patient understands treatment plan and what happens next? yes Concerns about diagnosis and/or treatment: How I will pay for the services I need Patient reported stressors: Insurance Hopes and/or priorities: His faith is his priority. Patient enjoys time with family/ friends Current coping skills/ strengths: Capable of independent living , Armed forces logistics/support/administrative officer , General fund of knowledge , Motivation for treatment/growth , and Supportive family/friends     SUMMARY: Current SDOH Barriers:  Financial constraints related to medical bills.  Clinical Social Work Clinical Goal(s):  Explore community resource options for unmet needs related to:  Financial Strain   Interventions: Discussed common feeling and emotions when being diagnosed with cancer, and the importance of support during treatment Informed patient of the support team roles and support services at [facility] Provided CSW contact information and encouraged patient to call with any questions or concerns Provided patient with information about how to apply for Medicaid.  Patient's wife expressed concern about paying for his medical bills.   Also provided [employer], [employer] and contact information.   Follow Up Plan: Patient will contact CSW with any support or resource needs Patient verbalizes understanding of plan: Yes    Rodman Pickle Torryn Hudspeth, LCSW

## 2022-01-26 NOTE — Patient Instructions (Addendum)
[facility]  Discharge Instructions: Thank you for choosing [facility] to provide your oncology and hematology care.   If you have a lab appointment with the [facility], please go directly to the [facility] and check in at the registration area.  Wear comfortable clothing and clothing appropriate for easy access to any Portacath or PICC line.   We strive to give you quality time with your provider. You may need to reschedule your appointment if you arrive late (15 or more minutes).  Arriving late affects you and other patients whose appointments are after yours.  Also, if you miss three or more appointments without notifying the office, you may be dismissed from the clinic at the provider's discretion.      For prescription refill requests, have your pharmacy contact our office and allow 72 hours for refills to be completed.    Today you received the following chemotherapy and/or immunotherapy agents Dacarbazine      To help prevent nausea and vomiting after your treatment, we encourage you to take your nausea medication as directed.   1) Dexamethasone- Take 2 tablets by mouth every morning with food for 3 days after each chemo treatment.    2)Prochlorperazine (Compazine) take 1 tablet by mouth every 6 hours as needed for nausea until 01/28/22  3) Beginning 01/28/22 you can take Zofran as NEEDED for nausea.   BELOW ARE SYMPTOMS THAT SHOULD BE REPORTED IMMEDIATELY: *FEVER GREATER THAN 100.4 F (38 C) OR HIGHER *CHILLS OR SWEATING *NAUSEA AND VOMITING THAT IS NOT CONTROLLED WITH YOUR NAUSEA MEDICATION *UNUSUAL SHORTNESS OF BREATH *UNUSUAL BRUISING OR BLEEDING *URINARY PROBLEMS (pain or burning when urinating, or frequent urination) *BOWEL PROBLEMS (unusual diarrhea, constipation, pain near the anus) TENDERNESS IN MOUTH AND THROAT WITH OR WITHOUT PRESENCE OF ULCERS (sore throat, sores in mouth, or a toothache) UNUSUAL RASH, SWELLING OR PAIN   UNUSUAL VAGINAL DISCHARGE OR ITCHING   Items with * indicate a potential emergency and should be followed up as soon as possible or go to the Emergency Department if any problems should occur.  Please show the CHEMOTHERAPY ALERT CARD or IMMUNOTHERAPY ALERT CARD at check-in to the Emergency Department and triage nurse. Should you have questions after your visit or need to cancel or reschedule your appointment, please contact [facility]  [phone] and follow the prompts.  Office hours are 8:00 a.m. to 4:30 p.m. Monday - Friday. Please note that voicemails left after 4:00 p.m. may not be returned until the following business day.  We are closed weekends and major holidays. You have access to a nurse at all times for urgent questions. Please call the main number to the clinic [phone] and follow the prompts.  For any non-urgent questions, you may also contact your provider using MyChart. We now offer e-Visits for anyone 92 and older to request care online for non-urgent symptoms. For details visit [email or web address].   Also download the MyChart app! Go to the app store, search "MyChart", open the app, select [facility], and log in with your MyChart username and password.  Masks are optional in the cancer centers. If you would like for your care team to wear a mask while they are taking care of you, please let them know. You may have one support person who is at least 56 years old accompany you for your appointments.

## 2022-01-27 ENCOUNTER — Other Ambulatory Visit: Payer: Self-pay

## 2022-01-27 ENCOUNTER — Ambulatory Visit
Admission: RE | Admit: 2022-01-27 | Discharge: 2022-01-27 | Disposition: A | Discharge status: Home / Self Care | Payer: PPO | Source: Ambulatory Visit | Attending: Radiation Oncology | Admitting: Radiation Oncology

## 2022-01-27 ENCOUNTER — Telehealth: Payer: Self-pay | Admitting: *Deleted

## 2022-01-27 DIAGNOSIS — C4371 Malignant melanoma of right lower limb, including hip: Secondary | ICD-10-CM | POA: Diagnosis not present

## 2022-01-27 DIAGNOSIS — Z5111 Encounter for antineoplastic chemotherapy: Secondary | ICD-10-CM | POA: Diagnosis not present

## 2022-01-27 DIAGNOSIS — C779 Secondary and unspecified malignant neoplasm of lymph node, unspecified: Secondary | ICD-10-CM

## 2022-01-27 LAB — RAD ONC ARIA SESSION SUMMARY
Course Elapsed Days: 2
Plan Fractions Treated to Date: 3
Plan Fractions Treated to Date: 3
Plan Prescribed Dose Per Fraction: 5 Gy
Plan Prescribed Dose Per Fraction: 5 Gy
Plan Total Fractions Prescribed: 10
Plan Total Fractions Prescribed: 10
Plan Total Prescribed Dose: 50 Gy
Plan Total Prescribed Dose: 50 Gy
Reference Point Dosage Given to Date: 15 Gy
Reference Point Dosage Given to Date: 15 Gy
Reference Point Session Dosage Given: 5 Gy
Reference Point Session Dosage Given: 5 Gy
Session Number: 3

## 2022-01-27 NOTE — Telephone Encounter (Signed)
Message received from patient's wife, Hoyle Sauer wanting to know if patient should continue Prednisone or take Decadron tablets that were sent in yesterday.  Dr. Marin Olp notified. Call placed back to [facility] notified per order of Dr. Marin Olp for pt to continue taking Prednisone 10 mg daily and to discontinue Decadron tablets.  Teach back done.  Hoyle Sauer is appreciative of call back and has no further questions at this time.

## 2022-01-28 ENCOUNTER — Other Ambulatory Visit: Payer: Self-pay

## 2022-01-28 ENCOUNTER — Ambulatory Visit
Admission: RE | Admit: 2022-01-28 | Discharge: 2022-01-28 | Disposition: A | Discharge status: Home / Self Care | Payer: PPO | Source: Ambulatory Visit | Attending: Radiation Oncology | Admitting: Radiation Oncology

## 2022-01-28 DIAGNOSIS — C4371 Malignant melanoma of right lower limb, including hip: Secondary | ICD-10-CM | POA: Diagnosis not present

## 2022-01-28 DIAGNOSIS — Z5111 Encounter for antineoplastic chemotherapy: Secondary | ICD-10-CM | POA: Diagnosis not present

## 2022-01-28 LAB — RAD ONC ARIA SESSION SUMMARY
Course Elapsed Days: 3
Plan Fractions Treated to Date: 4
Plan Fractions Treated to Date: 4
Plan Prescribed Dose Per Fraction: 5 Gy
Plan Prescribed Dose Per Fraction: 5 Gy
Plan Total Fractions Prescribed: 10
Plan Total Fractions Prescribed: 10
Plan Total Prescribed Dose: 50 Gy
Plan Total Prescribed Dose: 50 Gy
Reference Point Dosage Given to Date: 20 Gy
Reference Point Dosage Given to Date: 20 Gy
Reference Point Session Dosage Given: 5 Gy
Reference Point Session Dosage Given: 5 Gy
Session Number: 4

## 2022-01-31 ENCOUNTER — Other Ambulatory Visit: Payer: Self-pay

## 2022-01-31 ENCOUNTER — Ambulatory Visit
Admission: RE | Admit: 2022-01-31 | Discharge: 2022-01-31 | Disposition: A | Discharge status: Home / Self Care | Payer: PPO | Source: Ambulatory Visit | Attending: Radiation Oncology | Admitting: Radiation Oncology

## 2022-01-31 DIAGNOSIS — Z5111 Encounter for antineoplastic chemotherapy: Secondary | ICD-10-CM | POA: Diagnosis not present

## 2022-01-31 DIAGNOSIS — C4371 Malignant melanoma of right lower limb, including hip: Secondary | ICD-10-CM | POA: Diagnosis not present

## 2022-01-31 LAB — RAD ONC ARIA SESSION SUMMARY
Course Elapsed Days: 6
Plan Fractions Treated to Date: 5
Plan Fractions Treated to Date: 5
Plan Prescribed Dose Per Fraction: 5 Gy
Plan Prescribed Dose Per Fraction: 5 Gy
Plan Total Fractions Prescribed: 10
Plan Total Fractions Prescribed: 10
Plan Total Prescribed Dose: 50 Gy
Plan Total Prescribed Dose: 50 Gy
Reference Point Dosage Given to Date: 25 Gy
Reference Point Dosage Given to Date: 25 Gy
Reference Point Session Dosage Given: 5 Gy
Reference Point Session Dosage Given: 5 Gy
Session Number: 5

## 2022-02-01 ENCOUNTER — Other Ambulatory Visit: Payer: Self-pay

## 2022-02-01 ENCOUNTER — Ambulatory Visit: Payer: PPO

## 2022-02-01 ENCOUNTER — Ambulatory Visit
Admission: RE | Admit: 2022-02-01 | Discharge: 2022-02-01 | Disposition: A | Discharge status: Home / Self Care | Payer: PPO | Source: Ambulatory Visit | Attending: Radiation Oncology | Admitting: Radiation Oncology

## 2022-02-01 DIAGNOSIS — Z5111 Encounter for antineoplastic chemotherapy: Secondary | ICD-10-CM | POA: Diagnosis not present

## 2022-02-01 DIAGNOSIS — C4371 Malignant melanoma of right lower limb, including hip: Secondary | ICD-10-CM | POA: Diagnosis not present

## 2022-02-01 LAB — RAD ONC ARIA SESSION SUMMARY
Course Elapsed Days: 7
Plan Fractions Treated to Date: 6
Plan Fractions Treated to Date: 6
Plan Prescribed Dose Per Fraction: 5 Gy
Plan Prescribed Dose Per Fraction: 5 Gy
Plan Total Fractions Prescribed: 10
Plan Total Fractions Prescribed: 10
Plan Total Prescribed Dose: 50 Gy
Plan Total Prescribed Dose: 50 Gy
Reference Point Dosage Given to Date: 30 Gy
Reference Point Dosage Given to Date: 30 Gy
Reference Point Session Dosage Given: 5 Gy
Reference Point Session Dosage Given: 5 Gy
Session Number: 6

## 2022-02-02 ENCOUNTER — Other Ambulatory Visit: Payer: Self-pay

## 2022-02-02 ENCOUNTER — Ambulatory Visit
Admission: RE | Admit: 2022-02-02 | Discharge: 2022-02-02 | Disposition: A | Discharge status: Home / Self Care | Payer: PPO | Source: Ambulatory Visit | Attending: Radiation Oncology | Admitting: Radiation Oncology

## 2022-02-02 DIAGNOSIS — C4371 Malignant melanoma of right lower limb, including hip: Secondary | ICD-10-CM

## 2022-02-02 DIAGNOSIS — Z5111 Encounter for antineoplastic chemotherapy: Secondary | ICD-10-CM | POA: Diagnosis not present

## 2022-02-02 LAB — RAD ONC ARIA SESSION SUMMARY
Course Elapsed Days: 8
Plan Fractions Treated to Date: 7
Plan Fractions Treated to Date: 7
Plan Prescribed Dose Per Fraction: 5 Gy
Plan Prescribed Dose Per Fraction: 5 Gy
Plan Total Fractions Prescribed: 10
Plan Total Fractions Prescribed: 10
Plan Total Prescribed Dose: 50 Gy
Plan Total Prescribed Dose: 50 Gy
Reference Point Dosage Given to Date: 35 Gy
Reference Point Dosage Given to Date: 35 Gy
Reference Point Session Dosage Given: 5 Gy
Reference Point Session Dosage Given: 5 Gy
Session Number: 7

## 2022-02-03 ENCOUNTER — Other Ambulatory Visit: Payer: Self-pay

## 2022-02-03 ENCOUNTER — Ambulatory Visit
Admission: RE | Admit: 2022-02-03 | Discharge: 2022-02-03 | Disposition: A | Discharge status: Home / Self Care | Payer: PPO | Source: Ambulatory Visit | Attending: Radiation Oncology | Admitting: Radiation Oncology

## 2022-02-03 DIAGNOSIS — C4371 Malignant melanoma of right lower limb, including hip: Secondary | ICD-10-CM

## 2022-02-03 DIAGNOSIS — Z5111 Encounter for antineoplastic chemotherapy: Secondary | ICD-10-CM | POA: Diagnosis not present

## 2022-02-03 LAB — RAD ONC ARIA SESSION SUMMARY
Course Elapsed Days: 9
Plan Fractions Treated to Date: 8
Plan Fractions Treated to Date: 8
Plan Prescribed Dose Per Fraction: 5 Gy
Plan Prescribed Dose Per Fraction: 5 Gy
Plan Total Fractions Prescribed: 10
Plan Total Fractions Prescribed: 10
Plan Total Prescribed Dose: 50 Gy
Plan Total Prescribed Dose: 50 Gy
Reference Point Dosage Given to Date: 40 Gy
Reference Point Dosage Given to Date: 40 Gy
Reference Point Session Dosage Given: 5 Gy
Reference Point Session Dosage Given: 5 Gy
Session Number: 8

## 2022-02-04 ENCOUNTER — Other Ambulatory Visit: Payer: Self-pay | Admitting: Rheumatology

## 2022-02-04 ENCOUNTER — Ambulatory Visit
Admission: RE | Admit: 2022-02-04 | Discharge: 2022-02-04 | Disposition: A | Discharge status: Home / Self Care | Payer: PPO | Source: Ambulatory Visit | Attending: Radiation Oncology | Admitting: Radiation Oncology

## 2022-02-04 ENCOUNTER — Other Ambulatory Visit: Payer: Self-pay

## 2022-02-04 DIAGNOSIS — C4371 Malignant melanoma of right lower limb, including hip: Secondary | ICD-10-CM

## 2022-02-04 DIAGNOSIS — Z5111 Encounter for antineoplastic chemotherapy: Secondary | ICD-10-CM | POA: Diagnosis not present

## 2022-02-04 LAB — RAD ONC ARIA SESSION SUMMARY
Course Elapsed Days: 10
Plan Fractions Treated to Date: 9
Plan Fractions Treated to Date: 9
Plan Prescribed Dose Per Fraction: 5 Gy
Plan Prescribed Dose Per Fraction: 5 Gy
Plan Total Fractions Prescribed: 10
Plan Total Fractions Prescribed: 10
Plan Total Prescribed Dose: 50 Gy
Plan Total Prescribed Dose: 50 Gy
Reference Point Dosage Given to Date: 45 Gy
Reference Point Dosage Given to Date: 45 Gy
Reference Point Session Dosage Given: 5 Gy
Reference Point Session Dosage Given: 5 Gy
Session Number: 9

## 2022-02-07 ENCOUNTER — Encounter: Payer: Self-pay | Admitting: Radiation Oncology

## 2022-02-07 ENCOUNTER — Ambulatory Visit
Admission: RE | Admit: 2022-02-07 | Discharge: 2022-02-07 | Disposition: A | Discharge status: Home / Self Care | Payer: PPO | Source: Ambulatory Visit | Attending: Radiation Oncology | Admitting: Radiation Oncology

## 2022-02-07 ENCOUNTER — Ambulatory Visit: Payer: PPO

## 2022-02-07 ENCOUNTER — Other Ambulatory Visit: Payer: Self-pay

## 2022-02-07 DIAGNOSIS — C4371 Malignant melanoma of right lower limb, including hip: Secondary | ICD-10-CM

## 2022-02-07 DIAGNOSIS — Z5111 Encounter for antineoplastic chemotherapy: Secondary | ICD-10-CM | POA: Diagnosis not present

## 2022-02-07 LAB — RAD ONC ARIA SESSION SUMMARY
Course Elapsed Days: 13
Plan Fractions Treated to Date: 10
Plan Fractions Treated to Date: 10
Plan Prescribed Dose Per Fraction: 5 Gy
Plan Prescribed Dose Per Fraction: 5 Gy
Plan Total Fractions Prescribed: 10
Plan Total Fractions Prescribed: 10
Plan Total Prescribed Dose: 50 Gy
Plan Total Prescribed Dose: 50 Gy
Reference Point Dosage Given to Date: 50 Gy
Reference Point Dosage Given to Date: 50 Gy
Reference Point Session Dosage Given: 5 Gy
Reference Point Session Dosage Given: 5 Gy
Session Number: 10

## 2022-02-08 ENCOUNTER — Other Ambulatory Visit: Payer: Self-pay | Admitting: Hematology & Oncology

## 2022-02-08 ENCOUNTER — Other Ambulatory Visit: Payer: Self-pay | Admitting: Physician Assistant

## 2022-02-10 ENCOUNTER — Other Ambulatory Visit: Payer: Self-pay | Admitting: *Deleted

## 2022-02-10 MED ORDER — HYDROCORTISONE 5 MG PO TABS: 5.0000 mg | ORAL_TABLET | Freq: Three times a day (TID) | ORAL | 2 refills | Status: AC

## 2022-02-10 MED ORDER — LEFLUNOMIDE 10 MG PO TABS: 20.0000 mg | ORAL_TABLET | Freq: Every day | ORAL | 2 refills | Status: AC

## 2022-02-10 MED ORDER — OXYCODONE-ACETAMINOPHEN 10-325 MG PO TABS
1.0000 | ORAL_TABLET | ORAL | 0 refills | Status: DC | PRN
Start: 2022-02-10 — End: 2022-05-25

## 2022-02-10 MED ORDER — FOLIC ACID 1 MG PO TABS: 2.0000 mg | ORAL_TABLET | Freq: Every day | ORAL | 2 refills | Status: AC

## 2022-02-10 MED ORDER — PREGABALIN 75 MG PO CAPS: 75.0000 mg | ORAL_CAPSULE | Freq: Two times a day (BID) | ORAL | 3 refills | Status: AC

## 2022-02-15 ENCOUNTER — Other Ambulatory Visit: Payer: Self-pay

## 2022-02-15 DIAGNOSIS — C779 Secondary and unspecified malignant neoplasm of lymph node, unspecified: Secondary | ICD-10-CM

## 2022-02-16 ENCOUNTER — Inpatient Hospital Stay: Payer: PPO

## 2022-02-16 ENCOUNTER — Inpatient Hospital Stay (HOSPITAL_BASED_OUTPATIENT_CLINIC_OR_DEPARTMENT_OTHER): Payer: PPO | Admitting: Hematology & Oncology

## 2022-02-16 ENCOUNTER — Encounter: Payer: Self-pay | Admitting: Hematology & Oncology

## 2022-02-16 VITALS — BP 139/89 | HR 107 | Temp 98.2°F | Resp 18 | Wt 265.4 lb

## 2022-02-16 DIAGNOSIS — C4371 Malignant melanoma of right lower limb, including hip: Secondary | ICD-10-CM | POA: Diagnosis not present

## 2022-02-16 DIAGNOSIS — C779 Secondary and unspecified malignant neoplasm of lymph node, unspecified: Secondary | ICD-10-CM | POA: Diagnosis not present

## 2022-02-16 DIAGNOSIS — Z5111 Encounter for antineoplastic chemotherapy: Secondary | ICD-10-CM | POA: Diagnosis not present

## 2022-02-16 LAB — CBC WITH DIFFERENTIAL (CANCER CENTER ONLY)
Abs Immature Granulocytes: 0.01 10*3/uL (ref 0.00–0.07)
Basophils Absolute: 0 10*3/uL (ref 0.0–0.1)
Basophils Relative: 0 %
Eosinophils Absolute: 0.1 10*3/uL (ref 0.0–0.5)
Eosinophils Relative: 8 %
HCT: 37 % — ABNORMAL LOW (ref 39.0–52.0)
Hemoglobin: 12.5 g/dL — ABNORMAL LOW (ref 13.0–17.0)
Immature Granulocytes: 1 %
Lymphocytes Relative: 16 %
Lymphs Abs: 0.1 10*3/uL — ABNORMAL LOW (ref 0.7–4.0)
MCH: 31.2 pg (ref 26.0–34.0)
MCHC: 33.8 g/dL (ref 30.0–36.0)
MCV: 92.3 fL (ref 80.0–100.0)
Monocytes Absolute: 0.2 10*3/uL (ref 0.1–1.0)
Monocytes Relative: 24 %
Neutro Abs: 0.4 10*3/uL — CL (ref 1.7–7.7)
Neutrophils Relative %: 51 %
Platelet Count: 81 10*3/uL — ABNORMAL LOW (ref 150–400)
RBC: 4.01 MIL/uL — ABNORMAL LOW (ref 4.22–5.81)
RDW: 15.5 % (ref 11.5–15.5)
Smear Review: NORMAL
WBC Count: 0.9 10*3/uL — CL (ref 4.0–10.5)
nRBC: 0 % (ref 0.0–0.2)

## 2022-02-16 LAB — CMP (CANCER CENTER ONLY)
ALT: 31 U/L (ref 0–44)
AST: 19 U/L (ref 15–41)
Albumin: 4.3 g/dL (ref 3.5–5.0)
Alkaline Phosphatase: 64 U/L (ref 38–126)
Anion gap: 9 (ref 5–15)
BUN: 17 mg/dL (ref 6–20)
CO2: 25 mmol/L (ref 22–32)
Calcium: 9.7 mg/dL (ref 8.9–10.3)
Chloride: 106 mmol/L (ref 98–111)
Creatinine: 0.95 mg/dL (ref 0.61–1.24)
GFR, Estimated: >60 mL/min (ref >60)
Glucose, Bld: 108 mg/dL — ABNORMAL HIGH (ref 70–99)
Potassium: 3.5 mmol/L (ref 3.5–5.1)
Sodium: 140 mmol/L (ref 135–145)
Total Bilirubin: 0.4 mg/dL (ref 0.3–1.2)
Total Protein: 7 g/dL (ref 6.5–8.1)

## 2022-02-16 LAB — IRON AND IRON BINDING CAPACITY (CC-WL,HP ONLY)
Iron: 107 ug/dL (ref 45–182)
Saturation Ratios: 45 % — ABNORMAL HIGH (ref 17.9–39.5)
TIBC: 238 ug/dL — ABNORMAL LOW (ref 250–450)
UIBC: 131 ug/dL (ref 117–376)

## 2022-02-16 LAB — FERRITIN: Ferritin: 1365 ng/mL — ABNORMAL HIGH (ref 24–336)

## 2022-02-16 LAB — LACTATE DEHYDROGENASE: LDH: 193 U/L — ABNORMAL HIGH (ref 98–192)

## 2022-02-16 NOTE — Progress Notes (Signed)
Hematology and Oncology Follow Up Visit  James Byrd [id number] 12/06/1965 56 y.o. 02/16/2022   Principle Diagnosis:  Stage IIIB (T3bN1aM0) subungual melanoma of the right hallux -- nodal recurrence in the RIGHT inguinal nodes -- BRAF wt RIGHT Common Iliac node recurrence Progression of melanoma-lymph node metastasis- 02/2021 Neuroendocrine carcinoma of the pancreas Iron def anemia   Past Therapy:        Yervoy-status post 3 cycles - discontinued in September 2016 secondary to toxicity Somatuline 120 mg IM q month -- started on 05/22/2019 - d/c on 08/2019 XRT to the RIGHT inguinal basin XRT to abdominal lymph nodes-completed 09/21/2021  Current Therapy:   Nivolumab 480 mg q month -- started on 03/19/2021, s/p cycle #4 IV Iron as indicated  Status post surgical resection of abdominal disease - 11/2021 SBRT-lung nodules -start on 01/25/2022 Dacarbazine - IV -s/p cycle 1 -- start on 01/26/2022    Interim History:  James Byrd is here today for follow-up.  He actually looks quite good.  He really did well after having his surgery.  This was done at [facility].  He is very pleased with the results so far.  He also has had radiation therapy to his lung metastasis.  He did well with this.  It sounds like he has had a good response today.  He has had 1 cycle of the dacarbazine.  His white cell count is little bit low today to treat him.  As such, we will hold off for 1 week.  He has had no nausea or vomiting.  He has had no obvious change in bowel or bladder habits.  He has had no pain issues.  He has had no bleeding.  He has had no leg swelling.  He has had a couple bruises, which I think is probably from his lower platelet count.  He has had no headache.  Overall, I would say his performance status is probably ECOG 1.      Medications:  Allergies as of 02/16/2022   No Known Allergies      Medication List        Accurate as of February 16, 2022 11:22 AM. If you have any questions,  ask your nurse or doctor.          acetaminophen 650 MG CR tablet Commonly known as: TYLENOL Take 1,300 mg by mouth daily as needed for pain.   augmented betamethasone dipropionate 0.05 % cream Commonly known as: DIPROLENE-AF Apply 1 application topically 2 (two) times daily as needed (psoriasis).   BLACK CURRANT SEED OIL PO Take 2 tablets by mouth daily.   cyanocobalamin 2000 MCG tablet Take 2,000 mcg by mouth daily. Vitamin b12   dexamethasone 4 MG tablet Commonly known as: DECADRON Take 2 tablets by mouth once a day starting the day after chemotherapy. Continue for 3 days total. Take with food.   diclofenac sodium 1 % Gel Commonly known as: VOLTAREN Apply 2 g topically daily as needed (pain).   DULoxetine 20 MG capsule Commonly known as: CYMBALTA Take 20 mg by mouth daily.   FISH OIL PO Take by mouth daily.   folic acid 1 MG tablet Commonly known as: FOLVITE Take 2 tablets (2 mg total) by mouth daily.   hydrocortisone 5 MG tablet Commonly known as: CORTEF Take 1 tablet (5 mg total) by mouth 3 (three) times daily.   leflunomide 10 MG tablet Commonly known as: ARAVA Take 2 tablets (20 mg total) by mouth daily.   multivitamin with minerals  Tabs tablet Take 1 tablet by mouth daily.   naloxone 4 MG/0.1ML Liqd nasal spray kit Commonly known as: NARCAN   ondansetron 8 MG disintegrating tablet Commonly known as: ZOFRAN-ODT Take 1 tablet (8 mg total) by mouth every 8 (eight) hours as needed for nausea or vomiting.   OVER THE COUNTER MEDICATION Take 0.5 each by mouth as needed. CBD gummies   oxyCODONE-acetaminophen 10-325 MG tablet Commonly known as: PERCOCET Take 1 tablet by mouth every 4 (four) hours as needed for pain.   predniSONE 10 MG tablet Commonly known as: DELTASONE Take 1 tablet (10 mg total) by mouth daily with breakfast.   pregabalin 75 MG capsule Commonly known as: LYRICA Take 1 capsule (75 mg total) by mouth 2 (two) times daily.    prochlorperazine 10 MG tablet Commonly known as: COMPAZINE Take 1 tablet (10 mg total) by mouth every 6 (six) hours as needed (Nausea or vomiting).   pyridOXINE 100 MG tablet Commonly known as: VITAMIN B6 Take 100 mg by mouth daily.   Testosterone 20.25 MG/ACT (1.62%) Gel   vitamin C 1000 MG tablet Take 1,000 mg by mouth daily.   Vitamin D 50 MCG (2000 UT) tablet Take 2,000 Units by mouth daily.   zinc gluconate 50 MG tablet Take 50 mg by mouth daily.        Allergies: No Known Allergies  Past Medical History, Surgical history, Social history, and Family History were reviewed and updated.  Review of Systems: Review of Systems  Constitutional: Negative.   HENT: Negative.    Eyes: Negative.   Respiratory: Negative.    Cardiovascular: Negative.   Gastrointestinal: Negative.   Genitourinary: Negative.   Musculoskeletal:  Positive for joint pain and myalgias.  Skin: Negative.   Neurological: Negative.   Endo/Heme/Allergies: Negative.   Psychiatric/Behavioral: Negative.      Physical Exam:  weight is 265 lb 6.4 oz (120.4 kg). His oral temperature is 98.2 F (36.8 C). His blood pressure is 139/89 and his pulse is 107 (abnormal). His respiration is 18 and oxygen saturation is 95%.   Wt Readings from Last 3 Encounters:  02/16/22 265 lb 6.4 oz (120.4 kg)  01/20/22 259 lb (117.5 kg)  01/10/22 261 lb (118.4 kg)    Physical Exam Vitals reviewed.  HENT:     Head: Normocephalic and atraumatic.  Eyes:     Pupils: Pupils are equal, round, and reactive to light.  Cardiovascular:     Rate and Rhythm: Normal rate and regular rhythm.     Heart sounds: Normal heart sounds.  Pulmonary:     Effort: Pulmonary effort is normal.     Breath sounds: Normal breath sounds.  Abdominal:     General: Bowel sounds are normal.     Palpations: Abdomen is soft.  Musculoskeletal:        General: No tenderness or deformity. Normal range of motion.     Cervical back: Normal range of  motion.  Lymphadenopathy:     Cervical: No cervical adenopathy.  Skin:    General: Skin is warm and dry.     Findings: No erythema or rash.  Neurological:     Mental Status: He is alert and oriented to person, place, and time.  Psychiatric:        Behavior: Behavior normal.        Thought Content: Thought content normal.        Judgment: Judgment normal.     Lab Results  Component Value Date  WBC 0.9 (LL) 02/16/2022   HGB 12.5 (L) 02/16/2022   HCT 37.0 (L) 02/16/2022   MCV 92.3 02/16/2022   PLT 81 (L) 02/16/2022   Lab Results  Component Value Date   FERRITIN 1,418 (H) 01/20/2022   IRON 67 01/20/2022   TIBC 214 (L) 01/20/2022   UIBC 147 01/20/2022   IRONPCTSAT 31 01/20/2022   Lab Results  Component Value Date   RETICCTPCT 1.6 11/27/2019   RBC 4.01 (L) 02/16/2022   No results found for: "KPAFRELGTCHN", "LAMBDASER", "KAPLAMBRATIO" Lab Results  Component Value Date   IGGSERUM 1,046 03/25/2020   IGMSERUM 64 03/25/2020   Lab Results  Component Value Date   ALBUMINELP 4.3 03/25/2020   A1GS 0.3 03/25/2020   A2GS 0.7 03/25/2020   BETS 0.4 03/25/2020   BETA2SER 0.3 03/25/2020   GAMS 0.9 03/25/2020   SPEI  03/25/2020     Comment:     Normal Serum Protein Electrophoresis Pattern. No abnormal protein bands (M-protein) detected.      Chemistry      Component Value Date/Time   NA 140 02/16/2022 1018   NA 138 06/04/2020 1003   NA 145 06/06/2017 1316   NA 141 10/16/2015 1334   K 3.5 02/16/2022 1018   K 4.1 06/06/2017 1316   K 3.9 10/16/2015 1334   CL 106 02/16/2022 1018   CL 108 06/06/2017 1316   CO2 25 02/16/2022 1018   CO2 26 06/06/2017 1316   CO2 21 (L) 10/16/2015 1334   BUN 17 02/16/2022 1018   BUN 17 06/04/2020 1003   BUN 19 06/06/2017 1316   BUN 20.0 10/16/2015 1334   CREATININE 0.95 02/16/2022 1018   CREATININE 0.79 11/03/2021 1528   CREATININE 1.0 10/16/2015 1334      Component Value Date/Time   CALCIUM 9.7 02/16/2022 1018   CALCIUM 9.4  06/06/2017 1316   CALCIUM 9.7 10/16/2015 1334   ALKPHOS 64 02/16/2022 1018   ALKPHOS 78 06/06/2017 1316   ALKPHOS 62 10/16/2015 1334   AST 19 02/16/2022 1018   AST 16 10/16/2015 1334   ALT 31 02/16/2022 1018   ALT 27 06/06/2017 1316   ALT 11 10/16/2015 1334   BILITOT 0.4 02/16/2022 1018   BILITOT 0.49 10/16/2015 1334       Impression and Plan: Mr. James Byrd is a very nice  56 yo caucasian gentleman with history of stage IIIB melanoma of the right hallux that was subungual with one microscopic positive inguinal lymph node. He completed 3 cycles of Yervoy but stopped due to side effects.   He has had multiple recurrences.  He has had radiation for these.  He has had treatment for these.  He ultimately has had surgery to try to resect out what has recurred in his abdomen.  He is having a lot of pain and that was a motivation behind the surgery.  I suspect his blood count will come up within a week.  As such, we will treat with cycle #2 next week.  I am just happy that he has recovered so well from surgery.  He is quite active now.  The radiation really did not bother him all that much.  We will go ahead and plan to get him back next week for second cycle of treatment.   [name], MD 8/30/202311:22 AM

## 2022-02-22 ENCOUNTER — Other Ambulatory Visit: Payer: Self-pay | Admitting: Hematology & Oncology

## 2022-02-22 ENCOUNTER — Inpatient Hospital Stay: Payer: PPO

## 2022-02-22 ENCOUNTER — Inpatient Hospital Stay: Payer: PPO | Attending: Hematology & Oncology

## 2022-02-22 VITALS — BP 126/76 | HR 84 | Temp 98.6°F | Resp 20

## 2022-02-22 DIAGNOSIS — Z79899 Other long term (current) drug therapy: Secondary | ICD-10-CM | POA: Insufficient documentation

## 2022-02-22 DIAGNOSIS — Z5111 Encounter for antineoplastic chemotherapy: Secondary | ICD-10-CM | POA: Insufficient documentation

## 2022-02-22 DIAGNOSIS — C4371 Malignant melanoma of right lower limb, including hip: Secondary | ICD-10-CM

## 2022-02-22 DIAGNOSIS — C779 Secondary and unspecified malignant neoplasm of lymph node, unspecified: Secondary | ICD-10-CM

## 2022-02-22 LAB — CMP (CANCER CENTER ONLY)
ALT: 26 U/L (ref 0–44)
AST: 18 U/L (ref 15–41)
Albumin: 4.2 g/dL (ref 3.5–5.0)
Alkaline Phosphatase: 57 U/L (ref 38–126)
Anion gap: 8 (ref 5–15)
BUN: 17 mg/dL (ref 6–20)
CO2: 26 mmol/L (ref 22–32)
Calcium: 9.2 mg/dL (ref 8.9–10.3)
Chloride: 106 mmol/L (ref 98–111)
Creatinine: 1.01 mg/dL (ref 0.61–1.24)
GFR, Estimated: >60 mL/min (ref >60)
Glucose, Bld: 112 mg/dL — ABNORMAL HIGH (ref 70–99)
Potassium: 3.8 mmol/L (ref 3.5–5.1)
Sodium: 140 mmol/L (ref 135–145)
Total Bilirubin: 0.4 mg/dL (ref 0.3–1.2)
Total Protein: 6.7 g/dL (ref 6.5–8.1)

## 2022-02-22 LAB — CBC WITH DIFFERENTIAL (CANCER CENTER ONLY)
Abs Immature Granulocytes: 0.03 10*3/uL (ref 0.00–0.07)
Basophils Absolute: 0 10*3/uL (ref 0.0–0.1)
Basophils Relative: 1 %
Eosinophils Absolute: 0 10*3/uL (ref 0.0–0.5)
Eosinophils Relative: 1 %
HCT: 36.9 % — ABNORMAL LOW (ref 39.0–52.0)
Hemoglobin: 12.1 g/dL — ABNORMAL LOW (ref 13.0–17.0)
Immature Granulocytes: 2 %
Lymphocytes Relative: 12 %
Lymphs Abs: 0.2 10*3/uL — ABNORMAL LOW (ref 0.7–4.0)
MCH: 31.7 pg (ref 26.0–34.0)
MCHC: 32.8 g/dL (ref 30.0–36.0)
MCV: 96.6 fL (ref 80.0–100.0)
Monocytes Absolute: 0.4 10*3/uL (ref 0.1–1.0)
Monocytes Relative: 25 %
Neutro Abs: 1 10*3/uL — ABNORMAL LOW (ref 1.7–7.7)
Neutrophils Relative %: 59 %
Platelet Count: 151 10*3/uL (ref 150–400)
RBC: 3.82 MIL/uL — ABNORMAL LOW (ref 4.22–5.81)
RDW: 16.3 % — ABNORMAL HIGH (ref 11.5–15.5)
WBC Count: 1.7 10*3/uL — ABNORMAL LOW (ref 4.0–10.5)
nRBC: 1.2 % — ABNORMAL HIGH (ref 0.0–0.2)

## 2022-02-22 MED ORDER — SODIUM CHLORIDE 0.9 % IV SOLN
10.0000 mg | Freq: Once | INTRAVENOUS | Status: AC
  Administered 2022-02-22: 10 mg via INTRAVENOUS
  Filled 2022-02-22: qty 10

## 2022-02-22 MED ORDER — PALONOSETRON HCL INJECTION 0.25 MG/5ML
0.2500 mg | Freq: Once | INTRAVENOUS | Status: AC
  Administered 2022-02-22: 0.25 mg via INTRAVENOUS
  Filled 2022-02-22: qty 5

## 2022-02-22 MED ORDER — SODIUM CHLORIDE 0.9 % IV SOLN: Freq: Once | INTRAVENOUS | Status: AC

## 2022-02-22 MED ORDER — SODIUM CHLORIDE 0.9 % IV SOLN
150.0000 mg | Freq: Once | INTRAVENOUS | Status: AC
  Administered 2022-02-22: 150 mg via INTRAVENOUS
  Filled 2022-02-22: qty 150

## 2022-02-22 MED ORDER — PEGFILGRASTIM-CBQV 6 MG/0.6ML ~~LOC~~ SOSY: 6.0000 mg | PREFILLED_SYRINGE | SUBCUTANEOUS | Status: DC

## 2022-02-22 MED ORDER — SODIUM CHLORIDE 0.9 % IV SOLN
970.0000 mg/m2 | Freq: Once | INTRAVENOUS | Status: AC
  Administered 2022-02-22: 2400 mg via INTRAVENOUS
  Filled 2022-02-22: qty 240

## 2022-02-22 NOTE — Patient Instructions (Signed)
[facility] [place]  Discharge Instructions: Thank you for choosing [facility] to provide your oncology and hematology care.   If you have a lab appointment with the [facility], please go directly to the [facility] and check in at the registration area.  Wear comfortable clothing and clothing appropriate for easy access to any Portacath or PICC line.   We strive to give you quality time with your provider. You may need to reschedule your appointment if you arrive late (15 or more minutes).  Arriving late affects you and other patients whose appointments are after yours.  Also, if you miss three or more appointments without notifying the office, you may be dismissed from the clinic at the provider's discretion.      For prescription refill requests, have your pharmacy contact our office and allow 72 hours for refills to be completed.    Today you received the following chemotherapy and/or immunotherapy agents: DTIC      To help prevent nausea and vomiting after your treatment, we encourage you to take your nausea medication as directed.  BELOW ARE SYMPTOMS THAT SHOULD BE REPORTED IMMEDIATELY: *FEVER GREATER THAN 100.4 F (38 C) OR HIGHER *CHILLS OR SWEATING *NAUSEA AND VOMITING THAT IS NOT CONTROLLED WITH YOUR NAUSEA MEDICATION *UNUSUAL SHORTNESS OF BREATH *UNUSUAL BRUISING OR BLEEDING *URINARY PROBLEMS (pain or burning when urinating, or frequent urination) *BOWEL PROBLEMS (unusual diarrhea, constipation, pain near the anus) TENDERNESS IN MOUTH AND THROAT WITH OR WITHOUT PRESENCE OF ULCERS (sore throat, sores in mouth, or a toothache) UNUSUAL RASH, SWELLING OR PAIN  UNUSUAL VAGINAL DISCHARGE OR ITCHING   Items with * indicate a potential emergency and should be followed up as soon as possible or go to the Emergency Department if any problems should occur.  Please show the CHEMOTHERAPY ALERT CARD or IMMUNOTHERAPY ALERT CARD at check-in to the  Emergency Department and triage nurse. Should you have questions after your visit or need to cancel or reschedule your appointment, please contact [facility]  [phone] and follow the prompts.  Office hours are 8:00 a.m. to 4:30 p.m. Monday - Friday. Please note that voicemails left after 4:00 p.m. may not be returned until the following business day.  We are closed weekends and major holidays. You have access to a nurse at all times for urgent questions. Please call the main number to the clinic [phone] and follow the prompts.  For any non-urgent questions, you may also contact your provider using MyChart. We now offer e-Visits for anyone 41 and older to request care online for non-urgent symptoms. For details visit [email or web address].   Also download the MyChart app! Go to the app store, search "MyChart", open the app, select [facility], and log in with your MyChart username and password.  Masks are optional in the cancer centers. If you would like for your care team to wear a mask while they are taking care of you, please let them know. You may have one support person who is at least 56 years old accompany you for your appointments.

## 2022-02-22 NOTE — Progress Notes (Signed)
OK to treat today with WBC-1.7 and ANC-1.0 per order of Dr. Marin Olp.  Orders received for pt to get Neulasta tomorrow per order of Dr. Marin Olp.

## 2022-02-23 ENCOUNTER — Inpatient Hospital Stay: Payer: PPO

## 2022-02-23 ENCOUNTER — Other Ambulatory Visit: Payer: Self-pay | Admitting: Hematology & Oncology

## 2022-02-23 ENCOUNTER — Other Ambulatory Visit: Payer: Self-pay

## 2022-02-23 VITALS — BP 137/77 | HR 65 | Temp 98.1°F | Resp 18

## 2022-02-23 DIAGNOSIS — Z5111 Encounter for antineoplastic chemotherapy: Secondary | ICD-10-CM | POA: Diagnosis not present

## 2022-02-23 DIAGNOSIS — C7A8 Other malignant neuroendocrine tumors: Secondary | ICD-10-CM

## 2022-02-23 MED ORDER — PEGFILGRASTIM-CBQV 6 MG/0.6ML ~~LOC~~ SOSY
6.0000 mg | PREFILLED_SYRINGE | Freq: Once | SUBCUTANEOUS | Status: AC
  Administered 2022-02-23: 6 mg via SUBCUTANEOUS
  Filled 2022-02-23: qty 0.6

## 2022-02-23 NOTE — Patient Instructions (Signed)

## 2022-03-03 ENCOUNTER — Encounter: Payer: Self-pay | Admitting: Hematology & Oncology

## 2022-03-04 ENCOUNTER — Telehealth: Payer: Self-pay | Admitting: *Deleted

## 2022-03-04 ENCOUNTER — Inpatient Hospital Stay: Payer: PPO

## 2022-03-04 VITALS — BP 112/82 | HR 78 | Temp 98.0°F | Resp 18 | Ht 72.0 in | Wt 264.0 lb

## 2022-03-04 DIAGNOSIS — C779 Secondary and unspecified malignant neoplasm of lymph node, unspecified: Secondary | ICD-10-CM

## 2022-03-04 DIAGNOSIS — C4371 Malignant melanoma of right lower limb, including hip: Secondary | ICD-10-CM

## 2022-03-04 DIAGNOSIS — C7A8 Other malignant neuroendocrine tumors: Secondary | ICD-10-CM

## 2022-03-04 DIAGNOSIS — Z5111 Encounter for antineoplastic chemotherapy: Secondary | ICD-10-CM | POA: Diagnosis not present

## 2022-03-04 DIAGNOSIS — E860 Dehydration: Secondary | ICD-10-CM

## 2022-03-04 LAB — CBC WITH DIFFERENTIAL (CANCER CENTER ONLY)
Abs Immature Granulocytes: 0.01 10*3/uL (ref 0.00–0.07)
Basophils Absolute: 0 10*3/uL (ref 0.0–0.1)
Basophils Relative: 1 %
Eosinophils Absolute: 0.1 10*3/uL (ref 0.0–0.5)
Eosinophils Relative: 3 %
HCT: 32.8 % — ABNORMAL LOW (ref 39.0–52.0)
Hemoglobin: 11.2 g/dL — ABNORMAL LOW (ref 13.0–17.0)
Immature Granulocytes: 1 %
Lymphocytes Relative: 7 %
Lymphs Abs: 0.1 10*3/uL — ABNORMAL LOW (ref 0.7–4.0)
MCH: 32 pg (ref 26.0–34.0)
MCHC: 34.1 g/dL (ref 30.0–36.0)
MCV: 93.7 fL (ref 80.0–100.0)
Monocytes Absolute: 0.1 10*3/uL (ref 0.1–1.0)
Monocytes Relative: 7 %
Neutro Abs: 1.7 10*3/uL (ref 1.7–7.7)
Neutrophils Relative %: 81 %
Platelet Count: 68 10*3/uL — ABNORMAL LOW (ref 150–400)
RBC: 3.5 MIL/uL — ABNORMAL LOW (ref 4.22–5.81)
RDW: 18 % — ABNORMAL HIGH (ref 11.5–15.5)
WBC Count: 2.1 10*3/uL — ABNORMAL LOW (ref 4.0–10.5)
nRBC: 0 % (ref 0.0–0.2)

## 2022-03-04 LAB — CMP (CANCER CENTER ONLY)
ALT: 28 U/L (ref 0–44)
AST: 21 U/L (ref 15–41)
Albumin: 4.1 g/dL (ref 3.5–5.0)
Alkaline Phosphatase: 77 U/L (ref 38–126)
Anion gap: 11 (ref 5–15)
BUN: 25 mg/dL — ABNORMAL HIGH (ref 6–20)
CO2: 25 mmol/L (ref 22–32)
Calcium: 9 mg/dL (ref 8.9–10.3)
Chloride: 103 mmol/L (ref 98–111)
Creatinine: 0.99 mg/dL (ref 0.61–1.24)
GFR, Estimated: >60 mL/min (ref >60)
Glucose, Bld: 117 mg/dL — ABNORMAL HIGH (ref 70–99)
Potassium: 3.2 mmol/L — ABNORMAL LOW (ref 3.5–5.1)
Sodium: 139 mmol/L (ref 135–145)
Total Bilirubin: 0.6 mg/dL (ref 0.3–1.2)
Total Protein: 6.6 g/dL (ref 6.5–8.1)

## 2022-03-04 MED ORDER — SODIUM CHLORIDE 0.9 % IV SOLN: INTRAVENOUS | Status: DC

## 2022-03-04 NOTE — Telephone Encounter (Signed)
Message received from patient's wife stating that pt is very weak and that she believes pt needs IVF's.  Call placed back to pt.'s wife, Hoyle Sauer and she states that pt has been weak for the past two days, with no fever, chills or SOB.  Spoke with Saralyn Pilar who said that he has also been slightly nauseated and not able to eat or drink much the past two days.  Dr. Marin Olp notified and pt instructed to come in today for labs and IVF's.  Pt.'s wife states that they will be here between 11:30 and 12 and is appreciative of assistance.

## 2022-03-04 NOTE — Patient Instructions (Signed)
Dehydration, Adult Dehydration is a condition in which there is not enough water or other fluids in the body. This happens when a person loses more fluids than he or she takes in. Important organs, such as the kidneys, brain, and heart, cannot function without a proper amount of fluids. Any loss of fluids from the body can lead to dehydration. Dehydration can be mild, moderate, or severe. It should be treated right away to prevent it from becoming severe. What are the causes? Dehydration may be caused by: Conditions that cause loss of water or other fluids, such as diarrhea, vomiting, or sweating or urinating a lot. Not drinking enough fluids, especially when you are ill or doing activities that require a lot of energy. Other illnesses and conditions, such as fever or infection. Certain medicines, such as medicines that remove excess fluid from the body (diuretics). Lack of safe drinking water. Not being able to get enough water and food. What increases the risk? The following factors may make you more likely to develop this condition: Having a long-term (chronic) illness that has not been treated properly, such as diabetes, heart disease, or kidney disease. Being 65 years of age or older. Having a disability. Living in a place that is high in altitude, where thinner, drier air causes more fluid loss. Doing exercises that put stress on your body for a long time (endurance sports). What are the signs or symptoms? Symptoms of dehydration depend on how severe it is. Mild or moderate dehydration Thirst. Dry lips or dry mouth. Dizziness or light-headedness, especially when standing up from a seated position. Muscle cramps. Dark urine. Urine may be the color of tea. Less urine or tears produced than usual. Headache. Severe dehydration Changes in skin. Your skin may be cold and clammy, blotchy, or pale. Your skin also may not return to normal after being lightly pinched and released. Little or  no tears, urine, or sweat. Changes in vital signs, such as rapid breathing and low blood pressure. Your pulse may be weak or may be faster than 100 beats a minute when you are sitting still. Other changes, such as: Feeling very thirsty. Sunken eyes. Cold hands and feet. Confusion. Being very tired (lethargic) or having trouble waking from sleep. Short-term weight loss. Loss of consciousness. How is this diagnosed? This condition is diagnosed based on your symptoms and a physical exam. You may have blood and urine tests to help confirm the diagnosis. How is this treated? Treatment for this condition depends on how severe it is. Treatment should be started right away. Do not wait until dehydration becomes severe. Severe dehydration is an emergency and needs to be treated in a hospital. Mild or moderate dehydration can be treated at home. You may be asked to: Drink more fluids. Drink an oral rehydration solution (ORS). This drink helps restore proper amounts of fluids and salts and minerals in the blood (electrolytes). Severe dehydration can be treated: With IV fluids. By correcting abnormal levels of electrolytes. This is often done by giving electrolytes through a tube that is passed through your nose and into your stomach (nasogastric tube, or NG tube). By treating the underlying cause of dehydration. Follow these instructions at home: Oral rehydration solution If told by your health care provider, drink an ORS: Make an ORS by following instructions on the package. Start by drinking small amounts, about  cup (120 mL) every 5-10 minutes. Slowly increase how much you drink until you have taken the amount recommended by your health   care provider. Eating and drinking        Drink enough clear fluid to keep your urine pale yellow. If you were told to drink an ORS, finish the ORS first and then start slowly drinking other clear fluids. Drink fluids such as: Water. Do not drink only  water. Doing that can lead to hyponatremia, which is having too little salt (sodium) in the body. Water from ice chips you suck on. Fruit juice that you have added water to (diluted fruit juice). Low-calorie sports drinks. Eat foods that contain a healthy balance of electrolytes, such as bananas, oranges, potatoes, tomatoes, and spinach. Do not drink alcohol. Avoid the following: Drinks that contain a lot of sugar. These include high-calorie sports drinks, fruit juice that is not diluted, and soda. Caffeine. Foods that are greasy or contain a lot of fat or sugar. General instructions Take over-the-counter and prescription medicines only as told by your health care provider. Do not take sodium tablets. Doing that can lead to having too much sodium in the body (hypernatremia). Return to your normal activities as told by your health care provider. Ask your health care provider what activities are safe for you. Keep all follow-up visits as told by your health care provider. This is important. Contact a health care provider if: You have muscle cramps, pain, or discomfort, such as: Pain in your abdomen and the pain gets worse or stays in one area (localizes). Stiff neck. You have a rash. You are more irritable than usual. You are sleepier or have a harder time waking than usual. You feel weak or dizzy. You feel very thirsty. Get help right away if you have: Any symptoms of severe dehydration. Symptoms of vomiting, such as: You cannot eat or drink without vomiting. Vomiting gets worse or does not go away. Vomit includes blood or green matter (bile). Symptoms that get worse with treatment. A fever. A severe headache. Problems with urination or bowel movements, such as: Diarrhea that gets worse or does not go away. Blood in your stool (feces). This may cause stool to look black and tarry. Not urinating, or urinating only a small amount of very dark urine, within 6-8 hours. Trouble  breathing. These symptoms may represent a serious problem that is an emergency. Do not wait to see if the symptoms will go away. Get medical help right away. Call your local emergency services (911 in the [place] Do not drive yourself to the hospital. Summary Dehydration is a condition in which there is not enough water or other fluids in the body. This happens when a person loses more fluids than he or she takes in. Treatment for this condition depends on how severe it is. Treatment should be started right away. Do not wait until dehydration becomes severe. Drink enough clear fluid to keep your urine pale yellow. If you were told to drink an oral rehydration solution (ORS), finish the ORS first and then start slowly drinking other clear fluids. Take over-the-counter and prescription medicines only as told by your health care provider. Get help right away if you have any symptoms of severe dehydration. This information is not intended to replace advice given to you by your health care provider. Make sure you discuss any questions you have with your health care provider. Document Revised: 10/13/2021 Document Reviewed: 01/17/2019 Elsevier Patient Education  2023 Elsevier Inc.  

## 2022-03-05 ENCOUNTER — Other Ambulatory Visit: Payer: Self-pay

## 2022-03-07 ENCOUNTER — Encounter: Payer: Self-pay | Admitting: Radiation Oncology

## 2022-03-09 NOTE — Progress Notes (Incomplete)
  Radiation Oncology         [phone] ________________________________  Patient Name: James Byrd MRN: [id number] DOB: 1966/06/06 Referring Physician: Burney Gauze (Profile Not Attached) Date of Service: 02/07/2022 [facility], [place]                                                        End Of Treatment Note  Diagnoses: C77.5-Secondary and unspecified malignant neoplasm of intrapelvic lymph nodes C78.01-Secondary malignant neoplasm of right lung C78.02-Secondary malignant neoplasm of left lung C7A.8-Other malignant neuroendocrine tumors R91.8-Other nonspecific abnormal finding of lung field  Cancer Staging: The primary encounter diagnosis was Malignant melanoma of right great toe Stage IIIB ([id number]). A diagnosis of Lung nodules was also pertinent to this visit.   Metastatic melanoma; new metastatic pulmonary nodules in the RUL and LLL    The primary encounter diagnosis was Malignant melanoma of right great toe Stage IIIB ([id number]). Diagnoses of Neuroendocrine carcinoma of pancreas ([facility]) and Metastatic melanoma to lymph node [facility]) were also pertinent to this visit.   Stage IIIB (T3bN1aM0) subungual melanoma of the right hallux; nodal recurrence in the right inguinal nodes; BRAF with right common iliac node recurrence; progression of melanoma-lymph node metastasis 02/2021   Neuroendocrine carcinoma of the pancreas  Intent: Curative  Radiation Treatment Dates: 01/25/2022 through 02/07/2022 Site Technique Total Dose (Gy) Dose per Fx (Gy) Completed Fx Beam Energies  Lung, Right: Lung_R IMRT 50/50 5 10/10 6XFFF  Lung, Left: Lung_L IMRT 50/50 5 10/10 10XFFF   Narrative: The patient tolerated radiation therapy relatively well. On the date of his final treatment, the patient reported pain rated at a 5/10, fatigue, red skin blotches from head to toe, and SOB with exertion. [facility] beam imaging reviewed with the patient on the date of his final treatment showed  significant tumor shrinkage in both lungs.   Plan: The patient will follow-up with radiation oncology in one month .  ________________________________________________ -----------------------------------  Blair Promise, PhD, MD  This document serves as a record of services personally performed by Gery Pray, MD. It was created on his behalf by Roney Mans, a trained medical scribe. The creation of this record is based on the scribe's personal observations and the provider's statements to them. This document has been checked and approved by the attending provider.

## 2022-03-10 ENCOUNTER — Ambulatory Visit
Admission: RE | Admit: 2022-03-10 | Discharge: 2022-03-10 | Disposition: A | Discharge status: Home / Self Care | Payer: PPO | Source: Ambulatory Visit | Attending: Radiation Oncology | Admitting: Radiation Oncology

## 2022-03-10 ENCOUNTER — Telehealth: Payer: Self-pay | Admitting: *Deleted

## 2022-03-10 DIAGNOSIS — C779 Secondary and unspecified malignant neoplasm of lymph node, unspecified: Secondary | ICD-10-CM | POA: Insufficient documentation

## 2022-03-10 DIAGNOSIS — C4371 Malignant melanoma of right lower limb, including hip: Secondary | ICD-10-CM | POA: Insufficient documentation

## 2022-03-10 HISTORY — DX: Personal history of irradiation: Z92.3

## 2022-03-10 NOTE — Telephone Encounter (Signed)
RETURNED PATIENT'S CALL TO RESCHEDULE TODAY'S APPT., LVM FOR A RETURN CALL

## 2022-03-11 ENCOUNTER — Encounter: Payer: Self-pay | Admitting: Hematology & Oncology

## 2022-03-14 ENCOUNTER — Encounter: Payer: Self-pay | Admitting: *Deleted

## 2022-03-15 ENCOUNTER — Telehealth: Payer: Self-pay

## 2022-03-15 NOTE — Progress Notes (Signed)
Error

## 2022-03-15 NOTE — Telephone Encounter (Signed)
MyChart message sent to patient.

## 2022-03-23 ENCOUNTER — Encounter: Payer: Self-pay | Admitting: Hematology & Oncology

## 2022-03-23 ENCOUNTER — Inpatient Hospital Stay: Payer: PPO

## 2022-03-23 ENCOUNTER — Inpatient Hospital Stay: Payer: PPO | Attending: Hematology & Oncology

## 2022-03-23 ENCOUNTER — Other Ambulatory Visit: Payer: Self-pay

## 2022-03-23 ENCOUNTER — Inpatient Hospital Stay (HOSPITAL_BASED_OUTPATIENT_CLINIC_OR_DEPARTMENT_OTHER): Payer: PPO | Admitting: Hematology & Oncology

## 2022-03-23 ENCOUNTER — Other Ambulatory Visit: Payer: Self-pay | Admitting: Family

## 2022-03-23 VITALS — BP 107/71 | HR 94 | Resp 18

## 2022-03-23 VITALS — BP 127/85 | HR 78 | Temp 97.9°F | Resp 16 | Ht 72.0 in | Wt 270.0 lb

## 2022-03-23 DIAGNOSIS — Z5111 Encounter for antineoplastic chemotherapy: Secondary | ICD-10-CM | POA: Diagnosis present

## 2022-03-23 DIAGNOSIS — C779 Secondary and unspecified malignant neoplasm of lymph node, unspecified: Secondary | ICD-10-CM

## 2022-03-23 DIAGNOSIS — D701 Agranulocytosis secondary to cancer chemotherapy: Secondary | ICD-10-CM | POA: Diagnosis not present

## 2022-03-23 DIAGNOSIS — Z7952 Long term (current) use of systemic steroids: Secondary | ICD-10-CM | POA: Insufficient documentation

## 2022-03-23 DIAGNOSIS — Z923 Personal history of irradiation: Secondary | ICD-10-CM | POA: Diagnosis not present

## 2022-03-23 DIAGNOSIS — C4371 Malignant melanoma of right lower limb, including hip: Secondary | ICD-10-CM

## 2022-03-23 DIAGNOSIS — N179 Acute kidney failure, unspecified: Secondary | ICD-10-CM

## 2022-03-23 DIAGNOSIS — R5383 Other fatigue: Secondary | ICD-10-CM | POA: Diagnosis not present

## 2022-03-23 DIAGNOSIS — C778 Secondary and unspecified malignant neoplasm of lymph nodes of multiple regions: Secondary | ICD-10-CM | POA: Insufficient documentation

## 2022-03-23 LAB — CBC WITH DIFFERENTIAL (CANCER CENTER ONLY)
Abs Immature Granulocytes: 0.05 10*3/uL (ref 0.00–0.07)
Basophils Absolute: 0 10*3/uL (ref 0.0–0.1)
Basophils Relative: 0 %
Eosinophils Absolute: 0 10*3/uL (ref 0.0–0.5)
Eosinophils Relative: 0 %
HCT: 36.4 % — ABNORMAL LOW (ref 39.0–52.0)
Hemoglobin: 12.3 g/dL — ABNORMAL LOW (ref 13.0–17.0)
Immature Granulocytes: 1 %
Lymphocytes Relative: 7 %
Lymphs Abs: 0.3 10*3/uL — ABNORMAL LOW (ref 0.7–4.0)
MCH: 32.8 pg (ref 26.0–34.0)
MCHC: 33.8 g/dL (ref 30.0–36.0)
MCV: 97.1 fL (ref 80.0–100.0)
Monocytes Absolute: 0.6 10*3/uL (ref 0.1–1.0)
Monocytes Relative: 14 %
Neutro Abs: 3.6 10*3/uL (ref 1.7–7.7)
Neutrophils Relative %: 78 %
Platelet Count: 206 10*3/uL (ref 150–400)
RBC: 3.75 MIL/uL — ABNORMAL LOW (ref 4.22–5.81)
RDW: 19.8 % — ABNORMAL HIGH (ref 11.5–15.5)
WBC Count: 4.6 10*3/uL (ref 4.0–10.5)
nRBC: 0.7 % — ABNORMAL HIGH (ref 0.0–0.2)

## 2022-03-23 LAB — CMP (CANCER CENTER ONLY)
ALT: 25 U/L (ref 0–44)
AST: 16 U/L (ref 15–41)
Albumin: 4.4 g/dL (ref 3.5–5.0)
Alkaline Phosphatase: 68 U/L (ref 38–126)
Anion gap: 10 (ref 5–15)
BUN: 19 mg/dL (ref 6–20)
CO2: 24 mmol/L (ref 22–32)
Calcium: 9.4 mg/dL (ref 8.9–10.3)
Chloride: 106 mmol/L (ref 98–111)
Creatinine: 1.12 mg/dL (ref 0.61–1.24)
GFR, Estimated: >60 mL/min (ref >60)
Glucose, Bld: 105 mg/dL — ABNORMAL HIGH (ref 70–99)
Potassium: 3.8 mmol/L (ref 3.5–5.1)
Sodium: 140 mmol/L (ref 135–145)
Total Bilirubin: 0.4 mg/dL (ref 0.3–1.2)
Total Protein: 7 g/dL (ref 6.5–8.1)

## 2022-03-23 LAB — IRON AND IRON BINDING CAPACITY (CC-WL,HP ONLY)
Iron: 105 ug/dL (ref 45–182)
Saturation Ratios: 39 % (ref 17.9–39.5)
TIBC: 270 ug/dL (ref 250–450)
UIBC: 165 ug/dL (ref 117–376)

## 2022-03-23 LAB — LACTATE DEHYDROGENASE: LDH: 209 U/L — ABNORMAL HIGH (ref 98–192)

## 2022-03-23 LAB — TSH: TSH: 2.724 u[IU]/mL (ref 0.350–4.500)

## 2022-03-23 LAB — FERRITIN: Ferritin: 1424 ng/mL — ABNORMAL HIGH (ref 24–336)

## 2022-03-23 MED ORDER — PALONOSETRON HCL INJECTION 0.25 MG/5ML
0.2500 mg | Freq: Once | INTRAVENOUS | Status: AC
  Administered 2022-03-23: 0.25 mg via INTRAVENOUS
  Filled 2022-03-23: qty 5

## 2022-03-23 MED ORDER — SODIUM CHLORIDE 0.9 % IV SOLN
10.0000 mg | Freq: Once | INTRAVENOUS | Status: AC
  Administered 2022-03-23: 10 mg via INTRAVENOUS
  Filled 2022-03-23: qty 10

## 2022-03-23 MED ORDER — SODIUM CHLORIDE 0.9 % IV SOLN
150.0000 mg | Freq: Once | INTRAVENOUS | Status: AC
  Administered 2022-03-23: 150 mg via INTRAVENOUS
  Filled 2022-03-23: qty 150

## 2022-03-23 MED ORDER — SOLIFENACIN SUCCINATE 10 MG PO TABS: 10.0000 mg | ORAL_TABLET | Freq: Every day | ORAL | 6 refills | Status: AC

## 2022-03-23 MED ORDER — SODIUM CHLORIDE 0.9 % IV SOLN
970.0000 mg/m2 | Freq: Once | INTRAVENOUS | Status: AC
  Administered 2022-03-23: 2400 mg via INTRAVENOUS
  Filled 2022-03-23: qty 240

## 2022-03-23 MED ORDER — SODIUM CHLORIDE 0.9 % IV SOLN: Freq: Once | INTRAVENOUS | Status: AC

## 2022-03-23 NOTE — Patient Instructions (Signed)
[facility]  Discharge Instructions: Thank you for choosing [facility] to provide your oncology and hematology care.   If you have a lab appointment with the [facility], please go directly to the [facility] and check in at the registration area.  Wear comfortable clothing and clothing appropriate for easy access to any Portacath or PICC line.   We strive to give you quality time with your provider. You may need to reschedule your appointment if you arrive late (15 or more minutes).  Arriving late affects you and other patients whose appointments are after yours.  Also, if you miss three or more appointments without notifying the office, you may be dismissed from the clinic at the provider's discretion.      For prescription refill requests, have your pharmacy contact our office and allow 72 hours for refills to be completed.    Today you received the following chemotherapy and/or immunotherapy agents DTIC      To help prevent nausea and vomiting after your treatment, we encourage you to take your nausea medication as directed.  BELOW ARE SYMPTOMS THAT SHOULD BE REPORTED IMMEDIATELY: *FEVER GREATER THAN 100.4 F (38 C) OR HIGHER *CHILLS OR SWEATING *NAUSEA AND VOMITING THAT IS NOT CONTROLLED WITH YOUR NAUSEA MEDICATION *UNUSUAL SHORTNESS OF BREATH *UNUSUAL BRUISING OR BLEEDING *URINARY PROBLEMS (pain or burning when urinating, or frequent urination) *BOWEL PROBLEMS (unusual diarrhea, constipation, pain near the anus) TENDERNESS IN MOUTH AND THROAT WITH OR WITHOUT PRESENCE OF ULCERS (sore throat, sores in mouth, or a toothache) UNUSUAL RASH, SWELLING OR PAIN  UNUSUAL VAGINAL DISCHARGE OR ITCHING   Items with * indicate a potential emergency and should be followed up as soon as possible or go to the Emergency Department if any problems should occur.  Please show the CHEMOTHERAPY ALERT CARD or IMMUNOTHERAPY ALERT CARD at check-in to the  Emergency Department and triage nurse. Should you have questions after your visit or need to cancel or reschedule your appointment, please contact [facility]  [phone] and follow the prompts.  Office hours are 8:00 a.m. to 4:30 p.m. Monday - Friday. Please note that voicemails left after 4:00 p.m. may not be returned until the following business day.  We are closed weekends and major holidays. You have access to a nurse at all times for urgent questions. Please call the main number to the clinic [phone] and follow the prompts.  For any non-urgent questions, you may also contact your provider using MyChart. We now offer e-Visits for anyone 76 and older to request care online for non-urgent symptoms. For details visit [email or web address].   Also download the MyChart app! Go to the app store, search "MyChart", open the app, select [facility], and log in with your MyChart username and password.  Masks are optional in the cancer centers. If you would like for your care team to wear a mask while they are taking care of you, please let them know. You may have one support person who is at least 56 years old accompany you for your appointments.

## 2022-03-23 NOTE — Progress Notes (Signed)
Hematology and Oncology Follow Up Visit  James Byrd [id number] March 22, 1966 56 y.o. 03/23/2022   Principle Diagnosis:  Stage IIIB (T3bN1aM0) subungual melanoma of the right hallux -- nodal recurrence in the RIGHT inguinal nodes -- BRAF wt RIGHT Common Iliac node recurrence Progression of melanoma-lymph node metastasis- 02/2021 Neuroendocrine carcinoma of the pancreas Iron def anemia   Past Therapy:        Yervoy-status post 3 cycles - discontinued in September 2016 secondary to toxicity Somatuline 120 mg IM q month -- started on 05/22/2019 - d/c on 08/2019 XRT to the RIGHT inguinal basin XRT to abdominal lymph nodes-completed 09/21/2021  Current Therapy:   Nivolumab 480 mg q month -- started on 03/19/2021, s/p cycle #4 IV Iron as indicated  Status post surgical resection of abdominal disease - 11/2021 SBRT-lung nodules -start on 01/25/2022 Dacarbazine - IV -s/p cycle #2 -- start on 01/26/2022    Interim History:  James Byrd is here today for follow-up.  He does feel a little tired.  We are checking his thyroid, testosterone, and iron studies.  No he has been on supplemental testosterone in the past.  He says he is really not been taking this all that much.  He has had 2 cycles of the dacarbazine.  We do have to give a dose of Neupogen afterwards because of leukopenia.  He has had no problems with his appetite.  He has had no nausea or vomiting.  There is been no change in bowel or bladder habits.  He has had no bleeding.  He has had no leg swelling.  He does have bad arthritis.  He is on prednisone for this.  He has had no rashes.  Overall, I would say his performance status is probably ECOG 1.     Medications:  Allergies as of 03/23/2022   No Known Allergies      Medication List        Accurate as of March 23, 2022  8:31 AM. If you have any questions, ask your nurse or doctor.          acetaminophen 650 MG CR tablet Commonly known as: TYLENOL Take 1,300 mg by  mouth daily as needed for pain.   augmented betamethasone dipropionate 0.05 % cream Commonly known as: DIPROLENE-AF Apply 1 application topically 2 (two) times daily as needed (psoriasis).   BLACK CURRANT SEED OIL PO Take 2 tablets by mouth daily.   cyanocobalamin 2000 MCG tablet Take 2,000 mcg by mouth daily. Vitamin b12   diclofenac sodium 1 % Gel Commonly known as: VOLTAREN Apply 2 g topically daily as needed (pain).   DULoxetine 20 MG capsule Commonly known as: CYMBALTA Take 20 mg by mouth daily.   FISH OIL PO Take by mouth daily.   folic acid 1 MG tablet Commonly known as: FOLVITE Take 2 tablets (2 mg total) by mouth daily.   hydrocortisone 5 MG tablet Commonly known as: CORTEF Take 1 tablet (5 mg total) by mouth 3 (three) times daily.   leflunomide 10 MG tablet Commonly known as: ARAVA Take 2 tablets (20 mg total) by mouth daily.   multivitamin with minerals Tabs tablet Take 1 tablet by mouth daily.   naloxone 4 MG/0.1ML Liqd nasal spray kit Commonly known as: NARCAN   ondansetron 8 MG disintegrating tablet Commonly known as: ZOFRAN-ODT Take 1 tablet (8 mg total) by mouth every 8 (eight) hours as needed for nausea or vomiting.   OVER THE COUNTER MEDICATION Take 0.5 each by mouth  as needed. CBD gummies   oxyCODONE-acetaminophen 10-325 MG tablet Commonly known as: PERCOCET Take 1 tablet by mouth every 4 (four) hours as needed for pain.   predniSONE 10 MG tablet Commonly known as: DELTASONE Take 1 tablet (10 mg total) by mouth daily with breakfast.   pregabalin 75 MG capsule Commonly known as: LYRICA Take 1 capsule (75 mg total) by mouth 2 (two) times daily.   pyridOXINE 100 MG tablet Commonly known as: VITAMIN B6 Take 100 mg by mouth daily.   Testosterone 20.25 MG/ACT (1.62%) Gel   vitamin C 1000 MG tablet Take 1,000 mg by mouth daily.   Vitamin D 50 MCG (2000 UT) tablet Take 2,000 Units by mouth daily.   zinc gluconate 50 MG tablet Take 50  mg by mouth daily.        Allergies: No Known Allergies  Past Medical History, Surgical history, Social history, and Family History were reviewed and updated.  Review of Systems: Review of Systems  Constitutional: Negative.   HENT: Negative.    Eyes: Negative.   Respiratory: Negative.    Cardiovascular: Negative.   Gastrointestinal: Negative.   Genitourinary: Negative.   Musculoskeletal:  Positive for joint pain and myalgias.  Skin: Negative.   Neurological: Negative.   Endo/Heme/Allergies: Negative.   Psychiatric/Behavioral: Negative.      Physical Exam:  vitals were not taken for this visit.   Wt Readings from Last 3 Encounters:  03/04/22 264 lb (119.7 kg)  02/16/22 265 lb 6.4 oz (120.4 kg)  01/20/22 259 lb (117.5 kg)    Physical Exam Vitals reviewed.  HENT:     Head: Normocephalic and atraumatic.  Eyes:     Pupils: Pupils are equal, round, and reactive to light.  Cardiovascular:     Rate and Rhythm: Normal rate and regular rhythm.     Heart sounds: Normal heart sounds.  Pulmonary:     Effort: Pulmonary effort is normal.     Breath sounds: Normal breath sounds.  Abdominal:     General: Bowel sounds are normal.     Palpations: Abdomen is soft.  Musculoskeletal:        General: No tenderness or deformity. Normal range of motion.     Cervical back: Normal range of motion.  Lymphadenopathy:     Cervical: No cervical adenopathy.  Skin:    General: Skin is warm and dry.     Findings: No erythema or rash.  Neurological:     Mental Status: He is alert and oriented to person, place, and time.  Psychiatric:        Behavior: Behavior normal.        Thought Content: Thought content normal.        Judgment: Judgment normal.      Lab Results  Component Value Date   WBC 4.6 03/23/2022   HGB 12.3 (L) 03/23/2022   HCT 36.4 (L) 03/23/2022   MCV 97.1 03/23/2022   PLT 206 03/23/2022   Lab Results  Component Value Date   FERRITIN 1,365 (H) 02/16/2022   IRON  107 02/16/2022   TIBC 238 (L) 02/16/2022   UIBC 131 02/16/2022   IRONPCTSAT 45 (H) 02/16/2022   Lab Results  Component Value Date   RETICCTPCT 1.6 11/27/2019   RBC 3.75 (L) 03/23/2022   No results found for: "KPAFRELGTCHN", "LAMBDASER", "[facility]" Lab Results  Component Value Date   IGGSERUM 1,046 03/25/2020   IGMSERUM 64 03/25/2020   Lab Results  Component Value Date   ALBUMINELP  4.3 03/25/2020   A1GS 0.3 03/25/2020   A2GS 0.7 03/25/2020   BETS 0.4 03/25/2020   BETA2SER 0.3 03/25/2020   GAMS 0.9 03/25/2020   SPEI  03/25/2020     Comment:     Normal Serum Protein Electrophoresis Pattern. No abnormal protein bands (M-protein) detected.      Chemistry      Component Value Date/Time   NA 139 03/04/2022 1233   NA 138 06/04/2020 1003   NA 145 06/06/2017 1316   NA 141 10/16/2015 1334   K 3.2 (L) 03/04/2022 1233   K 4.1 06/06/2017 1316   K 3.9 10/16/2015 1334   CL 103 03/04/2022 1233   CL 108 06/06/2017 1316   CO2 25 03/04/2022 1233   CO2 26 06/06/2017 1316   CO2 21 (L) 10/16/2015 1334   BUN 25 (H) 03/04/2022 1233   BUN 17 06/04/2020 1003   BUN 19 06/06/2017 1316   BUN 20.0 10/16/2015 1334   CREATININE 0.99 03/04/2022 1233   CREATININE 0.79 11/03/2021 1528   CREATININE 1.0 10/16/2015 1334      Component Value Date/Time   CALCIUM 9.0 03/04/2022 1233   CALCIUM 9.4 06/06/2017 1316   CALCIUM 9.7 10/16/2015 1334   ALKPHOS 77 03/04/2022 1233   ALKPHOS 78 06/06/2017 1316   ALKPHOS 62 10/16/2015 1334   AST 21 03/04/2022 1233   AST 16 10/16/2015 1334   ALT 28 03/04/2022 1233   ALT 27 06/06/2017 1316   ALT 11 10/16/2015 1334   BILITOT 0.6 03/04/2022 1233   BILITOT 0.49 10/16/2015 1334       Impression and Plan: Mr. Velardi is a very nice  56 yo caucasian gentleman with history of stage IIIB melanoma of the right hallux that was subungual with one microscopic positive inguinal lymph node. He completed 3 cycles of Yervoy but stopped due to side effects.   He  has had multiple recurrences.  He has had radiation for these.  He has had treatment for these.  He ultimately has had surgery to try to resect out what has recurred in his abdomen.  He is having a lot of pain and that was a motivation behind the surgery.  Again, we will have given him Neulasta to help with his white cell count.  I will give him 4 cycles of treatment and then we will plan for another CT scan to see how things are looking.  We will see what his thyroid, testosterone and iron studies show.  Hopefully, we can find some that we can treat to try to help with his fatigue.  I will plan to get him back here in another month.    [name], MD 10/4/20238:31 AM

## 2022-03-24 ENCOUNTER — Inpatient Hospital Stay: Payer: PPO

## 2022-03-24 ENCOUNTER — Other Ambulatory Visit: Payer: Self-pay

## 2022-03-24 VITALS — BP 140/76 | HR 58 | Temp 97.8°F | Resp 18

## 2022-03-24 DIAGNOSIS — C7A8 Other malignant neuroendocrine tumors: Secondary | ICD-10-CM

## 2022-03-24 DIAGNOSIS — Z5111 Encounter for antineoplastic chemotherapy: Secondary | ICD-10-CM | POA: Diagnosis not present

## 2022-03-24 MED ORDER — PEGFILGRASTIM-CBQV 6 MG/0.6ML ~~LOC~~ SOSY
6.0000 mg | PREFILLED_SYRINGE | Freq: Once | SUBCUTANEOUS | Status: AC
  Administered 2022-03-24: 6 mg via SUBCUTANEOUS
  Filled 2022-03-24: qty 0.6

## 2022-03-24 NOTE — Patient Instructions (Signed)

## 2022-03-25 ENCOUNTER — Other Ambulatory Visit: Payer: Self-pay

## 2022-03-29 LAB — TESTOSTERONE, FREE, TOTAL, SHBG
Sex Hormone Binding: 93.1 nmol/L — ABNORMAL HIGH (ref 19.3–76.4)
Testosterone, Free: 2.1 pg/mL — ABNORMAL LOW (ref 7.2–24.0)
Testosterone: 581 ng/dL (ref 264–916)

## 2022-04-04 ENCOUNTER — Other Ambulatory Visit: Payer: Self-pay | Admitting: Physician Assistant

## 2022-04-04 DIAGNOSIS — C779 Secondary and unspecified malignant neoplasm of lymph node, unspecified: Secondary | ICD-10-CM

## 2022-04-05 ENCOUNTER — Ambulatory Visit (HOSPITAL_COMMUNITY): Payer: PPO

## 2022-04-05 ENCOUNTER — Inpatient Hospital Stay (HOSPITAL_COMMUNITY): Admission: RE | Admit: 2022-04-05 | Payer: PPO | Source: Ambulatory Visit

## 2022-04-19 ENCOUNTER — Encounter: Payer: Self-pay | Admitting: Hematology & Oncology

## 2022-04-19 ENCOUNTER — Inpatient Hospital Stay: Payer: PPO

## 2022-04-19 ENCOUNTER — Inpatient Hospital Stay (HOSPITAL_BASED_OUTPATIENT_CLINIC_OR_DEPARTMENT_OTHER): Payer: PPO | Admitting: Hematology & Oncology

## 2022-04-19 VITALS — BP 127/82 | HR 70 | Temp 98.1°F | Resp 18 | Ht 72.0 in | Wt 271.1 lb

## 2022-04-19 DIAGNOSIS — C4371 Malignant melanoma of right lower limb, including hip: Secondary | ICD-10-CM

## 2022-04-19 DIAGNOSIS — Z5111 Encounter for antineoplastic chemotherapy: Secondary | ICD-10-CM | POA: Diagnosis not present

## 2022-04-19 DIAGNOSIS — C779 Secondary and unspecified malignant neoplasm of lymph node, unspecified: Secondary | ICD-10-CM

## 2022-04-19 DIAGNOSIS — N179 Acute kidney failure, unspecified: Secondary | ICD-10-CM

## 2022-04-19 LAB — CMP (CANCER CENTER ONLY)
ALT: 32 U/L (ref 0–44)
AST: 20 U/L (ref 15–41)
Albumin: 4.5 g/dL (ref 3.5–5.0)
Alkaline Phosphatase: 80 U/L (ref 38–126)
Anion gap: 10 (ref 5–15)
BUN: 19 mg/dL (ref 6–20)
CO2: 24 mmol/L (ref 22–32)
Calcium: 9.4 mg/dL (ref 8.9–10.3)
Chloride: 106 mmol/L (ref 98–111)
Creatinine: 1.02 mg/dL (ref 0.61–1.24)
GFR, Estimated: >60 mL/min (ref >60)
Glucose, Bld: 101 mg/dL — ABNORMAL HIGH (ref 70–99)
Potassium: 3.5 mmol/L (ref 3.5–5.1)
Sodium: 140 mmol/L (ref 135–145)
Total Bilirubin: 0.4 mg/dL (ref 0.3–1.2)
Total Protein: 6.6 g/dL (ref 6.5–8.1)

## 2022-04-19 LAB — CBC WITH DIFFERENTIAL (CANCER CENTER ONLY)
Abs Immature Granulocytes: 0.04 10*3/uL (ref 0.00–0.07)
Basophils Absolute: 0 10*3/uL (ref 0.0–0.1)
Basophils Relative: 1 %
Eosinophils Absolute: 0 10*3/uL (ref 0.0–0.5)
Eosinophils Relative: 1 %
HCT: 35.2 % — ABNORMAL LOW (ref 39.0–52.0)
Hemoglobin: 12.1 g/dL — ABNORMAL LOW (ref 13.0–17.0)
Immature Granulocytes: 1 %
Lymphocytes Relative: 10 %
Lymphs Abs: 0.4 10*3/uL — ABNORMAL LOW (ref 0.7–4.0)
MCH: 34.7 pg — ABNORMAL HIGH (ref 26.0–34.0)
MCHC: 34.4 g/dL (ref 30.0–36.0)
MCV: 100.9 fL — ABNORMAL HIGH (ref 80.0–100.0)
Monocytes Absolute: 0.7 10*3/uL (ref 0.1–1.0)
Monocytes Relative: 19 %
Neutro Abs: 2.6 10*3/uL (ref 1.7–7.7)
Neutrophils Relative %: 68 %
Platelet Count: 219 10*3/uL (ref 150–400)
RBC: 3.49 MIL/uL — ABNORMAL LOW (ref 4.22–5.81)
RDW: 17.6 % — ABNORMAL HIGH (ref 11.5–15.5)
WBC Count: 3.8 10*3/uL — ABNORMAL LOW (ref 4.0–10.5)
nRBC: 0 % (ref 0.0–0.2)

## 2022-04-19 LAB — LACTATE DEHYDROGENASE: LDH: 218 U/L — ABNORMAL HIGH (ref 98–192)

## 2022-04-19 MED ORDER — SODIUM CHLORIDE 0.9 % IV SOLN: Freq: Once | INTRAVENOUS | Status: AC

## 2022-04-19 MED ORDER — SODIUM CHLORIDE 0.9 % IV SOLN
150.0000 mg | Freq: Once | INTRAVENOUS | Status: AC
  Administered 2022-04-19: 150 mg via INTRAVENOUS
  Filled 2022-04-19: qty 150

## 2022-04-19 MED ORDER — SODIUM CHLORIDE 0.9 % IV SOLN
10.0000 mg | Freq: Once | INTRAVENOUS | Status: AC
  Administered 2022-04-19: 10 mg via INTRAVENOUS
  Filled 2022-04-19: qty 10

## 2022-04-19 MED ORDER — PALONOSETRON HCL INJECTION 0.25 MG/5ML
0.2500 mg | Freq: Once | INTRAVENOUS | Status: AC
  Administered 2022-04-19: 0.25 mg via INTRAVENOUS
  Filled 2022-04-19: qty 5

## 2022-04-19 MED ORDER — SODIUM CHLORIDE 0.9 % IV SOLN
970.0000 mg/m2 | Freq: Once | INTRAVENOUS | Status: AC
  Administered 2022-04-19: 2400 mg via INTRAVENOUS
  Filled 2022-04-19: qty 240

## 2022-04-19 MED ORDER — OLANZAPINE 5 MG PO TABS: 5.0000 mg | ORAL_TABLET | Freq: Every day | ORAL | 4 refills | Status: DC

## 2022-04-19 NOTE — Addendum Note (Signed)
Addended by: Burney Gauze R on: 04/19/2022 11:22 AM   Modules accepted: Orders

## 2022-04-19 NOTE — Progress Notes (Signed)
Hematology and Oncology Follow Up Visit  James Byrd [id number] 1966-02-06 56 y.o. 04/19/2022   Principle Diagnosis:  Stage IIIB (T3bN1aM0) subungual melanoma of the right hallux -- nodal recurrence in the RIGHT inguinal nodes -- BRAF wt RIGHT Common Iliac node recurrence Progression of melanoma-lymph node metastasis- 02/2021 Neuroendocrine carcinoma of the pancreas Iron def anemia   Past Therapy:        Yervoy-status post 3 cycles - discontinued in September 2016 secondary to toxicity Somatuline 120 mg IM q month -- started on 05/22/2019 - d/c on 08/2019 XRT to the RIGHT inguinal basin XRT to abdominal lymph nodes-completed 09/21/2021  Current Therapy:   Nivolumab 480 mg q month -- started on 03/19/2021, s/p cycle #4 IV Iron as indicated  Status post surgical resection of abdominal disease - 11/2021 SBRT-lung nodules -start on 01/25/2022 Dacarbazine - IV -s/p cycle #3 -- start on 01/26/2022    Interim History:  [name] is here today for follow-up.  He really feels good.  He has had no problems with respect to nausea or vomiting.  His arthritis is little better.  He is on prednisone.  He tapers the prednisone down for this.  He actually played golf yesterday.  He enjoyed this.  He wants to try to lose a little bit of weight.  Over last saw him, we checked a testosterone level on him.  This was okay.  I think was about 551.  We checked his iron studies more last saw him.  His ferritin was 1400 with an iron saturation of 39%.  His TSH when we saw him back in October was 2.74.  He has had no problems with fever.  He has had no bleeding.  There is been no change in bowel or bladder habits.  Overall, I would have said that his performance status is probably ECOG 1.     Medications:  Allergies as of 04/19/2022   No Known Allergies      Medication List        Accurate as of April 19, 2022  8:54 AM. If you have any questions, ask your nurse or doctor.           acetaminophen 650 MG CR tablet Commonly known as: TYLENOL Take 1,300 mg by mouth daily as needed for pain.   augmented betamethasone dipropionate 0.05 % cream Commonly known as: DIPROLENE-AF Apply 1 application topically 2 (two) times daily as needed (psoriasis).   BLACK CURRANT SEED OIL PO Take 2 tablets by mouth daily.   cyanocobalamin 2000 MCG tablet Take 2,000 mcg by mouth daily. Vitamin b12   diclofenac sodium 1 % Gel Commonly known as: VOLTAREN Apply 2 g topically daily as needed (pain).   DULoxetine 20 MG capsule Commonly known as: CYMBALTA Take 20 mg by mouth daily.   FISH OIL PO Take by mouth daily.   folic acid 1 MG tablet Commonly known as: FOLVITE Take 2 tablets (2 mg total) by mouth daily.   hydrocortisone 5 MG tablet Commonly known as: CORTEF Take 1 tablet (5 mg total) by mouth 3 (three) times daily.   leflunomide 10 MG tablet Commonly known as: ARAVA Take 2 tablets (20 mg total) by mouth daily.   multivitamin with minerals Tabs tablet Take 1 tablet by mouth daily.   ondansetron 8 MG disintegrating tablet Commonly known as: ZOFRAN-ODT Take 1 tablet (8 mg total) by mouth every 8 (eight) hours as needed for nausea or vomiting.   OVER THE COUNTER MEDICATION Take 0.5  each by mouth as needed. CBD gummies   oxyCODONE-acetaminophen 10-325 MG tablet Commonly known as: PERCOCET Take 1 tablet by mouth every 4 (four) hours as needed for pain.   predniSONE 10 MG tablet Commonly known as: DELTASONE Take 1 tablet (10 mg total) by mouth daily with breakfast.   pregabalin 75 MG capsule Commonly known as: LYRICA Take 1 capsule (75 mg total) by mouth 2 (two) times daily.   pyridOXINE 100 MG tablet Commonly known as: VITAMIN B6 Take 100 mg by mouth daily.   solifenacin 10 MG tablet Commonly known as: VESICARE Take 1 tablet (10 mg total) by mouth daily.   Testosterone 20.25 MG/ACT (1.62%) Gel   vitamin C 1000 MG tablet Take 1,000 mg by mouth  daily.   Vitamin D 50 MCG (2000 UT) tablet Take 2,000 Units by mouth daily.   zinc gluconate 50 MG tablet Take 50 mg by mouth daily.        Allergies: No Known Allergies  Past Medical History, Surgical history, Social history, and Family History were reviewed and updated.  Review of Systems: Review of Systems  Constitutional: Negative.   HENT: Negative.    Eyes: Negative.   Respiratory: Negative.    Cardiovascular: Negative.   Gastrointestinal: Negative.   Genitourinary: Negative.   Musculoskeletal:  Positive for joint pain and myalgias.  Skin: Negative.   Neurological: Negative.   Endo/Heme/Allergies: Negative.   Psychiatric/Behavioral: Negative.      Physical Exam:  height is 6' (1.829 m) and weight is 271 lb 2 oz (123 kg). His oral temperature is 98.1 F (36.7 C). His blood pressure is 127/82 and his pulse is 70. His respiration is 18 and oxygen saturation is 97%.   Wt Readings from Last 3 Encounters:  04/19/22 271 lb 2 oz (123 kg)  03/23/22 270 lb (122.5 kg)  03/04/22 264 lb (119.7 kg)    Physical Exam Vitals reviewed.  HENT:     Head: Normocephalic and atraumatic.  Eyes:     Pupils: Pupils are equal, round, and reactive to light.  Cardiovascular:     Rate and Rhythm: Normal rate and regular rhythm.     Heart sounds: Normal heart sounds.  Pulmonary:     Effort: Pulmonary effort is normal.     Breath sounds: Normal breath sounds.  Abdominal:     General: Bowel sounds are normal.     Palpations: Abdomen is soft.  Musculoskeletal:        General: No tenderness or deformity. Normal range of motion.     Cervical back: Normal range of motion.  Lymphadenopathy:     Cervical: No cervical adenopathy.  Skin:    General: Skin is warm and dry.     Findings: No erythema or rash.  Neurological:     Mental Status: He is alert and oriented to person, place, and time.  Psychiatric:        Behavior: Behavior normal.        Thought Content: Thought content  normal.        Judgment: Judgment normal.     Lab Results  Component Value Date   WBC 3.8 (L) 04/19/2022   HGB 12.1 (L) 04/19/2022   HCT 35.2 (L) 04/19/2022   MCV 100.9 (H) 04/19/2022   PLT 219 04/19/2022   Lab Results  Component Value Date   FERRITIN 1,424 (H) 03/23/2022   IRON 105 03/23/2022   TIBC 270 03/23/2022   UIBC 165 03/23/2022   IRONPCTSAT 39 03/23/2022     Lab Results  Component Value Date   RETICCTPCT 1.6 11/27/2019   RBC 3.49 (L) 04/19/2022   No results found for: "KPAFRELGTCHN", "LAMBDASER", "[facility]" Lab Results  Component Value Date   IGGSERUM 1,046 03/25/2020   IGMSERUM 64 03/25/2020   Lab Results  Component Value Date   ALBUMINELP 4.3 03/25/2020   A1GS 0.3 03/25/2020   A2GS 0.7 03/25/2020   BETS 0.4 03/25/2020   BETA2SER 0.3 03/25/2020   GAMS 0.9 03/25/2020   SPEI  03/25/2020     Comment:     Normal Serum Protein Electrophoresis Pattern. No abnormal protein bands (M-protein) detected.      Chemistry      Component Value Date/Time   NA 140 04/19/2022 0809   NA 138 06/04/2020 1003   NA 145 06/06/2017 1316   NA 141 10/16/2015 1334   K 3.5 04/19/2022 0809   K 4.1 06/06/2017 1316   K 3.9 10/16/2015 1334   CL 106 04/19/2022 0809   CL 108 06/06/2017 1316   CO2 24 04/19/2022 0809   CO2 26 06/06/2017 1316   CO2 21 (L) 10/16/2015 1334   BUN 19 04/19/2022 0809   BUN 17 06/04/2020 1003   BUN 19 06/06/2017 1316   BUN 20.0 10/16/2015 1334   CREATININE 1.02 04/19/2022 0809   CREATININE 0.79 11/03/2021 1528   CREATININE 1.0 10/16/2015 1334      Component Value Date/Time   CALCIUM 9.4 04/19/2022 0809   CALCIUM 9.4 06/06/2017 1316   CALCIUM 9.7 10/16/2015 1334   ALKPHOS 80 04/19/2022 0809   ALKPHOS 78 06/06/2017 1316   ALKPHOS 62 10/16/2015 1334   AST 20 04/19/2022 0809   AST 16 10/16/2015 1334   ALT 32 04/19/2022 0809   ALT 27 06/06/2017 1316   ALT 11 10/16/2015 1334   BILITOT 0.4 04/19/2022 0809   BILITOT 0.49 10/16/2015 1334        Impression and Plan: Mr. Eckardt is a very nice  55 yo caucasian gentleman with history of stage IIIB melanoma of the right hallux that was subungual with one microscopic positive inguinal lymph node. He completed 3 cycles of Yervoy but stopped due to side effects.   He has had multiple recurrences.  He has had radiation for these.  He has had treatment for these.  He ultimately has had surgery to try to resect out what has recurred in his abdomen.  He is having a lot of pain and that was a motivation behind the surgery.   I will give him a fourth cycle of chemotherapy.  After this, then we will repeat his scans and see how everything looks.    His last scans were done at [facility].  Somehow, we will going to have to get the scans done out there so we can have comparisons.    R , MD 10/31/20238:54 AM   

## 2022-04-19 NOTE — Patient Instructions (Signed)
Dacarbazine Injection What is this medication? DACARBAZINE (da KAR ba zeen) treats skin cancer and lymphoma. It works by slowing down the growth of cancer cells. This medicine may be used for other purposes; ask your health care provider or pharmacist if you have questions. COMMON BRAND NAME(S): DTIC-Dome What should I tell my care team before I take this medication? They need to know if you have any of these conditions: Infection, such as chickenpox, cold sores, herpes Kidney disease Liver disease Low blood cell levels, such as low white cells, platelets, or red blood cells Recent radiation therapy An unusual or allergic reaction to dacarbazine, other medications, foods, dyes, or preservatives Pregnant or trying to get pregnant Breast-feeding How should I use this medication? This medication is given as an injected into a vein. It is given by your care team in a hospital or clinic setting. Talk to your care team about the use of this medication in children. While it may be prescribed for selected conditions, precautions do apply. Overdosage: If you think you have taken too much of this medicine contact a poison control center or emergency room at once. NOTE: This medicine is only for you. Do not share this medicine with others. What if I miss a dose? Keep appointments for follow-up doses. It is important not to miss your dose. Call your care team if you are unable to keep an appointment. What may interact with this medication? Do not take this medication with any of the following: Live virus vaccines This medication may also interact with the following: Medications to increase blood counts, such as filgrastim, pegfilgrastim, sargramostim This list may not describe all possible interactions. Give your health care provider a list of all the medicines, herbs, non-prescription drugs, or dietary supplements you use. Also tell them if you smoke, drink alcohol, or use illegal drugs. Some items  may interact with your medicine. What should I watch for while using this medication? Your condition will be monitored carefully while you are receiving this medication. You may need blood work done while taking this medication. This medication may make you feel generally unwell. This is not uncommon as chemotherapy can affect healthy cells as well as cancer cells. Report any side effects. Continue your course of treatment even though you feel ill unless your care team tells you to stop. This medication may increase your risk of getting an infection. Call your care team for advice if you get a fever, chills, sore throat, or other symptoms of a cold or flu. Do not treat yourself. Try to avoid being around people who are sick. This medication may increase your risk to bruise or bleed. Call your care team if you notice any unusual bleeding. Talk to your care team about your risk of cancer. You may be more at risk for certain types of cancers if you take this medication. Talk to your care team if you wish to become pregnant or think you might be pregnant. This medication can cause serious birth defects if taken during pregnancy. A reliable form of contraception is recommended while taking this medication. Talk to your care team about effective forms of contraception. Do not breastfeed while taking this medication. What side effects may I notice from receiving this medication? Side effects that you should report to your care team as soon as possible: Allergic reactions--skin rash, itching, hives, swelling of the face, lips, tongue, or throat Infection--fever, chills, cough, sore throat, wounds that don't heal, pain or trouble when passing urine, general  feeling of discomfort or being unwell Liver injury--right upper belly pain, loss of appetite, nausea, light-colored stool, dark yellow or brown urine, yellowing skin or eyes, unusual weakness or fatigue Low red blood cell level--unusual weakness or fatigue,  dizziness, headache, trouble breathing Painful swelling, warmth, or redness of the skin, blisters or sores at the infusion site Unusual bruising or bleeding Side effects that usually do not require medical attention (report to your care team if they continue or are bothersome): Hair loss Loss of appetite Nausea Vomiting This list may not describe all possible side effects. Call your doctor for medical advice about side effects. You may report side effects to FDA at 1-800-FDA-1088. Where should I keep my medication? This medication is given in a hospital or clinic. It will not be stored at home. NOTE: This sheet is a summary. It may not cover all possible information. If you have questions about this medicine, talk to your doctor, pharmacist, or health care provider.  2023 Elsevier/Gold Standard (2021-10-01 00:00:00)

## 2022-04-20 ENCOUNTER — Inpatient Hospital Stay: Payer: PPO | Attending: Hematology & Oncology

## 2022-04-20 VITALS — BP 128/73 | HR 80 | Temp 97.9°F | Resp 18

## 2022-04-20 DIAGNOSIS — Z5111 Encounter for antineoplastic chemotherapy: Secondary | ICD-10-CM | POA: Insufficient documentation

## 2022-04-20 DIAGNOSIS — C4371 Malignant melanoma of right lower limb, including hip: Secondary | ICD-10-CM | POA: Diagnosis present

## 2022-04-20 DIAGNOSIS — Z79899 Other long term (current) drug therapy: Secondary | ICD-10-CM | POA: Insufficient documentation

## 2022-04-20 DIAGNOSIS — C7952 Secondary malignant neoplasm of bone marrow: Secondary | ICD-10-CM | POA: Insufficient documentation

## 2022-04-20 DIAGNOSIS — Z923 Personal history of irradiation: Secondary | ICD-10-CM | POA: Insufficient documentation

## 2022-04-20 DIAGNOSIS — R5383 Other fatigue: Secondary | ICD-10-CM | POA: Insufficient documentation

## 2022-04-20 DIAGNOSIS — C778 Secondary and unspecified malignant neoplasm of lymph nodes of multiple regions: Secondary | ICD-10-CM | POA: Insufficient documentation

## 2022-04-20 DIAGNOSIS — C7A8 Other malignant neuroendocrine tumors: Secondary | ICD-10-CM

## 2022-04-20 DIAGNOSIS — D701 Agranulocytosis secondary to cancer chemotherapy: Secondary | ICD-10-CM | POA: Diagnosis not present

## 2022-04-20 MED ORDER — PEGFILGRASTIM-CBQV 6 MG/0.6ML ~~LOC~~ SOSY
6.0000 mg | PREFILLED_SYRINGE | Freq: Once | SUBCUTANEOUS | Status: AC
  Administered 2022-04-20: 6 mg via SUBCUTANEOUS
  Filled 2022-04-20: qty 0.6

## 2022-04-20 NOTE — Patient Instructions (Signed)
[facility]  Discharge Instructions: Thank you for choosing [facility] to provide your oncology and hematology care.   If you have a lab appointment with the [facility], please go directly to the [facility] and check in at the registration area.  Wear comfortable clothing and clothing appropriate for easy access to any Portacath or PICC line.   We strive to give you quality time with your provider. You may need to reschedule your appointment if you arrive late (15 or more minutes).  Arriving late affects you and other patients whose appointments are after yours.  Also, if you miss three or more appointments without notifying the office, you may be dismissed from the clinic at the provider's discretion.      For prescription refill requests, have your pharmacy contact our office and allow 72 hours for refills to be completed.    Today you received the following chemotherapy and/or immunotherapy agents udenyca     To help prevent nausea and vomiting after your treatment, we encourage you to take your nausea medication as directed.  BELOW ARE SYMPTOMS THAT SHOULD BE REPORTED IMMEDIATELY: *FEVER GREATER THAN 100.4 F (38 C) OR HIGHER *CHILLS OR SWEATING *NAUSEA AND VOMITING THAT IS NOT CONTROLLED WITH YOUR NAUSEA MEDICATION *UNUSUAL SHORTNESS OF BREATH *UNUSUAL BRUISING OR BLEEDING *URINARY PROBLEMS (pain or burning when urinating, or frequent urination) *BOWEL PROBLEMS (unusual diarrhea, constipation, pain near the anus) TENDERNESS IN MOUTH AND THROAT WITH OR WITHOUT PRESENCE OF ULCERS (sore throat, sores in mouth, or a toothache) UNUSUAL RASH, SWELLING OR PAIN  UNUSUAL VAGINAL DISCHARGE OR ITCHING   Items with * indicate a potential emergency and should be followed up as soon as possible or go to the Emergency Department if any problems should occur.  Please show the CHEMOTHERAPY ALERT CARD or IMMUNOTHERAPY ALERT CARD at check-in to the  Emergency Department and triage nurse. Should you have questions after your visit or need to cancel or reschedule your appointment, please contact [facility]  [phone] and follow the prompts.  Office hours are 8:00 a.m. to 4:30 p.m. Monday - Friday. Please note that voicemails left after 4:00 p.m. may not be returned until the following business day.  We are closed weekends and major holidays. You have access to a nurse at all times for urgent questions. Please call the main number to the clinic [phone] and follow the prompts.  For any non-urgent questions, you may also contact your provider using MyChart. We now offer e-Visits for anyone 73 and older to request care online for non-urgent symptoms. For details visit [email or web address].   Also download the MyChart app! Go to the app store, search "MyChart", open the app, select [facility], and log in with your MyChart username and password.  Masks are optional in the cancer centers. If you would like for your care team to wear a mask while they are taking care of you, please let them know. You may have one support person who is at least 56 years old accompany you for your appointments.

## 2022-04-22 ENCOUNTER — Other Ambulatory Visit: Payer: Self-pay

## 2022-04-26 ENCOUNTER — Other Ambulatory Visit: Payer: Self-pay

## 2022-05-16 ENCOUNTER — Encounter: Payer: Self-pay | Admitting: Hematology & Oncology

## 2022-05-16 ENCOUNTER — Telehealth: Payer: Self-pay | Admitting: *Deleted

## 2022-05-16 ENCOUNTER — Other Ambulatory Visit: Payer: Self-pay | Admitting: *Deleted

## 2022-05-16 NOTE — Telephone Encounter (Signed)
Received a call from wife Hoyle Sauer stating that James Byrd is in [facility] with pneumonia and wanted Dr Marin Olp to know.  Cancelled patients appointment for this Wednesday per Carolyns request.  Hoyle Sauer requests Dr Marin Olp to read PET and pathology that was done at [facility]./  Dr Marin Olp notified of above

## 2022-05-18 ENCOUNTER — Other Ambulatory Visit: Payer: PPO

## 2022-05-18 ENCOUNTER — Ambulatory Visit: Payer: PPO | Admitting: Hematology & Oncology

## 2022-05-18 ENCOUNTER — Ambulatory Visit: Payer: PPO

## 2022-05-20 ENCOUNTER — Telehealth: Payer: Self-pay | Admitting: *Deleted

## 2022-05-20 ENCOUNTER — Encounter: Payer: Self-pay | Admitting: Hematology & Oncology

## 2022-05-20 NOTE — Telephone Encounter (Signed)
RETURNED PATIENT'S PHONE CALL, SPOKE WITH PATIENT. ?

## 2022-05-24 ENCOUNTER — Other Ambulatory Visit: Payer: Self-pay

## 2022-05-24 NOTE — Progress Notes (Signed)
Histology and Location of Primary Cancer:    Sites of Visceral and Bony Metastatic Disease:      Past/Anticipated chemotherapy by medical oncology, if any:    Pain on a scale of 0-10 is: 3 patient has pain to right shoulder and pelvis.     Ambulatory status? Walker? Wheelchair?: Ambulatory Patient reports  bilateral knee edema.   SAFETY ISSUES: Prior radiation? Yes left and right 01/25/22-02/07/22, ABD, internal iliac pelvis 09/15/21-09/28/21, Ieft leg 12/29/20-01/08/21, right lower  limb 12/27/18-01/29/19 Pacemaker/ICD? no Possible current pregnancy? no Is the patient on methotrexate? no  Current Complaints / other details:  Patient currently taking Augmentin 875mg  BID for pneumonia.   BP (!) 156/88 (BP Location: Left Arm, Patient Position: Sitting, Cuff Size: Large)   Pulse 65   Temp 98.1 F (36.7 C)   Resp 20   Ht 6' (1.829 m)   Wt 281 lb 3.2 oz (127.6 kg)   SpO2 96%   BMI 38.14 kg/m

## 2022-05-24 NOTE — Progress Notes (Signed)
Radiation Oncology         [phone] ________________________________  Outpatient Re-Consultation  Name: James Byrd MRN: [id number]  Date: 05/25/2022  DOB: 1965/12/03  QQ:[id number], Sharon Mt, MD  [name], MD   REFERRING PHYSICIAN: [name], MD  DIAGNOSIS: There were no encounter diagnoses.  Recent PET with evidence of metastatic melanoma disease progression including a new hypermetabolic osseous lesion in the right scapular body, an increase in size of multiple solid retroperitoneal masses, and an enlarged hypermetabolic left para-aortic lymph node   Metastatic melanoma; metastatic pulmonary nodules in the RUL and LLL    The primary encounter diagnosis was Malignant melanoma of right great toe Stage IIIB ([id number]). Diagnoses of Neuroendocrine carcinoma of pancreas ([place]) and Metastatic melanoma to lymph node [facility]) were also pertinent to this visit.   Stage IIIB ([id number]) subungual melanoma of the right hallux; nodal recurrence in the right inguinal nodes; BRAF with right common iliac node recurrence; progression of melanoma-lymph node metastasis 02/2021   Neuroendocrine carcinoma of the pancreas  Interval since last radiation treatment: 3 months and 15 days  Intent: Curative  Radiation Treatment Dates: 01/25/2022 through 02/07/2022 Site Technique Total Dose (Gy) Dose per Fx (Gy) Completed Fx Beam Energies  Lung, Right: Lung_R IMRT 50/50 5 10/10 6XFFF  Lung, Left: Lung_L IMRT 50/50 5 10/10 10XFFF    HISTORY OF PRESENT ILLNESS::James Byrd is a 56 y.o. male who is accompanied by ***. he is seen as a courtesy of Dr. Marin Olp for re-evaluation and an opinion concerning radiation therapy as part of management for his recent progression of metastatic melanoma. The patient was last seen by radiation oncology on 02/07/22 for his final radiation treatment to metastatic disease in both lungs. He did not present for his 1 month post-RT follow-up as scheduled.    Since that time, he continued to meet with Dr. Marin Olp for dacarbazine infusions and follow-up visits. Cycle 2 of dacarbazine was held on 02/16/22 for one week secondary to low white cell counts, and he was given a dose on Neupogen (Neulasta) with cycle 2 the following week to better manage this.   Following 4 cycles of dacarbazine, the patient presented for a restaging PET scan on 05/10/22 which unfortunately demonstrated findings concerning for progressive neoplastic disease, evidenced by: a new hypermetabolic osseous lesion in the right scapular body measuring 3.0 x 2.7 cm with an SUV max of 5.6, and an increase in size of multiple solid retroperitoneal masses, including a solid mass interposed between the third portion of the duodenum and IVC measuring 3.6 x 3.2 cm with an SUV max of 4.7 (previously 1.3 x 1 cm with SUV max 2.5), as well as an enlarged hypermetabolic left para-aortic lymph node measuring 4.1 x 3.8 cm with an SUV max of 7.8. PET scan also showed: residual hypermetabolic changes in the right hemipelvis favoring resolving infectious complications (however residual malignancy cannot be entirely excluded), and ground glass and consolidative opacities in the right lung possibly secondary to interval treatment versus residual metastatic disease. (Of note: the previously noted left lower lobe pulmonary nodule was not clearly depicted on this study).    Following his PET scan, the patient presented to Dr. Crisoforo [facility] surgical oncology) for evaluation of right shoulder pain. Physical exam performed noted a 3 cm raised firm area. FNA of the right shoulder mass showed no malignant cells identified.   The patient was unable to follow-up with Dr. Antonieta Pert office last week given that he was hospitalized at [facility] with pneumonia.    PAST MEDICAL HISTORY:  Past Medical History:  Diagnosis Date   Adrenal insufficiency ([place])    Anxiety 96/22/2979   Complication of anesthesia    SLO TO  AWAKEN MANY 8 TO 9 YRS AGO, NO ISSUES SINCE   Goals of care, counseling/discussion 12/17/2018   Gout    History of kidney stones    History of radiation therapy    Bilateral lung- 01/25/22-02/10/22- Dr. Gery Pray   HLD (hyperlipidemia)    Malignant melanoma of right great toe ([place]) 11/14/2014   Neuroendocrine carcinoma of pancreas ([place]) 05/22/2019   Paroxysmal supraventricular tachycardia 05/24/2012   Described in the treadmill report; details are pending    PE (pulmonary thromboembolism) ([place]) 08/2019   Rheumatoid arthritis ([place])    Sleep apnea    MILD NO CPAP NEEDED    PAST SURGICAL HISTORY: Past Surgical History:  Procedure Laterality Date   CHOLECYSTECTOMY     ESOPHAGOGASTRODUODENOSCOPY (EGD) WITH PROPOFOL N/A 05/13/2019   Procedure: ESOPHAGOGASTRODUODENOSCOPY (EGD) WITH PROPOFOL;  Surgeon: Arta Silence, MD;  Location: [facility] END[facility]COPY;  Service: Endoscopy;  Laterality: N/A;   EXTRACORPOREAL SHOCK WAVE LITHOTRIPSY  2016   FINE NEEDLE ASPIRATION N/A 05/13/2019   Procedure: FINE NEEDLE ASPIRATION (FNA) LINEAR;  Surgeon: Arta Silence, MD;  Location: [facility];  Service: Endoscopy;  Laterality: N/A;   LYMPH NODE BIOPSY Right 11/06/2018   Procedure: RIGHT INGUINAL LYMPH NODE BIOPSY AND POSSIBLE RIGHT GROIN NODE DISECTION;  Surgeon: Alphonsa Overall, MD;  Location: [facility] ORS;  Service: General;  Laterality: Right;   r toe partial ambutation     ROTATOR CUFF REPAIR Right 2010   3 yrs ago   UPPER ESOPHAGEAL ENDOSCOPIC ULTRASOUND (EUS) N/A 05/13/2019   Procedure: UPPER ESOPHAGEAL ENDOSCOPIC ULTRASOUND (EUS);  Surgeon: Arta Silence, MD;  Location: [facility];  Service: Endoscopy;  Laterality: N/A;    FAMILY HISTORY:  Family History  Adopted: Yes  Problem Relation Age of Onset   Stroke Mother    Breast cancer Mother    COPD Father    Diabetes Father    Dementia Father    Diabetes Sister     SOCIAL HISTORY:  Social History   Tobacco Use   Smoking status: Never     Passive exposure: Never   Smokeless tobacco: Never  Vaping Use   Vaping Use: Never used  Substance Use Topics   Alcohol use: No    Alcohol/week: 0.0 standard drinks of alcohol   Drug use: No    ALLERGIES: No Known Allergies  MEDICATIONS:  Current Outpatient Medications  Medication Sig Dispense Refill   acetaminophen (TYLENOL) 650 MG CR tablet Take 1,300 mg by mouth daily as needed for pain. (Patient not taking: Reported on 04/19/2022)     Ascorbic Acid (VITAMIN C) 1000 MG tablet Take 1,000 mg by mouth daily.     augmented betamethasone dipropionate (DIPROLENE-AF) 0.05 % cream Apply 1 application topically 2 (two) times daily as needed (psoriasis).      BLACK CURRANT SEED OIL PO Take 2 tablets by mouth daily.     Cholecalciferol (VITAMIN D) 2000 units tablet Take 2,000 Units by mouth daily.     cyanocobalamin 2000 MCG tablet Take 2,000 mcg by mouth daily. Vitamin b12     diclofenac sodium (VOLTAREN) 1 % GEL Apply 2 g topically daily as needed (pain).      DULoxetine (CYMBALTA) 20 MG capsule Take 20 mg by mouth daily.     folic acid (FOLVITE) 1 MG tablet  Take 2 tablets (2 mg total) by mouth daily. 180 tablet 2   hydrocortisone (CORTEF) 5 MG tablet Take 1 tablet (5 mg total) by mouth 3 (three) times daily. 270 tablet 2   leflunomide (ARAVA) 10 MG tablet Take 2 tablets (20 mg total) by mouth daily. 180 tablet 2   Multiple Vitamin (MULTIVITAMIN WITH MINERALS) TABS tablet Take 1 tablet by mouth daily.     OLANZapine (ZYPREXA) 5 MG tablet Take 1 tablet (5 mg total) by mouth at bedtime. 30 tablet 4   Omega-3 Fatty Acids (FISH OIL PO) Take by mouth daily.     ondansetron (ZOFRAN-ODT) 8 MG disintegrating tablet Take 1 tablet (8 mg total) by mouth every 8 (eight) hours as needed for nausea or vomiting. 20 tablet 0   OVER THE COUNTER MEDICATION Take 0.5 each by mouth as needed. CBD gummies     oxyCODONE-acetaminophen (PERCOCET) 10-325 MG tablet Take 1 tablet by mouth every 4 (four) hours as needed  for pain. 90 tablet 0   predniSONE (DELTASONE) 10 MG tablet Take 1 tablet (10 mg total) by mouth daily with breakfast. 90 tablet 3   pregabalin (LYRICA) 75 MG capsule Take 1 capsule (75 mg total) by mouth 2 (two) times daily. 180 capsule 3   pyridOXINE (VITAMIN B-6) 100 MG tablet Take 100 mg by mouth daily.     solifenacin (VESICARE) 10 MG tablet Take 1 tablet (10 mg total) by mouth daily. 30 tablet 6   Testosterone 20.25 MG/ACT (1.62%) GEL      zinc gluconate 50 MG tablet Take 50 mg by mouth daily.     No current facility-administered medications for this encounter.   Facility-Administered Medications Ordered in Other Encounters  Medication Dose Route Frequency Provider Last Rate Last Admin   0.9 %  sodium chloride infusion   Intravenous Continuous [name], MD 500 mL/hr at 02/05/15 1510 New Bag at 02/05/15 1510    REVIEW OF SYSTEMS:  A 10+ POINT REVIEW OF SYSTEMS WAS OBTAINED including neurology, dermatology, psychiatry, cardiac, respiratory, lymph, extremities, GI, GU, musculoskeletal, constitutional, reproductive, HEENT. ***   PHYSICAL EXAM:  vitals were not taken for this visit.   General: Alert and oriented, in no acute distress HEENT: Head is normocephalic. Extraocular movements are intact. Oropharynx is clear. Neck: Neck is supple, no palpable cervical or supraclavicular lymphadenopathy. Heart: Regular in rate and rhythm with no murmurs, rubs, or gallops. Chest: Clear to auscultation bilaterally, with no rhonchi, wheezes, or rales. Abdomen: Soft, nontender, nondistended, with no rigidity or guarding. Extremities: No cyanosis or edema. Lymphatics: see Neck Exam Skin: No concerning lesions. Musculoskeletal: symmetric strength and muscle tone throughout. Neurologic: Cranial nerves II through XII are grossly intact. No obvious focalities. Speech is fluent. Coordination is intact. Psychiatric: Judgment and insight are intact. Affect is appropriate. ***  ECOG = ***  0 -  Asymptomatic (Fully active, able to carry on all predisease activities without restriction)  1 - Symptomatic but completely ambulatory (Restricted in physically strenuous activity but ambulatory and able to carry out work of a light or sedentary nature. For example, light housework, office work)  2 - Symptomatic, <50% in bed during the day (Ambulatory and capable of all self care but unable to carry out any work activities. Up and about more than 50% of waking hours)  3 - Symptomatic, >50% in bed, but not bedbound (Capable of only limited self-care, confined to bed or chair 50% or more of waking hours)  4 - Bedbound (Completely  disabled. Cannot carry on any self-care. Totally confined to bed or chair)  5 - Death   Eustace Pen MM, Creech RH, Tormey DC, et al. [phone]). "Toxicity and response criteria of the [facility]". [place] Oncol. 5 (6): 649-55  LABORATORY DATA:  Lab Results  Component Value Date   WBC 3.8 (L) 04/19/2022   HGB 12.1 (L) 04/19/2022   HCT 35.2 (L) 04/19/2022   MCV 100.9 (H) 04/19/2022   PLT 219 04/19/2022   NEUTROABS 2.6 04/19/2022   Lab Results  Component Value Date   NA 140 04/19/2022   K 3.5 04/19/2022   CL 106 04/19/2022   CO2 24 04/19/2022   GLUCOSE 101 (H) 04/19/2022   BUN 19 04/19/2022   CREATININE 1.02 04/19/2022   CALCIUM 9.4 04/19/2022      RADIOGRAPHY: No results found.    IMPRESSION: Recent PET with evidence of metastatic melanoma disease progression including a new hypermetabolic osseous lesion in the right scapular body, an increase in size of multiple solid retroperitoneal masses, and an enlarged hypermetabolic left para-aortic lymph node   ***  Today, I talked to the patient and family about the findings and work-up thus far.  We discussed the natural history of *** and general treatment, highlighting the role of radiotherapy in the management.  We discussed the available radiation techniques, and focused on the details  of logistics and delivery.  We reviewed the anticipated acute and late sequelae associated with radiation in this setting.  The patient was encouraged to ask questions that I answered to the best of my ability. *** A patient consent form was discussed and signed.  We retained a copy for our records.  The patient would like to proceed with radiation and will be scheduled for CT simulation.  PLAN: ***    *** minutes of total time was spent for this patient encounter, including preparation, face-to-face counseling with the patient and coordination of care, physical exam, and documentation of the encounter.   ------------------------------------------------  Blair Promise, PhD, MD  This document serves as a record of services personally performed by Gery Pray, MD. It was created on his behalf by Roney Mans, a trained medical scribe. The creation of this record is based on the scribe's personal observations and the provider's statements to them. This document has been checked and approved by the attending provider.

## 2022-05-25 ENCOUNTER — Encounter: Payer: Self-pay | Admitting: Radiation Oncology

## 2022-05-25 ENCOUNTER — Other Ambulatory Visit: Payer: Self-pay | Admitting: Hematology & Oncology

## 2022-05-25 ENCOUNTER — Ambulatory Visit
Admission: RE | Admit: 2022-05-25 | Discharge: 2022-05-25 | Disposition: A | Discharge status: Home / Self Care | Payer: Self-pay | Source: Ambulatory Visit | Attending: Radiation Oncology | Admitting: Radiation Oncology

## 2022-05-25 ENCOUNTER — Ambulatory Visit
Admission: RE | Admit: 2022-05-25 | Discharge: 2022-05-25 | Disposition: A | Discharge status: Home / Self Care | Payer: PPO | Source: Ambulatory Visit | Attending: Radiation Oncology | Admitting: Radiation Oncology

## 2022-05-25 ENCOUNTER — Other Ambulatory Visit: Payer: Self-pay | Admitting: Radiation Oncology

## 2022-05-25 VITALS — BP 156/88 | HR 65 | Temp 98.1°F | Resp 20 | Ht 72.0 in | Wt 281.2 lb

## 2022-05-25 DIAGNOSIS — C7A8 Other malignant neuroendocrine tumors: Secondary | ICD-10-CM | POA: Diagnosis not present

## 2022-05-25 DIAGNOSIS — C778 Secondary and unspecified malignant neoplasm of lymph nodes of multiple regions: Secondary | ICD-10-CM | POA: Insufficient documentation

## 2022-05-25 DIAGNOSIS — Z7969 Long term (current) use of other immunomodulators and immunosuppressants: Secondary | ICD-10-CM | POA: Insufficient documentation

## 2022-05-25 DIAGNOSIS — Z923 Personal history of irradiation: Secondary | ICD-10-CM | POA: Diagnosis not present

## 2022-05-25 DIAGNOSIS — C4371 Malignant melanoma of right lower limb, including hip: Secondary | ICD-10-CM | POA: Diagnosis not present

## 2022-05-25 DIAGNOSIS — C779 Secondary and unspecified malignant neoplasm of lymph node, unspecified: Secondary | ICD-10-CM

## 2022-05-25 DIAGNOSIS — C7951 Secondary malignant neoplasm of bone: Secondary | ICD-10-CM | POA: Insufficient documentation

## 2022-05-25 DIAGNOSIS — E785 Hyperlipidemia, unspecified: Secondary | ICD-10-CM | POA: Diagnosis not present

## 2022-05-25 DIAGNOSIS — Z803 Family history of malignant neoplasm of breast: Secondary | ICD-10-CM | POA: Insufficient documentation

## 2022-05-25 DIAGNOSIS — E2740 Unspecified adrenocortical insufficiency: Secondary | ICD-10-CM | POA: Insufficient documentation

## 2022-05-25 DIAGNOSIS — Z86711 Personal history of pulmonary embolism: Secondary | ICD-10-CM | POA: Insufficient documentation

## 2022-05-25 DIAGNOSIS — Z79899 Other long term (current) drug therapy: Secondary | ICD-10-CM | POA: Diagnosis not present

## 2022-05-25 DIAGNOSIS — C7801 Secondary malignant neoplasm of right lung: Secondary | ICD-10-CM | POA: Diagnosis not present

## 2022-05-25 DIAGNOSIS — J439 Emphysema, unspecified: Secondary | ICD-10-CM | POA: Diagnosis not present

## 2022-05-25 DIAGNOSIS — Z87442 Personal history of urinary calculi: Secondary | ICD-10-CM | POA: Insufficient documentation

## 2022-05-25 DIAGNOSIS — Z7952 Long term (current) use of systemic steroids: Secondary | ICD-10-CM | POA: Diagnosis not present

## 2022-05-25 DIAGNOSIS — M069 Rheumatoid arthritis, unspecified: Secondary | ICD-10-CM | POA: Insufficient documentation

## 2022-05-25 DIAGNOSIS — G4730 Sleep apnea, unspecified: Secondary | ICD-10-CM | POA: Insufficient documentation

## 2022-05-25 DIAGNOSIS — C7802 Secondary malignant neoplasm of left lung: Secondary | ICD-10-CM | POA: Insufficient documentation

## 2022-05-25 MED ORDER — OXYCODONE-ACETAMINOPHEN 10-325 MG PO TABS: 1.0000 | ORAL_TABLET | ORAL | 0 refills | Status: AC | PRN

## 2022-05-26 ENCOUNTER — Ambulatory Visit
Admission: RE | Admit: 2022-05-26 | Discharge: 2022-05-26 | Disposition: A | Discharge status: Home / Self Care | Payer: PPO | Source: Ambulatory Visit | Attending: Radiation Oncology | Admitting: Radiation Oncology

## 2022-05-26 ENCOUNTER — Other Ambulatory Visit: Payer: Self-pay

## 2022-05-26 DIAGNOSIS — C4371 Malignant melanoma of right lower limb, including hip: Secondary | ICD-10-CM | POA: Diagnosis present

## 2022-05-26 DIAGNOSIS — C779 Secondary and unspecified malignant neoplasm of lymph node, unspecified: Secondary | ICD-10-CM | POA: Diagnosis present

## 2022-05-27 ENCOUNTER — Other Ambulatory Visit: Payer: Self-pay

## 2022-05-28 DIAGNOSIS — C779 Secondary and unspecified malignant neoplasm of lymph node, unspecified: Secondary | ICD-10-CM | POA: Diagnosis not present

## 2022-06-01 ENCOUNTER — Other Ambulatory Visit: Payer: Self-pay

## 2022-06-01 ENCOUNTER — Ambulatory Visit
Admission: RE | Admit: 2022-06-01 | Discharge: 2022-06-01 | Disposition: A | Discharge status: Home / Self Care | Payer: PPO | Source: Ambulatory Visit | Attending: Radiation Oncology | Admitting: Radiation Oncology

## 2022-06-01 DIAGNOSIS — C4371 Malignant melanoma of right lower limb, including hip: Secondary | ICD-10-CM

## 2022-06-01 DIAGNOSIS — C779 Secondary and unspecified malignant neoplasm of lymph node, unspecified: Secondary | ICD-10-CM | POA: Diagnosis not present

## 2022-06-01 LAB — RAD ONC ARIA SESSION SUMMARY
Course Elapsed Days: 0
Plan Fractions Treated to Date: 1
Plan Prescribed Dose Per Fraction: 3 Gy
Plan Total Fractions Prescribed: 10
Plan Total Prescribed Dose: 30 Gy
Reference Point Dosage Given to Date: 3 Gy
Reference Point Session Dosage Given: 3 Gy
Session Number: 1

## 2022-06-02 ENCOUNTER — Encounter: Payer: Self-pay | Admitting: Hematology & Oncology

## 2022-06-02 ENCOUNTER — Other Ambulatory Visit: Payer: Self-pay

## 2022-06-02 ENCOUNTER — Ambulatory Visit
Admission: RE | Admit: 2022-06-02 | Discharge: 2022-06-02 | Disposition: A | Discharge status: Home / Self Care | Payer: PPO | Source: Ambulatory Visit | Attending: Radiation Oncology | Admitting: Radiation Oncology

## 2022-06-02 DIAGNOSIS — C779 Secondary and unspecified malignant neoplasm of lymph node, unspecified: Secondary | ICD-10-CM | POA: Diagnosis not present

## 2022-06-02 DIAGNOSIS — C4371 Malignant melanoma of right lower limb, including hip: Secondary | ICD-10-CM

## 2022-06-02 LAB — RAD ONC ARIA SESSION SUMMARY
Course Elapsed Days: 1
Plan Fractions Treated to Date: 2
Plan Prescribed Dose Per Fraction: 3 Gy
Plan Total Fractions Prescribed: 10
Plan Total Prescribed Dose: 30 Gy
Reference Point Dosage Given to Date: 6 Gy
Reference Point Session Dosage Given: 3 Gy
Session Number: 2

## 2022-06-03 ENCOUNTER — Other Ambulatory Visit: Payer: Self-pay

## 2022-06-03 ENCOUNTER — Ambulatory Visit
Admission: RE | Admit: 2022-06-03 | Discharge: 2022-06-03 | Disposition: A | Discharge status: Home / Self Care | Payer: PPO | Source: Ambulatory Visit | Attending: Radiation Oncology | Admitting: Radiation Oncology

## 2022-06-03 DIAGNOSIS — C779 Secondary and unspecified malignant neoplasm of lymph node, unspecified: Secondary | ICD-10-CM | POA: Diagnosis not present

## 2022-06-03 LAB — RAD ONC ARIA SESSION SUMMARY
Course Elapsed Days: 2
Plan Fractions Treated to Date: 3
Plan Prescribed Dose Per Fraction: 3 Gy
Plan Total Fractions Prescribed: 10
Plan Total Prescribed Dose: 30 Gy
Reference Point Dosage Given to Date: 9 Gy
Reference Point Session Dosage Given: 3 Gy
Session Number: 3

## 2022-06-06 ENCOUNTER — Other Ambulatory Visit: Payer: Self-pay

## 2022-06-06 ENCOUNTER — Ambulatory Visit
Admission: RE | Admit: 2022-06-06 | Discharge: 2022-06-06 | Disposition: A | Discharge status: Home / Self Care | Payer: PPO | Source: Ambulatory Visit | Attending: Radiation Oncology | Admitting: Radiation Oncology

## 2022-06-06 DIAGNOSIS — C779 Secondary and unspecified malignant neoplasm of lymph node, unspecified: Secondary | ICD-10-CM | POA: Diagnosis not present

## 2022-06-06 LAB — RAD ONC ARIA SESSION SUMMARY
Course Elapsed Days: 5
Plan Fractions Treated to Date: 4
Plan Prescribed Dose Per Fraction: 3 Gy
Plan Total Fractions Prescribed: 10
Plan Total Prescribed Dose: 30 Gy
Reference Point Dosage Given to Date: 12 Gy
Reference Point Session Dosage Given: 3 Gy
Session Number: 4

## 2022-06-07 ENCOUNTER — Ambulatory Visit
Admission: RE | Admit: 2022-06-07 | Discharge: 2022-06-07 | Disposition: A | Discharge status: Home / Self Care | Payer: PPO | Source: Ambulatory Visit | Attending: Radiation Oncology | Admitting: Radiation Oncology

## 2022-06-07 ENCOUNTER — Other Ambulatory Visit: Payer: Self-pay

## 2022-06-07 DIAGNOSIS — C779 Secondary and unspecified malignant neoplasm of lymph node, unspecified: Secondary | ICD-10-CM | POA: Diagnosis not present

## 2022-06-07 LAB — RAD ONC ARIA SESSION SUMMARY
Course Elapsed Days: 6
Plan Fractions Treated to Date: 5
Plan Prescribed Dose Per Fraction: 3 Gy
Plan Total Fractions Prescribed: 10
Plan Total Prescribed Dose: 30 Gy
Reference Point Dosage Given to Date: 15 Gy
Reference Point Session Dosage Given: 3 Gy
Session Number: 5

## 2022-06-07 MED ORDER — FENTANYL 25 MCG/HR TD PT72: 1.0000 | MEDICATED_PATCH | TRANSDERMAL | 0 refills | Status: DC

## 2022-06-08 ENCOUNTER — Other Ambulatory Visit: Payer: Self-pay

## 2022-06-08 ENCOUNTER — Ambulatory Visit
Admission: RE | Admit: 2022-06-08 | Discharge: 2022-06-08 | Disposition: A | Discharge status: Home / Self Care | Payer: PPO | Source: Ambulatory Visit | Attending: Radiation Oncology | Admitting: Radiation Oncology

## 2022-06-08 DIAGNOSIS — C779 Secondary and unspecified malignant neoplasm of lymph node, unspecified: Secondary | ICD-10-CM | POA: Diagnosis not present

## 2022-06-08 LAB — RAD ONC ARIA SESSION SUMMARY
Course Elapsed Days: 7
Plan Fractions Treated to Date: 6
Plan Prescribed Dose Per Fraction: 3 Gy
Plan Total Fractions Prescribed: 10
Plan Total Prescribed Dose: 30 Gy
Reference Point Dosage Given to Date: 18 Gy
Reference Point Session Dosage Given: 3 Gy
Session Number: 6

## 2022-06-09 ENCOUNTER — Ambulatory Visit
Admission: RE | Admit: 2022-06-09 | Discharge: 2022-06-09 | Disposition: A | Discharge status: Home / Self Care | Payer: PPO | Source: Ambulatory Visit | Attending: Radiation Oncology | Admitting: Radiation Oncology

## 2022-06-09 ENCOUNTER — Other Ambulatory Visit: Payer: Self-pay

## 2022-06-09 DIAGNOSIS — C779 Secondary and unspecified malignant neoplasm of lymph node, unspecified: Secondary | ICD-10-CM | POA: Diagnosis not present

## 2022-06-09 LAB — RAD ONC ARIA SESSION SUMMARY
Course Elapsed Days: 8
Plan Fractions Treated to Date: 7
Plan Prescribed Dose Per Fraction: 3 Gy
Plan Total Fractions Prescribed: 10
Plan Total Prescribed Dose: 30 Gy
Reference Point Dosage Given to Date: 21 Gy
Reference Point Session Dosage Given: 3 Gy
Session Number: 7

## 2022-06-10 ENCOUNTER — Other Ambulatory Visit: Payer: Self-pay

## 2022-06-10 ENCOUNTER — Ambulatory Visit
Admission: RE | Admit: 2022-06-10 | Discharge: 2022-06-10 | Disposition: A | Discharge status: Home / Self Care | Payer: PPO | Source: Ambulatory Visit | Attending: Radiation Oncology | Admitting: Radiation Oncology

## 2022-06-10 DIAGNOSIS — C779 Secondary and unspecified malignant neoplasm of lymph node, unspecified: Secondary | ICD-10-CM | POA: Diagnosis not present

## 2022-06-10 LAB — RAD ONC ARIA SESSION SUMMARY
Course Elapsed Days: 9
Plan Fractions Treated to Date: 8
Plan Prescribed Dose Per Fraction: 3 Gy
Plan Total Fractions Prescribed: 10
Plan Total Prescribed Dose: 30 Gy
Reference Point Dosage Given to Date: 24 Gy
Reference Point Session Dosage Given: 3 Gy
Session Number: 8

## 2022-06-13 ENCOUNTER — Ambulatory Visit: Payer: PPO

## 2022-06-14 ENCOUNTER — Ambulatory Visit: Payer: PPO

## 2022-06-14 ENCOUNTER — Encounter: Payer: Self-pay | Admitting: Radiation Oncology

## 2022-06-14 ENCOUNTER — Ambulatory Visit: Admission: RE | Admit: 2022-06-14 | Payer: PPO | Source: Ambulatory Visit

## 2022-06-15 ENCOUNTER — Ambulatory Visit: Payer: PPO

## 2022-06-16 ENCOUNTER — Ambulatory Visit: Payer: PPO

## 2022-06-17 ENCOUNTER — Emergency Department (HOSPITAL_COMMUNITY): Payer: PPO

## 2022-06-17 ENCOUNTER — Encounter: Payer: Self-pay | Admitting: Hematology & Oncology

## 2022-06-17 ENCOUNTER — Inpatient Hospital Stay (HOSPITAL_COMMUNITY)
Admission: EM | Admit: 2022-06-17 | Discharge: 2022-06-19 | DRG: 193 | Disposition: A | Discharge status: Home Health | Payer: PPO | Attending: Internal Medicine | Admitting: Internal Medicine

## 2022-06-17 ENCOUNTER — Ambulatory Visit: Payer: PPO

## 2022-06-17 ENCOUNTER — Other Ambulatory Visit: Payer: Self-pay

## 2022-06-17 ENCOUNTER — Encounter (HOSPITAL_COMMUNITY): Payer: Self-pay

## 2022-06-17 DIAGNOSIS — Z803 Family history of malignant neoplasm of breast: Secondary | ICD-10-CM

## 2022-06-17 DIAGNOSIS — F419 Anxiety disorder, unspecified: Secondary | ICD-10-CM | POA: Diagnosis present

## 2022-06-17 DIAGNOSIS — E785 Hyperlipidemia, unspecified: Secondary | ICD-10-CM | POA: Diagnosis present

## 2022-06-17 DIAGNOSIS — Z79899 Other long term (current) drug therapy: Secondary | ICD-10-CM

## 2022-06-17 DIAGNOSIS — G4733 Obstructive sleep apnea (adult) (pediatric): Secondary | ICD-10-CM | POA: Diagnosis present

## 2022-06-17 DIAGNOSIS — I471 Supraventricular tachycardia, unspecified: Secondary | ICD-10-CM | POA: Diagnosis present

## 2022-06-17 DIAGNOSIS — Z1152 Encounter for screening for COVID-19: Secondary | ICD-10-CM

## 2022-06-17 DIAGNOSIS — C779 Secondary and unspecified malignant neoplasm of lymph node, unspecified: Secondary | ICD-10-CM | POA: Diagnosis present

## 2022-06-17 DIAGNOSIS — Z7189 Other specified counseling: Secondary | ICD-10-CM

## 2022-06-17 DIAGNOSIS — D630 Anemia in neoplastic disease: Secondary | ICD-10-CM | POA: Diagnosis present

## 2022-06-17 DIAGNOSIS — Z823 Family history of stroke: Secondary | ICD-10-CM | POA: Diagnosis not present

## 2022-06-17 DIAGNOSIS — N178 Other acute kidney failure: Secondary | ICD-10-CM | POA: Diagnosis present

## 2022-06-17 DIAGNOSIS — J9601 Acute respiratory failure with hypoxia: Secondary | ICD-10-CM | POA: Diagnosis present

## 2022-06-17 DIAGNOSIS — G4730 Sleep apnea, unspecified: Secondary | ICD-10-CM | POA: Diagnosis present

## 2022-06-17 DIAGNOSIS — D696 Thrombocytopenia, unspecified: Secondary | ICD-10-CM | POA: Diagnosis present

## 2022-06-17 DIAGNOSIS — E2740 Unspecified adrenocortical insufficiency: Secondary | ICD-10-CM | POA: Diagnosis present

## 2022-06-17 DIAGNOSIS — E871 Hypo-osmolality and hyponatremia: Secondary | ICD-10-CM | POA: Diagnosis present

## 2022-06-17 DIAGNOSIS — Z87442 Personal history of urinary calculi: Secondary | ICD-10-CM

## 2022-06-17 DIAGNOSIS — E861 Hypovolemia: Secondary | ICD-10-CM | POA: Diagnosis present

## 2022-06-17 DIAGNOSIS — Z825 Family history of asthma and other chronic lower respiratory diseases: Secondary | ICD-10-CM | POA: Diagnosis not present

## 2022-06-17 DIAGNOSIS — N179 Acute kidney failure, unspecified: Secondary | ICD-10-CM

## 2022-06-17 DIAGNOSIS — Z7952 Long term (current) use of systemic steroids: Secondary | ICD-10-CM

## 2022-06-17 DIAGNOSIS — J189 Pneumonia, unspecified organism: Secondary | ICD-10-CM | POA: Diagnosis present

## 2022-06-17 DIAGNOSIS — M069 Rheumatoid arthritis, unspecified: Secondary | ICD-10-CM | POA: Diagnosis present

## 2022-06-17 DIAGNOSIS — C4371 Malignant melanoma of right lower limb, including hip: Secondary | ICD-10-CM | POA: Diagnosis present

## 2022-06-17 DIAGNOSIS — Y95 Nosocomial condition: Secondary | ICD-10-CM | POA: Diagnosis present

## 2022-06-17 DIAGNOSIS — C7A8 Other malignant neuroendocrine tumors: Secondary | ICD-10-CM | POA: Diagnosis present

## 2022-06-17 DIAGNOSIS — J159 Unspecified bacterial pneumonia: Secondary | ICD-10-CM | POA: Diagnosis not present

## 2022-06-17 DIAGNOSIS — Z86711 Personal history of pulmonary embolism: Secondary | ICD-10-CM | POA: Diagnosis not present

## 2022-06-17 DIAGNOSIS — I4719 Other supraventricular tachycardia: Secondary | ICD-10-CM | POA: Diagnosis present

## 2022-06-17 DIAGNOSIS — Z923 Personal history of irradiation: Secondary | ICD-10-CM

## 2022-06-17 DIAGNOSIS — Z9049 Acquired absence of other specified parts of digestive tract: Secondary | ICD-10-CM

## 2022-06-17 DIAGNOSIS — Z515 Encounter for palliative care: Secondary | ICD-10-CM

## 2022-06-17 DIAGNOSIS — M109 Gout, unspecified: Secondary | ICD-10-CM | POA: Diagnosis present

## 2022-06-17 DIAGNOSIS — C439 Malignant melanoma of skin, unspecified: Secondary | ICD-10-CM

## 2022-06-17 DIAGNOSIS — Z8701 Personal history of pneumonia (recurrent): Secondary | ICD-10-CM

## 2022-06-17 DIAGNOSIS — S37012D Minor contusion of left kidney, subsequent encounter: Secondary | ICD-10-CM

## 2022-06-17 LAB — COMPREHENSIVE METABOLIC PANEL
ALT: 24 U/L (ref 0–44)
AST: 22 U/L (ref 15–41)
Albumin: 2.8 g/dL — ABNORMAL LOW (ref 3.5–5.0)
Alkaline Phosphatase: 56 U/L (ref 38–126)
Anion gap: 12 (ref 5–15)
BUN: 16 mg/dL (ref 6–20)
CO2: 22 mmol/L (ref 22–32)
Calcium: 8.5 mg/dL — ABNORMAL LOW (ref 8.9–10.3)
Chloride: 100 mmol/L (ref 98–111)
Creatinine, Ser: 1.54 mg/dL — ABNORMAL HIGH (ref 0.61–1.24)
GFR, Estimated: 53 mL/min — ABNORMAL LOW (ref >60)
Glucose, Bld: 114 mg/dL — ABNORMAL HIGH (ref 70–99)
Potassium: 3.5 mmol/L (ref 3.5–5.1)
Sodium: 134 mmol/L — ABNORMAL LOW (ref 135–145)
Total Bilirubin: 0.6 mg/dL (ref 0.3–1.2)
Total Protein: 6.1 g/dL — ABNORMAL LOW (ref 6.5–8.1)

## 2022-06-17 LAB — HIV ANTIBODY (ROUTINE TESTING W REFLEX): HIV Screen 4th Generation wRfx: NONREACTIVE

## 2022-06-17 LAB — CBC WITH DIFFERENTIAL/PLATELET
Abs Immature Granulocytes: 0.02 10*3/uL (ref 0.00–0.07)
Basophils Absolute: 0 10*3/uL (ref 0.0–0.1)
Basophils Relative: 0 %
Eosinophils Absolute: 0.2 10*3/uL (ref 0.0–0.5)
Eosinophils Relative: 3 %
HCT: 30.2 % — ABNORMAL LOW (ref 39.0–52.0)
Hemoglobin: 9.8 g/dL — ABNORMAL LOW (ref 13.0–17.0)
Immature Granulocytes: 0 %
Lymphocytes Relative: 2 %
Lymphs Abs: 0.1 10*3/uL — ABNORMAL LOW (ref 0.7–4.0)
MCH: 31.8 pg (ref 26.0–34.0)
MCHC: 32.5 g/dL (ref 30.0–36.0)
MCV: 98.1 fL (ref 80.0–100.0)
Monocytes Absolute: 0.5 10*3/uL (ref 0.1–1.0)
Monocytes Relative: 8 %
Neutro Abs: 5.6 10*3/uL (ref 1.7–7.7)
Neutrophils Relative %: 87 %
Platelets: 149 10*3/uL — ABNORMAL LOW (ref 150–400)
RBC: 3.08 MIL/uL — ABNORMAL LOW (ref 4.22–5.81)
RDW: 14.3 % (ref 11.5–15.5)
WBC: 6.5 10*3/uL (ref 4.0–10.5)
nRBC: 0 % (ref 0.0–0.2)

## 2022-06-17 LAB — RESP PANEL BY RT-PCR (RSV, FLU A&B, COVID)  RVPGX2
Influenza A by PCR: NEGATIVE
Influenza B by PCR: NEGATIVE
Resp Syncytial Virus by PCR: NEGATIVE
SARS Coronavirus 2 by RT PCR: NEGATIVE

## 2022-06-17 LAB — MRSA NEXT GEN BY PCR, NASAL: MRSA by PCR Next Gen: NOT DETECTED

## 2022-06-17 LAB — LACTIC ACID, PLASMA: Lactic Acid, Venous: 0.5 mmol/L (ref 0.5–1.9)

## 2022-06-17 LAB — TROPONIN I (HIGH SENSITIVITY)
Troponin I (High Sensitivity): 19 ng/L — ABNORMAL HIGH (ref <18)
Troponin I (High Sensitivity): 13 ng/L (ref <18)

## 2022-06-17 MED ORDER — ONDANSETRON HCL 4 MG PO TABS: 4.0000 mg | ORAL_TABLET | Freq: Four times a day (QID) | ORAL | Status: DC | PRN

## 2022-06-17 MED ORDER — ONDANSETRON HCL 4 MG/2ML IJ SOLN
4.0000 mg | Freq: Four times a day (QID) | INTRAMUSCULAR | Status: DC | PRN
  Administered 2022-06-18 – 2022-06-19 (×2): 4 mg via INTRAVENOUS
  Filled 2022-06-17 (×2): qty 2

## 2022-06-17 MED ORDER — SODIUM CHLORIDE 0.9 % IV SOLN
2.0000 g | Freq: Once | INTRAVENOUS | Status: AC
  Administered 2022-06-17: 2 g via INTRAVENOUS
  Filled 2022-06-17: qty 20

## 2022-06-17 MED ORDER — LACTATED RINGERS IV SOLN: Freq: Once | INTRAVENOUS | Status: AC

## 2022-06-17 MED ORDER — HYDROMORPHONE HCL 1 MG/ML IJ SOLN
1.0000 mg | Freq: Once | INTRAMUSCULAR | Status: AC
  Administered 2022-06-17: 1 mg via INTRAVENOUS
  Filled 2022-06-17: qty 1

## 2022-06-17 MED ORDER — OXYCODONE HCL 5 MG PO TABS
5.0000 mg | ORAL_TABLET | ORAL | Status: DC | PRN
  Administered 2022-06-17 – 2022-06-18 (×3): 5 mg via ORAL
  Filled 2022-06-17 (×4): qty 1

## 2022-06-17 MED ORDER — PREGABALIN 75 MG PO CAPS
75.0000 mg | ORAL_CAPSULE | Freq: Two times a day (BID) | ORAL | Status: DC
  Administered 2022-06-17 – 2022-06-19 (×4): 75 mg via ORAL
  Filled 2022-06-17 (×4): qty 1

## 2022-06-17 MED ORDER — ENOXAPARIN SODIUM 60 MG/0.6ML IJ SOSY
60.0000 mg | PREFILLED_SYRINGE | INTRAMUSCULAR | Status: DC
  Filled 2022-06-17: qty 0.6

## 2022-06-17 MED ORDER — OXYCODONE-ACETAMINOPHEN 5-325 MG PO TABS
1.0000 | ORAL_TABLET | ORAL | Status: DC | PRN
  Administered 2022-06-18 – 2022-06-19 (×2): 1 via ORAL
  Filled 2022-06-17 (×2): qty 1

## 2022-06-17 MED ORDER — SENNOSIDES-DOCUSATE SODIUM 8.6-50 MG PO TABS: 1.0000 | ORAL_TABLET | Freq: Every evening | ORAL | Status: DC | PRN

## 2022-06-17 MED ORDER — IOHEXOL 350 MG/ML SOLN
50.0000 mL | Freq: Once | INTRAVENOUS | Status: AC | PRN
  Administered 2022-06-17: 50 mL via INTRAVENOUS

## 2022-06-17 MED ORDER — LACTATED RINGERS IV BOLUS
1000.0000 mL | Freq: Once | INTRAVENOUS | Status: AC
  Administered 2022-06-17: 1000 mL via INTRAVENOUS

## 2022-06-17 MED ORDER — FENTANYL 25 MCG/HR TD PT72
1.0000 | MEDICATED_PATCH | TRANSDERMAL | Status: DC
  Administered 2022-06-18: 1 via TRANSDERMAL
  Filled 2022-06-17: qty 1

## 2022-06-17 MED ORDER — SODIUM CHLORIDE 0.9 % IV SOLN
500.0000 mg | Freq: Once | INTRAVENOUS | Status: AC
  Administered 2022-06-17: 500 mg via INTRAVENOUS
  Filled 2022-06-17: qty 5

## 2022-06-17 MED ORDER — OXYCODONE-ACETAMINOPHEN 10-325 MG PO TABS: 1.0000 | ORAL_TABLET | ORAL | Status: DC | PRN

## 2022-06-17 MED ORDER — SODIUM CHLORIDE 0.9 % IV SOLN
3.0000 g | Freq: Four times a day (QID) | INTRAVENOUS | Status: DC
  Administered 2022-06-17 – 2022-06-19 (×8): 3 g via INTRAVENOUS
  Filled 2022-06-17 (×9): qty 8

## 2022-06-17 MED ORDER — HYDROCORTISONE SOD SUC (PF) 100 MG IJ SOLR
100.0000 mg | Freq: Once | INTRAMUSCULAR | Status: AC
  Administered 2022-06-17: 100 mg via INTRAVENOUS
  Filled 2022-06-17: qty 2

## 2022-06-17 NOTE — Progress Notes (Signed)
Pharmacy Antibiotic Note  James Byrd is a 56 y.o. male admitted on 06/17/2022 presenting with abdominal pain and SOB.  Pharmacy has been consulted for Unasyn dosing.  Plan: Unasyn 3g IV every 6 hours Monitor renal function, clinical progression and LOT  Height: 6' (182.9 cm) Weight: 127 kg (280 lb) IBW/kg (Calculated) : 77.6  Temp (24hrs), Avg:98.9 F (37.2 C), Min:98.2 F (36.8 C), Max:99.5 F (37.5 C)  Recent Labs  Lab 06/17/22 1347 06/17/22 1512  WBC 6.5  --   CREATININE 1.54*  --   LATICACIDVEN  --  0.5    Estimated Creatinine Clearance: 73.8 mL/min (A) (by C-G formula based on SCr of 1.54 mg/dL (H)).    No Known Allergies  Bertis Ruddy, PharmD, [facility] Clinical Pharmacist ED Pharmacist Phone # [phone] 06/17/2022 4:48 PM

## 2022-06-17 NOTE — ED Provider Triage Note (Addendum)
Emergency Medicine Provider Triage Evaluation Note  Remiel Corti , a 56 y.o. male  was evaluated in triage.  Pt complains of shortness of breath.  Patient was just discharged from [facility] yesterday where he was diagnosed with a renal hematoma on the left side.  States that he had radiation in this area in July.  States he was also diagnosed with pneumonia has been taking antibiotics for this.  Woke up this morning and was short of breath.  Denies any chest pain.  States that his left side is also bothering him but is not as bad as when he was admitted.  Chart reviewed and patient required blood transfusion for renal hematoma  Review of Systems  Positive:  Negative:   Physical Exam  BP 113/67 (BP Location: Right Arm)   Pulse 96   Temp 99.5 F (37.5 C)   Resp 18   Ht 6' (1.829 m)   Wt 127 kg   SpO2 90%   BMI 37.97 kg/m  Gen:   Awake, no distress   Resp:  Normal effort  MSK:   Moves extremities without difficulty  Other:    Medical Decision Making  Medically screening exam initiated at 1:42 PM.  Appropriate orders placed.  Kyaire Gruenewald was informed that the remainder of the evaluation will be completed by another provider, this initial triage assessment does not replace that evaluation, and the importance of remaining in the ED until their evaluation is complete.  Patient now on 6 L of oxygen via nasal cannula satting 90%.  Will need next available room. Charge nurse aware   Bud Face, PA-C 06/17/22 1345    Bud Face, PA-C 06/17/22 1348

## 2022-06-17 NOTE — ED Notes (Signed)
Pt alert, NAD, calm, interactive. IVF and zithromax infusing. Remains on 6L [place]. Pt to CT. VSS.

## 2022-06-17 NOTE — ED Notes (Signed)
EDP into see 

## 2022-06-17 NOTE — ED Triage Notes (Signed)
Pt [facility] from home c/o left upper abdominal pain. Pt was discharged from a hospital yesterday and told he had a bleed around his kidney. Today pain is back and pt endorses SHOB. Pt was 82% on RA. Pt was placed on 6L to maintain a saturation above 90. Pt is also c/o generalized weakness.

## 2022-06-17 NOTE — H&P (Signed)
History and Physical    James Byrd GEX:[id number] DOB: 02-22-1966 DOA: 06/17/2022  PCP: Emmaline Kluver, MD   Chief Complaint: Dyspnea with exertion  HPI: James Byrd is a 56 y.o. male with recent hospitalization to outside facility found to have community-acquired pneumonia acute hypoxia as well as incidentally noted left kidney hematoma.  Patient discharged home to complete antibiotic course and was discharged from their facility without supplemental oxygen requirements.  Over the past 48 hours patient had worsening dyspnea with exertion, fever as high as 103 and presented to our facility for further evaluation and treatment.  Patient has known medical history of malignant melanoma of the right toe, neuroendocrine carcinoma of the pancreas, history of PE 2021 sleep apnea, rheumatoid arthritis as well as nephrolithiasis and gout.  His chronic medical conditions appear to be stable per our discussion.  In the ED patient underwent CTA to confirm no acute PE given his history and recent hospitalization which was negative for filling defect but remarkable for diffuse airspace disease consistent with bacterial pneumonia.  Review of Systems: As per HPI dyspnea with exertion fever chills.  Denies nausea vomiting diarrhea constipation headache or chest pain..   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 intake, apparently has had poor appetite over the past few days  Chronic anemia of chronic disease Thrombocytopenia, chronic -Follow repeat labs, Dr. Marin Olp following patient for malignancies as below, appear to be stable and at baseline  History of malignant melanoma -Dr. Marin Olp following History of PE -CTA here negative, no indication for anticoagulation Sleep apnea -resume  CPAP as tolerated Rheumatoid arthritis - Continue outpatient analgesia  History of nephrolithiasis - Currently asymptomatic History of gout - Currently asymptomatic  DVT prophylaxis: Lovenox  Code Status: Full  Family Communication: None present  Status is: Inpt  Dispo: The patient is from: Home              Anticipated d/c is to: TBD              Anticipated d/c date is: 48-72h pending clinical course              Patient currently NOT medically stable for discharge requiring supplemental oxygen and IV antibiotics  Consultants:  None  Procedures:  None   Past Medical History:  Diagnosis Date   Adrenal insufficiency ([place])    Anxiety 66/44/0347   Complication of anesthesia    SLO TO AWAKEN MANY 8 TO 9 YRS AGO, NO ISSUES SINCE   Goals of care, counseling/discussion 12/17/2018   Gout    History of kidney stones    History of radiation therapy    Bilateral lung- 01/25/22-02/10/22- Dr. Gery Pray   HLD (hyperlipidemia)    Malignant melanoma of right great toe ([place]) 11/14/2014  Neuroendocrine carcinoma of pancreas ([place]) 05/22/2019   Paroxysmal supraventricular tachycardia 05/24/2012   Described in the treadmill report; details are pending    PE (pulmonary thromboembolism) ([place]) 08/2019   Rheumatoid arthritis ([place])    Sleep apnea    MILD NO CPAP NEEDED     Past Surgical History:  Procedure Laterality Date   CHOLECYSTECTOMY     ESOPHAGOGASTRODUODENOSCOPY (EGD) WITH PROPOFOL N/A 05/13/2019   Procedure: ESOPHAGOGASTRODUODENOSCOPY (EGD) WITH PROPOFOL;  Surgeon: Arta Silence, MD;  Location: [facility];  Service: Endoscopy;  Laterality: N/A;   EXTRACORPOREAL SHOCK WAVE LITHOTRIPSY  2016   FINE NEEDLE ASPIRATION N/A 05/13/2019   Procedure: FINE NEEDLE ASPIRATION (FNA) LINEAR;  Surgeon: Arta Silence, MD;  Location: [facility] ENDOSCOPY;  Service: Endoscopy;  Laterality: N/A;   LYMPH NODE BIOPSY Right 11/06/2018   Procedure: RIGHT INGUINAL LYMPH NODE BIOPSY AND POSSIBLE RIGHT GROIN NODE DISECTION;  Surgeon: Alphonsa Overall, MD;  Location: [facility] ORS;  Service: General;  Laterality: Right;   r toe partial ambutation     ROTATOR CUFF REPAIR Right 2010   3 yrs ago   UPPER ESOPHAGEAL ENDOSCOPIC ULTRASOUND (EUS) N/A 05/13/2019   Procedure: UPPER ESOPHAGEAL ENDOSCOPIC ULTRASOUND (EUS);  Surgeon: Arta Silence, MD;  Location: [facility];  Service: Endoscopy;  Laterality: N/A;     reports that he has never smoked. He has never been exposed to tobacco smoke. He has never used smokeless tobacco. He reports that he does not drink alcohol and does not use drugs.  No Known Allergies  Family History  Adopted: Yes  Problem Relation Age of Onset   Stroke Mother    Breast cancer Mother    COPD Father    Diabetes Father    Dementia Father    Diabetes Sister     Prior to Admission medications   Medication Sig Start Date End Date Taking? Authorizing Provider  acetaminophen (TYLENOL) 650 MG CR tablet Take 1,300 mg by mouth daily as needed for pain.    [provider]  Ascorbic Acid (VITAMIN C) 1000 MG tablet Take 1,000 mg by mouth daily.    [provider]  augmented betamethasone dipropionate (DIPROLENE-AF) 0.05 % cream Apply 1 application topically 2 (two) times daily as needed (psoriasis).  09/29/15   [provider]  BLACK  CURRANT SEED OIL PO Take 2 tablets by mouth daily. Patient not taking: Reported on 05/25/2022    [provider]  Cholecalciferol (VITAMIN D) 2000 units tablet Take 2,000 Units by mouth daily.    [provider]  cyanocobalamin 2000 MCG tablet Take 2,000 mcg by mouth daily. Vitamin b12    [provider]  diclofenac sodium (VOLTAREN) 1 % GEL Apply 2 g topically daily as needed (pain).  10/23/18   [provider]  DULoxetine (CYMBALTA) 20 MG capsule Take 20 mg by mouth daily.    [provider]  fentaNYL (DURAGESIC) 25 MCG/HR Place 1 patch onto the skin every 3 (three) days. 06/07/22 07/07/22  [name], MD  folic acid (FOLVITE) 1 MG tablet Take 2 tablets (2 mg total) by mouth daily. 02/10/22   [name], MD  hydrocortisone (CORTEF) 5 MG tablet Take 1 tablet (5 mg total) by mouth 3 (three) times daily. 02/10/22   [name], MD  leflunomide (ARAVA) 10 MG tablet Take 2 tablets (20 mg total) by mouth daily. 02/10/22   [name], MD  Multiple Vitamin (MULTIVITAMIN WITH MINERALS) TABS tablet Take 1 tablet by mouth daily.  [provider]  OLANZapine (ZYPREXA) 5 MG tablet Take 1 tablet (5 mg total) by mouth at bedtime. 04/19/22   [name], MD  Omega-3 Fatty Acids (FISH OIL PO) Take by mouth daily.    [provider]  ondansetron (ZOFRAN-ODT) 8 MG disintegrating tablet Take 1 tablet (8 mg total) by mouth every 8 (eight) hours as needed for nausea or vomiting. 07/16/21   Loeffler, Adora Fridge, PA-C  OVER THE COUNTER MEDICATION Take 0.5 each by mouth as needed. CBD gummies    [provider]  oxyCODONE-acetaminophen (PERCOCET) 10-325 MG tablet Take 1 tablet by mouth every 4 (four) hours as needed for pain. 05/25/22   [name], MD  predniSONE (DELTASONE) 10 MG tablet Take 1 tablet (10 mg total) by mouth daily with breakfast. 08/12/21   [name], MD  pregabalin (LYRICA) 75 MG capsule Take 1 capsule  (75 mg total) by mouth 2 (two) times daily. 02/10/22   [name], MD  pyridOXINE (VITAMIN B-6) 100 MG tablet Take 100 mg by mouth daily.    [provider]  solifenacin (VESICARE) 10 MG tablet Take 1 tablet (10 mg total) by mouth daily. 03/23/22   [name], MD  Testosterone 20.25 MG/ACT (1.62%) GEL  06/16/20   [provider]  zinc gluconate 50 MG tablet Take 50 mg by mouth daily.    [provider]  prochlorperazine (COMPAZINE) 10 MG tablet Take 1 tablet (10 mg total) by mouth every 6 (six) hours as needed (Nausea or vomiting). 01/26/22 02/16/22  [name], MD    Physical Exam: Vitals:   06/17/22 1545 06/17/22 1600 06/17/22 1615 06/17/22 1630  BP: 122/80 121/75 121/86 (!) 158/79  Pulse: 81 89 81 92  Resp: (!) '30  16 17  '$ Temp:      TempSrc:      SpO2: 94% 94% 95% 95%  Weight:      Height:        Constitutional: NAD, calm, comfortable Vitals:   06/17/22 1545 06/17/22 1600 06/17/22 1615 06/17/22 1630  BP: 122/80 121/75 121/86 (!) 158/79  Pulse: 81 89 81 92  Resp: (!) '30  16 17  '$ Temp:      TempSrc:      SpO2: 94% 94% 95% 95%  Weight:      Height:       General:  Pleasantly resting in bed, No acute distress. HEENT:  Normocephalic atraumatic.  Sclerae nonicteric, noninjected.  Extraocular movements intact bilaterally. Neck:  Without mass or deformity.  Trachea is midline. Lungs:  Clear to auscultate bilaterally without rhonchi, wheeze, or rales. Heart:  Regular rate and rhythm.  Without murmurs, rubs, or gallops. Abdomen:  Soft, nontender, nondistended.  Without guarding or rebound. Extremities: Without cyanosis, clubbing, edema, or obvious deformity. Vascular:  Dorsalis pedis and posterior tibial pulses palpable bilaterally. Skin:  Warm and dry, no erythema, no ulcerations.  Labs on Admission: I have personally reviewed following labs and imaging studies  CBC: Recent Labs  Lab 06/17/22 1347  WBC 6.5  NEUTROABS 5.6  HGB 9.8*   HCT 30.2*  MCV 98.1  PLT 119*   Basic Metabolic Panel: Recent Labs  Lab 06/17/22 1347  NA 134*  K 3.5  CL 100  CO2 22  GLUCOSE 114*  BUN 16  CREATININE 1.54*  CALCIUM 8.5*   GFR: Estimated Creatinine Clearance: 73.8 mL/min (A) (by C-G formula based on SCr of 1.54 mg/dL (H)). Liver Function Tests: Recent Labs  Lab 06/17/22 1347  AST 22  ALT 24  ALKPHOS 56  BILITOT 0.6  PROT 6.1*  ALBUMIN 2.8*   No results for input(s): "LIPASE", "AMYLASE" in the last 168 hours. No results for input(s): "AMMONIA" in the last 168 hours. Coagulation Profile: No results for input(s): "INR", "PROTIME" in the last 168 hours. Cardiac Enzymes: No results for input(s): "CKTOTAL", "CKMB", "CKMBINDEX", "TROPONINI" in the last 168 hours. BNP (last 3 results) No results for input(s): "PROBNP" in the last 8760 hours. HbA1C: No results for input(s): "HGBA1C" in the last 72 hours. CBG: No results for input(s): "GLUCAP" in the last 168 hours. Lipid Profile: No results for input(s): "CHOL", "HDL", "LDLCALC", "TRIG", "CHOLHDL", "LDLDIRECT" in the last 72 hours. Thyroid Function Tests: No results for input(s): "TSH", "T4TOTAL", "FREET4", "T3FREE", "THYROIDAB" in the last 72 hours. Anemia Panel: No results for input(s): "VITAMINB12", "FOLATE", "FERRITIN", "TIBC", "IRON", "RETICCTPCT" in the last 72 hours. Urine analysis:    Component Value Date/Time   COLORURINE YELLOW 07/16/2021 [place] 07/16/2021 1413   LABSPEC 1.019 07/16/2021 1413   PHURINE 5.0 07/16/2021 1413   GLUCOSEU NEGATIVE 07/16/2021 1413   HGBUR NEGATIVE 07/16/2021 [place] 07/16/2021 1413   KETONESUR NEGATIVE 07/16/2021 1413   PROTEINUR NEGATIVE 07/16/2021 1413   UROBILINOGEN 0.2 03/06/2015 0928   NITRITE NEGATIVE 07/16/2021 1413   LEUKOCYTESUR NEGATIVE 07/16/2021 1413    Radiological Exams on Admission: CT Angio Chest PE W/Cm &/Or Wo Cm  Result Date: 06/17/2022 CLINICAL DATA:  Short  of breath, concern for pulmonary embolism. Cough and fever. Metastatic melanoma. * Tracking Code: BO * EXAM: CT ANGIOGRAPHY CHEST WITH CONTRAST TECHNIQUE: Multidetector CT imaging of the chest was performed using the standard protocol during bolus administration of intravenous contrast. Multiplanar CT image reconstructions and MIPs were obtained to evaluate the vascular anatomy. RADIATION DOSE REDUCTION: This exam was performed according to the departmental dose-optimization program which includes automated exposure control, adjustment of the mA and/or kV according to patient size and/or use of iterative reconstruction technique. CONTRAST:  44m OMNIPAQUE IOHEXOL 350 MG/ML SOLN COMPARISON:  None Available. FINDINGS: Cardiovascular: No tubular filling defects within the pulmonary suggest acute pulmonary embolism. There is motion artifact. Additionally there is poor arterial contrast bolus within the pulmonary arteries. Mediastinum/Nodes: No axillary or supraclavicular adenopathy. No mediastinal or hilar adenopathy. No pericardial fluid. Esophagus normal. Lungs/Pleura: Peripheral consolidation in the RIGHT upper lobe. This consolidation is masslike measuring 4.3 by 3.1 cm (image 45/series 6). Similar peripheral consolidation in the superior aspect of the RIGHT lower lobe measuring 4.4 cm (image 56/6. There is bibasilar medial consolidation/atelectasis. Small effusions. Upper Abdomen: Limited view of the liver, kidneys, pancreas are unremarkable. Normal adrenal glands. Musculoskeletal: No aggressive osseous lesion. Review of the MIP images confirms the above findings. IMPRESSION: 1. Masslike consolidation in the RIGHT upper lobe and RIGHT lower lobe is most suggestive of multifocal pneumonia. Recommend follow-up CT to exclude melanoma recurrence. 2. No evidence acute pulmonary embolism on suboptimal exam. 3. Bibasilar dense medial atelectasis and small effusions. Electronically Signed   By: SSuzy BouchardM.D.   On:  06/17/2022 16:08   DG Chest 2 View  Result Date: 06/17/2022 CLINICAL DATA:  Shortness of breath. EXAM: CHEST - 2 VIEW COMPARISON:  Chest radiograph and chest CTA 05/14/2022 FINDINGS: The cardiomediastinal silhouette is unchanged with normal heart size. Lung volumes are low. Masslike opacity in the right upper lobe appears to have mildly decreased in craniocaudal extent compared to the prior radiograph.  Mild streaky right basilar opacity favors atelectasis. There is persistent asymmetric opacity in the left lung base with a possible small left pleural effusion. No pneumothorax is identified. No acute osseous abnormality is seen. IMPRESSION: 1. Apparent mild interval contraction of masslike opacity in the right upper lobe which was favored to be infectious/inflammatory on prior CT. 2. Persistent left basilar atelectasis or consolidation with possible small left pleural effusion. Electronically Signed   By: Logan Bores M.D.   On: 06/17/2022 14:38    EKG: Independently reviewed. Sinus tachycardia   Little Ishikawa DO [facility] For contact please use secure messenger on Epic  If 7PM-7AM, please contact night-coverage located on [email or web address]   06/17/2022, 4:49 PM

## 2022-06-17 NOTE — ED Notes (Signed)
Back from CT

## 2022-06-17 NOTE — ED Notes (Addendum)
Admitting MD at [facility]. SPO2 decreased after dilaudid. [place] remains at 6LPM

## 2022-06-17 NOTE — Consult Note (Signed)
James Byrd is well-known to me.  James Byrd is a very nice 56 year old white male.  James Byrd has recurrent melanoma.  James Byrd essentially has had 4 or 5 recurrences.  James Byrd has had surgeries.  We recently had treated James Byrd with dacarbazine.  James Byrd does not have a actionable mutation.  James Byrd has BRAF negative.  James Byrd has been on immunotherapy which James Byrd had tolerated but unfortunately had progressed.  James Byrd is really not done well since Thanksgiving.  James Byrd has had problems with "pneumonia."  James Byrd was down in [facility] for Christmas.  James Byrd began to have problems.  James Byrd had a lot of flank pain and had to go to the hospital down in [facility].  I think this was on Christmas Eve.  James Byrd had a CT scan of the abdomen which showed a perinephric hematoma.  I think James Byrd also had pneumonia.  James Byrd is hospitalized for several days.  James Byrd was given a blood transfusion.  James Byrd was discharged yesterday.  James Byrd was post be discharged on oxygen.  James Byrd has not had oxygen.  James Byrd subsequently decompensated.  James Byrd was then brought to the emergency room at [facility].  His labs show white count 6.5.  Hemoglobin 9.8.  Platelet count 149,000.  His electrolytes showed BUN of 16 creatinine 1.54.  Calcium 8.5 with an albumin of 2.8.  James Byrd had a CT angiogram of the chest.  This did not show any pulmonary embolism.  James Byrd had masslike consolidation of the right upper lobe and right lower lobe which was felt to be suggestive of multifocal pneumonia.  Of note, James Byrd has had 8/10 radiation treatments to his right shoulder area for recurrent disease.  James Byrd does have abdominal pain.  James Byrd has a umbilical hernia.  There is no mention of any perinephric hematoma on the CT scan.  This may not have gone down far enough to see the hematoma.  James Byrd does look a little pale.  James Byrd is wearing supplemental oxygen.  James Byrd is having some abdominal discomfort over on the left flank.  Is been no obvious bleeding.  There is a little bit of swelling in the left ankle.  James Byrd has seen one of the melanoma specialist at  [facility].  They recommended giving James Byrd TRAMETINIB ([place]) because his tumor does have a DPH3-RAF1 fusion and a RAF1 amplification.  There have been some reports of using MEK  inhibitors in these patients.  James Byrd has had no fever.  There is been a little bit of diarrhea I think.  James Byrd has had no rashes.  Overall, I would have said that his performance status is probably ECOG 2.  His vital signs are temperature of 98.2.  Pulse 92.  Blood pressure 158/79.  Weight is 280 pounds.  His head neck exam shows some pale conjunctiva.  Extraocular muscles are intact.  James Byrd has no oral lesions.  There is no adenopathy in the neck.  Lungs are relatively clear bilaterally.  James Byrd sounds like James Byrd has good air movement bilaterally.  I hear no wheezes.  Cardiac exam regular rate and rhythm.  James Byrd has maybe a 1/6 systolic ejection murmur.  Abdomen is soft.  James Byrd has an umbilical hernia.  This is reducible.  James Byrd has some tenderness on the left side of his abdomen.  There is no fullness.  James Byrd has decent bowel sounds.  There is no palpable liver or spleen tip.  Extremities show some trace edema in his legs bilaterally.  James Byrd has decent range of motion of his upper and  lower extremities.  James Byrd has good strength.  Neurological exam is nonfocal.   James Byrd is a nice 56 year old white male.  James Byrd has had multiple recurrences of melanoma.  We have been through multiple treatments.  James Byrd now comes in with what extremity appears to be pneumonia.  I suppose this might also be his melanoma.  Again is hard to really say right now.  What is interesting is that with his PET scan that James Byrd had done back in November.  James Byrd had significant hilar adenopathy.  On the CT scan that James Byrd had done today, there is no mention of hilar adenopathy.  It also seemed as if James Byrd had more pulmonary disease.  With multiple pulmonary nodules.  I am not sure why James Byrd would have had this perinephric hematoma.  Again has not been on any anticoagulants.  I probably would forego any  additional radiation to the shoulder.  James Byrd seems to have had a good response.  His hemoglobin level really bothers me.  Have to believe that this is going to go down with some hydration.  James Byrd is on IV antibiotics for this pneumonia.  This is very complicated.  James Byrd is going to be admitted.  I am sure that James Byrd will need intensive antibiotic therapy.  I will probably would also use nebulizers on James Byrd.  We will have to see what his labs look like in the morning.  It would not surprise me if needs to be transfused.  We will follow James Byrd along.  I know that James Byrd will get fantastic care from everybody in the ER.      James Napoleon, MD  [place] 6:9

## 2022-06-17 NOTE — ED Notes (Signed)
Onc Dr. Marin Olp at [facility]

## 2022-06-17 NOTE — ED Provider Notes (Addendum)
[facility] EMERGENCY DEPARTMENT Provider Note   CSN: [id number] Arrival date & time: 06/17/22  1319     History  Chief Complaint  Patient presents with   Cough   Shortness of Breath    James Byrd is a 56 y.o. male.  Pt with hx metastatic melanoma, c/o increased wob, persistent fever and cough. States recently d/c from [facility] with pneumonia and left kidney hematoma. States d/c'd home on abx therapy, but o2 sats are low, and fever to 103 has persisted. No chest pain. No new leg pain or swelling. No sore throat. No new or worsening abd pain. No faintness. States compliant w home meds.   The history is provided by the patient, medical records, the EMS personnel and a friend.  Abdominal Pain Associated symptoms: cough, fever and shortness of breath   Associated symptoms: no chest pain, no diarrhea, no dysuria, no sore throat and no vomiting        Home Medications Prior to Admission medications   Medication Sig Start Date End Date Taking? Authorizing Provider  acetaminophen (TYLENOL) 650 MG CR tablet Take 1,300 mg by mouth daily as needed for pain.    [provider]  Ascorbic Acid (VITAMIN C) 1000 MG tablet Take 1,000 mg by mouth daily.    [provider]  augmented betamethasone dipropionate (DIPROLENE-AF) 0.05 % cream Apply 1 application topically 2 (two) times daily as needed (psoriasis).  09/29/15   [provider]  BLACK CURRANT SEED OIL PO Take 2 tablets by mouth daily. Patient not taking: Reported on 05/25/2022    [provider]  Cholecalciferol (VITAMIN D) 2000 units tablet Take 2,000 Units by mouth daily.    [provider]  cyanocobalamin 2000 MCG tablet Take 2,000 mcg by mouth daily. Vitamin b12    [provider]  diclofenac sodium (VOLTAREN) 1 % GEL Apply 2 g topically daily as needed (pain).  10/23/18   [provider]  DULoxetine (CYMBALTA) 20 MG capsule Take 20 mg by  mouth daily.    [provider]  fentaNYL (DURAGESIC) 25 MCG/HR Place 1 patch onto the skin every 3 (three) days. 06/07/22 07/07/22  [name], MD  folic acid (FOLVITE) 1 MG tablet Take 2 tablets (2 mg total) by mouth daily. 02/10/22   [name], MD  hydrocortisone (CORTEF) 5 MG tablet Take 1 tablet (5 mg total) by mouth 3 (three) times daily. 02/10/22   [name], MD  leflunomide (ARAVA) 10 MG tablet Take 2 tablets (20 mg total) by mouth daily. 02/10/22   [name], MD  Multiple Vitamin (MULTIVITAMIN WITH MINERALS) TABS tablet Take 1 tablet by mouth daily.    [provider]  OLANZapine (ZYPREXA) 5 MG tablet Take 1 tablet (5 mg total) by mouth at bedtime. 04/19/22   [name], MD  Omega-3 Fatty Acids (FISH OIL PO) Take by mouth daily.    [provider]  ondansetron (ZOFRAN-ODT) 8 MG disintegrating tablet Take 1 tablet (8 mg total) by mouth every 8 (eight) hours as needed for nausea or vomiting. 07/16/21   Loeffler, Adora Fridge, PA-C  OVER THE COUNTER MEDICATION Take 0.5 each by mouth as needed. CBD gummies    [provider]  oxyCODONE-acetaminophen (PERCOCET) 10-325 MG tablet Take 1 tablet by mouth every 4 (four) hours as needed for pain. 05/25/22   [name], MD  predniSONE (DELTASONE) 10 MG tablet Take 1 tablet (10 mg total)  by mouth daily with breakfast. 08/12/21   [name], MD  pregabalin (LYRICA) 75 MG capsule Take 1 capsule (75 mg total) by mouth 2 (two) times daily. 02/10/22   [name], MD  pyridOXINE (VITAMIN B-6) 100 MG tablet Take 100 mg by mouth daily.    [provider]  solifenacin (VESICARE) 10 MG tablet Take 1 tablet (10 mg total) by mouth daily. 03/23/22   [name], MD  Testosterone 20.25 MG/ACT (1.62%) GEL  06/16/20   [provider]  zinc gluconate 50 MG tablet Take 50 mg by mouth daily.    [provider]  prochlorperazine (COMPAZINE) 10 MG tablet Take 1 tablet  (10 mg total) by mouth every 6 (six) hours as needed (Nausea or vomiting). 01/26/22 02/16/22  [name], MD      Allergies    Patient has no known allergies.    Review of Systems   Review of Systems  Constitutional:  Positive for fever.  HENT:  Negative for sore throat.   Eyes:  Negative for redness.  Respiratory:  Positive for cough and shortness of breath.   Cardiovascular:  Negative for chest pain.  Gastrointestinal:  Positive for abdominal pain. Negative for diarrhea and vomiting.  Genitourinary:  Negative for dysuria and flank pain.  Musculoskeletal:  Negative for back pain and neck pain.  Skin:  Negative for rash.  Neurological:  Negative for headaches.  Hematological:  Does not bruise/bleed easily.  Psychiatric/Behavioral:  Negative for confusion.     Physical Exam Updated Vital Signs BP 121/75   Pulse 89   Temp 98.2 F (36.8 C) (Oral)   Resp (!) 30   Ht 1.829 m (6')   Wt 127 kg   SpO2 94%   BMI 37.97 kg/m  Physical Exam Vitals and nursing note reviewed.  Constitutional:      Appearance: Normal appearance. He is well-developed.  HENT:     Head: Atraumatic.     Nose: Nose normal.     Mouth/Throat:     Mouth: Mucous membranes are moist.     Pharynx: Oropharynx is clear.  Eyes:     General: No scleral icterus.    Conjunctiva/sclera: Conjunctivae normal.  Neck:     Trachea: No tracheal deviation.  Cardiovascular:     Rate and Rhythm: Normal rate and regular rhythm.     Pulses: Normal pulses.     Heart sounds: Normal heart sounds. No murmur heard.    No friction rub. No gallop.  Pulmonary:     Effort: Pulmonary effort is normal. No accessory muscle usage or respiratory distress.     Breath sounds: Rales present.  Abdominal:     General: Bowel sounds are normal. There is no distension.     Palpations: Abdomen is soft.     Tenderness: There is abdominal tenderness.     Comments: Mild left abd tenderness. Soft, non tender, reducible umbilical hernia.    Genitourinary:    Comments: No cva tenderness. Musculoskeletal:        General: No swelling.     Cervical back: Normal range of motion and neck supple. No rigidity.     Right lower leg: No edema.     Left lower leg: No edema.  Skin:    General: Skin is warm and dry.     Findings: No rash.  Neurological:     Mental Status: He is alert.     Comments: Alert, speech clear.   Psychiatric:  Mood and Affect: Mood normal.     ED Results / Procedures / Treatments   Labs (all labs ordered are listed, but only abnormal results are displayed) Results for orders placed or performed during the hospital encounter of 06/17/22  CBC with Differential  Result Value Ref Range   WBC 6.5 4.0 - 10.5 K/uL   RBC 3.08 (L) 4.22 - 5.81 MIL/uL   Hemoglobin 9.8 (L) 13.0 - 17.0 g/dL   HCT 30.2 (L) 39.0 - 52.0 %   MCV 98.1 80.0 - 100.0 fL   MCH 31.8 26.0 - 34.0 pg   MCHC 32.5 30.0 - 36.0 g/dL   RDW 14.3 11.5 - 15.5 %   Platelets 149 (L) 150 - 400 K/uL   nRBC 0.0 0.0 - 0.2 %   Neutrophils Relative % 87 %   Neutro Abs 5.6 1.7 - 7.7 K/uL   Lymphocytes Relative 2 %   Lymphs Abs 0.1 (L) 0.7 - 4.0 K/uL   Monocytes Relative 8 %   Monocytes Absolute 0.5 0.1 - 1.0 K/uL   Eosinophils Relative 3 %   Eosinophils Absolute 0.2 0.0 - 0.5 K/uL   Basophils Relative 0 %   Basophils Absolute 0.0 0.0 - 0.1 K/uL   Immature Granulocytes 0 %   Abs Immature Granulocytes 0.02 0.00 - 0.07 K/uL  Comprehensive metabolic panel  Result Value Ref Range   Sodium 134 (L) 135 - 145 mmol/L   Potassium 3.5 3.5 - 5.1 mmol/L   Chloride 100 98 - 111 mmol/L   CO2 22 22 - 32 mmol/L   Glucose, Bld 114 (H) 70 - 99 mg/dL   BUN 16 6 - 20 mg/dL   Creatinine, Ser 1.54 (H) 0.61 - 1.24 mg/dL   Calcium 8.5 (L) 8.9 - 10.3 mg/dL   Total Protein 6.1 (L) 6.5 - 8.1 g/dL   Albumin 2.8 (L) 3.5 - 5.0 g/dL   AST 22 15 - 41 U/L   ALT 24 0 - 44 U/L   Alkaline Phosphatase 56 38 - 126 U/L   Total Bilirubin 0.6 0.3 - 1.2 mg/dL   GFR,  Estimated 53 (L) >60 mL/min   Anion gap 12 5 - 15  Lactic acid, plasma  Result Value Ref Range   Lactic Acid, Venous 0.5 0.5 - 1.9 mmol/L  Troponin I (High Sensitivity)  Result Value Ref Range   Troponin I (High Sensitivity) 19 (H) <18 ng/L  Troponin I (High Sensitivity)  Result Value Ref Range   Troponin I (High Sensitivity) 13 <18 ng/L   CT Angio Chest PE W/Cm &/Or Wo Cm  Result Date: 06/17/2022 CLINICAL DATA:  Short of breath, concern for pulmonary embolism. Cough and fever. Metastatic melanoma. * Tracking Code: BO * EXAM: CT ANGIOGRAPHY CHEST WITH CONTRAST TECHNIQUE: Multidetector CT imaging of the chest was performed using the standard protocol during bolus administration of intravenous contrast. Multiplanar CT image reconstructions and MIPs were obtained to evaluate the vascular anatomy. RADIATION DOSE REDUCTION: This exam was performed according to the departmental dose-optimization program which includes automated exposure control, adjustment of the mA and/or kV according to patient size and/or use of iterative reconstruction technique. CONTRAST:  33m OMNIPAQUE IOHEXOL 350 MG/ML SOLN COMPARISON:  None Available. FINDINGS: Cardiovascular: No tubular filling defects within the pulmonary suggest acute pulmonary embolism. There is motion artifact. Additionally there is poor arterial contrast bolus within the pulmonary arteries. Mediastinum/Nodes: No axillary or supraclavicular adenopathy. No mediastinal or hilar adenopathy. No pericardial fluid. Esophagus normal. Lungs/Pleura: Peripheral consolidation in  the RIGHT upper lobe. This consolidation is masslike measuring 4.3 by 3.1 cm (image 45/series 6). Similar peripheral consolidation in the superior aspect of the RIGHT lower lobe measuring 4.4 cm (image 56/6. There is bibasilar medial consolidation/atelectasis. Small effusions. Upper Abdomen: Limited view of the liver, kidneys, pancreas are unremarkable. Normal adrenal glands. Musculoskeletal: No  aggressive osseous lesion. Review of the MIP images confirms the above findings. IMPRESSION: 1. Masslike consolidation in the RIGHT upper lobe and RIGHT lower lobe is most suggestive of multifocal pneumonia. Recommend follow-up CT to exclude melanoma recurrence. 2. No evidence acute pulmonary embolism on suboptimal exam. 3. Bibasilar dense medial atelectasis and small effusions. Electronically Signed   By: Suzy Bouchard M.D.   On: 06/17/2022 16:08   DG Chest 2 View  Result Date: 06/17/2022 CLINICAL DATA:  Shortness of breath. EXAM: CHEST - 2 VIEW COMPARISON:  Chest radiograph and chest CTA 05/14/2022 FINDINGS: The cardiomediastinal silhouette is unchanged with normal heart size. Lung volumes are low. Masslike opacity in the right upper lobe appears to have mildly decreased in craniocaudal extent compared to the prior radiograph. Mild streaky right basilar opacity favors atelectasis. There is persistent asymmetric opacity in the left lung base with a possible small left pleural effusion. No pneumothorax is identified. No acute osseous abnormality is seen. IMPRESSION: 1. Apparent mild interval contraction of masslike opacity in the right upper lobe which was favored to be infectious/inflammatory on prior CT. 2. Persistent left basilar atelectasis or consolidation with possible small left pleural effusion. Electronically Signed   By: Logan Bores M.D.   On: 06/17/2022 14:38     EKG EKG Interpretation  Date/Time:  Friday June 17 2022 13:40:23 EST Ventricular Rate:  103 PR Interval:  146 QRS Duration: 88 QT Interval:  332 QTC Calculation: 434 R Axis:   16 Text Interpretation: Sinus tachycardia Nonspecific ST abnormality Confirmed by Lajean Saver [phone]) on 06/17/2022 2:36:25 PM  Radiology CT Angio Chest PE W/Cm &/Or Wo Cm  Result Date: 06/17/2022 CLINICAL DATA:  Short of breath, concern for pulmonary embolism. Cough and fever. Metastatic melanoma. * Tracking Code: BO * EXAM: CT ANGIOGRAPHY  CHEST WITH CONTRAST TECHNIQUE: Multidetector CT imaging of the chest was performed using the standard protocol during bolus administration of intravenous contrast. Multiplanar CT image reconstructions and MIPs were obtained to evaluate the vascular anatomy. RADIATION DOSE REDUCTION: This exam was performed according to the departmental dose-optimization program which includes automated exposure control, adjustment of the mA and/or kV according to patient size and/or use of iterative reconstruction technique. CONTRAST:  35m OMNIPAQUE IOHEXOL 350 MG/ML SOLN COMPARISON:  None Available. FINDINGS: Cardiovascular: No tubular filling defects within the pulmonary suggest acute pulmonary embolism. There is motion artifact. Additionally there is poor arterial contrast bolus within the pulmonary arteries. Mediastinum/Nodes: No axillary or supraclavicular adenopathy. No mediastinal or hilar adenopathy. No pericardial fluid. Esophagus normal. Lungs/Pleura: Peripheral consolidation in the RIGHT upper lobe. This consolidation is masslike measuring 4.3 by 3.1 cm (image 45/series 6). Similar peripheral consolidation in the superior aspect of the RIGHT lower lobe measuring 4.4 cm (image 56/6. There is bibasilar medial consolidation/atelectasis. Small effusions. Upper Abdomen: Limited view of the liver, kidneys, pancreas are unremarkable. Normal adrenal glands. Musculoskeletal: No aggressive osseous lesion. Review of the MIP images confirms the above findings. IMPRESSION: 1. Masslike consolidation in the RIGHT upper lobe and RIGHT lower lobe is most suggestive of multifocal pneumonia. Recommend follow-up CT to exclude melanoma recurrence. 2. No evidence acute pulmonary embolism on suboptimal exam. 3. Bibasilar  dense medial atelectasis and small effusions. Electronically Signed   By: Suzy Bouchard M.D.   On: 06/17/2022 16:08   DG Chest 2 View  Result Date: 06/17/2022 CLINICAL DATA:  Shortness of breath. EXAM: CHEST - 2 VIEW  COMPARISON:  Chest radiograph and chest CTA 05/14/2022 FINDINGS: The cardiomediastinal silhouette is unchanged with normal heart size. Lung volumes are low. Masslike opacity in the right upper lobe appears to have mildly decreased in craniocaudal extent compared to the prior radiograph. Mild streaky right basilar opacity favors atelectasis. There is persistent asymmetric opacity in the left lung base with a possible small left pleural effusion. No pneumothorax is identified. No acute osseous abnormality is seen. IMPRESSION: 1. Apparent mild interval contraction of masslike opacity in the right upper lobe which was favored to be infectious/inflammatory on prior CT. 2. Persistent left basilar atelectasis or consolidation with possible small left pleural effusion. Electronically Signed   By: Logan Bores M.D.   On: 06/17/2022 14:38    Procedures Procedures    Medications Ordered in ED Medications  cefTRIAXone (ROCEPHIN) 2 g in sodium chloride 0.9 % 100 mL IVPB (2 g Intravenous New Bag/Given 06/17/22 1604)  azithromycin (ZITHROMAX) 500 mg in sodium chloride 0.9 % 250 mL IVPB (500 mg Intravenous New Bag/Given 06/17/22 1544)  hydrocortisone sodium succinate (SOLU-CORTEF) 100 MG injection 100 mg (100 mg Intravenous Given 06/17/22 1544)  lactated ringers bolus 1,000 mL (1,000 mLs Intravenous New Bag/Given 06/17/22 1543)  iohexol (OMNIPAQUE) 350 MG/ML injection 50 mL (50 mLs Intravenous Contrast Given 06/17/22 1556)    ED Course/ Medical Decision Making/ A&P                           Medical Decision Making Problems Addressed: Acute pneumonia: acute illness or injury with systemic symptoms that poses a threat to life or bodily functions Acute respiratory failure with hypoxia [facility]): acute illness or injury with systemic symptoms that poses a threat to life or bodily functions Hematoma of left kidney, subsequent encounter: acute illness or injury with systemic symptoms that poses a threat to life or  bodily functions Metastatic melanoma ([place]): chronic illness or injury with exacerbation, progression, or side effects of treatment that poses a threat to life or bodily functions  Amount and/or Complexity of Data Reviewed Independent Historian: EMS    Details: hx External Data Reviewed: notes. Labs: ordered. Decision-making details documented in ED Course. Radiology: ordered and independent interpretation performed. Decision-making details documented in ED Course. ECG/medicine tests: ordered and independent interpretation performed. Decision-making details documented in ED Course. Discussion of management or test interpretation with external provider(s): medicine  Risk Prescription drug management. Decision regarding hospitalization.   Iv ns. Continuous pulse ox and cardiac monitoring. Labs ordered/sent. Imaging ordered.   Reviewed nursing notes and prior charts for additional history. External reports reviewed. Additional history from: friend/ems.   Cardiac monitor: sinus rhythm, rate 90.  Cultures sent. Iv abx given.   Labs reviewed/interpreted by me - wbc normal.   Xrays reviewed/interpreted by me - +infiltrate  CTA reviewed/interpreted by me - no PE  Rocephin iv. Zithromax iv.   Plan for admission - medicine consulted for admission.  CRITICAL CARE RE: acute pna w dyspnea/hypoxia/acute resp failure with hypoxia, metastatic melanoma.  Performed by: Mirna Mires Total critical care time: 40 minutes Critical care time was exclusive of separately billable procedures and treating other patients. Critical care was necessary to treat or prevent imminent or life-threatening deterioration. Critical  care was time spent personally by me on the following activities: development of treatment plan with patient and/or surrogate as well as nursing, discussions with consultants, evaluation of patient's response to treatment, examination of patient, obtaining history from patient or  surrogate, ordering and performing treatments and interventions, ordering and review of laboratory studies, ordering and review of radiographic studies, pulse oximetry and re-evaluation of patient's condition.          Final Clinical Impression(s) / ED Diagnoses Final diagnoses:  Acute pneumonia  Acute respiratory failure with hypoxia ([place])  Metastatic melanoma ([place])    Rx / [place] Orders ED Discharge Orders     None          Lajean Saver, MD 06/17/22 1631

## 2022-06-18 ENCOUNTER — Other Ambulatory Visit: Payer: Self-pay

## 2022-06-18 ENCOUNTER — Inpatient Hospital Stay (HOSPITAL_COMMUNITY): Payer: PPO

## 2022-06-18 DIAGNOSIS — J159 Unspecified bacterial pneumonia: Secondary | ICD-10-CM

## 2022-06-18 LAB — COMPREHENSIVE METABOLIC PANEL
ALT: 21 U/L (ref 0–44)
AST: 22 U/L (ref 15–41)
Albumin: 2.4 g/dL — ABNORMAL LOW (ref 3.5–5.0)
Alkaline Phosphatase: 44 U/L (ref 38–126)
Anion gap: 10 (ref 5–15)
BUN: 20 mg/dL (ref 6–20)
CO2: 22 mmol/L (ref 22–32)
Calcium: 8.1 mg/dL — ABNORMAL LOW (ref 8.9–10.3)
Chloride: 104 mmol/L (ref 98–111)
Creatinine, Ser: 1.6 mg/dL — ABNORMAL HIGH (ref 0.61–1.24)
GFR, Estimated: 50 mL/min — ABNORMAL LOW (ref >60)
Glucose, Bld: 201 mg/dL — ABNORMAL HIGH (ref 70–99)
Potassium: 4.2 mmol/L (ref 3.5–5.1)
Sodium: 136 mmol/L (ref 135–145)
Total Bilirubin: 0.7 mg/dL (ref 0.3–1.2)
Total Protein: 5.2 g/dL — ABNORMAL LOW (ref 6.5–8.1)

## 2022-06-18 LAB — CBC WITH DIFFERENTIAL/PLATELET
Abs Immature Granulocytes: 0.02 10*3/uL (ref 0.00–0.07)
Basophils Absolute: 0 10*3/uL (ref 0.0–0.1)
Basophils Relative: 0 %
Eosinophils Absolute: 0 10*3/uL (ref 0.0–0.5)
Eosinophils Relative: 0 %
HCT: 24.6 % — ABNORMAL LOW (ref 39.0–52.0)
Hemoglobin: 8.4 g/dL — ABNORMAL LOW (ref 13.0–17.0)
Immature Granulocytes: 0 %
Lymphocytes Relative: 2 %
Lymphs Abs: 0.1 10*3/uL — ABNORMAL LOW (ref 0.7–4.0)
MCH: 32.3 pg (ref 26.0–34.0)
MCHC: 34.1 g/dL (ref 30.0–36.0)
MCV: 94.6 fL (ref 80.0–100.0)
Monocytes Absolute: 0.4 10*3/uL (ref 0.1–1.0)
Monocytes Relative: 6 %
Neutro Abs: 5.3 10*3/uL (ref 1.7–7.7)
Neutrophils Relative %: 92 %
Platelets: 148 10*3/uL — ABNORMAL LOW (ref 150–400)
RBC: 2.6 MIL/uL — ABNORMAL LOW (ref 4.22–5.81)
RDW: 13.8 % (ref 11.5–15.5)
WBC: 5.8 10*3/uL (ref 4.0–10.5)
nRBC: 0 % (ref 0.0–0.2)

## 2022-06-18 LAB — IRON AND TIBC
Iron: 21 ug/dL — ABNORMAL LOW (ref 45–182)
Saturation Ratios: 12 % — ABNORMAL LOW (ref 17.9–39.5)
TIBC: 171 ug/dL — ABNORMAL LOW (ref 250–450)
UIBC: 150 ug/dL

## 2022-06-18 LAB — HEMOGLOBIN AND HEMATOCRIT, BLOOD
HCT: 28.8 % — ABNORMAL LOW (ref 39.0–52.0)
Hemoglobin: 9.9 g/dL — ABNORMAL LOW (ref 13.0–17.0)

## 2022-06-18 LAB — PREPARE RBC (CROSSMATCH)

## 2022-06-18 LAB — LACTATE DEHYDROGENASE: LDH: 243 U/L — ABNORMAL HIGH (ref 98–192)

## 2022-06-18 LAB — PREALBUMIN: Prealbumin: 10 mg/dL — ABNORMAL LOW (ref 18–38)

## 2022-06-18 MED ORDER — PANTOPRAZOLE SODIUM 40 MG PO TBEC
40.0000 mg | DELAYED_RELEASE_TABLET | Freq: Two times a day (BID) | ORAL | Status: DC
  Administered 2022-06-18 – 2022-06-19 (×3): 40 mg via ORAL
  Filled 2022-06-18 (×3): qty 1

## 2022-06-18 MED ORDER — FUROSEMIDE 10 MG/ML IJ SOLN
20.0000 mg | Freq: Once | INTRAMUSCULAR | Status: AC
  Administered 2022-06-18: 20 mg via INTRAVENOUS
  Filled 2022-06-18: qty 2

## 2022-06-18 MED ORDER — ORAL CARE MOUTH RINSE: 15.0000 mL | OROMUCOSAL | Status: DC | PRN

## 2022-06-18 MED ORDER — LEVALBUTEROL HCL 0.63 MG/3ML IN NEBU: 0.6300 mg | INHALATION_SOLUTION | Freq: Three times a day (TID) | RESPIRATORY_TRACT | Status: DC

## 2022-06-18 MED ORDER — IOHEXOL 350 MG/ML SOLN
60.0000 mL | Freq: Once | INTRAVENOUS | Status: AC | PRN
  Administered 2022-06-18: 60 mL via INTRAVENOUS

## 2022-06-18 MED ORDER — LEVALBUTEROL HCL 0.63 MG/3ML IN NEBU: 0.6300 mg | INHALATION_SOLUTION | Freq: Three times a day (TID) | RESPIRATORY_TRACT | Status: DC | PRN

## 2022-06-18 MED ORDER — SODIUM CHLORIDE 0.9% IV SOLUTION: Freq: Once | INTRAVENOUS | Status: DC

## 2022-06-18 MED ORDER — IOHEXOL 9 MG/ML PO SOLN
500.0000 mL | ORAL | Status: AC
  Administered 2022-06-18 (×2): 500 mL via ORAL

## 2022-06-18 MED ORDER — HYDROCORTISONE 5 MG PO TABS
5.0000 mg | ORAL_TABLET | Freq: Every day | ORAL | Status: DC
  Administered 2022-06-18 – 2022-06-19 (×2): 5 mg via ORAL
  Filled 2022-06-18 (×3): qty 1

## 2022-06-18 NOTE — Plan of Care (Signed)
  Problem: Clinical Measurements: Goal: Respiratory complications will improve Outcome: Progressing Goal: Cardiovascular complication will be avoided Outcome: Progressing   Problem: Activity: Goal: Risk for activity intolerance will decrease Outcome: Progressing   Problem: Elimination: Goal: Will not experience complications related to urinary retention Outcome: Progressing   Problem: Pain Managment: Goal: General experience of comfort will improve Outcome: Not Progressing

## 2022-06-18 NOTE — Progress Notes (Addendum)
PROGRESS NOTE    James Byrd  QQV:[id number] DOB: 11/02/1965 DOA: 06/17/2022 PCP: Venetia Maxon, Sharon Mt, MD   Brief Narrative:  James Byrd is a 56 y.o. male with recent hospitalization to outside facility found to have community-acquired pneumonia acute hypoxia as well as incidentally noted left kidney hematoma.  Patient discharged home to complete antibiotic course and was discharged from their facility without supplemental oxygen requirements.  Over the past 48 hours patient had worsening dyspnea with exertion, fever as high as 103 and presented to our facility for further evaluation and treatment.   Patient has known medical history of malignant melanoma of the right toe, neuroendocrine carcinoma of the pancreas, history of PE 2021 sleep apnea, rheumatoid arthritis as well as nephrolithiasis and gout.  His chronic medical conditions appear to be stable per our discussion.  In the ED patient underwent CTA to confirm no acute PE given his history and recent hospitalization which was negative for filling defect but remarkable for diffuse airspace disease consistent with bacterial pneumonia.  Assessment & Plan:   Principal Problem:   Hospital-acquired bacterial pneumonia Active Problems:   Paroxysmal supraventricular tachycardia   Anxiety   Obstructive sleep apnea   Malignant melanoma of right great toe Stage IIIB ([id number])   Thrombocytopenia (HCC)   Acute respiratory failure with hypoxia (HCC)   AKI (acute kidney injury) ([facility])   Neuroendocrine carcinoma of pancreas (HCC)   Metastatic melanoma to lymph node (HCC)   Acute hypoxic respiratory failure in the setting of recurrent pneumonia, presumed HCAP with failure of antibiotics - Failure of Azithromycin and ceftriaxone - start unasyn - will de-escalate pending cultures/improvement - Continue oxygen supplementation, wean as possible, may need oxygen at discharge given previous hospitalization   AKI without history of  CKD Hypovolemic hyponatremia Left renal hematoma, subacute versus chronic -Continue IV fluids, follow repeat labs -Encourage p.o. intake, apparently has had poor appetite over the past few days -CTA ordered to evaluate known renal hematoma - monitor renal function closely given repeat IV contrast order having chest-CTA of the chest to rule out PE -unclear etiology given no reported trauma, mild thrombocytopenia likely increasing risk of bleed but not so much to explain his spontaneous hematoma -appreciate heme-onc recommendations   Acute on chronic anemia of chronic disease -likely hemodilutional Rule out bleed as above Thrombocytopenia, chronic -Follow repeat labs, Dr. Marin Olp following patient for malignancies as below, who initially ordered 2u PRBC this morning -Lengthy discussion with patient/wife - given mild downtrending Hgb in the setting of aggressive IVF will give one unit today and follow labs, if he does not respond appropriately we can give the second unit. -Unlikely thrombocytopenia is low enough to cause spontaneous hematoma as above, appreciate insight recommendations as well as further testing if required by hematology oncology   History of malignant melanoma -Dr. Marin Olp following History of PE -CTA here negative, no indication for anticoagulation Sleep apnea -not currently on CPAP at home awaiting repeat sleep apnea testing Rheumatoid arthritis - Continue outpatient analgesia  History of nephrolithiasis - Currently asymptomatic History of gout - Currently asymptomatic   DVT prophylaxis: None given above potential bleeding Code Status: Full Family Communication: Wife at bedside  Status is: Inpatient  Dispo: The patient is from: Home              Anticipated d/c is to: Likely home              Anticipated d/c date is: 48 to 72 hours  Patient currently not medically stable for discharge given ongoing symptoms need for blood transfusion and IV antibiotics  oxygen supplementation well above baseline  Consultants:  Oncology  Procedures:  None  Antimicrobials:  Unasyn x 5 days  Subjective: No acute issues or events overnight respiratory status markedly improving although not yet resolved.  Having difficulty tolerating p.o. contrast for CT abdomen but denies overt nausea vomiting diarrhea constipation headache fevers chills or chest pain.  Objective: Vitals:   06/17/22 1900 06/17/22 2121 06/17/22 2329 06/18/22 0317  BP: 125/80 (!) 147/95 124/78 109/64  Pulse: 85  87 83  Resp: '18  15 14  '$ Temp:  99 F (37.2 C) 98.4 F (36.9 C) 97.7 F (36.5 C)  TempSrc:  Oral Oral Oral  SpO2: 92%  92% 94%  Weight:  129.7 kg    Height:  6' (1.829 m)      Intake/Output Summary (Last 24 hours) at 06/18/2022 0733 Last data filed at 06/18/2022 0335 Gross per 24 hour  Intake 1790.94 ml  Output 1570 ml  Net 220.94 ml   Filed Weights   06/17/22 1328 06/17/22 2121  Weight: 127 kg 129.7 kg    Examination:  General:  Pleasantly resting in bed, No acute distress. HEENT:  Normocephalic atraumatic.  Sclerae nonicteric, noninjected.  Extraocular movements intact bilaterally. Neck:  Without mass or deformity.  Trachea is midline. Lungs: Right lower lobe diffuse rhonchi otherwise without wheeze or rales. Heart:  Regular rate and rhythm.  Without murmurs, rubs, or gallops. Abdomen:  Soft, nontender, nondistended.  Without guarding or rebound. Extremities: Without cyanosis, clubbing, edema, or obvious deformity. Vascular:  Dorsalis pedis and posterior tibial pulses palpable bilaterally. Skin:  Warm and dry, no erythema, no ulcerations.  Data Reviewed: I have personally reviewed following labs and imaging studies  CBC: Recent Labs  Lab 06/17/22 1347 06/18/22 0010  WBC 6.5 5.8  NEUTROABS 5.6 5.3  HGB 9.8* 8.4*  HCT 30.2* 24.6*  MCV 98.1 94.6  PLT 149* 109*   Basic Metabolic Panel: Recent Labs  Lab 06/17/22 1347 06/18/22 0010  NA 134* 136   K 3.5 4.2  CL 100 104  CO2 22 22  GLUCOSE 114* 201*  BUN 16 20  CREATININE 1.54* 1.60*  CALCIUM 8.5* 8.1*   GFR: Estimated Creatinine Clearance: 71.8 mL/min (A) (by C-G formula based on SCr of 1.6 mg/dL (H)). Liver Function Tests: Recent Labs  Lab 06/17/22 1347 06/18/22 0010  AST 22 22  ALT 24 21  ALKPHOS 56 44  BILITOT 0.6 0.7  PROT 6.1* 5.2*  ALBUMIN 2.8* 2.4*    Recent Results (from the past 240 hour(s))  Resp panel by RT-PCR (RSV, Flu A&B, Covid) Anterior Nasal Swab     Status: None   Collection Time: 06/17/22  3:12 PM   Specimen: Anterior Nasal Swab  Result Value Ref Range Status   SARS Coronavirus 2 by RT PCR NEGATIVE NEGATIVE Final    Comment: (NOTE) SARS-CoV-2 target nucleic acids are NOT DETECTED.  The SARS-CoV-2 RNA is generally detectable in upper respiratory specimens during the acute phase of infection. The lowest concentration of SARS-CoV-2 viral copies this assay can detect is 138 copies/mL. A negative result does not preclude SARS-Cov-2 infection and should not be used as the sole basis for treatment or other patient management decisions. A negative result may occur with  improper specimen collection/handling, submission of specimen other than nasopharyngeal swab, presence of viral mutation(s) within the areas targeted by this assay, and inadequate number of  viral copies(<138 copies/mL). A negative result must be combined with clinical observations, patient history, and epidemiological information. The expected result is Negative.  Fact Sheet for Patients:  EntrepreneurPulse.com.au  Fact Sheet for Healthcare Providers:  IncredibleEmployment.be  This test is no t yet approved or cleared by the [place] FDA and  has been authorized for detection and/or diagnosis of SARS-CoV-2 by FDA under an Emergency Use Authorization (EUA). This EUA will remain  in effect (meaning this test can be used) for the duration of  the COVID-19 declaration under Section 564(b)(1) of the Act, 21 U.S.C.section 360bbb-3(b)(1), unless the authorization is terminated  or revoked sooner.       Influenza A by PCR NEGATIVE NEGATIVE Final   Influenza B by PCR NEGATIVE NEGATIVE Final    Comment: (NOTE) The Xpert Xpress SARS-CoV-2/FLU/RSV plus assay is intended as an aid in the diagnosis of influenza from Nasopharyngeal swab specimens and should not be used as a sole basis for treatment. Nasal washings and aspirates are unacceptable for Xpert Xpress SARS-CoV-2/FLU/RSV testing.  Fact Sheet for Patients: EntrepreneurPulse.com.au  Fact Sheet for Healthcare Providers: IncredibleEmployment.be  This test is not yet approved or cleared by the [place] FDA and has been authorized for detection and/or diagnosis of SARS-CoV-2 by FDA under an Emergency Use Authorization (EUA). This EUA will remain in effect (meaning this test can be used) for the duration of the COVID-19 declaration under Section 564(b)(1) of the Act, 21 U.S.C. section 360bbb-3(b)(1), unless the authorization is terminated or revoked.     Resp Syncytial Virus by PCR NEGATIVE NEGATIVE Final    Comment: (NOTE) Fact Sheet for Patients: EntrepreneurPulse.com.au  Fact Sheet for Healthcare Providers: IncredibleEmployment.be  This test is not yet approved or cleared by the [place] FDA and has been authorized for detection and/or diagnosis of SARS-CoV-2 by FDA under an Emergency Use Authorization (EUA). This EUA will remain in effect (meaning this test can be used) for the duration of the COVID-19 declaration under Section 564(b)(1) of the Act, 21 U.S.C. section 360bbb-3(b)(1), unless the authorization is terminated or revoked.  Performed at [facility], [place] [address], [place], [place] [address]   Blood culture (routine x 2)     Status: None (Preliminary result)    Collection Time: 06/17/22  3:12 PM   Specimen: BLOOD RIGHT WRIST  Result Value Ref Range Status   Specimen Description BLOOD RIGHT WRIST  Final   Special Requests   Final    BOTTLES DRAWN AEROBIC AND ANAEROBIC Blood Culture adequate volume   Culture   Final    NO GROWTH < 24 HOURS Performed at [facility], [place] [address], [place], [place] [address]    Report Status PENDING  Incomplete  Blood culture (routine x 2)     Status: None (Preliminary result)   Collection Time: 06/17/22  3:25 PM   Specimen: BLOOD  Result Value Ref Range Status   Specimen Description BLOOD RIGHT ANTECUBITAL  Final   Special Requests   Final    BOTTLES DRAWN AEROBIC AND ANAEROBIC Blood Culture adequate volume   Culture   Final    NO GROWTH < 24 HOURS Performed at [facility], [place] [address], [place], [place] [address]    Report Status PENDING  Incomplete  MRSA Next Gen by PCR, Nasal     Status: None   Collection Time: 06/17/22  9:25 PM   Specimen: Nasal Mucosa; Nasal Swab  Result Value Ref Range Status  MRSA by PCR Next Gen NOT DETECTED NOT DETECTED Final    Comment: (NOTE) The GeneXpert MRSA Assay (FDA approved for NASAL specimens only), is one component of a comprehensive MRSA colonization surveillance program. It is not intended to diagnose MRSA infection nor to guide or monitor treatment for MRSA infections. Test performance is not FDA approved in patients less than 16 years old. Performed at [facility], [place] [address], [place], [place] [address]          Radiology Studies: CT Angio Chest PE W/Cm &/Or Wo Cm  Result Date: 06/17/2022 CLINICAL DATA:  Short of breath, concern for pulmonary embolism. Cough and fever. Metastatic melanoma. * Tracking Code: BO * EXAM: CT ANGIOGRAPHY CHEST WITH CONTRAST TECHNIQUE: Multidetector CT imaging of the chest was performed using the standard protocol during bolus administration of intravenous contrast. Multiplanar CT image  reconstructions and MIPs were obtained to evaluate the vascular anatomy. RADIATION DOSE REDUCTION: This exam was performed according to the departmental dose-optimization program which includes automated exposure control, adjustment of the mA and/or kV according to patient size and/or use of iterative reconstruction technique. CONTRAST:  7m OMNIPAQUE IOHEXOL 350 MG/ML SOLN COMPARISON:  None Available. FINDINGS: Cardiovascular: No tubular filling defects within the pulmonary suggest acute pulmonary embolism. There is motion artifact. Additionally there is poor arterial contrast bolus within the pulmonary arteries. Mediastinum/Nodes: No axillary or supraclavicular adenopathy. No mediastinal or hilar adenopathy. No pericardial fluid. Esophagus normal. Lungs/Pleura: Peripheral consolidation in the RIGHT upper lobe. This consolidation is masslike measuring 4.3 by 3.1 cm (image 45/series 6). Similar peripheral consolidation in the superior aspect of the RIGHT lower lobe measuring 4.4 cm (image 56/6. There is bibasilar medial consolidation/atelectasis. Small effusions. Upper Abdomen: Limited view of the liver, kidneys, pancreas are unremarkable. Normal adrenal glands. Musculoskeletal: No aggressive osseous lesion. Review of the MIP images confirms the above findings. IMPRESSION: 1. Masslike consolidation in the RIGHT upper lobe and RIGHT lower lobe is most suggestive of multifocal pneumonia. Recommend follow-up CT to exclude melanoma recurrence. 2. No evidence acute pulmonary embolism on suboptimal exam. 3. Bibasilar dense medial atelectasis and small effusions. Electronically Signed   By: SSuzy BouchardM.D.   On: 06/17/2022 16:08   DG Chest 2 View  Result Date: 06/17/2022 CLINICAL DATA:  Shortness of breath. EXAM: CHEST - 2 VIEW COMPARISON:  Chest radiograph and chest CTA 05/14/2022 FINDINGS: The cardiomediastinal silhouette is unchanged with normal heart size. Lung volumes are low. Masslike opacity in the right  upper lobe appears to have mildly decreased in craniocaudal extent compared to the prior radiograph. Mild streaky right basilar opacity favors atelectasis. There is persistent asymmetric opacity in the left lung base with a possible small left pleural effusion. No pneumothorax is identified. No acute osseous abnormality is seen. IMPRESSION: 1. Apparent mild interval contraction of masslike opacity in the right upper lobe which was favored to be infectious/inflammatory on prior CT. 2. Persistent left basilar atelectasis or consolidation with possible small left pleural effusion. Electronically Signed   By: ALogan BoresM.D.   On: 06/17/2022 14:38    Scheduled Meds:  sodium chloride   Intravenous Once   fentaNYL  1 patch Transdermal Q72H   furosemide  20 mg Intravenous Once   furosemide  20 mg Intravenous Once   hydrocortisone  5 mg Oral Q breakfast   levalbuterol  0.63 mg Nebulization Q8H   pantoprazole  40 mg Oral BID   pregabalin  75 mg Oral BID   Continuous Infusions:  ampicillin-sulbactam (UNASYN) IV 3 g (06/18/22 0549)     LOS: 1 day   Time spent: 17mn  Meg Niemeier C Caroll Weinheimer, DO [facility]  If 7PM-7AM, please contact night-coverage [email or web address]  06/18/2022, 7:33 AM

## 2022-06-18 NOTE — Plan of Care (Signed)
  Problem: Clinical Measurements: Goal: Respiratory complications will improve Outcome: Progressing Goal: Cardiovascular complication will be avoided Outcome: Progressing   Problem: Activity: Goal: Risk for activity intolerance will decrease Outcome: Progressing   Problem: Elimination: Goal: Will not experience complications related to bowel motility Outcome: Progressing Goal: Will not experience complications related to urinary retention Outcome: Progressing   Problem: Pain Managment: Goal: General experience of comfort will improve Outcome: Not Progressing   

## 2022-06-18 NOTE — Progress Notes (Signed)
James Byrd is now on 2C.  As expected, his hemoglobin is dropped.  It is now 8.4.  He really needs to be transfused.  I will give him 2 units of blood.  This left perinephric hematoma needs to be evaluated.  A CT scan would be reasonable.  He is still having some shortness of breath.  He is still on some oxygen.  His BUN is 20 creatinine 1.6.  Calcium is 8.1 and albumin of 2.4.  His prealbumin is only 10.  He is coughing up some purulent material.  He is on broad-spectrum antibiotic coverage.  He has had a little bit of diarrhea.  He still having some pain over on the left flank.  His vital signs are temperature of 97.7.  Pulse 83.  Blood pressure 109/64.  Oxygen saturation is 94%.  His lungs do have some wheezes.  He does have decent air movement.  Cardiac exam regular rate and rhythm.  There are no murmurs.  Abdomen is soft.  Bowel sounds are present.  There is some tenderness to palpation in the left side of the abdomen.  No abdominal masses noted.  There may be a little bit of fullness.  There is no palpable hepatosplenomegaly.  Extremities shows no clubbing, cyanosis or edema.  Neurological exam is nonfocal.  James Byrd has metastatic melanoma.  Looks that he has pneumonia.  He had this hematoma around the left kidney down in [facility].  I will check his iron levels.  I will check an erythropoietin level on him.  Again, the transfusion I think will help quite a bit.  I do think he needs a nebulizer.  He does have some wheezing.  We have to be careful at his rheumatoid arthritis is flareup.  I know he got a dose of Solu-Cortef.  It may not be a bad idea to get him on some hydrocortisone.  I know he will get incredible care in the stepdown unit.  I do appreciate everybody's help.  Lattie Haw, MD  Psalm 6:2

## 2022-06-19 DIAGNOSIS — Z515 Encounter for palliative care: Secondary | ICD-10-CM

## 2022-06-19 DIAGNOSIS — J159 Unspecified bacterial pneumonia: Secondary | ICD-10-CM | POA: Diagnosis not present

## 2022-06-19 LAB — TYPE AND SCREEN
ABO/RH(D): A POS
Antibody Screen: NEGATIVE
Unit division: 0

## 2022-06-19 LAB — CBC WITH DIFFERENTIAL/PLATELET
Abs Immature Granulocytes: 0.03 10*3/uL (ref 0.00–0.07)
Basophils Absolute: 0 10*3/uL (ref 0.0–0.1)
Basophils Relative: 0 %
Eosinophils Absolute: 0.2 10*3/uL (ref 0.0–0.5)
Eosinophils Relative: 4 %
HCT: 27.7 % — ABNORMAL LOW (ref 39.0–52.0)
Hemoglobin: 9.6 g/dL — ABNORMAL LOW (ref 13.0–17.0)
Immature Granulocytes: 1 %
Lymphocytes Relative: 4 %
Lymphs Abs: 0.2 10*3/uL — ABNORMAL LOW (ref 0.7–4.0)
MCH: 32.8 pg (ref 26.0–34.0)
MCHC: 34.7 g/dL (ref 30.0–36.0)
MCV: 94.5 fL (ref 80.0–100.0)
Monocytes Absolute: 0.5 10*3/uL (ref 0.1–1.0)
Monocytes Relative: 7 %
Neutro Abs: 5.4 10*3/uL (ref 1.7–7.7)
Neutrophils Relative %: 84 %
Platelets: 157 10*3/uL (ref 150–400)
RBC: 2.93 MIL/uL — ABNORMAL LOW (ref 4.22–5.81)
RDW: 14.4 % (ref 11.5–15.5)
WBC: 6.3 10*3/uL (ref 4.0–10.5)
nRBC: 0 % (ref 0.0–0.2)

## 2022-06-19 LAB — COMPREHENSIVE METABOLIC PANEL
ALT: 22 U/L (ref 0–44)
AST: 24 U/L (ref 15–41)
Albumin: 2.8 g/dL — ABNORMAL LOW (ref 3.5–5.0)
Alkaline Phosphatase: 47 U/L (ref 38–126)
Anion gap: 10 (ref 5–15)
BUN: 18 mg/dL (ref 6–20)
CO2: 26 mmol/L (ref 22–32)
Calcium: 8.3 mg/dL — ABNORMAL LOW (ref 8.9–10.3)
Chloride: 103 mmol/L (ref 98–111)
Creatinine, Ser: 1.69 mg/dL — ABNORMAL HIGH (ref 0.61–1.24)
GFR, Estimated: 47 mL/min — ABNORMAL LOW (ref >60)
Glucose, Bld: 123 mg/dL — ABNORMAL HIGH (ref 70–99)
Potassium: 3.3 mmol/L — ABNORMAL LOW (ref 3.5–5.1)
Sodium: 139 mmol/L (ref 135–145)
Total Bilirubin: 0.4 mg/dL (ref 0.3–1.2)
Total Protein: 6.1 g/dL — ABNORMAL LOW (ref 6.5–8.1)

## 2022-06-19 LAB — BPAM RBC
Blood Product Expiration Date: 202401172359
ISSUE DATE / TIME: 202312301236
Unit Type and Rh: 6200

## 2022-06-19 MED ORDER — ACETAMINOPHEN ER 650 MG PO TBCR: 650.0000 mg | EXTENDED_RELEASE_TABLET | Freq: Three times a day (TID) | ORAL | 0 refills | Status: AC | PRN

## 2022-06-19 MED ORDER — POTASSIUM CHLORIDE CRYS ER 20 MEQ PO TBCR
40.0000 meq | EXTENDED_RELEASE_TABLET | Freq: Once | ORAL | Status: AC
  Administered 2022-06-19: 40 meq via ORAL
  Filled 2022-06-19: qty 2

## 2022-06-19 MED ORDER — SODIUM CHLORIDE 0.9 % IV SOLN
510.0000 mg | Freq: Once | INTRAVENOUS | Status: AC
  Administered 2022-06-19: 510 mg via INTRAVENOUS
  Filled 2022-06-19: qty 17

## 2022-06-19 MED ORDER — PANTOPRAZOLE SODIUM 40 MG PO TBEC: 40.0000 mg | DELAYED_RELEASE_TABLET | Freq: Two times a day (BID) | ORAL | 0 refills | Status: DC

## 2022-06-19 MED ORDER — ACETAMINOPHEN 325 MG PO TABS
650.0000 mg | ORAL_TABLET | Freq: Four times a day (QID) | ORAL | Status: DC | PRN
  Administered 2022-06-19 (×2): 650 mg via ORAL
  Filled 2022-06-19 (×2): qty 2

## 2022-06-19 MED ORDER — OXYCODONE HCL ER 10 MG PO T12A
10.0000 mg | EXTENDED_RELEASE_TABLET | Freq: Once | ORAL | Status: AC
  Administered 2022-06-19: 10 mg via ORAL
  Filled 2022-06-19: qty 1

## 2022-06-19 MED ORDER — OXYCODONE HCL ER 10 MG PO T12A: 10.0000 mg | EXTENDED_RELEASE_TABLET | Freq: Two times a day (BID) | ORAL | 0 refills | Status: DC

## 2022-06-19 MED ORDER — FOLIC ACID 1 MG PO TABS
2.0000 mg | ORAL_TABLET | Freq: Every day | ORAL | Status: DC
  Administered 2022-06-19: 2 mg via ORAL
  Filled 2022-06-19: qty 2

## 2022-06-19 MED ORDER — AMOXICILLIN-POT CLAVULANATE 500-125 MG PO TABS: 1.0000 | ORAL_TABLET | Freq: Three times a day (TID) | ORAL | 0 refills | Status: AC

## 2022-06-19 MED ORDER — LEVALBUTEROL HCL 0.63 MG/3ML IN NEBU
0.6300 mg | INHALATION_SOLUTION | Freq: Three times a day (TID) | RESPIRATORY_TRACT | Status: DC
  Administered 2022-06-19 (×2): 0.63 mg via RESPIRATORY_TRACT
  Filled 2022-06-19 (×2): qty 3

## 2022-06-19 NOTE — Care Management (Addendum)
DME Notified [employer] of qualifying oxygen saturations. They will follow today for orders. Patient requiring 6L flow while ambulating. Per [employer] policy patient would need to transport 6 tanks (one per liter required) home with him, and they will set up same day delivery of concentrator.  [facility] aware that patient lives in [place].  Spoke w patient's wife, she understands to call [employer] when they are close to home/ immediately after getting home to notify driver to deliver concentrator to the house.  [place] Spoke w wife, she states that they are active w [facility], [phone] for [facility] care. [facility], made them aware that patient will DC from the hospital today and need to resume [facility] services. They did not require new [place] orders.  Patient's wife also contacted them today to notify them of the same.

## 2022-06-19 NOTE — Progress Notes (Signed)
James Byrd might be a little bit better.  He had a CT scan yesterday.  He does have a large left perinephric hematoma.  I am sure this is why his hemoglobin is quite low.  I am not surprised that his iron is low.  We need to give him a dose of IV iron.  He only got 1 unit of blood and not 2 units of blood.  I think what is more worrisome is the fact that his melanoma clearly is progressing.  Some of the discomfort that he is having in the abdomen could be from his melanoma.  I think we really have to try to get him on treatment.  He has the Tafinlar pills at home.  His wife will bring them in.  I think we need to get him started on this.  If they are going to work, they should be able to work pretty quickly.  He still is on oxygen.  His oxygen levels go down at nighttime a little bit.  He does not have much of an appetite.  I can understand this.  He says when he eats, he has abdominal pain.  This might be from the umbilical hernia.  It might be from his malignancy.  His labs show sodium 139.  Potassium 3.3.  BUN 18 creatinine 1.69.  Calcium 8.3 with an albumin of 2.8.  The white cell count is 6.3.  Hemoglobin 9.6.  Platelet count 157,000.  Again, I thought he really needed to have 2 units of blood.  I think this hematoma clearly shows that he had a significant bleed.  I am not sure if he still having any bleeding into this area.  His vital signs are temperature of 100.4.  Pulse 86.  Blood pressure 120/63.  Oxygen saturation is 92% on 4 L.  His lungs still sound somewhat congested.  There is some wheezes.  He has some rhonchi bilaterally.  Cardiac exam regular rate and rhythm.  There are no murmurs, rubs or bruits.  Abdomen is soft.  He has an umbilical hernia.  It is reducible.  He has active bowel sounds.  There is no fluid wave.  Extremity shows no clubbing, cyanosis or edema.  Mr. Hamberger has the melanoma.  This is progressing.  Our options are very limited.  We will try him on Tafinlar, which she has  at home, because there is a specific mutation that his melanoma has.  Again, I will give some IV iron.  I will also put him on some folic acid.  I do appreciate the great care that he is getting from everybody on 2C.  Lattie Haw, MD  Proverbs 17:17

## 2022-06-19 NOTE — Progress Notes (Signed)
SATURATION QUALIFICATIONS: (This note is used to comply with regulatory documentation for home oxygen)  Patient Saturations on Room Air at Rest = 86%  Patient Saturations on Room Air while Ambulating = 83%  Patient Saturations on 6 Liters of oxygen while Ambulating = 91%  Please briefly explain why patient needs home oxygen: Patient recovering from acute PNA,

## 2022-06-19 NOTE — Discharge Summary (Signed)
Physician Discharge Summary  Kyvon Hu FBP:[id number] DOB: [birth year] DOA: 06/17/2022  PCP: Emmaline Kluver, MD  Admit date: 06/17/2022 Discharge date: 06/19/2022  Admitted From: Home Disposition: Home  Recommendations for Outpatient Follow-up:  Follow up with PCP later this week as scheduled Follow Dr. Marin Olp to further discussions further treatment or imaging needs  Home Health: Resume home health Equipment/Devices: No new equipment, continue oxygen 4 L at rest, 6 L with exertion  Discharge Condition: Poor CODE STATUS: Full Diet recommendation: As tolerated  Brief/Interim Summary: Helio Lack is a 56 y.o. male with recent hospitalization to outside facility found to have community-acquired pneumonia acute hypoxia as well as incidentally noted left kidney hematoma.  Patient discharged home to complete antibiotic course and was discharged from their facility without supplemental oxygen requirements.  Over the past 48 hours patient had worsening dyspnea with exertion, fever as high as 103 and presented to our facility for further evaluation and treatment.   Patient has known medical history of malignant melanoma of the right toe, neuroendocrine carcinoma of the pancreas, history of PE 2021 sleep apnea, rheumatoid arthritis as well as nephrolithiasis and gout.  His chronic medical conditions appear to be stable per our discussion.  In the ED patient underwent CTA to confirm no acute PE given his history and recent hospitalization which was negative for filling defect but remarkable for diffuse airspace disease consistent with bacterial pneumonia.  Patient admitted as above with acute hypoxic respiratory failure secondary to likely recurrent pneumonia versus HCAP.  Patient improved over the initial 48 hours with antibiotics supportive care oxygen and fluids.  Patient followed by oncology also receiving 1 unit PRBC for anemia and repeat imaging of the abdomen to further evaluate  renal hematoma.  Unfortunately imaging was indicative of worsening metastatic disease.  Patient clearly depressed today with this new report.  We discussed further hospitalization versus outpatient workup, given patient's symptoms have improved drastically over the past 24 hours he is able to ambulate on 6 L O2 to maintain sats without any overt dyspnea or symptoms we discussed discharging home with close follow-up with PCP, oncology as well as close follow-up with palliative care.  Attempted goals of care discussion today with daughter over the phone about patient's wishes.  Most notably attempting to assess patient's wishes if he were to go home and continue to decline would he want to be readmitted to the hospital, be placed on a ventilator, have CPR, or undergo aggressive treatment with the knowledge that he has worsening metastatic disease.  No clear answer was provided but we discussed the need for further conversations about these difficult decisions if patient continues to decline as we only want what is best for him and to honor his wishes.  We understand that patient had devastating news this morning with worsening disease that he thought was previously improving.  Patient otherwise stable and agreeable for discharge home again with very close follow-up with PCP, oncology as well as palliative care.  Medication changes as below, most notably increased patient's narcotic regimen with scheduled low-dose OxyContin given poor symptom control in the hospital with worsening abdominal pain concerning for metastatic disease involvement.  Patient also continue remainder of his course of antibiotics, transition from Unasyn to Augmentin to complete 7-day course.   Discharge Diagnoses:  Principal Problem:   Hospital-acquired bacterial pneumonia Active Problems:   Paroxysmal supraventricular tachycardia   Anxiety   Obstructive sleep apnea   Malignant melanoma of right great toe Stage IIIB ([id number])  Thrombocytopenia ([place])   Acute respiratory failure with hypoxia (HCC)   AKI (acute kidney injury) ([place])   Neuroendocrine carcinoma of pancreas ([place])   Metastatic melanoma to lymph node [facility])   Palliative care patient    Discharge Instructions  Discharge Instructions     For home use only DME oxygen   Complete by: As directed    Length of Need: Lifetime   Mode or (Route): Nasal cannula   Liters per Minute: 6   Frequency: Continuous (stationary and portable oxygen unit needed)   Oxygen conserving device: Yes   Oxygen delivery system: Gas      Allergies as of 06/19/2022   No Known Allergies      Medication List     STOP taking these medications    BLACK CURRANT SEED OIL PO   predniSONE 10 MG tablet Commonly known as: DELTASONE       TAKE these medications    acetaminophen 650 MG CR tablet Commonly known as: TYLENOL Take 1 tablet (650 mg total) by mouth every 8 (eight) hours as needed for pain. What changed:  how much to take when to take this   amoxicillin-clavulanate 500-125 MG tablet Commonly known as: Augmentin Take 1 tablet by mouth 3 (three) times daily for 4 days.   augmented betamethasone dipropionate 0.05 % cream Commonly known as: DIPROLENE-AF Apply 1 application topically 2 (two) times daily as needed (psoriasis).   cyanocobalamin 2000 MCG tablet Take 2,000 mcg by mouth daily. Vitamin b12   diclofenac sodium 1 % Gel Commonly known as: VOLTAREN Apply 2 g topically daily as needed (pain).   DULoxetine 20 MG capsule Commonly known as: CYMBALTA Take 20 mg by mouth daily.   fentaNYL 25 MCG/HR Commonly known as: [place] 1 patch onto the skin every 3 (three) days.   FISH OIL PO Take 1 capsule by mouth daily.   folic acid 1 MG tablet Commonly known as: FOLVITE Take 2 tablets (2 mg total) by mouth daily.   hydrocortisone 5 MG tablet Commonly known as: CORTEF Take 1 tablet (5 mg total) by mouth 3 (three) times daily.    leflunomide 10 MG tablet Commonly known as: ARAVA Take 2 tablets (20 mg total) by mouth daily.   multivitamin with minerals Tabs tablet Take 1 tablet by mouth daily.   OLANZapine 5 MG tablet Commonly known as: ZYPREXA Take 1 tablet (5 mg total) by mouth at bedtime.   ondansetron 8 MG disintegrating tablet Commonly known as: ZOFRAN-ODT Take 1 tablet (8 mg total) by mouth every 8 (eight) hours as needed for nausea or vomiting.   OVER THE COUNTER MEDICATION Take 0.5 each by mouth as needed. CBD gummies   oxyCODONE 10 mg 12 hr tablet Commonly known as: OxyCONTIN Take 1 tablet (10 mg total) by mouth every 12 (twelve) hours.   oxyCODONE-acetaminophen 10-325 MG tablet Commonly known as: PERCOCET Take 1 tablet by mouth every 4 (four) hours as needed for pain.   pantoprazole 40 MG tablet Commonly known as: PROTONIX Take 1 tablet (40 mg total) by mouth 2 (two) times daily.   pregabalin 75 MG capsule Commonly known as: LYRICA Take 1 capsule (75 mg total) by mouth 2 (two) times daily.   pyridOXINE 100 MG tablet Commonly known as: VITAMIN B6 Take 100 mg by mouth daily.   solifenacin 10 MG tablet Commonly known as: VESICARE Take 1 tablet (10 mg total) by mouth daily.   Testosterone 20.25 MG/ACT (1.62%) Gel   vitamin C 1000  MG tablet Take 1,000 mg by mouth daily.   Vitamin D 50 MCG (2000 UT) tablet Take 2,000 Units by mouth daily.   zinc gluconate 50 MG tablet Take 50 mg by mouth daily.               Durable Medical Equipment  (From admission, onward)           Start     Ordered   06/19/22 0000  For home use only DME oxygen       Question Answer Comment  Length of Need Lifetime   Mode or (Route) Nasal cannula   Liters per Minute 6   Frequency Continuous (stationary and portable oxygen unit needed)   Oxygen conserving device Yes   Oxygen delivery system Gas      06/19/22 1224            No Known  Allergies  Consultations: Oncology  Procedures/Studies: CT ABDOMEN PELVIS W CONTRAST  Result Date: 06/18/2022 CLINICAL DATA:  History of skin cancer, assess treatment response. Evaluate for resolution of left perinephric hematoma. * Tracking Code: BO * EXAM: CT ABDOMEN AND PELVIS WITH CONTRAST TECHNIQUE: Multidetector CT imaging of the abdomen and pelvis was performed using the standard protocol following bolus administration of intravenous contrast. RADIATION DOSE REDUCTION: This exam was performed according to the departmental dose-optimization program which includes automated exposure control, adjustment of the mA and/or kV according to patient size and/or use of iterative reconstruction technique. CONTRAST:  38m OMNIPAQUE IOHEXOL 350 MG/ML SOLN COMPARISON:  CTA chest June 17, 2022 and outside CT May 25, 2022. FINDINGS: Lower chest: Consolidative masslike opacity in the left lower lobe. Nodular consolidative opacities in the right lower lobe and left upper lobe with tiny bilateral pleural effusions. Hepatobiliary: Stable hepatic cysts and hypodense hepatic lesions technically too small to accurately characterize but statistically likely reflect cysts or hemangiomas. Gallbladder surgically absent. No biliary ductal dilation. Pancreas: No pancreatic ductal dilation or evidence of acute inflammation. Spleen: No splenomegaly. Adrenals/Urinary Tract: Bilateral adrenal glands appear normal. No hydronephrosis. There is a large left perinephric/subcapsular hematoma which encases and distorts the left kidney measuring 11.5 x 10.4 x 6.7 cm on images 61/7 and 44/4. Symmetric enhancement and excretion of contrast from the bilateral kidneys. Right upper pole renal lesion measures 19 mm on image 36/4 measuring Hounsfield units of water density compatible with a cyst. Renal lesion in the right interpolar renal sinus measures 2.9 cm on image 40/4 with Hounsfield units of 28. 16 mm right lower pole renal lesion  measures Hounsfield units of 33 on image 49/4. Partially exophytic 19 mm left upper pole renal lesion on image 40/4 measures Hounsfield units of water compatible with a cyst. Lesion in the left lower pole kidney measures 3.2 cm on image 51/4 with Hounsfield units of 26. Urinary bladder is unremarkable for degree of distension. Stomach/Bowel: Stomach is unremarkable for degree of distension. Radiopaque ingested material traverses the hepatic flexure. No pathologic dilation of small or large bowel. No evidence of acute bowel inflammation. Vascular/Lymphatic: Normal caliber abdominal aorta. Centrally necrotic pre caval lymph node measures 3.5 x 3.2 cm on image 44/4 previously 2.8 x 2.7 cm. Centrally left periaortic lymph node conglomerate just below of the iliac bifurcation measures 4.0 x 3.9 cm on image 50/4, previously 3.4 x 2.9 cm Centrally necrotic right pelvic sidewall lymph node measures 3.6 x 2.8 cm on image 88/4 previously 3.2 x 2.4 cm. Reproductive: No suspicious mass or acute abnormality. Other: Heterogeneous fluid tracts  in the left perinephric space. Tiny subcutaneous nodules in the right flank on image 81 and 80 2/4 are unchanged from prior. Mild body wall edema. Postsurgical change in the abdominal wall. Diastasis rectus with fat and nonobstructed bowel containing ventral hernia. Musculoskeletal: No aggressive lytic or blastic lesion of bone. IMPRESSION: 1. Large left perinephric/subcapsular hematoma which encases and distorts the left kidney measuring 11.5 x 10.4 x 6.7 cm while this morphology can be seen with Page kidney there is symmetric enhancement and excretion of contrast, suggest clinical monitoring for complication. 2. Increased size of the centrally necrotic retroperitoneal and right pelvic sidewall adenopathy, consistent with disease progression. 3. Multiple bilateral renal lesions which are incompletely characterized on this examination, many of which measure Hounsfield units greater than that  of fluid density and possibly reflect a combination of simple and hemorrhagic/proteinaceous cysts. However a solid renal mass is not excluded. Consider more definitive characterization with nonemergent renal protocol MRI with and without contrast preferably as an outpatient upon resolution of patient's current symptomatology when they are better able to follow commands including breath hold. 4. Tiny indeterminate subcutaneous nodules in the right flank are unchanged from prior. 5. Consolidative masslike opacity in the left lower lobe with nodular consolidative opacities in the right lower lobe and left upper lobe and tiny bilateral pleural effusions. Findings are favored to reflect an infectious or inflammatory process. Electronically Signed   By: Dahlia Bailiff M.D.   On: 06/18/2022 15:25   CT Angio Chest PE W/Cm &/Or Wo Cm  Result Date: 06/17/2022 CLINICAL DATA:  Short of breath, concern for pulmonary embolism. Cough and fever. Metastatic melanoma. * Tracking Code: BO * EXAM: CT ANGIOGRAPHY CHEST WITH CONTRAST TECHNIQUE: Multidetector CT imaging of the chest was performed using the standard protocol during bolus administration of intravenous contrast. Multiplanar CT image reconstructions and MIPs were obtained to evaluate the vascular anatomy. RADIATION DOSE REDUCTION: This exam was performed according to the departmental dose-optimization program which includes automated exposure control, adjustment of the mA and/or kV according to patient size and/or use of iterative reconstruction technique. CONTRAST:  31m OMNIPAQUE IOHEXOL 350 MG/ML SOLN COMPARISON:  None Available. FINDINGS: Cardiovascular: No tubular filling defects within the pulmonary suggest acute pulmonary embolism. There is motion artifact. Additionally there is poor arterial contrast bolus within the pulmonary arteries. Mediastinum/Nodes: No axillary or supraclavicular adenopathy. No mediastinal or hilar adenopathy. No pericardial fluid.  Esophagus normal. Lungs/Pleura: Peripheral consolidation in the RIGHT upper lobe. This consolidation is masslike measuring 4.3 by 3.1 cm (image 45/series 6). Similar peripheral consolidation in the superior aspect of the RIGHT lower lobe measuring 4.4 cm (image 56/6. There is bibasilar medial consolidation/atelectasis. Small effusions. Upper Abdomen: Limited view of the liver, kidneys, pancreas are unremarkable. Normal adrenal glands. Musculoskeletal: No aggressive osseous lesion. Review of the MIP images confirms the above findings. IMPRESSION: 1. Masslike consolidation in the RIGHT upper lobe and RIGHT lower lobe is most suggestive of multifocal pneumonia. Recommend follow-up CT to exclude melanoma recurrence. 2. No evidence acute pulmonary embolism on suboptimal exam. 3. Bibasilar dense medial atelectasis and small effusions. Electronically Signed   By: SSuzy BouchardM.D.   On: 06/17/2022 16:08   DG Chest 2 View  Result Date: 06/17/2022 CLINICAL DATA:  Shortness of breath. EXAM: CHEST - 2 VIEW COMPARISON:  Chest radiograph and chest CTA 05/14/2022 FINDINGS: The cardiomediastinal silhouette is unchanged with normal heart size. Lung volumes are low. Masslike opacity in the right upper lobe appears to have mildly decreased in craniocaudal  extent compared to the prior radiograph. Mild streaky right basilar opacity favors atelectasis. There is persistent asymmetric opacity in the left lung base with a possible small left pleural effusion. No pneumothorax is identified. No acute osseous abnormality is seen. IMPRESSION: 1. Apparent mild interval contraction of masslike opacity in the right upper lobe which was favored to be infectious/inflammatory on prior CT. 2. Persistent left basilar atelectasis or consolidation with possible small left pleural effusion. Electronically Signed   By: Logan Bores M.D.   On: 06/17/2022 14:38     Subjective: No acute issues or events overnight, respiratory status  improving   Discharge Exam: Vitals:   06/19/22 0841 06/19/22 1438  BP:    Pulse:    Resp:    Temp:    SpO2: 93% 91%   Vitals:   06/19/22 0418 06/19/22 0809 06/19/22 0841 06/19/22 1438  BP: 120/63 119/87    Pulse: 86 83    Resp: 16 14    Temp: (!) 100.4 F (38 C) 99.1 F (37.3 C)    TempSrc: Oral Oral    SpO2: 92% 94% 93% 91%  Weight:      Height:        General: Pt is alert, awake, not in acute distress Cardiovascular: RRR, S1/S2 +, no rubs, no gallops Respiratory: Right lower lobe rhonchi without overt wheeze or rales Abdominal: Soft, NT, ND, bowel sounds + Extremities: no edema, no cyanosis    The results of significant diagnostics from this hospitalization (including imaging, microbiology, ancillary and laboratory) are listed below for reference.     Microbiology: Recent Results (from the past 240 hour(s))  Resp panel by RT-PCR (RSV, Flu A&B, Covid) Anterior Nasal Swab     Status: None   Collection Time: 06/17/22  3:12 PM   Specimen: Anterior Nasal Swab  Result Value Ref Range Status   SARS Coronavirus 2 by RT PCR NEGATIVE NEGATIVE Final    Comment: (NOTE) SARS-CoV-2 target nucleic acids are NOT DETECTED.  The SARS-CoV-2 RNA is generally detectable in upper respiratory specimens during the acute phase of infection. The lowest concentration of SARS-CoV-2 viral copies this assay can detect is 138 copies/mL. A negative result does not preclude SARS-Cov-2 infection and should not be used as the sole basis for treatment or other patient management decisions. A negative result may occur with  improper specimen collection/handling, submission of specimen other than nasopharyngeal swab, presence of viral mutation(s) within the areas targeted by this assay, and inadequate number of viral copies(<138 copies/mL). A negative result must be combined with clinical observations, patient history, and epidemiological information. The expected result is Negative.  Fact  Sheet for Patients:  EntrepreneurPulse.com.au  Fact Sheet for Healthcare Providers:  IncredibleEmployment.be  This test is no t yet approved or cleared by the [place] FDA and  has been authorized for detection and/or diagnosis of SARS-CoV-2 by FDA under an Emergency Use Authorization (EUA). This EUA will remain  in effect (meaning this test can be used) for the duration of the COVID-19 declaration under Section 564(b)(1) of the Act, 21 U.S.C.section 360bbb-3(b)(1), unless the authorization is terminated  or revoked sooner.       Influenza A by PCR NEGATIVE NEGATIVE Final   Influenza B by PCR NEGATIVE NEGATIVE Final    Comment: (NOTE) The Xpert Xpress SARS-CoV-2/FLU/RSV plus assay is intended as an aid in the diagnosis of influenza from Nasopharyngeal swab specimens and should not be used as a sole basis for treatment. Nasal washings and aspirates  are unacceptable for Xpert Xpress SARS-CoV-2/FLU/RSV testing.  Fact Sheet for Patients: EntrepreneurPulse.com.au  Fact Sheet for Healthcare Providers: IncredibleEmployment.be  This test is not yet approved or cleared by the [place] FDA and has been authorized for detection and/or diagnosis of SARS-CoV-2 by FDA under an Emergency Use Authorization (EUA). This EUA will remain in effect (meaning this test can be used) for the duration of the COVID-19 declaration under Section 564(b)(1) of the Act, 21 U.S.C. section 360bbb-3(b)(1), unless the authorization is terminated or revoked.     Resp Syncytial Virus by PCR NEGATIVE NEGATIVE Final    Comment: (NOTE) Fact Sheet for Patients: EntrepreneurPulse.com.au  Fact Sheet for Healthcare Providers: IncredibleEmployment.be  This test is not yet approved or cleared by the [place] FDA and has been authorized for detection and/or diagnosis of SARS-CoV-2 by FDA under an  Emergency Use Authorization (EUA). This EUA will remain in effect (meaning this test can be used) for the duration of the COVID-19 declaration under Section 564(b)(1) of the Act, 21 U.S.C. section 360bbb-3(b)(1), unless the authorization is terminated or revoked.  Performed at [facility], [place] [address], [place], [place] [address]   Blood culture (routine x 2)     Status: None (Preliminary result)   Collection Time: 06/17/22  3:12 PM   Specimen: BLOOD RIGHT WRIST  Result Value Ref Range Status   Specimen Description BLOOD RIGHT WRIST  Final   Special Requests   Final    BOTTLES DRAWN AEROBIC AND ANAEROBIC Blood Culture adequate volume   Culture   Final    NO GROWTH 2 DAYS Performed at [facility], [place] [address], [place], [place] [address]    Report Status PENDING  Incomplete  Blood culture (routine x 2)     Status: None (Preliminary result)   Collection Time: 06/17/22  3:25 PM   Specimen: BLOOD  Result Value Ref Range Status   Specimen Description BLOOD RIGHT ANTECUBITAL  Final   Special Requests   Final    BOTTLES DRAWN AEROBIC AND ANAEROBIC Blood Culture adequate volume   Culture   Final    NO GROWTH 2 DAYS Performed at [facility], [place] [address], [place], [place] [address]    Report Status PENDING  Incomplete  MRSA Next Gen by PCR, Nasal     Status: None   Collection Time: 06/17/22  9:25 PM   Specimen: Nasal Mucosa; Nasal Swab  Result Value Ref Range Status   MRSA by PCR Next Gen NOT DETECTED NOT DETECTED Final    Comment: (NOTE) The GeneXpert MRSA Assay (FDA approved for NASAL specimens only), is one component of a comprehensive MRSA colonization surveillance program. It is not intended to diagnose MRSA infection nor to guide or monitor treatment for MRSA infections. Test performance is not FDA approved in patients less than 48 years old. Performed at [facility], [place] [address] [place], [place] [address]      Labs: BNP (last 3  results) No results for input(s): "BNP" in the last 8760 hours. Basic Metabolic Panel: Recent Labs  Lab 06/17/22 1347 06/18/22 0010 06/19/22 0019  NA 134* 136 139  K 3.5 4.2 3.3*  CL 100 104 103  CO2 '22 22 26  '$ GLUCOSE 114* 201* 123*  BUN '16 20 18  '$ CREATININE 1.54* 1.60* 1.69*  CALCIUM 8.5* 8.1* 8.3*   Liver Function Tests: Recent Labs  Lab 06/17/22 1347 06/18/22 0010 06/19/22 0019  AST 22 22 24  ALT '24 21 22  '$ ALKPHOS 56 44 47  BILITOT 0.6 0.7 0.4  PROT 6.1* 5.2* 6.1*  ALBUMIN 2.8* 2.4* 2.8*   No results for input(s): "LIPASE", "AMYLASE" in the last 168 hours. No results for input(s): "AMMONIA" in the last 168 hours. CBC: Recent Labs  Lab 06/17/22 1347 06/18/22 0010 06/18/22 1659 06/19/22 0019  WBC 6.5 5.8  --  6.3  NEUTROABS 5.6 5.3  --  5.4  HGB 9.8* 8.4* 9.9* 9.6*  HCT 30.2* 24.6* 28.8* 27.7*  MCV 98.1 94.6  --  94.5  PLT 149* 148*  --  157   Cardiac Enzymes: No results for input(s): "CKTOTAL", "CKMB", "CKMBINDEX", "TROPONINI" in the last 168 hours. BNP: Invalid input(s): "POCBNP" CBG: No results for input(s): "GLUCAP" in the last 168 hours. D-Dimer No results for input(s): "DDIMER" in the last 72 hours. Hgb A1c No results for input(s): "HGBA1C" in the last 72 hours. Lipid Profile No results for input(s): "CHOL", "HDL", "LDLCALC", "TRIG", "CHOLHDL", "LDLDIRECT" in the last 72 hours. Thyroid function studies No results for input(s): "TSH", "T4TOTAL", "T3FREE", "THYROIDAB" in the last 72 hours.  Invalid input(s): "FREET3" Anemia work up Recent Labs    06/18/22 1032  TIBC 171*  IRON 21*   Urinalysis    Component Value Date/Time   COLORURINE YELLOW 07/16/2021 [place] 07/16/2021 1413   LABSPEC 1.019 07/16/2021 1413   PHURINE 5.0 07/16/2021 1413   GLUCOSEU NEGATIVE 07/16/2021 1413   HGBUR NEGATIVE 07/16/2021 1413   [place] 07/16/2021 1413   KETONESUR NEGATIVE 07/16/2021 1413   PROTEINUR NEGATIVE 07/16/2021 1413    UROBILINOGEN 0.2 03/06/2015 0928   NITRITE NEGATIVE 07/16/2021 1413   LEUKOCYTESUR NEGATIVE 07/16/2021 1413   Sepsis Labs Recent Labs  Lab 06/17/22 1347 06/18/22 0010 06/19/22 0019  WBC 6.5 5.8 6.3   Microbiology Recent Results (from the past 240 hour(s))  Resp panel by RT-PCR (RSV, Flu A&B, Covid) Anterior Nasal Swab     Status: None   Collection Time: 06/17/22  3:12 PM   Specimen: Anterior Nasal Swab  Result Value Ref Range Status   SARS Coronavirus 2 by RT PCR NEGATIVE NEGATIVE Final    Comment: (NOTE) SARS-CoV-2 target nucleic acids are NOT DETECTED.  The SARS-CoV-2 RNA is generally detectable in upper respiratory specimens during the acute phase of infection. The lowest concentration of SARS-CoV-2 viral copies this assay can detect is 138 copies/mL. A negative result does not preclude SARS-Cov-2 infection and should not be used as the sole basis for treatment or other patient management decisions. A negative result may occur with  improper specimen collection/handling, submission of specimen other than nasopharyngeal swab, presence of viral mutation(s) within the areas targeted by this assay, and inadequate number of viral copies(<138 copies/mL). A negative result must be combined with clinical observations, patient history, and epidemiological information. The expected result is Negative.  Fact Sheet for Patients:  EntrepreneurPulse.com.au  Fact Sheet for Healthcare Providers:  IncredibleEmployment.be  This test is no t yet approved or cleared by the [place] FDA and  has been authorized for detection and/or diagnosis of SARS-CoV-2 by FDA under an Emergency Use Authorization (EUA). This EUA will remain  in effect (meaning this test can be used) for the duration of the COVID-19 declaration under Section 564(b)(1) of the Act, 21 U.S.C.section 360bbb-3(b)(1), unless the authorization is terminated  or revoked sooner.        Influenza A by PCR NEGATIVE NEGATIVE Final   Influenza B by PCR  NEGATIVE NEGATIVE Final    Comment: (NOTE) The Xpert Xpress SARS-CoV-2/FLU/RSV plus assay is intended as an aid in the diagnosis of influenza from Nasopharyngeal swab specimens and should not be used as a sole basis for treatment. Nasal washings and aspirates are unacceptable for Xpert Xpress SARS-CoV-2/FLU/RSV testing.  Fact Sheet for Patients: EntrepreneurPulse.com.au  Fact Sheet for Healthcare Providers: IncredibleEmployment.be  This test is not yet approved or cleared by the [place] FDA and has been authorized for detection and/or diagnosis of SARS-CoV-2 by FDA under an Emergency Use Authorization (EUA). This EUA will remain in effect (meaning this test can be used) for the duration of the COVID-19 declaration under Section 564(b)(1) of the Act, 21 U.S.C. section 360bbb-3(b)(1), unless the authorization is terminated or revoked.     Resp Syncytial Virus by PCR NEGATIVE NEGATIVE Final    Comment: (NOTE) Fact Sheet for Patients: EntrepreneurPulse.com.au  Fact Sheet for Healthcare Providers: IncredibleEmployment.be  This test is not yet approved or cleared by the [place] FDA and has been authorized for detection and/or diagnosis of SARS-CoV-2 by FDA under an Emergency Use Authorization (EUA). This EUA will remain in effect (meaning this test can be used) for the duration of the COVID-19 declaration under Section 564(b)(1) of the Act, 21 U.S.C. section 360bbb-3(b)(1), unless the authorization is terminated or revoked.  Performed at [facility], [place] [address], [place], [place] [address]   Blood culture (routine x 2)     Status: None (Preliminary result)   Collection Time: 06/17/22  3:12 PM   Specimen: BLOOD RIGHT WRIST  Result Value Ref Range Status   Specimen Description BLOOD RIGHT WRIST  Final   Special Requests    Final    BOTTLES DRAWN AEROBIC AND ANAEROBIC Blood Culture adequate volume   Culture   Final    NO GROWTH 2 DAYS Performed at [facility], [place] [address], [place], [place] [address]    Report Status PENDING  Incomplete  Blood culture (routine x 2)     Status: None (Preliminary result)   Collection Time: 06/17/22  3:25 PM   Specimen: BLOOD  Result Value Ref Range Status   Specimen Description BLOOD RIGHT ANTECUBITAL  Final   Special Requests   Final    BOTTLES DRAWN AEROBIC AND ANAEROBIC Blood Culture adequate volume   Culture   Final    NO GROWTH 2 DAYS Performed at [facility], [place] [address] [place], [place] [address]    Report Status PENDING  Incomplete  MRSA Next Gen by PCR, Nasal     Status: None   Collection Time: 06/17/22  9:25 PM   Specimen: Nasal Mucosa; Nasal Swab  Result Value Ref Range Status   MRSA by PCR Next Gen NOT DETECTED NOT DETECTED Final    Comment: (NOTE) The GeneXpert MRSA Assay (FDA approved for NASAL specimens only), is one component of a comprehensive MRSA colonization surveillance program. It is not intended to diagnose MRSA infection nor to guide or monitor treatment for MRSA infections. Test performance is not FDA approved in patients less than 70 years old. Performed at [facility], [place] [address], [place], [place] [address]      Time coordinating discharge: Over 30 minutes  SIGNED:   Little Ishikawa, DO [facility] 06/19/2022, 4:07 PM Pager   If 7PM-7AM, please contact night-coverage [email or web address]

## 2022-06-21 ENCOUNTER — Ambulatory Visit: Payer: PPO

## 2022-06-21 ENCOUNTER — Encounter: Payer: Self-pay | Admitting: Hematology & Oncology

## 2022-06-21 LAB — ERYTHROPOIETIN: Erythropoietin: 65.2 m[IU]/mL — ABNORMAL HIGH (ref 2.6–18.5)

## 2022-06-21 NOTE — Care Management (Signed)
POST DISCHARGE NOTE  06/21/22 11:20 Received an after discharge call back from [facility]. They are unable to service. Spoke w patient's wife and she would like [place] for [facility]. Referral accepted by [facility] for [facility] RN. Wife updated that she will get a call to set up home appointments from [place]. No other TOC needs identified.

## 2022-06-22 ENCOUNTER — Other Ambulatory Visit: Payer: Self-pay | Admitting: Hematology & Oncology

## 2022-06-22 DIAGNOSIS — Z8701 Personal history of pneumonia (recurrent): Secondary | ICD-10-CM | POA: Diagnosis not present

## 2022-06-22 DIAGNOSIS — K689 Other disorders of retroperitoneum: Secondary | ICD-10-CM | POA: Diagnosis not present

## 2022-06-22 DIAGNOSIS — R58 Hemorrhage, not elsewhere classified: Secondary | ICD-10-CM | POA: Diagnosis not present

## 2022-06-22 DIAGNOSIS — M859 Disorder of bone density and structure, unspecified: Secondary | ICD-10-CM | POA: Diagnosis not present

## 2022-06-22 DIAGNOSIS — J9601 Acute respiratory failure with hypoxia: Secondary | ICD-10-CM | POA: Diagnosis not present

## 2022-06-22 DIAGNOSIS — M064 Inflammatory polyarthropathy: Secondary | ICD-10-CM | POA: Diagnosis not present

## 2022-06-22 DIAGNOSIS — J159 Unspecified bacterial pneumonia: Secondary | ICD-10-CM | POA: Diagnosis not present

## 2022-06-22 DIAGNOSIS — C439 Malignant melanoma of skin, unspecified: Secondary | ICD-10-CM | POA: Diagnosis not present

## 2022-06-22 DIAGNOSIS — C4371 Malignant melanoma of right lower limb, including hip: Secondary | ICD-10-CM | POA: Diagnosis not present

## 2022-06-22 DIAGNOSIS — E2740 Unspecified adrenocortical insufficiency: Secondary | ICD-10-CM | POA: Diagnosis not present

## 2022-06-22 DIAGNOSIS — C779 Secondary and unspecified malignant neoplasm of lymph node, unspecified: Secondary | ICD-10-CM | POA: Diagnosis not present

## 2022-06-22 DIAGNOSIS — C7800 Secondary malignant neoplasm of unspecified lung: Secondary | ICD-10-CM | POA: Diagnosis not present

## 2022-06-22 DIAGNOSIS — Z79891 Long term (current) use of opiate analgesic: Secondary | ICD-10-CM | POA: Diagnosis not present

## 2022-06-22 DIAGNOSIS — J189 Pneumonia, unspecified organism: Secondary | ICD-10-CM | POA: Diagnosis not present

## 2022-06-22 DIAGNOSIS — G629 Polyneuropathy, unspecified: Secondary | ICD-10-CM | POA: Diagnosis not present

## 2022-06-22 LAB — CULTURE, BLOOD (ROUTINE X 2)
Culture: NO GROWTH
Culture: NO GROWTH
Special Requests: ADEQUATE
Special Requests: ADEQUATE

## 2022-06-22 MED ORDER — MORPHINE SULFATE ER 15 MG PO TBCR: 15.0000 mg | EXTENDED_RELEASE_TABLET | Freq: Two times a day (BID) | ORAL | 0 refills | Status: DC

## 2022-06-23 ENCOUNTER — Inpatient Hospital Stay: Payer: PPO | Attending: Hematology & Oncology

## 2022-06-23 ENCOUNTER — Encounter: Payer: Self-pay | Admitting: Hematology & Oncology

## 2022-06-23 ENCOUNTER — Inpatient Hospital Stay: Payer: PPO

## 2022-06-23 ENCOUNTER — Other Ambulatory Visit: Payer: Self-pay | Admitting: *Deleted

## 2022-06-23 ENCOUNTER — Ambulatory Visit: Payer: PPO

## 2022-06-23 ENCOUNTER — Other Ambulatory Visit: Payer: Self-pay

## 2022-06-23 ENCOUNTER — Inpatient Hospital Stay (HOSPITAL_BASED_OUTPATIENT_CLINIC_OR_DEPARTMENT_OTHER): Payer: PPO | Admitting: Hematology & Oncology

## 2022-06-23 VITALS — BP 112/74 | HR 80 | Temp 97.4°F | Resp 19 | Ht 72.0 in | Wt 281.0 lb

## 2022-06-23 DIAGNOSIS — C4371 Malignant melanoma of right lower limb, including hip: Secondary | ICD-10-CM

## 2022-06-23 DIAGNOSIS — D509 Iron deficiency anemia, unspecified: Secondary | ICD-10-CM | POA: Insufficient documentation

## 2022-06-23 DIAGNOSIS — Z79899 Other long term (current) drug therapy: Secondary | ICD-10-CM | POA: Insufficient documentation

## 2022-06-23 DIAGNOSIS — C7A8 Other malignant neuroendocrine tumors: Secondary | ICD-10-CM | POA: Insufficient documentation

## 2022-06-23 DIAGNOSIS — C779 Secondary and unspecified malignant neoplasm of lymph node, unspecified: Secondary | ICD-10-CM

## 2022-06-23 LAB — CMP (CANCER CENTER ONLY)
ALT: 20 U/L (ref 0–44)
AST: 20 U/L (ref 15–41)
Albumin: 3.7 g/dL (ref 3.5–5.0)
Alkaline Phosphatase: 47 U/L (ref 38–126)
Anion gap: 9 (ref 5–15)
BUN: 26 mg/dL — ABNORMAL HIGH (ref 6–20)
CO2: 31 mmol/L (ref 22–32)
Calcium: 9 mg/dL (ref 8.9–10.3)
Chloride: 100 mmol/L (ref 98–111)
Creatinine: 1.63 mg/dL — ABNORMAL HIGH (ref 0.61–1.24)
GFR, Estimated: 49 mL/min — ABNORMAL LOW (ref >60)
Glucose, Bld: 177 mg/dL — ABNORMAL HIGH (ref 70–99)
Potassium: 4.1 mmol/L (ref 3.5–5.1)
Sodium: 140 mmol/L (ref 135–145)
Total Bilirubin: 0.5 mg/dL (ref 0.3–1.2)
Total Protein: 6.9 g/dL (ref 6.5–8.1)

## 2022-06-23 LAB — CBC WITH DIFFERENTIAL (CANCER CENTER ONLY)
Abs Immature Granulocytes: 0.02 10*3/uL (ref 0.00–0.07)
Basophils Absolute: 0 10*3/uL (ref 0.0–0.1)
Basophils Relative: 0 %
Eosinophils Absolute: 0 10*3/uL (ref 0.0–0.5)
Eosinophils Relative: 0 %
HCT: 29.8 % — ABNORMAL LOW (ref 39.0–52.0)
Hemoglobin: 9.6 g/dL — ABNORMAL LOW (ref 13.0–17.0)
Immature Granulocytes: 0 %
Lymphocytes Relative: 3 %
Lymphs Abs: 0.2 10*3/uL — ABNORMAL LOW (ref 0.7–4.0)
MCH: 31.2 pg (ref 26.0–34.0)
MCHC: 32.2 g/dL (ref 30.0–36.0)
MCV: 96.8 fL (ref 80.0–100.0)
Monocytes Absolute: 0.4 10*3/uL (ref 0.1–1.0)
Monocytes Relative: 6 %
Neutro Abs: 5.2 10*3/uL (ref 1.7–7.7)
Neutrophils Relative %: 91 %
Platelet Count: 202 10*3/uL (ref 150–400)
RBC: 3.08 MIL/uL — ABNORMAL LOW (ref 4.22–5.81)
RDW: 13.9 % (ref 11.5–15.5)
WBC Count: 5.8 10*3/uL (ref 4.0–10.5)
nRBC: 0 % (ref 0.0–0.2)

## 2022-06-23 LAB — IRON AND IRON BINDING CAPACITY (CC-WL,HP ONLY)
Iron: 52 ug/dL (ref 45–182)
Saturation Ratios: 30 % (ref 17.9–39.5)
TIBC: 172 ug/dL — ABNORMAL LOW (ref 250–450)
UIBC: 120 ug/dL (ref 117–376)

## 2022-06-23 LAB — LACTATE DEHYDROGENASE: LDH: 435 U/L — ABNORMAL HIGH (ref 98–192)

## 2022-06-23 LAB — FERRITIN: Ferritin: 2988 ng/mL — ABNORMAL HIGH (ref 24–336)

## 2022-06-23 MED ORDER — METHYLPHENIDATE HCL 10 MG PO TABS: 10.0000 mg | ORAL_TABLET | Freq: Every day | ORAL | 0 refills | Status: AC

## 2022-06-23 MED ORDER — PROCHLORPERAZINE MALEATE 10 MG PO TABS: 10.0000 mg | ORAL_TABLET | Freq: Four times a day (QID) | ORAL | 2 refills | Status: AC | PRN

## 2022-06-23 NOTE — Progress Notes (Signed)
Hematology and Oncology Follow Up Visit  James Byrd [id number] [birth year] 57 y.o. 06/23/2022   Principle Diagnosis:  Stage IIIB ([id number]) subungual melanoma of the right hallux -- nodal recurrence in the RIGHT inguinal nodes -- BRAF wt RIGHT Common Iliac node recurrence Progression of melanoma-lymph node metastasis- 02/2021 Neuroendocrine carcinoma of the pancreas Iron def anemia   Past Therapy:        Yervoy-status post 3 cycles - discontinued in September 2016 secondary to toxicity Somatuline 120 mg IM q month -- started on 05/22/2019 - d/c on 08/2019 XRT to the RIGHT inguinal basin XRT to abdominal lymph nodes-completed 09/21/2021  Current Therapy:   Nivolumab 480 mg q month -- started on 03/19/2021, s/p cycle #4 IV Iron as indicated  Status post surgical resection of abdominal disease - 11/2021 SBRT-lung nodules -start on 01/25/2022 Dacarbazine - IV -s/p cycle #3 -- start on 01/26/2022  Tafinlar 2 mg po q day - start on 06/23/2022   Interim History:  James Byrd is here today for follow-up.  He was just discharged from the hospital.  He was admitted with what look like a pneumonia.  He was having a lot of pain.  He had been down at hospital an  [facility] with a bleed around his left kidney.  He has required to be transfused.  He had seen a oncologist at [facility].  He recommended Tafinlar, despite the fact that there is no BRAF mutation.  Apparently, there is a special mutation that the James Byrd might work for her.  Hopefully, he will start this today.  He really has very little options left.  I know he is trying hard.  He really has been through quite a lot.  He is on a Duragesic patch.  Because this, he does not need to have anything oral that is long-acting.  He has had a little bit of cough.  He is on supplemental oxygen.  There is been no bleeding that he is known.  He has had no hemoptysis.  He has had no rashes.  His labs today show an iron saturation of 30%.   His LDH is 435.  His white cell count is 5.8.  Hemoglobin 9.6.  Platelet count 202,000.  He does have mild renal insufficiency with a BUN of 26 creatinine 1.63.  Hopefully, the James Byrd will be able to help him out.  I would have to say that his performance status right now is probably ECOG 2, at best.    Medications:  Allergies as of 06/23/2022   No Known Allergies      Medication List        Accurate as of June 23, 2022  3:00 PM. If you have any questions, ask your nurse or doctor.          STOP taking these medications    morphine 15 MG 12 hr tablet Commonly known as: MS CONTIN Stopped by: [name], MD   OLANZapine 5 MG tablet Commonly known as: ZYPREXA Stopped by: [name], MD       TAKE these medications    acetaminophen 650 MG CR tablet Commonly known as: TYLENOL Take 1 tablet (650 mg total) by mouth every 8 (eight) hours as needed for pain.   amoxicillin-clavulanate 500-125 MG tablet Commonly known as: Augmentin Take 1 tablet by mouth 3 (three) times daily for 4 days.   augmented betamethasone dipropionate 0.05 % cream Commonly known as: DIPROLENE-AF Apply 1 application topically 2 (two) times daily as  needed (psoriasis).   cyanocobalamin 2000 MCG tablet Take 2,000 mcg by mouth daily. Vitamin b12   diclofenac sodium 1 % Gel Commonly known as: VOLTAREN Apply 2 g topically daily as needed (pain).   DULoxetine 20 MG capsule Commonly known as: CYMBALTA Take 20 mg by mouth daily.   fentaNYL 25 MCG/HR Commonly known as: [place] 1 patch onto the skin every 3 (three) days.   FISH OIL PO Take 1 capsule by mouth daily.   folic acid 1 MG tablet Commonly known as: FOLVITE Take 2 tablets (2 mg total) by mouth daily.   hydrocortisone 5 MG tablet Commonly known as: CORTEF Take 1 tablet (5 mg total) by mouth 3 (three) times daily.   leflunomide 10 MG tablet Commonly known as: ARAVA Take 2 tablets (20 mg total) by mouth  daily.   methylphenidate 10 MG tablet Commonly known as: Ritalin Take 1 tablet (10 mg total) by mouth daily after breakfast. Started by: [name], MD   multivitamin with minerals Tabs tablet Take 1 tablet by mouth daily.   ondansetron 8 MG disintegrating tablet Commonly known as: ZOFRAN-ODT Take 1 tablet (8 mg total) by mouth every 8 (eight) hours as needed for nausea or vomiting.   ondansetron 8 MG tablet Commonly known as: ZOFRAN Take 8 mg by mouth every 8 (eight) hours as needed.   OVER THE COUNTER MEDICATION Take 0.5 each by mouth as needed. CBD gummies   oxyCODONE-acetaminophen 10-325 MG tablet Commonly known as: PERCOCET Take 1 tablet by mouth every 4 (four) hours as needed for pain.   pantoprazole 40 MG tablet Commonly known as: PROTONIX Take 1 tablet (40 mg total) by mouth 2 (two) times daily.   pregabalin 75 MG capsule Commonly known as: LYRICA Take 1 capsule (75 mg total) by mouth 2 (two) times daily.   prochlorperazine 10 MG tablet Commonly known as: COMPAZINE Take 1 tablet (10 mg total) by mouth every 6 (six) hours as needed (Nausea or vomiting). Started by: Amelia Jo, RN   pyridOXINE 100 MG tablet Commonly known as: VITAMIN B6 Take 100 mg by mouth daily.   solifenacin 10 MG tablet Commonly known as: VESICARE Take 1 tablet (10 mg total) by mouth daily.   Testosterone 20.25 MG/ACT (1.62%) Gel   vitamin C 1000 MG tablet Take 1,000 mg by mouth daily.   Vitamin D 50 MCG (2000 UT) tablet Take 2,000 Units by mouth daily.   zinc gluconate 50 MG tablet Take 50 mg by mouth daily.        Allergies: No Known Allergies  Past Medical History, Surgical history, Social history, and Family History were reviewed and updated.  Review of Systems: Review of Systems  Constitutional: Negative.   HENT: Negative.    Eyes: Negative.   Respiratory: Negative.    Cardiovascular: Negative.   Gastrointestinal: Negative.   Genitourinary: Negative.    Musculoskeletal:  Positive for joint pain and myalgias.  Skin: Negative.   Neurological: Negative.   Endo/Heme/Allergies: Negative.   Psychiatric/Behavioral: Negative.      Physical Exam:  height is 6' (1.829 m) and weight is 281 lb (127.5 kg). His oral temperature is 97.4 F (36.3 C) (abnormal). His blood pressure is 112/74 and his pulse is 80. His respiration is 19 and oxygen saturation is 97%.   Wt Readings from Last 3 Encounters:  06/23/22 281 lb (127.5 kg)  06/17/22 285 lb 15 oz (129.7 kg)  05/25/22 281 lb 3.2 oz (127.6 kg)  Physical Exam Vitals reviewed.  HENT:     Head: Normocephalic and atraumatic.  Eyes:     Pupils: Pupils are equal, round, and reactive to light.  Cardiovascular:     Rate and Rhythm: Normal rate and regular rhythm.     Heart sounds: Normal heart sounds.  Pulmonary:     Effort: Pulmonary effort is normal.     Breath sounds: Normal breath sounds.  Abdominal:     General: Bowel sounds are normal.     Palpations: Abdomen is soft.  Musculoskeletal:        General: No tenderness or deformity. Normal range of motion.     Cervical back: Normal range of motion.  Lymphadenopathy:     Cervical: No cervical adenopathy.  Skin:    General: Skin is warm and dry.     Findings: No erythema or rash.  Neurological:     Mental Status: He is alert and oriented to person, place, and time.  Psychiatric:        Behavior: Behavior normal.        Thought Content: Thought content normal.        Judgment: Judgment normal.      Lab Results  Component Value Date   WBC 5.8 06/23/2022   HGB 9.6 (L) 06/23/2022   HCT 29.8 (L) 06/23/2022   MCV 96.8 06/23/2022   PLT 202 06/23/2022   Lab Results  Component Value Date   FERRITIN 1,424 (H) 03/23/2022   IRON 52 06/23/2022   TIBC 172 (L) 06/23/2022   UIBC 120 06/23/2022   IRONPCTSAT 30 06/23/2022   Lab Results  Component Value Date   RETICCTPCT 1.6 11/27/2019   RBC 3.08 (L) 06/23/2022   No results found  for: "KPAFRELGTCHN", "LAMBDASER", "KAPLAMBRATIO" Lab Results  Component Value Date   IGGSERUM 1,046 03/25/2020   IGMSERUM 64 03/25/2020   Lab Results  Component Value Date   ALBUMINELP 4.3 03/25/2020   A1GS 0.3 03/25/2020   A2GS 0.7 03/25/2020   BETS 0.4 03/25/2020   BETA2SER 0.3 03/25/2020   GAMS 0.9 03/25/2020   SPEI  03/25/2020     Comment:     Normal Serum Protein Electrophoresis Pattern. No abnormal protein bands (M-protein) detected.      Chemistry      Component Value Date/Time   NA 140 06/23/2022 0812   NA 138 06/04/2020 1003   NA 145 06/06/2017 1316   NA 141 10/16/2015 1334   K 4.1 06/23/2022 0812   K 4.1 06/06/2017 1316   K 3.9 10/16/2015 1334   CL 100 06/23/2022 0812   CL 108 06/06/2017 1316   CO2 31 06/23/2022 0812   CO2 26 06/06/2017 1316   CO2 21 (L) 10/16/2015 1334   BUN 26 (H) 06/23/2022 0812   BUN 17 06/04/2020 1003   BUN 19 06/06/2017 1316   BUN 20.0 10/16/2015 1334   CREATININE 1.63 (H) 06/23/2022 0812   CREATININE 0.79 11/03/2021 1528   CREATININE 1.0 10/16/2015 1334      Component Value Date/Time   CALCIUM 9.0 06/23/2022 0812   CALCIUM 9.4 06/06/2017 1316   CALCIUM 9.7 10/16/2015 1334   ALKPHOS 47 06/23/2022 0812   ALKPHOS 78 06/06/2017 1316   ALKPHOS 62 10/16/2015 1334   AST 20 06/23/2022 0812   AST 16 10/16/2015 1334   ALT 20 06/23/2022 0812   ALT 27 06/06/2017 1316   ALT 11 10/16/2015 1334   BILITOT 0.5 06/23/2022 0812   BILITOT 0.49 10/16/2015 1334  Impression and Plan: Mr. Gassett is a very nice  57 yo caucasian gentleman with history of stage IIIB melanoma of the right hallux that was subungual with one microscopic positive inguinal lymph node. He completed 3 cycles of Yervoy but stopped due to side effects.   He has had multiple recurrences.  He has had radiation for these.  He has had treatment for these.  He ultimately has had surgery to try to resect out what has recurred in his abdomen.  He is having a lot of pain and  that was a motivation behind the surgery.  He then was treated with dacarbazine.  He progressed on dacarbazine.  He now is on Tafinlar.  Again, I really hope that he responds.  Clearly, his prognosis is not that great.  He has really been through quite a lot.  He really has done remarkably well with his melanoma.  I just hate the fact that we are beginning to run out of options for him.  I would like to see him back in about a week.  I really would like to make sure we follow-up closely with him.  Things can change very quickly with respect to his melanoma and his overall performance status will want to make sure that we are available to be able to support him and his wife.   [name], MD 1/4/20243:00 PM

## 2022-06-24 ENCOUNTER — Ambulatory Visit: Payer: PPO

## 2022-06-28 DIAGNOSIS — J158 Pneumonia due to other specified bacteria: Secondary | ICD-10-CM | POA: Diagnosis not present

## 2022-06-28 DIAGNOSIS — Z9981 Dependence on supplemental oxygen: Secondary | ICD-10-CM | POA: Diagnosis not present

## 2022-06-28 DIAGNOSIS — N3942 Incontinence without sensory awareness: Secondary | ICD-10-CM | POA: Diagnosis not present

## 2022-06-28 DIAGNOSIS — G63 Polyneuropathy in diseases classified elsewhere: Secondary | ICD-10-CM | POA: Diagnosis not present

## 2022-06-28 DIAGNOSIS — I9589 Other hypotension: Secondary | ICD-10-CM | POA: Diagnosis not present

## 2022-06-28 DIAGNOSIS — C4371 Malignant melanoma of right lower limb, including hip: Secondary | ICD-10-CM | POA: Diagnosis not present

## 2022-06-28 DIAGNOSIS — N318 Other neuromuscular dysfunction of bladder: Secondary | ICD-10-CM | POA: Diagnosis not present

## 2022-06-28 DIAGNOSIS — F331 Major depressive disorder, recurrent, moderate: Secondary | ICD-10-CM | POA: Diagnosis not present

## 2022-06-28 DIAGNOSIS — C774 Secondary and unspecified malignant neoplasm of inguinal and lower limb lymph nodes: Secondary | ICD-10-CM | POA: Diagnosis not present

## 2022-06-28 DIAGNOSIS — J9611 Chronic respiratory failure with hypoxia: Secondary | ICD-10-CM | POA: Diagnosis not present

## 2022-06-28 DIAGNOSIS — E271 Primary adrenocortical insufficiency: Secondary | ICD-10-CM | POA: Diagnosis not present

## 2022-06-29 ENCOUNTER — Encounter: Payer: Self-pay | Admitting: Hematology & Oncology

## 2022-06-29 ENCOUNTER — Inpatient Hospital Stay (HOSPITAL_BASED_OUTPATIENT_CLINIC_OR_DEPARTMENT_OTHER): Payer: PPO | Admitting: Hematology & Oncology

## 2022-06-29 ENCOUNTER — Inpatient Hospital Stay: Payer: PPO

## 2022-06-29 VITALS — BP 114/74 | HR 84 | Temp 98.1°F | Resp 20 | Wt 276.0 lb

## 2022-06-29 DIAGNOSIS — N179 Acute kidney failure, unspecified: Secondary | ICD-10-CM | POA: Diagnosis not present

## 2022-06-29 DIAGNOSIS — C4371 Malignant melanoma of right lower limb, including hip: Secondary | ICD-10-CM | POA: Diagnosis not present

## 2022-06-29 DIAGNOSIS — C7A8 Other malignant neuroendocrine tumors: Secondary | ICD-10-CM | POA: Diagnosis not present

## 2022-06-29 LAB — CBC WITH DIFFERENTIAL (CANCER CENTER ONLY)
Abs Immature Granulocytes: 0.01 10*3/uL (ref 0.00–0.07)
Basophils Absolute: 0 10*3/uL (ref 0.0–0.1)
Basophils Relative: 1 %
Eosinophils Absolute: 0.1 10*3/uL (ref 0.0–0.5)
Eosinophils Relative: 1 %
HCT: 28.4 % — ABNORMAL LOW (ref 39.0–52.0)
Hemoglobin: 9 g/dL — ABNORMAL LOW (ref 13.0–17.0)
Immature Granulocytes: 0 %
Lymphocytes Relative: 6 %
Lymphs Abs: 0.3 10*3/uL — ABNORMAL LOW (ref 0.7–4.0)
MCH: 30.5 pg (ref 26.0–34.0)
MCHC: 31.7 g/dL (ref 30.0–36.0)
MCV: 96.3 fL (ref 80.0–100.0)
Monocytes Absolute: 0.3 10*3/uL (ref 0.1–1.0)
Monocytes Relative: 7 %
Neutro Abs: 3.7 10*3/uL (ref 1.7–7.7)
Neutrophils Relative %: 85 %
Platelet Count: 202 10*3/uL (ref 150–400)
RBC: 2.95 MIL/uL — ABNORMAL LOW (ref 4.22–5.81)
RDW: 14.4 % (ref 11.5–15.5)
WBC Count: 4.3 10*3/uL (ref 4.0–10.5)
nRBC: 0 % (ref 0.0–0.2)

## 2022-06-29 LAB — CMP (CANCER CENTER ONLY)
ALT: 17 U/L (ref 0–44)
AST: 23 U/L (ref 15–41)
Albumin: 3.7 g/dL (ref 3.5–5.0)
Alkaline Phosphatase: 51 U/L (ref 38–126)
Anion gap: 8 (ref 5–15)
BUN: 21 mg/dL — ABNORMAL HIGH (ref 6–20)
CO2: 34 mmol/L — ABNORMAL HIGH (ref 22–32)
Calcium: 9.3 mg/dL (ref 8.9–10.3)
Chloride: 95 mmol/L — ABNORMAL LOW (ref 98–111)
Creatinine: 2.35 mg/dL — ABNORMAL HIGH (ref 0.61–1.24)
GFR, Estimated: 32 mL/min — ABNORMAL LOW (ref >60)
Glucose, Bld: 98 mg/dL (ref 70–99)
Potassium: 4.3 mmol/L (ref 3.5–5.1)
Sodium: 137 mmol/L (ref 135–145)
Total Bilirubin: 0.5 mg/dL (ref 0.3–1.2)
Total Protein: 6.9 g/dL (ref 6.5–8.1)

## 2022-06-29 LAB — PREALBUMIN: Prealbumin: 12 mg/dL — ABNORMAL LOW (ref 18–38)

## 2022-06-29 LAB — SAMPLE TO BLOOD BANK

## 2022-06-29 LAB — LACTATE DEHYDROGENASE: LDH: 450 U/L — ABNORMAL HIGH (ref 98–192)

## 2022-06-29 NOTE — Progress Notes (Signed)
Hematology and Oncology Follow Up Visit  James Byrd [id number] Feb 09, 1966 57 y.o. 06/29/2022   Principle Diagnosis:  Stage IIIB ([id number]) subungual melanoma of the right hallux -- nodal recurrence in the RIGHT inguinal nodes -- BRAF wt RIGHT Common Iliac node recurrence Progression of melanoma-lymph node metastasis- 02/2021 Neuroendocrine carcinoma of the pancreas Iron def anemia   Past Therapy:        Yervoy-status post 3 cycles - discontinued in September 2016 secondary to toxicity Somatuline 120 mg IM q month -- started on 05/22/2019 - d/c on 08/2019 XRT to the RIGHT inguinal basin XRT to abdominal lymph nodes-completed 09/21/2021  Current Therapy:   Nivolumab 480 mg q month -- started on 03/19/2021, s/p cycle #4 IV Iron as indicated  Status post surgical resection of abdominal disease - 11/2021 SBRT-lung nodules -start on 01/25/2022 Dacarbazine - IV -s/p cycle #3 -- start on 01/26/2022  Tafinlar 2 mg po q day - start on 06/23/2022   Interim History:  James Byrd is here today for follow-up.  We saw him last week.  He is feeling a little bit better.  He still comes in wheelchair.  He is still on some oxygen.  He was started the [place].  He has had no problems with this to date.  He does not have as much pain overall in the left flank.  He was admitted to the hospital done [facility] I think around the Christmas holiday with a large perinephric bleed.  He has had no fever.  His appetite is picking up a little bit.  He has had no diarrhea.  There has been a little bit of leg swelling.  He has always had some chronic leg swelling.  The Duragesic patch seems to be working fairly well for him.  Currently, I would have to say that his performance status is probably ECOG 2.     Medications:  Allergies as of 06/29/2022   No Known Allergies      Medication List        Accurate as of June 29, 2022 11:35 AM. If you have any questions, ask your nurse or doctor.           acetaminophen 650 MG CR tablet Commonly known as: TYLENOL Take 1 tablet (650 mg total) by mouth every 8 (eight) hours as needed for pain.   augmented betamethasone dipropionate 0.05 % cream Commonly known as: DIPROLENE-AF Apply 1 application topically 2 (two) times daily as needed (psoriasis).   cyanocobalamin 2000 MCG tablet Take 2,000 mcg by mouth daily. Vitamin b12   diclofenac sodium 1 % Gel Commonly known as: VOLTAREN Apply 2 g topically daily as needed (pain).   DULoxetine 20 MG capsule Commonly known as: CYMBALTA Take 20 mg by mouth daily.   fentaNYL 25 MCG/HR Commonly known as: Wells 1 patch onto the skin every 3 (three) days.   FISH OIL PO Take 1 capsule by mouth daily.   folic acid 1 MG tablet Commonly known as: FOLVITE Take 2 tablets (2 mg total) by mouth daily.   hydrocortisone 5 MG tablet Commonly known as: CORTEF Take 1 tablet (5 mg total) by mouth 3 (three) times daily.   leflunomide 10 MG tablet Commonly known as: ARAVA Take 2 tablets (20 mg total) by mouth daily.   Mekinist 2 MG tablet Generic drug: trametinib dimethyl sulfoxide Take 2 mg by mouth daily. Take 1 hour before or 2 hours after a meal. Store refrigerated in original container.   methylphenidate  10 MG tablet Commonly known as: Ritalin Take 1 tablet (10 mg total) by mouth daily after breakfast.   multivitamin with minerals Tabs tablet Take 1 tablet by mouth daily.   ondansetron 8 MG disintegrating tablet Commonly known as: ZOFRAN-ODT Take 1 tablet (8 mg total) by mouth every 8 (eight) hours as needed for nausea or vomiting.   ondansetron 8 MG tablet Commonly known as: ZOFRAN Take 8 mg by mouth every 8 (eight) hours as needed.   OVER THE COUNTER MEDICATION Take 0.5 each by mouth as needed. CBD gummies   oxyCODONE-acetaminophen 10-325 MG tablet Commonly known as: PERCOCET Take 1 tablet by mouth every 4 (four) hours as needed for pain.   pantoprazole 40 MG  tablet Commonly known as: PROTONIX Take 1 tablet (40 mg total) by mouth 2 (two) times daily.   pregabalin 75 MG capsule Commonly known as: LYRICA Take 1 capsule (75 mg total) by mouth 2 (two) times daily.   prochlorperazine 10 MG tablet Commonly known as: COMPAZINE Take 1 tablet (10 mg total) by mouth every 6 (six) hours as needed (Nausea or vomiting).   pyridOXINE 100 MG tablet Commonly known as: VITAMIN B6 Take 100 mg by mouth daily.   solifenacin 10 MG tablet Commonly known as: VESICARE Take 1 tablet (10 mg total) by mouth daily.   Testosterone 20.25 MG/ACT (1.62%) Gel   vitamin C 1000 MG tablet Take 1,000 mg by mouth daily.   Vitamin D 50 MCG (2000 UT) tablet Take 2,000 Units by mouth daily.   zinc gluconate 50 MG tablet Take 50 mg by mouth daily.        Allergies: No Known Allergies  Past Medical History, Surgical history, Social history, and Family History were reviewed and updated.  Review of Systems: Review of Systems  Constitutional: Negative.   HENT: Negative.    Eyes: Negative.   Respiratory: Negative.    Cardiovascular: Negative.   Gastrointestinal: Negative.   Genitourinary: Negative.   Musculoskeletal:  Positive for joint pain and myalgias.  Skin: Negative.   Neurological: Negative.   Endo/Heme/Allergies: Negative.   Psychiatric/Behavioral: Negative.      Physical Exam:  weight is 276 lb 0.6 oz (125.2 kg). His oral temperature is 98.1 F (36.7 C). His blood pressure is 114/74 and his pulse is 84. His respiration is 20 and oxygen saturation is 98%.   Wt Readings from Last 3 Encounters:  06/29/22 276 lb 0.6 oz (125.2 kg)  06/23/22 281 lb (127.5 kg)  06/17/22 285 lb 15 oz (129.7 kg)    Physical Exam Vitals reviewed.  HENT:     Head: Normocephalic and atraumatic.  Eyes:     Pupils: Pupils are equal, round, and reactive to light.  Cardiovascular:     Rate and Rhythm: Normal rate and regular rhythm.     Heart sounds: Normal heart  sounds.  Pulmonary:     Effort: Pulmonary effort is normal.     Breath sounds: Normal breath sounds.  Abdominal:     General: Bowel sounds are normal.     Palpations: Abdomen is soft.  Musculoskeletal:        General: No tenderness or deformity. Normal range of motion.     Cervical back: Normal range of motion.  Lymphadenopathy:     Cervical: No cervical adenopathy.  Skin:    General: Skin is warm and dry.     Findings: No erythema or rash.  Neurological:     Mental Status: He is alert and  oriented to person, place, and time.  Psychiatric:        Behavior: Behavior normal.        Thought Content: Thought content normal.        Judgment: Judgment normal.     Lab Results  Component Value Date   WBC 4.3 06/29/2022   HGB 9.0 (L) 06/29/2022   HCT 28.4 (L) 06/29/2022   MCV 96.3 06/29/2022   PLT 202 06/29/2022   Lab Results  Component Value Date   FERRITIN 2,988 (H) 06/23/2022   IRON 52 06/23/2022   TIBC 172 (L) 06/23/2022   UIBC 120 06/23/2022   IRONPCTSAT 30 06/23/2022   Lab Results  Component Value Date   RETICCTPCT 1.6 11/27/2019   RBC 2.95 (L) 06/29/2022   No results found for: "KPAFRELGTCHN", "LAMBDASER", "KAPLAMBRATIO" Lab Results  Component Value Date   IGGSERUM 1,046 03/25/2020   IGMSERUM 64 03/25/2020   Lab Results  Component Value Date   ALBUMINELP 4.3 03/25/2020   A1GS 0.3 03/25/2020   A2GS 0.7 03/25/2020   BETS 0.4 03/25/2020   BETA2SER 0.3 03/25/2020   GAMS 0.9 03/25/2020   SPEI  03/25/2020     Comment:     Normal Serum Protein Electrophoresis Pattern. No abnormal protein bands (M-protein) detected.      Chemistry      Component Value Date/Time   NA 140 06/23/2022 0812   NA 138 06/04/2020 1003   NA 145 06/06/2017 1316   NA 141 10/16/2015 1334   K 4.1 06/23/2022 0812   K 4.1 06/06/2017 1316   K 3.9 10/16/2015 1334   CL 100 06/23/2022 0812   CL 108 06/06/2017 1316   CO2 31 06/23/2022 0812   CO2 26 06/06/2017 1316   CO2 21 (L)  10/16/2015 1334   BUN 26 (H) 06/23/2022 0812   BUN 17 06/04/2020 1003   BUN 19 06/06/2017 1316   BUN 20.0 10/16/2015 1334   CREATININE 1.63 (H) 06/23/2022 0812   CREATININE 0.79 11/03/2021 1528   CREATININE 1.0 10/16/2015 1334      Component Value Date/Time   CALCIUM 9.0 06/23/2022 0812   CALCIUM 9.4 06/06/2017 1316   CALCIUM 9.7 10/16/2015 1334   ALKPHOS 47 06/23/2022 0812   ALKPHOS 78 06/06/2017 1316   ALKPHOS 62 10/16/2015 1334   AST 20 06/23/2022 0812   AST 16 10/16/2015 1334   ALT 20 06/23/2022 0812   ALT 27 06/06/2017 1316   ALT 11 10/16/2015 1334   BILITOT 0.5 06/23/2022 0812   BILITOT 0.49 10/16/2015 1334       Impression and Plan: Mr. James Byrd is a very nice  57 yo caucasian gentleman with history of stage IIIB melanoma of the right hallux that was subungual with one microscopic positive inguinal lymph node. He completed 3 cycles of Yervoy but stopped due to side effects.   He has had multiple recurrences.  He has had radiation for these.  He has had treatment for these.  He ultimately has had surgery to try to resect out what has recurred in his abdomen.  He is having a lot of pain and that was a motivation behind the surgery.  He then was treated with dacarbazine.  He progressed on dacarbazine.  He now is on Tafinlar.  Again, I really hope that he responds.  Clearly, his prognosis is not that great.  He has really been through quite a lot.  He really has done remarkably well with his melanoma.  I just hate  the fact that we are beginning to run out of options for him.  I would like to see him back in about a week.  I really would like to make sure we follow-up closely with him.  Things can change very quickly with respect to his melanoma and his overall performance status will want to make sure that we are available to be able to support him and his wife.   [name], MD 1/10/202411:35 AM

## 2022-06-30 ENCOUNTER — Encounter: Payer: Self-pay | Admitting: Hematology & Oncology

## 2022-07-01 ENCOUNTER — Other Ambulatory Visit: Payer: Self-pay | Admitting: *Deleted

## 2022-07-01 ENCOUNTER — Telehealth: Payer: Self-pay | Admitting: *Deleted

## 2022-07-01 ENCOUNTER — Inpatient Hospital Stay: Payer: PPO

## 2022-07-01 VITALS — BP 110/70 | HR 71 | Temp 97.9°F | Resp 19

## 2022-07-01 DIAGNOSIS — C779 Secondary and unspecified malignant neoplasm of lymph node, unspecified: Secondary | ICD-10-CM

## 2022-07-01 DIAGNOSIS — C4371 Malignant melanoma of right lower limb, including hip: Secondary | ICD-10-CM | POA: Diagnosis not present

## 2022-07-01 DIAGNOSIS — E860 Dehydration: Secondary | ICD-10-CM

## 2022-07-01 DIAGNOSIS — C7A8 Other malignant neuroendocrine tumors: Secondary | ICD-10-CM

## 2022-07-01 LAB — CMP (CANCER CENTER ONLY)
ALT: 15 U/L (ref 0–44)
AST: 23 U/L (ref 15–41)
Albumin: 3.7 g/dL (ref 3.5–5.0)
Alkaline Phosphatase: 53 U/L (ref 38–126)
Anion gap: 9 (ref 5–15)
BUN: 20 mg/dL (ref 6–20)
CO2: 33 mmol/L — ABNORMAL HIGH (ref 22–32)
Calcium: 9.6 mg/dL (ref 8.9–10.3)
Chloride: 93 mmol/L — ABNORMAL LOW (ref 98–111)
Creatinine: 2.26 mg/dL — ABNORMAL HIGH (ref 0.61–1.24)
GFR, Estimated: 33 mL/min — ABNORMAL LOW (ref >60)
Glucose, Bld: 95 mg/dL (ref 70–99)
Potassium: 4.5 mmol/L (ref 3.5–5.1)
Sodium: 135 mmol/L (ref 135–145)
Total Bilirubin: 0.6 mg/dL (ref 0.3–1.2)
Total Protein: 7.1 g/dL (ref 6.5–8.1)

## 2022-07-01 LAB — SAMPLE TO BLOOD BANK

## 2022-07-01 LAB — CBC WITH DIFFERENTIAL (CANCER CENTER ONLY)
Abs Immature Granulocytes: 0.01 10*3/uL (ref 0.00–0.07)
Basophils Absolute: 0 10*3/uL (ref 0.0–0.1)
Basophils Relative: 0 %
Eosinophils Absolute: 0 10*3/uL (ref 0.0–0.5)
Eosinophils Relative: 1 %
HCT: 30.3 % — ABNORMAL LOW (ref 39.0–52.0)
Hemoglobin: 9.5 g/dL — ABNORMAL LOW (ref 13.0–17.0)
Immature Granulocytes: 0 %
Lymphocytes Relative: 6 %
Lymphs Abs: 0.3 10*3/uL — ABNORMAL LOW (ref 0.7–4.0)
MCH: 29.9 pg (ref 26.0–34.0)
MCHC: 31.4 g/dL (ref 30.0–36.0)
MCV: 95.3 fL (ref 80.0–100.0)
Monocytes Absolute: 0.5 10*3/uL (ref 0.1–1.0)
Monocytes Relative: 8 %
Neutro Abs: 5.1 10*3/uL (ref 1.7–7.7)
Neutrophils Relative %: 85 %
Platelet Count: 196 10*3/uL (ref 150–400)
RBC: 3.18 MIL/uL — ABNORMAL LOW (ref 4.22–5.81)
RDW: 14.5 % (ref 11.5–15.5)
WBC Count: 6.1 10*3/uL (ref 4.0–10.5)
nRBC: 0 % (ref 0.0–0.2)

## 2022-07-01 MED ORDER — SODIUM CHLORIDE 0.9 % IV SOLN: Freq: Once | INTRAVENOUS | Status: AC

## 2022-07-01 NOTE — Patient Instructions (Signed)
Dehydration, Adult Dehydration is condition in which there is not enough water or other fluids in the body. This happens when a person loses more fluids than he or she takes in. Important body parts cannot work right without the right amount of fluids. Any loss of fluids from the body can cause dehydration. Dehydration can be mild, worse, or very bad. It should be treated right away to keep it from getting very bad. What are the causes? This condition may be caused by: Conditions that cause loss of water or other fluids, such as: Watery poop (diarrhea). Vomiting. Sweating a lot. Peeing (urinating) a lot. Not drinking enough fluids, especially when you: Are ill. Are doing things that take a lot of energy to do. Other illnesses and conditions, such as fever or infection. Certain medicines, such as medicines that take extra fluid out of the body (diuretics). Lack of safe drinking water. Not being able to get enough water and food. What increases the risk? The following factors may make you more likely to develop this condition: Having a long-term (chronic) illness that has not been treated the right way, such as: Diabetes. Heart disease. Kidney disease. Being 65 years of age or older. Having a disability. Living in a place that is high above the ground or sea (high in altitude). The thinner, dried air causes more fluid loss. Doing exercises that put stress on your body for a long time. What are the signs or symptoms? Symptoms of dehydration depend on how bad it is. Mild or worse dehydration Thirst. Dry lips or dry mouth. Feeling dizzy or light-headed, especially when you stand up from sitting. Muscle cramps. Your body making: Dark pee (urine). Pee may be the color of tea. Less pee than normal. Less tears than normal. Headache. Very bad dehydration Changes in skin. Skin may: Be cold to the touch (clammy). Be blotchy or pale. Not go back to normal right after you lightly pinch  it and let it go. Little or no tears, pee, or sweat. Changes in vital signs, such as: Fast breathing. Low blood pressure. Weak pulse. Pulse that is more than 100 beats a minute when you are sitting still. Other changes, such as: Feeling very thirsty. Eyes that look hollow (sunken). Cold hands and feet. Being mixed up (confused). Being very tired (lethargic) or having trouble waking from sleep. Short-term weight loss. Loss of consciousness. How is this treated? Treatment for this condition depends on how bad it is. Treatment should start right away. Do not wait until your condition gets very bad. Very bad dehydration is an emergency. You will need to go to a hospital. Mild or worse dehydration can be treated at home. You may be asked to: Drink more fluids. Drink an oral rehydration solution (ORS). This drink helps get the right amounts of fluids and salts and minerals in the blood (electrolytes). Very bad dehydration can be treated: With fluids through an IV tube. By getting normal levels of salts and minerals in your blood. This is often done by giving salts and minerals through a tube. The tube is passed through your nose and into your stomach. By treating the root cause. Follow these instructions at home: Oral rehydration solution If told by your doctor, drink an ORS: Make an ORS. Use instructions on the package. Start by drinking small amounts, about  cup (120 mL) every 5-10 minutes. Slowly drink more until you have had the amount that your doctor said to have. Eating and drinking          Drink enough clear fluid to keep your pee pale yellow. If you were told to drink an ORS, finish the ORS first. Then, start slowly drinking other clear fluids. Drink fluids such as: Water. Do not drink only water. Doing that can make the salt (sodium) level in your body get too low. Water from ice chips you suck on. Fruit juice that you have added water to (diluted). Low-calorie sports  drinks. Eat foods that have the right amounts of salts and minerals, such as: Bananas. Oranges. Potatoes. Tomatoes. Spinach. Do not drink alcohol. Avoid: Drinks that have a lot of sugar. These include: High-calorie sports drinks. Fruit juice that you did not add water to. Soda. Caffeine. Foods that are greasy or have a lot of fat or sugar. General instructions Take over-the-counter and prescription medicines only as told by your doctor. Do not take salt tablets. Doing that can make the salt level in your body get too high. Return to your normal activities as told by your doctor. Ask your doctor what activities are safe for you. Keep all follow-up visits as told by your doctor. This is important. Contact a doctor if: You have pain in your belly (abdomen) and the pain: Gets worse. Stays in one place. You have a rash. You have a stiff neck. You get angry or annoyed (irritable) more easily than normal. You are more tired or have a harder time waking than normal. You feel: Weak or dizzy. Very thirsty. Get help right away if you have: Any symptoms of very bad dehydration. Symptoms of vomiting, such as: You cannot eat or drink without vomiting. Your vomiting gets worse or does not go away. Your vomit has blood or green stuff in it. Symptoms that get worse with treatment. A fever. A very bad headache. Problems with peeing or pooping (having a bowel movement), such as: Watery poop that gets worse or does not go away. Blood in your poop (stool). This may cause poop to look black and tarry. Not peeing in 6-8 hours. Peeing only a small amount of very dark pee in 6-8 hours. Trouble breathing. These symptoms may be an emergency. Do not wait to see if the symptoms will go away. Get medical help right away. Call your local emergency services (911 in the [place] Do not drive yourself to the hospital. Summary Dehydration is a condition in which there is not enough water or other fluids  in the body. This happens when a person loses more fluids than he or she takes in. Treatment for this condition depends on how bad it is. Treatment should be started right away. Do not wait until your condition gets very bad. Drink enough clear fluid to keep your pee pale yellow. If you were told to drink an oral rehydration solution (ORS), finish the ORS first. Then, start slowly drinking other clear fluids. Take over-the-counter and prescription medicines only as told by your doctor. Get help right away if you have any symptoms of very bad dehydration. This information is not intended to replace advice given to you by your health care provider. Make sure you discuss any questions you have with your health care provider. Document Revised: 10/13/2021 Document Reviewed: 01/17/2019 Elsevier Patient Education  2023 [employer]  

## 2022-07-01 NOTE — Telephone Encounter (Signed)
Message received from patient's wife Hoyle Sauer stating that pt started to have diarrhea and vomiting last night and would like to know if pt can come in for IVF's today.  Dr. Marin Olp notified.  Call placed back to [facility] notified per order of Dr. Marin Olp to bring pt in now for labs and IVF's.  Hoyle Sauer is appreciative of call back and states that they can be here at 11:30AM.  Message sent to scheduling.

## 2022-07-06 ENCOUNTER — Inpatient Hospital Stay (HOSPITAL_BASED_OUTPATIENT_CLINIC_OR_DEPARTMENT_OTHER): Payer: PPO | Admitting: Hematology & Oncology

## 2022-07-06 ENCOUNTER — Inpatient Hospital Stay: Payer: PPO

## 2022-07-06 ENCOUNTER — Encounter: Payer: Self-pay | Admitting: Hematology & Oncology

## 2022-07-06 ENCOUNTER — Telehealth: Payer: Self-pay

## 2022-07-06 ENCOUNTER — Other Ambulatory Visit: Payer: Self-pay

## 2022-07-06 ENCOUNTER — Other Ambulatory Visit: Payer: Self-pay | Admitting: Oncology

## 2022-07-06 DIAGNOSIS — C4371 Malignant melanoma of right lower limb, including hip: Secondary | ICD-10-CM | POA: Diagnosis not present

## 2022-07-06 DIAGNOSIS — E2740 Unspecified adrenocortical insufficiency: Secondary | ICD-10-CM | POA: Diagnosis not present

## 2022-07-06 DIAGNOSIS — C779 Secondary and unspecified malignant neoplasm of lymph node, unspecified: Secondary | ICD-10-CM

## 2022-07-06 DIAGNOSIS — C7A8 Other malignant neuroendocrine tumors: Secondary | ICD-10-CM

## 2022-07-06 DIAGNOSIS — D696 Thrombocytopenia, unspecified: Secondary | ICD-10-CM

## 2022-07-06 DIAGNOSIS — J159 Unspecified bacterial pneumonia: Secondary | ICD-10-CM

## 2022-07-06 DIAGNOSIS — J189 Pneumonia, unspecified organism: Secondary | ICD-10-CM

## 2022-07-06 LAB — CBC WITH DIFFERENTIAL (CANCER CENTER ONLY)
Abs Immature Granulocytes: 0.01 10*3/uL (ref 0.00–0.07)
Basophils Absolute: 0 10*3/uL (ref 0.0–0.1)
Basophils Relative: 0 %
Eosinophils Absolute: 0 10*3/uL (ref 0.0–0.5)
Eosinophils Relative: 1 %
HCT: 31.1 % — ABNORMAL LOW (ref 39.0–52.0)
Hemoglobin: 9.8 g/dL — ABNORMAL LOW (ref 13.0–17.0)
Immature Granulocytes: 0 %
Lymphocytes Relative: 11 %
Lymphs Abs: 0.4 10*3/uL — ABNORMAL LOW (ref 0.7–4.0)
MCH: 29.5 pg (ref 26.0–34.0)
MCHC: 31.5 g/dL (ref 30.0–36.0)
MCV: 93.7 fL (ref 80.0–100.0)
Monocytes Absolute: 0.6 10*3/uL (ref 0.1–1.0)
Monocytes Relative: 17 %
Neutro Abs: 2.6 10*3/uL (ref 1.7–7.7)
Neutrophils Relative %: 71 %
Platelet Count: 194 10*3/uL (ref 150–400)
RBC: 3.32 MIL/uL — ABNORMAL LOW (ref 4.22–5.81)
RDW: 14.9 % (ref 11.5–15.5)
WBC Count: 3.7 10*3/uL — ABNORMAL LOW (ref 4.0–10.5)
nRBC: 0 % (ref 0.0–0.2)

## 2022-07-06 LAB — PREALBUMIN: Prealbumin: 15 mg/dL — ABNORMAL LOW (ref 18–38)

## 2022-07-06 LAB — CMP (CANCER CENTER ONLY)
ALT: 32 U/L (ref 0–44)
AST: 41 U/L (ref 15–41)
Albumin: 3.6 g/dL (ref 3.5–5.0)
Alkaline Phosphatase: 52 U/L (ref 38–126)
Anion gap: 10 (ref 5–15)
BUN: 19 mg/dL (ref 6–20)
CO2: 32 mmol/L (ref 22–32)
Calcium: 9.6 mg/dL (ref 8.9–10.3)
Chloride: 95 mmol/L — ABNORMAL LOW (ref 98–111)
Creatinine: 2.05 mg/dL — ABNORMAL HIGH (ref 0.61–1.24)
GFR, Estimated: 37 mL/min — ABNORMAL LOW (ref >60)
Glucose, Bld: 103 mg/dL — ABNORMAL HIGH (ref 70–99)
Potassium: 4.1 mmol/L (ref 3.5–5.1)
Sodium: 137 mmol/L (ref 135–145)
Total Bilirubin: 0.5 mg/dL (ref 0.3–1.2)
Total Protein: 7 g/dL (ref 6.5–8.1)

## 2022-07-06 LAB — SAMPLE TO BLOOD BANK

## 2022-07-06 LAB — LACTATE DEHYDROGENASE: LDH: 347 U/L — ABNORMAL HIGH (ref 98–192)

## 2022-07-06 LAB — MAGNESIUM: Magnesium: 1.7 mg/dL (ref 1.7–2.4)

## 2022-07-06 MED ORDER — ONDANSETRON 8 MG PO TBDP: 8.0000 mg | ORAL_TABLET | Freq: Three times a day (TID) | ORAL | 3 refills | Status: AC | PRN

## 2022-07-06 MED ORDER — SODIUM CHLORIDE 0.9 % IV SOLN: 8.0000 mg | Freq: Once | INTRAVENOUS | Status: DC

## 2022-07-06 MED ORDER — FENTANYL 25 MCG/HR TD PT72: 1.0000 | MEDICATED_PATCH | TRANSDERMAL | 0 refills | Status: AC

## 2022-07-06 MED ORDER — ONDANSETRON 8 MG PO TBDP: 8.0000 mg | ORAL_TABLET | Freq: Three times a day (TID) | ORAL | 0 refills | Status: DC | PRN

## 2022-07-06 MED ORDER — SODIUM CHLORIDE 0.9 % IV SOLN: INTRAVENOUS | Status: AC

## 2022-07-06 MED ORDER — SODIUM CHLORIDE 0.9 % IV SOLN
40.0000 mg | Freq: Once | INTRAVENOUS | Status: AC
  Administered 2022-07-06: 40 mg via INTRAVENOUS
  Filled 2022-07-06: qty 4

## 2022-07-06 MED ORDER — PROMETHAZINE HCL 25 MG RE SUPP: 25.0000 mg | Freq: Four times a day (QID) | RECTAL | 0 refills | Status: AC | PRN

## 2022-07-06 MED ORDER — ONDANSETRON HCL 4 MG/2ML IJ SOLN
8.0000 mg | Freq: Once | INTRAMUSCULAR | Status: AC
  Administered 2022-07-06: 8 mg via INTRAVENOUS
  Filled 2022-07-06: qty 4

## 2022-07-06 MED ORDER — PANTOPRAZOLE SODIUM 40 MG PO TBEC: 40.0000 mg | DELAYED_RELEASE_TABLET | Freq: Two times a day (BID) | ORAL | 4 refills | Status: AC

## 2022-07-06 NOTE — Progress Notes (Signed)
Hematology and Oncology Follow Up Visit  Kiko Ripp [id number] 1965/07/04 57 y.o. 07/06/2022   Principle Diagnosis:  Stage IIIB ([id number]) subungual melanoma of the right hallux -- nodal recurrence in the RIGHT inguinal nodes -- BRAF wt RIGHT Common Iliac node recurrence Progression of melanoma-lymph node metastasis- 02/2021 Neuroendocrine carcinoma of the pancreas Iron def anemia   Past Therapy:        Yervoy-status post 3 cycles - discontinued in September 2016 secondary to toxicity Somatuline 120 mg IM q month -- started on 05/22/2019 - d/c on 08/2019 XRT to the RIGHT inguinal basin XRT to abdominal lymph nodes-completed 09/21/2021  Current Therapy:   Nivolumab 480 mg q month -- started on 03/19/2021, s/p cycle #4 IV Iron as indicated  Status post surgical resection of abdominal disease - 11/2021 SBRT-lung nodules -start on 01/25/2022 Dacarbazine - IV -s/p cycle #3 -- start on 01/26/2022  Tafinlar 2 mg po q day - start on 06/23/2022   Interim History:  Mr. Haack is here today for an unscheduled visit.  His wife called saying that he was having a lot of nausea and vomiting.  This seems to happen when he takes the [place].  I told him to stop the defender for a week.  After a week, hopefully he can take 1 mg a day.  His quality of life is just not doing well right now.  I feel bad that his quality of life is suffering.  I really would like to see him feel little bit better.  I know he is trying hard.  He says that the Zofran ODT works well.  We will go ahead and call this in for him.  I think we might want to call in some Phenergan suppositories for him.  He is not taking any antiacid.  He was given put on Protonix.  I think Protonix is going to be helpful for him.  He will take this twice a day.  His labs all look too bad today.  His white cell count 3.7.  Hemoglobin 9.8.  Platelet count 194,000.  The sodium 137.  Potassium 4.1.  BUN 19 creatinine 2.05.  Calcium is 9.6 with an  albumin of 3.6.  His last prealbumin a week ago was 12.  Again, the problem might be the Tafinlar.  We will have him hold the defendant for 1 week.  After week, then we can see about restarting this at 1 mg a day.  If he is develops nausea and vomiting with this, then I would stop the Tafinlar.  Again, there is no bleeding.  The abdominal pain where he had the perinephric hematoma over on the left side seems to be improving.  The fact that his hemoglobin is trending upward also is quite encouraging.  Currently, I would say his performance status is probably ECOG 2, at best.  Medications:  Allergies as of 07/06/2022   No Known Allergies      Medication List        Accurate as of July 06, 2022  3:50 PM. If you have any questions, ask your nurse or doctor.          STOP taking these medications    FISH OIL PO Stopped by: [name], MD   morphine 15 MG 12 hr tablet Commonly known as: MS CONTIN Stopped by: [name], MD   ondansetron 8 MG tablet Commonly known as: ZOFRAN Stopped by: [name], MD   pyridOXINE 100 MG tablet  Commonly known as: VITAMIN B6 Stopped by: [name], MD   Testosterone 20.25 MG/ACT (1.62%) Gel Stopped by: [name], MD       TAKE these medications    acetaminophen 650 MG CR tablet Commonly known as: TYLENOL Take 1 tablet (650 mg total) by mouth every 8 (eight) hours as needed for pain.   augmented betamethasone dipropionate 0.05 % cream Commonly known as: DIPROLENE-AF Apply 1 application topically 2 (two) times daily as needed (psoriasis).   cyanocobalamin 2000 MCG tablet Take 2,000 mcg by mouth daily. Vitamin b12   diclofenac sodium 1 % Gel Commonly known as: VOLTAREN Apply 2 g topically daily as needed (pain).   DULoxetine 20 MG capsule Commonly known as: CYMBALTA Take 20 mg by mouth daily.   fentaNYL 25 MCG/HR Commonly known as: Robinson 1 patch onto the skin every 3 (three) days.    folic acid 1 MG tablet Commonly known as: FOLVITE Take 2 tablets (2 mg total) by mouth daily.   hydrocortisone 5 MG tablet Commonly known as: CORTEF Take 1 tablet (5 mg total) by mouth 3 (three) times daily.   leflunomide 10 MG tablet Commonly known as: ARAVA Take 2 tablets (20 mg total) by mouth daily.   [place] 2 MG tablet Generic drug: trametinib dimethyl sulfoxide Take 2 mg by mouth daily. Take 1 hour before or 2 hours after a meal. Store refrigerated in original container.   methylphenidate 10 MG tablet Commonly known as: Ritalin Take 1 tablet (10 mg total) by mouth daily after breakfast.   multivitamin with minerals Tabs tablet Take 1 tablet by mouth daily.   ondansetron 8 MG disintegrating tablet Commonly known as: ZOFRAN-ODT Take 1 tablet (8 mg total) by mouth every 8 (eight) hours as needed for nausea or vomiting.   OVER THE COUNTER MEDICATION Take 0.5 each by mouth as needed. CBD gummies   oxyCODONE-acetaminophen 10-325 MG tablet Commonly known as: PERCOCET Take 1 tablet by mouth every 4 (four) hours as needed for pain.   pantoprazole 40 MG tablet Commonly known as: PROTONIX Take 1 tablet (40 mg total) by mouth 2 (two) times daily.   pregabalin 75 MG capsule Commonly known as: LYRICA Take 1 capsule (75 mg total) by mouth 2 (two) times daily.   prochlorperazine 10 MG tablet Commonly known as: COMPAZINE Take 1 tablet (10 mg total) by mouth every 6 (six) hours as needed (Nausea or vomiting).   promethazine 25 MG suppository Commonly known as: PHENERGAN Place 1 suppository (25 mg total) rectally every 6 (six) hours as needed for nausea or vomiting. Started by: [name], MD   solifenacin 10 MG tablet Commonly known as: VESICARE Take 1 tablet (10 mg total) by mouth daily.   vitamin C 1000 MG tablet Take 1,000 mg by mouth daily.   Vitamin D 50 MCG (2000 UT) tablet Take 2,000 Units by mouth daily.   zinc gluconate 50 MG tablet Take 50 mg by  mouth daily.        Allergies: No Known Allergies  Past Medical History, Surgical history, Social history, and Family History were reviewed and updated.  Review of Systems: Review of Systems  Constitutional: Negative.   HENT: Negative.    Eyes: Negative.   Respiratory: Negative.    Cardiovascular: Negative.   Gastrointestinal: Negative.   Genitourinary: Negative.   Musculoskeletal:  Positive for joint pain and myalgias.  Skin: Negative.   Neurological: Negative.   Endo/Heme/Allergies: Negative.   Psychiatric/Behavioral: Negative.  Physical Exam:  vitals were not taken for this visit.   Wt Readings from Last 3 Encounters:  06/29/22 276 lb 0.6 oz (125.2 kg)  06/23/22 281 lb (127.5 kg)  06/17/22 285 lb 15 oz (129.7 kg)    Physical Exam Vitals reviewed.  HENT:     Head: Normocephalic and atraumatic.  Eyes:     Pupils: Pupils are equal, round, and reactive to light.  Cardiovascular:     Rate and Rhythm: Normal rate and regular rhythm.     Heart sounds: Normal heart sounds.  Pulmonary:     Effort: Pulmonary effort is normal.     Breath sounds: Normal breath sounds.  Abdominal:     General: Bowel sounds are normal.     Palpations: Abdomen is soft.  Musculoskeletal:        General: No tenderness or deformity. Normal range of motion.     Cervical back: Normal range of motion.  Lymphadenopathy:     Cervical: No cervical adenopathy.  Skin:    General: Skin is warm and dry.     Findings: No erythema or rash.  Neurological:     Mental Status: He is alert and oriented to person, place, and time.  Psychiatric:        Behavior: Behavior normal.        Thought Content: Thought content normal.        Judgment: Judgment normal.      Lab Results  Component Value Date   WBC 3.7 (L) 07/06/2022   HGB 9.8 (L) 07/06/2022   HCT 31.1 (L) 07/06/2022   MCV 93.7 07/06/2022   PLT 194 07/06/2022   Lab Results  Component Value Date   FERRITIN 2,988 (H) 06/23/2022    IRON 52 06/23/2022   TIBC 172 (L) 06/23/2022   UIBC 120 06/23/2022   IRONPCTSAT 30 06/23/2022   Lab Results  Component Value Date   RETICCTPCT 1.6 11/27/2019   RBC 3.32 (L) 07/06/2022   No results found for: "KPAFRELGTCHN", "LAMBDASER", "KAPLAMBRATIO" Lab Results  Component Value Date   IGGSERUM 1,046 03/25/2020   IGMSERUM 64 03/25/2020   Lab Results  Component Value Date   ALBUMINELP 4.3 03/25/2020   A1GS 0.3 03/25/2020   A2GS 0.7 03/25/2020   BETS 0.4 03/25/2020   BETA2SER 0.3 03/25/2020   GAMS 0.9 03/25/2020   SPEI  03/25/2020     Comment:     Normal Serum Protein Electrophoresis Pattern. No abnormal protein bands (M-protein) detected.      Chemistry      Component Value Date/Time   NA 137 07/06/2022 1158   NA 138 06/04/2020 1003   NA 145 06/06/2017 1316   NA 141 10/16/2015 1334   K 4.1 07/06/2022 1158   K 4.1 06/06/2017 1316   K 3.9 10/16/2015 1334   CL 95 (L) 07/06/2022 1158   CL 108 06/06/2017 1316   CO2 32 07/06/2022 1158   CO2 26 06/06/2017 1316   CO2 21 (L) 10/16/2015 1334   BUN 19 07/06/2022 1158   BUN 17 06/04/2020 1003   BUN 19 06/06/2017 1316   BUN 20.0 10/16/2015 1334   CREATININE 2.05 (H) 07/06/2022 1158   CREATININE 0.79 11/03/2021 1528   CREATININE 1.0 10/16/2015 1334      Component Value Date/Time   CALCIUM 9.6 07/06/2022 1158   CALCIUM 9.4 06/06/2017 1316   CALCIUM 9.7 10/16/2015 1334   ALKPHOS 52 07/06/2022 1158   ALKPHOS 78 06/06/2017 1316   ALKPHOS 62 10/16/2015  1334   AST 41 07/06/2022 1158   AST 16 10/16/2015 1334   ALT 32 07/06/2022 1158   ALT 27 06/06/2017 1316   ALT 11 10/16/2015 1334   BILITOT 0.5 07/06/2022 1158   BILITOT 0.49 10/16/2015 1334       Impression and Plan: Mr. Hinch is a very nice  57 yo caucasian gentleman with history of stage IIIB melanoma of the right hallux that was subungual with one microscopic positive inguinal lymph node. He completed 3 cycles of Yervoy but stopped due to side effects.   He has  had multiple recurrences.  He has had radiation for these.  He has had treatment for these.  He ultimately has had surgery to try to resect out what has recurred in his abdomen.  He is having a lot of pain and that was a motivation behind the surgery.  He then was treated with dacarbazine.  He progressed on dacarbazine.  He now is on Tafinlar.  It is hard to say if the [place] is causing some of his side effects.  Again, is not going to her to stop the Tafinlar for 1 week.  I sent in some prescriptions for him.  Hopefully, these will help his quality of life and help him feel a little bit better.  We still have to watch him closely.  Will have to have him come back in 1 week.  Again he may need to come back sooner if he starts having problems at home again.     [name], MD 1/17/20243:50 PM

## 2022-07-06 NOTE — Telephone Encounter (Signed)
Received phone call from patient wife stating that James Byrd has had vomiting for the past 24 hours and unable to keep the zofran pill down. Pt wife stating that she thinks IVF and IV zofran may be beneficial to patient. Discussed patient symptoms with Dr. Marin Olp and he would like patient to be brought in for IVF and labs. Message sent to scheduling. Pt wife appreciative of call back and instructions and verbalized understanding. Per wife they will be here within the hour.

## 2022-07-06 NOTE — Patient Instructions (Signed)
Famotidine Solution for Injection What is this medication? FAMOTIDINE (fa MOE ti deen) treats stomach ulcers, reflux disease, or other conditions that cause too much stomach acid. It works by reducing the amount of acid in the stomach. This medicine may be used for other purposes; ask your health care provider or pharmacist if you have questions. COMMON BRAND NAME(S): Pepcid What should I tell my care team before I take this medication? They need to know if you have any of these conditions: Kidney or liver disease An unusual or allergic reaction to famotidine, other medications, foods, dyes, or preservatives Pregnant or trying to get pregnant Breast-feeding How should I use this medication? This medication is for infusion into a vein. It is given in a hospital or clinic setting. Talk to your care team regarding the use of this medication in children. Special care may be needed. Overdosage: If you think you have taken too much of this medicine contact a poison control center or emergency room at once. NOTE: This medicine is only for you. Do not share this medicine with others. What if I miss a dose? This does not apply. What may interact with this medication? Delavirdine Itraconazole Ketoconazole This list may not describe all possible interactions. Give your health care provider a list of all the medicines, herbs, non-prescription drugs, or dietary supplements you use. Also tell them if you smoke, drink alcohol, or use illegal drugs. Some items may interact with your medicine. What should I watch for while using this medication? Tell your doctor or health care professional if your condition does not start to get better or gets worse. Do not take with aspirin, ibuprofen, or other antiinflammatory medications. These can aggravate your condition. Do not smoke cigarettes or drink alcohol. These increase irritation in your stomach and can increase the time it will take for ulcers to heal.  Cigarettes and alcohol can also worsen acid reflux or heartburn. If you get black, tarry stools or vomit up what looks like coffee grounds, call your doctor or health care professional at once. You may have a bleeding ulcer. This medication may cause a decrease in vitamin B12. Make sure that you get enough vitamin B12 while you are taking this medication. Discuss the foods you eat and the vitamins you take with your care team. What side effects may I notice from receiving this medication? Side effects that you should report to your care team as soon as possible: Allergic reactions--skin rash, itching, hives, swelling of the face, lips, tongue, or throat Confusion Hallucinations Side effects that usually do not require medical attention (report to your care team if they continue or are bothersome): Constipation Diarrhea Dizziness Headache This list may not describe all possible side effects. Call your doctor for medical advice about side effects. You may report side effects to FDA at 1-800-FDA-1088. Where should I keep my medication? This medication is given in a hospital or clinic. You will not be given this medication to store at home. NOTE: This sheet is a summary. It may not cover all possible information. If you have questions about this medicine, talk to your doctor, pharmacist, or health care provider.  2023 Elsevier/Gold Standard (2020-06-09 00:00:00)  

## 2022-07-07 ENCOUNTER — Telehealth: Payer: Self-pay | Admitting: *Deleted

## 2022-07-07 DIAGNOSIS — Z515 Encounter for palliative care: Secondary | ICD-10-CM | POA: Diagnosis not present

## 2022-07-07 DIAGNOSIS — C439 Malignant melanoma of skin, unspecified: Secondary | ICD-10-CM | POA: Diagnosis not present

## 2022-07-07 NOTE — Telephone Encounter (Signed)
Call received from [facility] with [facility] stating that pt.'s daughter and wife called and would like for Hospice to come and evaluate pt. Ebony Hail stated that a NP with [facility] came to see pt today and recommended hospice to family and also placed a referral for hospice.  Dr. Marin Olp notified.  Ebony Hail notified that hospice is OK with Dr. Marin Olp and that Dr. Marin Olp would like to be pt.'s attending physician while on hospice.

## 2022-07-08 ENCOUNTER — Inpatient Hospital Stay: Payer: PPO | Admitting: Hematology & Oncology

## 2022-07-08 ENCOUNTER — Inpatient Hospital Stay: Payer: PPO

## 2022-07-08 ENCOUNTER — Telehealth: Payer: Self-pay | Admitting: *Deleted

## 2022-07-08 NOTE — Telephone Encounter (Signed)
Call received from pt.s wife James Byrd to notify Dr. Marin Olp that hospice came to see James Byrd this morning and that hospice believes he has days left to live.  Dr. Marin Olp notified.

## 2022-07-13 ENCOUNTER — Ambulatory Visit: Payer: PPO

## 2022-07-13 ENCOUNTER — Ambulatory Visit: Payer: PPO | Admitting: Hematology & Oncology

## 2022-07-13 ENCOUNTER — Inpatient Hospital Stay: Payer: PPO

## 2022-07-18 ENCOUNTER — Ambulatory Visit: Payer: Self-pay | Admitting: Radiation Oncology

## 2022-07-21 DEATH — deceased

## 2022-07-25 ENCOUNTER — Ambulatory Visit: Payer: PPO | Admitting: Radiation Oncology

## 2022-08-06 ENCOUNTER — Other Ambulatory Visit: Payer: Self-pay | Admitting: Hematology & Oncology

## 2023-04-25 ENCOUNTER — Other Ambulatory Visit (HOSPITAL_COMMUNITY): Payer: Self-pay

## 2023-09-02 IMAGING — US US SCROTUM W/ DOPPLER COMPLETE
1 series · 14 of 25 positions shown · non-contrast
Comparison: 07/14/2021 PET.

CLINICAL DATA: Left-sided testicular pain. History of recurrent
melanoma.

EXAM:
SCROTAL ULTRASOUND
DOPPLER ULTRASOUND OF THE TESTICLES
TECHNIQUE: Complete ultrasound examination of the testicles, epididymis, and
other scrotal structures was performed. Color and spectral Doppler
ultrasound were also utilized to evaluate blood flow to the
testicles.

[Series 1: us scrotum w/ doppler complete · 14 of 87 slices shown]
[im 1/87]
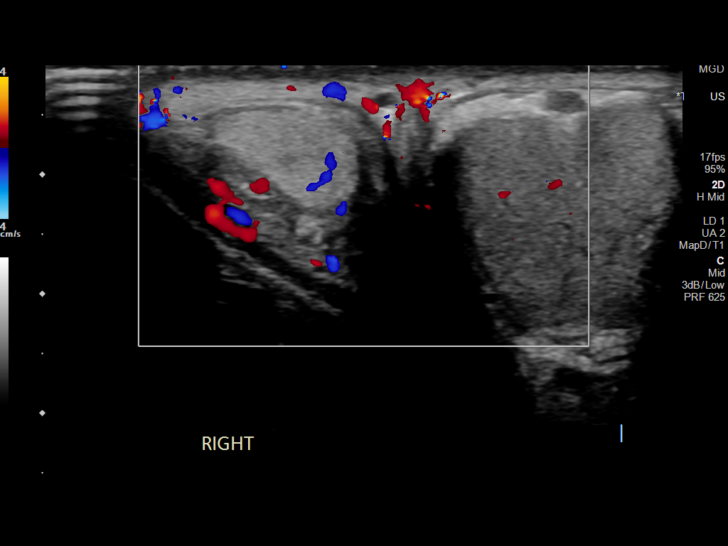
[im 8/87]
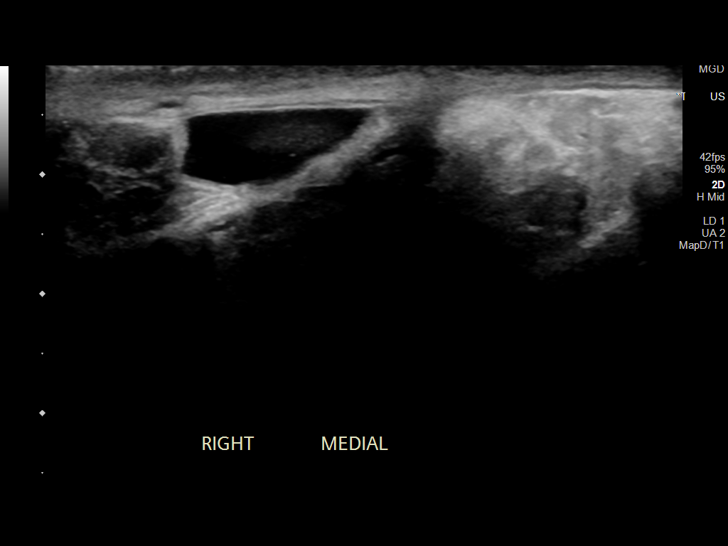
[im 15/87]
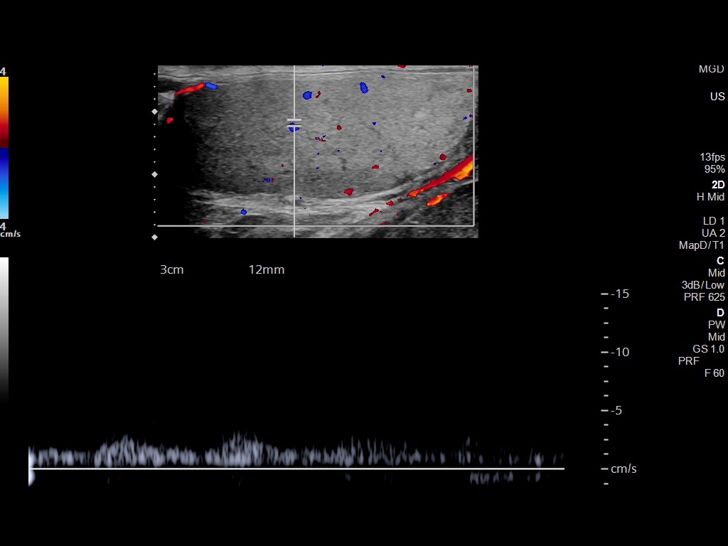
[im 22/87]
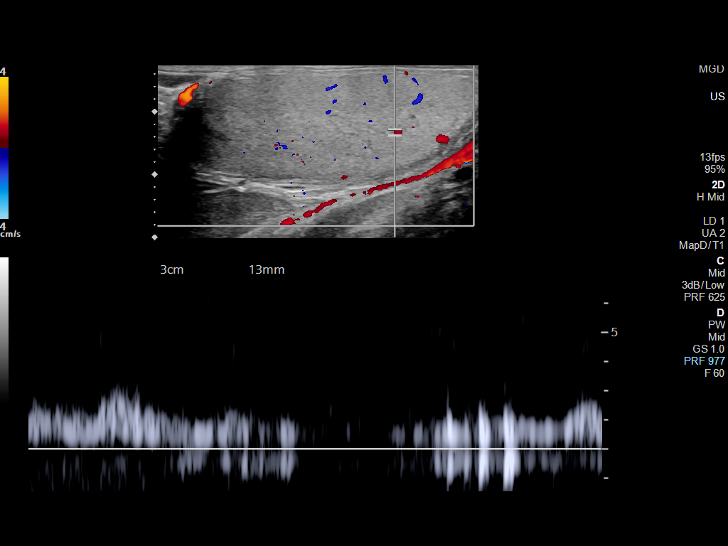
[im 29/87]
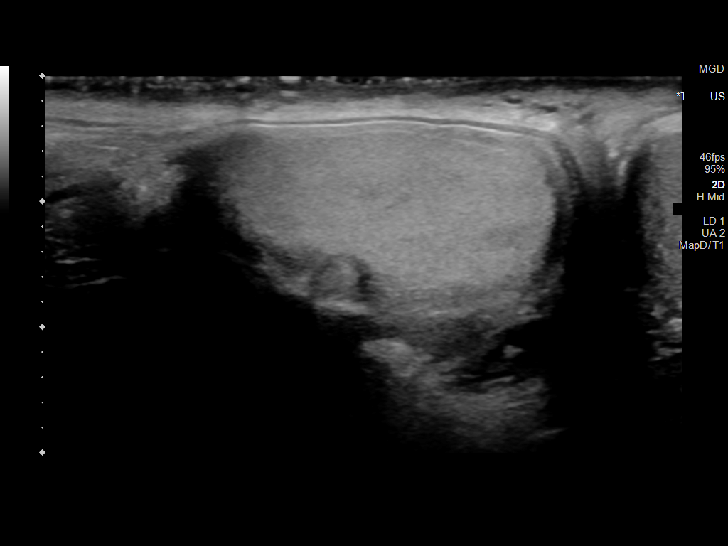
[im 33/87]
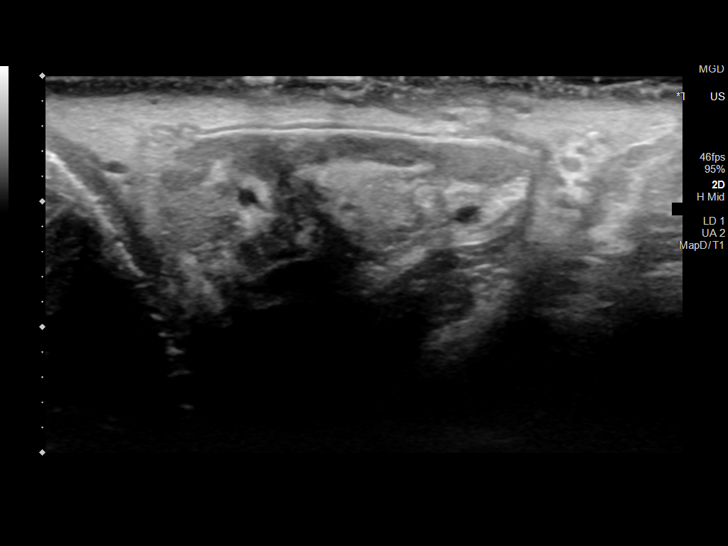
[im 40/87]
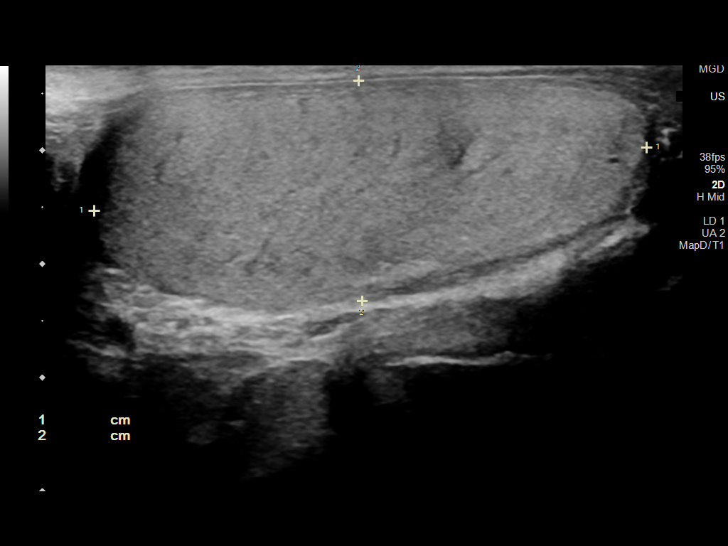
[im 47/87]
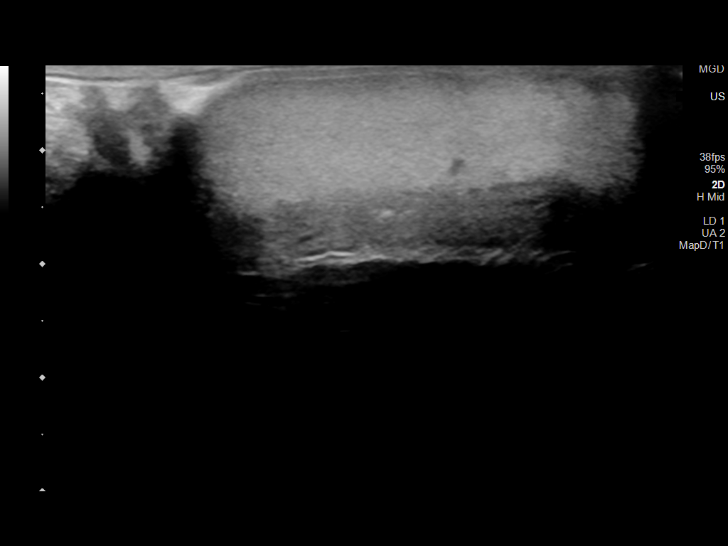
[im 54/87]
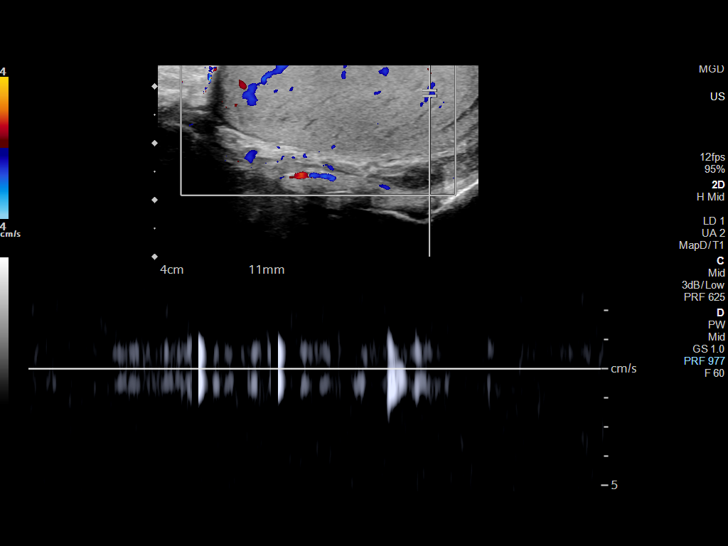
[im 58/87]
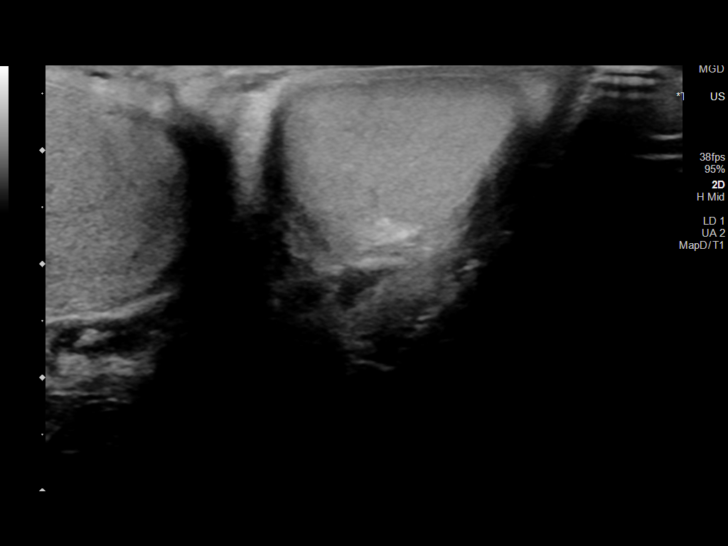
[im 65/87]
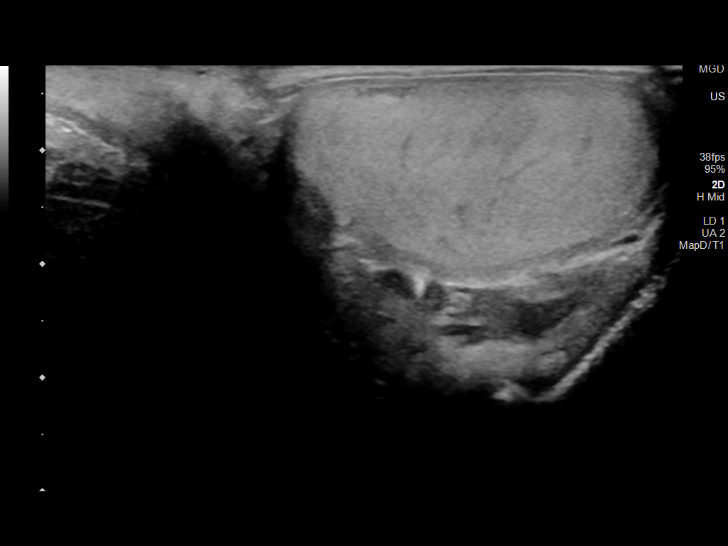
[im 72/87]
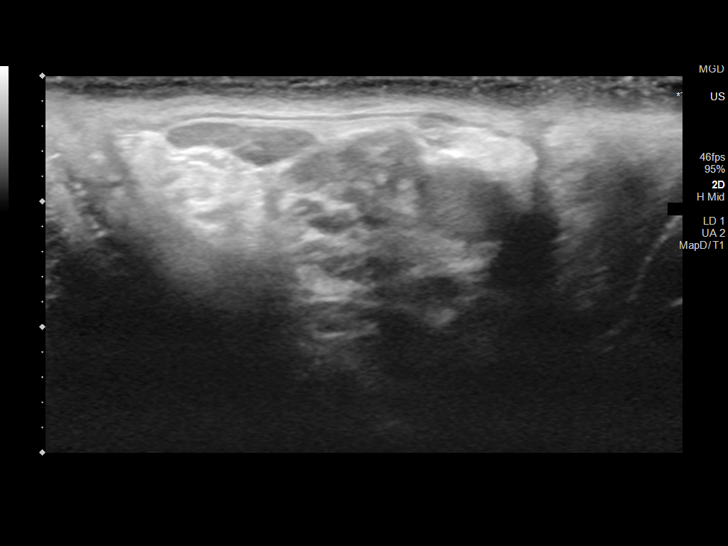
[im 79/87]
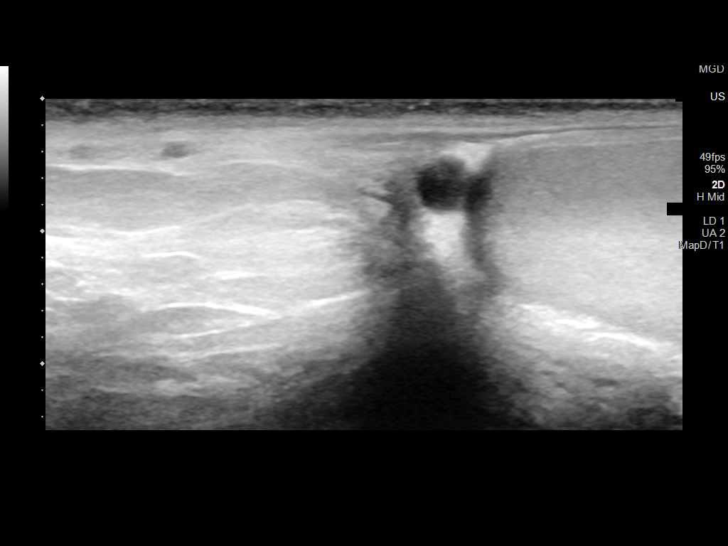
[im 87/87]
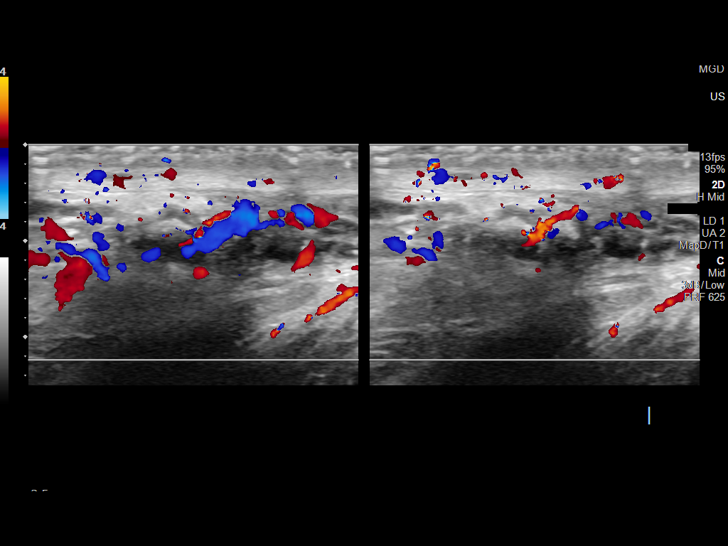

[14 of 25 positions shown; findings below may reference images not displayed]

FINDINGS: Right testicle

Measurements: 4.6 x 2.0 x 2.6 cm. No mass or microlithiasis
visualized.

Left testicle

Measurements: 4.8 x 1.9 x 2.6 cm. No mass or microlithiasis
visualized.

Right epididymis:  3 mm epididymal cyst versus spermatocele.

Left epididymis:  5 mm epididymal cyst versus spermatocele.

Hydrocele:  Trace right-sided scrotal fluid is likely physiologic.

Varicocele:  None visualized.

Pulsed Doppler interrogation of both testes demonstrates normal low
resistance arterial and venous waveforms bilaterally.
IMPRESSION: No evidence of epididymitis/orchitis or testicular torsion. No
explanation for left-sided pain.
